# Patient Record
Sex: Female | Born: 1965 | Race: White | Hispanic: No | Marital: Single | Smoking: Former smoker
Health system: Southern US, Community
[De-identification: ages and names within clinical notes are randomized; demographics above are authoritative.]

## PROBLEM LIST (undated history)

## (undated) DIAGNOSIS — K5790 Diverticulosis of intestine, part unspecified, without perforation or abscess without bleeding: Secondary | ICD-10-CM

## (undated) DIAGNOSIS — I5030 Unspecified diastolic (congestive) heart failure: Secondary | ICD-10-CM

## (undated) DIAGNOSIS — I509 Heart failure, unspecified: Secondary | ICD-10-CM

## (undated) DIAGNOSIS — Z9981 Dependence on supplemental oxygen: Secondary | ICD-10-CM

## (undated) DIAGNOSIS — E119 Type 2 diabetes mellitus without complications: Secondary | ICD-10-CM

## (undated) DIAGNOSIS — IMO0001 Reserved for inherently not codable concepts without codable children: Secondary | ICD-10-CM

## (undated) DIAGNOSIS — K635 Polyp of colon: Secondary | ICD-10-CM

## (undated) DIAGNOSIS — J45909 Unspecified asthma, uncomplicated: Secondary | ICD-10-CM

## (undated) DIAGNOSIS — K8020 Calculus of gallbladder without cholecystitis without obstruction: Secondary | ICD-10-CM

## (undated) DIAGNOSIS — I700 Atherosclerosis of aorta: Secondary | ICD-10-CM

## (undated) DIAGNOSIS — I10 Essential (primary) hypertension: Secondary | ICD-10-CM

## (undated) DIAGNOSIS — E785 Hyperlipidemia, unspecified: Secondary | ICD-10-CM

## (undated) DIAGNOSIS — Z6841 Body Mass Index (BMI) 40.0 and over, adult: Secondary | ICD-10-CM

## (undated) DIAGNOSIS — R918 Other nonspecific abnormal finding of lung field: Secondary | ICD-10-CM

## (undated) DIAGNOSIS — J449 Chronic obstructive pulmonary disease, unspecified: Secondary | ICD-10-CM

## (undated) DIAGNOSIS — I2510 Atherosclerotic heart disease of native coronary artery without angina pectoris: Secondary | ICD-10-CM

## (undated) DIAGNOSIS — Z7982 Long term (current) use of aspirin: Secondary | ICD-10-CM

## (undated) DIAGNOSIS — E662 Morbid (severe) obesity with alveolar hypoventilation: Secondary | ICD-10-CM

## (undated) DIAGNOSIS — K760 Fatty (change of) liver, not elsewhere classified: Secondary | ICD-10-CM

## (undated) HISTORY — DX: Type 2 diabetes mellitus without complications: E11.9

## (undated) HISTORY — DX: Hyperlipidemia, unspecified: E78.5

## (undated) HISTORY — DX: Body Mass Index (BMI) 40.0 and over, adult: Z684

## (undated) HISTORY — DX: Unspecified diastolic (congestive) heart failure: I50.30

## (undated) HISTORY — DX: Essential (primary) hypertension: I10

## (undated) HISTORY — DX: Polyp of colon: K63.5

## (undated) HISTORY — DX: Morbid (severe) obesity due to excess calories: E66.01

## (undated) HISTORY — PX: WISDOM TOOTH EXTRACTION: SHX21

## (undated) HISTORY — PX: BREAST BIOPSY: SHX20

---

## 2013-12-28 HISTORY — PX: OTHER SURGICAL HISTORY: SHX169

## 2014-01-18 ENCOUNTER — Ambulatory Visit (INDEPENDENT_AMBULATORY_CARE_PROVIDER_SITE_OTHER): Payer: Managed Care, Other (non HMO) | Admitting: General Surgery

## 2014-01-18 ENCOUNTER — Other Ambulatory Visit: Payer: Managed Care, Other (non HMO)

## 2014-01-18 ENCOUNTER — Encounter: Payer: Self-pay | Admitting: General Surgery

## 2014-01-18 VITALS — BP 174/84 | HR 92 | Resp 18 | Wt 291.0 lb

## 2014-01-18 DIAGNOSIS — R221 Localized swelling, mass and lump, neck: Secondary | ICD-10-CM

## 2014-01-18 DIAGNOSIS — R220 Localized swelling, mass and lump, head: Secondary | ICD-10-CM

## 2014-01-18 NOTE — Progress Notes (Addendum)
Patient ID: Martha Woodard, female   DOB: 02-29-1966, 48 y.o.   MRN: [id number]  Chief Complaint  Patient presents with  . Other    thyroid goiter    HPI Martha Woodard is a 48 y.o. female who presents for an evaluation of a thyroid goiter. The patient states she noticed knots approximately 1 year ago. The knots are located on the front and back of the neck. She denies any pain in these areas. She denies having problems with swallowing. It had gotten larger shortly after she noticed it but has stayed stable in the last year. She has a brother who has had his thyroid removed. Her mother has a history goiter. There is no history of head or neck radiation. The patient has appreciated no change in the texture of her hair. No changes in her voice.   The posterior neck nodules in the right anterior neck nodule were appreciated simultaneously. It was an incidental finding as the patient was rubbing her neck.  HPI  Past Medical History  Diagnosis Date  . Sinus problem     Past Surgical History  Procedure Laterality Date  . Wisdom tooth extraction      Family History  Problem Relation Age of Onset  . Cancer Mother 39    uterian and ovarian   . Goiter Mother   . Stroke Father   . COPD Sister   . Stroke Brother     Social History History  Substance Use Topics  . Smoking status: Former Smoker -- 33 years  . Smokeless tobacco: Not on file  . Alcohol Use: No    Allergies  Allergen Reactions  . Augmentin [Amoxicillin-Pot Clavulanate] Itching    Current Outpatient Prescriptions  Medication Sig Dispense Refill  . DiphenhydrAMINE HCl (ALLERGY MED PO) Take 1-2 capsules by mouth every 4 (four) hours as needed.      Marland Kitchen ibuprofen (ADVIL,MOTRIN) 200 MG tablet Take 200 mg by mouth every 6 (six) hours as needed.      . Multiple Vitamin (MULTIVITAMIN) tablet Take 1 tablet by mouth daily.      . VENTOLIN HFA 108 (90 BASE) MCG/ACT inhaler Inhale 2 puffs into the lungs every 4 (four) hours as  needed.       No current facility-administered medications for this visit.    Review of Systems Review of Systems  Constitutional: Negative.   Respiratory: Negative.   Cardiovascular: Negative.     Blood pressure 174/84, pulse 92, resp. rate 18, weight 291 lb (131.997 kg).  Physical Exam Physical Exam  Constitutional: She is oriented to person, place, and time. She appears well-developed and well-nourished.  Neck:    Little thickening on the right side of neck.   Cardiovascular: Normal rate and regular rhythm.   No murmur heard. Pulmonary/Chest: Effort normal and breath sounds normal.  Neurological: She is alert and oriented to person, place, and time.  Skin: Skin is warm and dry.    Data Reviewed Laboratory studies dated December 24, 2013 showed a hemoglobin of 14.5 with MCV of 89, white blood cell count 7400 with normal differential. TSH 0.6 a day. Free T4-1 0.12 (0.82-1.77); T3-120(71-180). Beta hCG less than one.  PCP note of December 23, 2013 were reviewed.  Ultrasound examination of the anterior neck was completed with the patient was semierect position and the neck hyperextended. The area of palpable fullness in the right neck appears to be related to an anechoic/hypoechoic structure measuring 1.7 x 2.1 x 3.0 cm.  This appears to be slightly superior and lateral to the right lobe of the thyroid. Focal areas of increased vascular flow are noted. The area appears to have septations.  The left thyroid lobe measured 1.9 x 1.9 x 3.8 cm. A small cyst measuring 0.3 x 0.5 x 0.8 cm is noted within the central body. A small hypoechoic area measuring 1 cm noted the inferior pole of the left side. The right lobe of the thyroid appears to measure 1.8 x 2.1 x 2.6 cm.  Assessment    Complex cystic mass superior lateral to the right lobe of the thyroid.     Plan    CT scan to better define the anatomy has been recommended.     Patient is scheduled for a CT of the neck with contrast at  [facility] for 01/20/14 at 11:00 am. She is to arrive by 10:45 am, no prep is needed. Patient is aware of date, time, and instructions.   PCP and Ref. MD: Vonzella Nipple Sipriano Fendley 01/19/2014, 8:29 PM   Addendum to correct typographical and right/ left errors.

## 2014-01-18 NOTE — Patient Instructions (Addendum)
Patient to be arranged for a CT scan of the neck. Follow up to be determined. The patient is aware to call back for any questions or concerns.  Patient is scheduled for a CT of the neck with contrast at [facility] for 01/20/14 at 11:00 am. She is to arrive by 10:45 am, no prep is needed. Patient is aware of date, time, and instructions.

## 2014-01-19 DIAGNOSIS — R221 Localized swelling, mass and lump, neck: Secondary | ICD-10-CM | POA: Insufficient documentation

## 2014-01-22 ENCOUNTER — Telehealth: Payer: Self-pay | Admitting: General Surgery

## 2014-01-22 NOTE — Telephone Encounter (Signed)
Message left regarding CT scan of the neck completed 01/20/2014 to assess the nodular area identified a clinical exam and ultrasound. She will need to have an FNA completed.  Incidental finding was a 9 mm right upper lobe nodule. We'll need to discuss having a dedicated CT scan of the chest based on her past smoking history.

## 2014-02-03 ENCOUNTER — Ambulatory Visit: Payer: Managed Care, Other (non HMO) | Admitting: General Surgery

## 2014-02-06 ENCOUNTER — Ambulatory Visit (INDEPENDENT_AMBULATORY_CARE_PROVIDER_SITE_OTHER): Payer: Managed Care, Other (non HMO)

## 2014-02-06 ENCOUNTER — Ambulatory Visit (INDEPENDENT_AMBULATORY_CARE_PROVIDER_SITE_OTHER): Payer: Managed Care, Other (non HMO) | Admitting: General Surgery

## 2014-02-06 ENCOUNTER — Other Ambulatory Visit: Payer: Managed Care, Other (non HMO)

## 2014-02-06 ENCOUNTER — Encounter: Payer: Self-pay | Admitting: General Surgery

## 2014-02-06 VITALS — BP 140/78 | HR 89 | Resp 14 | Ht 64.0 in | Wt 286.0 lb

## 2014-02-06 DIAGNOSIS — E079 Disorder of thyroid, unspecified: Secondary | ICD-10-CM

## 2014-02-06 DIAGNOSIS — E041 Nontoxic single thyroid nodule: Secondary | ICD-10-CM

## 2014-02-06 DIAGNOSIS — R911 Solitary pulmonary nodule: Secondary | ICD-10-CM | POA: Insufficient documentation

## 2014-02-06 HISTORY — PX: BIOPSY THYROID: PRO38

## 2014-02-06 NOTE — Progress Notes (Signed)
Patient ID: Martha Woodard, female   DOB: 1965-11-18, 48 y.o.   MRN: [id number]  Chief Complaint  Patient presents with  . Procedure    thyroid fna    HPI Martha Woodard is a 48 y.o. female here today for a thyroid FNA. CT scan of the neck completed on January 20, 2014 showed the nodular areas previously identified on ultrasound, but no other neck pathology. An incidental finding was a 9 mm nodule in the right upper lobe which dedicated CT was recommended. HPI  Past Medical History  Diagnosis Date  . Sinus problem     Past Surgical History  Procedure Laterality Date  . Wisdom tooth extraction      Family History  Problem Relation Age of Onset  . Cancer Mother 62    uterian and ovarian   . Goiter Mother   . Stroke Father   . COPD Sister   . Stroke Brother     Social History History  Substance Use Topics  . Smoking status: Former Smoker -- 33 years  . Smokeless tobacco: Not on file  . Alcohol Use: No    Allergies  Allergen Reactions  . Augmentin [Amoxicillin-Pot Clavulanate] Itching    Current Outpatient Prescriptions  Medication Sig Dispense Refill  . DiphenhydrAMINE HCl (ALLERGY MED PO) Take 1-2 capsules by mouth every 4 (four) hours as needed.      Marland Kitchen ibuprofen (ADVIL,MOTRIN) 200 MG tablet Take 200 mg by mouth every 6 (six) hours as needed.      Marland Kitchen lisinopril (PRINIVIL,ZESTRIL) 10 MG tablet Take 1 tablet by mouth daily.      . metFORMIN (GLUCOPHAGE) 500 MG tablet Take 1 tablet by mouth daily.      . Multiple Vitamin (MULTIVITAMIN) tablet Take 1 tablet by mouth daily.      . VENTOLIN HFA 108 (90 BASE) MCG/ACT inhaler Inhale 2 puffs into the lungs every 4 (four) hours as needed.       No current facility-administered medications for this visit.    Review of Systems Review of Systems  Constitutional: Negative.   Respiratory: Negative.   Cardiovascular: Negative.     Blood pressure 140/78, pulse 89, resp. rate 14, height 5\' 4"  (1.626 m), weight 286 lb (129.729  kg).  Physical Exam Physical Exam Fullness involving the right side of the neck is again identified    Data Reviewed Ultrasound examination again showed a dominant hypoechoic area in the superior pole right lobe of the thyroid measuring 1.8 x 2.1 x 3.1 cm. Making use of 1 cc of 1% plain Xylocaine area was aspirated with return of 4 cc of tea colored fluid consistent with colloid. There was a modest decrease in size of the dominant cystic lesion post aspiration. A second cystic structure inferior to this was not aspirated on today's exam.  She tolerated the procedure well.  She reported swallowing was more comfortable post aspiration.  Assessment    Thyroid cyst. Partial resolution of aspiration.  Incidental identification of a 9 mm solid nodule in the right upper lobe.        Plan    The patient will be contacted when the cytology results are available. At that time we'll review the indication for a dedicated chest CT.        Forest Gleason Dwan Fennel 02/06/2014, 9:48 PM

## 2014-02-06 NOTE — Patient Instructions (Signed)
Follow up appointment to be announced.  

## 2014-02-07 LAB — CYTOLOGY - NON PAP

## 2014-02-08 ENCOUNTER — Telehealth: Payer: Self-pay | Admitting: *Deleted

## 2014-02-08 NOTE — Telephone Encounter (Signed)
Notified patient as instructed, patient pleased. Discussed follow-up appointments, patient agrees. Placed in recalls.  

## 2014-02-08 NOTE — Telephone Encounter (Signed)
Message copied by Carson Myrtle on Wed Feb 08, 2014  5:09 PM ------      Message from: [place], [place] W      Created: Wed Feb 08, 2014  1:56 PM       Please notify the patient that the lab report was fine. No evidence of thyroid cancer.  Will plan for a 3 month follow up exam, earlier if she notices the area recurs. Discourage massage of the neck. Thanks.      ----- Message -----         From: [facility] Lab Results In Interface         Sent: 02/07/2014   4:39 PM           To: Robert Bellow, MD                   ------

## 2014-02-09 ENCOUNTER — Telehealth: Payer: Self-pay

## 2014-02-09 DIAGNOSIS — R911 Solitary pulmonary nodule: Secondary | ICD-10-CM

## 2014-02-09 NOTE — Telephone Encounter (Signed)
Message copied by Lesly Rubenstein on Thu Feb 09, 2014  5:03 PM ------      Message from: [place], [place] W      Created: Thu Feb 09, 2014  3:48 PM       Patient needs a contrast CT of the chest to evaluate a small nodule in the right upper lung identified when she had the CT of the thyroid.  Thanks. ------

## 2014-02-09 NOTE — Telephone Encounter (Signed)
Patient has been scheduled for a CT of the chest with contrast at [facility] on 02/17/14 at 10:00 am. She will have a Bun and Creatinine level drawn. She will hold her Metformin for 24 hours prior. Patient is aware of date, time, and all instructions.

## 2014-02-11 LAB — BUN+CREAT
BUN / CREAT RATIO: 13 (ref 9–23)
BUN: 8 mg/dL (ref 6–24)
Creatinine, Ser: 0.63 mg/dL (ref 0.57–1.00)
GFR calc Af Amer: 122 mL/min/{1.73_m2} (ref >59)
GFR calc non Af Amer: 106 mL/min/{1.73_m2} (ref >59)

## 2014-02-14 ENCOUNTER — Other Ambulatory Visit: Payer: Self-pay | Admitting: General Surgery

## 2014-02-14 DIAGNOSIS — E079 Disorder of thyroid, unspecified: Secondary | ICD-10-CM

## 2014-02-21 ENCOUNTER — Encounter: Payer: Self-pay | Admitting: General Surgery

## 2014-02-22 ENCOUNTER — Telehealth: Payer: Self-pay

## 2014-02-22 NOTE — Telephone Encounter (Signed)
Patient has been scheduled to see Dr Nestor Lewandowsky at [facility] on 02/23/14 at 3:00 pm. Patient is aware of date and time.

## 2014-02-22 NOTE — Telephone Encounter (Signed)
Message copied by Lesly Rubenstein on Wed Feb 22, 2014 12:01 PM ------      Message from: [place], [place] W      Created: Wed Feb 22, 2014  9:21 AM       Please arrange an appt w/ Nestor Lewandowsky, MD (thoracic surgeon) at [facility]. Patient is expecting your call.       ----- Message -----         From: Darrin Nipper, CMA         Sent: 02/21/2014   1:30 PM           To: Robert Bellow, MD                   ------

## 2014-02-23 ENCOUNTER — Ambulatory Visit: Payer: Self-pay | Admitting: Cardiothoracic Surgery

## 2014-05-10 ENCOUNTER — Other Ambulatory Visit: Payer: Managed Care, Other (non HMO)

## 2014-05-10 ENCOUNTER — Ambulatory Visit (INDEPENDENT_AMBULATORY_CARE_PROVIDER_SITE_OTHER): Payer: Managed Care, Other (non HMO) | Admitting: General Surgery

## 2014-05-10 ENCOUNTER — Encounter: Payer: Self-pay | Admitting: General Surgery

## 2014-05-10 VITALS — BP 164/82 | HR 94 | Resp 16 | Ht 64.0 in | Wt 288.0 lb

## 2014-05-10 DIAGNOSIS — E041 Nontoxic single thyroid nodule: Secondary | ICD-10-CM

## 2014-05-10 NOTE — Patient Instructions (Signed)
The patient is aware to call back for any questions or concerns.  

## 2014-05-10 NOTE — Progress Notes (Addendum)
Patient ID: Martha Woodard, female   DOB: 10-28-1965, 48 y.o.   MRN: [id number]  Chief Complaint  Patient presents with  . Follow-up    thyroid    HPI Martha Woodard is a 48 y.o. female.  Here today for her thyroid follow up. She states she seems to have more of a cough since the right thyroid biopsy was done on 02-06-14. She does feel like there is "something hung" in her throat. She says that liquids and food both bother her. She noticed significant improvement for the first week after cyst aspiration of the time of her last visit, but since then she is back to baseline. This has not resulted in any weight loss unfortunately.  She did go see Dr. Genevive Bi and she plans on getting another CT scan on May 23 2014 and with Dr Genevive Bi 05-25-14.   HPI  Past Medical History  Diagnosis Date  . Sinus problem     Past Surgical History  Procedure Laterality Date  . Wisdom tooth extraction    . Biopsy thyroid Right 02-06-14    benign    Family History  Problem Relation Age of Onset  . Cancer Mother 58    uterian and ovarian   . Goiter Mother   . Stroke Father   . COPD Sister   . Stroke Brother     Social History History  Substance Use Topics  . Smoking status: Former Smoker -- 33 years  . Smokeless tobacco: Not on file  . Alcohol Use: No    Allergies  Allergen Reactions  . Augmentin [Amoxicillin-Pot Clavulanate] Itching    Current Outpatient Prescriptions  Medication Sig Dispense Refill  . DiphenhydrAMINE HCl (ALLERGY MED PO) Take 1-2 capsules by mouth every 4 (four) hours as needed.      . fluticasone (FLONASE) 50 MCG/ACT nasal spray Place 1 spray into both nostrils daily.       Marland Kitchen ibuprofen (ADVIL,MOTRIN) 200 MG tablet Take 200 mg by mouth every 6 (six) hours as needed.      Marland Kitchen lisinopril (PRINIVIL,ZESTRIL) 20 MG tablet Take 20 mg by mouth daily.      . metFORMIN (GLUCOPHAGE) 500 MG tablet Take 1 tablet by mouth daily.      . Multiple Vitamin (MULTIVITAMIN) tablet Take 1 tablet  by mouth daily.      . VENTOLIN HFA 108 (90 BASE) MCG/ACT inhaler Inhale 2 puffs into the lungs every 4 (four) hours as needed.       No current facility-administered medications for this visit.    Review of Systems Review of Systems  Constitutional: Negative.   Respiratory: Positive for cough.   Cardiovascular: Negative.     Blood pressure 164/82, pulse 94, resp. rate 16, height 5\' 4"  (1.626 m), weight 288 lb (130.636 kg), last menstrual period 04/10/2014, SpO2 93.00%.  Physical Exam Physical Exam  Constitutional: She is oriented to person, place, and time. She appears well-developed and well-nourished.  Neck:  Fullness upper right pole   Cardiovascular: Normal rate, regular rhythm and normal heart sounds.   Pulmonary/Chest: Effort normal and breath sounds normal.  Lymphadenopathy:    She has no cervical adenopathy.  Neurological: She is alert and oriented to person, place, and time.  Skin: Skin is warm and dry.    Data Reviewed Ultrasound examination of the thyroid was completed. The right lobe again shows a 1.6 x 2.6 x 3.1 cm cystlike structure involving the upper pole.  A single nodule was noted in  the left lobe of the thyroid and aspiration was completed using 1 cc of 1% plain Xylocaine. The lesion was traversed multiple times and only a scant amount of material was obtained.  The dictation below is incorrect, recorded on the wrong patient: (2 nodules within the left lobe were identified. The more superior lesion measuring 1.2 x 1.6 x 2.1 cm. Using 1 cc of 1% plain Xylocaine multiple passes of the lesion were completed 4 slides prepared.  The inferior pole lesion was approached in a similar fashion. Initial sample was scant in the area was re-sampled with slides x2 prepared.)  Assessment    Multiple thyroid nodules. Multiple pulmonary nodules identified on prior CT.     Plan    The patient is scheduled for repeat CT later this month and follow up with Dr. Genevive Bi from  thoracic surgery at that time. We'll plan for reassessment after Dr. Genevive Bi upcoming assessment.        PCP: Dicky Doe Dr. Lauree Chandler, Forest Gleason 05/11/2014, 9:04 PM

## 2014-05-11 ENCOUNTER — Encounter: Payer: Self-pay | Admitting: General Surgery

## 2014-05-11 DIAGNOSIS — E041 Nontoxic single thyroid nodule: Secondary | ICD-10-CM | POA: Insufficient documentation

## 2014-05-12 LAB — FINE-NEEDLE ASPIRATION

## 2014-05-18 ENCOUNTER — Telehealth: Payer: Self-pay | Admitting: *Deleted

## 2014-05-18 NOTE — Telephone Encounter (Signed)
Message copied by Carson Myrtle on Thu May 18, 2014 10:54 AM ------      Message from: [place], Forest Gleason      Created: Wed May 17, 2014  8:59 PM       Please notify the patient that there was very little material for the pathology Department to analyze. Will reassess her after her upcoming repeat chest CT. Please be sure she has appointment after her next scheduled followup with Dr. Genevive Bi.      ----- Message -----         From: [facility] Lab Results In Interface         Sent: 05/12/2014   4:39 PM           To: Robert Bellow, MD                   ------

## 2014-05-18 NOTE — Telephone Encounter (Signed)
Chest CT is Tue 05-23-14, appt Dr Genevive Bi 05-25-14. Appointment made for Dr Bary Castilla is 06-01-14, pt agrees.

## 2014-05-25 ENCOUNTER — Ambulatory Visit: Payer: Self-pay | Admitting: Cardiothoracic Surgery

## 2014-05-30 ENCOUNTER — Ambulatory Visit: Payer: Self-pay | Admitting: Cardiothoracic Surgery

## 2014-06-01 ENCOUNTER — Ambulatory Visit: Payer: Self-pay | Admitting: General Surgery

## 2014-06-19 ENCOUNTER — Encounter: Payer: Self-pay | Admitting: General Surgery

## 2014-06-19 ENCOUNTER — Ambulatory Visit (INDEPENDENT_AMBULATORY_CARE_PROVIDER_SITE_OTHER): Payer: Managed Care, Other (non HMO) | Admitting: General Surgery

## 2014-06-19 VITALS — BP 146/80 | HR 76 | Resp 16 | Ht 64.0 in | Wt 282.0 lb

## 2014-06-19 DIAGNOSIS — E041 Nontoxic single thyroid nodule: Secondary | ICD-10-CM

## 2014-06-19 DIAGNOSIS — R911 Solitary pulmonary nodule: Secondary | ICD-10-CM

## 2014-06-19 NOTE — Progress Notes (Signed)
Patient ID: Martha Woodard, female   DOB: 05-21-1966, 48 y.o.   MRN: [id number]  Chief Complaint  Patient presents with  . Follow-up    thyroid    HPI Martha Woodard is a 48 y.o. female here today following up thyroid. Patient had her CT done 05/23/14. She states had strep throat on 05/27/14 and feels like she is short of breath. . Patient states still having problems with swallowing, more and awareness during swallowing than true dysphasia. No weight loss.  HPI  Past Medical History  Diagnosis Date  . Sinus problem     Past Surgical History  Procedure Laterality Date  . Wisdom tooth extraction    . Biopsy thyroid Right 02-06-14    NEGATIVE FOR MALIGNANT CELLS proteinaceous material and macrophages.  . Thryoid fna Right April 2015    Proteinaceous material and macrophages.    Family History  Problem Relation Age of Onset  . Cancer Mother 51    uterian and ovarian   . Goiter Mother   . Stroke Father   . COPD Sister   . Stroke Brother   . Heart murmur Sister   .       Social History History  Substance Use Topics  . Smoking status: Former Smoker -- 33 years  . Smokeless tobacco: Not on file  . Alcohol Use: No    Allergies  Allergen Reactions  . Augmentin [Amoxicillin-Pot Clavulanate] Itching    Current Outpatient Prescriptions  Medication Sig Dispense Refill  . DiphenhydrAMINE HCl (ALLERGY MED PO) Take 1-2 capsules by mouth every 4 (four) hours as needed.      . fluticasone (FLONASE) 50 MCG/ACT nasal spray Place 1 spray into both nostrils daily.       Marland Kitchen ibuprofen (ADVIL,MOTRIN) 200 MG tablet Take 200 mg by mouth every 6 (six) hours as needed.      Marland Kitchen lisinopril (PRINIVIL,ZESTRIL) 20 MG tablet Take 20 mg by mouth daily.      . metFORMIN (GLUCOPHAGE) 500 MG tablet Take 1 tablet by mouth daily.      . Multiple Vitamin (MULTIVITAMIN) tablet Take 1 tablet by mouth daily.      . VENTOLIN HFA 108 (90 BASE) MCG/ACT inhaler Inhale 2 puffs into the lungs every 4 (four) hours  as needed.       No current facility-administered medications for this visit.    Review of Systems Review of Systems  Constitutional: Negative.   Respiratory: Positive for shortness of breath. Negative for apnea, cough, choking, chest tightness, wheezing and stridor.   Cardiovascular: Negative.     Blood pressure 146/80, pulse 76, resp. rate 16, height 5\' 4"  (1.626 m), weight 282 lb (127.914 kg).  Physical Exam Physical Exam  Constitutional: She is oriented to person, place, and time. She appears well-developed and well-nourished.  Neck: Mass (well-defined nodular area on the right superior pole, unchanged from April exam prior to aspiration. No tenderness. Moves easily with deglutition.) present.    Cardiovascular: Normal rate.  An irregular rhythm present.  Pulmonary/Chest: Effort normal and breath sounds normal.  Neurological: She is alert and oriented to person, place, and time.  Skin: Skin is warm and dry.    Data Reviewed Chest CT from 05/23/2014. One small new nodule, one year followup recommended.  Previous left cytology insufficient cells, blood only.  Original right cyst fluid cytology macrophages and proteinaceous material.  Assessment    Mildly symptomatic thyroid cyst without evidence of malignancy.    Plan  Options for management were reviewed: 1) continued observation versus 2) right thyroid lobectomy.  Surgery is not high on the patient's list at this time. Will discuss with ENT service of other options are available. She will be contacted by phone after this discussion occurs. If surgery is not planned, would reassess with ultrasound in 6 months.    PCP and Ref. MD: Larene Beach     Robert Bellow 06/22/2014, 10:14 AM

## 2014-06-22 ENCOUNTER — Telehealth: Payer: Self-pay | Admitting: General Surgery

## 2014-06-22 ENCOUNTER — Encounter: Payer: Self-pay | Admitting: General Surgery

## 2014-06-22 NOTE — Assessment & Plan Note (Signed)
Patient evaluated by Nestor Lewandowsky, M.D. Stable 3 months CT, one year followup plan.

## 2014-06-22 NOTE — Telephone Encounter (Signed)
The recurrence of the thyroid cyst was reviewed with the ENT service. This is the natural history of these lesions. Some had had a response with multiple aspirations, but most recur. It's unlikely with the anterior location that this is putting any pressure on the esophagus. As the area does move with deglutition, I suspected it is the sensation of the lesion moving under the platysmas muscle at the patient is experiencing rather than esophageal compression.  Options at this time include 1) observation with a 6 month followup exam versus 2) right thyroidectomy.  The patient brought up the possibility of taking out both sides of the thyroid, I explained if this does not risk of complications to the recurrent laryngeal nerve as well as the parathyroid glands.  The patient is scheduled to meet with the cardiology service on September 28.  We'll plan for followup examination 6 months, but she was encouraged to call earlier should she desire to proceed with surgical intervention or have any new issues.  She will be having a followup chest CT in one year with the cardiothoracic service in regards for multiple pulmonary nodules previously identified.

## 2014-06-26 ENCOUNTER — Ambulatory Visit (INDEPENDENT_AMBULATORY_CARE_PROVIDER_SITE_OTHER): Payer: Medicare HMO | Admitting: Cardiovascular Disease

## 2014-06-26 ENCOUNTER — Encounter: Payer: Self-pay | Admitting: Cardiovascular Disease

## 2014-06-26 ENCOUNTER — Encounter (INDEPENDENT_AMBULATORY_CARE_PROVIDER_SITE_OTHER): Payer: Self-pay

## 2014-06-26 VITALS — BP 122/78 | HR 107 | Ht 64.0 in | Wt 282.5 lb

## 2014-06-26 DIAGNOSIS — E785 Hyperlipidemia, unspecified: Secondary | ICD-10-CM | POA: Insufficient documentation

## 2014-06-26 DIAGNOSIS — E782 Mixed hyperlipidemia: Secondary | ICD-10-CM | POA: Insufficient documentation

## 2014-06-26 DIAGNOSIS — I491 Atrial premature depolarization: Secondary | ICD-10-CM

## 2014-06-26 DIAGNOSIS — E1169 Type 2 diabetes mellitus with other specified complication: Secondary | ICD-10-CM | POA: Insufficient documentation

## 2014-06-26 DIAGNOSIS — E079 Disorder of thyroid, unspecified: Secondary | ICD-10-CM

## 2014-06-26 DIAGNOSIS — R000 Tachycardia, unspecified: Secondary | ICD-10-CM | POA: Insufficient documentation

## 2014-06-26 DIAGNOSIS — E118 Type 2 diabetes mellitus with unspecified complications: Secondary | ICD-10-CM | POA: Insufficient documentation

## 2014-06-26 DIAGNOSIS — I498 Other specified cardiac arrhythmias: Secondary | ICD-10-CM

## 2014-06-26 DIAGNOSIS — E119 Type 2 diabetes mellitus without complications: Secondary | ICD-10-CM | POA: Insufficient documentation

## 2014-06-26 DIAGNOSIS — Z87891 Personal history of nicotine dependence: Secondary | ICD-10-CM

## 2014-06-26 DIAGNOSIS — R002 Palpitations: Secondary | ICD-10-CM | POA: Insufficient documentation

## 2014-06-26 DIAGNOSIS — I499 Cardiac arrhythmia, unspecified: Secondary | ICD-10-CM

## 2014-06-26 DIAGNOSIS — R0602 Shortness of breath: Secondary | ICD-10-CM

## 2014-06-26 MED ORDER — METOPROLOL SUCCINATE ER 25 MG PO TB24: 25.0000 mg | ORAL_TABLET | Freq: Every day | ORAL | Status: DC

## 2014-06-26 MED ORDER — ATORVASTATIN CALCIUM 10 MG PO TABS: 10.0000 mg | ORAL_TABLET | Freq: Every day | ORAL | Status: DC

## 2014-06-26 NOTE — Assessment & Plan Note (Signed)
Greater than 30 year smoking history. She denies any symptoms concerning for COPD

## 2014-06-26 NOTE — Assessment & Plan Note (Signed)
LDL above goal. We have recommended she start Lipitor 10 mg daily. Encouraged weight loss and strict diet

## 2014-06-26 NOTE — Assessment & Plan Note (Signed)
She has followup with Dr. Bary Castilla

## 2014-06-26 NOTE — Assessment & Plan Note (Signed)
APCs seen on EKG and heard on clinical exam. We'll start low-dose beta blocker. Otherwise no significant workup needed

## 2014-06-26 NOTE — Assessment & Plan Note (Signed)
We have encouraged continued exercise, careful diet management in an effort to lose weight. 

## 2014-06-26 NOTE — Patient Instructions (Signed)
You are doing well. You are having APCs  Please start atorvastatin 10 mg once a day for cholesterol Please start metoprolol one a day in the morning for fast heart rate Please cut the lisinopril in 1/2 daily (pm)  Please call us if you have new issues that need to be addressed before your next appt.

## 2014-06-26 NOTE — Assessment & Plan Note (Signed)
She has rare palpitations likely secondary to her APCs. We'll start her on low-dose beta blocker, metoprolol succinate 25 mg daily. This will also help with her tachycardia

## 2014-06-26 NOTE — Progress Notes (Signed)
Patient ID: Martha Woodard, female    DOB: 07/02/1966, 48 y.o.   MRN: [id number]  HPI Comments: Martha Woodard is a very pleasant 48 year old woman with obesity, diabetes type 2, hypertension who presents for evaluation of arrhythmia..  Reports that she has a thyroid cyst and has been seen by Dr. Marlyn Corporal with recent aspiration. On clinical exam, she had an abnormal heartbeat. It was recommended that she followup for evaluation.  She does report occasional palpitations.Marland Kitchen denies having any significant shortness of breath, no chest pain. She is a prior smoker, smoked for 33 years but has since stopped. She does have some occasional nasal congestion. She reports having difficulty with her weight. No regular exercise program. She loves [place] Recent hemoglobin A1c 6.8. Total cholesterol 206, LDL 112, TSH 0.68 Prior hemoglobin A1c was 8 She does report having occasional orthostatic type symptoms.  Prior CT scan showing lung nodules, followed by Dr. Faith Rogue EKG shows sinus tachycardia with rare APCs, no significant ST or T wave changes   Outpatient Encounter Prescriptions as of 06/26/2014  Medication Sig  . DiphenhydrAMINE HCl (ALLERGY MED PO) Take 1-2 capsules by mouth every 4 (four) hours as needed.  . fluticasone (FLONASE) 50 MCG/ACT nasal spray Place 1 spray into both nostrils daily.   Marland Kitchen ibuprofen (ADVIL,MOTRIN) 200 MG tablet Take 200 mg by mouth every 6 (six) hours as needed.  . metFORMIN (GLUCOPHAGE) 500 MG tablet Take 1 tablet by mouth daily.  . Multiple Vitamin (MULTIVITAMIN) tablet Take 1 tablet by mouth daily.  . VENTOLIN HFA 108 (90 BASE) MCG/ACT inhaler Inhale 2 puffs into the lungs every 4 (four) hours as needed.  Marland Kitchen  lisinopril (PRINIVIL,ZESTRIL) 20 MG tablet Take 20 mg by mouth daily.     Review of Systems  Constitutional: Negative.   HENT: Negative.   Eyes: Negative.   Respiratory: Negative.   Cardiovascular: Positive for palpitations.  Gastrointestinal: Negative.   Endocrine:  Negative.   Musculoskeletal: Negative.   Skin: Negative.   Allergic/Immunologic: Negative.   Neurological: Negative.   Hematological: Negative.   Psychiatric/Behavioral: Negative.   All other systems reviewed and are negative.   BP 122/78  Pulse 107  Ht 5\' 4"  (1.626 m)  Wt 282 lb 8 oz (128.141 kg)  BMI 48.47 kg/m2  Physical Exam  Nursing note and vitals reviewed. Constitutional: She is oriented to person, place, and time. She appears well-developed and well-nourished.  HENT:  Head: Normocephalic.  Nose: Nose normal.  Mouth/Throat: Oropharynx is clear and moist.  Eyes: Conjunctivae are normal. Pupils are equal, round, and reactive to light.  Neck: Normal range of motion. Neck supple. No JVD present.  Cardiovascular: Normal rate, regular rhythm, S1 normal, S2 normal, normal heart sounds and intact distal pulses.  Exam reveals no gallop and no friction rub.   No murmur heard. Pulmonary/Chest: Effort normal and breath sounds normal. No respiratory distress. She has no wheezes. She has no rales. She exhibits no tenderness.  Abdominal: Soft. Bowel sounds are normal. She exhibits no distension. There is no tenderness.  Musculoskeletal: Normal range of motion. She exhibits no edema and no tenderness.  Lymphadenopathy:    She has no cervical adenopathy.  Neurological: She is alert and oriented to person, place, and time. Coordination normal.  Skin: Skin is warm and dry. No rash noted. No erythema.  Psychiatric: She has a normal mood and affect. Her behavior is normal. Judgment and thought content normal.    Assessment and Plan

## 2014-06-26 NOTE — Assessment & Plan Note (Signed)
Hemoglobin A1c 6.8 earlier in 2015. Recommended a strict diet, exercise, weight loss

## 2014-06-26 NOTE — Assessment & Plan Note (Signed)
Heart rate 107 beats per minute today. She does not appear to be anxious. Even on clinical exam heart rate stayed high. Review of the office notes from Dr. Luan Pulling shows heart rate in the mid to high 90s. We'll start low-dose beta blocker

## 2014-06-27 ENCOUNTER — Ambulatory Visit: Payer: Self-pay | Admitting: Family Medicine

## 2014-07-25 ENCOUNTER — Encounter: Payer: Self-pay | Admitting: General Surgery

## 2014-07-31 ENCOUNTER — Encounter: Payer: Self-pay | Admitting: Cardiovascular Disease

## 2014-11-13 ENCOUNTER — Telehealth: Payer: Self-pay | Admitting: *Deleted

## 2014-11-13 NOTE — Telephone Encounter (Signed)
Request from [facility] , sent to [place] on 11/13/14 .

## 2014-11-27 ENCOUNTER — Telehealth: Payer: Self-pay | Admitting: *Deleted

## 2014-11-27 NOTE — Telephone Encounter (Signed)
Request from Disability Determination services  , sent to [place] on 11/27/14

## 2015-01-15 DIAGNOSIS — R911 Solitary pulmonary nodule: Secondary | ICD-10-CM

## 2015-01-23 ENCOUNTER — Ambulatory Visit
Admit: 2015-01-23 | Disposition: A | Discharge status: Home / Self Care | Payer: Self-pay | Attending: Obstetrics and Gynecology | Admitting: Obstetrics and Gynecology

## 2015-03-12 ENCOUNTER — Other Ambulatory Visit: Payer: Self-pay | Admitting: Family Medicine

## 2015-03-12 MED ORDER — METFORMIN HCL 500 MG PO TABS: 500.0000 mg | ORAL_TABLET | Freq: Every day | ORAL | Status: DC

## 2015-03-23 ENCOUNTER — Other Ambulatory Visit: Payer: Self-pay | Admitting: Family Medicine

## 2015-03-23 MED ORDER — LOSARTAN POTASSIUM 25 MG PO TABS: 25.0000 mg | ORAL_TABLET | Freq: Every day | ORAL | Status: DC

## 2015-04-11 ENCOUNTER — Other Ambulatory Visit: Payer: Self-pay | Admitting: Family Medicine

## 2015-04-11 ENCOUNTER — Telehealth: Payer: Self-pay | Admitting: Family Medicine

## 2015-04-11 MED ORDER — METFORMIN HCL 500 MG PO TABS: 500.0000 mg | ORAL_TABLET | Freq: Every day | ORAL | Status: DC

## 2015-04-11 NOTE — Telephone Encounter (Signed)
I have sent in #[phone] with no refills.  She needs office appt. F/u.-jh

## 2015-04-11 NOTE — Telephone Encounter (Signed)
Pt needs a refill on metformin 500 mg sent to [facility].

## 2015-04-11 NOTE — Telephone Encounter (Signed)
Has not been seen since September 2015. [facility]

## 2015-04-13 ENCOUNTER — Other Ambulatory Visit: Payer: Self-pay | Admitting: Family Medicine

## 2015-04-13 MED ORDER — METFORMIN HCL 500 MG PO TABS: 500.0000 mg | ORAL_TABLET | Freq: Every day | ORAL | Status: DC

## 2015-04-25 ENCOUNTER — Other Ambulatory Visit: Payer: Self-pay | Admitting: Family Medicine

## 2015-04-25 MED ORDER — LOSARTAN POTASSIUM 25 MG PO TABS: 25.0000 mg | ORAL_TABLET | Freq: Every day | ORAL | Status: DC

## 2015-05-23 ENCOUNTER — Other Ambulatory Visit: Payer: Self-pay | Admitting: *Deleted

## 2015-05-23 MED ORDER — METOPROLOL SUCCINATE ER 25 MG PO TB24: 25.0000 mg | ORAL_TABLET | Freq: Every day | ORAL | Status: DC

## 2015-05-23 MED ORDER — ATORVASTATIN CALCIUM 10 MG PO TABS: 10.0000 mg | ORAL_TABLET | Freq: Every day | ORAL | Status: DC

## 2015-06-11 ENCOUNTER — Ambulatory Visit (INDEPENDENT_AMBULATORY_CARE_PROVIDER_SITE_OTHER): Payer: No Typology Code available for payment source | Admitting: Family Medicine

## 2015-06-11 ENCOUNTER — Emergency Department: Payer: No Typology Code available for payment source

## 2015-06-11 ENCOUNTER — Emergency Department
Admission: EM | Admit: 2015-06-11 | Discharge: 2015-06-11 | Disposition: A | Discharge status: Home / Self Care | Payer: No Typology Code available for payment source | Attending: Emergency Medicine | Admitting: Emergency Medicine

## 2015-06-11 ENCOUNTER — Encounter: Payer: Self-pay | Admitting: Family Medicine

## 2015-06-11 ENCOUNTER — Encounter: Payer: Self-pay | Admitting: Emergency Medicine

## 2015-06-11 VITALS — BP 148/75 | HR 89 | Temp 98.3°F | Resp 16 | Ht 64.0 in | Wt 296.4 lb

## 2015-06-11 DIAGNOSIS — R0689 Other abnormalities of breathing: Secondary | ICD-10-CM

## 2015-06-11 DIAGNOSIS — Z79899 Other long term (current) drug therapy: Secondary | ICD-10-CM | POA: Diagnosis not present

## 2015-06-11 DIAGNOSIS — R7309 Other abnormal glucose: Secondary | ICD-10-CM

## 2015-06-11 DIAGNOSIS — J45901 Unspecified asthma with (acute) exacerbation: Secondary | ICD-10-CM | POA: Diagnosis not present

## 2015-06-11 DIAGNOSIS — Z87891 Personal history of nicotine dependence: Secondary | ICD-10-CM | POA: Insufficient documentation

## 2015-06-11 DIAGNOSIS — R0602 Shortness of breath: Secondary | ICD-10-CM | POA: Diagnosis present

## 2015-06-11 DIAGNOSIS — E119 Type 2 diabetes mellitus without complications: Secondary | ICD-10-CM | POA: Insufficient documentation

## 2015-06-11 DIAGNOSIS — I10 Essential (primary) hypertension: Secondary | ICD-10-CM | POA: Diagnosis not present

## 2015-06-11 DIAGNOSIS — R0902 Hypoxemia: Secondary | ICD-10-CM | POA: Diagnosis not present

## 2015-06-11 DIAGNOSIS — R0600 Dyspnea, unspecified: Secondary | ICD-10-CM

## 2015-06-11 DIAGNOSIS — R69 Illness, unspecified: Secondary | ICD-10-CM | POA: Diagnosis not present

## 2015-06-11 DIAGNOSIS — R739 Hyperglycemia, unspecified: Secondary | ICD-10-CM

## 2015-06-11 HISTORY — DX: Unspecified asthma, uncomplicated: J45.909

## 2015-06-11 LAB — CBC WITH DIFFERENTIAL/PLATELET
BASOS ABS: 0 10*3/uL (ref 0–0.1)
Basophils Relative: 1 %
EOS PCT: 1 %
Eosinophils Absolute: 0.1 10*3/uL (ref 0–0.7)
HCT: 47.2 % — ABNORMAL HIGH (ref 35.0–47.0)
Hemoglobin: 15 g/dL (ref 12.0–16.0)
LYMPHS PCT: 29 %
Lymphs Abs: 2 10*3/uL (ref 1.0–3.6)
MCH: 25.6 pg — ABNORMAL LOW (ref 26.0–34.0)
MCHC: 31.9 g/dL — AB (ref 32.0–36.0)
MCV: 80.5 fL (ref 80.0–100.0)
MONO ABS: 0.7 10*3/uL (ref 0.2–0.9)
MONOS PCT: 11 %
Neutro Abs: 3.9 10*3/uL (ref 1.4–6.5)
Neutrophils Relative %: 58 %
PLATELETS: 254 10*3/uL (ref 150–440)
RBC: 5.86 MIL/uL — ABNORMAL HIGH (ref 3.80–5.20)
RDW: 17.5 % — AB (ref 11.5–14.5)
WBC: 6.7 10*3/uL (ref 3.6–11.0)

## 2015-06-11 LAB — COMPREHENSIVE METABOLIC PANEL
ALBUMIN: 3.6 g/dL (ref 3.5–5.0)
ALT: 26 U/L (ref 14–54)
AST: 32 U/L (ref 15–41)
Alkaline Phosphatase: 63 U/L (ref 38–126)
Anion gap: 9 (ref 5–15)
BILIRUBIN TOTAL: 0.7 mg/dL (ref 0.3–1.2)
BUN: 7 mg/dL (ref 6–20)
CHLORIDE: 94 mmol/L — AB (ref 101–111)
CO2: 34 mmol/L — ABNORMAL HIGH (ref 22–32)
CREATININE: 0.64 mg/dL (ref 0.44–1.00)
Calcium: 8.8 mg/dL — ABNORMAL LOW (ref 8.9–10.3)
GFR calc Af Amer: >60 mL/min (ref >60)
GLUCOSE: 106 mg/dL — AB (ref 65–99)
POTASSIUM: 4 mmol/L (ref 3.5–5.1)
Sodium: 137 mmol/L (ref 135–145)
TOTAL PROTEIN: 7.4 g/dL (ref 6.5–8.1)

## 2015-06-11 LAB — BRAIN NATRIURETIC PEPTIDE: B Natriuretic Peptide: 81 pg/mL (ref 0.0–100.0)

## 2015-06-11 LAB — TROPONIN I

## 2015-06-11 LAB — TSH: TSH: 1.805 u[IU]/mL (ref 0.350–4.500)

## 2015-06-11 MED ORDER — PREDNISONE 20 MG PO TABS: 20.0000 mg | ORAL_TABLET | Freq: Two times a day (BID) | ORAL | Status: DC

## 2015-06-11 MED ORDER — PREDNISONE 20 MG PO TABS
60.0000 mg | ORAL_TABLET | Freq: Once | ORAL | Status: AC
  Administered 2015-06-11: 60 mg via ORAL
  Filled 2015-06-11: qty 3

## 2015-06-11 MED ORDER — ALBUTEROL SULFATE (2.5 MG/3ML) 0.083% IN NEBU: 2.5000 mg | INHALATION_SOLUTION | Freq: Four times a day (QID) | RESPIRATORY_TRACT | Status: DC | PRN

## 2015-06-11 MED ORDER — IPRATROPIUM-ALBUTEROL 0.5-2.5 (3) MG/3ML IN SOLN
3.0000 mL | Freq: Once | RESPIRATORY_TRACT | Status: AC
  Administered 2015-06-11: 3 mL via RESPIRATORY_TRACT

## 2015-06-11 MED ORDER — IOHEXOL 350 MG/ML SOLN
100.0000 mL | Freq: Once | INTRAVENOUS | Status: AC | PRN
  Administered 2015-06-11: 100 mL via INTRAVENOUS

## 2015-06-11 MED ORDER — IPRATROPIUM-ALBUTEROL 0.5-2.5 (3) MG/3ML IN SOLN
3.0000 mL | Freq: Once | RESPIRATORY_TRACT | Status: AC
  Administered 2015-06-11: 3 mL via RESPIRATORY_TRACT
  Filled 2015-06-11: qty 3

## 2015-06-11 NOTE — Discharge Instructions (Signed)
Shortness of Breath Shortness of breath means you have trouble breathing. It could also mean that you have a medical problem. You should get immediate medical care for shortness of breath. CAUSES   Not enough oxygen in the air such as with high altitudes or a smoke-filled room.  Certain lung diseases, infections, or problems.  Heart disease or conditions, such as angina or heart failure.  Low red blood cells (anemia).  Poor physical fitness, which can cause shortness of breath when you exercise.  Chest or back injuries or stiffness.  Being overweight.  Smoking.  Anxiety, which can make you feel like you are not getting enough air. DIAGNOSIS  Serious medical problems can often be found during your physical exam. Tests may also be done to determine why you are having shortness of breath. Tests may include:  Chest X-rays.  Lung function tests.  Blood tests.  An electrocardiogram (ECG).  An ambulatory electrocardiogram. An ambulatory ECG records your heartbeat patterns over a 24-hour period.  Exercise testing.  A transthoracic echocardiogram (TTE). During echocardiography, sound waves are used to evaluate how blood flows through your heart.  A transesophageal echocardiogram (TEE).  Imaging scans. Your health care provider may not be able to find a cause for your shortness of breath after your exam. In this case, it is important to have a follow-up exam with your health care provider as directed.  TREATMENT  Treatment for shortness of breath depends on the cause of your symptoms and can vary greatly. HOME CARE INSTRUCTIONS   Do not smoke. Smoking is a common cause of shortness of breath. If you smoke, ask for help to quit.  Avoid being around chemicals or things that may bother your breathing, such as paint fumes and dust.  Rest as needed. Slowly resume your usual activities.  If medicines were prescribed, take them as directed for the full length of time directed. This  includes oxygen and any inhaled medicines.  Keep all follow-up appointments as directed by your health care provider. SEEK MEDICAL CARE IF:   Your condition does not improve in the time expected.  You have a hard time doing your normal activities even with rest.  You have any new symptoms. SEEK IMMEDIATE MEDICAL CARE IF:   Your shortness of breath gets worse.  You feel light-headed, faint, or develop a cough not controlled with medicines.  You start coughing up blood.  You have pain with breathing.  You have chest pain or pain in your arms, shoulders, or abdomen.  You have a fever.  You are unable to walk up stairs or exercise the way you normally do. MAKE SURE YOU:  Understand these instructions.  Will watch your condition.  Will get help right away if you are not doing well or get worse. Document Released: 06/10/2001 Document Revised: 09/20/2013 Document Reviewed: 12/01/2011 [facility] Patient Information 2015 [place], [place]. This information is not intended to replace advice given to you by your health care provider. Make sure you discuss any questions you have with your health care provider.  Please return immediately if condition worsens. Please contact her primary physician or the physician you were given for referral. If you have any specialist physicians involved in her treatment and plan please also contact them. Thank you for using [place] [facility]. Please follow-up with your primary physician and also Dr. Faith Rogue. Nodules require further outpatient investigation. The shortness of breath etc. presenting with seems to be more COPD oriented. Return emergency department especially for chest  pain, fever or any other new concerns.

## 2015-06-11 NOTE — ED Notes (Signed)
Pt sent over by DR Luan Pulling for further eval of low oxygen sats. Pt denies any chest pain.

## 2015-06-11 NOTE — Patient Instructions (Addendum)
To go to [facility].  O2 level was 83 after DuoNEb treatment.

## 2015-06-11 NOTE — ED Provider Notes (Signed)
Time Seen: Approximately 11 AM  I have reviewed the triage notes  Chief Complaint: Shortness of Breath   History of Present Illness: Martha Woodard is a 49 y.o. female who states that she's had shortness of breath for a very extensive period of time. "" Approximately a year"". Patient was referred from her primary physician's office due to evaluation for low oxygen saturation. Patient is at approximately 83% on arrival here and was placed on a 2 L nasal cannula and stabilized at 95%. She had received a nebulization therapy at her primary physician's office and states that she feels improved. She denies any chest pain. She states that she's had some known nodules in her lungs which were followed by serial CAT scan and she scheduled to have a future CT to repeat the evaluation of her nodules. States that she has a remote smoking history but no personal cardiovascular history. She has noticed some increased swelling in both of her lower extremities. She denies any arm or jaw pain or generalized weakness. She states her shortness of breath improves whenever she "" takes a break "".   Past Medical History  Diagnosis Date  . Sinus problem   . Diabetes mellitus without complication   . Hypertension   . Asthma     Patient Active Problem List   Diagnosis Date Noted  . Sinus tachycardia 06/26/2014  . PAC (premature atrial contraction) 06/26/2014  . Palpitations 06/26/2014  . Smoking history 06/26/2014  . Morbid obesity 06/26/2014  . Diabetes mellitus type 2, controlled, without complications 70/26/3785  . Hyperlipidemia 06/26/2014  . Thyroid nodule 05/11/2014  . Thyroid cyst 02/06/2014  . Thyroid mass 02/06/2014  . Lung nodule seen on imaging study 02/06/2014  . Neck nodule 01/19/2014    Past Surgical History  Procedure Laterality Date  . Wisdom tooth extraction    . Biopsy thyroid Right 02-06-14    NEGATIVE FOR MALIGNANT CELLS proteinaceous material and macrophages.  . Thryoid fna  Right April 2015    Proteinaceous material and macrophages.    Past Surgical History  Procedure Laterality Date  . Wisdom tooth extraction    . Biopsy thyroid Right 02-06-14    NEGATIVE FOR MALIGNANT CELLS proteinaceous material and macrophages.  . Thryoid fna Right April 2015    Proteinaceous material and macrophages.    Current Outpatient Rx  Name  Route  Sig  Dispense  Refill  . atorvastatin (LIPITOR) 10 MG tablet   Oral   Take 1 tablet (10 mg total) by mouth daily.   30 tablet   0   . losartan (COZAAR) 25 MG tablet   Oral   Take 1 tablet (25 mg total) by mouth daily.   14 tablet   0     Patient needs office follow up to continue to get  ...   . albuterol (PROVENTIL) (2.5 MG/3ML) 0.083% nebulizer solution   Nebulization   Take 3 mLs (2.5 mg total) by nebulization every 6 (six) hours as needed for wheezing or shortness of breath.   75 mL   12   . DiphenhydrAMINE HCl (ALLERGY MED PO)   Oral   Take 1-2 capsules by mouth every 4 (four) hours as needed.         . fluticasone (FLONASE) 50 MCG/ACT nasal spray   Each Nare   Place 1 spray into both nostrils daily.          Marland Kitchen ibuprofen (ADVIL,MOTRIN) 200 MG tablet   Oral  Take 200 mg by mouth every 6 (six) hours as needed.         . metFORMIN (GLUCOPHAGE) 500 MG tablet   Oral   Take 1 tablet (500 mg total) by mouth daily.   30 tablet   0     Patient needs office appointment,   . metoprolol succinate (TOPROL-XL) 25 MG 24 hr tablet   Oral   Take 1 tablet (25 mg total) by mouth daily.   30 tablet   0   . Multiple Vitamin (MULTIVITAMIN) tablet   Oral   Take 1 tablet by mouth daily.         . predniSONE (DELTASONE) 20 MG tablet   Oral   Take 1 tablet (20 mg total) by mouth 2 (two) times daily with a meal.   10 tablet   0     Allergies:  Augmentin  Family History: Family History  Problem Relation Age of Onset  . Cancer Mother 10    uterian and ovarian   . Goiter Mother   . Stroke Father    . COPD Sister   . Stroke Brother   . Heart murmur Sister   .       Social History: Social History  Substance Use Topics  . Smoking status: Former Smoker -- 59 years    Quit date: 07/10/2013  . Smokeless tobacco: None  . Alcohol Use: No     Review of Systems:   10 point review of systems was performed and was otherwise negative:  Constitutional: No fever Eyes: No visual disturbances ENT: No sore throat, ear pain Cardiac: No chest pain Respiratory: Patient states she had some wheezing at the physician's office. She was not aware of them at home. Abdomen: No abdominal pain, no vomiting, No diarrhea Endocrine: No weight loss, No night sweats Extremities: No peripheral edema, cyanosis Skin: No rashes, easy bruising Neurologic: No focal weakness, trouble with speech or swollowing Urologic: No dysuria, Hematuria, or urinary frequency   Physical Exam:  ED Triage Vitals  Enc Vitals Group     BP 06/11/15 1049 161/72 mmHg     Pulse Rate 06/11/15 1049 86     Resp 06/11/15 1049 22     Temp 06/11/15 1049 98.1 F (36.7 C)     Temp Source 06/11/15 1049 Oral     SpO2 06/11/15 1049 83 %     Weight 06/11/15 1049 296 lb (134.265 kg)     Height 06/11/15 1049 5\' 4"  (1.626 m)     Head Cir --      Peak Flow --      Pain Score --      Pain Loc --      Pain Edu? --      Excl. in [place]? --     General: Awake , Alert , and Oriented times 3; GCS 15 Head: Normal cephalic , atraumatic Eyes: Pupils equal , round, reactive to light Nose/Throat: No nasal drainage, patent upper airway without erythema or exudate.  Neck: Supple, Full range of motion, No anterior adenopathy or palpable thyroid masses Lungs: Mild and expiratory wheezing heard at the apices symmetrically without rhonchi or rales Heart: Regular rate, regular rhythm without murmurs , gallops , or rubs Abdomen: Soft, non tender without rebound, guarding , or rigidity; bowel sounds positive and symmetric in all 4 quadrants. No  organomegaly .        Extremities: Mild circumferential nonpitting edema. No clubbing or cyanosis. Neurologic: normal ambulation, Motor  symmetric without deficits, sensory intact Skin: warm, dry, no rashes   Labs:   All laboratory work was reviewed including any pertinent negatives or positives listed below:  Labs Reviewed  CBC WITH DIFFERENTIAL/PLATELET - Abnormal; Notable for the following:    RBC 5.86 (*)    HCT 47.2 (*)    MCH 25.6 (*)    MCHC 31.9 (*)    RDW 17.5 (*)    All other components within normal limits  COMPREHENSIVE METABOLIC PANEL - Abnormal; Notable for the following:    Chloride 94 (*)    CO2 34 (*)    Glucose, Bld 106 (*)    Calcium 8.8 (*)    All other components within normal limits  TROPONIN I  TSH  BRAIN NATRIURETIC PEPTIDE   review of the laboratory work showed no significant findings  EKG: ED ECG REPORT I, Daymon Larsen, the attending physician, personally viewed and interpreted this ECG.  Date: 06/11/2015 EKG Time: 1110 Rate: 83 Rhythm: normal sinus rhythm QRS Axis: normal Intervals: normal ST/T Wave abnormalities: normal Conduction Disutrbances: none Narrative Interpretation: unremarkable *   Radiology:    EXAM: CT ANGIOGRAPHY CHEST WITH CONTRAST  TECHNIQUE: Multidetector CT imaging of the chest was performed using the standard protocol during bolus administration of intravenous contrast. Multiplanar CT image reconstructions and MIPs were obtained to evaluate the vascular anatomy.  CONTRAST: 120mL OMNIPAQUE IOHEXOL 350 MG/ML SOLN  COMPARISON: 02/17/2014 from [facility] imaging  FINDINGS: Left arm IV contrast administration. Innominate vein and SVC patent. Satisfactory opacification of pulmonary arteries noted, and there is no evidence of pulmonary emboli. Patent bilateral pulmonary veins. Adequate contrast opacification of the thoracic aorta with no evidence of dissection, aneurysm, or stenosis. There is  classic 3-vessel brachiocephalic arch anatomy without proximal stenosis. There is mild plaque at the left subclavian artery origin.  No pleural or pericardial effusion. No hilar or mediastinal adenopathy. 9 mm nodule, superior segment right lower lobe image 83/6, previously 6 mm. 14 mm nodule medially in the right middle lobe image 92, previously 11 mm. 5 mm nodule in the medial basal segment left lower lobe image 94, previously 4 mm. Additional smaller bilateral lower lobe nodules 4 mm or less, more numerous on right than left.  Spondylitic changes in the mid and lower thoracic spine. visualized portions of upper abdomen unremarkable.  Review of the MIP images confirms the above findings.  IMPRESSION: 1. Negative for acute PE or thoracic aortic dissection. 2. Interval enlargement of lower lobe pulmonary nodules, suggesting metastatic disease more likely than postinfectious/postinflammatory change. PET-CT may be useful for further characterization, as well as to evaluate for potential primary site.      I personally reviewed the radiologic studies   Procedures:  Patient was placed on continuous cardiac monitor remained in normal sinus rhythm. She also had continuous pulse oximetry and states she feels better and her saturations remained at 95% on a 2 L nasal cannula. Patient will be given a DuoNeb had been some oral steroids here in emergency department   ED course: Patient I felt differential was pulmonary embolism, acute coronary syndrome, pulmonary edema, pulmonary hypertension, acute exacerbation of chronic obstructive pulmonary disease given her presentation and there is concern over the increasing size of these nodules in comparison to previous CAT scans. Patient was given a copy of the reading the take with her on an outpatient basis and to follow-up with the thoracic surgeon who initially was reviewing her CAT scans and sequence. Her had  a biopsy and it sounds as though  still nodules had not increased in size at that time. Patient is nodules do not appear to be the size that would give her hypoxia. I felt this most likely was an exacerbation of her COPD and pulmonary hypertension with the patient's obesity and likely sleep apnea is also a possibility. The patient has been on nebulized therapy in the past and she was prescribed albuterol nebulization packets for home usage. She was also given a course of steroids and advised to follow up with her primary physician. Patient had improvement of her pulse oximetry at the time of discharge and did not want to stay in the hospital and likely her hypoxia is been occurring over a significant period of time.    Assessment:  Acute exacerbation of chronic obstructive pulmonary disease Increasing in size pulmonary nodules    Final Clinical Impression: Final diagnoses:  Dyspnea     Plan:  Patient was advised to return immediately if condition worsens. Patient was advised to follow up with her primary care physician or other specialized physicians involved and in their current assessment.            Daymon Larsen, MD 06/11/15 [phone]

## 2015-06-11 NOTE — Progress Notes (Signed)
Name: Martha Woodard   MRN: [id number]    DOB: 1966-06-18   Date:06/11/2015       Progress Note  Subjective  Chief Complaint  Chief Complaint  Patient presents with  . Breathing Problem    Pulse OX 82    . Hypertension    med refills    HPI Here for f/u of DM, HBP, and breathing problems.  Says that her breathing is about the same as it always is.  O2 in officew today on RA was 82.  No problem-specific assessment & plan notes found for this encounter.   Past Medical History  Diagnosis Date  . Sinus problem   . Diabetes mellitus without complication   . Hypertension     Social History  Substance Use Topics  . Smoking status: Former Smoker -- 78 years    Quit date: 07/10/2013  . Smokeless tobacco: Not on file  . Alcohol Use: No     Current outpatient prescriptions:  .  atorvastatin (LIPITOR) 10 MG tablet, Take 1 tablet (10 mg total) by mouth daily., Disp: 30 tablet, Rfl: 0 .  DiphenhydrAMINE HCl (ALLERGY MED PO), Take 1-2 capsules by mouth every 4 (four) hours as needed., Disp: , Rfl:  .  fluticasone (FLONASE) 50 MCG/ACT nasal spray, Place 1 spray into both nostrils daily. , Disp: , Rfl:  .  ibuprofen (ADVIL,MOTRIN) 200 MG tablet, Take 200 mg by mouth every 6 (six) hours as needed., Disp: , Rfl:  .  losartan (COZAAR) 25 MG tablet, Take 1 tablet (25 mg total) by mouth daily., Disp: 14 tablet, Rfl: 0 .  metFORMIN (GLUCOPHAGE) 500 MG tablet, Take 1 tablet (500 mg total) by mouth daily., Disp: 30 tablet, Rfl: 0 .  metoprolol succinate (TOPROL-XL) 25 MG 24 hr tablet, Take 1 tablet (25 mg total) by mouth daily., Disp: 30 tablet, Rfl: 0 .  Multiple Vitamin (MULTIVITAMIN) tablet, Take 1 tablet by mouth daily., Disp: , Rfl:  .  VENTOLIN HFA 108 (90 BASE) MCG/ACT inhaler, Inhale 2 puffs into the lungs every 4 (four) hours as needed., Disp: , Rfl:   Allergies  Allergen Reactions  . Augmentin [Amoxicillin-Pot Clavulanate] Itching    Review of Systems  Constitutional: Negative  for fever, chills and malaise/fatigue.  HENT: Negative for hearing loss.   Eyes: Negative for blurred vision and double vision.  Respiratory: Positive for cough, sputum production, shortness of breath and wheezing.   Cardiovascular: Positive for palpitations. Negative for chest pain, orthopnea and leg swelling.  Gastrointestinal: Negative for heartburn, nausea, vomiting, abdominal pain, diarrhea and blood in stool.  Genitourinary: Negative for dysuria, urgency and frequency.  Skin: Negative for rash.  Neurological: Negative for weakness and headaches.      Objective  Filed Vitals:   06/11/15 0856  BP: 148/75  Pulse: 89  Temp: 98.3 F (36.8 C)  TempSrc: Oral  Resp: 16  Height: 5\' 4"  (1.626 m)  Weight: 296 lb 6.4 oz (134.446 kg)  SpO2: 82%     Physical Exam  Constitutional: She is well-developed, well-nourished, and in no distress. No distress.  HENT:  Head: Normocephalic and atraumatic.  Neck: Normal range of motion. Neck supple. Carotid bruit is not present. Thyromegaly present.  Cardiovascular: Normal rate, regular rhythm, normal heart sounds and intact distal pulses.  Exam reveals no gallop and no friction rub.   No murmur heard. Pulmonary/Chest: She has wheezes.  Decreased breath sounds throughout with faint wheezes.  Musculoskeletal: She exhibits edema (1+ edema.).  Lymphadenopathy:  She has cervical adenopathy.      No results found for this or any previous visit (from the past 2160 hour(s)).   Assessment & Plan   1. High blood sugar    2. Breathing difficult  - ipratropium-albuterol (DUONEB) 0.5-2.5 (3) MG/3ML nebulizer solution 3 mL; Take 3 mLs by nebulization once.  3. Hypoxia -go to [facility].  Pt. Declines rescue transport.  Recommend that she go to ER NOW!Marland Kitchen

## 2015-06-11 NOTE — ED Notes (Signed)
Patient has had room air 02 SATs 82 to 83 for several years per patient. Patient desirous of discharge and follow up with PMD. States sensation of SOB relieved with treatment.

## 2015-06-12 ENCOUNTER — Other Ambulatory Visit: Payer: Self-pay | Admitting: Family Medicine

## 2015-06-12 ENCOUNTER — Telehealth: Payer: Self-pay | Admitting: Family Medicine

## 2015-06-12 MED ORDER — METFORMIN HCL 500 MG PO TABS: 500.0000 mg | ORAL_TABLET | Freq: Every day | ORAL | Status: DC

## 2015-06-12 MED ORDER — LOSARTAN POTASSIUM 25 MG PO TABS: 25.0000 mg | ORAL_TABLET | Freq: Every day | ORAL | Status: DC

## 2015-06-12 NOTE — Telephone Encounter (Signed)
Pt called requesting refill  On losartan  25 mg  Pt call back # is  [phone]

## 2015-06-12 NOTE — Telephone Encounter (Signed)
Pt notified and made aware that she needs 1 month f/u before future refills.

## 2015-07-02 ENCOUNTER — Ambulatory Visit (INDEPENDENT_AMBULATORY_CARE_PROVIDER_SITE_OTHER): Payer: No Typology Code available for payment source | Admitting: Family Medicine

## 2015-07-02 ENCOUNTER — Encounter: Payer: Self-pay | Admitting: Family Medicine

## 2015-07-02 VITALS — BP 130/79 | HR 92 | Temp 98.8°F | Resp 16 | Ht 64.0 in | Wt 298.0 lb

## 2015-07-02 DIAGNOSIS — I10 Essential (primary) hypertension: Secondary | ICD-10-CM | POA: Diagnosis not present

## 2015-07-02 DIAGNOSIS — R600 Localized edema: Secondary | ICD-10-CM

## 2015-07-02 DIAGNOSIS — E113219 Type 2 diabetes mellitus with mild nonproliferative diabetic retinopathy with macular edema, unspecified eye: Secondary | ICD-10-CM

## 2015-07-02 DIAGNOSIS — IMO0002 Reserved for concepts with insufficient information to code with codable children: Secondary | ICD-10-CM

## 2015-07-02 DIAGNOSIS — Z23 Encounter for immunization: Secondary | ICD-10-CM | POA: Diagnosis not present

## 2015-07-02 DIAGNOSIS — E1165 Type 2 diabetes mellitus with hyperglycemia: Secondary | ICD-10-CM | POA: Diagnosis not present

## 2015-07-02 DIAGNOSIS — L719 Rosacea, unspecified: Secondary | ICD-10-CM | POA: Diagnosis not present

## 2015-07-02 DIAGNOSIS — E119 Type 2 diabetes mellitus without complications: Secondary | ICD-10-CM | POA: Diagnosis not present

## 2015-07-02 DIAGNOSIS — I491 Atrial premature depolarization: Secondary | ICD-10-CM

## 2015-07-02 LAB — POCT GLYCOSYLATED HEMOGLOBIN (HGB A1C)

## 2015-07-02 MED ORDER — DOXYCYCLINE HYCLATE 100 MG PO TABS: ORAL_TABLET | ORAL | Status: DC

## 2015-07-02 MED ORDER — HYDROCHLOROTHIAZIDE 12.5 MG PO TABS: 12.5000 mg | ORAL_TABLET | Freq: Every day | ORAL | Status: DC

## 2015-07-02 MED ORDER — METOPROLOL SUCCINATE ER 25 MG PO TB24: 25.0000 mg | ORAL_TABLET | Freq: Every day | ORAL | Status: DC

## 2015-07-02 MED ORDER — METFORMIN HCL 500 MG PO TABS: 500.0000 mg | ORAL_TABLET | Freq: Two times a day (BID) | ORAL | Status: DC

## 2015-07-02 NOTE — Progress Notes (Signed)
Name: Martha Woodard   MRN: [id number]    DOB: 1966/06/17   Date:07/02/2015       Progress Note  Subjective  Chief Complaint  Chief Complaint  Patient presents with  . Hypertension  . Diabetes  . Hyperlipidemia    HPI Here for f/u of DM and HBP.  Asthma is doing well at present.  Still gaining weight.  Not checking BS at home.  Does not have a glucometer  No problem-specific assessment & plan notes found for this encounter.   Past Medical History  Diagnosis Date  . Sinus problem   . Diabetes mellitus without complication ([place])   . Hypertension   . Asthma     Social History  Substance Use Topics  . Smoking status: Former Smoker -- 49 years    Quit date: 07/10/2013  . Smokeless tobacco: Not on file  . Alcohol Use: No     Current outpatient prescriptions:  .  albuterol (PROVENTIL) (2.5 MG/3ML) 0.083% nebulizer solution, Take 3 mLs (2.5 mg total) by nebulization every 6 (six) hours as needed for wheezing or shortness of breath., Disp: 75 mL, Rfl: 12 .  atorvastatin (LIPITOR) 10 MG tablet, Take 1 tablet (10 mg total) by mouth daily., Disp: 30 tablet, Rfl: 0 .  DiphenhydrAMINE HCl (ALLERGY MED PO), Take 1-2 capsules by mouth every 4 (four) hours as needed., Disp: , Rfl:  .  fluticasone (FLONASE) 50 MCG/ACT nasal spray, Place 1 spray into both nostrils daily. , Disp: , Rfl:  .  ibuprofen (ADVIL,MOTRIN) 200 MG tablet, Take 200 mg by mouth every 6 (six) hours as needed., Disp: , Rfl:  .  losartan (COZAAR) 25 MG tablet, Take 1 tablet (25 mg total) by mouth daily., Disp: 30 tablet, Rfl: 0 .  metFORMIN (GLUCOPHAGE) 500 MG tablet, Take 1 tablet (500 mg total) by mouth 2 (two) times daily with a meal., Disp: 60 tablet, Rfl: 12 .  metoprolol succinate (TOPROL-XL) 25 MG 24 hr tablet, Take 1 tablet (25 mg total) by mouth daily., Disp: 30 tablet, Rfl: 12 .  Multiple Vitamin (MULTIVITAMIN) tablet, Take 1 tablet by mouth daily., Disp: , Rfl:  .  doxycycline (VIBRA-TABS) 100 MG tablet, Take  i capsule twice a day for 1 week, then once at bedtime thereafter., Disp: 30 tablet, Rfl: 3 .  hydrochlorothiazide (HYDRODIURIL) 12.5 MG tablet, Take 1 tablet (12.5 mg total) by mouth daily., Disp: 30 tablet, Rfl: 6  Allergies  Allergen Reactions  . Augmentin [Amoxicillin-Pot Clavulanate] Itching    Review of Systems  Constitutional: Positive for malaise/fatigue. Negative for fever, chills and weight loss.  HENT: Negative for hearing loss.   Eyes: Negative for blurred vision and double vision.  Respiratory: Negative for cough, shortness of breath and wheezing.   Cardiovascular: Negative for chest pain, palpitations and leg swelling.  Gastrointestinal: Negative for heartburn, abdominal pain and blood in stool.  Genitourinary: Negative for dysuria, urgency and frequency.  Neurological: Negative for weakness and headaches.  Psychiatric/Behavioral: The patient has insomnia.       Objective  Filed Vitals:   07/02/15 0945  BP: 130/79  Pulse: 92  Temp: 98.8 F (37.1 C)  TempSrc: Oral  Resp: 16  Height: _0  (1.626 m)  Weight: 298 lb (135.172 kg)     Physical Exam  Constitutional: She is oriented to person, place, and time and well-developed, well-nourished, and in no distress. No distress.  HENT:  Head: Normocephalic and atraumatic.  Eyes: Conjunctivae and EOM are normal. Pupils  are equal, round, and reactive to light. No scleral icterus.  Neck: Normal range of motion. Neck supple. Carotid bruit is not present. No thyromegaly present.  Cardiovascular: Normal rate, regular rhythm, normal heart sounds and intact distal pulses.  Exam reveals no gallop and no friction rub.   No murmur heard. Pulmonary/Chest: Effort normal and breath sounds normal. No respiratory distress. She has no wheezes. She has no rales.  Abdominal: Soft. Bowel sounds are normal. She exhibits no distension, no abdominal bruit and no mass. There is no tenderness.  obese  Musculoskeletal: She exhibits edema  (1+ pitting edema bilateral).  Lymphadenopathy:    She has no cervical adenopathy.  Neurological: She is alert and oriented to person, place, and time.  Skin:  Ac ne rosacea of face  Vitals reviewed.     Recent Results (from the past 2160 hour(s))  CBC with Differential/Platelet     Status: Abnormal   Collection Time: 06/11/15 10:52 AM  Result Value Ref Range   WBC 6.7 3.6 - 11.0 K/uL   RBC 5.86 (H) 3.80 - 5.20 MIL/uL   Hemoglobin 15.0 12.0 - 16.0 g/dL   HCT 47.2 (H) 35.0 - 47.0 %   MCV 80.5 80.0 - 100.0 fL   MCH 25.6 (L) 26.0 - 34.0 pg   MCHC 31.9 (L) 32.0 - 36.0 g/dL   RDW 17.5 (H) 11.5 - 14.5 %   Platelets 254 150 - 440 K/uL   Neutrophils Relative % 58 %   Neutro Abs 3.9 1.4 - 6.5 K/uL   Lymphocytes Relative 29 %   Lymphs Abs 2.0 1.0 - 3.6 K/uL   Monocytes Relative 11 %   Monocytes Absolute 0.7 0.2 - 0.9 K/uL   Eosinophils Relative 1 %   Eosinophils Absolute 0.1 0 - 0.7 K/uL   Basophils Relative 1 %   Basophils Absolute 0.0 0 - 0.1 K/uL  Comprehensive metabolic panel     Status: Abnormal   Collection Time: 06/11/15 10:52 AM  Result Value Ref Range   Sodium 137 135 - 145 mmol/L   Potassium 4.0 3.5 - 5.1 mmol/L   Chloride 94 (L) 101 - 111 mmol/L   CO2 34 (H) 22 - 32 mmol/L   Glucose, Bld 106 (H) 65 - 99 mg/dL   BUN 7 6 - 20 mg/dL   Creatinine, Ser 0.64 0.44 - 1.00 mg/dL   Calcium 8.8 (L) 8.9 - 10.3 mg/dL   Total Protein 7.4 6.5 - 8.1 g/dL   Albumin 3.6 3.5 - 5.0 g/dL   AST 32 15 - 41 U/L   ALT 26 14 - 54 U/L   Alkaline Phosphatase 63 38 - 126 U/L   Total Bilirubin 0.7 0.3 - 1.2 mg/dL   GFR calc non Af Amer >60 >60 mL/min   GFR calc Af Amer >60 >60 mL/min    Comment: (NOTE) The eGFR has been calculated using the CKD EPI equation. This calculation has not been validated in all clinical situations. eGFR's persistently <60 mL/min signify possible Chronic Kidney Disease.    Anion gap 9 5 - 15  Troponin I     Status: None   Collection Time: 06/11/15 10:52 AM   Result Value Ref Range   Troponin I <0.03 <0.031 ng/mL    Comment:        NO INDICATION OF MYOCARDIAL INJURY.   TSH     Status: None   Collection Time: 06/11/15 10:52 AM  Result Value Ref Range   TSH 1.805 0.350 -  4.500 uIU/mL  Brain natriuretic peptide     Status: None   Collection Time: 06/11/15 10:52 AM  Result Value Ref Range   B Natriuretic Peptide 81.0 0.0 - 100.0 pg/mL  POCT HgB A1C     Status: Abnormal   Collection Time: 07/02/15 10:03 AM  Result Value Ref Range   Hemoglobin A1C 7.8%      Assessment & Plan 1. 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  Dispense: 60 tablet; Refill: 12  2. Need for influenza vaccination  - Flu Vaccine QUAD 36+ mos PF IM (Fluarix & Fluzone Quad PF)  3. Essential hypertension   4. Pedal edema  - hydrochlorothiazide (HYDRODIURIL) 12.5 MG tablet; Take 1 tablet (12.5 mg total) by mouth daily.  Dispense: 30 tablet; Refill: 6  5. Acne rosacea  - doxycycline (VIBRA-TABS) 100 MG tablet; Take i capsule twice a day for 1 week, then once at bedtime thereafter.  Dispense: 30 tablet; Refill: 3  6. PAC (premature atrial contraction)  - metoprolol succinate (TOPROL-XL) 25 MG 24 hr tablet; Take 1 tablet (25 mg total) by mouth daily.  Dispense: 30 tablet; Refill: 12

## 2015-07-02 NOTE — Patient Instructions (Addendum)
Rx written for Accuchek glucometer with strips to check BS once daily and record.   Discussed weihgt loss again.    Continue other meds at current doses.

## 2015-07-08 ENCOUNTER — Other Ambulatory Visit: Payer: Self-pay | Admitting: Family Medicine

## 2015-07-09 ENCOUNTER — Telehealth: Payer: Self-pay | Admitting: Family Medicine

## 2015-07-09 DIAGNOSIS — I10 Essential (primary) hypertension: Secondary | ICD-10-CM

## 2015-07-09 MED ORDER — LOSARTAN POTASSIUM 25 MG PO TABS: 25.0000 mg | ORAL_TABLET | Freq: Every day | ORAL | Status: DC

## 2015-07-09 NOTE — Telephone Encounter (Signed)
Pt called states that her Losartan  25 mg was not called into the Drug store [facility], Pt also have request a  Nurse to call her to let her know what he Blood Sugar range should be. Pt call back # is  [phone]

## 2015-07-09 NOTE — Telephone Encounter (Signed)
Advised. Called meds in and mailed info on blood sugar and a chart.[facility]

## 2015-07-17 ENCOUNTER — Other Ambulatory Visit: Payer: Self-pay

## 2015-07-17 MED ORDER — ATORVASTATIN CALCIUM 10 MG PO TABS: 10.0000 mg | ORAL_TABLET | Freq: Every day | ORAL | Status: DC

## 2015-07-18 ENCOUNTER — Other Ambulatory Visit: Payer: Self-pay | Admitting: Family Medicine

## 2015-07-18 NOTE — Telephone Encounter (Signed)
Pt needs a prescription for atorvastatin 10 mg once daily sent to [facility].  It was prescribed by Dr. Rockey Situ originally.  Her call back number is [phone]

## 2015-07-19 MED ORDER — ATORVASTATIN CALCIUM 10 MG PO TABS: 10.0000 mg | ORAL_TABLET | Freq: Every day | ORAL | Status: DC

## 2015-07-19 NOTE — Telephone Encounter (Signed)
DONE

## 2015-07-20 NOTE — Telephone Encounter (Signed)
Rx was send yesterday and noted by [place].

## 2015-09-06 ENCOUNTER — Other Ambulatory Visit: Payer: Self-pay | Admitting: Family Medicine

## 2015-09-25 ENCOUNTER — Encounter: Payer: Self-pay | Admitting: Emergency Medicine

## 2015-09-25 ENCOUNTER — Emergency Department: Payer: No Typology Code available for payment source

## 2015-09-25 ENCOUNTER — Inpatient Hospital Stay
Admission: EM | Admit: 2015-09-25 | Discharge: 2015-10-01 | DRG: 190 | Disposition: A | Discharge status: Home Health | Payer: No Typology Code available for payment source | Attending: Internal Medicine | Admitting: Internal Medicine

## 2015-09-25 DIAGNOSIS — Z6841 Body Mass Index (BMI) 40.0 and over, adult: Secondary | ICD-10-CM

## 2015-09-25 DIAGNOSIS — J45909 Unspecified asthma, uncomplicated: Secondary | ICD-10-CM | POA: Diagnosis present

## 2015-09-25 DIAGNOSIS — J209 Acute bronchitis, unspecified: Secondary | ICD-10-CM | POA: Diagnosis present

## 2015-09-25 DIAGNOSIS — R1310 Dysphagia, unspecified: Secondary | ICD-10-CM | POA: Diagnosis present

## 2015-09-25 DIAGNOSIS — J440 Chronic obstructive pulmonary disease with acute lower respiratory infection: Principal | ICD-10-CM | POA: Diagnosis present

## 2015-09-25 DIAGNOSIS — J9601 Acute respiratory failure with hypoxia: Secondary | ICD-10-CM | POA: Diagnosis present

## 2015-09-25 DIAGNOSIS — I10 Essential (primary) hypertension: Secondary | ICD-10-CM | POA: Diagnosis present

## 2015-09-25 DIAGNOSIS — J9621 Acute and chronic respiratory failure with hypoxia: Secondary | ICD-10-CM | POA: Diagnosis present

## 2015-09-25 DIAGNOSIS — J9602 Acute respiratory failure with hypercapnia: Secondary | ICD-10-CM

## 2015-09-25 DIAGNOSIS — Z79899 Other long term (current) drug therapy: Secondary | ICD-10-CM | POA: Diagnosis not present

## 2015-09-25 DIAGNOSIS — Z8249 Family history of ischemic heart disease and other diseases of the circulatory system: Secondary | ICD-10-CM | POA: Diagnosis not present

## 2015-09-25 DIAGNOSIS — E11311 Type 2 diabetes mellitus with unspecified diabetic retinopathy with macular edema: Secondary | ICD-10-CM | POA: Diagnosis present

## 2015-09-25 DIAGNOSIS — I5031 Acute diastolic (congestive) heart failure: Secondary | ICD-10-CM | POA: Diagnosis present

## 2015-09-25 DIAGNOSIS — J441 Chronic obstructive pulmonary disease with (acute) exacerbation: Secondary | ICD-10-CM | POA: Diagnosis present

## 2015-09-25 DIAGNOSIS — B379 Candidiasis, unspecified: Secondary | ICD-10-CM | POA: Diagnosis present

## 2015-09-25 DIAGNOSIS — G4733 Obstructive sleep apnea (adult) (pediatric): Secondary | ICD-10-CM | POA: Diagnosis present

## 2015-09-25 DIAGNOSIS — R911 Solitary pulmonary nodule: Secondary | ICD-10-CM | POA: Diagnosis present

## 2015-09-25 DIAGNOSIS — J9622 Acute and chronic respiratory failure with hypercapnia: Secondary | ICD-10-CM | POA: Diagnosis present

## 2015-09-25 DIAGNOSIS — Z23 Encounter for immunization: Secondary | ICD-10-CM | POA: Diagnosis not present

## 2015-09-25 DIAGNOSIS — Z87891 Personal history of nicotine dependence: Secondary | ICD-10-CM

## 2015-09-25 DIAGNOSIS — R531 Weakness: Secondary | ICD-10-CM

## 2015-09-25 DIAGNOSIS — L309 Dermatitis, unspecified: Secondary | ICD-10-CM | POA: Diagnosis present

## 2015-09-25 DIAGNOSIS — E119 Type 2 diabetes mellitus without complications: Secondary | ICD-10-CM

## 2015-09-25 DIAGNOSIS — M7989 Other specified soft tissue disorders: Secondary | ICD-10-CM

## 2015-09-25 DIAGNOSIS — R0600 Dyspnea, unspecified: Secondary | ICD-10-CM

## 2015-09-25 DIAGNOSIS — E1165 Type 2 diabetes mellitus with hyperglycemia: Secondary | ICD-10-CM | POA: Diagnosis present

## 2015-09-25 DIAGNOSIS — E662 Morbid (severe) obesity with alveolar hypoventilation: Secondary | ICD-10-CM | POA: Diagnosis present

## 2015-09-25 DIAGNOSIS — Z823 Family history of stroke: Secondary | ICD-10-CM

## 2015-09-25 DIAGNOSIS — R0902 Hypoxemia: Secondary | ICD-10-CM

## 2015-09-25 DIAGNOSIS — Z825 Family history of asthma and other chronic lower respiratory diseases: Secondary | ICD-10-CM

## 2015-09-25 DIAGNOSIS — E1159 Type 2 diabetes mellitus with other circulatory complications: Secondary | ICD-10-CM

## 2015-09-25 DIAGNOSIS — Z7984 Long term (current) use of oral hypoglycemic drugs: Secondary | ICD-10-CM | POA: Diagnosis not present

## 2015-09-25 DIAGNOSIS — Z881 Allergy status to other antibiotic agents status: Secondary | ICD-10-CM

## 2015-09-25 HISTORY — DX: Chronic obstructive pulmonary disease, unspecified: J44.9

## 2015-09-25 HISTORY — DX: Other nonspecific abnormal finding of lung field: R91.8

## 2015-09-25 LAB — BASIC METABOLIC PANEL
ANION GAP: 8 (ref 5–15)
BUN: 11 mg/dL (ref 6–20)
CHLORIDE: 88 mmol/L — AB (ref 101–111)
CO2: 39 mmol/L — ABNORMAL HIGH (ref 22–32)
Calcium: 8.7 mg/dL — ABNORMAL LOW (ref 8.9–10.3)
Creatinine, Ser: 0.5 mg/dL (ref 0.44–1.00)
Glucose, Bld: 122 mg/dL — ABNORMAL HIGH (ref 65–99)
POTASSIUM: 3.5 mmol/L (ref 3.5–5.1)
SODIUM: 135 mmol/L (ref 135–145)

## 2015-09-25 LAB — TROPONIN I
TROPONIN I: 0.06 ng/mL — AB (ref <0.031)
Troponin I: 0.09 ng/mL — ABNORMAL HIGH (ref <0.031)
Troponin I: 0.03 ng/mL (ref <0.031)

## 2015-09-25 LAB — CBC WITH DIFFERENTIAL/PLATELET
BASOS ABS: 0 10*3/uL (ref 0–0.1)
Basophils Relative: 1 %
EOS ABS: 0.1 10*3/uL (ref 0–0.7)
HCT: 44.5 % (ref 35.0–47.0)
HEMOGLOBIN: 13.8 g/dL (ref 12.0–16.0)
LYMPHS ABS: 1 10*3/uL (ref 1.0–3.6)
Lymphocytes Relative: 14 %
MCH: 24.3 pg — ABNORMAL LOW (ref 26.0–34.0)
MCHC: 31 g/dL — ABNORMAL LOW (ref 32.0–36.0)
MCV: 78.4 fL — ABNORMAL LOW (ref 80.0–100.0)
Monocytes Absolute: 0.6 10*3/uL (ref 0.2–0.9)
Monocytes Relative: 9 %
Neutro Abs: 5.5 10*3/uL (ref 1.4–6.5)
PLATELETS: 262 10*3/uL (ref 150–440)
RBC: 5.67 MIL/uL — ABNORMAL HIGH (ref 3.80–5.20)
RDW: 18.3 % — ABNORMAL HIGH (ref 11.5–14.5)
WBC: 7.2 10*3/uL (ref 3.6–11.0)

## 2015-09-25 LAB — BLOOD GAS, ARTERIAL
ALLENS TEST (PASS/FAIL): POSITIVE — AB
Acid-Base Excess: 15.6 mmol/L — ABNORMAL HIGH (ref 0.0–3.0)
BICARBONATE: 45.7 meq/L — AB (ref 21.0–28.0)
FIO2: 0.4
O2 SAT: 90.2 %
PO2 ART: 61 mmHg — AB (ref 83.0–108.0)
Patient temperature: 37
pCO2 arterial: 79 mmHg (ref 32.0–48.0)
pH, Arterial: 7.37 (ref 7.350–7.450)

## 2015-09-25 LAB — GLUCOSE, CAPILLARY
Glucose-Capillary: 239 mg/dL — ABNORMAL HIGH (ref 65–99)
Glucose-Capillary: 156 mg/dL — ABNORMAL HIGH (ref 65–99)

## 2015-09-25 LAB — BLOOD GAS, VENOUS
PCO2 VEN: 84 mmHg — AB (ref 44.0–60.0)
PH VEN: 7.35 (ref 7.320–7.430)

## 2015-09-25 LAB — EXPECTORATED SPUTUM ASSESSMENT W REFEX TO RESP CULTURE

## 2015-09-25 LAB — MRSA PCR SCREENING: MRSA BY PCR: NEGATIVE

## 2015-09-25 LAB — BRAIN NATRIURETIC PEPTIDE: B NATRIURETIC PEPTIDE 5: 251 pg/mL — AB (ref 0.0–100.0)

## 2015-09-25 MED ORDER — IPRATROPIUM-ALBUTEROL 0.5-2.5 (3) MG/3ML IN SOLN
3.0000 mL | Freq: Four times a day (QID) | RESPIRATORY_TRACT | Status: DC
  Administered 2015-09-25 – 2015-09-26 (×4): 3 mL via RESPIRATORY_TRACT
  Filled 2015-09-25 (×4): qty 3

## 2015-09-25 MED ORDER — ADULT MULTIVITAMIN W/MINERALS CH
1.0000 | ORAL_TABLET | Freq: Every day | ORAL | Status: DC
  Administered 2015-09-25 – 2015-10-01 (×6): 1 via ORAL
  Filled 2015-09-25 (×7): qty 1

## 2015-09-25 MED ORDER — CETYLPYRIDINIUM CHLORIDE 0.05 % MT LIQD
7.0000 mL | Freq: Two times a day (BID) | OROMUCOSAL | Status: DC
  Administered 2015-09-25 – 2015-10-01 (×10): 7 mL via OROMUCOSAL

## 2015-09-25 MED ORDER — METHYLPREDNISOLONE SODIUM SUCC 40 MG IJ SOLR
40.0000 mg | Freq: Three times a day (TID) | INTRAMUSCULAR | Status: DC
  Administered 2015-09-25 – 2015-09-28 (×10): 40 mg via INTRAVENOUS
  Filled 2015-09-25 (×10): qty 1

## 2015-09-25 MED ORDER — AZITHROMYCIN 250 MG PO TABS
500.0000 mg | ORAL_TABLET | Freq: Every day | ORAL | Status: AC
  Administered 2015-09-25: 500 mg via ORAL
  Filled 2015-09-25: qty 2

## 2015-09-25 MED ORDER — SODIUM CHLORIDE 0.9 % IJ SOLN
3.0000 mL | Freq: Two times a day (BID) | INTRAMUSCULAR | Status: DC
  Administered 2015-09-25 – 2015-10-01 (×12): 3 mL via INTRAVENOUS

## 2015-09-25 MED ORDER — ENOXAPARIN SODIUM 40 MG/0.4ML ~~LOC~~ SOLN
40.0000 mg | Freq: Two times a day (BID) | SUBCUTANEOUS | Status: DC
  Administered 2015-09-25 – 2015-10-01 (×12): 40 mg via SUBCUTANEOUS
  Filled 2015-09-25 (×12): qty 0.4

## 2015-09-25 MED ORDER — ONDANSETRON HCL 4 MG/2ML IJ SOLN: 4.0000 mg | Freq: Four times a day (QID) | INTRAMUSCULAR | Status: DC | PRN

## 2015-09-25 MED ORDER — INSULIN ASPART 100 UNIT/ML ~~LOC~~ SOLN: 0.0000 [IU] | Freq: Every day | SUBCUTANEOUS | Status: DC

## 2015-09-25 MED ORDER — ACETAMINOPHEN 650 MG RE SUPP: 650.0000 mg | Freq: Four times a day (QID) | RECTAL | Status: DC | PRN

## 2015-09-25 MED ORDER — LOSARTAN POTASSIUM 25 MG PO TABS
25.0000 mg | ORAL_TABLET | Freq: Every day | ORAL | Status: DC
  Administered 2015-09-25 – 2015-09-30 (×6): 25 mg via ORAL
  Filled 2015-09-25 (×5): qty 1
  Filled 2015-09-25: qty 2

## 2015-09-25 MED ORDER — ONDANSETRON HCL 4 MG PO TABS: 4.0000 mg | ORAL_TABLET | Freq: Four times a day (QID) | ORAL | Status: DC | PRN

## 2015-09-25 MED ORDER — INSULIN ASPART 100 UNIT/ML ~~LOC~~ SOLN
0.0000 [IU] | Freq: Three times a day (TID) | SUBCUTANEOUS | Status: DC
  Administered 2015-09-25: 3 [IU] via SUBCUTANEOUS
  Administered 2015-09-26: 1 [IU] via SUBCUTANEOUS
  Administered 2015-09-26 (×2): 2 [IU] via SUBCUTANEOUS
  Administered 2015-09-27 (×2): 1 [IU] via SUBCUTANEOUS
  Administered 2015-09-27: 3 [IU] via SUBCUTANEOUS
  Administered 2015-09-28: 1 [IU] via SUBCUTANEOUS
  Administered 2015-09-28: 5 [IU] via SUBCUTANEOUS
  Administered 2015-09-29 – 2015-09-30 (×3): 1 [IU] via SUBCUTANEOUS
  Filled 2015-09-25: qty 1
  Filled 2015-09-25: qty 3
  Filled 2015-09-25 (×2): qty 5
  Filled 2015-09-25: qty 2
  Filled 2015-09-25 (×3): qty 1
  Filled 2015-09-25: qty 2
  Filled 2015-09-25 (×3): qty 1

## 2015-09-25 MED ORDER — FUROSEMIDE 10 MG/ML IJ SOLN
40.0000 mg | Freq: Once | INTRAMUSCULAR | Status: AC
  Administered 2015-09-25: 40 mg via INTRAVENOUS
  Filled 2015-09-25: qty 4

## 2015-09-25 MED ORDER — FLUTICASONE PROPIONATE 50 MCG/ACT NA SUSP
1.0000 | Freq: Every day | NASAL | Status: DC
  Administered 2015-09-27 – 2015-10-01 (×5): 1 via NASAL
  Filled 2015-09-25: qty 16

## 2015-09-25 MED ORDER — METHYLPREDNISOLONE SODIUM SUCC 125 MG IJ SOLR
125.0000 mg | Freq: Once | INTRAMUSCULAR | Status: AC
  Administered 2015-09-25: 125 mg via INTRAVENOUS
  Filled 2015-09-25: qty 2

## 2015-09-25 MED ORDER — ACETAMINOPHEN 325 MG PO TABS
650.0000 mg | ORAL_TABLET | Freq: Four times a day (QID) | ORAL | Status: DC | PRN
  Administered 2015-09-26 – 2015-09-29 (×3): 650 mg via ORAL
  Filled 2015-09-25 (×3): qty 2

## 2015-09-25 MED ORDER — PNEUMOCOCCAL VAC POLYVALENT 25 MCG/0.5ML IJ INJ
0.5000 mL | INJECTION | INTRAMUSCULAR | Status: AC
  Administered 2015-09-26: 0.5 mL via INTRAMUSCULAR
  Filled 2015-09-25: qty 0.5

## 2015-09-25 MED ORDER — FUROSEMIDE 40 MG PO TABS
40.0000 mg | ORAL_TABLET | Freq: Every day | ORAL | Status: DC
  Administered 2015-09-26 – 2015-09-27 (×2): 40 mg via ORAL
  Filled 2015-09-25 (×2): qty 1

## 2015-09-25 MED ORDER — ATORVASTATIN CALCIUM 10 MG PO TABS
10.0000 mg | ORAL_TABLET | Freq: Every day | ORAL | Status: DC
  Administered 2015-09-25 – 2015-10-01 (×7): 10 mg via ORAL
  Filled 2015-09-25 (×7): qty 1

## 2015-09-25 MED ORDER — METOPROLOL SUCCINATE ER 25 MG PO TB24
25.0000 mg | ORAL_TABLET | Freq: Every day | ORAL | Status: DC
  Administered 2015-09-25 – 2015-10-01 (×7): 25 mg via ORAL
  Filled 2015-09-25 (×7): qty 1

## 2015-09-25 MED ORDER — ENOXAPARIN SODIUM 30 MG/0.3ML ~~LOC~~ SOLN: 30.0000 mg | SUBCUTANEOUS | Status: DC

## 2015-09-25 MED ORDER — METFORMIN HCL 500 MG PO TABS
500.0000 mg | ORAL_TABLET | Freq: Two times a day (BID) | ORAL | Status: DC
  Administered 2015-09-25 – 2015-10-01 (×12): 500 mg via ORAL
  Filled 2015-09-25 (×12): qty 1

## 2015-09-25 MED ORDER — AZITHROMYCIN 250 MG PO TABS
250.0000 mg | ORAL_TABLET | Freq: Every day | ORAL | Status: AC
  Administered 2015-09-26 – 2015-09-29 (×4): 250 mg via ORAL
  Filled 2015-09-25 (×5): qty 1

## 2015-09-25 NOTE — Progress Notes (Signed)
ABG results relayed to Dr Mortimer Fries after bedside RN Sarah notified this [facility] RN of critical CO2 of 79.

## 2015-09-25 NOTE — ED Notes (Signed)
Upon entering the room, pts sats were 73%, [place] applied 4L, pt tolerating well, sats 94%.

## 2015-09-25 NOTE — ED Notes (Signed)
Ambulated to BR. Tolerated well.

## 2015-09-25 NOTE — ED Provider Notes (Signed)
[facility] Emergency Department Provider Note     Time seen: ----------------------------------------- 9:42 AM on 09/25/2015 -----------------------------------------    I have reviewed the triage vital signs and the nursing notes.   HISTORY  Chief Complaint Shortness of Breath    HPI Martha Woodard is a 49 y.o. female who presents to ER for left upper arm spasms and tingling. Upon entering the room, oxygen saturations were 73%. Nasal cannula oxygen of 4 L was supplied. Sats improved to the mid 90s. Patient has felt like she can't get a good breath last 3-4 days, had some recent chest pain. She denies fevers chills, has had significant cough, no vomiting or diarrhea.   Past Medical History  Diagnosis Date  . Sinus problem   . Diabetes mellitus without complication ([place])   . Hypertension   . Asthma     Patient Active Problem List   Diagnosis Date Noted  . Hypertension 07/02/2015  . Pedal edema 07/02/2015  . Acne rosacea 07/02/2015  . Sinus tachycardia ([place]) 06/26/2014  . PAC (premature atrial contraction) 06/26/2014  . Palpitations 06/26/2014  . Smoking history 06/26/2014  . Morbid obesity ([place]) 06/26/2014  . Diabetes mellitus type 2, controlled, without complications ([place]) 0000000  . Hyperlipidemia 06/26/2014  . Thyroid nodule 05/11/2014  . Thyroid cyst 02/06/2014  . Thyroid mass 02/06/2014  . Lung nodule seen on imaging study 02/06/2014  . Neck nodule 01/19/2014    Past Surgical History  Procedure Laterality Date  . Wisdom tooth extraction    . Biopsy thyroid Right 02-06-14    NEGATIVE FOR MALIGNANT CELLS proteinaceous material and macrophages.  . Thryoid fna Right April 2015    Proteinaceous material and macrophages.    Allergies Augmentin  Social History Social History  Substance Use Topics  . Smoking status: Former Smoker -- 43 years    Quit date: 07/10/2013  . Smokeless tobacco: None  . Alcohol Use: No    Review  of Systems Constitutional: Negative for fever. Eyes: Negative for visual changes. ENT: Negative for sore throat. Cardiovascular: Positive for chest pain Respiratory: Positive for shortness of breath Gastrointestinal: Negative for abdominal pain, vomiting and diarrhea. Genitourinary: Negative for dysuria. Musculoskeletal: Negative for back pain. Skin: Negative for rash. Neurological: Negative for headaches, focal weakness or numbness. Positive for left arm paresthesias  10-point ROS otherwise negative.  ____________________________________________   PHYSICAL EXAM:  VITAL SIGNS: ED Triage Vitals  Enc Vitals Group     BP 09/25/15 0915 149/65 mmHg     Pulse Rate 09/25/15 0915 70     Resp 09/25/15 0915 22     Temp 09/25/15 0915 98.3 F (36.8 C)     Temp src --      SpO2 09/25/15 0915 70 %     Weight 09/25/15 0915 298 lb (135.172 kg)     Height 09/25/15 0915 5\' 4"  (1.626 m)     Head Cir --      Peak Flow --      Pain Score --      Pain Loc --      Pain Edu? --      Excl. in [place]? --     Constitutional: Alert and oriented. Morbidly obese, mild distress Eyes: Injected sclerae bilaterally.Marland Kitchen PERRL. Normal extraocular movements. ENT   Head: Normocephalic and atraumatic.   Nose: No congestion/rhinnorhea.   Mouth/Throat: Mucous membranes are moist.   Neck: No stridor. Cardiovascular: Normal rate, regular rhythm. Normal and symmetric distal pulses are present in  all extremities. No murmurs, rubs, or gallops. Respiratory: Normal respiratory effort without tachypnea nor retractions. Diminished but clear breath sounds bilaterally. Gastrointestinal: Soft and nontender. No distention. No abdominal bruits.  Musculoskeletal: Nontender with normal range of motion in all extremities. No joint effusions. Significant pitting lower strep edema bilaterally. Neurologic:  Normal speech and language. No gross focal neurologic deficits are appreciated. Speech is normal. No gait  instability. Skin:  Skin is warm, dry and intact. No rash noted. Psychiatric: Mood and affect are normal. Speech and behavior are normal. Patient exhibits appropriate insight and judgment. ____________________________________________  EKG: Interpreted by me. Normal sinus rhythm rate of 92 bpm, normal PR interval, normal QS with, normal QT interval. T-wave changes.  ____________________________________________  ED COURSE:  Pertinent labs & imaging results that were available during my care of the patient were reviewed by me and considered in my medical decision making (see chart for details). Patient with markedly hypoxia and likely hypercarbia. Room air sats were 70%. I ordered BiPAP for the patient. ____________________________________________    LABS (pertinent positives/negatives)  Labs Reviewed  CBC WITH DIFFERENTIAL/PLATELET - Abnormal; Notable for the following:    RBC 5.67 (*)    MCV 78.4 (*)    MCH 24.3 (*)    MCHC 31.0 (*)    RDW 18.3 (*)    All other components within normal limits  BASIC METABOLIC PANEL - Abnormal; Notable for the following:    Chloride 88 (*)    CO2 39 (*)    Glucose, Bld 122 (*)    Calcium 8.7 (*)    All other components within normal limits  BRAIN NATRIURETIC PEPTIDE - Abnormal; Notable for the following:    B Natriuretic Peptide 251.0 (*)    All other components within normal limits  TROPONIN I - Abnormal; Notable for the following:    Troponin I 0.06 (*)    All other components within normal limits  BLOOD GAS, VENOUS - Abnormal; Notable for the following:    pCO2, Ven 84 (*)    All other components within normal limits  CULTURE, BLOOD (ROUTINE X 2)  CULTURE, BLOOD (ROUTINE X 2)    CRITICAL CARE Performed by: Earleen Newport   Total critical care time: 30 minutes  Critical care time was exclusive of separately billable procedures and treating other patients.  Critical care was necessary to treat or prevent imminent or  life-threatening deterioration.  Critical care was time spent personally by me on the following activities: development of treatment plan with patient and/or surrogate as well as nursing, discussions with consultants, evaluation of patient's response to treatment, examination of patient, obtaining history from patient or surrogate, ordering and performing treatments and interventions, ordering and review of laboratory studies, ordering and review of radiographic studies, pulse oximetry and re-evaluation of patient's condition.  RADIOLOGY Images were viewed by me  Chest x-ray IMPRESSION: Mild cardiomegaly. No edema or consolidation.  ____________________________________________  FINAL ASSESSMENT AND PLAN  Acute hypercarbic respiratory failure with hypoxia  Plan: Patient with labs and imaging as dictated above. Patient's been given Solu-Medrol, splashed on BiPAP for hypercarbia and hypoxia. Troponin is mildly elevated. She will need inpatient admission.   Earleen Newport, MD   Earleen Newport, MD 09/25/15 [phone]

## 2015-09-25 NOTE — Consult Note (Signed)
[facility] Consultation     49 year old female presents with acute respiratory failure, likely due to acute exacerbation of COPD.   Assessment and Plan:  Acute respiratory failure secondary to acute exacerbation of COPD. -Continue IV steroids.  Acute bronchitis, with acute exacerbation of COPD. -Continue current antibiotics. -Check sputum cultures. -Will need outpatient pulmonary follow-up, as well as long-acting inhaler regimen.  Morbid obesity with possible obesity hypoventilation syndrome. -Check ABG, will start nocturnal BiPAP. -Weight loss would be beneficial.  Acute chronic volume overload. -Continue diuresis.  Lung nodules.. -Will need outpatient follow-up due to enlarging pulmonary nodules.  Eczema.  -Appears improved since starting on steroids.   Diabetes mellitus. -Continue metformin, continue sliding scale insulin as patient is currently on IV steroids.  -DVT prophylaxis with Lovenox. -Carb modified diet.  Date: 09/25/2015  MRN# [id number] Martha Woodard July 06, 1966  Referring Physician: Dr. Andrey Farmer is a 49 y.o. old female seen in consultation for chief complaint of:    Chief Complaint  Patient presents with  . Shortness of Breath    HPI:  The patient is a 49 year old Caucasian female, she tells me she has a history of COPD, being diagnosed in the last 1 year. She has not seen a pulmonary physician in the past. Patient was a previous 2 pack a day smoker. She quit smoking approximately October 2014, she previously worked in [employer], [employer]. She tells me that she went to the emergency room approximately one month ago with similar symptoms of acute onset dyspnea with wheezing with increasing cough at that time. She was treated with IV steroids, discharged with a course of oral prednisone and sent home. She initially felt much better, but then after the prednisone finished. She noted that her breathing became worse again. Her  symptoms progressed over the next several days and weeks until again, she started to feel that she was having difficulty ambulating. She notes that her baseline rest for status is moderate. She is able to drive a car and live independently clean her home, and ambulate around the supermarket with a cart without too much difficulty. However, at the last week. She is unable to do these things, she presented to the hospital with increasing dyspnea. Upon initial presentation to the emergency room and was found where her oxygen saturation was 73% on room air. Subsequent she was sent for CT of the chest to rule out pulmonary embolism. This showed no evidence of pulmonary emboli, however, it showed some small but enlarging lung nodules on the right lung, as well as diffuse air trapping. She was treated with initial dose of IV steroids and transferred to the intensive care unit, subsequently she tells me that she is feeling significant only better than at the time of admission today. She also notes that she has chronic facial rash which she notes is much better since receiving steroids.    PMHX:   Past Medical History  Diagnosis Date  . Sinus problem   . Diabetes mellitus without complication ([place])   . Hypertension   . Asthma   . COPD (chronic obstructive pulmonary disease) ([place])   . Multiple lung nodules on CT    Surgical Hx:  Past Surgical History  Procedure Laterality Date  . Wisdom tooth extraction    . Biopsy thyroid Right 02-06-14    NEGATIVE FOR MALIGNANT CELLS proteinaceous material and macrophages.  . Thryoid fna Right April 2015    Proteinaceous material and macrophages.   Family Hx:  Family History  Problem Relation Age of Onset  . Cancer Mother 36    uterian and ovarian   . Goiter Mother   . Stroke Father   . COPD Sister   . Stroke Brother   . Heart murmur Sister   .      Social Hx:   Social History  Substance Use Topics  . Smoking status: Former Smoker -- 33 years     Quit date: 07/10/2013  . Smokeless tobacco: None  . Alcohol Use: No   Medication:   Reviewed.    Allergies:  Augmentin  Review of Systems: Gen:  Denies  fever, sweats, chills HEENT: Denies blurred vision, double vision.  Cvc:  No dizziness, chest pain. Resp:   Denies cough or sputum porduction Gi: Denies swallowing difficulty, stomach pain. Gu:  Denies bladder incontinence, burning urine Ext:   No Joint pain, stiffness. Skin: No skin rash,  hives Endoc:  No polyuria, polydipsia. Psych: No depression, insomnia. Other:  All other systems were reviewed with the patient and were negative other that what is mentioned in the HPI.   Physical Examination:   VS: BP 163/71 mmHg  Pulse 87  Temp(Src) 98.2 F (36.8 C) (Oral)  Resp 25  Ht 5\' 4"  (1.626 m)  Wt 298 lb (135.172 kg)  BMI 51.13 kg/m2  SpO2 92%  General Appearance: No distress  Neuro:without focal findings,  speech normal,  HEENT: PERRLA, EOM intact.   Pulmonary: Bilateral scattered wheezing.  CardiovascularNormal S1,S2.  No m/r/g.   Abdomen: Benign, Soft, non-tender. Renal:  No costovertebral tenderness  GU:  No performed at this time. Endoc: No evident thyromegaly, no signs of acromegaly. Skin:   warm, no rashes, no ecchymosis  Extremities: normal, no cyanosis, clubbing.  Other findings:    LABORATORY PANEL:   CBC  Recent Labs Lab 09/25/15 0949  WBC 7.2  HGB 13.8  HCT 44.5  PLT 262   ------------------------------------------------------------------------------------------------------------------  Chemistries   Recent Labs Lab 09/25/15 0949  NA 135  K 3.5  CL 88*  CO2 39*  GLUCOSE 122*  BUN 11  CREATININE 0.50  CALCIUM 8.7*   ------------------------------------------------------------------------------------------------------------------  Cardiac Enzymes  Recent Labs Lab 09/25/15 0949  TROPONINI 0.06*   ------------------------------------------------------------  RADIOLOGY:  Dg  Chest Port 1 View  09/25/2015  CLINICAL DATA:  Shortness of breath for 4 days.  Chest pain. EXAM: PORTABLE CHEST 1 VIEW COMPARISON:  Chest radiograph June 27, 2014 and chest CT June 11, 2015 FINDINGS: There is no edema or consolidation. Heart is mildly enlarged with pulmonary vascularity within normal limits. No adenopathy. No pneumothorax. No bone lesions apparent. IMPRESSION: Mild cardiomegaly.  No edema or consolidation. Electronically Signed   By: Lowella Grip III M.D.   On: 09/25/2015 10:23       Thank  you for the consultation and for allowing [facility] to assist in the care of your patient. Our recommendations are noted above.  Please contact us if we can be of further service.   Marda Stalker, MD.  Board Certified in Internal Medicine, Pulmonary Medicine, [place], and Sleep Medicine.  [facility]   Patricia Pesa, M.D.  Vilinda Boehringer, M.D.  Merton Border, M.D

## 2015-09-25 NOTE — ED Notes (Addendum)
States she feel like she cant get a good breath for the past 3-4 days  But also had some chest and left arm pain 2 days ago

## 2015-09-25 NOTE — ED Notes (Signed)
Pt states she had a spasm in her left upper arm before christmas, states her hand is numb and tingling at this time.

## 2015-09-25 NOTE — ED Notes (Signed)
AAOx3.  Ambulated to BR.  Tolerated well.  No SOB/ DOE

## 2015-09-25 NOTE — Progress Notes (Signed)
Pharmacy changed Enoxaparin dose to 40mg  q12hrs per protocol based on CrCl of 114 ml/min with a BMI of 51.

## 2015-09-25 NOTE — ED Notes (Signed)
Respiratory therapist called to place pt on bipap.

## 2015-09-25 NOTE — H&P (Signed)
[place] [facility] [place] NAME: Martha Woodard    MR#:  [id number]  DATE OF BIRTH:  1966/02/19  DATE OF ADMISSION:  09/25/2015  PRIMARY CARE PHYSICIAN: Dicky Doe, MD   REQUESTING/REFERRING PHYSICIAN: Cephas Darby  CHIEF COMPLAINT:   Chief Complaint  Patient presents with  . Shortness of Breath    HISTORY OF PRESENT ILLNESS:  Martha Woodard  is a 49 y.o. female presented with shortness of breath going on since before Christmas. She's been coughing up whitish yellowish phlegm. She's been wheezing. She stated she had a decreased pulse ox and feels better since being put on the BiPAP mask. In the ER, her pulse ox was in the 70s. The patient states that she's been gaining weight 20 pounds over the past month. She feels that it's her COPD that's acting up. Hospitalist services were contacted for further evaluation.    PAST MEDICAL HISTORY:   Past Medical History  Diagnosis Date  . Sinus problem   . Diabetes mellitus without complication ([place])   . Hypertension   . Asthma   . COPD (chronic obstructive pulmonary disease) ([place])   . Multiple lung nodules on CT     PAST SURGICAL HISTORY:   Past Surgical History  Procedure Laterality Date  . Wisdom tooth extraction    . Biopsy thyroid Right 02-06-14    NEGATIVE FOR MALIGNANT CELLS proteinaceous material and macrophages.  . Thryoid fna Right April 2015    Proteinaceous material and macrophages.    SOCIAL HISTORY:   Social History  Substance Use Topics  . Smoking status: Former Smoker -- 14 years    Quit date: 07/10/2013  . Smokeless tobacco: Not on file  . Alcohol Use: No    FAMILY HISTORY:   Family History  Problem Relation Age of Onset  . Cancer Mother 10    uterian and ovarian   . Goiter Mother   . Stroke Father   . COPD Sister   . Stroke Brother   . Heart murmur Sister   .       DRUG ALLERGIES:   Allergies  Allergen Reactions  . Augmentin  [Amoxicillin-Pot Clavulanate] Itching    REVIEW OF SYSTEMS:  CONSTITUTIONAL: low-grade fever, chills and sweats. Positive for weight gain 20 pounds in one month. Positive for headache.   Positive for fatigue.  EYES: No blurred or double vision.  Wears glasses. EARS, NOSE, AND THROAT: No tinnitus or ear pain.  positive for sore throat.  RESPIRATORY: positive for  cough, shortness of breath  And wheezing.   No hemoptysis.  CARDIOVASCULAR: No chest pain, orthopnea, edema.  GASTROINTESTINAL: Positive for  nausea,  No vomiting, diarrhea.  Occasional abdominal pain. Occasional  blood in bowel movements with hemorrhoids  GENITOURINARY: No dysuria, hematuria.  ENDOCRINE: No polyuria, nocturia,  HEMATOLOGY: No anemia, easy bruising or bleeding SKIN: rash on the face MUSCULOSKELETAL: positive pain in the back and right shoulder and legs NEUROLOGIC: No tingling, numbness, weakness.  History of syncope. PSYCHIATRY: No anxiety or depression.   MEDICATIONS AT HOME:   Prior to Admission medications   Medication Sig Start Date End Date Taking? Authorizing Provider  albuterol (PROVENTIL) (2.5 MG/3ML) 0.083% nebulizer solution Take 3 mLs (2.5 mg total) by nebulization every 6 (six) hours as needed for wheezing or shortness of breath. 06/11/15  Yes Daymon Larsen, MD  atorvastatin (LIPITOR) 10 MG tablet Take 1 tablet (10 mg total) by mouth daily. 07/19/15  Yes  Arlis Porta., MD  DiphenhydrAMINE HCl (ALLERGY MED PO) Take 1-2 capsules by mouth every 4 (four) hours as needed.   Yes Historical Provider, MD  doxycycline (VIBRA-TABS) 100 MG tablet Take i capsule twice a day for 1 week, then once at bedtime thereafter. 07/02/15  Yes Arlis Porta., MD  fluticasone [facility]) 50 MCG/ACT nasal spray Place 1 spray into both nostrils daily.  03/22/14  Yes Historical Provider, MD  hydrochlorothiazide (HYDRODIURIL) 12.5 MG tablet Take 1 tablet (12.5 mg total) by mouth daily. 07/02/15  Yes Arlis Porta., MD   ibuprofen (ADVIL,MOTRIN) 200 MG tablet Take 200 mg by mouth every 6 (six) hours as needed.   Yes Historical Provider, MD  losartan (COZAAR) 25 MG tablet Take 1 tablet (25 mg total) by mouth daily. 07/09/15  Yes Arlis Porta., MD  metFORMIN (GLUCOPHAGE) 500 MG tablet Take 1 tablet (500 mg total) by mouth 2 (two) times daily with a meal. 07/02/15  Yes Arlis Porta., MD  metoprolol succinate (TOPROL-XL) 25 MG 24 hr tablet Take 1 tablet (25 mg total) by mouth daily. 07/02/15  Yes Arlis Porta., MD  Multiple Vitamin (MULTIVITAMIN) tablet Take 1 tablet by mouth daily.   Yes Historical Provider, MD  VENTOLIN HFA 108 (90 BASE) MCG/ACT inhaler INHALE 2 PUFFS BY MOUTH 4 TIMES DAILY AS NEEDED. 09/06/15  Yes Arlis Porta., MD      VITAL SIGNS:  Blood pressure 163/71, pulse 87, temperature 98.2 F (36.8 C), temperature source Oral, resp. rate 20, height 5\' 4"  (1.626 m), weight 135.172 kg (298 lb), SpO2 91 %.  PHYSICAL EXAMINATION:  GENERAL:  49 y.o.-year-old patient sitting in bed with BiPAP on.  EYES: Pupils equal, round, reactive to light and accommodation. No scleral icterus. Extraocular muscles intact.  HEENT: Head atraumatic, normocephalic. Oropharynx and nasopharynx clear.  NECK:  Supple, no jugular venous distention. No thyroid enlargement, no tenderness.  LUNGS: decreased  breath sounds bilaterally, slight expiratory  wheezing,  norales,rhonchi or crepitation. No use of accessory muscles of respiration.  CARDIOVASCULAR: S1, S2 normal. No murmurs, rubs, or gallops.  ABDOMEN: Soft, nontender, nondistended. Bowel sounds present. No organomegaly or mass.  EXTREMITIES:  3+pedal edema,  nocyanosis, or clubbing.  NEUROLOGIC: Cranial nerves II through XII are intact. Muscle strength 5/5 in all extremities. Sensation intact. Gait not checked.  PSYCHIATRIC: The patient is alert and oriented x 3.  SKIN:  No ulcers seen  LABORATORY PANEL:   CBC  Recent Labs Lab 09/25/15 0949   WBC 7.2  HGB 13.8  HCT 44.5  PLT 262   ------------------------------------------------------------------------------------------------------------------  Chemistries   Recent Labs Lab 09/25/15 0949  NA 135  K 3.5  CL 88*  CO2 39*  GLUCOSE 122*  BUN 11  CREATININE 0.50  CALCIUM 8.7*   ------------------------------------------------------------------------------------------------------------------  Cardiac Enzymes  Recent Labs Lab 09/25/15 0949  TROPONINI 0.06*   ------------------------------------------------------------------------------------------------------------------  RADIOLOGY:  Dg Chest Port 1 View  09/25/2015  CLINICAL DATA:  Shortness of breath for 4 days.  Chest pain. EXAM: PORTABLE CHEST 1 VIEW COMPARISON:  Chest radiograph June 27, 2014 and chest CT June 11, 2015 FINDINGS: There is no edema or consolidation. Heart is mildly enlarged with pulmonary vascularity within normal limits. No adenopathy. No pneumothorax. No bone lesions apparent. IMPRESSION: Mild cardiomegaly.  No edema or consolidation. Electronically Signed   By: Lowella Grip III M.D.   On: 09/25/2015 10:23    EKG:   Normal sinus  rhythm 92 bpm, premature atrial contractions, rightward axis.   IMPRESSION AND PLAN:   1. Acute hypoxic respiratory failure. Patient with pulse ox in the 70s on presentation. Patient was placed on BiPAP in order to oxygenate. Patient states that she is breathing better since the BiPAP was placed. ICU admission while on BiPAP. 2. COPD exacerbation will start Solu-Medrol and antibiotics and nebulizer treatments 3. Type 2 diabetes mellitus- sliding scale and Glucophage. Sugars will be high on steroids. 4. Morbid obesity with increased swelling- I will give 1 dose of IV Lasix today and switch her HIDA core thiazide over to Lasix on a daily basis. Check an echocardiogram. 5. Essential hypertension- blood pressure slightly elevated now, continue usual  medications 6. Lung nodules- on recent CT scan these nodules were increasing in size as per radiologist. Has followed with Dr. Genevive Bi in the past but has not followed since the recent CT scan. 7. Former smoker 8. Elevated troponin likely demand ischemia from acute hypoxic respiratory failure  All the records are reviewed and case discussed with ED provider. Management plans discussed with the patient, family and they are in agreement.  CODE STATUS: Full code  TOTAL TIME TAKING CARE OF THIS PATIENT: 55 minutes, patient will be admitted to the ICU.    Loletha Grayer M.D on 09/25/2015 at 2:21 PM  Between 7am to 6pm - Pager - [phone]  After 6pm call admission pager [facility]  Office  [phone]  CC: Primary care physician; Dicky Doe, MD

## 2015-09-25 NOTE — Progress Notes (Signed)
Notified E Link RN about ABG results from patient on nasal cannula. Patient alert and oriented and talking on cell phone. E Link RN to relay to Margaree Mackintosh MD.

## 2015-09-26 ENCOUNTER — Inpatient Hospital Stay: Payer: No Typology Code available for payment source

## 2015-09-26 LAB — EXPECTORATED SPUTUM ASSESSMENT W REFEX TO RESP CULTURE

## 2015-09-26 LAB — CBC
HCT: 44.9 % (ref 35.0–47.0)
Hemoglobin: 14 g/dL (ref 12.0–16.0)
MCH: 25 pg — AB (ref 26.0–34.0)
MCHC: 31.2 g/dL — AB (ref 32.0–36.0)
MCV: 80.3 fL (ref 80.0–100.0)
PLATELETS: 265 10*3/uL (ref 150–440)
RBC: 5.59 MIL/uL — ABNORMAL HIGH (ref 3.80–5.20)
RDW: 18 % — ABNORMAL HIGH (ref 11.5–14.5)
WBC: 8 10*3/uL (ref 3.6–11.0)

## 2015-09-26 LAB — BASIC METABOLIC PANEL
Anion gap: 9 (ref 5–15)
BUN: 14 mg/dL (ref 6–20)
CO2: 41 mmol/L — ABNORMAL HIGH (ref 22–32)
CREATININE: 0.54 mg/dL (ref 0.44–1.00)
Calcium: 8.9 mg/dL (ref 8.9–10.3)
Chloride: 88 mmol/L — ABNORMAL LOW (ref 101–111)
GFR calc Af Amer: >60 mL/min (ref >60)
Glucose, Bld: 214 mg/dL — ABNORMAL HIGH (ref 65–99)
Potassium: 3.7 mmol/L (ref 3.5–5.1)
SODIUM: 138 mmol/L (ref 135–145)

## 2015-09-26 LAB — GLUCOSE, CAPILLARY
GLUCOSE-CAPILLARY: 195 mg/dL — AB (ref 65–99)
Glucose-Capillary: 194 mg/dL — ABNORMAL HIGH (ref 65–99)
Glucose-Capillary: 141 mg/dL — ABNORMAL HIGH (ref 65–99)
Glucose-Capillary: 122 mg/dL — ABNORMAL HIGH (ref 65–99)

## 2015-09-26 LAB — EXPECTORATED SPUTUM ASSESSMENT W GRAM STAIN, RFLX TO RESP C

## 2015-09-26 LAB — FIBRIN DERIVATIVES D-DIMER (ARMC ONLY): Fibrin derivatives D-dimer (ARMC): 499 (ref 0–499)

## 2015-09-26 MED ORDER — BUDESONIDE 0.5 MG/2ML IN SUSP
0.5000 mg | Freq: Two times a day (BID) | RESPIRATORY_TRACT | Status: DC
  Administered 2015-09-26 – 2015-09-28 (×4): 0.5 mg via RESPIRATORY_TRACT
  Filled 2015-09-26 (×4): qty 2

## 2015-09-26 MED ORDER — TIOTROPIUM BROMIDE MONOHYDRATE 18 MCG IN CAPS
18.0000 ug | ORAL_CAPSULE | Freq: Every day | RESPIRATORY_TRACT | Status: DC
  Administered 2015-09-26 – 2015-10-01 (×6): 18 ug via RESPIRATORY_TRACT
  Filled 2015-09-26 (×2): qty 5

## 2015-09-26 MED ORDER — MOMETASONE FURO-FORMOTEROL FUM 100-5 MCG/ACT IN AERO
2.0000 | INHALATION_SPRAY | Freq: Two times a day (BID) | RESPIRATORY_TRACT | Status: DC
  Administered 2015-09-26 – 2015-10-01 (×11): 2 via RESPIRATORY_TRACT
  Filled 2015-09-26: qty 8.8

## 2015-09-26 MED ORDER — ALBUTEROL SULFATE (2.5 MG/3ML) 0.083% IN NEBU
2.5000 mg | INHALATION_SOLUTION | Freq: Four times a day (QID) | RESPIRATORY_TRACT | Status: DC
  Administered 2015-09-26 – 2015-10-01 (×20): 2.5 mg via RESPIRATORY_TRACT
  Filled 2015-09-26 (×22): qty 3

## 2015-09-26 NOTE — Progress Notes (Signed)
[place] [facility] [place] NAME: Martha Woodard    MR#:  [id number]  DATE OF BIRTH:  1966/05/12  SUBJECTIVE:  CHIEF COMPLAINT:   Chief Complaint  Patient presents with  . Shortness of Breath   patient is 49 year old Caucasian female with history of asthma, COPD, diabetes, hypertension who presents to the hospital with complaints of shortness of breath, coughing up yellow phlegm as well as wheezing. She was admitted to the hospital for further evaluation and treatment. She feels a little bit better today, although requires quite a lot of oxygen, at 5 L at present, down from 6 on admission.   Review of Systems  Constitutional: Negative for fever, chills and weight loss.  HENT: Negative for congestion.   Eyes: Negative for blurred vision and double vision.  Respiratory: Positive for cough, sputum production, shortness of breath and wheezing.   Cardiovascular: Negative for chest pain, palpitations, orthopnea, leg swelling and PND.  Gastrointestinal: Negative for nausea, vomiting, abdominal pain, diarrhea, constipation and blood in stool.  Genitourinary: Negative for dysuria, urgency, frequency and hematuria.  Musculoskeletal: Negative for falls.  Neurological: Negative for dizziness, tremors, focal weakness and headaches.  Endo/Heme/Allergies: Does not bruise/bleed easily.  Psychiatric/Behavioral: Negative for depression. The patient does not have insomnia.     VITAL SIGNS: Blood pressure 122/93, pulse 93, temperature 98.2 F (36.8 C), temperature source Oral, resp. rate 23, height 5\' 4"  (1.626 m), weight 135.172 kg (298 lb), SpO2 93 %.  PHYSICAL EXAMINATION:   GENERAL:  49 y.o.-year-old patient sitting in the bed with no acute distress. Flushed in the face EYES: Pupils equal, round, reactive to light and accommodation. No scleral icterus. Extraocular muscles intact.  HEENT: Head atraumatic, normocephalic. Oropharynx and nasopharynx clear.   NECK:  Supple, no jugular venous distention. No thyroid enlargement, no tenderness.  LUNGS: Normal breath sounds bilaterally, no wheezing, rales,rhonchi or crepitation. No use of accessory muscles of respiration.  CARDIOVASCULAR: S1, S2 normal. No murmurs, rubs, or gallops.  ABDOMEN: Soft, minimal discomfort in upper abdomen with palpation superficially, but no rebound or guarding was noted, nondistended. Bowel sounds present. No organomegaly or mass.  EXTREMITIES: 2+ leg and pedal edema bilaterally, no cyanosis, or clubbing.  NEUROLOGIC: Cranial nerves II through XII are intact. Muscle strength 5/5 in all extremities. Sensation intact. Gait not checked.  PSYCHIATRIC: The patient is alert and oriented x 3.  SKIN: No obvious rash, lesion, or ulcer.   ORDERS/RESULTS REVIEWED:   CBC  Recent Labs Lab 09/25/15 0949 09/26/15 0410  WBC 7.2 8.0  HGB 13.8 14.0  HCT 44.5 44.9  PLT 262 265  MCV 78.4* 80.3  MCH 24.3* 25.0*  MCHC 31.0* 31.2*  RDW 18.3* 18.0*  LYMPHSABS 1.0  --   MONOABS 0.6  --   EOSABS 0.1  --   BASOSABS 0.0  --    ------------------------------------------------------------------------------------------------------------------  Chemistries   Recent Labs Lab 09/25/15 0949 09/26/15 0410  NA 135 138  K 3.5 3.7  CL 88* 88*  CO2 39* 41*  GLUCOSE 122* 214*  BUN 11 14  CREATININE 0.50 0.54  CALCIUM 8.7* 8.9   ------------------------------------------------------------------------------------------------------------------ estimated creatinine clearance is 114.1 mL/min (by C-G formula based on Cr of 0.54). ------------------------------------------------------------------------------------------------------------------ No results for input(s): TSH, T4TOTAL, T3FREE, THYROIDAB in the last 72 hours.  Invalid input(s): FREET3  Cardiac Enzymes  Recent Labs Lab 09/25/15 0949 09/25/15 1509 09/25/15 1904  TROPONINI 0.06* 0.09* 0.03    ------------------------------------------------------------------------------------------------------------------ Invalid input(s): POCBNP ---------------------------------------------------------------------------------------------------------------  RADIOLOGY: Dg Chest Port 1 View  09/25/2015  CLINICAL DATA:  Shortness of breath for 4 days.  Chest pain. EXAM: PORTABLE CHEST 1 VIEW COMPARISON:  Chest radiograph June 27, 2014 and chest CT June 11, 2015 FINDINGS: There is no edema or consolidation. Heart is mildly enlarged with pulmonary vascularity within normal limits. No adenopathy. No pneumothorax. No bone lesions apparent. IMPRESSION: Mild cardiomegaly.  No edema or consolidation. Electronically Signed   By: Lowella Grip III M.D.   On: 09/25/2015 10:23    EKG:  Orders placed or performed during the hospital encounter of 09/25/15  . EKG 12-Lead  . EKG 12-Lead  . ED EKG  . ED EKG    ASSESSMENT AND PLAN:  Active Problems:   Acute respiratory failure with hypoxia (HCC) 1.  acute respiratory failure with hypoxia and hypercapnia, likely due to COPD exacerbation, rule out PE, get d-dimer, continue steroids and inhalation therapy and as well as nebulizers, getting a Doppler ultrasound of lower extremities to rule out DVT, weaning off oxygen as tolerated. Patient was not on oxygen at home 2. COPD exacerbation. Continue steroids, nebulizers and inhalation therapy. Follow clinically 3. Acute bronchitis, now on Zithromax and get sputum cultures for possible 4.  bilateral lower extremity edema, get Doppler ultrasound to rule out a DVT, getting d-dimer   Management plans discussed with the patient, family and they are in agreement.   DRUG ALLERGIES:  Allergies  Allergen Reactions  . Augmentin [Amoxicillin-Pot Clavulanate] Itching    CODE STATUS:     Code Status Orders        Start     Ordered   09/25/15 1206  Full code   Continuous     09/25/15 1205       TOTAL TIME TAKING CARE OF THIS PATIENT: 40 minutes.    Theodoro Grist M.D on 09/26/2015 at 1:34 PM  Between 7am to 6pm - Pager - [phone]  After 6pm go to [email or web address] - password [email or web address]  Office  [phone]  CC: Primary care physician; Dicky Doe, MD

## 2015-09-26 NOTE — Consult Note (Signed)
  [facility] Consultation     49 year old female presents with acute respiratory failure, likely due to acute exacerbation of COPD.   Assessment and Plan: OK to transfer to gen med floor  Acute respiratory failure secondary to acute exacerbation of COPD. -Continue IV steroids-will start to wean  Acute bronchitis, with acute exacerbation of COPD. -Continue current antibiotics. -Check sputum cultures. -started dulera and spiriva  Morbid obesity with possible obesity hypoventilation syndrome. -will start nocturnal BiPAP. -Weight loss would be beneficial.  Acute chronic volume overload. -Continue diuresis.  Lung nodules.. -Will need outpatient follow-up due to enlarging pulmonary nodules.  Date: 09/26/2015  MRN# [id number] Martha Woodard 1966-08-24  Referring Physician: Dr. Andrey Farmer is a 49 y.o. old female seen in consultation for chief complaint of:    Chief Complaint  Patient presents with  . Shortness of Breath    HPI: SOb much improved, deneis CP No fevers, chills, still with productive cough    Allergies:  Augmentin  Review of Systems: Gen:  Denies  fever, sweats, chills HEENT: Denies blurred vision, double vision.  Cvc:  No dizziness, chest pain. Resp:  +cough  +sputum porduction Gi: Denies swallowing difficulty, stomach pain. Gu:  Denies bladder incontinence, burning urine Ext:   No Joint pain, stiffness. Skin: No skin rash,  hives Endoc:  No polyuria, polydipsia. Psych: No depression, insomnia. Other:  All other systems were reviewed with the patient and were negative  Physical Examination:   VS: BP 122/93 mmHg  Pulse 93  Temp(Src) 98.2 F (36.8 C) (Oral)  Resp 23  Ht 5\' 4"  (1.626 m)  Wt 298 lb (135.172 kg)  BMI 51.13 kg/m2  SpO2 91%  General Appearance: No distress  Neuro:without focal findings,  speech normal,  HEENT: PERRLA, EOM intact.   Pulmonary: Bilateral scattered wheezing.  CardiovascularNormal  S1,S2.  No m/r/g.   Abdomen: Benign, Soft, non-tender. Renal:  No costovertebral tenderness  GU:  No performed at this time. Endoc: No evident thyromegaly, no signs of acromegaly. Skin:   warm, no rashes, no ecchymosis  Extremities: normal, no cyanosis, clubbing.     LABORATORY PANEL:   CBC  Recent Labs Lab 09/26/15 0410  WBC 8.0  HGB 14.0  HCT 44.9  PLT 265   ------------------------------------------------------------------------------------------------------------------  Chemistries   Recent Labs Lab 09/26/15 0410  NA 138  K 3.7  CL 88*  CO2 41*  GLUCOSE 214*  BUN 14  CREATININE 0.54  CALCIUM 8.9   ------------------------------------------------------------------------------------------------------------------  Cardiac Enzymes  Recent Labs Lab 09/25/15 1904  TROPONINI 0.03   ------------------------------------------------------------  RADIOLOGY:  Dg Chest Port 1 View  09/25/2015  CLINICAL DATA:  Shortness of breath for 4 days.  Chest pain. EXAM: PORTABLE CHEST 1 VIEW COMPARISON:  Chest radiograph June 27, 2014 and chest CT June 11, 2015 FINDINGS: There is no edema or consolidation. Heart is mildly enlarged with pulmonary vascularity within normal limits. No adenopathy. No pneumothorax. No bone lesions apparent. IMPRESSION: Mild cardiomegaly.  No edema or consolidation. Electronically Signed   By: Lowella Grip III M.D.   On: 09/25/2015 10:23    The Patient requires high complexity decision making for assessment and support, frequent evaluation and titration of therapies.   Patient are satisfied with Plan of action and management. All questions answered  Corrin Parker, M.D.  [facility]  Medical Director [place] Director [facility] Cardio-Pulmonary Department

## 2015-09-26 NOTE — Care Management (Signed)
Patient admitted to icu due to the need for continuous bipap.  At present, she is on nasal cannula

## 2015-09-26 NOTE — Progress Notes (Signed)
Received from CCU pt one assist to bathroom  No complaints abd folds red nystatin powder applied alone with interdry

## 2015-09-26 NOTE — Progress Notes (Signed)
Pt transported to ultrasound.

## 2015-09-26 NOTE — Progress Notes (Signed)
PT Cancellation Note  Patient Details Name: Martha Woodard MRN: [id number] DOB: [birth year]   Cancelled Treatment:    Reason Eval/Treat Not Completed: Other (comment) (Doppler ultrasound B LE's ordered to r/o DVT.  Will hold PT at this time and re-attempt PT eval after these results are known and pt is medically appropriate for PT participation.)   Leitha Bleak 09/26/2015, 1:53 PM Leitha Bleak, [place]

## 2015-09-26 NOTE — Progress Notes (Signed)
   09/26/15 0949  Clinical Encounter Type  Visited With Patient  Visit Type Initial;Spiritual support  Consult/Referral To Chaplain  Spiritual Encounters  Spiritual Needs Literature;Brochure  Stress Factors  Patient Stress Factors None identified  Chaplain rounded in the unit and responded to an order in the system for the patient. The patient was given an AD packet and given the education. Provided a compassionate presence and asked her to contact the nurse if she wanted to complete while hospitalized. Chaplain Dawid Dupriest A. Felecia Stanfill Ext. [phone]

## 2015-09-27 ENCOUNTER — Inpatient Hospital Stay: Payer: No Typology Code available for payment source

## 2015-09-27 ENCOUNTER — Encounter: Payer: Self-pay | Admitting: Radiology

## 2015-09-27 LAB — BASIC METABOLIC PANEL
Anion gap: 10 (ref 5–15)
BUN: 17 mg/dL (ref 6–20)
CO2: 45 mmol/L — ABNORMAL HIGH (ref 22–32)
Calcium: 9 mg/dL (ref 8.9–10.3)
Chloride: 84 mmol/L — ABNORMAL LOW (ref 101–111)
Creatinine, Ser: 0.54 mg/dL (ref 0.44–1.00)
GFR calc Af Amer: >60 mL/min (ref >60)
GFR calc non Af Amer: >60 mL/min (ref >60)
GLUCOSE: 164 mg/dL — AB (ref 65–99)
POTASSIUM: 3.7 mmol/L (ref 3.5–5.1)
Sodium: 139 mmol/L (ref 135–145)

## 2015-09-27 LAB — GLUCOSE, CAPILLARY
GLUCOSE-CAPILLARY: 137 mg/dL — AB (ref 65–99)
GLUCOSE-CAPILLARY: 223 mg/dL — AB (ref 65–99)
GLUCOSE-CAPILLARY: 124 mg/dL — AB (ref 65–99)
GLUCOSE-CAPILLARY: 113 mg/dL — AB (ref 65–99)

## 2015-09-27 MED ORDER — SUCRALFATE 1 GM/10ML PO SUSP
1.0000 g | Freq: Three times a day (TID) | ORAL | Status: DC
  Administered 2015-09-27 – 2015-10-01 (×16): 1 g via ORAL
  Filled 2015-09-27 (×16): qty 10

## 2015-09-27 MED ORDER — PANTOPRAZOLE SODIUM 40 MG PO TBEC
40.0000 mg | DELAYED_RELEASE_TABLET | Freq: Every day | ORAL | Status: DC
  Administered 2015-09-27 – 2015-10-01 (×5): 40 mg via ORAL
  Filled 2015-09-27 (×5): qty 1

## 2015-09-27 MED ORDER — NYSTATIN 100000 UNIT/ML MT SUSP
5.0000 mL | Freq: Four times a day (QID) | OROMUCOSAL | Status: DC
  Administered 2015-09-27 – 2015-10-01 (×17): 500000 [IU] via OROMUCOSAL
  Filled 2015-09-27 (×18): qty 5

## 2015-09-27 MED ORDER — HYDROCHLOROTHIAZIDE 12.5 MG PO CAPS
12.5000 mg | ORAL_CAPSULE | Freq: Every day | ORAL | Status: DC
  Administered 2015-09-27 – 2015-10-01 (×5): 12.5 mg via ORAL
  Filled 2015-09-27 (×5): qty 1

## 2015-09-27 MED ORDER — IOHEXOL 350 MG/ML SOLN
100.0000 mL | Freq: Once | INTRAVENOUS | Status: AC | PRN
  Administered 2015-09-27: 100 mL via INTRAVENOUS

## 2015-09-27 NOTE — Progress Notes (Signed)
PT Cancellation Note  Patient Details Name: Martha Woodard MRN: [id number] DOB: 03/04/1966   Cancelled Treatment:    Reason Eval/Treat Not Completed: Other (comment) (Pt with order for CT angio of chest to r/o PE.)  Will hold PT eval at this time and re-attempt PT eval after these results are known and pt is medically appropriate to participate in PT.   Raquel Sarna Callaway Hardigree 09/27/2015, 2:46 PM Leitha Bleak, [place]

## 2015-09-27 NOTE — Progress Notes (Signed)
Dr.Vaickute took patient of oxygen, room air oxygen sat 76%, placed on oxygen at 4L per [place]- 91%. Will continue to monitor the patient.

## 2015-09-27 NOTE — Progress Notes (Signed)
Orderly here to take patient to CT.

## 2015-09-27 NOTE — Progress Notes (Signed)
[facility] [place] NAME: Martha Woodard    MR#:  [id number]  DATE OF BIRTH:  Aug 14, 1966  SUBJECTIVE:  CHIEF COMPLAINT:   Chief Complaint  Patient presents with  . Shortness of Breath   patient is 49 year old Caucasian female with history of asthma, COPD, diabetes, hypertension who presents to the hospital with complaints of shortness of breath, coughing up yellow phlegm as well as wheezing. She was admitted to the hospital for further evaluation and treatment. She feels poorly, however, does not feel short of breath, although patient's O2 sats dropped down to 70s on room air    Review of Systems  Constitutional: Negative for fever, chills and weight loss.  HENT: Negative for congestion.   Eyes: Negative for blurred vision and double vision.  Respiratory: Positive for cough, sputum production, shortness of breath and wheezing.   Cardiovascular: Negative for chest pain, palpitations, orthopnea, leg swelling and PND.  Gastrointestinal: Negative for nausea, vomiting, abdominal pain, diarrhea, constipation and blood in stool.  Genitourinary: Negative for dysuria, urgency, frequency and hematuria.  Musculoskeletal: Negative for falls.  Neurological: Negative for dizziness, tremors, focal weakness and headaches.  Endo/Heme/Allergies: Does not bruise/bleed easily.  Psychiatric/Behavioral: Negative for depression. The patient does not have insomnia.     VITAL SIGNS: Blood pressure 152/70, pulse 92, temperature 98.2 F (36.8 C), temperature source Oral, resp. rate 20, height 5\' 4"  (1.626 m), weight 135.172 kg (298 lb), SpO2 91 %.  PHYSICAL EXAMINATION:   GENERAL:  49 y.o.-year-old patient sitting in the bed with no acute distress. Flushed in the face EYES: Pupils equal, round, reactive to light and accommodation. No scleral icterus. Extraocular muscles intact.  HEENT: Head atraumatic, normocephalic. Oropharynx and nasopharynx clear.   NECK:  Supple, no jugular venous distention. No thyroid enlargement, no tenderness.  LUNGS: Normal breath sounds bilaterally, no wheezing, rales,rhonchi or crepitation. No use of accessory muscles of respiration.  CARDIOVASCULAR: S1, S2 normal. No murmurs, rubs, or gallops.  ABDOMEN: Soft, minimal discomfort in upper abdomen with palpation superficially, but no rebound or guarding was noted, nondistended. Bowel sounds present. No organomegaly or mass.  EXTREMITIES: 2+ leg and pedal edema bilaterally, no cyanosis, or clubbing.  NEUROLOGIC: Cranial nerves II through XII are intact. Muscle strength 5/5 in all extremities. Sensation intact. Gait not checked.  PSYCHIATRIC: The patient is alert and oriented x 3.  SKIN: No obvious rash, lesion, or ulcer.   ORDERS/RESULTS REVIEWED:   CBC  Recent Labs Lab 09/25/15 0949 09/26/15 0410  WBC 7.2 8.0  HGB 13.8 14.0  HCT 44.5 44.9  PLT 262 265  MCV 78.4* 80.3  MCH 24.3* 25.0*  MCHC 31.0* 31.2*  RDW 18.3* 18.0*  LYMPHSABS 1.0  --   MONOABS 0.6  --   EOSABS 0.1  --   BASOSABS 0.0  --    ------------------------------------------------------------------------------------------------------------------  Chemistries   Recent Labs Lab 09/25/15 0949 09/26/15 0410  NA 135 138  K 3.5 3.7  CL 88* 88*  CO2 39* 41*  GLUCOSE 122* 214*  BUN 11 14  CREATININE 0.50 0.54  CALCIUM 8.7* 8.9   ------------------------------------------------------------------------------------------------------------------ estimated creatinine clearance is 114.1 mL/min (by C-G formula based on Cr of 0.54). ------------------------------------------------------------------------------------------------------------------ No results for input(s): TSH, T4TOTAL, T3FREE, THYROIDAB in the last 72 hours.  Invalid input(s): FREET3  Cardiac Enzymes  Recent Labs Lab 09/25/15 0949 09/25/15 1509 09/25/15 1904  TROPONINI 0.06* 0.09* 0.03    ------------------------------------------------------------------------------------------------------------------ Invalid input(s): POCBNP ---------------------------------------------------------------------------------------------------------------  RADIOLOGY: Dg  Esophagus  09/27/2015  CLINICAL DATA:  Shortness of breath. Feels like food is getting stuck in the pocket on the left side of the throat. EXAM: ESOPHOGRAM / BARIUM SWALLOW / BARIUM TABLET STUDY TECHNIQUE: Combined double contrast and single contrast examination performed using effervescent crystals, thick barium liquid, and thin barium liquid. The patient was observed with fluoroscopy swallowing a 13 mm barium sulphate tablet. FLUOROSCOPY TIME:  Radiation Exposure Index (as provided by the fluoroscopic device): 15.3 mGy COMPARISON:  None. FINDINGS: There was normal pharyngeal anatomy and motility. Contrast flowed freely through the esophagus without evidence of stricture or mass. There was normal esophageal mucosa without evidence of irregularity or ulceration. Esophageal motility was normal. No evidence of reflux. No definite hiatal hernia was demonstrated. At the end of the examination a 13 mm barium tablet was administered which transited through the esophagus and esophagogastric junction without delay. IMPRESSION: Normal barium swallow. Electronically Signed   By: Kathreen Devoid   On: 09/27/2015 12:42   US Venous Img Lower Bilateral  09/26/2015  CLINICAL DATA:  Swelling. EXAM: BILATERAL LOWER EXTREMITY VENOUS DOPPLER ULTRASOUND TECHNIQUE: Gray-scale sonography with graded compression, as well as color Doppler and duplex ultrasound were performed to evaluate the lower extremity deep venous systems from the level of the common femoral vein and including the common femoral, femoral, profunda femoral, popliteal and calf veins including the posterior tibial, peroneal and gastrocnemius veins when visible. The superficial great saphenous vein  was also interrogated. Spectral Doppler was utilized to evaluate flow at rest and with distal augmentation maneuvers in the common femoral, femoral and popliteal veins. COMPARISON:  None. FINDINGS: RIGHT LOWER EXTREMITY Common Femoral Vein: No evidence of thrombus. Normal compressibility, respiratory phasicity and response to augmentation. Saphenofemoral Junction: No evidence of thrombus. Normal compressibility and flow on color Doppler imaging. Profunda Femoral Vein: No evidence of thrombus. Normal compressibility and flow on color Doppler imaging. Femoral Vein: No evidence of thrombus. Normal compressibility, respiratory phasicity and response to augmentation. Popliteal Vein: No evidence of thrombus. Normal compressibility, respiratory phasicity and response to augmentation. Calf Veins: No evidence of thrombus. Normal compressibility and flow on color Doppler imaging. Superficial Great Saphenous Vein: No evidence of thrombus. Normal compressibility and flow on color Doppler imaging. Other Findings:  None. LEFT LOWER EXTREMITY Common Femoral Vein: No evidence of thrombus. Normal compressibility, respiratory phasicity and response to augmentation. Saphenofemoral Junction: No evidence of thrombus. Normal compressibility and flow on color Doppler imaging. Profunda Femoral Vein: No evidence of thrombus. Normal compressibility and flow on color Doppler imaging. Femoral Vein: No evidence of thrombus. Normal compressibility, respiratory phasicity and response to augmentation. Popliteal Vein: No evidence of thrombus. Normal compressibility, respiratory phasicity and response to augmentation. Calf Veins: No evidence of thrombus. Normal compressibility and flow on color Doppler imaging. Superficial Great Saphenous Vein: No evidence of thrombus. Normal compressibility and flow on color Doppler imaging. Other Findings:  None. IMPRESSION: No evidence of deep venous thrombosis. Electronically Signed   By: Marcello Moores  Register   On:  09/26/2015 17:03    EKG:  Orders placed or performed during the hospital encounter of 09/25/15  . EKG 12-Lead  . EKG 12-Lead  . ED EKG  . ED EKG    ASSESSMENT AND PLAN:  Active Problems:   Acute respiratory failure with hypoxia (HCC) 1.  acute respiratory failure with hypoxia and hypercapnia, suspected due to COPD exacerbation, rule out PE, get the angiogram of the chest to rule out pulmonary embolism. Although patient's d-dimerwas unremarkable below 500 ,  continue steroids and inhalation therapy and as well as nebulizers, Doppler ultrasound of lower extremities showed no DVT, weaning off oxygen as tolerated. Patient was not on oxygen at home 2. COPD exacerbation. Continue steroids, nebulizers and inhalation therapy. Stable clinically, weaning off oxygen 3. Acute bronchitis, now on Zithromax and  attempted to obtain sputum cultures , but not acceptable for testing 4.  bilateral lower extremity edema, negative lower extremity  ultrasound    Management plans discussed with the patient, family and they are in agreement.   DRUG ALLERGIES:  Allergies  Allergen Reactions  . Augmentin [Amoxicillin-Pot Clavulanate] Itching    CODE STATUS:     Code Status Orders        Start     Ordered   09/25/15 1206  Full code   Continuous     09/25/15 1205      TOTAL TIME TAKING CARE OF THIS PATIENT: 40 minutes.    Theodoro Grist M.D on 09/27/2015 at 1:36 PM  Between 7am to 6pm - Pager - [phone]  After 6pm go to [email or web address] - password [email or web address] [facility]  Office  [phone]  CC: Primary care physician; Dicky Doe, MD

## 2015-09-28 ENCOUNTER — Inpatient Hospital Stay (HOSPITAL_COMMUNITY)
Admit: 2015-09-28 | Discharge: 2015-09-28 | Disposition: A | Discharge status: Home / Self Care | Payer: No Typology Code available for payment source | Attending: Internal Medicine | Admitting: Internal Medicine

## 2015-09-28 DIAGNOSIS — R0600 Dyspnea, unspecified: Secondary | ICD-10-CM

## 2015-09-28 LAB — GLUCOSE, CAPILLARY
Glucose-Capillary: 140 mg/dL — ABNORMAL HIGH (ref 65–99)
Glucose-Capillary: 261 mg/dL — ABNORMAL HIGH (ref 65–99)
Glucose-Capillary: 106 mg/dL — ABNORMAL HIGH (ref 65–99)
Glucose-Capillary: 139 mg/dL — ABNORMAL HIGH (ref 65–99)

## 2015-09-28 LAB — CREATININE, SERUM: Creatinine, Ser: 0.54 mg/dL (ref 0.44–1.00)

## 2015-09-28 MED ORDER — FUROSEMIDE 40 MG PO TABS: 40.0000 mg | ORAL_TABLET | Freq: Two times a day (BID) | ORAL | Status: DC

## 2015-09-28 MED ORDER — PREDNISONE 20 MG PO TABS
30.0000 mg | ORAL_TABLET | Freq: Two times a day (BID) | ORAL | Status: DC
  Administered 2015-09-28 – 2015-10-01 (×6): 30 mg via ORAL
  Filled 2015-09-28 (×6): qty 2

## 2015-09-28 MED ORDER — FUROSEMIDE 10 MG/ML IJ SOLN
20.0000 mg | Freq: Two times a day (BID) | INTRAMUSCULAR | Status: DC
  Administered 2015-09-28 – 2015-09-30 (×6): 20 mg via INTRAVENOUS
  Filled 2015-09-28 (×7): qty 2

## 2015-09-28 MED ORDER — POTASSIUM CHLORIDE CRYS ER 20 MEQ PO TBCR
40.0000 meq | EXTENDED_RELEASE_TABLET | Freq: Every day | ORAL | Status: AC
  Administered 2015-09-28 – 2015-09-29 (×2): 40 meq via ORAL
  Filled 2015-09-28 (×2): qty 2

## 2015-09-28 NOTE — Progress Notes (Signed)
Patient up and ambulating in hallway with PT. On oxygen at 4L per [place].

## 2015-09-28 NOTE — Progress Notes (Signed)
* [facility]     Assessment and Plan:  Acute respiratory failure secondary to acute exacerbation of COPD. -We will Wean down IV steroids, her wheezing seems to be significantly improved, and steroids can potentially exacerbate volume overload.  Acute bronchitis, with acute exacerbation of COPD. -Continue current antibiotics. -We'll discontinue nebulized Pulmicort as this may be contributing to thrush, can continue Dulera. -Will need outpatient pulmonary follow-up in regards to her obesity hypoventilation as well as COPD, and lung nodules.  Morbid obesity with possible obesity hypoventilation syndrome. -Weight loss would be beneficial. -Chronic hypercapnic respiratory failure with PCO2 of 61. -Given her routine COPD exacerbation.  -She is intolerant to BiPAP, though she would be willing to try to get in. We discussed that we could revisit this. Should she have recurrent admissions due to COPD/hypoventilation. -Will need outpatient evaluation for possible sleep apnea.  Acute chronic volume overload. -Continue diuresis.  Lung nodules.. -Will need outpatient follow-up due to enlarging pulmonary nodules.  Eczema.  -Appears improved since starting on steroids.   Diabetes mellitus. -Continue to monitor as the patient is currently on IV steroids.  Pulmonary service will sign off for now. Please call if there are any further questions or concerns. We will set the patient to be seen in our outpatient pulmonary clinic.  Date: 09/28/2015  MRN# [id number] Martha Woodard 1966/08/03   Martha Woodard is a 49 y.o. old female seen in follow up for chief complaint of  Chief Complaint  Patient presents with  . Shortness of Breath     HPI:  Since the patient's admission, she has significantly improved in terms of her breathing. I reviewed her sputum cultures which were negative thus far, but not acceptable for testing. She has remained afebrile, she is being  maintained on nasal cannula at 4 L. Currently, her medication regimen includes azithromycin, Dulera, albuterol, Pulmicort, Solu-Medrol 40 mg every 8, Spiriva, and nystatin for thrush.  Her ABG at the time of admission showed a pH of 7.37 with a PCO2 of 79 and a PO2 of 61, bicarbonate is 45.7, this is indicative of chronic hypercapnic respiratory failure.  Medication:   Medications reviewed  Allergies:  Augmentin  Review of Systems: Gen:  Denies  fever, sweats. HEENT: Denies blurred vision. Cvc:  No dizziness, chest pain or heaviness Resp:   Denies cough or sputum porduction. Gi: Denies swallowing difficulty, stomach pain.  Gu:  Denies bladder incontinence, burning urine Ext:   No Joint pain, stiffness. Skin: No skin rash, easy bruising. Endoc:  No polyuria, polydipsia. Psych: No depression, insomnia. Other:  All other systems were reviewed and found to be negative other than what is mentioned in the HPI.   Physical Examination:   VS: BP 163/70 mmHg  Pulse 85  Temp(Src) 98.5 F (36.9 C) (Oral)  Resp 20  Ht 5\' 4"  (1.626 m)  Wt 298 lb (135.172 kg)  BMI 51.13 kg/m2  SpO2 84%  General Appearance: No distress  Neuro:without focal findings,  speech normal,  HEENT: PERRLA, EOM intact. Pulmonary: normal breath sounds, No wheezing.   CardiovascularNormal S1,S2.  No m/r/g.   Abdomen: Benign, Soft, non-tender. Renal:  No costovertebral tenderness  GU:  Not performed at this time. Endoc: No evident thyromegaly, no signs of acromegaly. Skin:   warm, no rash. Extremities: normal, no cyanosis, clubbing.   LABORATORY PANEL:   CBC  Recent Labs Lab 09/26/15 0410  WBC 8.0  HGB 14.0  HCT 44.9  PLT 265   ------------------------------------------------------------------------------------------------------------------  Chemistries   Recent Labs Lab 09/27/15 1355 09/28/15 0607  NA 139  --   K 3.7  --   CL 84*  --   CO2 45*  --   GLUCOSE 164*  --   BUN 17  --     CREATININE 0.54 0.54  CALCIUM 9.0  --    ------------------------------------------------------------------------------------------------------------------  Cardiac Enzymes  Recent Labs Lab 09/25/15 1904  TROPONINI 0.03   ------------------------------------------------------------  RADIOLOGY:   No results found for this or any previous visit. No results found for this or any previous visit. ------------------------------------------------------------------------------------------------------------------  Thank  you for allowing [facility] to assist in the care of your patient. Our recommendations are noted above.  Please contact us if we can be of further service.   Marda Stalker, MD.  [facility] Office Number: [phone]  Patricia Pesa, M.D.  Vilinda Boehringer, M.D.  Merton Border, M.D

## 2015-09-28 NOTE — Progress Notes (Signed)
*  PRELIMINARY RESULTS* Echocardiogram 2D Echocardiogram has been performed.  Martha Woodard 09/28/2015, 4:13 PM

## 2015-09-28 NOTE — Progress Notes (Signed)
[facility] [place] NAME: Martha Woodard    MR#:  [id number]  DATE OF BIRTH:  [birth year]  SUBJECTIVE:  CHIEF COMPLAINT:   Chief Complaint  Patient presents with  . Shortness of Breath   patient is 49 year old Caucasian female with history of asthma, COPD, diabetes, hypertension who presents to the hospital with complaints of shortness of breath, coughing up yellow phlegm as well as wheezing. She was admitted to the hospital for further evaluation and treatment. Patient was initiated on antibiotic therapy as well as diuretic since CT scan of the chest revealed congestive heart failure but no PE. She feels a little bit better today.   Review of Systems  Constitutional: Negative for fever, chills and weight loss.  HENT: Negative for congestion.   Eyes: Negative for blurred vision and double vision.  Respiratory: Positive for cough, sputum production, shortness of breath and wheezing.   Cardiovascular: Negative for chest pain, palpitations, orthopnea, leg swelling and PND.  Gastrointestinal: Negative for nausea, vomiting, abdominal pain, diarrhea, constipation and blood in stool.  Genitourinary: Negative for dysuria, urgency, frequency and hematuria.  Musculoskeletal: Negative for falls.  Neurological: Negative for dizziness, tremors, focal weakness and headaches.  Endo/Heme/Allergies: Does not bruise/bleed easily.  Psychiatric/Behavioral: Negative for depression. The patient does not have insomnia.     VITAL SIGNS: Blood pressure 163/70, pulse 90, temperature 98.5 F (36.9 C), temperature source Oral, resp. rate 20, height 5\' 4"  (1.626 m), weight 135.172 kg (298 lb), SpO2 94 %.  PHYSICAL EXAMINATION:   GENERAL:  49 y.o.-year-old patient sitting in the bed with no acute distress. Flushed in the face EYES: Pupils equal, round, reactive to light and accommodation. No scleral icterus. Extraocular muscles intact.  HEENT: Head atraumatic,  normocephalic. Oropharynx and nasopharynx clear.  NECK:  Supple, no jugular venous distention. No thyroid enlargement, no tenderness.  LUNGS: Normal breath sounds bilaterally, no wheezing, but few rales,rhonchi and crepitations noted. No use of accessory muscles of respiration.  CARDIOVASCULAR: S1, S2 normal. No murmurs, rubs, or gallops.  ABDOMEN: Soft, minimal discomfort in upper abdomen with palpation superficially, but no rebound or guarding was noted, nondistended. Bowel sounds present. No organomegaly or mass.  EXTREMITIES: 2+ leg and pedal edema bilaterally, no cyanosis, or clubbing.  NEUROLOGIC: Cranial nerves II through XII are intact. Muscle strength 5/5 in all extremities. Sensation intact. Gait not checked.  PSYCHIATRIC: The patient is alert and oriented x 3.  SKIN: No obvious rash, lesion, or ulcer.   ORDERS/RESULTS REVIEWED:   CBC  Recent Labs Lab 09/25/15 0949 09/26/15 0410  WBC 7.2 8.0  HGB 13.8 14.0  HCT 44.5 44.9  PLT 262 265  MCV 78.4* 80.3  MCH 24.3* 25.0*  MCHC 31.0* 31.2*  RDW 18.3* 18.0*  LYMPHSABS 1.0  --   MONOABS 0.6  --   EOSABS 0.1  --   BASOSABS 0.0  --    ------------------------------------------------------------------------------------------------------------------  Chemistries   Recent Labs Lab 09/25/15 0949 09/26/15 0410 09/27/15 1355 09/28/15 0607  NA 135 138 139  --   K 3.5 3.7 3.7  --   CL 88* 88* 84*  --   CO2 39* 41* 45*  --   GLUCOSE 122* 214* 164*  --   BUN 11 14 17   --   CREATININE 0.50 0.54 0.54 0.54  CALCIUM 8.7* 8.9 9.0  --    ------------------------------------------------------------------------------------------------------------------ estimated creatinine clearance is 114.1 mL/min (by C-G formula based on Cr of 0.54). ------------------------------------------------------------------------------------------------------------------ No  results for input(s): TSH, T4TOTAL, T3FREE, THYROIDAB in the last 72  hours.  Invalid input(s): FREET3  Cardiac Enzymes  Recent Labs Lab 09/25/15 0949 09/25/15 1509 09/25/15 1904  TROPONINI 0.06* 0.09* 0.03   ------------------------------------------------------------------------------------------------------------------ Invalid input(s): POCBNP ---------------------------------------------------------------------------------------------------------------  RADIOLOGY: Ct Angio Chest Pe W/cm &/or Wo Cm  09/27/2015  CLINICAL DATA:  Shortness of breath.  Productive cough. EXAM: CT ANGIOGRAPHY CHEST WITH CONTRAST TECHNIQUE: Multidetector CT imaging of the chest was performed using the standard protocol during bolus administration of intravenous contrast. Multiplanar CT image reconstructions and MIPs were obtained to evaluate the vascular anatomy. CONTRAST:  156mL OMNIPAQUE IOHEXOL 350 MG/ML SOLN COMPARISON:  Portable chest dated 09/25/2015 and chest CTA dated 06/11/2015. Chest CT at [facility] dated 02/17/2014. FINDINGS: Mediastinum/Lymph Nodes: No pulmonary emboli or thoracic aortic dissection identified. No masses or pathologically enlarged lymph nodes identified. Lungs/Pleura: Increased patchy interstitial prominence in both lungs. Mild bullous changes. The previously demonstrated 9 mm right lower lobe pulmonary nodule is unchanged on image number 86. Additional, multiple smaller, small bilateral lower lobe nodules are no longer visualized. Some of these may be obscured by interval atelectasis. The previously demonstrated 14 mm right middle lobe nodule continues to measure 14 mm in maximum diameter today on image number 88 and appears more elongated and less nodular. Upper abdomen: No acute findings. Musculoskeletal: Thoracic spine degenerative changes. Review of the MIP images confirms the above findings. IMPRESSION: 1. No pulmonary emboli. 2. Bilateral lower lobe atelectasis. 3. Interval interstitial pulmonary edema or mild interstitial pneumonitis  superimposed on mild changes of COPD. 4. The 2 larger right lung nodules are unchanged in maximum diameter. The right middle lobe nodular appears more linear today. As previously discussed, these could be further evaluated with a PET-CT once the patient's current infectious episode is resolved. Electronically Signed   By: Claudie Revering M.D.   On: 09/27/2015 15:42   Dg Esophagus  09/27/2015  CLINICAL DATA:  Shortness of breath. Feels like food is getting stuck in the pocket on the left side of the throat. EXAM: ESOPHOGRAM / BARIUM SWALLOW / BARIUM TABLET STUDY TECHNIQUE: Combined double contrast and single contrast examination performed using effervescent crystals, thick barium liquid, and thin barium liquid. The patient was observed with fluoroscopy swallowing a 13 mm barium sulphate tablet. FLUOROSCOPY TIME:  Radiation Exposure Index (as provided by the fluoroscopic device): 15.3 mGy COMPARISON:  None. FINDINGS: There was normal pharyngeal anatomy and motility. Contrast flowed freely through the esophagus without evidence of stricture or mass. There was normal esophageal mucosa without evidence of irregularity or ulceration. Esophageal motility was normal. No evidence of reflux. No definite hiatal hernia was demonstrated. At the end of the examination a 13 mm barium tablet was administered which transited through the esophagus and esophagogastric junction without delay. IMPRESSION: Normal barium swallow. Electronically Signed   By: Kathreen Devoid   On: 09/27/2015 12:42   US Venous Img Lower Bilateral  09/26/2015  CLINICAL DATA:  Swelling. EXAM: BILATERAL LOWER EXTREMITY VENOUS DOPPLER ULTRASOUND TECHNIQUE: Gray-scale sonography with graded compression, as well as color Doppler and duplex ultrasound were performed to evaluate the lower extremity deep venous systems from the level of the common femoral vein and including the common femoral, femoral, profunda femoral, popliteal and calf veins including the  posterior tibial, peroneal and gastrocnemius veins when visible. The superficial great saphenous vein was also interrogated. Spectral Doppler was utilized to evaluate flow at rest and with distal augmentation maneuvers in the common femoral, femoral and popliteal  veins. COMPARISON:  None. FINDINGS: RIGHT LOWER EXTREMITY Common Femoral Vein: No evidence of thrombus. Normal compressibility, respiratory phasicity and response to augmentation. Saphenofemoral Junction: No evidence of thrombus. Normal compressibility and flow on color Doppler imaging. Profunda Femoral Vein: No evidence of thrombus. Normal compressibility and flow on color Doppler imaging. Femoral Vein: No evidence of thrombus. Normal compressibility, respiratory phasicity and response to augmentation. Popliteal Vein: No evidence of thrombus. Normal compressibility, respiratory phasicity and response to augmentation. Calf Veins: No evidence of thrombus. Normal compressibility and flow on color Doppler imaging. Superficial Great Saphenous Vein: No evidence of thrombus. Normal compressibility and flow on color Doppler imaging. Other Findings:  None. LEFT LOWER EXTREMITY Common Femoral Vein: No evidence of thrombus. Normal compressibility, respiratory phasicity and response to augmentation. Saphenofemoral Junction: No evidence of thrombus. Normal compressibility and flow on color Doppler imaging. Profunda Femoral Vein: No evidence of thrombus. Normal compressibility and flow on color Doppler imaging. Femoral Vein: No evidence of thrombus. Normal compressibility, respiratory phasicity and response to augmentation. Popliteal Vein: No evidence of thrombus. Normal compressibility, respiratory phasicity and response to augmentation. Calf Veins: No evidence of thrombus. Normal compressibility and flow on color Doppler imaging. Superficial Great Saphenous Vein: No evidence of thrombus. Normal compressibility and flow on color Doppler imaging. Other Findings:  None.  IMPRESSION: No evidence of deep venous thrombosis. Electronically Signed   By: Marcello Moores  Register   On: 09/26/2015 17:03    EKG:  Orders placed or performed during the hospital encounter of 09/25/15  . EKG 12-Lead  . EKG 12-Lead  . ED EKG  . ED EKG    ASSESSMENT AND PLAN:  Active Problems:   Acute respiratory failure with hypoxia (HCC) 1.  acute respiratory failure with hypoxia and hypercapnia, suspected due to COPD exacerbation, acute CHF , but no PE. Continue steroids and inhalation therapy and as well as nebulizers, Doppler ultrasound of lower extremities showed no DVT, weaning off oxygen as tolerated. Patient was not on oxygen at home, he'll 4 L at home. Initiating high doses of intravenous Lasix, following ins and outs as well as patient's weight and oxygenation. Suspect obstructive sleep apnea as a cause of intermittent desaturations, patient would benefit from outpatient sleep study to rule out obstructive sleep apnea as well 2. COPD exacerbation. Continue steroids, nebulizers and inhalation therapy. Stable clinically, weaning off oxygen 3. Acute bronchitis, continue Zithromax and  attempted to obtain sputum cultures , but not acceptable for testing, less rails since initiation of diuretics. Suspect fluid overload.  4.  bilateral lower extremity edema, negative lower extremity  ultrasound for DVT, continue diuretic. Follow clinically 5. Acute congestive heart failure with pulmonary edema, continue diuretics. Follow ins and outs as well as weight and kidney function, getting the echocardiogram 6. Obesity with intermittent desaturations concerning for obstructive sleep apnea, would benefit from outpatient sleep study as above   Management plans discussed with the patient, family and they are in agreement.   DRUG ALLERGIES:  Allergies  Allergen Reactions  . Augmentin [Amoxicillin-Pot Clavulanate] Itching    CODE STATUS:     Code Status Orders        Start     Ordered    09/25/15 1206  Full code   Continuous     09/25/15 1205      TOTAL TIME TAKING CARE OF THIS PATIENT: 40 minutes.    Theodoro Grist M.D on 09/28/2015 at 12:43 PM  Between 7am to 6pm - Pager - [phone]  After 6pm go to [email or web address] -  password [email or web address] [facility]  [facility]  Office  [phone]  CC: Primary care physician; Dicky Doe, MD

## 2015-09-28 NOTE — Evaluation (Signed)
Physical Therapy Evaluation Patient Details Name: Martha Woodard MRN: [id number] DOB: 27-Nov-1965 Today's Date: 09/28/2015   History of Present Illness  Pt is a 49 y.o. female presenting with SOB since before Christmas, wheezing, coughing up whitish yellowish phlegm.  Pt found to be in 70's in ED and initially placed on BiPAP.  Pt admitted with acute respiratory failure likely d/t acute COPD exacerbation.  PMH includes metastatic disease, DM, htn, COPD (no home O2), multiple lung nodules on CT.  Clinical Impression  Prior to admission, pt was independent without AD (no home O2).  Pt lives alone in 1 level home with stairs to enter.  Currently pt is independent with transfers and CGA with ambulation 120 feet with RW.  Pt feels like she needs to use RW currently d/t LE edema but reports she does not think she will need one when she is ready to discharge home.  Pt's O2 saturations on 4 L/minute via nasal cannula fluctuating between 84-90 percent with activity (91-92% at rest) depending on level of exertion and amount of talking pt was doing.  Pt would benefit from skilled PT to address noted impairments and functional limitations.  Recommend pt discharge to home with support of family/friends as needed when medically appropriate; anticipate no further PT needs upon discharge (except pt may benefit from OP pulmonary rehab).     Follow Up Recommendations No PT follow up    Equipment Recommendations  Rolling walker with 5" wheels (pending pt's progress)    Recommendations for Other Services       Precautions / Restrictions Precautions Precautions: Fall Restrictions Weight Bearing Restrictions: No      Mobility  Bed Mobility Overal bed mobility: Modified Independent             General bed mobility comments: Supine to/from sit with HOB elevated  Transfers Overall transfer level: Needs assistance Equipment used: None Transfers: Sit to/from [employer] Transfers Sit to Stand:  Independent Stand pivot transfers: Independent       General transfer comment: steady without loss of balance  Ambulation/Gait Ambulation/Gait assistance: Min guard Ambulation Distance (Feet): 120 Feet Assistive device: Rolling walker (2 wheeled) Gait Pattern/deviations: WFL(Within Functional Limits);Step-through pattern Gait velocity: mild decrease   General Gait Details: SOB noted with activity; steady without loss of balance  Stairs            Wheelchair Mobility    Modified Rankin (Stroke Patients Only)       Balance Overall balance assessment: Needs assistance Sitting-balance support: No upper extremity supported;Feet supported Sitting balance-Leahy Scale: Normal     Standing balance support: Bilateral upper extremity supported (on RW) Standing balance-Leahy Scale: Good                               Pertinent Vitals/Pain Pain Assessment: No/denies pain    Home Living Family/patient expects to be discharged to:: Private residence Living Arrangements: Alone     Home Access: Stairs to enter Entrance Stairs-Rails: Psychiatric nurse of Steps: 8 Home Layout: One level Home Equipment: None      Prior Function Level of Independence: Independent               Hand Dominance        Extremity/Trunk Assessment   Upper Extremity Assessment: Generalized weakness           Lower Extremity Assessment: Generalized weakness  Communication   Communication: No difficulties  Cognition Arousal/Alertness: Awake/alert Behavior During Therapy: WFL for tasks assessed/performed Overall Cognitive Status: Within Functional Limits for tasks assessed                      General Comments   Nursing cleared pt for participation in physical therapy.  Pt agreeable to PT session.    Exercises        Assessment/Plan    PT Assessment Patient needs continued PT services  PT Diagnosis Difficulty  walking;Generalized weakness   PT Problem List Cardiopulmonary status limiting activity;Decreased balance;Decreased mobility  PT Treatment Interventions DME instruction;Gait training;Stair training;Functional mobility training;Therapeutic activities;Therapeutic exercise;Balance training;Patient/family education   PT Goals (Current goals can be found in the Care Plan section) Acute Rehab PT Goals Patient Stated Goal: to go home PT Goal Formulation: With patient Time For Goal Achievement: 10/12/15 Potential to Achieve Goals: Good    Frequency Min 2X/week   Barriers to discharge        Co-evaluation               End of Session Equipment Utilized During Treatment: Gait belt;Oxygen Activity Tolerance:  (Limited d/t O2 desaturation with activity.) Patient left: in bed;with call bell/phone within reach;with bed alarm set;with family/visitor present Nurse Communication: Mobility status (O2 desaturation with activity)         Time: [id number] PT Time Calculation (min) (ACUTE ONLY): 25 min   Charges:   PT Evaluation $Initial PT Evaluation Tier I: 1 Procedure     PT G CodesLeitha Bleak 10/08/2015, 12:48 PM Leitha Bleak, [place]

## 2015-09-28 NOTE — Clinical Documentation Improvement (Signed)
Hospitalist  Can the diagnosis of CHF be further specified?    Acuity - Acute, Chronic, Acute on Chronic   Type - Systolic, Diastolic, Systolic and Diastolic  Other  Clinically Undetermined   Document any associated diagnoses/conditions   Supporting Information: CT scan of chest revealed CHF per 12/30 progress notes. Acute CHF with pulmonary edema, continue diuresis per 12/30 progress notes. 12/27:  BNP: 251.0.   Please exercise your independent, professional judgment when responding. A specific answer is not anticipated or expected.   Thank You,  [place] [phone]

## 2015-09-28 NOTE — Progress Notes (Addendum)
SATURATION QUALIFICATIONS: (This note is used to comply with regulatory documentation for home oxygen)  Patient Saturations on Room Air at Rest = 86%  Patient Saturations on Room Air while Ambulating = 81%  Patient Saturations on 4Liters of oxygen while Ambulating = 91%  Please briefly explain why patient needs home oxygen: desat with activity on room air.

## 2015-09-29 LAB — GLUCOSE, CAPILLARY
GLUCOSE-CAPILLARY: 120 mg/dL — AB (ref 65–99)
GLUCOSE-CAPILLARY: 158 mg/dL — AB (ref 65–99)
Glucose-Capillary: 107 mg/dL — ABNORMAL HIGH (ref 65–99)
Glucose-Capillary: 127 mg/dL — ABNORMAL HIGH (ref 65–99)

## 2015-09-29 MED ORDER — NITROGLYCERIN 2 % TD OINT
0.5000 [in_us] | TOPICAL_OINTMENT | Freq: Four times a day (QID) | TRANSDERMAL | Status: DC
  Administered 2015-09-29 – 2015-10-01 (×8): 0.5 [in_us] via TOPICAL
  Filled 2015-09-29 (×7): qty 1

## 2015-09-29 NOTE — Progress Notes (Signed)
[name] NAME: Martha Woodard    MR#:  [id number]  DATE OF BIRTH:  05-May-1966  SUBJECTIVE:  CHIEF COMPLAINT:   Chief Complaint  Patient presents with  . Shortness of Breath   patient is 49 year old Caucasian female with history of asthma, COPD, diabetes, hypertension who presents to the hospital with complaints of shortness of breath, coughing up yellow phlegm as well as wheezing. She was admitted to the hospital for further evaluation and treatment. Patient was initiated on antibiotic therapy as well as diuretic since CT scan of the chest revealed congestive heart failure but no PE. She feels a little bit better today. Being weaned off oxygen to room air. However, O2 sats were 85% on room air on exertion, back on 3 L of oxygen through nasal cannula. Denies any significant discomfort or shortness of breath at present. Ins and outs are not reported  Review of Systems  Constitutional: Negative for fever, chills and weight loss.  HENT: Negative for congestion.   Eyes: Negative for blurred vision and double vision.  Respiratory: Positive for cough, sputum production, shortness of breath and wheezing.   Cardiovascular: Negative for chest pain, palpitations, orthopnea, leg swelling and PND.  Gastrointestinal: Negative for nausea, vomiting, abdominal pain, diarrhea, constipation and blood in stool.  Genitourinary: Negative for dysuria, urgency, frequency and hematuria.  Musculoskeletal: Negative for falls.  Neurological: Negative for dizziness, tremors, focal weakness and headaches.  Endo/Heme/Allergies: Does not bruise/bleed easily.  Psychiatric/Behavioral: Negative for depression. The patient does not have insomnia.     VITAL SIGNS: Blood pressure 164/71, pulse 80, temperature 98.2 F (36.8 C), temperature source Oral, resp. rate 18, height 5\' 4"  (1.626 m), weight 135.172 kg (298 lb), SpO2 95 %.  PHYSICAL EXAMINATION:    GENERAL:  49 y.o.-year-old patient sitting in the bed with no acute distress. Flushed in the face EYES: Pupils equal, round, reactive to light and accommodation. No scleral icterus. Extraocular muscles intact.  HEENT: Head atraumatic, normocephalic. Oropharynx and nasopharynx clear.  NECK:  Supple, no jugular venous distention. No thyroid enlargement, no tenderness.  LUNGS: Diminished breath sounds bilaterally, no wheezing, but few rales,rhonchi and crepitations noted. No use of accessory muscles of respiration.  CARDIOVASCULAR: S1, S2 normal. No murmurs, rubs, or gallops.  ABDOMEN: Soft, minimal discomfort in upper abdomen with palpation superficially, but no rebound or guarding was noted, nondistended. Bowel sounds present. No organomegaly or mass.  EXTREMITIES: Trace leg and pedal edema bilaterally, no cyanosis, or clubbing.  NEUROLOGIC: Cranial nerves II through XII are intact. Muscle strength 5/5 in all extremities. Sensation intact. Gait not checked.  PSYCHIATRIC: The patient is alert and oriented x 3.  SKIN: No obvious rash, lesion, or ulcer.   ORDERS/RESULTS REVIEWED:   CBC  Recent Labs Lab 09/25/15 0949 09/26/15 0410  WBC 7.2 8.0  HGB 13.8 14.0  HCT 44.5 44.9  PLT 262 265  MCV 78.4* 80.3  MCH 24.3* 25.0*  MCHC 31.0* 31.2*  RDW 18.3* 18.0*  LYMPHSABS 1.0  --   MONOABS 0.6  --   EOSABS 0.1  --   BASOSABS 0.0  --    ------------------------------------------------------------------------------------------------------------------  Chemistries   Recent Labs Lab 09/25/15 0949 09/26/15 0410 09/27/15 1355 09/28/15 0607  NA 135 138 139  --   K 3.5 3.7 3.7  --   CL 88* 88* 84*  --   CO2 39* 41* 45*  --   GLUCOSE 122* 214* 164*  --  BUN 11 14 17   --   CREATININE 0.50 0.54 0.54 0.54  CALCIUM 8.7* 8.9 9.0  --    ------------------------------------------------------------------------------------------------------------------ estimated creatinine clearance is  114.1 mL/min (by C-G formula based on Cr of 0.54). ------------------------------------------------------------------------------------------------------------------ No results for input(s): TSH, T4TOTAL, T3FREE, THYROIDAB in the last 72 hours.  Invalid input(s): FREET3  Cardiac Enzymes  Recent Labs Lab 09/25/15 0949 09/25/15 1509 09/25/15 1904  TROPONINI 0.06* 0.09* 0.03   ------------------------------------------------------------------------------------------------------------------ Invalid input(s): POCBNP ---------------------------------------------------------------------------------------------------------------  RADIOLOGY: Ct Angio Chest Pe W/cm &/or Wo Cm  09/27/2015  CLINICAL DATA:  Shortness of breath.  Productive cough. EXAM: CT ANGIOGRAPHY CHEST WITH CONTRAST TECHNIQUE: Multidetector CT imaging of the chest was performed using the standard protocol during bolus administration of intravenous contrast. Multiplanar CT image reconstructions and MIPs were obtained to evaluate the vascular anatomy. CONTRAST:  151mL OMNIPAQUE IOHEXOL 350 MG/ML SOLN COMPARISON:  Portable chest dated 09/25/2015 and chest CTA dated 06/11/2015. Chest CT at [facility] dated 02/17/2014. FINDINGS: Mediastinum/Lymph Nodes: No pulmonary emboli or thoracic aortic dissection identified. No masses or pathologically enlarged lymph nodes identified. Lungs/Pleura: Increased patchy interstitial prominence in both lungs. Mild bullous changes. The previously demonstrated 9 mm right lower lobe pulmonary nodule is unchanged on image number 86. Additional, multiple smaller, small bilateral lower lobe nodules are no longer visualized. Some of these may be obscured by interval atelectasis. The previously demonstrated 14 mm right middle lobe nodule continues to measure 14 mm in maximum diameter today on image number 88 and appears more elongated and less nodular. Upper abdomen: No acute findings. Musculoskeletal: Thoracic  spine degenerative changes. Review of the MIP images confirms the above findings. IMPRESSION: 1. No pulmonary emboli. 2. Bilateral lower lobe atelectasis. 3. Interval interstitial pulmonary edema or mild interstitial pneumonitis superimposed on mild changes of COPD. 4. The 2 larger right lung nodules are unchanged in maximum diameter. The right middle lobe nodular appears more linear today. As previously discussed, these could be further evaluated with a PET-CT once the patient's current infectious episode is resolved. Electronically Signed   By: Claudie Revering M.D.   On: 09/27/2015 15:42    EKG:  Orders placed or performed during the hospital encounter of 09/25/15  . EKG 12-Lead  . EKG 12-Lead  . ED EKG  . ED EKG    ASSESSMENT AND PLAN:  Active Problems:   Acute respiratory failure with hypoxia (HCC) 1.  acute respiratory failure with hypoxia and hypercapnia, due to COPD exacerbation, acute diastolic CHF , but no PE. Tapering steroids and continue inhalation therapy and as well as nebulizers, Doppler ultrasound of lower extremities showed no DVT, weaning off oxygen as tolerated. Patient was not on oxygen at home, she is at 3 L now. Continue high doses of intravenous Lasix, following ins and outs as well as patient's weight and oxygenation. Suspect obstructive sleep apnea as a cause of intermittent desaturations, patient would benefit from outpatient sleep study to rule out obstructive sleep apnea as well. Stable today, following closely clinically 2. COPD exacerbation. Continue steroids, nebulizers and inhalation therapy. Stable clinically, weaning off oxygen 3. Acute bronchitis, continue Zithromax and  attempted to obtain sputum cultures , but not acceptable for testing 4.  bilateral lower extremity edema, negative lower extremity  ultrasound for DVT, continue diuretic. Improved clinically 5. Acute diastolic congestive heart failure with pulmonary edema, continue diuretics. Follow ins and outs as  well as weight and kidney function,  echocardiogram was observed 6. Obesity with intermittent desaturations concerning for obstructive sleep  apnea, would benefit from outpatient sleep study as above   Management plans discussed with the patient, family and they are in agreement.   DRUG ALLERGIES:  Allergies  Allergen Reactions  . Augmentin [Amoxicillin-Pot Clavulanate] Itching    CODE STATUS:     Code Status Orders        Start     Ordered   09/25/15 1206  Full code   Continuous     09/25/15 1205      TOTAL TIME TAKING CARE OF THIS PATIENT: 35 minutes.    Theodoro Grist M.D on 09/29/2015 at 12:12 PM  Between 7am to 6pm - Pager - [phone]  After 6pm go to [email or web address] - password [email or web address] [facility]  Office  [phone]  CC: Primary care physician; Dicky Doe, MD

## 2015-09-29 NOTE — Progress Notes (Signed)
SATURATION QUALIFICATIONS: (This note is used to comply with regulatory documentation for home oxygen)  Patient Saturations on Room Air at Rest = 92%  (4L O2)  Patient Saturations on Room Air while Ambulating = 84%  Patient Saturations on 4 Liters of oxygen while Ambulating = 87%  Please briefly explain why patient needs home oxygen: Desaturation with activity, removing oxygen.

## 2015-09-30 ENCOUNTER — Inpatient Hospital Stay: Payer: No Typology Code available for payment source

## 2015-09-30 LAB — BASIC METABOLIC PANEL
ANION GAP: 8 (ref 5–15)
BUN: 21 mg/dL — AB (ref 6–20)
CALCIUM: 9.1 mg/dL (ref 8.9–10.3)
CO2: 45 mmol/L — ABNORMAL HIGH (ref 22–32)
CREATININE: 0.6 mg/dL (ref 0.44–1.00)
Chloride: 86 mmol/L — ABNORMAL LOW (ref 101–111)
GFR calc Af Amer: >60 mL/min (ref >60)
GLUCOSE: 108 mg/dL — AB (ref 65–99)
Potassium: 4 mmol/L (ref 3.5–5.1)
Sodium: 139 mmol/L (ref 135–145)

## 2015-09-30 LAB — GLUCOSE, CAPILLARY
GLUCOSE-CAPILLARY: 139 mg/dL — AB (ref 65–99)
Glucose-Capillary: 89 mg/dL (ref 65–99)
Glucose-Capillary: 148 mg/dL — ABNORMAL HIGH (ref 65–99)
Glucose-Capillary: 132 mg/dL — ABNORMAL HIGH (ref 65–99)

## 2015-09-30 LAB — CBC
HEMATOCRIT: 47.5 % — AB (ref 35.0–47.0)
HEMOGLOBIN: 14.6 g/dL (ref 12.0–16.0)
MCH: 24.5 pg — ABNORMAL LOW (ref 26.0–34.0)
MCHC: 30.7 g/dL — AB (ref 32.0–36.0)
MCV: 79.7 fL — ABNORMAL LOW (ref 80.0–100.0)
PLATELETS: 225 10*3/uL (ref 150–440)
RBC: 5.96 MIL/uL — AB (ref 3.80–5.20)
RDW: 18.1 % — AB (ref 11.5–14.5)
WBC: 7.5 10*3/uL (ref 3.6–11.0)

## 2015-09-30 LAB — URINALYSIS COMPLETE WITH MICROSCOPIC (ARMC ONLY)
BACTERIA UA: NONE SEEN
Bilirubin Urine: NEGATIVE
GLUCOSE, UA: NEGATIVE mg/dL
HGB URINE DIPSTICK: NEGATIVE
Ketones, ur: NEGATIVE mg/dL
Leukocytes, UA: NEGATIVE
Nitrite: NEGATIVE
PH: 8 (ref 5.0–8.0)
Protein, ur: NEGATIVE mg/dL
RBC / HPF: NONE SEEN RBC/hpf (ref 0–5)
Specific Gravity, Urine: 1.008 (ref 1.005–1.030)

## 2015-09-30 LAB — CULTURE, BLOOD (ROUTINE X 2)
CULTURE: NO GROWTH
Culture: NO GROWTH

## 2015-09-30 MED ORDER — LOSARTAN POTASSIUM 25 MG PO TABS
25.0000 mg | ORAL_TABLET | Freq: Two times a day (BID) | ORAL | Status: DC
  Administered 2015-09-30 – 2015-10-01 (×2): 25 mg via ORAL
  Filled 2015-09-30 (×2): qty 1

## 2015-09-30 NOTE — Progress Notes (Signed)
[facility] [place] NAME: Martha Woodard    MR#:  [id number]  DATE OF BIRTH:  06-Nov-1965  SUBJECTIVE:  CHIEF COMPLAINT:   Chief Complaint  Patient presents with  . Shortness of Breath   patient is 50 year old Caucasian female with history of asthma, COPD, diabetes, hypertension who presents to the hospital with complaints of shortness of breath, coughing up yellow phlegm as well as wheezing. She was admitted to the hospital for further evaluation and treatment. Patient was initiated on antibiotic therapy as well as diuretic since CT scan of the chest revealed congestive heart failure but no PE. She feels a little bit better today. Being weaned off oxygen to room air. However, O2 sats were 85% on room air on exertion, back on 3 L of oxygen through nasal cannula. Denies any significant discomfort or shortness of breath at present. Ins and outs are not reported Remains on 4 L of oxygen through nasal cannula. Today in the morning. Feels comfortable. He denies significant shortness of breath. Pulmonary recommends to try BiPAP again, since he suspects that the hypoxia is related to hypoventilation/COPD, obstructive sleep apnea/obesity hypoventilation  Review of Systems  Constitutional: Negative for fever, chills and weight loss.  HENT: Negative for congestion.   Eyes: Negative for blurred vision and double vision.  Respiratory: Positive for cough, sputum production, shortness of breath and wheezing.   Cardiovascular: Negative for chest pain, palpitations, orthopnea, leg swelling and PND.  Gastrointestinal: Negative for nausea, vomiting, abdominal pain, diarrhea, constipation and blood in stool.  Genitourinary: Negative for dysuria, urgency, frequency and hematuria.  Musculoskeletal: Negative for falls.  Neurological: Negative for dizziness, tremors, focal weakness and headaches.  Endo/Heme/Allergies: Does not bruise/bleed easily.   Psychiatric/Behavioral: Negative for depression. The patient does not have insomnia.     VITAL SIGNS: Blood pressure 142/72, pulse 83, temperature 98.2 F (36.8 C), temperature source Oral, resp. rate 17, height 5\' 4"  (1.626 m), weight 134.038 kg (295 lb 8 oz), SpO2 95 %.  PHYSICAL EXAMINATION:   GENERAL:  50 y.o.-year-old patient sitting in the bed with no acute distress. Flushed in the face EYES: Pupils equal, round, reactive to light and accommodation. No scleral icterus. Extraocular muscles intact.  HEENT: Head atraumatic, normocephalic. Oropharynx and nasopharynx clear.  NECK:  Supple, no jugular venous distention. No thyroid enlargement, no tenderness.  LUNGS: Diminished breath sounds bilaterally, no wheezing, but few rales,rhonchi and crepitations noted. No use of accessory muscles of respiration.  CARDIOVASCULAR: S1, S2 normal. No murmurs, rubs, or gallops.  ABDOMEN: Soft, minimal discomfort in upper abdomen with palpation superficially, but no rebound or guarding was noted, nondistended. Bowel sounds present. No organomegaly or mass.  EXTREMITIES: Trace leg and pedal edema bilaterally, no cyanosis, or clubbing.  NEUROLOGIC: Cranial nerves II through XII are intact. Muscle strength 5/5 in all extremities. Sensation intact. Gait not checked.  PSYCHIATRIC: The patient is alert and oriented x 3.  SKIN: No obvious rash, lesion, or ulcer.   ORDERS/RESULTS REVIEWED:   CBC  Recent Labs Lab 09/25/15 0949 09/26/15 0410 09/30/15 0439  WBC 7.2 8.0 7.5  HGB 13.8 14.0 14.6  HCT 44.5 44.9 47.5*  PLT 262 265 225  MCV 78.4* 80.3 79.7*  MCH 24.3* 25.0* 24.5*  MCHC 31.0* 31.2* 30.7*  RDW 18.3* 18.0* 18.1*  LYMPHSABS 1.0  --   --   MONOABS 0.6  --   --   EOSABS 0.1  --   --   BASOSABS  0.0  --   --    ------------------------------------------------------------------------------------------------------------------  Chemistries   Recent Labs Lab 09/25/15 0949 09/26/15 0410  09/27/15 1355 09/28/15 0607 09/30/15 0439  NA 135 138 139  --  139  K 3.5 3.7 3.7  --  4.0  CL 88* 88* 84*  --  86*  CO2 39* 41* 45*  --  45*  GLUCOSE 122* 214* 164*  --  108*  BUN 11 14 17   --  21*  CREATININE 0.50 0.54 0.54 0.54 0.60  CALCIUM 8.7* 8.9 9.0  --  9.1   ------------------------------------------------------------------------------------------------------------------ estimated creatinine clearance is 113.5 mL/min (by C-G formula based on Cr of 0.6). ------------------------------------------------------------------------------------------------------------------ No results for input(s): TSH, T4TOTAL, T3FREE, THYROIDAB in the last 72 hours.  Invalid input(s): FREET3  Cardiac Enzymes  Recent Labs Lab 09/25/15 0949 09/25/15 1509 09/25/15 1904  TROPONINI 0.06* 0.09* 0.03   ------------------------------------------------------------------------------------------------------------------ Invalid input(s): POCBNP ---------------------------------------------------------------------------------------------------------------  RADIOLOGY: No results found.  EKG:  Orders placed or performed during the hospital encounter of 09/25/15  . EKG 12-Lead  . EKG 12-Lead  . ED EKG  . ED EKG    ASSESSMENT AND PLAN:  Active Problems:   Acute respiratory failure with hypoxia (HCC) 1.  acute respiratory failure with hypoxia and hypercapnia, due to COPD exacerbation, acute diastolic CHF, obstructive sleep apnea/obesity hypoventilation syndrome , but no PE. Tapering steroids and continue inhalation therapy and as well as nebulizers, Doppler ultrasound of lower extremities showed no DVT, weaning off oxygen as tolerated. Patient was not on oxygen at home, she is at 3 L now. Continue intravenous Lasix, following ins and outs as well as patient's weight and oxygenation. Suspect obstructive sleep apnea/OHS as a cause of intermittent desaturations, patient would benefit from outpatient  BiPAP at home. Apparently was intolerant to BiPAP but willing to reconsider with pulmonologist help, . St[place]call , following clinically, weaning off oxygen to room air, hopefully discharge in the next 1-2 days 2. COPD exacerbation. Continue steroids, nebulizers and inhalation therapy. Stable clinically, weaning off oxygen, very likely needs BiPAP intermittently at daytime as well as nighttime 3. Acute bronchitis, continue Zithromax and  attempted to obtain sputum cultures , but not acceptable for testing 4.  bilateral lower extremity edema, negative lower extremity  ultrasound for DVT, continue diuretic. Improved clinically 5. Acute diastolic congestive heart failure with pulmonary edema, continue diuretics. Follow ins and outs as well as weight and kidney function,  echocardiogram was observed 6. Obesity with intermittent desaturations concerning for obstructive sleep apnea/OHS, would benefit from outpatient BIPAP, pulmonologist  7. Essential hypertension , advance Losartan   Management plans discussed with the patient, family and they are in agreement.   DRUG ALLERGIES:  Allergies  Allergen Reactions  . Augmentin [Amoxicillin-Pot Clavulanate] Itching    CODE STATUS:     Code Status Orders        Start     Ordered   09/25/15 1206  Full code   Continuous     09/25/15 1205      TOTAL TIME TAKING CARE OF THIS PATIENT: 35 minutes.    Theodoro Grist M.D on 09/30/2015 at 11:54 AM  Between 7am to 6pm - Pager - [phone]  After 6pm go to [email or web address] - password [email or web address] [facility]  Office  [phone]  CC: Primary care physician; Dicky Doe, MD

## 2015-10-01 DIAGNOSIS — Z6841 Body Mass Index (BMI) 40.0 and over, adult: Secondary | ICD-10-CM

## 2015-10-01 DIAGNOSIS — E1159 Type 2 diabetes mellitus with other circulatory complications: Secondary | ICD-10-CM

## 2015-10-01 DIAGNOSIS — E662 Morbid (severe) obesity with alveolar hypoventilation: Secondary | ICD-10-CM

## 2015-10-01 DIAGNOSIS — R531 Weakness: Secondary | ICD-10-CM

## 2015-10-01 DIAGNOSIS — I5031 Acute diastolic (congestive) heart failure: Secondary | ICD-10-CM

## 2015-10-01 DIAGNOSIS — J209 Acute bronchitis, unspecified: Secondary | ICD-10-CM

## 2015-10-01 DIAGNOSIS — R911 Solitary pulmonary nodule: Secondary | ICD-10-CM

## 2015-10-01 DIAGNOSIS — L309 Dermatitis, unspecified: Secondary | ICD-10-CM

## 2015-10-01 DIAGNOSIS — I10 Essential (primary) hypertension: Secondary | ICD-10-CM

## 2015-10-01 DIAGNOSIS — J441 Chronic obstructive pulmonary disease with (acute) exacerbation: Secondary | ICD-10-CM

## 2015-10-01 LAB — GLUCOSE, CAPILLARY: Glucose-Capillary: 157 mg/dL — ABNORMAL HIGH (ref 65–99)

## 2015-10-01 MED ORDER — FUROSEMIDE 10 MG/ML IJ SOLN
40.0000 mg | Freq: Two times a day (BID) | INTRAMUSCULAR | Status: DC
  Administered 2015-10-01: 40 mg via INTRAVENOUS
  Filled 2015-10-01: qty 4

## 2015-10-01 MED ORDER — MOMETASONE FURO-FORMOTEROL FUM 100-5 MCG/ACT IN AERO: 2.0000 | INHALATION_SPRAY | Freq: Two times a day (BID) | RESPIRATORY_TRACT | Status: DC

## 2015-10-01 MED ORDER — NYSTATIN 100000 UNIT/ML MT SUSP: 5.0000 mL | Freq: Four times a day (QID) | OROMUCOSAL | Status: DC

## 2015-10-01 MED ORDER — PREDNISONE 10 MG (21) PO TBPK: 10.0000 mg | ORAL_TABLET | Freq: Every day | ORAL | Status: DC

## 2015-10-01 MED ORDER — LOSARTAN POTASSIUM 25 MG PO TABS: 25.0000 mg | ORAL_TABLET | Freq: Two times a day (BID) | ORAL | Status: DC

## 2015-10-01 MED ORDER — LEVOFLOXACIN 750 MG PO TABS: 750.0000 mg | ORAL_TABLET | Freq: Every day | ORAL | Status: DC

## 2015-10-01 MED ORDER — TIOTROPIUM BROMIDE MONOHYDRATE 18 MCG IN CAPS: 18.0000 ug | ORAL_CAPSULE | Freq: Every day | RESPIRATORY_TRACT | Status: DC

## 2015-10-01 NOTE — Discharge Summary (Signed)
[facility] [place] NAME: Martha Woodard    MR#:  [id number]  DATE OF BIRTH:  11-03-1965  DATE OF ADMISSION:  09/25/2015 ADMITTING PHYSICIAN: Loletha Grayer, MD  DATE OF DISCHARGE: No discharge date for patient encounter.  PRIMARY CARE PHYSICIAN: Dicky Doe, MD     ADMISSION DIAGNOSIS:  Acute respiratory failure with hypoxia and hypercarbia (HCC) [J96.01, J96.02]  DISCHARGE DIAGNOSIS:  Active Problems:   Acute respiratory failure with hypoxia and hypercapnia (HCC)   COPD exacerbation (HCC)   Acute bronchitis   Obesity   Obesity hypoventilation syndrome (HCC)   Lung nodule   Acute diastolic CHF (congestive heart failure) (HCC)   Bilateral lower extremity edema   Eczema   Essential hypertension   Generalized weakness   SECONDARY DIAGNOSIS:   Past Medical History  Diagnosis Date  . Sinus problem   . Diabetes mellitus without complication ([place])   . Hypertension   . Asthma   . COPD (chronic obstructive pulmonary disease) ([place])   . Multiple lung nodules on CT     .pro HOSPITAL COURSE:   The patient is 50 year old Caucasian female with past medical history significant for history of diabetes, asthma, COPD, lung nodules who presents to the hospital with complaints of severe shortness of breath, cough with yellow phlegm, wheezing. She was profoundly hypoxic with O2 sats in the 70s and was initiated on BiPAP mask. She also admitted of gaining approximately 20 pounds in the past months. Patient's admission labs revealed hypercapnia pCO2 level of 79, hypoxia, hyperglycemia. Chest x-ray revealed cardiomegaly but no edema or consolidation. Because of lower extremity edema. Patient underwent a Doppler ultrasound of lower extremities which showed no DVT. CTA of the chest showed no pulmonary embolus. Bilateral lower lobe atelectasis, pulmonary edema versus interstitial pneumonitis superimposed on changes of COPD and right lung  nodules which were unchanged. PET scan was recommended. Patient was admitted to the hospital for further evaluation and treatment. She was initiated on steroids, antibiotics, inhalation therapy as well as diuretics. With this conservative therapy. Her condition improved, although she remained hypoxic intermittently, especially at nighttime and sitting in the bed, concerning for obesity hypoventilation syndrome. Patient was seen by pulmonologist who recommended to continue therapy for infection, COPD exacerbation and follow-up with him as outpatient. He also felt that patient may need to be placed on BiPAP at home.  Discussion by problem 1. acute respiratory failure with hypoxia and hypercapnia, due to COPD exacerbation, acute diastolic CHF, obstructive sleep apnea/obesity hypoventilation syndrome , but no PE. Patient improved, however, requires oxygen at 3 L of oxygen on exertion . Clinically, she is seems to be stable to be discharged home on tapering doses of steroids, antibiotics,  inhalation therapy with nebulizers, she is also advised to continue Lasix . Patient is to follow-up with pulmonologist as well as primary care physician as outpatient . She will be reevaluated as outpatient by home health nursing staff, RN and PT . Doppler ultrasound of lower extremities showed no DVT. Patient was not on oxygen at home, she is at 3 L now. Suspect obstructive sleep apnea/OHS as a cause of intermittent desaturations, patient would benefit from outpatient BiPAP at home. Apparently was intolerant to BiPAP but willing to reconsider with pulmonologist help 2. COPD exacerbation. Continue to taper steroids, nebulizers and inhalation therapy. Stable clinically, continue  oxygen now for chronic COPD, may need BiPAP at home, follow-up with pulmonologist as outpatient 3. Acute bronchitis, continue levofloxacin and  attempted to obtain sputum cultures , but not acceptable for testing 4. bilateral lower extremity edema,  negative lower extremity ultrasound for DVT, continue diuretic. Improved clinically 5. Acute diastolic congestive heart failure with pulmonary edema, continue diuretics.  echocardiogram was observed, mother ejection fraction, grade 1 diastolic dysfunction.  6. Obesity with intermittent desaturations concerning for obstructive sleep apnea/OHS, would benefit from outpatient BIPAP, pulmonologist follow-up was recommended, as well as weight loss 7. Essential hypertension , advanced Losartan , better blood pressure control 8. Dysphagia. Patient underwent barium swallowing study which was essentially normal.    DISCHARGE CONDITIONS:   Stable  CONSULTS OBTAINED:  Treatment Team:  Loletha Grayer, MD  DRUG ALLERGIES:   Allergies  Allergen Reactions  . Augmentin [Amoxicillin-Pot Clavulanate] Itching    DISCHARGE MEDICATIONS:   Current Discharge Medication List    START taking these medications   Details  levofloxacin (LEVAQUIN) 750 MG tablet Take 1 tablet (750 mg total) by mouth daily. Qty: 5 tablet, Refills: 0    mometasone-formoterol (DULERA) 100-5 MCG/ACT AERO Inhale 2 puffs into the lungs 2 (two) times daily. Qty: 1 Inhaler, Refills: 5    nystatin (MYCOSTATIN) 100000 UNIT/ML suspension Use as directed 5 mLs (500,000 Units total) in the mouth or throat 4 (four) times daily. Qty: 60 mL, Refills: 0    predniSONE (STERAPRED UNI-PAK 21 TAB) 10 MG (21) TBPK tablet Take 1 tablet (10 mg total) by mouth daily. Please take 6 pills once a today on the day one,  then taper by one pill daily until finished, thank you Qty: 21 tablet, Refills: 0    tiotropium (SPIRIVA) 18 MCG inhalation capsule Place 1 capsule (18 mcg total) into inhaler and inhale daily. Qty: 30 capsule, Refills: 12      CONTINUE these medications which have CHANGED   Details  losartan (COZAAR) 25 MG tablet Take 1 tablet (25 mg total) by mouth 2 (two) times daily. Qty: 60 tablet, Refills: 5      CONTINUE these  medications which have NOT CHANGED   Details  albuterol (PROVENTIL) (2.5 MG/3ML) 0.083% nebulizer solution Take 3 mLs (2.5 mg total) by nebulization every 6 (six) hours as needed for wheezing or shortness of breath. Qty: 75 mL, Refills: 12    atorvastatin (LIPITOR) 10 MG tablet Take 1 tablet (10 mg total) by mouth daily. Qty: 30 tablet, Refills: 12    DiphenhydrAMINE HCl (ALLERGY MED PO) Take 1-2 capsules by mouth every 4 (four) hours as needed.    fluticasone (FLONASE) 50 MCG/ACT nasal spray Place 1 spray into both nostrils daily.     hydrochlorothiazide (HYDRODIURIL) 12.5 MG tablet Take 1 tablet (12.5 mg total) by mouth daily. Qty: 30 tablet, Refills: 6   Associated Diagnoses: Pedal edema    ibuprofen (ADVIL,MOTRIN) 200 MG tablet Take 200 mg by mouth every 6 (six) hours as needed.    metFORMIN (GLUCOPHAGE) 500 MG tablet Take 1 tablet (500 mg total) by mouth 2 (two) times daily with a meal. 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 Qty: 30 tablet, Refills: 12   Associated Diagnoses: PAC (premature atrial contraction)    Multiple Vitamin (MULTIVITAMIN) tablet Take 1 tablet by mouth daily.    VENTOLIN HFA 108 (90 BASE) MCG/ACT inhaler INHALE 2 PUFFS BY MOUTH  4 TIMES DAILY AS NEEDED. Qty: 18 g, Refills: 2      STOP taking these medications     doxycycline (VIBRA-TABS) 100 MG tablet          DISCHARGE INSTRUCTIONS:    Patient is to follow-up with primary care physician and pulmonologist as outpatient  If you experience worsening of your admission symptoms, develop shortness of breath, life threatening emergency, suicidal or homicidal thoughts you must seek medical attention immediately by calling 911 or calling your MD  immediately  if symptoms less severe.  You Must read complete instructions/literature along with all the possible adverse reactions/side effects for all the Medicines you take and that have been prescribed to you. Take any new Medicines after you have completely understood and accept all the possible adverse reactions/side effects.   Please note  You were cared for by a hospitalist during your hospital stay. If you have any questions about your discharge medications or the care you received while you were in the hospital after you are discharged, you can call the unit and asked to speak with the hospitalist on call if the hospitalist that took care of you is not available. Once you are discharged, your primary care physician will handle any further medical issues. Please note that NO REFILLS for any discharge medications will be authorized once you are discharged, as it is imperative that you return to your primary care physician (or establish a relationship with a primary care physician if you do not have one) for your aftercare needs so that they can reassess your need for medications and monitor your lab values.    Today   CHIEF COMPLAINT:   Chief Complaint  Patient presents with  . Shortness of Breath    HISTORY OF PRESENT ILLNESS:  Martha Woodard  is a 50 y.o. female with a known history of diabetes, asthma, COPD, lung nodules who presents to the hospital with complaints of severe shortness of breath, cough with yellow phlegm, wheezing. She was profoundly hypoxic with O2 sats in the 70s and was initiated on BiPAP mask. She also admitted of gaining approximately 20 pounds in the past months. Patient's admission labs revealed hypercapnia pCO2 level of 79, hypoxia, hyperglycemia. Chest x-ray revealed cardiomegaly but no edema or consolidation. Because of lower extremity edema. Patient underwent a Doppler ultrasound of lower extremities which showed no DVT. CTA of the chest showed no pulmonary  embolus. Bilateral lower lobe atelectasis, pulmonary edema versus interstitial pneumonitis superimposed on changes of COPD and right lung nodules which were unchanged. PET scan was recommended. Patient was admitted to the hospital for further evaluation and treatment. She was initiated on steroids, antibiotics, inhalation therapy as well as diuretics. With this conservative therapy. Her condition improved, although she remained hypoxic intermittently, especially at nighttime and sitting in the bed, concerning for obesity hypoventilation syndrome. Patient was seen by pulmonologist who recommended to continue therapy for infection, COPD exacerbation and follow-up with him as outpatient. He also felt that patient may need to be placed on BiPAP at home.  Discussion by problem 1. acute respiratory failure with hypoxia and hypercapnia, due to COPD exacerbation, acute diastolic CHF, obstructive sleep apnea/obesity hypoventilation syndrome , but no PE. Patient improved, however, requires oxygen at 3 L of oxygen on exertion . Clinically, she is seems to be stable to be discharged home on tapering doses of steroids, antibiotics,  inhalation therapy with nebulizers, she is also advised to continue Lasix . Patient is to follow-up with pulmonologist  as well as primary care physician as outpatient . She will be reevaluated as outpatient by home health nursing staff, RN and PT . Doppler ultrasound of lower extremities showed no DVT. Patient was not on oxygen at home, she is at 3 L now. Suspect obstructive sleep apnea/OHS as a cause of intermittent desaturations, patient would benefit from outpatient BiPAP at home. Apparently was intolerant to BiPAP but willing to reconsider with pulmonologist help 2. COPD exacerbation. Continue to taper steroids, nebulizers and inhalation therapy. Stable clinically, continue  oxygen now for chronic COPD, may need BiPAP at home, follow-up with pulmonologist as outpatient 3. Acute bronchitis,  continue levofloxacin and attempted to obtain sputum cultures , but not acceptable for testing 4. bilateral lower extremity edema, negative lower extremity ultrasound for DVT, continue diuretic. Improved clinically 5. Acute diastolic congestive heart failure with pulmonary edema, continue diuretics.  echocardiogram was observed, mother ejection fraction, grade 1 diastolic dysfunction.  6. Obesity with intermittent desaturations concerning for obstructive sleep apnea/OHS, would benefit from outpatient BIPAP, pulmonologist follow-up was recommended, as well as weight loss 7. Essential hypertension , advanced Losartan , better blood pressure control 8. Dysphagia. Patient underwent barium swallowing study which was essentially normal.     VITAL SIGNS:  Blood pressure 123/53, pulse 86, temperature 99.6 F (37.6 C), temperature source Oral, resp. rate 20, height 5\' 4"  (1.626 m), weight 132.768 kg (292 lb 11.2 oz), SpO2 94 %.  I/O:    Intake/Output Summary (Last 24 hours) at 10/01/15 1255 Last data filed at 10/01/15 0800  Gross per 24 hour  Intake   1080 ml  Output      0 ml  Net   1080 ml    PHYSICAL EXAMINATION:  GENERAL:  50 y.o.-year-old patient lying in the bed with no acute distress.  EYES: Pupils equal, round, reactive to light and accommodation. No scleral icterus. Extraocular muscles intact.  HEENT: Head atraumatic, normocephalic. Oropharynx and nasopharynx clear.  NECK:  Supple, no jugular venous distention. No thyroid enlargement, no tenderness.  LUNGS: Normal breath sounds bilaterally, no wheezing, rales,rhonchi or crepitation. No use of accessory muscles of respiration.  CARDIOVASCULAR: S1, S2 normal. No murmurs, rubs, or gallops.  ABDOMEN: Soft, non-tender, non-distended. Bowel sounds present. No organomegaly or mass.  EXTREMITIES: No pedal edema, cyanosis, or clubbing.  NEUROLOGIC: Cranial nerves II through XII are intact. Muscle strength 5/5 in all extremities.  Sensation intact. Gait not checked.  PSYCHIATRIC: The patient is alert and oriented x 3.  SKIN: No obvious rash, lesion, or ulcer.   DATA REVIEW:   CBC  Recent Labs Lab 09/30/15 0439  WBC 7.5  HGB 14.6  HCT 47.5*  PLT 225    Chemistries   Recent Labs Lab 09/30/15 0439  NA 139  K 4.0  CL 86*  CO2 45*  GLUCOSE 108*  BUN 21*  CREATININE 0.60  CALCIUM 9.1    Cardiac Enzymes  Recent Labs Lab 09/25/15 1904  TROPONINI 0.03    Microbiology Results  Results for orders placed or performed during the hospital encounter of 09/25/15  Blood culture (routine x 2)     Status: None   Collection Time: 09/25/15  9:50 AM  Result Value Ref Range Status   Specimen Description BLOOD LEFT ARM  Final   Special Requests BOTTLES DRAWN AEROBIC AND ANAEROBIC [place]  Final   Culture NO GROWTH 5 DAYS  Final   Report Status 09/30/2015 FINAL  Final  Blood culture (routine x 2)  Status: None   Collection Time: 09/25/15  9:55 AM  Result Value Ref Range Status   Specimen Description BLOOD RIGHT ARM  Final   Special Requests BOTTLES DRAWN AEROBIC AND ANAEROBIC [place]  Final   Culture NO GROWTH 5 DAYS  Final   Report Status 09/30/2015 FINAL  Final  MRSA PCR Screening     Status: None   Collection Time: 09/25/15  2:03 PM  Result Value Ref Range Status   MRSA by PCR NEGATIVE NEGATIVE Final    Comment:        The GeneXpert MRSA Assay (FDA approved for NASAL specimens only), is one component of a comprehensive MRSA colonization surveillance program. It is not intended to diagnose MRSA infection nor to guide or monitor treatment for MRSA infections.   Culture, expectorated sputum-assessment     Status: None   Collection Time: 09/25/15  3:26 PM  Result Value Ref Range Status   Specimen Description SPUTUM  Final   Special Requests NONE  Final   Sputum evaluation   Final    Sputum specimen not acceptable for testing.  Please recollect.   Results Called to: RN [facility] MOORE 09/25/15 1548  MLM    Report Status 09/25/2015 FINAL  Final  Culture, expectorated sputum-assessment     Status: None   Collection Time: 09/26/15  7:40 AM  Result Value Ref Range Status   Specimen Description EXPECTORATED SPUTUM  Final   Special Requests NONE  Final   Sputum evaluation   Final    Sputum specimen not acceptable for testing.  Please recollect.   Results Called to: [facility] VERDI AT [id number] ON 09/26/15 CTJ    Report Status 09/26/2015 FINAL  Final    RADIOLOGY:  Dg Chest 2 View  09/30/2015  CLINICAL DATA:  Severe shortness of Breath. EXAM: CHEST  2 VIEW COMPARISON:  09/25/2015. FINDINGS: Bibasilar atelectasis noted. The lungs are clear wiithout focal pneumonia, edema, pneumothorax or pleural effusion. Cardiopericardial silhouette is at upper limits of normal for size. The visualized bony structures of the thorax are intact. Telemetry leads overlie the chest. IMPRESSION: Borderline cardiomegaly with basilar atelectasis. Electronically Signed   By: Misty Stanley M.D.   On: 09/30/2015 14:24    EKG:   Orders placed or performed during the hospital encounter of 09/25/15  . EKG 12-Lead  . EKG 12-Lead  . ED EKG  . ED EKG      Management plans discussed with the patient, family and they are in agreement.  CODE STATUS:     Code Status Orders        Start     Ordered   09/25/15 1206  Full code   Continuous     09/25/15 1205      TOTAL TIME TAKING CARE OF THIS PATIENT: 40 minutes.    Theodoro Grist M.D on 10/01/2015 at 12:55 PM  Between 7am to 6pm - Pager - [phone]  After 6pm go to [email or web address] - password [email or web address] [facility]  Office  [phone]  CC: Primary care physician; Dicky Doe, MD

## 2015-10-01 NOTE — Care Management Note (Addendum)
Case Management Note  Patient Details  Name: Martha Woodard MRN: [id number] Date of Birth: 18-Jun-1966  Subjective/Objective:    Discussed discharge planning with Ms Deer who chose [facility] as her home oxygen and home health provider. A referral was called to [facility] requesting RN. PT, Aide. Will Anderson at [facility] will deliver a portable tank and set up new home oxygen, and a home nebulizer machine.                Action/Plan:   Expected Discharge Date:                  Expected Discharge Plan:     In-House Referral:     Discharge planning Services     Post Acute Care Choice:    Choice offered to:     DME Arranged:    DME Agency:     HH Arranged:    [place] Agency:     Status of Service:     Medicare Important Message Given:    Date Medicare IM Given:    Medicare IM give by:    Date Additional Medicare IM Given:    Additional Medicare Important Message give by:     If discussed at [place] of Stay Meetings, dates discussed:    Additional Comments:  Jasier Calabretta A, RN 10/01/2015, 10:34 AM

## 2015-10-01 NOTE — Progress Notes (Signed)
SATURATION QUALIFICATIONS: (This note is used to comply with regulatory documentation for home oxygen)  Patient Saturations on Room Air at Rest = 92%  Patient Saturations on Room Air while Ambulating =82 %  Patient Saturations on 3 Liters of oxygen while Ambulating =93 %  Please briefly explain why patient needs home oxygen: Desaturation with activity.

## 2015-10-01 NOTE — Progress Notes (Signed)
Discharge Note:  Pt given discharge instructions and prescriptions.  Pt verbalized understanding. Pt wheeled to car by staff.

## 2015-10-02 ENCOUNTER — Telehealth: Payer: Self-pay | Admitting: *Deleted

## 2015-10-02 DIAGNOSIS — R0902 Hypoxemia: Secondary | ICD-10-CM

## 2015-10-02 NOTE — Telephone Encounter (Signed)
-----   Message from Laverle Hobby, MD sent at 09/28/2015  2:47 PM EST ----- Regarding: hospital f/u Please have patient follow up with me in 3 to 4 weeks.

## 2015-10-02 NOTE — Telephone Encounter (Signed)
appt schedules. Pt informed to have CXR prior to appt. Nothing further needed.

## 2015-10-02 NOTE — Telephone Encounter (Signed)
LMOVM for pt to return call to schedule f/u appt with DR.

## 2015-10-09 ENCOUNTER — Ambulatory Visit: Payer: No Typology Code available for payment source | Admitting: Family Medicine

## 2015-10-11 ENCOUNTER — Encounter: Payer: Self-pay | Admitting: Family Medicine

## 2015-10-11 ENCOUNTER — Ambulatory Visit (INDEPENDENT_AMBULATORY_CARE_PROVIDER_SITE_OTHER): Payer: BLUE CROSS/BLUE SHIELD | Admitting: Family Medicine

## 2015-10-11 VITALS — BP 123/74 | HR 82 | Temp 98.6°F | Resp 16 | Ht 64.0 in | Wt 283.0 lb

## 2015-10-11 DIAGNOSIS — J9602 Acute respiratory failure with hypercapnia: Secondary | ICD-10-CM | POA: Diagnosis not present

## 2015-10-11 DIAGNOSIS — E119 Type 2 diabetes mellitus without complications: Secondary | ICD-10-CM | POA: Diagnosis not present

## 2015-10-11 DIAGNOSIS — I10 Essential (primary) hypertension: Secondary | ICD-10-CM

## 2015-10-11 DIAGNOSIS — J441 Chronic obstructive pulmonary disease with (acute) exacerbation: Secondary | ICD-10-CM | POA: Diagnosis not present

## 2015-10-11 DIAGNOSIS — I5031 Acute diastolic (congestive) heart failure: Secondary | ICD-10-CM

## 2015-10-11 DIAGNOSIS — J9601 Acute respiratory failure with hypoxia: Secondary | ICD-10-CM

## 2015-10-11 LAB — POCT GLYCOSYLATED HEMOGLOBIN (HGB A1C): Hemoglobin A1C: 7.2

## 2015-10-11 NOTE — Progress Notes (Signed)
Name: Martha Woodard   MRN: [id number]    DOB: 1966/02/06   Date:10/11/2015       Progress Note  Subjective  Chief Complaint  Chief Complaint  Patient presents with  . Hospitalization Follow-up    ER to ICU was discharded on 01/02 CHF lowest BS 101 and highest 114 and avarage 109    HPI Here for f/u of DM and HBP.  Was hospitalized 09/24/15-10/01/15 with resp failure and CHF.  Now on O2.  Has f/u with Pulmonary on 10/23/15.  Feeling better.  Breathing well on O2.  BSs running 100-120 in AM at home.  She has lost 15# of weight over past 3 months.  No problem-specific assessment & plan notes found for this encounter.   Past Medical History  Diagnosis Date  . Sinus problem   . Diabetes mellitus without complication ([place])   . Hypertension   . Asthma   . COPD (chronic obstructive pulmonary disease) ([place])   . Multiple lung nodules on CT     Past Surgical History  Procedure Laterality Date  . Wisdom tooth extraction    . Biopsy thyroid Right 02-06-14    NEGATIVE FOR MALIGNANT CELLS proteinaceous material and macrophages.  . Thryoid fna Right April 2015    Proteinaceous material and macrophages.    Family History  Problem Relation Age of Onset  . Cancer Mother 23    uterian and ovarian   . Goiter Mother   . Stroke Father   . COPD Sister   . Stroke Brother   . Heart murmur Sister   .       Social History   Social History  . Marital Status: Single    Spouse Name: N/A  . Number of Children: N/A  . Years of Education: N/A   Occupational History  . Not on file.   Social History Main Topics  . Smoking status: Former Smoker -- 74 years    Quit date: 07/10/2013  . Smokeless tobacco: Not on file  . Alcohol Use: No  . Drug Use: No  . Sexual Activity: Not on file   Other Topics Concern  . Not on file   Social History Narrative     Current outpatient prescriptions:  .  albuterol (PROVENTIL) (2.5 MG/3ML) 0.083% nebulizer solution, Take 3 mLs (2.5 mg total) by  nebulization every 6 (six) hours as needed for wheezing or shortness of breath., Disp: 75 mL, Rfl: 12 .  atorvastatin (LIPITOR) 10 MG tablet, Take 1 tablet (10 mg total) by mouth daily., Disp: 30 tablet, Rfl: 12 .  DiphenhydrAMINE HCl (ALLERGY MED PO), Take 1-2 capsules by mouth every 4 (four) hours as needed., Disp: , Rfl:  .  fluticasone (FLONASE) 50 MCG/ACT nasal spray, Place 1 spray into both nostrils daily. , Disp: , Rfl:  .  hydrochlorothiazide (HYDRODIURIL) 12.5 MG tablet, Take 1 tablet (12.5 mg total) by mouth daily., Disp: 30 tablet, Rfl: 6 .  ibuprofen (ADVIL,MOTRIN) 200 MG tablet, Take 200 mg by mouth every 6 (six) hours as needed., Disp: , Rfl:  .  losartan (COZAAR) 25 MG tablet, Take 1 tablet (25 mg total) by mouth 2 (two) times daily., Disp: 60 tablet, Rfl: 5 .  metFORMIN (GLUCOPHAGE) 500 MG tablet, Take 1 tablet (500 mg total) by mouth 2 (two) times daily with a meal., Disp: 60 tablet, Rfl: 12 .  metoprolol succinate (TOPROL-XL) 25 MG 24 hr tablet, Take 1 tablet (25 mg total) by mouth daily., Disp: 30 tablet,  Rfl: 12 .  mometasone-formoterol (DULERA) 100-5 MCG/ACT AERO, Inhale 2 puffs into the lungs 2 (two) times daily., Disp: 1 Inhaler, Rfl: 5 .  Multiple Vitamin (MULTIVITAMIN) tablet, Take 1 tablet by mouth daily., Disp: , Rfl:  .  nystatin (MYCOSTATIN) 100000 UNIT/ML suspension, Use as directed 5 mLs (500,000 Units total) in the mouth or throat 4 (four) times daily., Disp: 60 mL, Rfl: 0 .  tiotropium (SPIRIVA) 18 MCG inhalation capsule, Place 1 capsule (18 mcg total) into inhaler and inhale daily., Disp: 30 capsule, Rfl: 12 .  VENTOLIN HFA 108 (90 BASE) MCG/ACT inhaler, INHALE 2 PUFFS BY MOUTH 4 TIMES DAILY AS NEEDED., Disp: 18 g, Rfl: 2  Allergies  Allergen Reactions  . Augmentin [Amoxicillin-Pot Clavulanate] Itching     Review of Systems  Constitutional: Positive for weight loss (desired). Negative for fever, chills and malaise/fatigue.  HENT: Negative for hearing loss.    Eyes: Negative for blurred vision and double vision.  Respiratory: Negative for cough, shortness of breath and wheezing.   Cardiovascular: Negative for chest pain, palpitations and leg swelling.  Gastrointestinal: Negative for heartburn, abdominal pain and blood in stool.  Genitourinary: Negative for dysuria, urgency and frequency.  Musculoskeletal: Negative for myalgias.  Skin: Negative for rash.  Neurological: Negative for dizziness, tremors, weakness and headaches.      Objective  Filed Vitals:   10/11/15 0847  BP: 123/74  Pulse: 82  Temp: 98.6 F (37 C)  TempSrc: Oral  Resp: 16  Height: _0  (1.626 m)  Weight: 283 lb (128.368 kg)  SpO2: 94%    Physical Exam  Constitutional: She is oriented to person, place, and time and well-developed, well-nourished, and in no distress. No distress.  HENT:  Head: Normocephalic and atraumatic.  Eyes: Conjunctivae and EOM are normal. Pupils are equal, round, and reactive to light. No scleral icterus.  Neck: Normal range of motion. Neck supple. Carotid bruit is not present. No thyromegaly present.  Cardiovascular: Normal rate, regular rhythm and normal heart sounds.  Exam reveals no gallop and no friction rub.   No murmur heard. Pulmonary/Chest: Effort normal and breath sounds normal. No respiratory distress. She has no wheezes. She has no rales.  Abdominal: Soft. Bowel sounds are normal. She exhibits no distension and no mass. There is no tenderness.  Musculoskeletal: She exhibits edema (Trace bilateral pedal edema.).  Lymphadenopathy:    She has no cervical adenopathy.  Neurological: She is alert and oriented to person, place, and time.  Vitals reviewed.      Recent Results (from the past 2160 hour(s))  CBC with Differential     Status: Abnormal   Collection Time: 09/25/15  9:49 AM  Result Value Ref Range   WBC 7.2 3.6 - 11.0 K/uL   RBC 5.67 (H) 3.80 - 5.20 MIL/uL   Hemoglobin 13.8 12.0 - 16.0 g/dL    Comment: RESULT  REPEATED AND VERIFIED   HCT 44.5 35.0 - 47.0 %   MCV 78.4 (L) 80.0 - 100.0 fL   MCH 24.3 (L) 26.0 - 34.0 pg   MCHC 31.0 (L) 32.0 - 36.0 g/dL   RDW 18.3 (H) 11.5 - 14.5 %   Platelets 262 150 - 440 K/uL   Neutrophils Relative % 75% %   Neutro Abs 5.5 1.4 - 6.5 K/uL   Lymphocytes Relative 14% %   Lymphs Abs 1.0 1.0 - 3.6 K/uL   Monocytes Relative 9% %   Monocytes Absolute 0.6 0.2 - 0.9 K/uL   Eosinophils Relative  1% %   Eosinophils Absolute 0.1 0 - 0.7 K/uL   Basophils Relative 1% %   Basophils Absolute 0.0 0 - 0.1 K/uL  Basic metabolic panel     Status: Abnormal   Collection Time: 09/25/15  9:49 AM  Result Value Ref Range   Sodium 135 135 - 145 mmol/L   Potassium 3.5 3.5 - 5.1 mmol/L   Chloride 88 (L) 101 - 111 mmol/L   CO2 39 (H) 22 - 32 mmol/L   Glucose, Bld 122 (H) 65 - 99 mg/dL   BUN 11 6 - 20 mg/dL   Creatinine, Ser 0.50 0.44 - 1.00 mg/dL   Calcium 8.7 (L) 8.9 - 10.3 mg/dL   GFR calc non Af Amer >60 >60 mL/min   GFR calc Af Amer >60 >60 mL/min    Comment: (NOTE) The eGFR has been calculated using the CKD EPI equation. This calculation has not been validated in all clinical situations. eGFR's persistently <60 mL/min signify possible Chronic Kidney Disease.    Anion gap 8 5 - 15  Brain natriuretic peptide     Status: Abnormal   Collection Time: 09/25/15  9:49 AM  Result Value Ref Range   B Natriuretic Peptide 251.0 (H) 0.0 - 100.0 pg/mL  Troponin I     Status: Abnormal   Collection Time: 09/25/15  9:49 AM  Result Value Ref Range   Troponin I 0.06 (H) <0.031 ng/mL    Comment: READ BACK AND VERIFIED WITH COLLYN GILLISPIE AT 1103 09/25/15 DAS        PERSISTENTLY INCREASED TROPONIN VALUES IN THE RANGE OF 0.04-0.49 ng/mL CAN BE SEEN IN:       -UNSTABLE ANGINA       -CONGESTIVE HEART FAILURE       -MYOCARDITIS       -CHEST TRAUMA       -ARRYHTHMIAS       -LATE PRESENTING MYOCARDIAL INFARCTION       -COPD   CLINICAL FOLLOW-UP RECOMMENDED.   Blood culture (routine  x 2)     Status: None   Collection Time: 09/25/15  9:50 AM  Result Value Ref Range   Specimen Description BLOOD LEFT ARM    Special Requests BOTTLES DRAWN AEROBIC AND ANAEROBIC 6CC    Culture NO GROWTH 5 DAYS    Report Status 09/30/2015 FINAL   Blood culture (routine x 2)     Status: None   Collection Time: 09/25/15  9:55 AM  Result Value Ref Range   Specimen Description BLOOD RIGHT ARM    Special Requests BOTTLES DRAWN AEROBIC AND ANAEROBIC [place]    Culture NO GROWTH 5 DAYS    Report Status 09/30/2015 FINAL   Blood gas, venous     Status: Abnormal   Collection Time: 09/25/15 10:00 AM  Result Value Ref Range   pH, Ven 7.35 7.320 - 7.430   pCO2, Ven 84 (HH) 44.0 - 60.0 mmHg    Comment: CRITICAL RESULT CALLED TO, READ BACK BY AND VERIFIED WITH: CALLED TO GILLISPIE RN [id number]    Sample type VENOUS   MRSA PCR Screening     Status: None   Collection Time: 09/25/15  2:03 PM  Result Value Ref Range   MRSA by PCR NEGATIVE NEGATIVE    Comment:        The GeneXpert MRSA Assay (FDA approved for NASAL specimens only), is one component of a comprehensive MRSA colonization surveillance program. It is not intended to diagnose MRSA infection nor to  guide or monitor treatment for MRSA infections.   Troponin I     Status: Abnormal   Collection Time: 09/25/15  3:09 PM  Result Value Ref Range   Troponin I 0.09 (H) <0.031 ng/mL    Comment: PREVIOUS RESULT CALLED AT 1103 09/25/2015        PERSISTENTLY INCREASED TROPONIN VALUES IN THE RANGE OF 0.04-0.49 ng/mL CAN BE SEEN IN:       -UNSTABLE ANGINA       -CONGESTIVE HEART FAILURE       -MYOCARDITIS       -CHEST TRAUMA       -ARRYHTHMIAS       -LATE PRESENTING MYOCARDIAL INFARCTION       -COPD   CLINICAL FOLLOW-UP RECOMMENDED.   Culture, expectorated sputum-assessment     Status: None   Collection Time: 09/25/15  3:26 PM  Result Value Ref Range   Specimen Description SPUTUM    Special Requests NONE    Sputum evaluation       Sputum specimen not acceptable for testing.  Please recollect.   Results Called to: RN [facility] MOORE 09/25/15 1548 MLM    Report Status 09/25/2015 FINAL   Blood gas, arterial     Status: Abnormal   Collection Time: 09/25/15  3:35 PM  Result Value Ref Range   FIO2 0.40    Delivery systems NASAL CANNULA    pH, Arterial 7.37 7.350 - 7.450   pCO2 arterial 79 (HH) 32.0 - 48.0 mmHg    Comment: CRITICAL RESULT, NOTIFIED PHYSICIAN DR. Alva Garnet [phone]    pO2, Arterial 61 (L) 83.0 - 108.0 mmHg   Bicarbonate 45.7 (H) 21.0 - 28.0 mEq/L   Acid-Base Excess 15.6 (H) 0.0 - 3.0 mmol/L   O2 Saturation 90.2 %   Patient temperature 37.0    Collection site RIGHT RADIAL    Sample type ARTERIAL DRAW    Allens test (pass/fail) POSITIVE (A) PASS  Glucose, capillary     Status: Abnormal   Collection Time: 09/25/15  4:55 PM  Result Value Ref Range   Glucose-Capillary 239 (H) 65 - 99 mg/dL  Troponin I     Status: None   Collection Time: 09/25/15  7:04 PM  Result Value Ref Range   Troponin I 0.03 <0.031 ng/mL    Comment:        NO INDICATION OF MYOCARDIAL INJURY.   Glucose, capillary     Status: Abnormal   Collection Time: 09/25/15  9:21 PM  Result Value Ref Range   Glucose-Capillary 156 (H) 65 - 99 mg/dL  CBC     Status: Abnormal   Collection Time: 09/26/15  4:10 AM  Result Value Ref Range   WBC 8.0 3.6 - 11.0 K/uL   RBC 5.59 (H) 3.80 - 5.20 MIL/uL   Hemoglobin 14.0 12.0 - 16.0 g/dL   HCT 44.9 35.0 - 47.0 %   MCV 80.3 80.0 - 100.0 fL   MCH 25.0 (L) 26.0 - 34.0 pg   MCHC 31.2 (L) 32.0 - 36.0 g/dL   RDW 18.0 (H) 11.5 - 14.5 %   Platelets 265 150 - 440 K/uL  Basic metabolic panel     Status: Abnormal   Collection Time: 09/26/15  4:10 AM  Result Value Ref Range   Sodium 138 135 - 145 mmol/L   Potassium 3.7 3.5 - 5.1 mmol/L   Chloride 88 (L) 101 - 111 mmol/L   CO2 41 (H) 22 - 32 mmol/L   Glucose, Bld 214 (  H) 65 - 99 mg/dL   BUN 14 6 - 20 mg/dL   Creatinine, Ser 0.54 0.44 - 1.00 mg/dL    Calcium 8.9 8.9 - 10.3 mg/dL   GFR calc non Af Amer >60 >60 mL/min   GFR calc Af Amer >60 >60 mL/min    Comment: (NOTE) The eGFR has been calculated using the CKD EPI equation. This calculation has not been validated in all clinical situations. eGFR's persistently <60 mL/min signify possible Chronic Kidney Disease.    Anion gap 9 5 - 15  Glucose, capillary     Status: Abnormal   Collection Time: 09/26/15  7:33 AM  Result Value Ref Range   Glucose-Capillary 195 (H) 65 - 99 mg/dL  Culture, expectorated sputum-assessment     Status: None   Collection Time: 09/26/15  7:40 AM  Result Value Ref Range   Specimen Description EXPECTORATED SPUTUM    Special Requests NONE    Sputum evaluation      Sputum specimen not acceptable for testing.  Please recollect.   Results Called to: RACHEL VERDI AT 7482 ON 09/26/15 CTJ    Report Status 09/26/2015 FINAL   Glucose, capillary     Status: Abnormal   Collection Time: 09/26/15 11:36 AM  Result Value Ref Range   Glucose-Capillary 194 (H) 65 - 99 mg/dL  Fibrin derivatives D-Dimer ([facility] only)     Status: None   Collection Time: 09/26/15  1:57 PM  Result Value Ref Range   Fibrin derivatives D-dimer ([facility]) 499 0 - 499    Comment: <> Exclusion of Venous Thromboembolism (VTE) - OUTPATIENTS ONLY        (Emergency Department or [place])             0-499 ng/ml (FEU)  : With a low to intermediate pretest                                        probability for VTE this test result                                        excludes the diagnosis of VTE.           > 499 ng/ml (FEU)  : VTE not excluded.  Additional work up                                   for VTE is required.   <>  Testing on Inpatients and Evaluation of Disseminated Intravascular        Coagulation ([facility])             Reference Range:   0-499 ng/ml (FEU)   Glucose, capillary     Status: Abnormal   Collection Time: 09/26/15  5:32 PM  Result Value Ref Range   Glucose-Capillary 141 (H) 65 -  99 mg/dL  Glucose, capillary     Status: Abnormal   Collection Time: 09/26/15  9:04 PM  Result Value Ref Range   Glucose-Capillary 122 (H) 65 - 99 mg/dL  Glucose, capillary     Status: Abnormal   Collection Time: 09/27/15  7:29 AM  Result Value Ref Range   Glucose-Capillary 137 (H) 65 - 99 mg/dL  Glucose, capillary  Status: Abnormal   Collection Time: 09/27/15 12:25 PM  Result Value Ref Range   Glucose-Capillary 223 (H) 65 - 99 mg/dL  Basic metabolic panel     Status: Abnormal   Collection Time: 09/27/15  1:55 PM  Result Value Ref Range   Sodium 139 135 - 145 mmol/L   Potassium 3.7 3.5 - 5.1 mmol/L   Chloride 84 (L) 101 - 111 mmol/L   CO2 45 (H) 22 - 32 mmol/L   Glucose, Bld 164 (H) 65 - 99 mg/dL   BUN 17 6 - 20 mg/dL   Creatinine, Ser 0.54 0.44 - 1.00 mg/dL   Calcium 9.0 8.9 - 10.3 mg/dL   GFR calc non Af Amer >60 >60 mL/min   GFR calc Af Amer >60 >60 mL/min    Comment: (NOTE) The eGFR has been calculated using the CKD EPI equation. This calculation has not been validated in all clinical situations. eGFR's persistently <60 mL/min signify possible Chronic Kidney Disease.    Anion gap 10 5 - 15  Glucose, capillary     Status: Abnormal   Collection Time: 09/27/15  4:34 PM  Result Value Ref Range   Glucose-Capillary 124 (H) 65 - 99 mg/dL  Glucose, capillary     Status: Abnormal   Collection Time: 09/27/15  8:52 PM  Result Value Ref Range   Glucose-Capillary 113 (H) 65 - 99 mg/dL  Creatinine, serum     Status: None   Collection Time: 09/28/15  6:07 AM  Result Value Ref Range   Creatinine, Ser 0.54 0.44 - 1.00 mg/dL   GFR calc non Af Amer >60 >60 mL/min   GFR calc Af Amer >60 >60 mL/min    Comment: (NOTE) The eGFR has been calculated using the CKD EPI equation. This calculation has not been validated in all clinical situations. eGFR's persistently <60 mL/min signify possible Chronic Kidney Disease.   Glucose, capillary     Status: Abnormal   Collection Time:  09/28/15  7:31 AM  Result Value Ref Range   Glucose-Capillary 140 (H) 65 - 99 mg/dL  Glucose, capillary     Status: Abnormal   Collection Time: 09/28/15 11:12 AM  Result Value Ref Range   Glucose-Capillary 261 (H) 65 - 99 mg/dL  Glucose, capillary     Status: Abnormal   Collection Time: 09/28/15  4:13 PM  Result Value Ref Range   Glucose-Capillary 106 (H) 65 - 99 mg/dL  Glucose, capillary     Status: Abnormal   Collection Time: 09/28/15  9:14 PM  Result Value Ref Range   Glucose-Capillary 139 (H) 65 - 99 mg/dL  Glucose, capillary     Status: Abnormal   Collection Time: 09/29/15  7:43 AM  Result Value Ref Range   Glucose-Capillary 107 (H) 65 - 99 mg/dL   Comment 1 Notify RN   Glucose, capillary     Status: Abnormal   Collection Time: 09/29/15 11:26 AM  Result Value Ref Range   Glucose-Capillary 120 (H) 65 - 99 mg/dL   Comment 1 Notify RN   Glucose, capillary     Status: Abnormal   Collection Time: 09/29/15  4:08 PM  Result Value Ref Range   Glucose-Capillary 127 (H) 65 - 99 mg/dL   Comment 1 Notify RN   Glucose, capillary     Status: Abnormal   Collection Time: 09/29/15  9:09 PM  Result Value Ref Range   Glucose-Capillary 158 (H) 65 - 99 mg/dL   Comment 1 Notify RN  CBC     Status: Abnormal   Collection Time: 09/30/15  4:39 AM  Result Value Ref Range   WBC 7.5 3.6 - 11.0 K/uL   RBC 5.96 (H) 3.80 - 5.20 MIL/uL   Hemoglobin 14.6 12.0 - 16.0 g/dL    Comment: RESULT REPEATED AND VERIFIED   HCT 47.5 (H) 35.0 - 47.0 %    Comment: RESULT REPEATED AND VERIFIED   MCV 79.7 (L) 80.0 - 100.0 fL   MCH 24.5 (L) 26.0 - 34.0 pg   MCHC 30.7 (L) 32.0 - 36.0 g/dL   RDW 18.1 (H) 11.5 - 14.5 %   Platelets 225 150 - 440 K/uL  Basic metabolic panel     Status: Abnormal   Collection Time: 09/30/15  4:39 AM  Result Value Ref Range   Sodium 139 135 - 145 mmol/L   Potassium 4.0 3.5 - 5.1 mmol/L   Chloride 86 (L) 101 - 111 mmol/L   CO2 45 (H) 22 - 32 mmol/L   Glucose, Bld 108 (H) 65 - 99  mg/dL   BUN 21 (H) 6 - 20 mg/dL   Creatinine, Ser 0.60 0.44 - 1.00 mg/dL   Calcium 9.1 8.9 - 10.3 mg/dL   GFR calc non Af Amer >60 >60 mL/min   GFR calc Af Amer >60 >60 mL/min    Comment: (NOTE) The eGFR has been calculated using the CKD EPI equation. This calculation has not been validated in all clinical situations. eGFR's persistently <60 mL/min signify possible Chronic Kidney Disease.    Anion gap 8 5 - 15  Glucose, capillary     Status: None   Collection Time: 09/30/15  7:42 AM  Result Value Ref Range   Glucose-Capillary 89 65 - 99 mg/dL   Comment 1 Notify RN   Urinalysis complete, with microscopic ([facility] only)     Status: Abnormal   Collection Time: 09/30/15 11:14 AM  Result Value Ref Range   Color, Urine STRAW (A) YELLOW   APPearance CLEAR (A) CLEAR   Glucose, UA NEGATIVE NEGATIVE mg/dL   Bilirubin Urine NEGATIVE NEGATIVE   Ketones, ur NEGATIVE NEGATIVE mg/dL   Specific Gravity, Urine 1.008 1.005 - 1.030   Hgb urine dipstick NEGATIVE NEGATIVE   pH 8.0 5.0 - 8.0   Protein, ur NEGATIVE NEGATIVE mg/dL   Nitrite NEGATIVE NEGATIVE   Leukocytes, UA NEGATIVE NEGATIVE   RBC / HPF NONE SEEN 0 - 5 RBC/hpf   WBC, UA 0-5 0 - 5 WBC/hpf   Bacteria, UA NONE SEEN NONE SEEN   Squamous Epithelial / LPF 0-5 (A) NONE SEEN  Glucose, capillary     Status: Abnormal   Collection Time: 09/30/15 11:22 AM  Result Value Ref Range   Glucose-Capillary 148 (H) 65 - 99 mg/dL   Comment 1 Notify RN   Glucose, capillary     Status: Abnormal   Collection Time: 09/30/15  3:52 PM  Result Value Ref Range   Glucose-Capillary 132 (H) 65 - 99 mg/dL   Comment 1 Notify RN   Glucose, capillary     Status: Abnormal   Collection Time: 09/30/15  8:55 PM  Result Value Ref Range   Glucose-Capillary 139 (H) 65 - 99 mg/dL   Comment 1 Notify RN   Glucose, capillary     Status: Abnormal   Collection Time: 10/01/15 11:08 AM  Result Value Ref Range   Glucose-Capillary 157 (H) 65 - 99 mg/dL   Comment 1 Notify RN    POCT HgB A1C  Status: Abnormal   Collection Time: 10/11/15  9:14 AM  Result Value Ref Range   Hemoglobin A1C 7.2%      Assessment & Plan  Problem List Items Addressed This Visit      Cardiovascular and Mediastinum   Hypertension - Primary   Acute diastolic CHF (congestive heart failure) (HCC)     Respiratory   Acute respiratory failure with hypoxia and hypercapnia (HCC)   COPD exacerbation (HCC)     Endocrine   Diabetes mellitus type 2, controlled, without complications ([place])   Relevant Orders   POCT HgB A1C (Completed)      No orders of the defined types were placed in this encounter.   1. Controlled type 2 diabetes mellitus without complication, without long-term current use of insulin (HCC) -conm. meds - POCT HgB A1C-7.2  2. Essential hypertension -cont. meds  3. Acute diastolic CHF (congestive heart failure) ([place])   4. COPD exacerbation ([place])   5. Acute respiratory failure with hypoxia and hypercapnia (HCC) -keep Pulmonary f/u appt.

## 2015-10-23 ENCOUNTER — Encounter: Payer: Self-pay | Admitting: Internal Medicine

## 2015-10-23 ENCOUNTER — Ambulatory Visit (INDEPENDENT_AMBULATORY_CARE_PROVIDER_SITE_OTHER): Payer: BLUE CROSS/BLUE SHIELD | Admitting: Internal Medicine

## 2015-10-23 ENCOUNTER — Ambulatory Visit
Admission: RE | Admit: 2015-10-23 | Discharge: 2015-10-23 | Disposition: A | Discharge status: Home / Self Care | Payer: BLUE CROSS/BLUE SHIELD | Source: Ambulatory Visit | Attending: Internal Medicine | Admitting: Internal Medicine

## 2015-10-23 VITALS — BP 126/64 | HR 81 | Ht 64.0 in | Wt 281.8 lb

## 2015-10-23 DIAGNOSIS — R0902 Hypoxemia: Secondary | ICD-10-CM

## 2015-10-23 DIAGNOSIS — J438 Other emphysema: Secondary | ICD-10-CM | POA: Diagnosis not present

## 2015-10-23 DIAGNOSIS — E662 Morbid (severe) obesity with alveolar hypoventilation: Secondary | ICD-10-CM

## 2015-10-23 DIAGNOSIS — I509 Heart failure, unspecified: Secondary | ICD-10-CM | POA: Insufficient documentation

## 2015-10-23 DIAGNOSIS — R911 Solitary pulmonary nodule: Secondary | ICD-10-CM | POA: Diagnosis not present

## 2015-10-23 DIAGNOSIS — R918 Other nonspecific abnormal finding of lung field: Secondary | ICD-10-CM | POA: Insufficient documentation

## 2015-10-23 NOTE — Patient Instructions (Addendum)
Change oxygen to 2L at night.  Continue daytime oxygen at 1L when leaving the house, otherwise does not need to use daytime oxygen at all times.   --PFT in 3 months.  --CT chest without contrast in 3 months re: lung nodules.

## 2015-10-23 NOTE — Progress Notes (Signed)
* [facility]     Assessment and Plan:  Severe COPD. -Continue dulera, spiriva, albuterol. Her COPD appears stable at this time and not in exacerbation. Ambulatory desats with normal resting oxygenation. Based on recent LOTT study can discontinue ambulatory oxygen. Will continue nocturnal oxygen at 2L and advised her to continue to use the oxygen at 2L when leaving the house.  --Will need PFT before next visit.   Morbid obesity with Obesity hypoventilation syndrome. -Weight loss and increased physical activity would be beneficial. -Chronic hypercapnic respiratory failure with PCO2 of 61.  -She is intolerant to BiPAP. We discussed that we could revisit this, should she have recurrent admissions due to COPD/hypoventilation. -Given that she does not tolerate Pap. We'll hold off on sleep study for now  Chronic volume overload. -We'll monitor, this is likely contributing to her dyspnea.  Lung nodules.. -Will need outpatient follow-up due to enlarging pulmonary nodules. --Repeat CT chest in 3 months.      Date: 10/23/2015  MRN# [id number] Martha Woodard 1965-10-21   Martha Woodard is a 50 y.o. old female seen in follow up for chief complaint of  Chief Complaint  Patient presents with  . Hospitalization Follow-up    pt. states breathing has improved. dry cough. denies SOB, cough, wheezing or chest pain/tightness. on 3L 02     HPI:   She was discharged from the hospital on 08/31/15, and feels that her breathing has been doing well since then. She was discharged on 3L constantly. She is using spiriva once daily, and dulera 2 puffs twice daily. She uses albuterol nebs once daily, and notes that it helps.  She has not been active because back pain.   Review of images CT chest 09/27/2015 and compared with previous images: This showed no evidence of pulmonary emboli, however, it showed some small  lung nodules on the right lung, as well as diffuse air  trapping.  Echocardiogram 09/28/2015: EF is 60-65%, left atrium was mildly dilated. Right ventricular systolic function was normal, pulmonary artery systolic pressure was reported as within normal limits.  Ambulatory oxygen checked today, beginning O2 sat was 91% and HR of 81 on RA. After walking 600 feet she had moderate dyspnea with o2 sat of 85% anf HR 88.   Medication:   Outpatient Encounter Prescriptions as of 10/23/2015  Medication Sig  . albuterol (PROVENTIL) (2.5 MG/3ML) 0.083% nebulizer solution Take 3 mLs (2.5 mg total) by nebulization every 6 (six) hours as needed for wheezing or shortness of breath.  Marland Kitchen atorvastatin (LIPITOR) 10 MG tablet Take 1 tablet (10 mg total) by mouth daily.  . DiphenhydrAMINE HCl (ALLERGY MED PO) Take 1-2 capsules by mouth every 4 (four) hours as needed.  . fluticasone (FLONASE) 50 MCG/ACT nasal spray Place 1 spray into both nostrils daily.   . hydrochlorothiazide (HYDRODIURIL) 12.5 MG tablet Take 1 tablet (12.5 mg total) by mouth daily.  Marland Kitchen ibuprofen (ADVIL,MOTRIN) 200 MG tablet Take 200 mg by mouth every 6 (six) hours as needed.  Marland Kitchen losartan (COZAAR) 25 MG tablet Take 1 tablet (25 mg total) by mouth 2 (two) times daily.  . metFORMIN (GLUCOPHAGE) 500 MG tablet Take 1 tablet (500 mg total) by mouth 2 (two) times daily with a meal.  . metoprolol succinate (TOPROL-XL) 25 MG 24 hr tablet Take 1 tablet (25 mg total) by mouth daily.  . mometasone-formoterol (DULERA) 100-5 MCG/ACT AERO Inhale 2 puffs into the lungs 2 (two) times daily.  . Multiple Vitamin (MULTIVITAMIN)  tablet Take 1 tablet by mouth daily.  Marland Kitchen nystatin (MYCOSTATIN) 100000 UNIT/ML suspension Use as directed 5 mLs (500,000 Units total) in the mouth or throat 4 (four) times daily.  Marland Kitchen tiotropium (SPIRIVA) 18 MCG inhalation capsule Place 1 capsule (18 mcg total) into inhaler and inhale daily.  . VENTOLIN HFA 108 (90 BASE) MCG/ACT inhaler INHALE 2 PUFFS BY MOUTH 4 TIMES DAILY AS NEEDED.   No  facility-administered encounter medications on file as of 10/23/2015.     Allergies:  Augmentin  Review of Systems: Gen:  Denies  fever, sweats. HEENT: Denies blurred vision. Cvc:  No dizziness, chest pain or heaviness Resp:   Denies cough or sputum porduction. Gi: Denies swallowing difficulty, stomach pain. constipation, bowel incontinence Gu:  Denies bladder incontinence, burning urine Ext:   No Joint pain, stiffness. Skin: No skin rash, easy bruising. Endoc:  No polyuria, polydipsia. Psych: No depression, insomnia. Other:  All other systems were reviewed and found to be negative other than what is mentioned in the HPI.   Physical Examination:   VS: BP 126/64 mmHg  Pulse 81  Ht 5\' 4"  (1.626 m)  Wt 281 lb 12.8 oz (127.824 kg)  BMI 48.35 kg/m2  SpO2 95%  General Appearance: No distress  Neuro:without focal findings,  speech normal,  HEENT: PERRLA, EOM intact. Pulmonary: normal breath sounds, No wheezing.   CardiovascularNormal S1,S2.  No m/r/g.   Abdomen: Benign, Soft, non-tender. Renal:  No costovertebral tenderness  GU:  Not performed at this time. Endoc: No evident thyromegaly, no signs of acromegaly. Skin:   warm, no rash. Extremities: normal, no cyanosis, clubbing.   LABORATORY PANEL:   CBC No results for input(s): WBC, HGB, HCT, PLT in the last 168 hours. ------------------------------------------------------------------------------------------------------------------  Chemistries  No results for input(s): NA, K, CL, CO2, GLUCOSE, BUN, CREATININE, CALCIUM, MG, AST, ALT, ALKPHOS, BILITOT in the last 168 hours.  Invalid input(s): GFRCGP ------------------------------------------------------------------------------------------------------------------  Cardiac Enzymes No results for input(s): TROPONINI in the last 168 hours. ------------------------------------------------------------  RADIOLOGY:   No results found for this or any previous visit. Results  for orders placed during the hospital encounter of 09/25/15  DG Chest 2 View   Narrative CLINICAL DATA:  Severe shortness of Breath.  EXAM: CHEST  2 VIEW  COMPARISON:  09/25/2015.  FINDINGS: Bibasilar atelectasis noted. The lungs are clear wiithout focal pneumonia, edema, pneumothorax or pleural effusion. Cardiopericardial silhouette is at upper limits of normal for size. The visualized bony structures of the thorax are intact. Telemetry leads overlie the chest.  IMPRESSION: Borderline cardiomegaly with basilar atelectasis.   Electronically Signed   By: Misty Stanley M.D.   On: 09/30/2015 14:24    ------------------------------------------------------------------------------------------------------------------  Thank  you for allowing [facility] to assist in the care of your patient. Our recommendations are noted above.  Please contact us if we can be of further service.   Marda Stalker, MD.  [facility] Office Number: [phone]  Patricia Pesa, M.D.  Vilinda Boehringer, M.D.  Merton Border, M.D

## 2015-11-13 ENCOUNTER — Encounter: Payer: Self-pay | Admitting: Family Medicine

## 2015-11-13 ENCOUNTER — Ambulatory Visit (INDEPENDENT_AMBULATORY_CARE_PROVIDER_SITE_OTHER): Payer: BLUE CROSS/BLUE SHIELD | Admitting: Family Medicine

## 2015-11-13 ENCOUNTER — Other Ambulatory Visit: Payer: Self-pay | Admitting: Family Medicine

## 2015-11-13 VITALS — BP 122/70 | HR 78 | Temp 98.5°F | Ht 65.5 in | Wt 273.5 lb

## 2015-11-13 DIAGNOSIS — J439 Emphysema, unspecified: Secondary | ICD-10-CM | POA: Insufficient documentation

## 2015-11-13 DIAGNOSIS — E662 Morbid (severe) obesity with alveolar hypoventilation: Secondary | ICD-10-CM

## 2015-11-13 DIAGNOSIS — E041 Nontoxic single thyroid nodule: Secondary | ICD-10-CM

## 2015-11-13 DIAGNOSIS — I10 Essential (primary) hypertension: Secondary | ICD-10-CM

## 2015-11-13 DIAGNOSIS — E119 Type 2 diabetes mellitus without complications: Secondary | ICD-10-CM

## 2015-11-13 DIAGNOSIS — Z0000 Encounter for general adult medical examination without abnormal findings: Secondary | ICD-10-CM

## 2015-11-13 DIAGNOSIS — E785 Hyperlipidemia, unspecified: Secondary | ICD-10-CM

## 2015-11-13 DIAGNOSIS — Z1211 Encounter for screening for malignant neoplasm of colon: Secondary | ICD-10-CM | POA: Diagnosis not present

## 2015-11-13 DIAGNOSIS — Z1239 Encounter for other screening for malignant neoplasm of breast: Secondary | ICD-10-CM

## 2015-11-13 DIAGNOSIS — I5032 Chronic diastolic (congestive) heart failure: Secondary | ICD-10-CM | POA: Insufficient documentation

## 2015-11-13 LAB — LIPID PANEL
CHOL/HDL RATIO: 3
CHOLESTEROL: 133 mg/dL (ref 0–200)
HDL: 41.6 mg/dL (ref >39.00)
LDL Cholesterol: 67 mg/dL (ref 0–99)
NonHDL: 91.01
TRIGLYCERIDES: 119 mg/dL (ref 0.0–149.0)
VLDL: 23.8 mg/dL (ref 0.0–40.0)

## 2015-11-13 LAB — LDL CHOLESTEROL, DIRECT: Direct LDL: 71 mg/dL

## 2015-11-13 MED ORDER — METFORMIN HCL 500 MG PO TABS: 1000.0000 mg | ORAL_TABLET | Freq: Two times a day (BID) | ORAL | Status: DC

## 2015-11-13 NOTE — Progress Notes (Signed)
Pre visit review using our clinic review tool, if applicable. No additional management support is needed unless otherwise documented below in the visit note. 

## 2015-11-13 NOTE — Progress Notes (Addendum)
Subjective:  Patient ID: Martha Woodard, female    DOB: Oct 31, 1965  Age: 50 y.o. MRN: [id number]  CC: Establish care  HPI Martha Woodard is a 50 y.o. female presents to the clinic today to establish care with me.   COPD/Pulmonary nodules  Stable.  Patient is currently on oxygen at night, 2L [place].  She endorses compliance with Dulera and Spiriva.  Followed by Pulm.  HTN  Well controlled on HCTZ, metoprolol, losartan.  DM-2  Relatively well controlled.  Last A1C was 7.2.  Currently on metformin 500 twice a day.  HLD  No lipid panel in the chart. She is currently on Lipitor.  Needs labs today.  Thyroid cyst  Has had workup in the past including FNA.  Has not followed by with Gen surg. Their last note in 04/2014 states that she will need follow up US.  She is currently symptomatic and has issues with swallowing as well as hoarseness.  Chronic diastolic heart failure  Patient recently admitted at the end of December/early January.  Per the hospital discharge summary, patient was admitted with a COPD exacerbation as well as acute diastolic heart failure.  She has lost weight.  She endorses compliance with her medications. She is not on Lasix even though the discharge summary reflects that she is supposed to be.  Patient will need cardiology follow-up.  Obesity Hypoventilation   Does not tolerate BiPAP. Is currently on oxygen at night.  PMH, Surgical Hx, Family Hx, Social History reviewed and updated as below.  Past Medical History  Diagnosis Date  . Diabetes mellitus without complication ([place])   . Hypertension   . COPD (chronic obstructive pulmonary disease) ([place])   . Multiple lung nodules on CT   . HLD (hyperlipidemia)   . Diastolic heart failure ([address])   . Morbid obesity with BMI of 45.0-49.9, adult [facility])    Past Surgical History  Procedure Laterality Date  . Wisdom tooth extraction    . Biopsy thyroid Right 02-06-14    NEGATIVE FOR MALIGNANT  CELLS proteinaceous material and macrophages.  . Thryoid fna Right April 2015    Proteinaceous material and macrophages.   Family History  Problem Relation Age of Onset  . Cancer Mother 52    uterian and ovarian   . Goiter Mother   . Stroke Father   . COPD Sister   . Stroke Brother   . Heart murmur Sister   .      Social History  Substance Use Topics  . Smoking status: Former Smoker -- 87 years    Quit date: 07/10/2013  . Smokeless tobacco: Not on file  . Alcohol Use: No    Review of Systems  HENT: Positive for trouble swallowing and voice change.   Cardiovascular: Positive for palpitations and leg swelling.  Gastrointestinal: Positive for constipation.  Endocrine: Positive for polydipsia.  Musculoskeletal: Positive for myalgias and arthralgias.  Neurological: Positive for dizziness, weakness and numbness.  Psychiatric/Behavioral:       Stress.   All other systems reviewed and are negative.  Objective:   Today's Vitals: BP 122/70 mmHg  Pulse 78  Temp(Src) 98.5 F (36.9 C) (Oral)  Ht 5' 5.5" (1.664 m)  Wt 273 lb 8 oz (124.059 kg)  BMI 44.80 kg/m2  SpO2 94%  Physical Exam  Constitutional: She is oriented to person, place, and time.  Chronically ill-appearing obese female, nasal cannula oxygen in place. No acute distress.  HENT:  Head: Normocephalic and atraumatic.  Mouth/Throat:  Oropharynx is clear and moist. No oropharyngeal exudate.  Normal TM's bilaterally.   Eyes: Conjunctivae are normal. No scleral icterus.  Neck: Neck supple.  No palpable thyromegaly. Exam limited by body habitus.  Cardiovascular: Normal rate and regular rhythm.   No murmur heard. 2+ pitting edema of the lower extremities bilaterally.  Pulmonary/Chest: Effort normal and breath sounds normal. She has no wheezes. She has no rales.  Abdominal: Soft. She exhibits no distension. There is no tenderness. There is no rebound and no guarding.  Lymphadenopathy:    She has no cervical adenopathy.   Neurological: She is alert and oriented to person, place, and time.  Skin: Skin is warm and dry. No rash noted.  Psychiatric: She has a normal mood and affect.  Vitals reviewed.  Assessment & Plan:   Problem List Items Addressed This Visit    Thyroid cyst    Needs follow with Dr. Bary Castilla for [place].       DM type 2 (diabetes mellitus, type 2) ([place]) - Primary    Not a goal. Recent A1c was 7.2. Increasing metformin to 1000 mg twice a day. Renal function okay.      Relevant Medications   metFORMIN (GLUCOPHAGE) 500 MG tablet   Hyperlipidemia    Lipid panel revealed good control. Continue Lipitor.      Relevant Orders   Direct LDL (Completed)   Lipid Profile (Completed)   Obesity hypoventilation syndrome ([place])    Needs CPAP/BIPAP but is intolerant. Continuing oxygen at home.      Relevant Medications   metFORMIN (GLUCOPHAGE) 500 MG tablet   Essential hypertension    Well controlled. Continue current medication regimen.      Chronic diastolic heart failure ([facility])    Recent diagnosis. Weight is down.  She does have pitting edema on exam today.  Currently on HCTZ, Metoprolol, and Losartan. Not on lasix.  Has seen cards in the past. Recommended returning for close follow up.      Preventative health care    Scheduling colonoscopy and mammogram. Needs eye exam. Encouraged to see Ophtho. Will follow up for pap smear.         Other Visit Diagnoses    Encounter for screening colonoscopy        Relevant Orders    Ambulatory referral to Gastroenterology    Breast cancer screening        Relevant Orders    MM Digital Screening       Outpatient Encounter Prescriptions as of 11/13/2015  Medication Sig  . albuterol (PROVENTIL) (2.5 MG/3ML) 0.083% nebulizer solution Take 3 mLs (2.5 mg total) by nebulization every 6 (six) hours as needed for wheezing or shortness of breath.  Marland Kitchen atorvastatin (LIPITOR) 10 MG tablet Take 1 tablet (10 mg total) by mouth daily.  . fluticasone  (FLONASE) 50 MCG/ACT nasal spray Place 1 spray into both nostrils daily.   . hydrochlorothiazide (HYDRODIURIL) 12.5 MG tablet Take 1 tablet (12.5 mg total) by mouth daily.  Marland Kitchen losartan (COZAAR) 25 MG tablet Take 1 tablet (25 mg total) by mouth 2 (two) times daily.  . metFORMIN (GLUCOPHAGE) 500 MG tablet Take 2 tablets (1,000 mg total) by mouth 2 (two) times daily with a meal.  . metoprolol succinate (TOPROL-XL) 25 MG 24 hr tablet Take 1 tablet (25 mg total) by mouth daily.  . mometasone-formoterol (DULERA) 100-5 MCG/ACT AERO Inhale 2 puffs into the lungs 2 (two) times daily.  . Multiple Vitamin (MULTIVITAMIN) tablet Take 1 tablet by mouth  daily.  . OXYGEN Inhale 2 L into the lungs daily. 2 liters at night, 1 liter when doing activities, and no oxygen when at home during the day.  . tiotropium (SPIRIVA) 18 MCG inhalation capsule Place 1 capsule (18 mcg total) into inhaler and inhale daily.  . VENTOLIN HFA 108 (90 BASE) MCG/ACT inhaler INHALE 2 PUFFS BY MOUTH 4 TIMES DAILY AS NEEDED.  . [DISCONTINUED] DiphenhydrAMINE HCl (ALLERGY MED PO) Take 1-2 capsules by mouth every 4 (four) hours as needed.  . [DISCONTINUED] metFORMIN (GLUCOPHAGE) 500 MG tablet Take 1 tablet (500 mg total) by mouth 2 (two) times daily with a meal.  . [DISCONTINUED] nystatin (MYCOSTATIN) 100000 UNIT/ML suspension Use as directed 5 mLs (500,000 Units total) in the mouth or throat 4 (four) times daily.  . [DISCONTINUED] ibuprofen (ADVIL,MOTRIN) 200 MG tablet Take 200 mg by mouth every 6 (six) hours as needed.   No facility-administered encounter medications on file as of 11/13/2015.    Follow-up: Return in about 3 months (around 02/10/2016).  Love

## 2015-11-13 NOTE — Patient Instructions (Signed)
Increase your metformin to 1000 mg twice daily.  Continue your other medications.   We will call with your colonoscopy appt as well as the mammogram.  Please see your eye doctor.  Follow up in 3 months; we will do pap smear at that time.  Take care  Dr. Lacinda Axon

## 2015-11-14 DIAGNOSIS — Z0000 Encounter for general adult medical examination without abnormal findings: Secondary | ICD-10-CM | POA: Insufficient documentation

## 2015-11-14 NOTE — Assessment & Plan Note (Signed)
Scheduling colonoscopy and mammogram. Needs eye exam. Encouraged to see Ophtho. Will follow up for pap smear.

## 2015-11-14 NOTE — Assessment & Plan Note (Signed)
Needs CPAP/BIPAP but is intolerant. Continuing oxygen at home.

## 2015-11-14 NOTE — Assessment & Plan Note (Signed)
Lipid panel revealed good control. Continue Lipitor.

## 2015-11-14 NOTE — Assessment & Plan Note (Addendum)
Needs follow with Dr. Bary Castilla for [place].

## 2015-11-14 NOTE — Assessment & Plan Note (Signed)
Well controlled. Continue current medication regimen.  

## 2015-11-14 NOTE — Assessment & Plan Note (Signed)
Not a goal. Recent A1c was 7.2. Increasing metformin to 1000 mg twice a day. Renal function okay.

## 2015-11-14 NOTE — Assessment & Plan Note (Signed)
Recent diagnosis. Weight is down.  She does have pitting edema on exam today.  Currently on HCTZ, Metoprolol, and Losartan. Not on lasix.  Has seen cards in the past. Recommended returning for close follow up.

## 2015-11-15 ENCOUNTER — Other Ambulatory Visit: Payer: Self-pay | Admitting: Family Medicine

## 2015-11-15 DIAGNOSIS — I5032 Chronic diastolic (congestive) heart failure: Secondary | ICD-10-CM

## 2015-11-21 ENCOUNTER — Telehealth: Payer: Self-pay

## 2015-11-21 ENCOUNTER — Other Ambulatory Visit: Payer: Self-pay

## 2015-11-21 NOTE — Telephone Encounter (Signed)
Gastroenterology Pre-Procedure Review  Request Date: 01/01/16 Requesting Physician: Dr. Lacinda Axon - Smitty Pluck  PATIENT REVIEW QUESTIONS: The patient responded to the following health history questions as indicated:    1. Are you having any GI issues? no 2. Do you have a personal history of Polyps? no 3. Do you have a family history of Colon Cancer or Polyps? yes (Sister polyps) 4. Diabetes Mellitus? yes (Type 2) 5. Joint replacements in the past 12 months?no 6. Major health problems in the past 3 months?yes (09/24/16 - ER - Retaining fluid, SOB, CHF released 10/01/15) 7. Any artificial heart valves, MVP, or defibrillator?no    MEDICATIONS & ALLERGIES:    Patient reports the following regarding taking any anticoagulation/antiplatelet therapy:   Plavix, Coumadin, Eliquis, Xarelto, Lovenox, Pradaxa, Brilinta, or Effient? no Aspirin? no  Patient confirms/reports the following medications:  Current Outpatient Prescriptions  Medication Sig Dispense Refill  . albuterol (PROVENTIL) (2.5 MG/3ML) 0.083% nebulizer solution Take 3 mLs (2.5 mg total) by nebulization every 6 (six) hours as needed for wheezing or shortness of breath. 75 mL 12  . atorvastatin (LIPITOR) 10 MG tablet Take 1 tablet (10 mg total) by mouth daily. 30 tablet 12  . fluticasone (FLONASE) 50 MCG/ACT nasal spray Place 1 spray into both nostrils daily.     . hydrochlorothiazide (HYDRODIURIL) 12.5 MG tablet Take 1 tablet (12.5 mg total) by mouth daily. 30 tablet 6  . losartan (COZAAR) 25 MG tablet Take 1 tablet (25 mg total) by mouth 2 (two) times daily. 60 tablet 5  . metFORMIN (GLUCOPHAGE) 500 MG tablet Take 2 tablets (1,000 mg total) by mouth 2 (two) times daily with a meal. 120 tablet 3  . metoprolol succinate (TOPROL-XL) 25 MG 24 hr tablet Take 1 tablet (25 mg total) by mouth daily. 30 tablet 12  . mometasone-formoterol (DULERA) 100-5 MCG/ACT AERO Inhale 2 puffs into the lungs 2 (two) times daily. 1 Inhaler 5  . Multiple Vitamin  (MULTIVITAMIN) tablet Take 1 tablet by mouth daily.    . OXYGEN Inhale 2 L into the lungs daily. 2 liters at night, 1 liter when doing activities, and no oxygen when at home during the day.    . tiotropium (SPIRIVA) 18 MCG inhalation capsule Place 1 capsule (18 mcg total) into inhaler and inhale daily. 30 capsule 12  . VENTOLIN HFA 108 (90 BASE) MCG/ACT inhaler INHALE 2 PUFFS BY MOUTH 4 TIMES DAILY AS NEEDED. 18 g 2   No current facility-administered medications for this visit.    Patient confirms/reports the following allergies:  Allergies  Allergen Reactions  . Augmentin [Amoxicillin-Pot Clavulanate] Itching    No orders of the defined types were placed in this encounter.    AUTHORIZATION INFORMATION Primary Insurance: 1D#: Group #:  Secondary Insurance: 1D#: Group #:  SCHEDULE INFORMATION: Date:  01/01/16 Time: Location: [place]

## 2015-11-22 ENCOUNTER — Encounter: Payer: Self-pay | Admitting: Cardiovascular Disease

## 2015-11-22 ENCOUNTER — Ambulatory Visit (INDEPENDENT_AMBULATORY_CARE_PROVIDER_SITE_OTHER): Payer: BLUE CROSS/BLUE SHIELD | Admitting: Cardiovascular Disease

## 2015-11-22 VITALS — BP 138/62 | HR 87 | Ht 65.5 in | Wt 273.2 lb

## 2015-11-22 DIAGNOSIS — J432 Centrilobular emphysema: Secondary | ICD-10-CM | POA: Diagnosis not present

## 2015-11-22 DIAGNOSIS — Z6841 Body Mass Index (BMI) 40.0 and over, adult: Secondary | ICD-10-CM

## 2015-11-22 DIAGNOSIS — I5032 Chronic diastolic (congestive) heart failure: Secondary | ICD-10-CM | POA: Diagnosis not present

## 2015-11-22 DIAGNOSIS — E1159 Type 2 diabetes mellitus with other circulatory complications: Secondary | ICD-10-CM

## 2015-11-22 DIAGNOSIS — I10 Essential (primary) hypertension: Secondary | ICD-10-CM | POA: Diagnosis not present

## 2015-11-22 DIAGNOSIS — E785 Hyperlipidemia, unspecified: Secondary | ICD-10-CM

## 2015-11-22 MED ORDER — FUROSEMIDE 20 MG PO TABS: 20.0000 mg | ORAL_TABLET | Freq: Every day | ORAL | Status: DC | PRN

## 2015-11-22 NOTE — Progress Notes (Signed)
Patient ID: Martha Woodard, female    DOB: 1965-12-06, 50 y.o.   MRN: [id number]  HPI Comments: [name] is a very pleasant 50 year old woman with a history of diabetes, asthma, COPD, lung nodules with recent presentation to the hospital for acute respiratory distress, 52 pound weight gain, hypoxia, diagnosed with COPD exacerbation and acute diastolic CHF with obstructive sleep apnea, obesity hypoventilation. She presents for follow-up in the clinic for chronic diastolic CHF  Since her discharge, weight was initially 280 pounds, now down to 270 pounds Prior to admission to the hospital weight was 52 pounds She does report that she continues to have high fluid intake, drinks 3 full  Hospital Jugs of water per day She has started getting swelling in her right lower extremity but also reports she has been on her feet a lot She denies any significant shortness of breath on exertion Denies any recurrent COPD exacerbation Oxygen is being weaned by pulmonary, now down to 1 L during the daytime on exertion, none at rest, 2 L overnight when sleeping  EKG on today's visit shows normal sinus rhythm with rate 87 bpm, no significant ST or T-wave changes  Recent hospital admission records reviewed On presentation to the hospital, She was profoundly hypoxic with O2 sats in the 70s and was initiated on BiPAP mask.   labs revealed hypercapnia pCO2 level of 79, hypoxia, hyperglycemia.  Chest x-ray revealed cardiomegaly but no edema or consolidation.    CTA of the chest showed no pulmonary embolus. Bilateral lower lobe atelectasis, pulmonary edema versus interstitial pneumonitis superimposed on changes of COPD and right lung nodules which were unchanged.   She was initiated on steroids, antibiotics, inhalation therapy as well as diuretics.    hypoxic intermittently, especially at nighttime and sitting in the bed, concerning for obesity hypoventilation syndrome.     Allergies  Allergen Reactions  .  Augmentin [Amoxicillin-Pot Clavulanate] Itching    Current Outpatient Prescriptions on File Prior to Visit  Medication Sig Dispense Refill  . albuterol (PROVENTIL) (2.5 MG/3ML) 0.083% nebulizer solution Take 3 mLs (2.5 mg total) by nebulization every 6 (six) hours as needed for wheezing or shortness of breath. 75 mL 12  . atorvastatin (LIPITOR) 10 MG tablet Take 1 tablet (10 mg total) by mouth daily. 30 tablet 12  . fluticasone (FLONASE) 50 MCG/ACT nasal spray Place 1 spray into both nostrils daily.     . hydrochlorothiazide (HYDRODIURIL) 12.5 MG tablet Take 1 tablet (12.5 mg total) by mouth daily. 30 tablet 6  . losartan (COZAAR) 25 MG tablet Take 1 tablet (25 mg total) by mouth 2 (two) times daily. 60 tablet 5  . metFORMIN (GLUCOPHAGE) 500 MG tablet Take 2 tablets (1,000 mg total) by mouth 2 (two) times daily with a meal. 120 tablet 3  . metoprolol succinate (TOPROL-XL) 25 MG 24 hr tablet Take 1 tablet (25 mg total) by mouth daily. 30 tablet 12  . mometasone-formoterol (DULERA) 100-5 MCG/ACT AERO Inhale 2 puffs into the lungs 2 (two) times daily. 1 Inhaler 5  . Multiple Vitamin (MULTIVITAMIN) tablet Take 1 tablet by mouth daily.    . OXYGEN Inhale 2 L into the lungs daily. 2 liters at night, 1 liter when doing activities, and no oxygen when at home during the day.    . tiotropium (SPIRIVA) 18 MCG inhalation capsule Place 1 capsule (18 mcg total) into inhaler and inhale daily. 30 capsule 12  . VENTOLIN HFA 108 (90 BASE) MCG/ACT inhaler INHALE 2 PUFFS BY  MOUTH 4 TIMES DAILY AS NEEDED. 18 g 2   No current facility-administered medications on file prior to visit.    Past Medical History  Diagnosis Date  . Diabetes mellitus without complication ([place])   . Hypertension   . COPD (chronic obstructive pulmonary disease) ([place])   . Multiple lung nodules on CT   . HLD (hyperlipidemia)   . Diastolic heart failure ([place])   . Morbid obesity with BMI of 45.0-49.9, adult [facility])     Past Surgical  History  Procedure Laterality Date  . Wisdom tooth extraction    . Biopsy thyroid Right 02-06-14    NEGATIVE FOR MALIGNANT CELLS proteinaceous material and macrophages.  . Thryoid fna Right April 2015    Proteinaceous material and macrophages.    Social History  reports that she quit smoking about 52 years ago. She does not have any smokeless tobacco history on file. She reports that she does not drink alcohol or use illicit drugs.  Family History family history includes COPD in her sister; Cancer (age of onset: 43) in her mother; Goiter in her mother; Heart murmur in her sister; Stroke in her brother and father.   Review of Systems  Constitutional: Negative.   Respiratory: Negative.   Cardiovascular: Negative.   Gastrointestinal: Negative.   Musculoskeletal: Negative.   Allergic/Immunologic: Negative.   Neurological: Negative.   Hematological: Negative.   Psychiatric/Behavioral: Negative.   All other systems reviewed and are negative.   BP 138/62 mmHg  Pulse 87  Ht 5' 5.5" (1.664 m)  Wt 273 lb 4 oz (123.945 kg)  BMI 44.76 kg/m2  Physical Exam  Constitutional: She is oriented to person, place, and time. She appears well-developed and well-nourished.  Obese  HENT:  Head: Normocephalic.  Nose: Nose normal.  Mouth/Throat: Oropharynx is clear and moist.  Eyes: Conjunctivae are normal. Pupils are equal, round, and reactive to light.  Neck: Normal range of motion. Neck supple. No JVD present.  Cardiovascular: Normal rate, regular rhythm, normal heart sounds and intact distal pulses.  Exam reveals no gallop and no friction rub.   No murmur heard. Trace pretibial edema right greater than left  Pulmonary/Chest: Effort normal and breath sounds normal. No respiratory distress. She has no wheezes. She has no rales. She exhibits no tenderness.  Abdominal: Soft. Bowel sounds are normal. She exhibits no distension. There is no tenderness.  Musculoskeletal: Normal range of motion. She  exhibits no edema or tenderness.  Lymphadenopathy:    She has no cervical adenopathy.  Neurological: She is alert and oriented to person, place, and time. Coordination normal.  Skin: Skin is warm and dry. No rash noted. No erythema.  Psychiatric: She has a normal mood and affect. Her behavior is normal. Judgment and thought content normal.

## 2015-11-22 NOTE — Assessment & Plan Note (Signed)
Blood pressure is well controlled on today's visit. No changes made to the medications. 

## 2015-11-22 NOTE — Patient Instructions (Signed)
You are doing well. No medication changes were made.  Continue to monitor your weight, For weight greater than 275, Take a lasix with potassium  Please call us if you have new issues that need to be addressed before your next appt.  Your physician wants you to follow-up in: 6 months.  You will receive a reminder letter in the mail two months in advance. If you don't receive a letter, please call our office to schedule the follow-up appointment.

## 2015-11-22 NOTE — Assessment & Plan Note (Signed)
Managed by pulmonary, slowly weaning off oxygen Currently very stable

## 2015-11-22 NOTE — Assessment & Plan Note (Signed)
We have encouraged continued exercise, careful diet management in an effort to lose weight. Hemoglobin A1c 7.2

## 2015-11-22 NOTE — Assessment & Plan Note (Signed)
Weight is relatively stable, perhaps to with 2-4 pound weight gain recently We have provided her with Lasix to take only as needed for weight greater than 275 pounds Recommended she decrease her fluid intake

## 2015-11-22 NOTE — Assessment & Plan Note (Signed)
Discussed her diet with her Currently she has a very strict diet, slowly losing weight   Total encounter time more than 25 minutes  Greater than 50% was spent in counseling and coordination of care with the patient

## 2015-11-22 NOTE — Assessment & Plan Note (Signed)
Recommended that she stay on her Lipitor Cholesterol is at goal on the current lipid regimen. No changes to the medications were made.

## 2015-11-27 ENCOUNTER — Encounter: Payer: Self-pay | Admitting: General Surgery

## 2015-11-27 ENCOUNTER — Ambulatory Visit (INDEPENDENT_AMBULATORY_CARE_PROVIDER_SITE_OTHER): Payer: BLUE CROSS/BLUE SHIELD | Admitting: General Surgery

## 2015-11-27 ENCOUNTER — Other Ambulatory Visit: Payer: Self-pay

## 2015-11-27 VITALS — BP 142/80 | HR 78 | Resp 14 | Ht 65.0 in | Wt 270.0 lb

## 2015-11-27 DIAGNOSIS — E041 Nontoxic single thyroid nodule: Secondary | ICD-10-CM | POA: Diagnosis not present

## 2015-11-27 NOTE — Progress Notes (Signed)
Patient ID: Martha Woodard, female   DOB: 19-Jun-1966, 50 y.o.   MRN: [id number]  Chief Complaint  Patient presents with  . Cyst    thyroid    HPI Martha Woodard is a 50 y.o. female.  Here today for follow up thyroid cyst. She was last evaluated for this in 2015. She is seeing a new primary doctor and he wanted it re-evaluated. She denies any difficulty swallowing. She does take her medications with food to help get them down which is not new. She is now making use of oxygen with activity.  I personally reviewed the patient's history.   HPI  Past Medical History  Diagnosis Date  . Diabetes mellitus without complication ([place])   . Hypertension   . COPD (chronic obstructive pulmonary disease) ([place])   . Multiple lung nodules on CT   . HLD (hyperlipidemia)   . Diastolic heart failure ([place])   . Morbid obesity with BMI of 45.0-49.9, adult [facility])     Past Surgical History  Procedure Laterality Date  . Wisdom tooth extraction    . Biopsy thyroid Right 02-06-14    NEGATIVE FOR MALIGNANT CELLS proteinaceous material and macrophages.  . Thryoid fna Right April 2015    Proteinaceous material and macrophages.    Family History  Problem Relation Age of Onset  . Cancer Mother 73    uterian and ovarian   . Goiter Mother   . Stroke Father   . COPD Sister   . Stroke Brother   . Heart murmur Sister   .       Social History Social History  Substance Use Topics  . Smoking status: Former Smoker -- 59 years    Quit date: 07/10/2013  . Smokeless tobacco: None  . Alcohol Use: No    Allergies  Allergen Reactions  . Augmentin [Amoxicillin-Pot Clavulanate] Itching    Current Outpatient Prescriptions  Medication Sig Dispense Refill  . albuterol (PROVENTIL) (2.5 MG/3ML) 0.083% nebulizer solution Take 3 mLs (2.5 mg total) by nebulization every 6 (six) hours as needed for wheezing or shortness of breath. 75 mL 12  . atorvastatin (LIPITOR) 10 MG tablet Take 1 tablet (10 mg total) by mouth  daily. 30 tablet 12  . fluticasone (FLONASE) 50 MCG/ACT nasal spray Place 1 spray into both nostrils daily.     . furosemide (LASIX) 20 MG tablet Take 1 tablet (20 mg total) by mouth daily as needed. 30 tablet 6  . hydrochlorothiazide (HYDRODIURIL) 12.5 MG tablet Take 1 tablet (12.5 mg total) by mouth daily. 30 tablet 6  . losartan (COZAAR) 25 MG tablet Take 1 tablet (25 mg total) by mouth 2 (two) times daily. 60 tablet 5  . metFORMIN (GLUCOPHAGE) 500 MG tablet Take 2 tablets (1,000 mg total) by mouth 2 (two) times daily with a meal. 120 tablet 3  . metoprolol succinate (TOPROL-XL) 25 MG 24 hr tablet Take 1 tablet (25 mg total) by mouth daily. 30 tablet 12  . mometasone-formoterol (DULERA) 100-5 MCG/ACT AERO Inhale 2 puffs into the lungs 2 (two) times daily. 1 Inhaler 5  . Multiple Vitamin (MULTIVITAMIN) tablet Take 1 tablet by mouth daily.    . OXYGEN Inhale 2 L into the lungs daily. 2 liters at night, 1 liter when doing activities, and no oxygen when at home during the day.    . tiotropium (SPIRIVA) 18 MCG inhalation capsule Place 1 capsule (18 mcg total) into inhaler and inhale daily. 30 capsule 12  . VENTOLIN HFA  108 (90 BASE) MCG/ACT inhaler INHALE 2 PUFFS BY MOUTH 4 TIMES DAILY AS NEEDED. 18 g 2   No current facility-administered medications for this visit.    Review of Systems Review of Systems  Constitutional: Negative.   Respiratory: Negative.   Cardiovascular: Negative.     Blood pressure 142/80, pulse 78, resp. rate 14, height 5\' 5"  (1.651 m), weight 270 lb (122.471 kg).  Physical Exam Physical Exam  Constitutional: She is oriented to person, place, and time. She appears well-developed and well-nourished.  HENT:  Mouth/Throat: Oropharynx is clear and moist.  Eyes: No scleral icterus.  Neck: Neck supple. Thyroid mass present.    3 cm right thyroid nodule.  Cardiovascular: Normal rate, regular rhythm and normal heart sounds.   Pulmonary/Chest: Effort normal and breath  sounds normal.  Lymphadenopathy:    She has no cervical adenopathy.  Neurological: She is alert and oriented to person, place, and time.  Skin: Skin is warm and dry.  Psychiatric: Her behavior is normal.    Data Reviewed Ultrasound examination of the thyroid was undertaken to assess the previously identified cystic lesions. The overall dimensions of the right lobe measured 1.5 x 1.95 x 3.35 cm. The dominant cystic lesion measured 0.95 x 1.2 x 1.3 cm. A secondary simple cyst in the medial aspect of the gland measured 0.7 x 0.9 x 0.94 cm. The overall dimensions of the left lobe were significantly more modest measuring 0.9 x 2.8 cm. Several small cystic lesions measuring up to 0.4 cm were noted. Mildly prominent nodule measuring up to 0.7 cm was appreciated in the upper pole. Faint acoustic enhancement appreciated.  Assessment    Stable thyroid nodule. Patient remains asymptomatic. Progressive deterioration of pulmonary function.    Plan        Follow up in one year with a repeat thyroid ultrasound at that time.Marland Kitchen  PCP/Ref:  Thersa Salt This information has been scribed by Karie Fetch RNBC.   Robert Bellow 11/28/2015, 7:59 PM

## 2015-11-27 NOTE — Patient Instructions (Signed)
The patient is aware to call back for any questions or concerns.  

## 2015-11-28 DIAGNOSIS — E041 Nontoxic single thyroid nodule: Secondary | ICD-10-CM | POA: Insufficient documentation

## 2015-11-29 LAB — HM MAMMOGRAPHY

## 2015-12-03 ENCOUNTER — Encounter: Payer: Self-pay | Admitting: Family Medicine

## 2015-12-26 ENCOUNTER — Telehealth: Payer: Self-pay | Admitting: Gastroenterology

## 2015-12-26 NOTE — Telephone Encounter (Signed)
Patient left a voice message that she would like to talk to you regarding her upcoming procedure.

## 2015-12-26 NOTE — Telephone Encounter (Signed)
Pt had questions regarding her low fiber diet and clear liquid diet day. All questions answer and pt verbalized understanding. Advised her to call me anytime with questions.

## 2015-12-28 LAB — HM MAMMOGRAPHY: HM MAMMO: ABNORMAL — AB (ref 0–4)

## 2015-12-31 ENCOUNTER — Telehealth: Payer: Self-pay | Admitting: *Deleted

## 2015-12-31 ENCOUNTER — Encounter: Payer: Self-pay | Admitting: *Deleted

## 2015-12-31 NOTE — Telephone Encounter (Signed)
Patient stated that  she received her results from her mammogram on 12/28/15. She will need a biopsy, and was  Wanting to know if Dr. Lacinda Axon will do the biopsy in the office, or if it should be done by Dr. Marlyn Corporal.  [phone]

## 2015-12-31 NOTE — Telephone Encounter (Signed)
Please advise 

## 2015-12-31 NOTE — Telephone Encounter (Signed)
Biopsy is scheduled/arranged through mammography.

## 2016-01-01 ENCOUNTER — Encounter: Payer: Self-pay | Admitting: Family Medicine

## 2016-01-01 ENCOUNTER — Other Ambulatory Visit: Payer: Self-pay | Admitting: Family Medicine

## 2016-01-01 ENCOUNTER — Ambulatory Visit: Payer: BLUE CROSS/BLUE SHIELD | Admitting: Anesthesiology

## 2016-01-01 ENCOUNTER — Ambulatory Visit
Admission: RE | Admit: 2016-01-01 | Discharge: 2016-01-01 | Disposition: A | Discharge status: Home / Self Care | Payer: BLUE CROSS/BLUE SHIELD | Source: Ambulatory Visit | Attending: Gastroenterology | Admitting: Gastroenterology

## 2016-01-01 ENCOUNTER — Encounter
Admission: RE | Disposition: A | Discharge status: Home / Self Care | Payer: Self-pay | Source: Ambulatory Visit | Attending: Gastroenterology

## 2016-01-01 DIAGNOSIS — Z8041 Family history of malignant neoplasm of ovary: Secondary | ICD-10-CM | POA: Insufficient documentation

## 2016-01-01 DIAGNOSIS — Z8049 Family history of malignant neoplasm of other genital organs: Secondary | ICD-10-CM | POA: Diagnosis not present

## 2016-01-01 DIAGNOSIS — Z79899 Other long term (current) drug therapy: Secondary | ICD-10-CM | POA: Insufficient documentation

## 2016-01-01 DIAGNOSIS — J449 Chronic obstructive pulmonary disease, unspecified: Secondary | ICD-10-CM | POA: Diagnosis not present

## 2016-01-01 DIAGNOSIS — Z881 Allergy status to other antibiotic agents status: Secondary | ICD-10-CM | POA: Diagnosis not present

## 2016-01-01 DIAGNOSIS — E119 Type 2 diabetes mellitus without complications: Secondary | ICD-10-CM | POA: Insufficient documentation

## 2016-01-01 DIAGNOSIS — R918 Other nonspecific abnormal finding of lung field: Secondary | ICD-10-CM | POA: Diagnosis not present

## 2016-01-01 DIAGNOSIS — Z825 Family history of asthma and other chronic lower respiratory diseases: Secondary | ICD-10-CM | POA: Diagnosis not present

## 2016-01-01 DIAGNOSIS — Z1211 Encounter for screening for malignant neoplasm of colon: Secondary | ICD-10-CM | POA: Diagnosis present

## 2016-01-01 DIAGNOSIS — J45909 Unspecified asthma, uncomplicated: Secondary | ICD-10-CM | POA: Diagnosis not present

## 2016-01-01 DIAGNOSIS — Z8349 Family history of other endocrine, nutritional and metabolic diseases: Secondary | ICD-10-CM | POA: Insufficient documentation

## 2016-01-01 DIAGNOSIS — Z9889 Other specified postprocedural states: Secondary | ICD-10-CM | POA: Diagnosis not present

## 2016-01-01 DIAGNOSIS — I10 Essential (primary) hypertension: Secondary | ICD-10-CM | POA: Insufficient documentation

## 2016-01-01 DIAGNOSIS — Z823 Family history of stroke: Secondary | ICD-10-CM | POA: Insufficient documentation

## 2016-01-01 DIAGNOSIS — R928 Other abnormal and inconclusive findings on diagnostic imaging of breast: Secondary | ICD-10-CM

## 2016-01-01 DIAGNOSIS — Z8249 Family history of ischemic heart disease and other diseases of the circulatory system: Secondary | ICD-10-CM | POA: Diagnosis not present

## 2016-01-01 DIAGNOSIS — D125 Benign neoplasm of sigmoid colon: Secondary | ICD-10-CM | POA: Insufficient documentation

## 2016-01-01 DIAGNOSIS — Z87891 Personal history of nicotine dependence: Secondary | ICD-10-CM | POA: Diagnosis not present

## 2016-01-01 DIAGNOSIS — I5032 Chronic diastolic (congestive) heart failure: Secondary | ICD-10-CM | POA: Diagnosis not present

## 2016-01-01 DIAGNOSIS — K641 Second degree hemorrhoids: Secondary | ICD-10-CM | POA: Insufficient documentation

## 2016-01-01 DIAGNOSIS — E785 Hyperlipidemia, unspecified: Secondary | ICD-10-CM | POA: Diagnosis not present

## 2016-01-01 DIAGNOSIS — Z6841 Body Mass Index (BMI) 40.0 and over, adult: Secondary | ICD-10-CM | POA: Insufficient documentation

## 2016-01-01 DIAGNOSIS — K5730 Diverticulosis of large intestine without perforation or abscess without bleeding: Secondary | ICD-10-CM | POA: Diagnosis not present

## 2016-01-01 DIAGNOSIS — K635 Polyp of colon: Secondary | ICD-10-CM

## 2016-01-01 DIAGNOSIS — Z7951 Long term (current) use of inhaled steroids: Secondary | ICD-10-CM | POA: Diagnosis not present

## 2016-01-01 DIAGNOSIS — Z7984 Long term (current) use of oral hypoglycemic drugs: Secondary | ICD-10-CM | POA: Insufficient documentation

## 2016-01-01 HISTORY — DX: Reserved for inherently not codable concepts without codable children: IMO0001

## 2016-01-01 HISTORY — DX: Polyp of colon: K63.5

## 2016-01-01 HISTORY — PX: COLONOSCOPY WITH PROPOFOL: SHX5780

## 2016-01-01 HISTORY — DX: Heart failure, unspecified: I50.9

## 2016-01-01 LAB — GLUCOSE, CAPILLARY: GLUCOSE-CAPILLARY: 98 mg/dL (ref 65–99)

## 2016-01-01 SURGERY — COLONOSCOPY WITH PROPOFOL
Anesthesia: General

## 2016-01-01 MED ORDER — LIDOCAINE HCL (PF) 2 % IJ SOLN
INTRAMUSCULAR | Status: DC | PRN
  Administered 2016-01-01: 80 mg via INTRADERMAL
  Administered 2016-01-01: 20 mg via INTRADERMAL

## 2016-01-01 MED ORDER — PROPOFOL 10 MG/ML IV BOLUS
INTRAVENOUS | Status: DC | PRN
  Administered 2016-01-01: 50 mg via INTRAVENOUS
  Administered 2016-01-01: 30 mg via INTRAVENOUS

## 2016-01-01 MED ORDER — SODIUM CHLORIDE 0.9 % IV SOLN
INTRAVENOUS | Status: DC
  Administered 2016-01-01: 1000 mL via INTRAVENOUS

## 2016-01-01 MED ORDER — PROPOFOL 500 MG/50ML IV EMUL
INTRAVENOUS | Status: DC | PRN
  Administered 2016-01-01: 150 ug/kg/min via INTRAVENOUS

## 2016-01-01 NOTE — H&P (Signed)
[facility]  [address], [place], [place] [address] Phone: [phone] Fax : [phone]  Primary Care Physician:  Coral Spikes, DO Primary Gastroenterologist:  Dr. Allen Norris  Pre-Procedure History & Physical: HPI:  Martha Woodard is a 50 y.o. female is here for a screening colonoscopy.   Past Medical History  Diagnosis Date  . Diabetes mellitus without complication ([place])   . Hypertension   . COPD (chronic obstructive pulmonary disease) ([place])   . Multiple lung nodules on CT   . HLD (hyperlipidemia)   . Diastolic heart failure ([place])   . Morbid obesity with BMI of 45.0-49.9, adult ([place])   . CHF (congestive heart failure) ([place])   . Shortness of breath dyspnea   . Asthma     Past Surgical History  Procedure Laterality Date  . Wisdom tooth extraction    . Biopsy thyroid Right 02-06-14    NEGATIVE FOR MALIGNANT CELLS proteinaceous material and macrophages.  . Thryoid fna Right April 2015    Proteinaceous material and macrophages.    Prior to Admission medications   Medication Sig Start Date End Date Taking? Authorizing Provider  fluticasone (FLONASE) 50 MCG/ACT nasal spray Place 1 spray into both nostrils daily.  03/22/14  Yes Historical Provider, MD  metoprolol succinate (TOPROL-XL) 25 MG 24 hr tablet Take 1 tablet (25 mg total) by mouth daily. 07/02/15  Yes Arlis Porta., MD  albuterol (PROVENTIL) (2.5 MG/3ML) 0.083% nebulizer solution Take 3 mLs (2.5 mg total) by nebulization every 6 (six) hours as needed for wheezing or shortness of breath. 06/11/15   Daymon Larsen, MD  atorvastatin (LIPITOR) 10 MG tablet Take 1 tablet (10 mg total) by mouth daily. 07/19/15   Arlis Porta., MD  furosemide (LASIX) 20 MG tablet Take 1 tablet (20 mg total) by mouth daily as needed. 11/22/15   Minna Merritts, MD  hydrochlorothiazide (HYDRODIURIL) 12.5 MG tablet Take 1 tablet (12.5 mg total) by mouth daily. 07/02/15   Arlis Porta., MD  losartan (COZAAR) 25 MG  tablet Take 1 tablet (25 mg total) by mouth 2 (two) times daily. 10/01/15   Theodoro Grist, MD  metFORMIN (GLUCOPHAGE) 500 MG tablet Take 2 tablets (1,000 mg total) by mouth 2 (two) times daily with a meal. 11/13/15   Coral Spikes, DO  mometasone-formoterol (DULERA) 100-5 MCG/ACT AERO Inhale 2 puffs into the lungs 2 (two) times daily. 10/01/15   Theodoro Grist, MD  Multiple Vitamin (MULTIVITAMIN) tablet Take 1 tablet by mouth daily.    Historical Provider, MD  OXYGEN Inhale 2 L into the lungs daily. 2 liters at night, 1 liter when doing activities, and no oxygen when at home during the day.    Historical Provider, MD  tiotropium (SPIRIVA) 18 MCG inhalation capsule Place 1 capsule (18 mcg total) into inhaler and inhale daily. 10/01/15   Theodoro Grist, MD  VENTOLIN HFA 108 (90 BASE) MCG/ACT inhaler INHALE 2 PUFFS BY MOUTH 4 TIMES DAILY AS NEEDED. 09/06/15   Arlis Porta., MD    Allergies as of 11/21/2015 - Review Complete 11/21/2015  Allergen Reaction Noted  . Augmentin [amoxicillin-pot clavulanate] Itching 01/18/2014    Family History  Problem Relation Age of Onset  . Cancer Mother 10    uterian and ovarian   . Goiter Mother   . Stroke Father   . COPD Sister   . Stroke Brother   . Heart murmur Sister   .  Social History   Social History  . Marital Status: Single    Spouse Name: N/A  . Number of Children: N/A  . Years of Education: N/A   Occupational History  . Not on file.   Social History Main Topics  . Smoking status: Former Smoker -- 9 years    Quit date: 07/10/2013  . Smokeless tobacco: Not on file  . Alcohol Use: No  . Drug Use: No  . Sexual Activity: Not on file   Other Topics Concern  . Not on file   Social History Narrative    Review of Systems: See HPI, otherwise negative ROS  Physical Exam: BP 184/75 mmHg  Pulse 79  Temp(Src) 98 F (36.7 C)  Resp 21  Ht 5\' 5"  (1.651 m)  Wt 270 lb (122.471 kg)  BMI 44.93 kg/m2  SpO2 94% General:   Alert,   pleasant and cooperative in NAD Head:  Normocephalic and atraumatic. Neck:  Supple; no masses or thyromegaly. Lungs:  Clear throughout to auscultation.    Heart:  Regular rate and rhythm. Abdomen:  Soft, nontender and nondistended. Normal bowel sounds, without guarding, and without rebound.   Neurologic:  Alert and  oriented x4;  grossly normal neurologically.  Impression/Plan: Martha Woodard is now here to undergo a screening colonoscopy.  Risks, benefits, and alternatives regarding colonoscopy have been reviewed with the patient.  Questions have been answered.  All parties agreeable.

## 2016-01-01 NOTE — Telephone Encounter (Signed)
Pt is requesting Dr Bary Castilla. Call pt @ [phone]. Thank you!

## 2016-01-01 NOTE — Op Note (Signed)
[facility] Patient Name: Martha Woodard Procedure Date: 01/01/2016 11:12 AM MRN: [id number] Account #: [id number] Date of Birth: 1966/02/08 Admit Type: Outpatient Age: 50 Room: [facility] ENDO ROOM 4 Gender: Female Note Status: Finalized Procedure:            Colonoscopy Indications:          Screening for colorectal malignant neoplasm Providers:            Lucilla Lame, MD Referring MD:         Barnie Del. Lacinda Axon (Referring MD) Medicines:            Propofol per Anesthesia Complications:        No immediate complications. Procedure:            Pre-Anesthesia Assessment:                       - Prior to the procedure, a History and Physical was                        performed, and patient medications and allergies were                        reviewed. The patient's tolerance of previous                        anesthesia was also reviewed. The risks and benefits of                        the procedure and the sedation options and risks were                        discussed with the patient. All questions were                        answered, and informed consent was obtained. Prior                        Anticoagulants: The patient has taken no previous                        anticoagulant or antiplatelet agents. ASA Grade                        Assessment: II - A patient with mild systemic disease.                        After reviewing the risks and benefits, the patient was                        deemed in satisfactory condition to undergo the                        procedure.                       After obtaining informed consent, the colonoscope was                        passed under direct vision. Throughout the procedure,  the patient's blood pressure, pulse, and oxygen                        saturations were monitored continuously. The Olympus                        CF-H180AL colonoscope ( S#: [id number] ) was introduced               through the anus and advanced to the the cecum,                        identified by appendiceal orifice and ileocecal valve.                        The colonoscopy was performed without difficulty. The                        patient tolerated the procedure well. The quality of                        the bowel preparation was good. Findings:      The perianal and digital rectal examinations were normal.      A 8 mm polyp was found in the sigmoid colon. The polyp was sessile. The       polyp was removed with a cold snare. Resection and retrieval were       complete.      Many small-mouthed diverticula were found in the entire colon.      Non-bleeding internal hemorrhoids were found during retroflexion. The       hemorrhoids were Grade II (internal hemorrhoids that prolapse but reduce       spontaneously). Impression:           - One 8 mm polyp in the sigmoid colon, removed with a                        cold snare. Resected and retrieved.                       - Diverticulosis in the entire examined colon.                       - Non-bleeding internal hemorrhoids. Recommendation:       - Await pathology results.                       - Repeat colonoscopy in 5 years if polyp adenoma and 10                        years if hyperplastic Procedure Code(s):    --- Professional ---                       [phone], Colonoscopy, flexible; with removal of tumor(s),                        polyp(s), or other lesion(s) by snare technique Diagnosis Code(s):    --- Professional ---                       Z12.11, Encounter for screening for malignant neoplasm  of colon                       D12.5, Benign neoplasm of sigmoid colon CPT copyright 2016 American Medical Association. All rights reserved. The codes documented in this report are preliminary and upon coder review may  be revised to meet current compliance requirements. Lucilla Lame, MD 01/01/2016 11:36:22 AM This  report has been signed electronically. Number of Addenda: 0 Note Initiated On: 01/01/2016 11:12 AM Scope Withdrawal Time: 0 hours 6 minutes 30 seconds  Total Procedure Duration: 0 hours 16 minutes 36 seconds       [facility]

## 2016-01-01 NOTE — Telephone Encounter (Signed)
Dr. Lacinda Axon please put in the referral for me when you can so I can get her referred to Dr. Bary Castilla at [place]. Thanks! MT

## 2016-01-01 NOTE — Telephone Encounter (Signed)
Referral placed.

## 2016-01-01 NOTE — Telephone Encounter (Signed)
Spoke with the patient.  She was given a list of people at [place] when she had her mammogram done, but she is wanting to have the biopsy done locally.  Are you ladies able to assist with this?

## 2016-01-01 NOTE — Anesthesia Preprocedure Evaluation (Signed)
Anesthesia Evaluation  Patient identified by MRN, date of birth, ID band Patient awake    Reviewed: Allergy & Precautions, H&P , NPO status , Patient's Chart, lab work & pertinent test results, reviewed documented beta blocker date and time   History of Anesthesia Complications Negative for: history of anesthetic complications  Airway Mallampati: III  TM Distance: >3 FB Neck ROM: full    Dental no notable dental hx. (+) Caps, Missing, Poor Dentition   Pulmonary shortness of breath, with exertion and at rest, asthma , neg sleep apnea, COPD,  COPD inhaler and oxygen dependent, neg recent URI, former smoker,    Pulmonary exam normal breath sounds clear to auscultation       Cardiovascular Exercise Tolerance: Good hypertension, (-) angina+CHF  (-) CAD, (-) Past MI, (-) Cardiac Stents and (-) CABG Normal cardiovascular exam+ dysrhythmias (-) Valvular Problems/Murmurs Rhythm:regular Rate:Normal     Neuro/Psych negative neurological ROS  negative psych ROS   GI/Hepatic Neg liver ROS, GERD  ,  Endo/Other  diabetesMorbid obesity  Renal/GU negative Renal ROS  negative genitourinary   Musculoskeletal   Abdominal   Peds  Hematology negative hematology ROS (+)   Anesthesia Other Findings Past Medical History:   Diabetes mellitus without complication (HCC)                 Hypertension                                                 COPD (chronic obstructive pulmonary disease) (*              Multiple lung nodules on CT                                  HLD (hyperlipidemia)                                         Diastolic heart failure (HCC)                                Morbid obesity with BMI of 45.0-49.9, adult (H*              CHF (congestive heart failure) (HCC)                         Shortness of breath dyspnea                                  Asthma                                                       Reproductive/Obstetrics negative OB ROS                             Anesthesia Physical Anesthesia Plan  ASA: IV  Anesthesia Plan: General  Post-op Pain Management:    Induction:   Airway Management Planned:   Additional Equipment:   Intra-op Plan:   Post-operative Plan:   Informed Consent: I have reviewed the patients History and Physical, chart, labs and discussed the procedure including the risks, benefits and alternatives for the proposed anesthesia with the patient or authorized representative who has indicated his/her understanding and acceptance.   Dental Advisory Given  Plan Discussed with: Anesthesiologist, CRNA and Surgeon  Anesthesia Plan Comments:         Anesthesia Quick Evaluation

## 2016-01-01 NOTE — Anesthesia Postprocedure Evaluation (Signed)
Anesthesia Post Note  Patient: Martha Woodard  Procedure(s) Performed: Procedure(s) (LRB): COLONOSCOPY WITH PROPOFOL (N/A)  Patient location during evaluation: Endoscopy Anesthesia Type: General Level of consciousness: awake and alert Pain management: pain level controlled Vital Signs Assessment: post-procedure vital signs reviewed and stable Respiratory status: spontaneous breathing, nonlabored ventilation, respiratory function stable and patient connected to nasal cannula oxygen Cardiovascular status: blood pressure returned to baseline and stable Postop Assessment: no signs of nausea or vomiting Anesthetic complications: no    Last Vitals:  Filed Vitals:   01/01/16 1200 01/01/16 1210  BP: 130/73 130/75  Pulse: 75 70  Temp:    Resp: 15 20    Last Pain: There were no vitals filed for this visit.               Martha Clan

## 2016-01-01 NOTE — Transfer of Care (Signed)
Immediate Anesthesia Transfer of Care Note  Patient: Martha Woodard  Procedure(s) Performed: Procedure(s): COLONOSCOPY WITH PROPOFOL (N/A)  Patient Location: PACU  Anesthesia Type:General  Level of Consciousness: awake  Airway & Oxygen Therapy: Patient Spontanous Breathing and Patient connected to nasal cannula oxygen  Post-op Assessment: Report given to RN and Post -op Vital signs reviewed and stable  Post vital signs: Reviewed and stable  Last Vitals:  Filed Vitals:   01/01/16 1140 01/01/16 1141  BP: 106/58 106/58  Pulse: 76 78  Temp:    Resp: 11 16    Complications: No apparent anesthesia complications

## 2016-01-02 ENCOUNTER — Encounter: Payer: Self-pay | Admitting: Gastroenterology

## 2016-01-02 LAB — SURGICAL PATHOLOGY

## 2016-01-03 ENCOUNTER — Other Ambulatory Visit: Payer: Self-pay | Admitting: Family Medicine

## 2016-01-03 ENCOUNTER — Encounter: Payer: Self-pay | Admitting: General Surgery

## 2016-01-03 ENCOUNTER — Encounter: Payer: Self-pay | Admitting: Gastroenterology

## 2016-01-03 ENCOUNTER — Other Ambulatory Visit: Payer: Self-pay

## 2016-01-03 ENCOUNTER — Ambulatory Visit (INDEPENDENT_AMBULATORY_CARE_PROVIDER_SITE_OTHER): Payer: BLUE CROSS/BLUE SHIELD | Admitting: General Surgery

## 2016-01-03 VITALS — BP 138/70 | HR 78 | Resp 16 | Ht 66.0 in | Wt 265.0 lb

## 2016-01-03 DIAGNOSIS — N6320 Unspecified lump in the left breast, unspecified quadrant: Secondary | ICD-10-CM

## 2016-01-03 DIAGNOSIS — N63 Unspecified lump in breast: Secondary | ICD-10-CM | POA: Diagnosis not present

## 2016-01-03 NOTE — Progress Notes (Addendum)
Patient ID: Martha Woodard, female   DOB: 07/15/1966, 50 y.o.   MRN: [id number]  Chief Complaint  Patient presents with  . Breast Problem    HPI Martha Woodard is a 50 y.o. female.  who presents for a breast evaluation. The most recent mammogram and ultrasound was done on 12-28-15.  Patient does perform regular self breast checks and gets regular mammograms done.   She states she can not feel anything different in the breast. Denies any breast injury or trauma. No family history of breast cancer. She was here In 2015 for aspiration of a thyroid cyst. Martha Woodard asymptomatic.   She is making use of oxygen with activity, she has stopped using it at night.  HPI  Past Medical History  Diagnosis Date  . Diabetes mellitus without complication ([place])   . Hypertension   . COPD (chronic obstructive pulmonary disease) ([place])   . Multiple lung nodules on CT   . HLD (hyperlipidemia)   . Diastolic heart failure ([place])   . Morbid obesity with BMI of 45.0-49.9, adult ([place])   . CHF (congestive heart failure) ([place])   . Shortness of breath dyspnea   . Asthma   . Colon polyp 01-01-2016    Past Surgical History  Procedure Laterality Date  . Wisdom tooth extraction    . Biopsy thyroid Right 02-06-14    NEGATIVE FOR MALIGNANT CELLS proteinaceous material and macrophages.  . Thryoid fna Right April 2015    Proteinaceous material and macrophages.  . Colonoscopy with propofol N/A 01/01/2016    Procedure: COLONOSCOPY WITH PROPOFOL;  Surgeon: Lucilla Lame, MD;  Location: [facility] ENDOSCOPY;  Service: Endoscopy;  Laterality: N/A;    Family History  Problem Relation Age of Onset  . Cancer Mother 13    uterian and ovarian   . Goiter Mother   . Stroke Father   . COPD Sister   . Stroke Brother   . Heart murmur Sister     Social History Social History  Substance Use Topics  . Smoking status: Former Smoker -- 58 years    Quit date: 07/10/2013  . Smokeless tobacco: Never Used  . Alcohol Use: No    Allergies   Allergen Reactions  . Augmentin [Amoxicillin-Pot Clavulanate] Itching    Current Outpatient Prescriptions  Medication Sig Dispense Refill  . albuterol (PROVENTIL) (2.5 MG/3ML) 0.083% nebulizer solution Take 3 mLs (2.5 mg total) by nebulization every 6 (six) hours as needed for wheezing or shortness of breath. 75 mL 12  . atorvastatin (LIPITOR) 10 MG tablet Take 1 tablet (10 mg total) by mouth daily. 30 tablet 12  . fluticasone (FLONASE) 50 MCG/ACT nasal spray Place 1 spray into both nostrils daily.     . furosemide (LASIX) 20 MG tablet Take 1 tablet (20 mg total) by mouth daily as needed. 30 tablet 6  . hydrochlorothiazide (HYDRODIURIL) 12.5 MG tablet Take 1 tablet (12.5 mg total) by mouth daily. 30 tablet 6  . losartan (COZAAR) 25 MG tablet Take 1 tablet (25 mg total) by mouth 2 (two) times daily. 60 tablet 5  . metFORMIN (GLUCOPHAGE) 500 MG tablet Take 2 tablets (1,000 mg total) by mouth 2 (two) times daily with a meal. 120 tablet 3  . metoprolol succinate (TOPROL-XL) 25 MG 24 hr tablet Take 1 tablet (25 mg total) by mouth daily. 30 tablet 12  . mometasone-formoterol (DULERA) 100-5 MCG/ACT AERO Inhale 2 puffs into the lungs 2 (two) times daily. 1 Inhaler 5  . Multiple Vitamin (MULTIVITAMIN)  tablet Take 1 tablet by mouth daily.    . OXYGEN Inhale 2 L into the lungs daily. 2 liters at night, 1 liter when doing activities, and no oxygen when at home during the day.    . tiotropium (SPIRIVA) 18 MCG inhalation capsule Place 1 capsule (18 mcg total) into inhaler and inhale daily. 30 capsule 12  . VENTOLIN HFA 108 (90 BASE) MCG/ACT inhaler INHALE 2 PUFFS BY MOUTH 4 TIMES DAILY AS NEEDED. 18 g 2   No current facility-administered medications for this visit.    Review of Systems Review of Systems  Constitutional: Negative.   Respiratory: Negative.   Cardiovascular: Negative.     Blood pressure 138/70, pulse 78, resp. rate 16, height 5\' 6"  (1.676 m), weight 265 lb (120.203 kg).  Physical  Exam Physical Exam  Constitutional: She is oriented to person, place, and time. She appears well-developed and well-nourished.  HENT:  Mouth/Throat: Oropharynx is clear and moist.  Eyes: Conjunctivae are normal. No scleral icterus.  Neck: Neck supple.  Cardiovascular: Normal rate, regular rhythm and normal heart sounds.   Pulmonary/Chest: Effort normal and breath sounds normal. Right breast exhibits no inverted nipple, no mass, no nipple discharge, no skin change and no tenderness. Left breast exhibits no inverted nipple, no mass, no nipple discharge, no skin change and no tenderness.  Lymphadenopathy:    She has no cervical adenopathy.    She has no axillary adenopathy.  Neurological: She is alert and oriented to person, place, and time.  Skin: Skin is warm and dry.  Psychiatric: Her behavior is normal.    Data Reviewed Bilateral mammogram and left breast ultrasound dated 12/28/2015 was reviewed. Density in the upper outer quadrant left breast proximal leak 5 mm in diameter. Ultrasound suggested a Amplex cyst or solid lesion for which biopsy was recommended. BI-RADS-4.  Ultrasound examination of the left breast in the 12:00 position, 8 cm from the nipple showed a well demarcated smoothly marginated 0.41 x 0.5 x 0.6 cm heterogeneous lesion. The patient was amenable to FNA sampling. This was completed using 1 mL of 1% plain Xylocaine. Multiple passes through the lesion were undertaken without discomfort. Slides 2 were prepared for cytologic review. BI-RADS-3.   Assessment    Left breast mass, low suspicion for malignancy.    Plan    The patient will be contacted when cytology is available.    Encouraged to make use of her oxygen at night to minimize the risk of pulmonary hypertension.Marland Kitchen   PCP/Ref:  Thersa Salt  This information has been scribed by Karie Fetch RN, BSN,BC.   Robert Bellow 01/07/2016, 8:54 PM

## 2016-01-03 NOTE — Patient Instructions (Signed)
The patient is aware to call back for any questions or concerns.  

## 2016-01-08 ENCOUNTER — Ambulatory Visit: Payer: Self-pay

## 2016-01-08 ENCOUNTER — Encounter: Payer: Self-pay | Admitting: General Surgery

## 2016-01-08 ENCOUNTER — Ambulatory Visit (INDEPENDENT_AMBULATORY_CARE_PROVIDER_SITE_OTHER): Payer: BLUE CROSS/BLUE SHIELD | Admitting: General Surgery

## 2016-01-08 VITALS — BP 158/78 | HR 80 | Resp 14 | Ht 66.0 in | Wt 265.0 lb

## 2016-01-08 DIAGNOSIS — N6320 Unspecified lump in the left breast, unspecified quadrant: Secondary | ICD-10-CM

## 2016-01-08 DIAGNOSIS — N63 Unspecified lump in breast: Secondary | ICD-10-CM | POA: Diagnosis not present

## 2016-01-08 NOTE — Progress Notes (Signed)
Patient ID: MARIALYCE DELHOMME, female   DOB: [birth year], 50 y.o.   MRN: [id number]  Chief Complaint  Patient presents with  . Procedure    HPI KALAN RANG is a 50 y.o. female. Here today for left breast biopsy. FNA cytology completed for a left breast nodule last week showed atypical cells. Formal excisional vacuum biopsy recommended and accepted.  The patient reports she is using her oxygen at night as previously instructed by her pulmonary physician   HPI  Past Medical History  Diagnosis Date  . Diabetes mellitus without complication ([place])   . Hypertension   . COPD (chronic obstructive pulmonary disease) ([place])   . Multiple lung nodules on CT   . HLD (hyperlipidemia)   . Diastolic heart failure ([place])   . Morbid obesity with BMI of 45.0-49.9, adult ([place])   . CHF (congestive heart failure) ([place])   . Shortness of breath dyspnea   . Asthma   . Colon polyp 01-01-2016    Past Surgical History  Procedure Laterality Date  . Wisdom tooth extraction    . Biopsy thyroid Right 02-06-14    NEGATIVE FOR MALIGNANT CELLS proteinaceous material and macrophages.  . Thryoid fna Right April 2015    Proteinaceous material and macrophages.  . Colonoscopy with propofol N/A 01/01/2016    Procedure: COLONOSCOPY WITH PROPOFOL;  Surgeon: Lucilla Lame, MD;  Location: [facility] ENDOSCOPY;  Service: Endoscopy;  Laterality: N/A;    Family History  Problem Relation Age of Onset  . Cancer Mother 39    uterian and ovarian   . Goiter Mother   . Stroke Father   . COPD Sister   . Stroke Brother   . Heart murmur Sister     Social History Social History  Substance Use Topics  . Smoking status: Former Smoker -- 25 years    Quit date: 07/10/2013  . Smokeless tobacco: Never Used  . Alcohol Use: No    Allergies  Allergen Reactions  . Augmentin [Amoxicillin-Pot Clavulanate] Itching    Current Outpatient Prescriptions  Medication Sig Dispense Refill  . albuterol (PROVENTIL) (2.5 MG/3ML) 0.083% nebulizer  solution Take 3 mLs (2.5 mg total) by nebulization every 6 (six) hours as needed for wheezing or shortness of breath. 75 mL 12  . atorvastatin (LIPITOR) 10 MG tablet Take 1 tablet (10 mg total) by mouth daily. 30 tablet 12  . fluticasone (FLONASE) 50 MCG/ACT nasal spray Place 1 spray into both nostrils daily.     . furosemide (LASIX) 20 MG tablet Take 1 tablet (20 mg total) by mouth daily as needed. 30 tablet 6  . hydrochlorothiazide (HYDRODIURIL) 12.5 MG tablet Take 1 tablet (12.5 mg total) by mouth daily. 30 tablet 6  . losartan (COZAAR) 25 MG tablet Take 1 tablet (25 mg total) by mouth 2 (two) times daily. 60 tablet 5  . metFORMIN (GLUCOPHAGE) 500 MG tablet Take 2 tablets (1,000 mg total) by mouth 2 (two) times daily with a meal. 120 tablet 3  . metoprolol succinate (TOPROL-XL) 25 MG 24 hr tablet Take 1 tablet (25 mg total) by mouth daily. 30 tablet 12  . mometasone-formoterol (DULERA) 100-5 MCG/ACT AERO Inhale 2 puffs into the lungs 2 (two) times daily. 1 Inhaler 5  . Multiple Vitamin (MULTIVITAMIN) tablet Take 1 tablet by mouth daily.    . OXYGEN Inhale 2 L into the lungs daily. 2 liters at night, 1 liter when doing activities, and no oxygen when at home during the day.    Marland Kitchen  tiotropium (SPIRIVA) 18 MCG inhalation capsule Place 1 capsule (18 mcg total) into inhaler and inhale daily. 30 capsule 12  . VENTOLIN HFA 108 (90 BASE) MCG/ACT inhaler INHALE 2 PUFFS BY MOUTH 4 TIMES DAILY AS NEEDED. 18 g 2   No current facility-administered medications for this visit.    Review of Systems Review of Systems  Constitutional: Negative.   Respiratory: Negative.   Cardiovascular: Negative.     Blood pressure 158/78, pulse 80, resp. rate 14, height 5\' 6"  (1.676 m), weight 265 lb (120.203 kg).  Physical Exam Physical Exam  Pulmonary/Chest:      Data Reviewed   Left breast mammogram and ultrasound dated 12/28/2015 completed at [facility] showed a persistent round mass in the upper-outer  quadrant of left breast measuring 0.5 cm. Targeted ultrasound showed a 0.4 x 0.5 cm mass in the 12:30 o'clock position of the left breast. It may represent a complicated cyst. Ultrasound-guided core biopsy recommended. BI-RADS-4. 01/03/2016 FNA:  Adequacy Reason Satisfactory For Evaluation. Diagnosis ATYPICAL DUCTAL EPITHELIAL CELLS. SEE COMMENT. Claudette Laws MD Pathologist, Electronic Signature (Case signed 01/04/2016) Source Fine Needle Aspiration, LEFT BREAST 12 OCLOCK   Comment Comment: The differential includes fibrocystic changes and a proliferative process. Follow up is suggested.  Ultrasound examination of the left breast in the 12:00 position, 8 cm from the nipple shows a slightly lobulated 0.42 x 0.54 x 0.55 cm hypoechoic mass with faint acoustic shadowing. BI-RADS-4.   A total of 10 mL of 0.5% Xylocaine with 0.25% Marcaine with 1-200,000 epinephrine was utilized well tolerated. A lateral approach was utilized. An 11 blade was used to incise the skin followed by introduction of a 10-gauge Encor biopsy device set to 11 mm sample depth. Multiple core samples were completed with complete removal of the lesion. A postbiopsy clip was placed. No bleeding evident. Skin defect closed with benzoin, Steri-Strips followed by Telfa and Tegaderm dressing. Postbiopsy instructions provided. Based on the mammogram and ultrasound review, and in light of the recent FNA report, this lesion on ultrasound was felt to be [place] and with the mammographic abnormality.  Assessment    ADH on FNA, vacuum biopsy well tolerated.     Plan    Postbiopsy instructions were reviewed. The patient will be contacted when biopsy results are available.     PCP:  Thersa Salt  This information has been scribed by Gaspar Cola CMA.   Robert Bellow 01/08/2016, 8:33 PM

## 2016-01-08 NOTE — Patient Instructions (Signed)

## 2016-01-09 ENCOUNTER — Telehealth: Payer: Self-pay | Admitting: *Deleted

## 2016-01-09 NOTE — Telephone Encounter (Signed)
Notified patient of pathology results. Patient verbalized understanding and doing well.

## 2016-01-14 ENCOUNTER — Ambulatory Visit: Payer: No Typology Code available for payment source | Admitting: Family Medicine

## 2016-01-15 ENCOUNTER — Encounter: Payer: Self-pay | Admitting: Family Medicine

## 2016-01-15 ENCOUNTER — Telehealth: Payer: Self-pay | Admitting: *Deleted

## 2016-01-15 NOTE — Telephone Encounter (Signed)
Post breast biopsy, she states she is doing well. Bruising is resolving. The patient is aware to call back for any questions or concerns.

## 2016-01-15 NOTE — Telephone Encounter (Signed)
-----   Message from Carson Myrtle, RN sent at 01/08/2016  9:48 AM EDT ----- Post biopsy

## 2016-01-21 ENCOUNTER — Ambulatory Visit (INDEPENDENT_AMBULATORY_CARE_PROVIDER_SITE_OTHER): Payer: BLUE CROSS/BLUE SHIELD | Admitting: *Deleted

## 2016-01-21 DIAGNOSIS — R0902 Hypoxemia: Secondary | ICD-10-CM

## 2016-01-21 DIAGNOSIS — J438 Other emphysema: Secondary | ICD-10-CM | POA: Diagnosis not present

## 2016-01-21 LAB — PULMONARY FUNCTION TEST
DL/VA % PRED: 100 %
DL/VA: 5.06 ml/min/mmHg/L
DLCO UNC % PRED: 133 %
DLCO unc: 36 ml/min/mmHg
FEF 25-75 PRE: 0.73 L/s
FEF 25-75 Post: 0.96 L/sec
FEF2575-%Change-Post: 31 %
FEF2575-%PRED-PRE: 25 %
FEF2575-%Pred-Post: 33 %
FEV1-%Change-Post: 6 %
FEV1-%Pred-Post: 54 %
FEV1-%Pred-Pre: 50 %
FEV1-PRE: 1.51 L
FEV1-Post: 1.61 L
FEV1FVC-%Change-Post: 1 %
FEV1FVC-%Pred-Pre: 77 %
FEV6-%CHANGE-POST: 5 %
FEV6-%PRED-PRE: 65 %
FEV6-%Pred-Post: 69 %
FEV6-Post: 2.54 L
FEV6-Pre: 2.41 L
FEV6FVC-%Change-Post: 0 %
FEV6FVC-%PRED-POST: 102 %
FEV6FVC-%PRED-PRE: 101 %
FVC-%Change-Post: 4 %
FVC-%PRED-PRE: 64 %
FVC-%Pred-Post: 68 %
FVC-POST: 2.56 L
FVC-Pre: 2.44 L
POST FEV6/FVC RATIO: 99 %
PRE FEV1/FVC RATIO: 62 %
Post FEV1/FVC ratio: 63 %
Pre FEV6/FVC Ratio: 99 %

## 2016-01-21 NOTE — Progress Notes (Signed)
PFT performed today with nitrogen washout. 

## 2016-01-22 ENCOUNTER — Ambulatory Visit
Admission: RE | Admit: 2016-01-22 | Discharge: 2016-01-22 | Disposition: A | Discharge status: Home / Self Care | Payer: BLUE CROSS/BLUE SHIELD | Source: Ambulatory Visit | Attending: Internal Medicine | Admitting: Internal Medicine

## 2016-01-22 DIAGNOSIS — R911 Solitary pulmonary nodule: Secondary | ICD-10-CM | POA: Diagnosis not present

## 2016-01-31 ENCOUNTER — Other Ambulatory Visit: Payer: Self-pay | Admitting: Family Medicine

## 2016-01-31 ENCOUNTER — Telehealth: Payer: Self-pay | Admitting: Family Medicine

## 2016-02-13 ENCOUNTER — Encounter: Payer: Self-pay | Admitting: Family Medicine

## 2016-02-13 ENCOUNTER — Other Ambulatory Visit (HOSPITAL_COMMUNITY)
Admission: RE | Admit: 2016-02-13 | Discharge: 2016-02-13 | Disposition: A | Discharge status: Home / Self Care | Payer: BLUE CROSS/BLUE SHIELD | Source: Ambulatory Visit | Attending: Family Medicine | Admitting: Family Medicine

## 2016-02-13 ENCOUNTER — Ambulatory Visit (INDEPENDENT_AMBULATORY_CARE_PROVIDER_SITE_OTHER): Payer: BLUE CROSS/BLUE SHIELD | Admitting: Family Medicine

## 2016-02-13 VITALS — BP 126/70 | HR 88 | Temp 98.6°F | Ht 66.0 in | Wt 261.4 lb

## 2016-02-13 DIAGNOSIS — Z01419 Encounter for gynecological examination (general) (routine) without abnormal findings: Secondary | ICD-10-CM | POA: Diagnosis not present

## 2016-02-13 DIAGNOSIS — Z1151 Encounter for screening for human papillomavirus (HPV): Secondary | ICD-10-CM | POA: Insufficient documentation

## 2016-02-13 DIAGNOSIS — Z0189 Encounter for other specified special examinations: Secondary | ICD-10-CM | POA: Insufficient documentation

## 2016-02-13 DIAGNOSIS — E1159 Type 2 diabetes mellitus with other circulatory complications: Secondary | ICD-10-CM

## 2016-02-13 LAB — HEMOGLOBIN A1C: Hgb A1c MFr Bld: 5.5 % (ref 4.6–6.5)

## 2016-02-13 NOTE — Assessment & Plan Note (Signed)
Pap smear today. Mammogram up-to-date. Colonoscopy up-to-date. Immunizations up-to-date. Remainder of preventative healthcare up-to-date excluding eye exam which she is in need of. I encouraged her to get this.

## 2016-02-13 NOTE — Progress Notes (Signed)
Subjective:  Patient ID: Martha Woodard, female    DOB: 01/01/1966  Age: 50 y.o. MRN: [id number]  CC: Pap smear   HPI Martha Woodard is a 50 y.o. female presents to the clinic today for an annual exam/pap smear.  Preventative Healthcare  Pap smear: In need of. Will perform today.  Mammogram: Up to date.   Colonoscopy: Up to date.   Immunizations  Tetanus - Up to date.  Pneumococcal - Up to date.  Flu - Up to date.  Zoster - N/A.  Hepatitis C screening - Done.  Alcohol use: No.  Smoking/tobacco use: Former smoker.  PMH, Surgical Hx, Family Hx, Social History reviewed and updated as below.  Past Medical History  Diagnosis Date  . Diabetes mellitus without complication ([place])   . Hypertension   . COPD (chronic obstructive pulmonary disease) ([place])   . Multiple lung nodules on CT   . HLD (hyperlipidemia)   . Diastolic heart failure ([place])   . Morbid obesity with BMI of 45.0-49.9, adult ([place])   . CHF (congestive heart failure) ([place])   . Shortness of breath dyspnea   . Colon polyp 01-01-2016   Past Surgical History  Procedure Laterality Date  . Wisdom tooth extraction    . Biopsy thyroid Right 02-06-14    NEGATIVE FOR MALIGNANT CELLS proteinaceous material and macrophages.  . Thryoid fna Right April 2015    Proteinaceous material and macrophages.  . Colonoscopy with propofol N/A 01/01/2016    Procedure: COLONOSCOPY WITH PROPOFOL;  Surgeon: Lucilla Lame, MD;  Location: [facility] ENDOSCOPY;  Service: Endoscopy;  Laterality: N/A;   Family History  Problem Relation Age of Onset  . Cancer Mother 60    uterian and ovarian   . Goiter Mother   . Stroke Father   . COPD Sister   . Stroke Brother   . Heart murmur Sister    Social History  Substance Use Topics  . Smoking status: Former Smoker -- 81 years    Quit date: 07/10/2013  . Smokeless tobacco: Never Used  . Alcohol Use: No   Review of Systems  Respiratory: Positive for shortness of breath.        SOB = stable.     All other systems reviewed and are negative.  Objective:   Today's Vitals: BP 126/70 mmHg  Pulse 88  Temp(Src) 98.6 F (37 C)  Ht 5\' 6"  (1.676 m)  Wt 261 lb 6.4 oz (118.57 kg)  BMI 42.21 kg/m2  SpO2 94%  Physical Exam  Constitutional: She is oriented to person, place, and time. She appears well-developed. No distress.  [place] O2 in place.  HENT:  Head: Normocephalic.  Eyes: Conjunctivae are normal. No scleral icterus.  Neck: Normal range of motion.  Cardiovascular: Normal rate and regular rhythm.   1-2 + pitting LE edema.  Pulmonary/Chest: Effort normal and breath sounds normal.  Abdominal: She exhibits no distension.  Genitourinary:  Pelvic Exam: External: mass noted on left labia (chronic). Vagina: normal without lesions or masse        Cervix: normal without lesions or masses Pap smear: performed  Musculoskeletal: Normal range of motion.  Neurological: She is alert and oriented to person, place, and time.  Skin: Skin is warm and dry.  Psychiatric: She has a normal mood and affect.  Vitals reviewed.   Assessment & Plan:   Problem List Items Addressed This Visit    Well woman exam with routine gynecological exam - Primary    Pap  smear today. Mammogram up-to-date. Colonoscopy up-to-date. Immunizations up-to-date. Remainder of preventative healthcare up-to-date excluding eye exam which she is in need of. I encouraged her to get this.      DM type 2 (diabetes mellitus, type 2) (HCC)   Relevant Orders   HgB A1c      Outpatient Encounter Prescriptions as of 02/13/2016  Medication Sig  . albuterol (PROVENTIL) (2.5 MG/3ML) 0.083% nebulizer solution Take 3 mLs (2.5 mg total) by nebulization every 6 (six) hours as needed for wheezing or shortness of breath.  Marland Kitchen atorvastatin (LIPITOR) 10 MG tablet Take 1 tablet (10 mg total) by mouth daily.  . fluticasone (FLONASE) 50 MCG/ACT nasal spray USE 1 OR 2 SPRAYS IN EACH NOSTRIL ONCE DAILY.  . furosemide (LASIX) 20 MG tablet  Take 1 tablet (20 mg total) by mouth daily as needed.  . hydrochlorothiazide (MICROZIDE) 12.5 MG capsule TAKE 1 CAPSULE BY MOUTH ONCE DAILY.  Marland Kitchen losartan (COZAAR) 25 MG tablet Take 1 tablet (25 mg total) by mouth 2 (two) times daily.  . metFORMIN (GLUCOPHAGE) 500 MG tablet Take 2 tablets (1,000 mg total) by mouth 2 (two) times daily with a meal.  . metoprolol succinate (TOPROL-XL) 25 MG 24 hr tablet Take 1 tablet (25 mg total) by mouth daily.  . mometasone-formoterol (DULERA) 100-5 MCG/ACT AERO Inhale 2 puffs into the lungs 2 (two) times daily.  . Multiple Vitamin (MULTIVITAMIN) tablet Take 1 tablet by mouth daily.  . OXYGEN Inhale 2 L into the lungs daily. 2 liters at night, 1 liter when doing activities, and no oxygen when at home during the day.  . tiotropium (SPIRIVA) 18 MCG inhalation capsule Place 1 capsule (18 mcg total) into inhaler and inhale daily.  . VENTOLIN HFA 108 (90 BASE) MCG/ACT inhaler INHALE 2 PUFFS BY MOUTH 4 TIMES DAILY AS NEEDED.   No facility-administered encounter medications on file as of 02/13/2016.    Follow-up: 6 months.  [place]

## 2016-02-13 NOTE — Addendum Note (Signed)
Addended by: Karlene Einstein D on: 02/13/2016 02:07 PM   Modules accepted: Orders

## 2016-02-13 NOTE — Patient Instructions (Signed)
Continue your current medications.  Use compression hose for your swelling.  We will call with your pap smear results and your A1C results.  Follow up in 6 months.  Take care  Dr. Lacinda Axon

## 2016-02-14 ENCOUNTER — Encounter: Payer: Self-pay | Admitting: Internal Medicine

## 2016-02-14 ENCOUNTER — Ambulatory Visit (INDEPENDENT_AMBULATORY_CARE_PROVIDER_SITE_OTHER): Payer: BLUE CROSS/BLUE SHIELD | Admitting: Internal Medicine

## 2016-02-14 VITALS — BP 130/66 | HR 80 | Ht 66.0 in | Wt 262.0 lb

## 2016-02-14 DIAGNOSIS — E662 Morbid (severe) obesity with alveolar hypoventilation: Secondary | ICD-10-CM

## 2016-02-14 DIAGNOSIS — J438 Other emphysema: Secondary | ICD-10-CM

## 2016-02-14 NOTE — Progress Notes (Signed)
* [facility]     Assessment and Plan:  Severe COPD. -Continue dulera, spiriva, albuterol. Her COPD appears stable at this time and not in exacerbation.   Morbid obesity with Obesity hypoventilation syndrome. -Weight loss and increased physical activity would be beneficial. -Chronic hypercapnic respiratory failure with elevated PCO2. -She is intolerant to BiPAP. We discussed that we could revisit this, should she have recurrent admissions due to COPD/hypoventilation. -Given that she does not tolerate Pap. We'll hold off on sleep study for now --Restrictive lung disease likely due to obesity.   Chronic volume overload. -We'll monitor, this is likely contributing to her dyspnea, but appears stable at this time..  Lung nodules.. -Have been stable for 2 years, no need for continued surveillance.     Date: 02/14/2016  MRN# [id number] Martha Woodard [birth year]   Martha Woodard is a 50 y.o. old female seen in follow up for chief complaint of  Chief Complaint  Patient presents with  . Follow-up     HPI:   The patient is a 50 year old female with a history of severe COPD, on Dulera, Spiriva,  albuterol. She also has a history of obesity, hypoventilation syndrome, but is intolerant of Pap. Last visit it was noted that she had enlarging pulmonary nodules, she was therefore sent for repeat CT of the chest in 3 months. Reviewed the CT chest images performed on 01/22/2016: Minimal change to the bilateral pulmonary nodules, dating back to May of 2015. There is bilateral emphysematous changes, as well as some mid zone bronchiectasis.  She notes that her breathing has been doing well, she has been using spiriva once daily and dulera twice daily. She got a puppy recently and has been more active because. She is currently using 2L of oxygen at night and 1L during day.    Previous CT chest 09/27/2015 : This showed no evidence of pulmonary emboli, however, it showed some  small  lung nodules on the right lung, as well as diffuse air trapping.  Echocardiogram 09/28/2015: EF is 60-65%, left atrium was mildly dilated. Right ventricular systolic function was normal, pulmonary artery systolic pressure was reported as within normal limits.  Ambulatory oxygen checked today, beginning O2 sat was 91% and HR 83. After walking 600 feet sat was 85% and HR 109. Oxygen replaced at 1L and sat was 91%.   Pulmonary function test. April 24th 2017: Spirometry: FVC was 3.76 L, 64% of predicted, FEV1 was 2.97 L which was 50% of predicted, there was no significant improvement with bronchodilator therapy. The FEV to FVC ratio was 62%, suggestive of obstruction. Lung volumes: Total lung capacity was 74% of predicted, RV/TLC ratio is 120%, consistent with air trapping. Diffusion: Diffusion capacity was 133% of predicted. Interpretation: Moderate short-term lung disease, on the cusp of becoming severe obstructive lung disease, with no reversibility, mild restriction due to obesity, air trapping due to emphysema.  Medication:   Outpatient Encounter Prescriptions as of 02/14/2016  Medication Sig  . albuterol (PROVENTIL) (2.5 MG/3ML) 0.083% nebulizer solution Take 3 mLs (2.5 mg total) by nebulization every 6 (six) hours as needed for wheezing or shortness of breath.  Marland Kitchen atorvastatin (LIPITOR) 10 MG tablet Take 1 tablet (10 mg total) by mouth daily.  . fluticasone (FLONASE) 50 MCG/ACT nasal spray USE 1 OR 2 SPRAYS IN EACH NOSTRIL ONCE DAILY.  . furosemide (LASIX) 20 MG tablet Take 1 tablet (20 mg total) by mouth daily as needed.  . hydrochlorothiazide (MICROZIDE) 12.5 MG capsule TAKE  1 CAPSULE BY MOUTH ONCE DAILY.  Marland Kitchen losartan (COZAAR) 25 MG tablet Take 1 tablet (25 mg total) by mouth 2 (two) times daily.  . metFORMIN (GLUCOPHAGE) 500 MG tablet Take 2 tablets (1,000 mg total) by mouth 2 (two) times daily with a meal.  . metoprolol succinate (TOPROL-XL) 25 MG 24 hr tablet Take 1 tablet (25 mg  total) by mouth daily.  . mometasone-formoterol (DULERA) 100-5 MCG/ACT AERO Inhale 2 puffs into the lungs 2 (two) times daily.  . Multiple Vitamin (MULTIVITAMIN) tablet Take 1 tablet by mouth daily.  . OXYGEN Inhale 2 L into the lungs daily. 2 liters at night, 1 liter when doing activities, and no oxygen when at home during the day.  . tiotropium (SPIRIVA) 18 MCG inhalation capsule Place 1 capsule (18 mcg total) into inhaler and inhale daily.  . VENTOLIN HFA 108 (90 BASE) MCG/ACT inhaler INHALE 2 PUFFS BY MOUTH 4 TIMES DAILY AS NEEDED.   No facility-administered encounter medications on file as of 02/14/2016.     Allergies:  Augmentin  Review of Systems: Gen:  Denies  fever, sweats. HEENT: Denies blurred vision. Cvc:  No dizziness, chest pain or heaviness Resp:   Denies cough or sputum porduction. Gi: Denies swallowing difficulty, stomach pain.  Gu:  Denies bladder incontinence, burning urine Ext:   No Joint pain, stiffness. Skin: No skin rash, easy bruising. Endoc:  No polyuria, polydipsia. Psych: No depression, insomnia. Other:  All other systems were reviewed and found to be negative other than what is mentioned in the HPI.   Physical Examination:   VS: BP 130/66 mmHg  Pulse 80  Ht 5\' 6"  (1.676 m)  Wt 262 lb (118.842 kg)  BMI 42.31 kg/m2  SpO2 91%  General Appearance: No distress  Neuro:without focal findings,  speech normal,  HEENT: PERRLA, EOM intact. Pulmonary: normal breath sounds, No wheezing.   CardiovascularNormal S1,S2.  No m/r/g.   Abdomen: Benign, Soft, non-tender. Renal:  No costovertebral tenderness  GU:  Not performed at this time. Endoc: No evident thyromegaly, no signs of acromegaly. Skin:   warm, no rash. Extremities: normal, no cyanosis, clubbing.   LABORATORY PANEL:   CBC No results for input(s): WBC, HGB, HCT, PLT in the last 168  hours. ------------------------------------------------------------------------------------------------------------------  Chemistries  No results for input(s): NA, K, CL, CO2, GLUCOSE, BUN, CREATININE, CALCIUM, MG, AST, ALT, ALKPHOS, BILITOT in the last 168 hours.  Invalid input(s): GFRCGP ------------------------------------------------------------------------------------------------------------------  Cardiac Enzymes No results for input(s): TROPONINI in the last 168 hours. ------------------------------------------------------------  RADIOLOGY:   No results found for this or any previous visit. Results for orders placed during the hospital encounter of 09/25/15  DG Chest 2 View   Narrative CLINICAL DATA:  Severe shortness of Breath.  EXAM: CHEST  2 VIEW  COMPARISON:  09/25/2015.  FINDINGS: Bibasilar atelectasis noted. The lungs are clear wiithout focal pneumonia, edema, pneumothorax or pleural effusion. Cardiopericardial silhouette is at upper limits of normal for size. The visualized bony structures of the thorax are intact. Telemetry leads overlie the chest.  IMPRESSION: Borderline cardiomegaly with basilar atelectasis.   Electronically Signed   By: Misty Stanley M.D.   On: 09/30/2015 14:24    ------------------------------------------------------------------------------------------------------------------  Thank  you for allowing [facility] to assist in the care of your patient. Our recommendations are noted above.  Please contact us if we can be of further service.   Marda Stalker, MD.  [facility] Office Number: [phone]  Patricia Pesa, M.D.  Vilinda Boehringer, M.D.  Merton Border, M.D

## 2016-02-14 NOTE — Patient Instructions (Signed)
Continue using dulera and spiriva.   Continue with increased physical activity.

## 2016-02-15 LAB — CYTOLOGY - PAP

## 2016-02-28 ENCOUNTER — Other Ambulatory Visit: Payer: Self-pay | Admitting: Family Medicine

## 2016-03-18 ENCOUNTER — Other Ambulatory Visit: Payer: Self-pay | Admitting: Family Medicine

## 2016-03-20 ENCOUNTER — Telehealth: Payer: Self-pay | Admitting: *Deleted

## 2016-03-20 MED ORDER — LOSARTAN POTASSIUM 25 MG PO TABS: 25.0000 mg | ORAL_TABLET | Freq: Two times a day (BID) | ORAL | Status: DC

## 2016-03-20 NOTE — Telephone Encounter (Signed)
Refilled medication

## 2016-03-20 NOTE — Telephone Encounter (Signed)
Patient has requested a medication refill for losartan Pharmacy [facility]

## 2016-03-21 NOTE — Telephone Encounter (Signed)
PA initiated via Cover My Meds for BID dosing. Pt was increased to BID after a ER visit in December by Dr. Larene Beach (PCP at the time).

## 2016-05-20 ENCOUNTER — Telehealth: Payer: Self-pay | Admitting: *Deleted

## 2016-05-20 MED ORDER — MOMETASONE FURO-FORMOTEROL FUM 100-5 MCG/ACT IN AERO: 2.0000 | INHALATION_SPRAY | Freq: Two times a day (BID) | RESPIRATORY_TRACT | 5 refills | Status: DC

## 2016-05-20 NOTE — Telephone Encounter (Signed)
Medication has been refilled.

## 2016-05-20 NOTE — Telephone Encounter (Signed)
[facility] has requested a medication refill for [facility]

## 2016-07-19 ENCOUNTER — Other Ambulatory Visit: Payer: Self-pay | Admitting: Family Medicine

## 2016-07-20 ENCOUNTER — Other Ambulatory Visit: Payer: Self-pay | Admitting: Family Medicine

## 2016-07-20 DIAGNOSIS — I491 Atrial premature depolarization: Secondary | ICD-10-CM

## 2016-07-21 ENCOUNTER — Telehealth: Payer: Self-pay | Admitting: *Deleted

## 2016-07-21 ENCOUNTER — Other Ambulatory Visit: Payer: Self-pay | Admitting: Family Medicine

## 2016-07-21 DIAGNOSIS — I491 Atrial premature depolarization: Secondary | ICD-10-CM

## 2016-07-21 MED ORDER — ATORVASTATIN CALCIUM 10 MG PO TABS: 10.0000 mg | ORAL_TABLET | Freq: Every day | ORAL | 3 refills | Status: DC

## 2016-07-21 MED ORDER — METOPROLOL SUCCINATE ER 25 MG PO TB24: 25.0000 mg | ORAL_TABLET | Freq: Every day | ORAL | 3 refills | Status: DC

## 2016-07-21 NOTE — Telephone Encounter (Signed)
refilled 

## 2016-07-21 NOTE — Telephone Encounter (Signed)
Patient has requested a Rx refill for metoprolol and atorvastatin  Pharmacy [facility]

## 2016-08-13 ENCOUNTER — Encounter: Payer: Self-pay | Admitting: Family Medicine

## 2016-08-13 ENCOUNTER — Ambulatory Visit (INDEPENDENT_AMBULATORY_CARE_PROVIDER_SITE_OTHER): Payer: BLUE CROSS/BLUE SHIELD | Admitting: Family Medicine

## 2016-08-13 VITALS — BP 122/76 | HR 85 | Temp 98.3°F | Resp 16 | Wt 263.5 lb

## 2016-08-13 DIAGNOSIS — E785 Hyperlipidemia, unspecified: Secondary | ICD-10-CM | POA: Diagnosis not present

## 2016-08-13 DIAGNOSIS — E1159 Type 2 diabetes mellitus with other circulatory complications: Secondary | ICD-10-CM | POA: Diagnosis not present

## 2016-08-13 DIAGNOSIS — I10 Essential (primary) hypertension: Secondary | ICD-10-CM

## 2016-08-13 DIAGNOSIS — L304 Erythema intertrigo: Secondary | ICD-10-CM | POA: Insufficient documentation

## 2016-08-13 DIAGNOSIS — J432 Centrilobular emphysema: Secondary | ICD-10-CM | POA: Diagnosis not present

## 2016-08-13 MED ORDER — FLUCONAZOLE 100 MG PO TABS: 100.0000 mg | ORAL_TABLET | Freq: Every day | ORAL | 0 refills | Status: DC

## 2016-08-13 NOTE — Progress Notes (Signed)
Subjective:  Patient ID: Martha Woodard, female    DOB: 13-Feb-1966  Age: 50 y.o. MRN: [id number]  CC: Follow up  HPI:  50 year old female with a complicated past medical history including COPD on O2, obesity hypoventilation, hypertension, hyperlipidemia, DM 2 presents for follow-up.  HTN  Well controlled on losartan, HCTZ, metoprolol.  HLD  Well controlled on Lipitor 10 mg (last LDL was 71).  COPD  Patient endorses that she's had worsening cough for the past 3 months.  She's had a few episodes of hemoptysis as well.  She's had a CT scan earlier this year which was unremarkable.  No reports of increasing shortness of breath.  No fevers or chills.  Cough is intermittently productive.  No known exacerbating or relieving factors.  She endorses compliance with her dulera and Spiriva.  DM-2  At goal on Metformin.  Intertrigo  Patient states that she's been battling intertrigo recently.  Particularly under the breasts and on the abdominal pannus.  She tries keep the area dry but has difficulty doing so.  She reports some cracking as well as erythema.  She states that she has difficulty using powders as it makes it pasty.  No other associated symptoms. No other complaints at this time.  Social Hx   Social History   Social History  . Marital status: Single    Spouse name: N/A  . Number of children: N/A  . Years of education: N/A   Social History Main Topics  . Smoking status: Former Smoker    Years: 33.00    Quit date: 07/10/2013  . Smokeless tobacco: Never Used  . Alcohol use No  . Drug use: No  . Sexual activity: Not Asked   Other Topics Concern  . None   Social History Narrative  . None    Review of Systems  Constitutional: Negative.   Respiratory: Positive for cough and shortness of breath.        Hemoptysis.  Skin: Positive for rash.   Objective:  BP 122/76 (BP Location: Right Arm, Patient Position: Sitting, Cuff Size: Large)    Pulse 85   Temp 98.3 F (36.8 C) (Oral)   Resp 16   Wt 263 lb 8 oz (119.5 kg)   SpO2 94%   BMI 42.53 kg/m   BP/Weight 08/13/2016 02/14/2016 0000000  Systolic BP 123XX123 [id number]  Diastolic BP 76 66 70  Wt. (Lbs) 263.5 262 261.4  BMI 42.53 42.31 42.21   Physical Exam  Constitutional: She is oriented to person, place, and time. She appears well-developed. No distress.  Cardiovascular: Normal rate and regular rhythm.   Pulmonary/Chest: Effort normal.  No adventitious breath sounds appreciated.  Neurological: She is alert and oriented to person, place, and time.  Skin:  Severe erythema and cracking under the breasts and under pannus.  Psychiatric: She has a normal mood and affect.  Vitals reviewed.  Lab Results  Component Value Date   WBC 7.5 09/30/2015   HGB 14.6 09/30/2015   HCT 47.5 (H) 09/30/2015   PLT 225 09/30/2015   GLUCOSE 108 (H) 09/30/2015   CHOL 133 11/13/2015   TRIG 119.0 11/13/2015   HDL 41.60 11/13/2015   LDLDIRECT 71.0 11/13/2015   LDLCALC 67 11/13/2015   ALT 26 06/11/2015   AST 32 06/11/2015   NA 139 09/30/2015   K 4.0 09/30/2015   CL 86 (L) 09/30/2015   CREATININE 0.60 09/30/2015   BUN 21 (H) 09/30/2015   CO2 45 (H) 09/30/2015  TSH 1.805 06/11/2015   HGBA1C 5.5 02/13/2016   Assessment & Plan:   Problem List Items Addressed This Visit    Intertrigo    New problem. Treating with Diflucan as patient wants to avoid topicals.      Hyperlipidemia    Well controlled. Continue Lipitor.      Essential hypertension - Primary    Well-controlled. Continue losartan, HCTZ, metoprolol.      DM type 2 (diabetes mellitus, type 2) ([place])    Well controlled. Continue metformin. Next Holding off on A1c at this time given good control.      COPD (chronic obstructive pulmonary disease) with emphysema ([place])    Patient endorsing worsening cough and a few episodes of hemoptysis. She's had imaging earlier this year. Continue dulera and Spiriva. I will discuss  with pulmonology.         Meds ordered this encounter  Medications  . fluconazole (DIFLUCAN) 100 MG tablet    Sig: Take 1 tablet (100 mg total) by mouth daily.    Dispense:  14 tablet    Refill:  0    Follow-up: Return in about 6 months (around 02/10/2017) for Follow up Chronic medical issues.  [place]

## 2016-08-13 NOTE — Patient Instructions (Signed)
Take the diflucan for the skin issue.  Continue your other meds.  I'll send your Pulmonologist a message.  Everything else looks good.  Follow up in 6 months.  Take care  Dr. Lacinda Axon

## 2016-08-13 NOTE — Assessment & Plan Note (Signed)
New problem. Treating with Diflucan as patient wants to avoid topicals.

## 2016-08-13 NOTE — Assessment & Plan Note (Signed)
Patient endorsing worsening cough and a few episodes of hemoptysis. She's had imaging earlier this year. Continue dulera and Spiriva. I will discuss with pulmonology.

## 2016-08-13 NOTE — Assessment & Plan Note (Signed)
Well controlled. Continue metformin. Next Holding off on A1c at this time given good control.

## 2016-08-13 NOTE — Progress Notes (Signed)
Pre visit review using our clinic review tool, if applicable. No additional management support is needed unless otherwise documented below in the visit note. 

## 2016-08-13 NOTE — Assessment & Plan Note (Signed)
Well-controlled.  Continue Lipitor. 

## 2016-08-13 NOTE — Assessment & Plan Note (Signed)
Well-controlled. Continue losartan, HCTZ, metoprolol.

## 2016-08-19 ENCOUNTER — Encounter: Payer: Self-pay | Admitting: Family Medicine

## 2016-08-19 ENCOUNTER — Ambulatory Visit (INDEPENDENT_AMBULATORY_CARE_PROVIDER_SITE_OTHER): Payer: BLUE CROSS/BLUE SHIELD | Admitting: Family Medicine

## 2016-08-19 VITALS — BP 144/76 | HR 84 | Temp 98.2°F | Resp 14 | Wt 263.2 lb

## 2016-08-19 DIAGNOSIS — Z23 Encounter for immunization: Secondary | ICD-10-CM | POA: Diagnosis not present

## 2016-08-19 DIAGNOSIS — L729 Follicular cyst of the skin and subcutaneous tissue, unspecified: Secondary | ICD-10-CM | POA: Insufficient documentation

## 2016-08-19 NOTE — Progress Notes (Signed)
Pre visit review using our clinic review tool, if applicable. No additional management support is needed unless otherwise documented below in the visit note. 

## 2016-08-19 NOTE — Assessment & Plan Note (Signed)
Procedure: Cyst removal (x2)  Area prepped and draped in sterile fashion.   Local anesthesia with 1% lidocaine with epi (2mL).  #11 scalpel was used to make 2 linear incisions (one for each cyst).  Blunt dissection and pressure removed cyst sacs and contents without difficulty.  Frontal Wound was closed with 3 simple interrupted sutures (3-0 Ethilon). Parietal wound closed with 2 simple interrupted sutures.   Patient tolerated the procedure well. No complications. Minimal blood loss.

## 2016-08-19 NOTE — Progress Notes (Signed)
   Subjective:  Patient ID: Martha Woodard, female    DOB: 09/02/1966  Age: 50 y.o. MRN: [id number]  CC: Cyst removal  HPI:  50 year old female with a complicated past medical history presents for removal of 2 cysts.  Patient has long-standing history of scalp cysts. They are painful and bothersome at times. She has a large one on the top of the head (frontal). She has another one that is quite bothersome in the parietal region she states that he recently ruptured and has drained some fluid. She has had other cysts removed previously without difficulty. She presents today to have them excised.  Social Hx   Social History   Social History  . Marital status: Single    Spouse name: N/A  . Number of children: N/A  . Years of education: N/A   Social History Main Topics  . Smoking status: Former Smoker    Years: 33.00    Quit date: 07/10/2013  . Smokeless tobacco: Never Used  . Alcohol use No  . Drug use: No  . Sexual activity: Not Asked   Other Topics Concern  . None   Social History Narrative  . None    Review of Systems  Constitutional: Negative.   Skin:       Scalp cysts.    Objective:  BP (!) 144/76 (BP Location: Left Arm, Patient Position: Sitting, Cuff Size: Large)   Pulse 84   Temp 98.2 F (36.8 C) (Oral)   Resp 14   Wt 263 lb 4 oz (119.4 kg)   SpO2 93%   BMI 42.49 kg/m   BP/Weight 08/19/2016 08/13/2016 [id number]  Systolic BP [id number]  Diastolic BP 76 76 66  Wt. (Lbs) 263.25 263.5 262  BMI 42.49 42.53 42.31    Physical Exam  Constitutional: She is oriented to person, place, and time. She appears well-developed. No distress.  Pulmonary/Chest: Effort normal.  Neurological: She is alert and oriented to person, place, and time.  Skin:  Scalp - large cyst in the frontal region (midline); 2.5 x 2 cm. Additional 2 cm cyst in the parietal region (posterior, left).  Psychiatric: She has a normal mood and affect.  Vitals reviewed.  Assessment & Plan:    Problem List Items Addressed This Visit    Scalp cyst    Procedure: Cyst removal (x2)  Area prepped and draped in sterile fashion.   Local anesthesia with 1% lidocaine with epi (17mL).  #11 scalpel was used to make 2 linear incisions (one for each cyst).  Blunt dissection and pressure removed cyst sacs and contents without difficulty.  Frontal Wound was closed with 3 simple interrupted sutures (3-0 Ethilon). Parietal wound closed with 2 simple interrupted sutures.   Patient tolerated the procedure well. No complications. Minimal blood loss.        Other Visit Diagnoses    Encounter for immunization       Relevant Orders   Flu Vaccine QUAD 36+ mos IM (Completed)     Follow-up: As scheduled  [place]

## 2016-08-26 ENCOUNTER — Ambulatory Visit
Admission: RE | Admit: 2016-08-26 | Discharge: 2016-08-26 | Disposition: A | Discharge status: Home / Self Care | Payer: BLUE CROSS/BLUE SHIELD | Source: Ambulatory Visit | Attending: Pulmonary Disease | Admitting: Pulmonary Disease

## 2016-08-26 ENCOUNTER — Encounter: Payer: Self-pay | Admitting: Pulmonary Disease

## 2016-08-26 ENCOUNTER — Other Ambulatory Visit (INDEPENDENT_AMBULATORY_CARE_PROVIDER_SITE_OTHER): Payer: BLUE CROSS/BLUE SHIELD

## 2016-08-26 ENCOUNTER — Ambulatory Visit: Payer: BLUE CROSS/BLUE SHIELD | Admitting: Surgical

## 2016-08-26 ENCOUNTER — Other Ambulatory Visit: Payer: Self-pay | Admitting: Family Medicine

## 2016-08-26 ENCOUNTER — Encounter: Payer: Self-pay | Admitting: Cardiovascular Disease

## 2016-08-26 ENCOUNTER — Ambulatory Visit (INDEPENDENT_AMBULATORY_CARE_PROVIDER_SITE_OTHER): Payer: BLUE CROSS/BLUE SHIELD | Admitting: Cardiovascular Disease

## 2016-08-26 ENCOUNTER — Ambulatory Visit (INDEPENDENT_AMBULATORY_CARE_PROVIDER_SITE_OTHER): Payer: BLUE CROSS/BLUE SHIELD | Admitting: Pulmonary Disease

## 2016-08-26 VITALS — BP 126/72 | HR 86 | Ht 64.0 in | Wt 263.8 lb

## 2016-08-26 VITALS — BP 126/72 | HR 86 | Ht 64.0 in | Wt 263.0 lb

## 2016-08-26 DIAGNOSIS — J449 Chronic obstructive pulmonary disease, unspecified: Secondary | ICD-10-CM

## 2016-08-26 DIAGNOSIS — Z6841 Body Mass Index (BMI) 40.0 and over, adult: Secondary | ICD-10-CM | POA: Diagnosis not present

## 2016-08-26 DIAGNOSIS — J9811 Atelectasis: Secondary | ICD-10-CM | POA: Diagnosis not present

## 2016-08-26 DIAGNOSIS — I5032 Chronic diastolic (congestive) heart failure: Secondary | ICD-10-CM | POA: Diagnosis not present

## 2016-08-26 DIAGNOSIS — E785 Hyperlipidemia, unspecified: Secondary | ICD-10-CM

## 2016-08-26 DIAGNOSIS — J432 Centrilobular emphysema: Secondary | ICD-10-CM

## 2016-08-26 DIAGNOSIS — I509 Heart failure, unspecified: Secondary | ICD-10-CM | POA: Diagnosis not present

## 2016-08-26 DIAGNOSIS — E1159 Type 2 diabetes mellitus with other circulatory complications: Secondary | ICD-10-CM | POA: Diagnosis not present

## 2016-08-26 DIAGNOSIS — E782 Mixed hyperlipidemia: Secondary | ICD-10-CM

## 2016-08-26 DIAGNOSIS — I10 Essential (primary) hypertension: Secondary | ICD-10-CM

## 2016-08-26 DIAGNOSIS — E662 Morbid (severe) obesity with alveolar hypoventilation: Secondary | ICD-10-CM

## 2016-08-26 DIAGNOSIS — J9611 Chronic respiratory failure with hypoxia: Secondary | ICD-10-CM

## 2016-08-26 DIAGNOSIS — R918 Other nonspecific abnormal finding of lung field: Secondary | ICD-10-CM | POA: Diagnosis not present

## 2016-08-26 DIAGNOSIS — Z4802 Encounter for removal of sutures: Secondary | ICD-10-CM

## 2016-08-26 LAB — COMPREHENSIVE METABOLIC PANEL
ALBUMIN: 4.2 g/dL (ref 3.5–5.2)
ALK PHOS: 59 U/L (ref 39–117)
ALT: 27 U/L (ref 0–35)
AST: 21 U/L (ref 0–37)
BILIRUBIN TOTAL: 0.2 mg/dL (ref 0.2–1.2)
BUN: 11 mg/dL (ref 6–23)
CALCIUM: 10.1 mg/dL (ref 8.4–10.5)
CO2: 37 meq/L — AB (ref 19–32)
CREATININE: 0.63 mg/dL (ref 0.40–1.20)
Chloride: 99 mEq/L (ref 96–112)
GFR: 105.49 mL/min (ref >60.00)
Glucose, Bld: 117 mg/dL — ABNORMAL HIGH (ref 70–99)
Potassium: 4.2 mEq/L (ref 3.5–5.1)
Sodium: 143 mEq/L (ref 135–145)
Total Protein: 7.3 g/dL (ref 6.0–8.3)

## 2016-08-26 LAB — LIPID PANEL
CHOL/HDL RATIO: 4
CHOLESTEROL: 158 mg/dL (ref 0–200)
HDL: 43.6 mg/dL (ref >39.00)
LDL Cholesterol: 79 mg/dL (ref 0–99)
NonHDL: 114.11
TRIGLYCERIDES: 176 mg/dL — AB (ref 0.0–149.0)
VLDL: 35.2 mg/dL (ref 0.0–40.0)

## 2016-08-26 LAB — HEMOGLOBIN A1C: Hgb A1c MFr Bld: 6.2 % (ref 4.6–6.5)

## 2016-08-26 NOTE — Patient Instructions (Signed)
Chest Xray today. We will call if there are any worrisome findings on it  Continue Dulera, Spiriva, oxygen and albuterol as needed

## 2016-08-26 NOTE — Progress Notes (Signed)
Care was provided under my supervision. I agree with the management as indicated in the note.  Rilley Poulter DO  

## 2016-08-26 NOTE — Progress Notes (Signed)
Cardiology Office Note  Date:  08/26/2016   ID:  Kanasha, Dessources [birth year], MRN [id number]  PCP:  Coral Spikes, DO   Chief Complaint  Patient presents with  . Other    6 month f/u c/o heart flutter. Meds reviewed verbally with pt.    HPI:  Ms. Hindman is a very pleasant 50 year old woman with a history of diabetes, asthma, COPD, lung nodules with recent presentation to the hospital for acute respiratory distress, 20 pound weight gain, hypoxia, diagnosed with COPD exacerbation and acute diastolic CHF with obstructive sleep apnea, obesity hypoventilation. She presents for follow-up in the clinic for chronic diastolic CHF  In follow-up today she reports that she is doing well Weight has been stable, denies worsening shortness of breath or leg swelling She is On one liter oxygen day walking around, 2 liters at night, none in the day Weight up 2 pounds from prior clinic visit Not using lasix, once every two months Takes HCTZ  Lab work reviewed HBA1C 5.5 , down from 7.5 Total chol 133, LDL 67  stopped smoking 06/2013  EKG on today's visit shows normal sinus rhythm with rate 86 bpm, no significant ST or T-wave changes  Other past medical history reviewed Following hospital discharge weight was initially 280 pounds, now down to 270 pounds Prior to admission to the hospital weight was 296 pounds  On presentation to the hospital, She was profoundly hypoxic with O2 sats in the 70s and was initiated on BiPAP mask.   labs revealed hypercapnia pCO2 level of 79, hypoxia, hyperglycemia.  Chest x-ray revealed cardiomegaly but no edema or consolidation.    CTA of the chest showed no pulmonary embolus. Bilateral lower lobe atelectasis, pulmonary edema versus interstitial pneumonitis superimposed on changes of COPD and right lung nodules which were unchanged.   She was initiated on steroids, antibiotics, inhalation therapy as well as diuretics.    hypoxic intermittently, especially  at nighttime and sitting in the bed, concerning for obesity hypoventilation syndrome.    PMH:   has a past medical history of CHF (congestive heart failure) ([place]); Colon polyp (01-01-2016); COPD (chronic obstructive pulmonary disease) ([place]); Diabetes mellitus without complication ([place]); Diastolic heart failure ([place]); HLD (hyperlipidemia); Hypertension; Morbid obesity with BMI of 45.0-49.9, adult ([place]); Multiple lung nodules on CT; and Shortness of breath dyspnea.  PSH:    Past Surgical History:  Procedure Laterality Date  . BIOPSY THYROID Right 02-06-14   NEGATIVE FOR MALIGNANT CELLS proteinaceous material and macrophages.  . COLONOSCOPY WITH PROPOFOL N/A 01/01/2016   Procedure: COLONOSCOPY WITH PROPOFOL;  Surgeon: Lucilla Lame, MD;  Location: [facility] ENDOSCOPY;  Service: Endoscopy;  Laterality: N/A;  . thryoid fna Right April 2015   Proteinaceous material and macrophages.  . WISDOM TOOTH EXTRACTION      Current Outpatient Prescriptions  Medication Sig Dispense Refill  . albuterol (PROVENTIL) (2.5 MG/3ML) 0.083% nebulizer solution Take 3 mLs (2.5 mg total) by nebulization every 6 (six) hours as needed for wheezing or shortness of breath. 75 mL 12  . atorvastatin (LIPITOR) 10 MG tablet Take 1 tablet (10 mg total) by mouth daily. 90 tablet 3  . fluconazole (DIFLUCAN) 100 MG tablet Take 1 tablet (100 mg total) by mouth daily. 14 tablet 0  . fluticasone (FLONASE) 50 MCG/ACT nasal spray USE 1 OR 2 SPRAYS IN EACH NOSTRIL ONCE DAILY. 16 g 12  . furosemide (LASIX) 20 MG tablet Take 1 tablet (20 mg total) by mouth daily as needed. 30 tablet 6  .  hydrochlorothiazide (MICROZIDE) 12.5 MG capsule TAKE 1 CAPSULE BY MOUTH ONCE DAILY 30 capsule 6  . losartan (COZAAR) 25 MG tablet Take 1 tablet (25 mg total) by mouth 2 (two) times daily. 60 tablet 5  . metFORMIN (GLUCOPHAGE) 500 MG tablet TAKE 2 TABLETS BY MOUTH TWICE DAILY WITHA MEAL 120 tablet 5  . metoprolol succinate (TOPROL-XL) 25 MG 24 hr tablet Take 1  tablet (25 mg total) by mouth daily. 90 tablet 3  . mometasone-formoterol (DULERA) 100-5 MCG/ACT AERO Inhale 2 puffs into the lungs 2 (two) times daily. 1 Inhaler 5  . Multiple Vitamin (MULTIVITAMIN) tablet Take 1 tablet by mouth daily.    . OXYGEN Inhale 2 L into the lungs daily. 2 liters at night, 1 liter when doing activities, and no oxygen when at home during the day.    . tiotropium (SPIRIVA) 18 MCG inhalation capsule Place 1 capsule (18 mcg total) into inhaler and inhale daily. 30 capsule 12  . VENTOLIN HFA 108 (90 BASE) MCG/ACT inhaler INHALE 2 PUFFS BY MOUTH 4 TIMES DAILY AS NEEDED. 18 g 2   No current facility-administered medications for this visit.      Allergies:   Augmentin [amoxicillin-pot clavulanate]   Social History:  The patient  reports that she quit smoking about 3 years ago. She quit after 33.00 years of use. She has never used smokeless tobacco. She reports that she does not drink alcohol or use drugs.   Family History:   family history includes COPD in her sister; Cancer (age of onset: 26) in her mother; Goiter in her mother; Heart murmur in her sister; Stroke in her brother and father.    Review of Systems: Review of Systems  Constitutional: Negative.   Respiratory: Negative.   Cardiovascular: Negative.   Gastrointestinal: Negative.   Musculoskeletal: Negative.   Neurological: Negative.   Psychiatric/Behavioral: Negative.   All other systems reviewed and are negative.    PHYSICAL EXAM: VS:  BP 126/72 (BP Location: Left Arm, Patient Position: Sitting, Cuff Size: Large)   Pulse 86   Ht 5\' 4"  (1.626 m)   Wt 263 lb 12 oz (119.6 kg)   BMI 45.27 kg/m  , BMI Body mass index is 45.27 kg/m. GEN: Well nourished, well developed, in no acute distress , obese HEENT: normal  Neck: no JVD, carotid bruits, or masses Cardiac: RRR; no murmurs, rubs, or gallops,no edema  Respiratory:  clear to auscultation bilaterally, normal work of breathing GI: soft, nontender,  nondistended, + BS MS: no deformity or atrophy  Skin: warm and dry, no rash Neuro:  Strength and sensation are intact Psych: euthymic mood, full affect    Recent Labs: 09/25/2015: B Natriuretic Peptide 251.0 09/30/2015: BUN 21; Creatinine, Ser 0.60; Hemoglobin 14.6; Platelets 225; Potassium 4.0; Sodium 139    Lipid Panel Lab Results  Component Value Date   CHOL 133 11/13/2015   HDL 41.60 11/13/2015   LDLCALC 67 11/13/2015   TRIG 119.0 11/13/2015      Wt Readings from Last 3 Encounters:  08/26/16 263 lb (119.3 kg)  08/26/16 263 lb 12 oz (119.6 kg)  08/19/16 263 lb 4 oz (119.4 kg)       ASSESSMENT AND PLAN:  Chronic diastolic heart failure ([place]) - Plan: EKG 12-Lead Weight is relatively stable, asymptomatic, no changes to her medications Recommended she take Lasix for any weight gain, ankle swelling  Essential hypertension - Plan: EKG 12-Lead Blood pressure is well controlled on today's visit. No changes made to the  medications.  Mixed hyperlipidemia Cholesterol is at goal on the current lipid regimen. No changes to the medications were made.  Type 2 diabetes mellitus with other circulatory complication, without long-term current use of insulin (HCC) Dramatic improvement in her A1c by watching her diet  Morbid obesity with BMI of 45.0-49.9, adult ([place]) We have encouraged continued exercise, careful diet management in an effort to lose weight.  Centrilobular emphysema ([place]) Requiring very small amounts of oxygen Overall stable Currently nonsmoker   Total encounter time more than 25 minutes  Greater than 50% was spent in counseling and coordination of care with the patient   Disposition:   F/U  12 months   Orders Placed This Encounter  Procedures  . EKG 12-Lead     Signed, Esmond Plants, M.D., Ph.D. 08/26/2016  [place], [place]

## 2016-08-26 NOTE — Progress Notes (Signed)
Patient came in for suture removal. I removed three sutures from the top of the scalp. Patient stated that the two from the back of the scalp fell out while taking a shower. Dr. Lacinda Axon evaluated the patient while in the room.

## 2016-08-26 NOTE — Patient Instructions (Addendum)
Medication Instructions:   No medication changes made  Labwork:  No new labs needed  Testing/Procedures:  Ask Pulmonary doc about pleural effusion on the right ?chest xray   I recommend watching educational videos on topics of interest to you at:       [email or web address].com  Enter code: HEARTCARE    Follow-Up: It was a pleasure seeing you in the office today. Please call us if you have new issues that need to be addressed before your next appt.  [phone]  Your physician wants you to follow-up in: 6 months.  You will receive a reminder letter in the mail two months in advance. If you don't receive a letter, please call our office to schedule the follow-up appointment.  If you need a refill on your cardiac medications before your next appointment, please call your pharmacy.

## 2016-08-27 DIAGNOSIS — Z4802 Encounter for removal of sutures: Secondary | ICD-10-CM | POA: Insufficient documentation

## 2016-08-27 LAB — CBC
HCT: 42.8 % (ref 36.0–46.0)
HEMOGLOBIN: 14.1 g/dL (ref 12.0–15.0)
MCHC: 33.1 g/dL (ref 30.0–36.0)
MCV: 85.1 fl (ref 78.0–100.0)
PLATELETS: 295 10*3/uL (ref 150.0–400.0)
RBC: 5.03 Mil/uL (ref 3.87–5.11)
RDW: 14.1 % (ref 11.5–15.5)
WBC: 8 10*3/uL (ref 4.0–10.5)

## 2016-08-27 NOTE — Progress Notes (Signed)
Can you please close this note. It will not allow me to do it.

## 2016-08-29 NOTE — Progress Notes (Signed)
PULMONARY OFFICE FOLLOW UP  PROBLEMS: Chronic hypoxic/hypercarbic respiratory failure Former smoker Moderate COPD OHS CHF  Multiple pulmonary nodules - stable > 2 yrs and deemed benign  INTERVAL HISTORY: Last seen by Dr Ashby Dawes 02/14/16. No major events since then  SUBJ: This is a routine follow up appointment. She has no new complaints. She remains severely limited by dyspnea and deconditioning. Her current COPD regimen is Dulera, Spiriva and PRN albuterol (which she rarely uses). Denies CP, fever, purulent sputum, LE edema and calf tenderness  She was seen earlier in day by Dr Rockey Situ   OBJ: Vitals:   08/26/16 0933 08/26/16 0934  BP:  126/72  Pulse:  86  SpO2:  92%  Weight: 263 lb (119.3 kg)   Height: 5\' 4"  (1.626 m)   1 lpm [place]  No over distress HEENT WNL No JVD noted BS moderately diminished, percussion hyperresonant throughout, no wheezes Reg, no M Obese, NABS, soft 1+ bilateral ankle edema   DATA: BMP Latest Ref Rng & Units 08/26/2016 09/30/2015 09/28/2015  Glucose 70 - 99 mg/dL 117(H) 108(H) -  BUN 6 - 23 mg/dL 11 21(H) -  Creatinine 0.40 - 1.20 mg/dL 0.63 0.60 0.54  BUN/Creat Ratio 9 - 23 - - -  Sodium 135 - 145 mEq/L 143 139 -  Potassium 3.5 - 5.1 mEq/L 4.2 4.0 -  Chloride 96 - 112 mEq/L 99 86(L) -  CO2 19 - 32 mEq/L 37(H) 45(H) -  Calcium 8.4 - 10.5 mg/dL 10.1 9.1 -   CBC Latest Ref Rng & Units 08/26/2016 09/30/2015 09/26/2015  WBC 4.0 - 10.5 K/uL 8.0 7.5 8.0  Hemoglobin 12.0 - 15.0 g/dL 14.1 14.6 14.0  Hematocrit 36.0 - 46.0 % 42.8 47.5(H) 44.9  Platelets 150.0 - 400.0 K/uL 295.0 225 265   CXR: Hyperinflated, NACPD  IMPRESSION: Chronic hypoxic/hypercarbic respiratory failure Former smoker Moderate COPD OHS CHF  Multiple pulmonary nodules - stable > 2 yrs and deemed benign  PLAN: 1) Continue Dulera and Spiriva Continue PRN albuterol Continue oxygen therapy - 1 lpm during day, 2 lpm [place] with sleep Follow up 6 months  Merton Border,  MD PCCM service Mobile [phone] Pager [phone] 08/29/2016

## 2016-08-30 DIAGNOSIS — J449 Chronic obstructive pulmonary disease, unspecified: Secondary | ICD-10-CM | POA: Diagnosis not present

## 2016-09-01 ENCOUNTER — Telehealth: Payer: Self-pay | Admitting: Pulmonary Disease

## 2016-09-01 ENCOUNTER — Telehealth: Payer: Self-pay | Admitting: Family Medicine

## 2016-09-01 ENCOUNTER — Other Ambulatory Visit: Payer: Self-pay | Admitting: Family Medicine

## 2016-09-01 MED ORDER — GLUCOSE BLOOD VI STRP: ORAL_STRIP | 12 refills | Status: DC

## 2016-09-01 NOTE — Telephone Encounter (Signed)
Pt called and needs an rx for Tru metric test strips. Please advise, thank you!  Call pt @ [place], [place].

## 2016-09-01 NOTE — Telephone Encounter (Signed)
Refilled and sent   

## 2016-09-01 NOTE — Telephone Encounter (Signed)
Pt calling stating she is on [place] complete  She is letting us know she is switching over from [facility] cross blue shield  [place] is no longer covered  Needs to have a PA done on this  Please advise     [phone] this is the number to call so we can do that

## 2016-09-02 NOTE — Telephone Encounter (Signed)
Initiated PA through [facility] for [employer]. Key: [id number]  Pt ID: [id number] Group ID: [id number]   Sent for review. Will await response.

## 2016-09-16 ENCOUNTER — Other Ambulatory Visit: Payer: Self-pay | Admitting: Family Medicine

## 2016-09-17 ENCOUNTER — Telehealth: Payer: Self-pay | Admitting: *Deleted

## 2016-09-17 IMAGING — CT CT ANGIO CHEST
2 of 6 series · 19 of 46 positions shown · IV contrast (APPLIED)
Comparison: Portable chest dated 09/25/2015 and chest CTA dated
06/11/2015. Chest CT at [REDACTED] [HOSPITAL] dated 02/17/2014.

CLINICAL DATA: Shortness of breath.  Productive cough.

EXAM:
CT ANGIOGRAPHY CHEST WITH CONTRAST
TECHNIQUE: Multidetector CT imaging of the chest was performed using the
standard protocol during bolus administration of intravenous
contrast. Multiplanar CT image reconstructions and MIPs were
obtained to evaluate the vascular anatomy.
CONTRAST:  100mL OMNIPAQUE IOHEXOL 350 MG/ML SOLN

[Series 6: pe thins 1.5 · axial · 0.68mm/px · z∈[-351,-83]mm · 16 of 251 slices shown]
[im 14/251  lung]
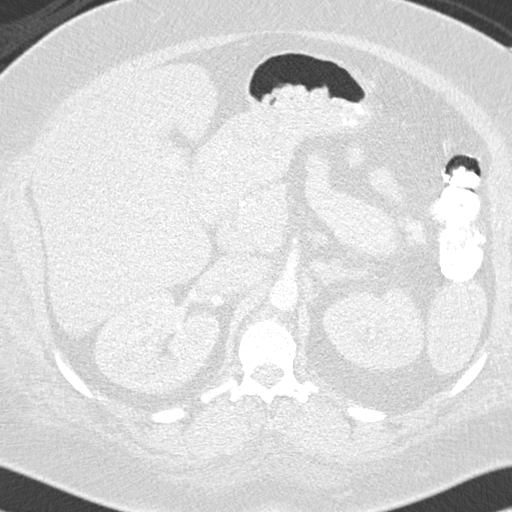
[im 27/251  soft-tissue]
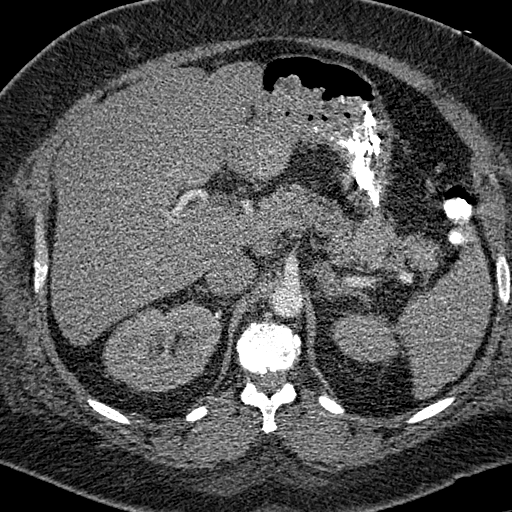
[im 40/251  lung]
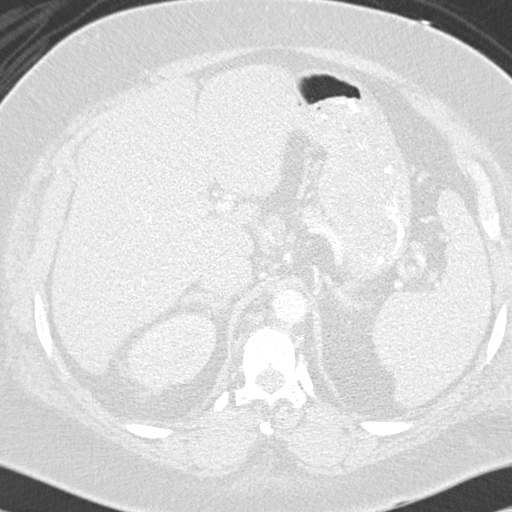
[im 53/251  soft-tissue]
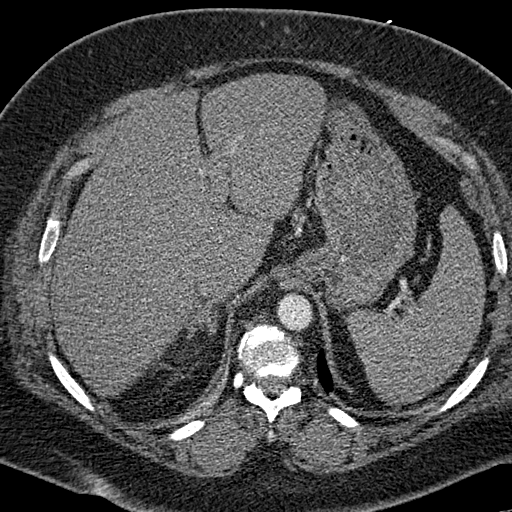
[im 79/251  lung]
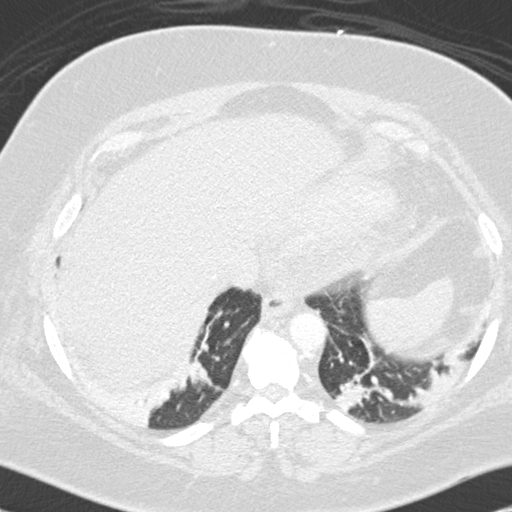
[im 93/251  soft-tissue]
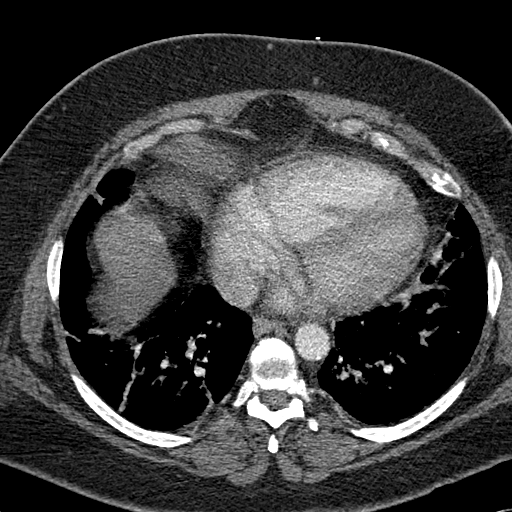
[im 106/251  lung]
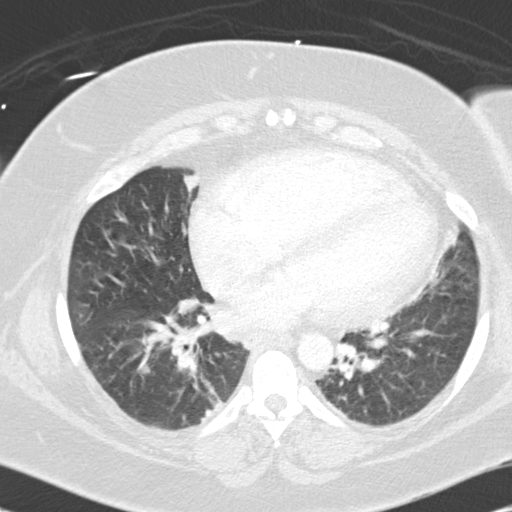
[im 119/251  soft-tissue]
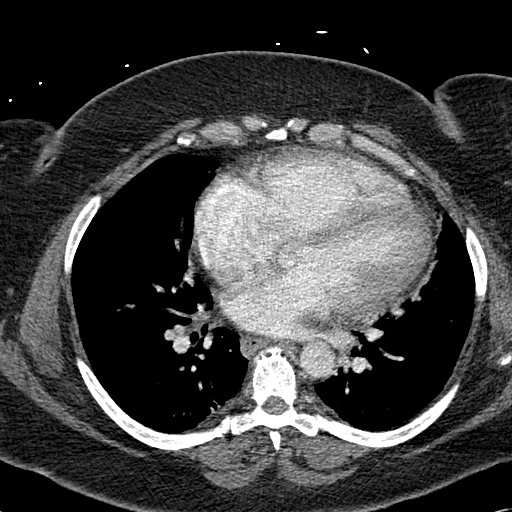
[im 132/251  lung]
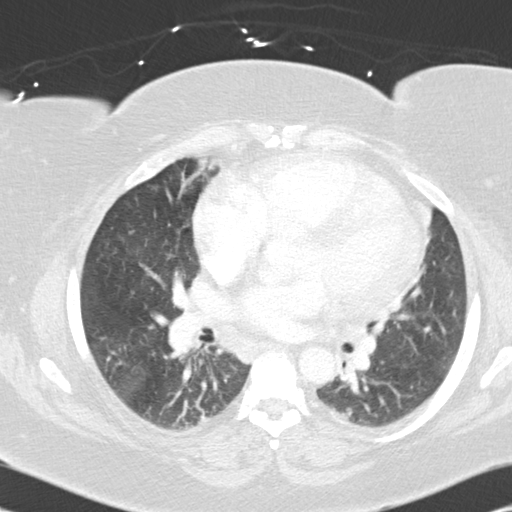
[im 145/251  soft-tissue]
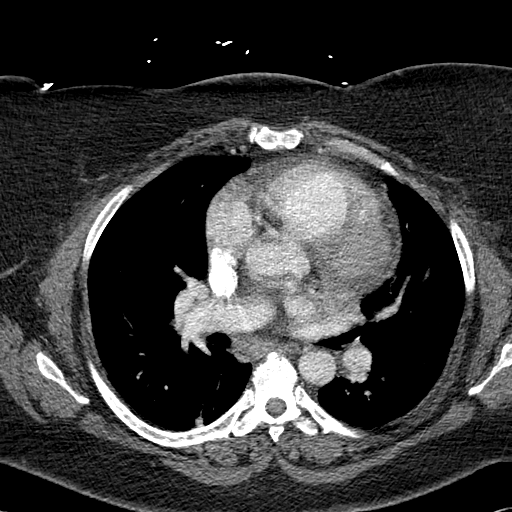
[im 158/251  lung]
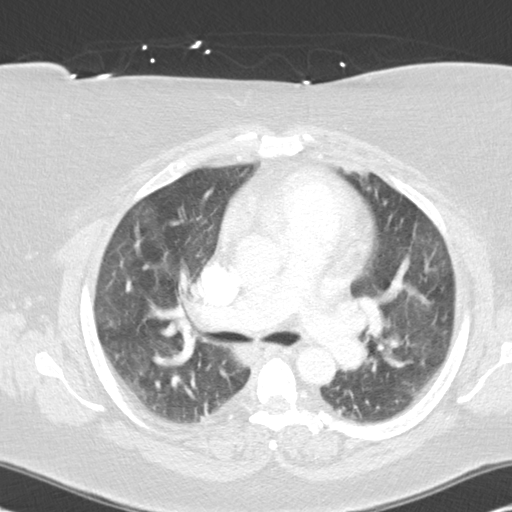
[im 172/251  soft-tissue]
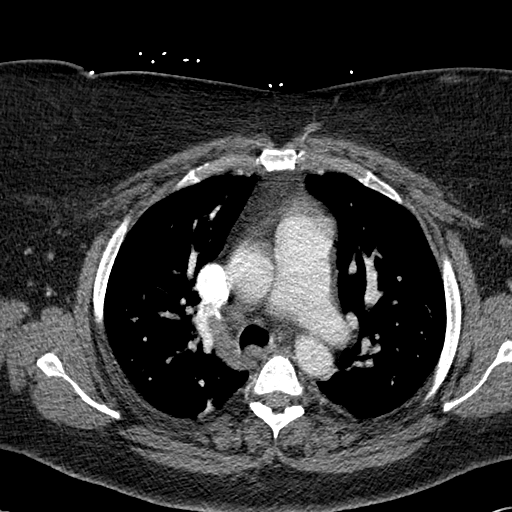
[im 198/251  lung]
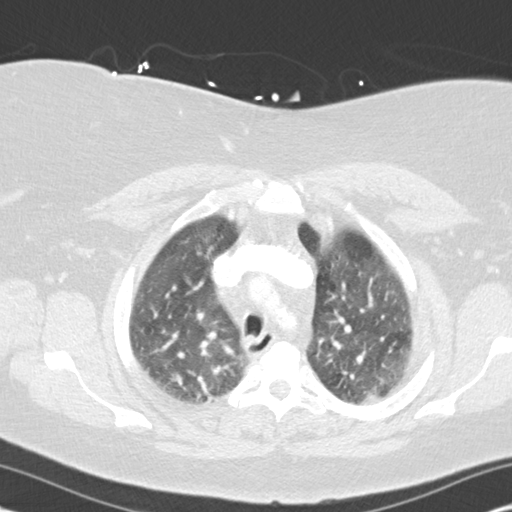
[im 211/251  soft-tissue]
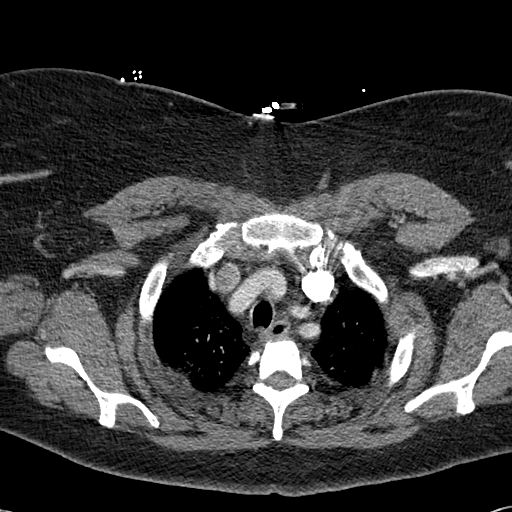
[im 224/251  lung]
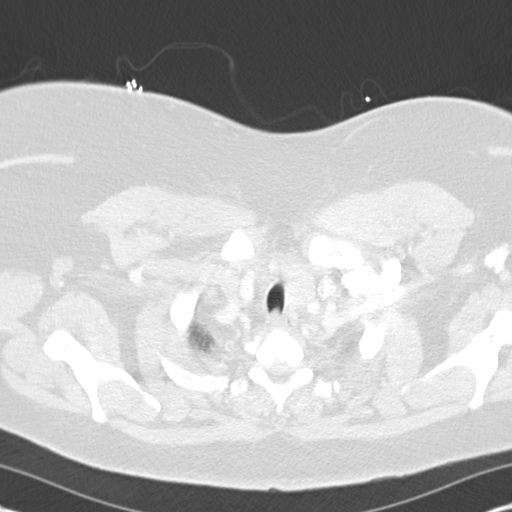
[im 237/251  soft-tissue]
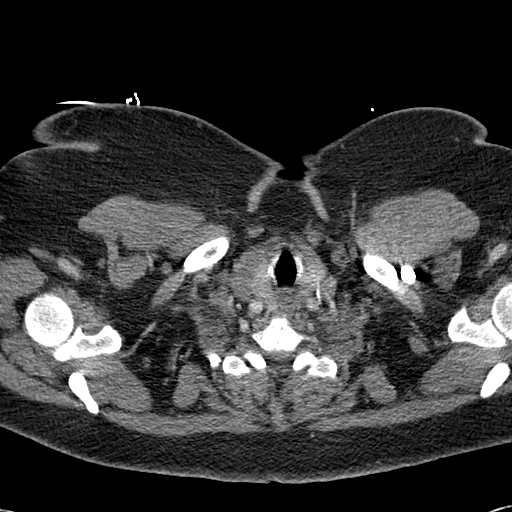

[Series 8: cor mpr 2.0 · coronal · 0.65mm/px · 3 of 146 slices shown]
[im 37/146  soft-tissue]
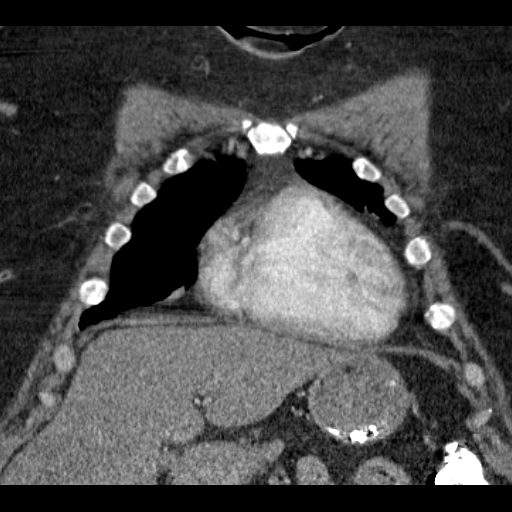
[im 73/146  soft-tissue]
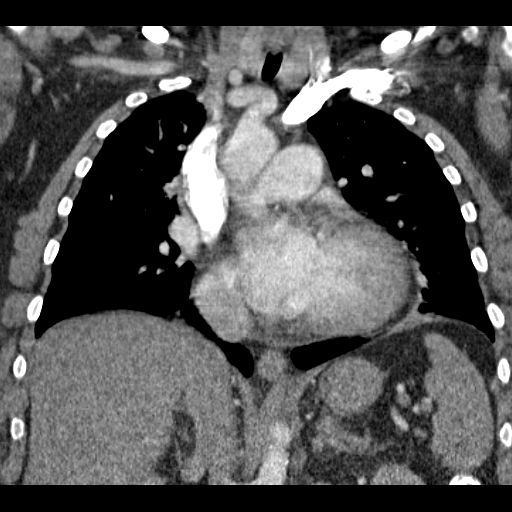
[im 109/146  soft-tissue]
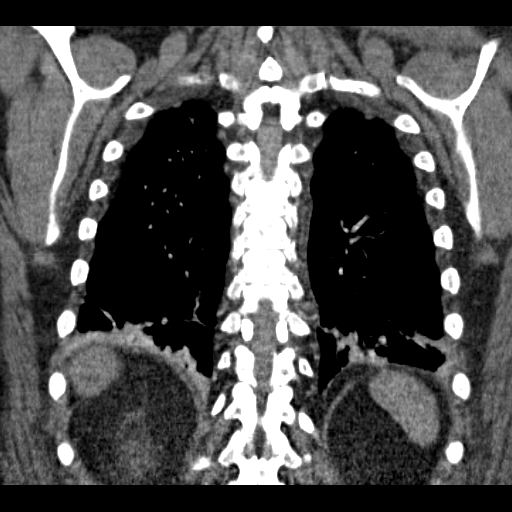

[19 of 46 positions shown; findings below may reference images not displayed]

FINDINGS: Mediastinum/Lymph Nodes: No pulmonary emboli or thoracic aortic
dissection identified. No masses or pathologically enlarged lymph
nodes identified.

Lungs/Pleura: Increased patchy interstitial prominence in both
lungs. Mild bullous changes. The previously demonstrated 9 mm right
lower lobe pulmonary nodule is unchanged on image number 86.
Additional, multiple smaller, small bilateral lower lobe nodules are
no longer visualized. Some of these may be obscured by interval
atelectasis. The previously demonstrated 14 mm right middle lobe
nodule continues to measure 14 mm in maximum diameter today on image
number 88 and appears more elongated and less nodular.

Upper abdomen: No acute findings.

Musculoskeletal: Thoracic spine degenerative changes.

Review of the MIP images confirms the above findings.
IMPRESSION: 1. No pulmonary emboli.
2. Bilateral lower lobe atelectasis.
3. Interval interstitial pulmonary edema or mild interstitial
pneumonitis superimposed on mild changes of COPD.
4. The 2 larger right lung nodules are unchanged in maximum
diameter. The right middle lobe nodular appears more linear today.
As previously discussed, these could be further evaluated with a
PET-CT once the patient's current infectious episode is resolved.

## 2016-09-17 MED ORDER — BLOOD GLUCOSE METER KIT: PACK | 0 refills | Status: DC

## 2016-09-17 NOTE — Telephone Encounter (Signed)
RX was faxed

## 2016-09-17 NOTE — Telephone Encounter (Signed)
Patient has a insurance change and has requested a Rx for a meter . She requested a Aviva with test strips or One touch with test strips  Pharmacy [facility]

## 2016-09-30 DIAGNOSIS — J449 Chronic obstructive pulmonary disease, unspecified: Secondary | ICD-10-CM | POA: Diagnosis not present

## 2016-10-06 ENCOUNTER — Telehealth: Payer: Self-pay | Admitting: Pulmonary Disease

## 2016-10-06 MED ORDER — TIOTROPIUM BROMIDE MONOHYDRATE 18 MCG IN CAPS: 18.0000 ug | ORAL_CAPSULE | Freq: Every day | RESPIRATORY_TRACT | 3 refills | Status: DC

## 2016-10-06 NOTE — Telephone Encounter (Signed)
Pt informed RX has been sent. Nothing further needed.

## 2016-10-06 NOTE — Telephone Encounter (Signed)
°*  STAT* If patient is at the pharmacy, call can be transferred to refill team.   1. Which medications need to be refilled? (please list name of each medication and dose if known)  spriva   2. Which pharmacy/location (including street and city if local pharmacy) is medication to be sent to?  [facility] in [place]   3. Do they need a 30 day or 90 day supply?  90 day

## 2016-10-07 NOTE — Telephone Encounter (Signed)
Martha Woodard has been approved.  Your request has been approved  PA Case [id number] is Approved. For further questions, call [phone].  Pharmacy informed. Nothing further needed.

## 2016-10-13 ENCOUNTER — Telehealth: Payer: Self-pay | Admitting: Cardiovascular Disease

## 2016-10-13 NOTE — Telephone Encounter (Signed)
Pt is on lasix 20 mg prn, but no K+. Last CMET 08/26/16 showed K+ 4.2. What dose of K+ do you want her to take?

## 2016-10-13 NOTE — Telephone Encounter (Signed)
Pt calling asking if we can send in a prescription in for Potassium  [facility] in [place]  Please advise

## 2016-10-14 MED ORDER — POTASSIUM CHLORIDE ER 10 MEQ PO TBCR: 10.0000 meq | EXTENDED_RELEASE_TABLET | Freq: Every day | ORAL | 3 refills | Status: DC

## 2016-10-14 NOTE — Telephone Encounter (Signed)
OK to prescribe Kdur 10 mEq daily We'll need follow-up lab work with primary care or on her next follow-up visit

## 2016-10-14 NOTE — Telephone Encounter (Signed)
Spoke w/ pt.  Advised her of Dr. Donivan Scull recommendation. She will call back w/ any other questions or concerns.

## 2016-10-17 ENCOUNTER — Other Ambulatory Visit: Payer: Self-pay | Admitting: Family Medicine

## 2016-10-30 ENCOUNTER — Ambulatory Visit: Payer: BLUE CROSS/BLUE SHIELD | Admitting: General Surgery

## 2016-10-31 DIAGNOSIS — J449 Chronic obstructive pulmonary disease, unspecified: Secondary | ICD-10-CM | POA: Diagnosis not present

## 2016-11-13 ENCOUNTER — Encounter: Payer: Self-pay | Admitting: Family Medicine

## 2016-11-13 ENCOUNTER — Ambulatory Visit (INDEPENDENT_AMBULATORY_CARE_PROVIDER_SITE_OTHER): Payer: Medicare Other | Admitting: Family Medicine

## 2016-11-13 VITALS — BP 142/76 | HR 88 | Temp 98.8°F | Wt 273.4 lb

## 2016-11-13 DIAGNOSIS — J019 Acute sinusitis, unspecified: Secondary | ICD-10-CM | POA: Insufficient documentation

## 2016-11-13 DIAGNOSIS — J0100 Acute maxillary sinusitis, unspecified: Secondary | ICD-10-CM | POA: Diagnosis not present

## 2016-11-13 DIAGNOSIS — J9610 Chronic respiratory failure, unspecified whether with hypoxia or hypercapnia: Secondary | ICD-10-CM | POA: Insufficient documentation

## 2016-11-13 MED ORDER — FLUCONAZOLE 100 MG PO TABS: 100.0000 mg | ORAL_TABLET | Freq: Every day | ORAL | 0 refills | Status: DC

## 2016-11-13 MED ORDER — DOXYCYCLINE HYCLATE 100 MG PO TABS
100.0000 mg | ORAL_TABLET | Freq: Two times a day (BID) | ORAL | 0 refills | Status: DC
Start: 2016-11-13 — End: 2017-02-11

## 2016-11-13 NOTE — Patient Instructions (Addendum)
Medications as prescribed.  Let me know if you worsen or fail to improve.  Follow up in 3-6 months.  Take care  Dr. Lacinda Axon

## 2016-11-13 NOTE — Assessment & Plan Note (Signed)
New acute problem. Treating with Doxycycline. 

## 2016-11-13 NOTE — Progress Notes (Signed)
Subjective:  Patient ID: Martha Woodard, female    DOB: [birth year]  Age: 51 y.o. MRN: [id number]  CC: Left ear pain, sinus pressure, congestion  HPI:  51 year old female with an extensive past medical history including DM 2, COPD, chronic respiratory failure, obesity hypoventilation, diastolic CHF, morbid obesity presents with the above complaints.  Patient reports a two-week history of severe sinus pain and pressure as well as productive cough. She has recently developed associated left ear pain. No known exacerbating or relieving factors. Her current pain is mild to moderate in severity. No associated fever. She has reported some associated dental pain. No other complaints or concerns.  Social Hx   Social History   Social History  . Marital status: Single    Spouse name: N/A  . Number of children: N/A  . Years of education: N/A   Social History Main Topics  . Smoking status: Former Smoker    Years: 33.00    Quit date: 07/10/2013  . Smokeless tobacco: Never Used  . Alcohol use No  . Drug use: No  . Sexual activity: Not Asked   Other Topics Concern  . None   Social History Narrative  . None    Review of Systems  Constitutional: Negative for fever.  HENT: Positive for congestion, ear pain, sinus pain and sinus pressure.   Respiratory: Positive for cough.    Objective:  BP (!) 142/76   Pulse 88   Temp 98.8 F (37.1 C) (Oral)   Wt 273 lb 6.4 oz (124 kg)   SpO2 94%   BMI 46.93 kg/m   BP/Weight 11/13/2016 08/26/2016 0000000  Systolic BP [id number]  Diastolic BP 76 72 72  Wt. (Lbs) 273.4 263 263.75  BMI 46.93 45.14 45.27   Physical Exam  Constitutional: She is oriented to person, place, and time. She appears well-developed. No distress.  HENT:  Normal TMs bilaterally. Severe maxillary sinus tenderness to palpation.  Cardiovascular: Normal rate and regular rhythm.   Pulmonary/Chest: Effort normal and breath sounds normal.  Neurological: She is alert and  oriented to person, place, and time.  Psychiatric: She has a normal mood and affect.  Vitals reviewed.  Lab Results  Component Value Date   WBC 8.0 08/26/2016   HGB 14.1 08/26/2016   HCT 42.8 08/26/2016   PLT 295.0 08/26/2016   GLUCOSE 117 (H) 08/26/2016   CHOL 158 08/26/2016   TRIG 176.0 (H) 08/26/2016   HDL 43.60 08/26/2016   LDLDIRECT 71.0 11/13/2015   LDLCALC 79 08/26/2016   ALT 27 08/26/2016   AST 21 08/26/2016   NA 143 08/26/2016   K 4.2 08/26/2016   CL 99 08/26/2016   CREATININE 0.63 08/26/2016   BUN 11 08/26/2016   CO2 37 (H) 08/26/2016   TSH 1.805 06/11/2015   HGBA1C 6.2 08/26/2016    Assessment & Plan:   Problem List Items Addressed This Visit    Acute sinusitis - Primary    New acute problem. Treating with Doxycycline.      Relevant Medications   fluconazole (DIFLUCAN) 100 MG tablet   doxycycline (VIBRA-TABS) 100 MG tablet      Meds ordered this encounter  Medications  . fluconazole (DIFLUCAN) 100 MG tablet    Sig: Take 1 tablet (100 mg total) by mouth daily.    Dispense:  14 tablet    Refill:  0  . doxycycline (VIBRA-TABS) 100 MG tablet    Sig: Take 1 tablet (100 mg total) by mouth 2 (  two) times daily.    Dispense:  20 tablet    Refill:  0    Follow-up: 3-6 months  [place] DO [facility]

## 2016-11-13 NOTE — Progress Notes (Signed)
Pre visit review using our clinic review tool, if applicable. No additional management support is needed unless otherwise documented below in the visit note. 

## 2016-11-17 ENCOUNTER — Other Ambulatory Visit: Payer: Self-pay | Admitting: Family Medicine

## 2016-11-28 DIAGNOSIS — J449 Chronic obstructive pulmonary disease, unspecified: Secondary | ICD-10-CM | POA: Diagnosis not present

## 2016-12-04 ENCOUNTER — Telehealth: Payer: Self-pay | Admitting: Pulmonary Disease

## 2016-12-04 NOTE — Telephone Encounter (Signed)
Pt states she has an oxygen tank, and would like a "pocketbook size, that she can recharge". Please call.

## 2016-12-04 NOTE — Telephone Encounter (Signed)
Once explaining how the POC works pt has decided not to get a POC. Will call back if she needs anything further.

## 2016-12-17 ENCOUNTER — Ambulatory Visit: Payer: Medicare Other

## 2016-12-18 ENCOUNTER — Other Ambulatory Visit: Payer: Self-pay | Admitting: Family Medicine

## 2016-12-19 ENCOUNTER — Other Ambulatory Visit: Payer: Self-pay | Admitting: Family Medicine

## 2016-12-19 MED ORDER — FLUTICASONE PROPIONATE 50 MCG/ACT NA SUSP: NASAL | 12 refills | Status: DC

## 2016-12-22 ENCOUNTER — Other Ambulatory Visit: Payer: Self-pay | Admitting: Family Medicine

## 2016-12-22 ENCOUNTER — Telehealth: Payer: Self-pay | Admitting: *Deleted

## 2016-12-22 MED ORDER — NYSTATIN 100000 UNIT/ML MT SUSP: 5.0000 mL | Freq: Four times a day (QID) | OROMUCOSAL | 0 refills | Status: DC

## 2016-12-22 NOTE — Telephone Encounter (Signed)
Rx sent 

## 2016-12-22 NOTE — Telephone Encounter (Signed)
Patient has requested a medication refill on Nystatin. She has a case a thrush after having a tooth exstracted.  Pt contact [phone]  Pharmacy [facility]

## 2016-12-24 ENCOUNTER — Encounter: Payer: Self-pay | Admitting: Surgical

## 2016-12-24 ENCOUNTER — Other Ambulatory Visit: Payer: Self-pay | Admitting: Family Medicine

## 2016-12-29 DIAGNOSIS — J449 Chronic obstructive pulmonary disease, unspecified: Secondary | ICD-10-CM | POA: Diagnosis not present

## 2017-01-26 ENCOUNTER — Telehealth: Payer: Self-pay | Admitting: Family Medicine

## 2017-01-26 MED ORDER — HYDROCHLOROTHIAZIDE 12.5 MG PO CAPS: 12.5000 mg | ORAL_CAPSULE | Freq: Every day | ORAL | 0 refills | Status: DC

## 2017-01-26 NOTE — Telephone Encounter (Signed)
Pt called about needing a refill for hydrochlorothiazide (MICROZIDE) 12.5 MG capsule.  Pharmacy is [place], [place].  Call pt @ [phone]. Thank you!

## 2017-01-26 NOTE — Telephone Encounter (Signed)
Refill was sent to pharmacy.

## 2017-01-28 DIAGNOSIS — J449 Chronic obstructive pulmonary disease, unspecified: Secondary | ICD-10-CM | POA: Diagnosis not present

## 2017-02-11 ENCOUNTER — Encounter: Payer: Self-pay | Admitting: Family Medicine

## 2017-02-11 ENCOUNTER — Ambulatory Visit (INDEPENDENT_AMBULATORY_CARE_PROVIDER_SITE_OTHER): Payer: Medicare Other | Admitting: Family Medicine

## 2017-02-11 VITALS — BP 152/84 | HR 103 | Temp 98.9°F | Wt 276.1 lb

## 2017-02-11 DIAGNOSIS — I10 Essential (primary) hypertension: Secondary | ICD-10-CM | POA: Diagnosis not present

## 2017-02-11 DIAGNOSIS — R928 Other abnormal and inconclusive findings on diagnostic imaging of breast: Secondary | ICD-10-CM

## 2017-02-11 DIAGNOSIS — E782 Mixed hyperlipidemia: Secondary | ICD-10-CM

## 2017-02-11 DIAGNOSIS — J432 Centrilobular emphysema: Secondary | ICD-10-CM | POA: Diagnosis not present

## 2017-02-11 DIAGNOSIS — E1159 Type 2 diabetes mellitus with other circulatory complications: Secondary | ICD-10-CM

## 2017-02-11 MED ORDER — ASPIRIN EC 81 MG PO TBEC: 81.0000 mg | DELAYED_RELEASE_TABLET | Freq: Every day | ORAL | 3 refills | Status: AC

## 2017-02-11 NOTE — Assessment & Plan Note (Signed)
At goal.  Continue metformin. 

## 2017-02-11 NOTE — Progress Notes (Signed)
Subjective:  Patient ID: Martha Woodard, female    DOB: 1966-01-16  Age: 51 y.o. MRN: [id number]  CC: Follow up  HPI:  51 year old female with an extensive past mental history presents for follow-up.  HTN  BP's have been stable.  BP elevated today.  Endorses compliance with Lasix, HCTZ, losartan, metoprolol.  Hyperlipidemia  Has been stable. Currently on Lipitor 10 mg daily.  DM 2  Patient had a visit at home and an A1c was obtained. A1c was 5.5 on 12/23/16.  She is tolerating metformin without difficulty.  COPD/chronic respiratory failure/obesity hypoventilation  Stable currently on O2, dulera, Spiriva.  Social Hx   Social History   Social History  . Marital status: Single    Spouse name: N/A  . Number of children: N/A  . Years of education: N/A   Social History Main Topics  . Smoking status: Former Smoker    Years: 33.00    Quit date: 07/10/2013  . Smokeless tobacco: Never Used  . Alcohol use No  . Drug use: No  . Sexual activity: Not Asked   Other Topics Concern  . None   Social History Narrative  . None    Review of Systems  Respiratory: Positive for shortness of breath.   Psychiatric/Behavioral:       Sadness.    Objective:  BP (!) 152/84   Pulse (!) 103   Temp 98.9 F (37.2 C) (Oral)   Wt 276 lb 2 oz (125.2 kg)   SpO2 94%   BMI 47.40 kg/m   BP/Weight 02/11/2017 11/13/2016 50/93/2671  Systolic BP 245 809 983  Diastolic BP 84 76 72  Wt. (Lbs) 276.13 273.4 263  BMI 47.4 46.93 45.14   Physical Exam  Constitutional: She is oriented to person, place, and time. She appears well-developed. No distress.  Cardiovascular: Normal rate and regular rhythm.   Pulmonary/Chest: Effort normal. She has no wheezes. She has no rales.  Neurological: She is alert and oriented to person, place, and time.  Psychiatric: She has a normal mood and affect.  Vitals reviewed.   Lab Results  Component Value Date   WBC 8.0 08/26/2016   HGB 14.1 08/26/2016    HCT 42.8 08/26/2016   PLT 295.0 08/26/2016   GLUCOSE 117 (H) 08/26/2016   CHOL 158 08/26/2016   TRIG 176.0 (H) 08/26/2016   HDL 43.60 08/26/2016   LDLDIRECT 71.0 11/13/2015   LDLCALC 79 08/26/2016   ALT 27 08/26/2016   AST 21 08/26/2016   NA 143 08/26/2016   K 4.2 08/26/2016   CL 99 08/26/2016   CREATININE 0.63 08/26/2016   BUN 11 08/26/2016   CO2 37 (H) 08/26/2016   TSH 1.805 06/11/2015   HGBA1C 6.2 08/26/2016    Assessment & Plan:   Problem List Items Addressed This Visit    Hyperlipidemia    Continue statin.      Relevant Medications   aspirin EC 81 MG tablet   Essential hypertension    BP elevated today. Will continue to monitor. Continue Lasix, HCTZ, losartan, metoprolol.       Relevant Medications   aspirin EC 81 MG tablet   DM type 2 (diabetes mellitus, type 2) ([place])    At goal. Continue metformin.       Relevant Medications   aspirin EC 81 MG tablet   COPD (chronic obstructive pulmonary disease) with emphysema (HCC)    Stable at this time. Continue Dulera, Spiriva, O2.       Other  Visit Diagnoses    Abnormal mammogram    -  Primary   Relevant Orders   MM DIAG BREAST TOMO BILATERAL   US BREAST LTD UNI LEFT INC AXILLA   US BREAST LTD UNI RIGHT INC AXILLA      Meds ordered this encounter  Medications  . aspirin EC 81 MG tablet    Sig: Take 1 tablet (81 mg total) by mouth daily.    Dispense:  90 tablet    Refill:  3   Follow-up: 6 months  [place] DO [facility]

## 2017-02-11 NOTE — Assessment & Plan Note (Signed)
Continue statin. 

## 2017-02-11 NOTE — Assessment & Plan Note (Signed)
BP elevated today. Will continue to monitor. Continue Lasix, HCTZ, losartan, metoprolol.

## 2017-02-11 NOTE — Patient Instructions (Signed)
Continue your meds.  Follow up in 6 months.  Take care  Dr. Harrel Ferrone  

## 2017-02-11 NOTE — Assessment & Plan Note (Signed)
Stable at this time. Continue Dulera, Spiriva, O2.

## 2017-02-17 ENCOUNTER — Other Ambulatory Visit: Payer: Self-pay | Admitting: Family Medicine

## 2017-02-17 DIAGNOSIS — Z1239 Encounter for other screening for malignant neoplasm of breast: Secondary | ICD-10-CM

## 2017-02-18 ENCOUNTER — Inpatient Hospital Stay
Admission: RE | Admit: 2017-02-18 | Discharge: 2017-02-18 | Disposition: A | Discharge status: Home / Self Care | Payer: Self-pay | Source: Ambulatory Visit | Attending: *Deleted | Admitting: *Deleted

## 2017-02-18 ENCOUNTER — Other Ambulatory Visit: Payer: Self-pay | Admitting: *Deleted

## 2017-02-18 DIAGNOSIS — Z9289 Personal history of other medical treatment: Secondary | ICD-10-CM

## 2017-02-28 DIAGNOSIS — J449 Chronic obstructive pulmonary disease, unspecified: Secondary | ICD-10-CM | POA: Diagnosis not present

## 2017-03-11 ENCOUNTER — Ambulatory Visit
Admission: RE | Admit: 2017-03-11 | Discharge: 2017-03-11 | Disposition: A | Discharge status: Home / Self Care | Payer: Medicare Other | Source: Ambulatory Visit | Attending: Family Medicine | Admitting: Family Medicine

## 2017-03-11 ENCOUNTER — Ambulatory Visit (INDEPENDENT_AMBULATORY_CARE_PROVIDER_SITE_OTHER): Payer: Medicare Other | Admitting: Family Medicine

## 2017-03-11 ENCOUNTER — Encounter: Payer: Self-pay | Admitting: Family Medicine

## 2017-03-11 VITALS — BP 136/72 | HR 90 | Temp 98.7°F | Resp 20

## 2017-03-11 DIAGNOSIS — M7989 Other specified soft tissue disorders: Secondary | ICD-10-CM

## 2017-03-11 DIAGNOSIS — L03115 Cellulitis of right lower limb: Secondary | ICD-10-CM | POA: Diagnosis not present

## 2017-03-11 DIAGNOSIS — M79661 Pain in right lower leg: Secondary | ICD-10-CM

## 2017-03-11 DIAGNOSIS — L0390 Cellulitis, unspecified: Secondary | ICD-10-CM | POA: Insufficient documentation

## 2017-03-11 MED ORDER — DOXYCYCLINE HYCLATE 100 MG PO TABS: 100.0000 mg | ORAL_TABLET | Freq: Two times a day (BID) | ORAL | 0 refills | Status: DC

## 2017-03-11 NOTE — Progress Notes (Signed)
Subjective:  Patient ID: Martha Woodard, female    DOB: Feb 26, 1966  Age: 51 y.o. MRN: [id number]  CC: Leg swelling, redness  HPI:  50 year old female with DM-2, COPD, Respiratory failure, morbid obesity, chronic diastolic CHF presents with the above complaints.  Patient reports that yesterday she noted redness, swelling of her right lower leg. Tender. Associate pain. She elevated the leg last night with improvement. She recently was "scratched" by her dog earlier in the week. No current fever. No recent medication changes, immobilization or other risk factors for DVT. No other complaints or concerns at this time.  Social Hx   Social History   Social History  . Marital status: Single    Spouse name: N/A  . Number of children: N/A  . Years of education: N/A   Social History Main Topics  . Smoking status: Former Smoker    Years: 33.00    Quit date: 07/10/2013  . Smokeless tobacco: Never Used  . Alcohol use No  . Drug use: No  . Sexual activity: Not Asked   Other Topics Concern  . None   Social History Narrative  . None    Review of Systems  Cardiovascular: Positive for leg swelling.  Skin:       R lower leg redness.     Objective:  BP 136/72 (BP Location: Left Arm, Patient Position: Sitting, Cuff Size: Normal)   Pulse 90   Temp 98.7 F (37.1 C)   Resp 20   SpO2 96%   BP/Weight 03/11/2017 02/11/2017 9/76/7341  Systolic BP 937 902 409  Diastolic BP 72 84 76  Wt. (Lbs) - 276.13 273.4  BMI - 47.4 46.93    Physical Exam  Constitutional: She is oriented to person, place, and time. She appears well-developed. No distress.  Pulmonary/Chest: Effort normal.  [place] O2 in place.   Neurological: She is alert and oriented to person, place, and time.  Skin:  R lower leg -  1-2+ pitting edema. Erythema noted. Tender to palpation.   Psychiatric: She has a normal mood and affect.  Vitals reviewed.   Lab Results  Component Value Date   WBC 8.0 08/26/2016   HGB 14.1  08/26/2016   HCT 42.8 08/26/2016   PLT 295.0 08/26/2016   GLUCOSE 117 (H) 08/26/2016   CHOL 158 08/26/2016   TRIG 176.0 (H) 08/26/2016   HDL 43.60 08/26/2016   LDLDIRECT 71.0 11/13/2015   LDLCALC 79 08/26/2016   ALT 27 08/26/2016   AST 21 08/26/2016   NA 143 08/26/2016   K 4.2 08/26/2016   CL 99 08/26/2016   CREATININE 0.63 08/26/2016   BUN 11 08/26/2016   CO2 37 (H) 08/26/2016   TSH 1.805 06/11/2015   HGBA1C 6.2 08/26/2016    Assessment & Plan:   Problem List Items Addressed This Visit      Other   Cellulitis - Primary    New problem. [place] negative for DVT.  Exam consistent with cellulitis. Treating with Doxy.        Other Visit Diagnoses    Pain and swelling of lower leg, right       Relevant Orders   US Venous Img Lower Unilateral Right (Completed)      Meds ordered this encounter  Medications  . doxycycline (VIBRA-TABS) 100 MG tablet    Sig: Take 1 tablet (100 mg total) by mouth 2 (two) times daily.    Dispense:  14 tablet    Refill:  0  Follow-up: PRN  [place]

## 2017-03-11 NOTE — Progress Notes (Signed)
Patient came into the office, complaints of redness and swelling in right leg.  Triaged patient, last OV was 02/11/17.  Noticed yesterday that right leg was red, tender to touch, swollen. On inspection it is pitting 1+, tender, only  From above the ankle to just below the knee.  It is red in color and glossy.   Patient did elevate the leg last night after she was on it all day yesterday.   She was sick over the weekend with chills, sweats, no noted fever, nauseated , developed a fever blister under right nares.  Put Carmex on the fever blister it is better.  Finally felt better on Monday.  No changes to her medications.  Hasn't taken the PRN Lasix in about a week.. Put on the schedule for Dr. Lacinda Axon to see to r/o DVT. thanks

## 2017-03-11 NOTE — Patient Instructions (Signed)
Antibiotic as prescribed.  I will call with [place] results.  Take care  Dr. Lacinda Axon

## 2017-03-11 NOTE — Assessment & Plan Note (Signed)
New problem. [place] negative for DVT.  Exam consistent with cellulitis. Treating with Doxy.

## 2017-03-12 ENCOUNTER — Telehealth: Payer: Self-pay | Admitting: *Deleted

## 2017-03-12 NOTE — Telephone Encounter (Signed)
Patient has requested to have a new Rx to get a new Oxygen machine and supplies.  Company: [employer] [phone] Fax [phone]

## 2017-03-13 ENCOUNTER — Telehealth: Payer: Self-pay | Admitting: Pulmonary Disease

## 2017-03-13 NOTE — Telephone Encounter (Signed)
Needs to come from Pulmonary.

## 2017-03-13 NOTE — Telephone Encounter (Signed)
Current DME is [facility] patient was set in hospital 1 year ago. Patient has appointment 04/2017 and will hold off until then.  Patient was having trouble with getting her tank refilled but got that straightened out. Nothing further needed.

## 2017-03-13 NOTE — Telephone Encounter (Signed)
Patient notified and voiced understanding.

## 2017-03-13 NOTE — Telephone Encounter (Signed)
Pt would like to switch oxygen providers to [place]. Fax [phone] # [phone]

## 2017-03-13 NOTE — Telephone Encounter (Signed)
LMTCB x 1 

## 2017-03-18 ENCOUNTER — Ambulatory Visit
Admission: RE | Admit: 2017-03-18 | Discharge: 2017-03-18 | Disposition: A | Discharge status: Home / Self Care | Payer: Medicare Other | Source: Ambulatory Visit | Attending: Family Medicine | Admitting: Family Medicine

## 2017-03-18 DIAGNOSIS — Z1231 Encounter for screening mammogram for malignant neoplasm of breast: Secondary | ICD-10-CM | POA: Insufficient documentation

## 2017-03-18 DIAGNOSIS — Z1239 Encounter for other screening for malignant neoplasm of breast: Secondary | ICD-10-CM

## 2017-03-19 ENCOUNTER — Other Ambulatory Visit: Payer: Self-pay | Admitting: Family Medicine

## 2017-03-20 ENCOUNTER — Other Ambulatory Visit: Payer: Self-pay | Admitting: Family Medicine

## 2017-03-20 DIAGNOSIS — R928 Other abnormal and inconclusive findings on diagnostic imaging of breast: Secondary | ICD-10-CM

## 2017-03-20 DIAGNOSIS — N6489 Other specified disorders of breast: Secondary | ICD-10-CM

## 2017-03-25 ENCOUNTER — Ambulatory Visit
Admission: RE | Admit: 2017-03-25 | Discharge: 2017-03-25 | Disposition: A | Discharge status: Home / Self Care | Payer: Medicare Other | Source: Ambulatory Visit | Attending: Family Medicine | Admitting: Family Medicine

## 2017-03-25 DIAGNOSIS — R928 Other abnormal and inconclusive findings on diagnostic imaging of breast: Secondary | ICD-10-CM

## 2017-03-25 DIAGNOSIS — N6489 Other specified disorders of breast: Secondary | ICD-10-CM | POA: Diagnosis not present

## 2017-03-30 DIAGNOSIS — J449 Chronic obstructive pulmonary disease, unspecified: Secondary | ICD-10-CM | POA: Diagnosis not present

## 2017-04-10 NOTE — Progress Notes (Signed)
Cardiology Office Note  Date:  04/13/2017   ID:  Martha Woodard, Martha Woodard 01/06/1966, MRN [id number]  PCP:  Coral Spikes, DO   Chief Complaint  Patient presents with  . other    6 month follow up. Meds reviewed by the pt. verbally. "doing well."     HPI:   Martha Woodard is a very pleasant 51 year old woman with a history of  diabetes,  asthma,  COPD, Quit smoking 2014, smoked for >30 yrs lung nodules  hospital for acute respiratory distress, 20 pound weight gain, hypoxia, diagnosed with COPD exacerbation and acute diastolic CHF  Chronic LE edema>L obesity hypoventilation she has follow-up   She presents for follow-up in the clinic for chronic diastolic CHF Weight has been stable, denies worsening shortness of breath or leg swelling  one liter oxygen day walking around, 2 liters at night, none in the day Not using lasix, once every two months Takes HCTZ  Lab work reviewed HBA1C 6.2 Total chol 158, LDL 79  stopped smoking 06/2013  EKG on today's visit shows normal sinus rhythm with rate 86 bpm, no significant ST or T-wave changes  Other past medical history reviewed Following hospital discharge weight was initially 280 pounds, now down to 270 pounds Prior to admission to the hospital weight was 296 pounds  On presentation to the hospital, She was profoundly hypoxic with O2 sats in the 70s and was initiated on BiPAP mask.   labs revealed hypercapnia pCO2 level of 79, hypoxia, hyperglycemia.  Chest x-ray revealed cardiomegaly but no edema or consolidation.    CTA of the chest showed no pulmonary embolus. Bilateral lower lobe atelectasis, pulmonary edema versus interstitial pneumonitis superimposed on changes of COPD and right lung nodules which were unchanged.   She was initiated on steroids, antibiotics, inhalation therapy as well as diuretics.    hypoxic intermittently, especially at nighttime and sitting in the bed, concerning for obesity hypoventilation syndrome.     PMH:   has a past medical history of CHF (congestive heart failure) ([place]); Colon polyp (01-01-2016); COPD (chronic obstructive pulmonary disease) ([place]); Diabetes mellitus without complication ([place]); Diastolic heart failure ([place]); HLD (hyperlipidemia); Hypertension; Morbid obesity with BMI of 45.0-49.9, adult ([place]); Multiple lung nodules on CT; and Shortness of breath dyspnea.  PSH:    Past Surgical History:  Procedure Laterality Date  . BIOPSY THYROID Right 02-06-14   NEGATIVE FOR MALIGNANT CELLS proteinaceous material and macrophages.  Marland Kitchen BREAST BIOPSY Left    neg  . COLONOSCOPY WITH PROPOFOL N/A 01/01/2016   Procedure: COLONOSCOPY WITH PROPOFOL;  Surgeon: Lucilla Lame, MD;  Location: [facility] ENDOSCOPY;  Service: Endoscopy;  Laterality: N/A;  . thryoid fna Right April 2015   Proteinaceous material and macrophages.  . WISDOM TOOTH EXTRACTION      Current Outpatient Prescriptions  Medication Sig Dispense Refill  . ACCU-CHEK AVIVA PLUS test strip TEST UP TO 4 TIMES A DAY AS DIRECTED 100 each 4  . ACCU-CHEK SOFTCLIX LANCETS lancets TEST UP TO 4 TIMES A DAY 100 each 3  . albuterol (PROVENTIL) (2.5 MG/3ML) 0.083% nebulizer solution Take 3 mLs (2.5 mg total) by nebulization every 6 (six) hours as needed for wheezing or shortness of breath. 75 mL 12  . aspirin EC 81 MG tablet Take 1 tablet (81 mg total) by mouth daily. 90 tablet 3  . atorvastatin (LIPITOR) 10 MG tablet Take 1 tablet (10 mg total) by mouth daily. 90 tablet 3  . blood glucose meter kit and supplies Dispense based  on patient and insurance preference. Use up to four times daily as directed. (FOR ICD-9 250.00, 250.01). 1 each 0  . DULERA 100-5 MCG/ACT AERO INHALE 2 PUFFS INTO LUNG THE LUNGS TWICEDAILY 13 g 3  . fluticasone (FLONASE) 50 MCG/ACT nasal spray USE 1 OR 2 SPRAYS IN EACH NOSTRIL ONCE DAILY. 16 g 12  . furosemide (LASIX) 20 MG tablet Take 1 tablet (20 mg total) by mouth daily as needed. 30 tablet 6  . hydrochlorothiazide  (MICROZIDE) 12.5 MG capsule Take 1 capsule (12.5 mg total) by mouth daily. 90 capsule 0  . losartan (COZAAR) 25 MG tablet TAKE 1 TABLET BY MOUTH TWICE DAILY 180 tablet 3  . metFORMIN (GLUCOPHAGE) 500 MG tablet TAKE 2 TABLETS BY MOUTH TWICE DAILY WITHMEALS 180 tablet 2  . metoprolol succinate (TOPROL-XL) 25 MG 24 hr tablet Take 1 tablet (25 mg total) by mouth daily. 90 tablet 3  . Multiple Vitamin (MULTIVITAMIN) tablet Take 1 tablet by mouth daily.    Marland Kitchen nystatin (MYCOSTATIN) 100000 UNIT/ML suspension Take 5 mLs (500,000 Units total) by mouth 4 (four) times daily. Swish and swallow 120 mL 0  . OXYGEN Inhale 2 L into the lungs daily. 2 liters at night, 1 liter when doing activities, and no oxygen when at home during the day.    . tiotropium (SPIRIVA) 18 MCG inhalation capsule Place 1 capsule (18 mcg total) into inhaler and inhale daily. 90 capsule 3  . VENTOLIN HFA 108 (90 BASE) MCG/ACT inhaler INHALE 2 PUFFS BY MOUTH 4 TIMES DAILY AS NEEDED. 18 g 2  . potassium chloride (K-DUR) 10 MEQ tablet Take 1 tablet (10 mEq total) by mouth daily. 30 tablet 3   No current facility-administered medications for this visit.      Allergies:   Augmentin [amoxicillin-pot clavulanate]   Social History:  The patient  reports that she quit smoking about 3 years ago. She quit after 33.00 years of use. She has never used smokeless tobacco. She reports that she does not drink alcohol or use drugs.   Family History:   family history includes Breast cancer (age of onset: 53) in her sister; COPD in her sister; Cancer (age of onset: 89) in her mother; Goiter in her mother; Heart murmur in her sister; Stroke in her brother and father.    Review of Systems: Review of Systems  Constitutional: Negative.   Respiratory: Positive for shortness of breath.   Cardiovascular: Positive for leg swelling.  Gastrointestinal: Negative.   Musculoskeletal: Negative.   Neurological: Negative.   Psychiatric/Behavioral: Negative.   All  other systems reviewed and are negative.    PHYSICAL EXAM: VS:  BP (!) 152/84 (BP Location: Left Arm, Patient Position: Sitting, Cuff Size: Large)   Pulse 82   Ht 5' 6"  (1.676 m)   Wt 276 lb 4 oz (125.3 kg)   BMI 44.59 kg/m  , BMI Body mass index is 44.59 kg/m. Blood pressure elevated, was rushing today GEN: Well nourished, well developed, in no acute distress , obese HEENT: normal  Neck: no JVD, carotid bruits, or masses Cardiac: RRR; no murmurs, rubs, or gallops,no edema  Respiratory:  clear to auscultation bilaterally, normal work of breathing GI: soft, nontender, nondistended, + BS MS: no deformity or atrophy  Skin: warm and dry, no rash Neuro:  Strength and sensation are intact Psych: euthymic mood, full affect    Recent Labs: 08/26/2016: ALT 27; BUN 11; Creatinine, Ser 0.63; Hemoglobin 14.1; Platelets 295.0; Potassium 4.2; Sodium 143  Lipid Panel Lab Results  Component Value Date   CHOL 158 08/26/2016   HDL 43.60 08/26/2016   LDLCALC 79 08/26/2016   TRIG 176.0 (H) 08/26/2016      Wt Readings from Last 3 Encounters:  04/13/17 276 lb 4 oz (125.3 kg)  02/11/17 276 lb 2 oz (125.2 kg)  11/13/16 273 lb 6.4 oz (124 kg)       ASSESSMENT AND PLAN:   Chronic diastolic heart failure (HCC) - Plan: EKG 12-Lead Takes HCTZ daily, not on Lasix daily, weight is stable, No changes to her medications  Essential hypertension - Plan: EKG 12-Lead Blood pressure elevated, previously well controlled We'll monitor for now. Was rushing today  Mixed hyperlipidemia Cholesterol is at goal on the current lipid regimen. No changes to the medications were made.  Type 2 diabetes mellitus with other circulatory complication, without long-term current use of insulin (HCC)  watching her diet Recommended water walking for weight loss  Morbid obesity with BMI of 45.0-49.9, adult ([place]) We have encouraged continued exercise, careful diet management in an effort to lose  weight.  Centrilobular emphysema ([place]) Requiring very small amounts of oxygen Currently nonsmoker   Total encounter time more than 25 minutes  Greater than 50% was spent in counseling and coordination of care with the patient   Disposition:   F/U  12 months   Orders Placed This Encounter  Procedures  . EKG 12-Lead     Signed, Esmond Plants, M.D., Ph.D. 04/13/2017  [place], [place]

## 2017-04-13 ENCOUNTER — Encounter: Payer: Self-pay | Admitting: Cardiovascular Disease

## 2017-04-13 ENCOUNTER — Ambulatory Visit (INDEPENDENT_AMBULATORY_CARE_PROVIDER_SITE_OTHER): Payer: Medicare Other | Admitting: Cardiovascular Disease

## 2017-04-13 VITALS — BP 152/84 | HR 82 | Ht 66.0 in | Wt 276.2 lb

## 2017-04-13 DIAGNOSIS — J432 Centrilobular emphysema: Secondary | ICD-10-CM

## 2017-04-13 DIAGNOSIS — I10 Essential (primary) hypertension: Secondary | ICD-10-CM

## 2017-04-13 DIAGNOSIS — E1159 Type 2 diabetes mellitus with other circulatory complications: Secondary | ICD-10-CM | POA: Diagnosis not present

## 2017-04-13 DIAGNOSIS — I5032 Chronic diastolic (congestive) heart failure: Secondary | ICD-10-CM | POA: Diagnosis not present

## 2017-04-13 DIAGNOSIS — Z6841 Body Mass Index (BMI) 40.0 and over, adult: Secondary | ICD-10-CM

## 2017-04-13 DIAGNOSIS — E662 Morbid (severe) obesity with alveolar hypoventilation: Secondary | ICD-10-CM

## 2017-04-13 DIAGNOSIS — E782 Mixed hyperlipidemia: Secondary | ICD-10-CM | POA: Diagnosis not present

## 2017-04-13 NOTE — Patient Instructions (Signed)

## 2017-04-14 ENCOUNTER — Other Ambulatory Visit: Payer: Self-pay | Admitting: Family Medicine

## 2017-04-14 DIAGNOSIS — I491 Atrial premature depolarization: Secondary | ICD-10-CM

## 2017-04-27 ENCOUNTER — Other Ambulatory Visit: Payer: Self-pay | Admitting: Family Medicine

## 2017-04-30 DIAGNOSIS — J449 Chronic obstructive pulmonary disease, unspecified: Secondary | ICD-10-CM | POA: Diagnosis not present

## 2017-05-05 ENCOUNTER — Encounter: Payer: Self-pay | Admitting: Pulmonary Disease

## 2017-05-05 ENCOUNTER — Ambulatory Visit (INDEPENDENT_AMBULATORY_CARE_PROVIDER_SITE_OTHER): Payer: Medicare Other | Admitting: Pulmonary Disease

## 2017-05-05 VITALS — BP 142/80 | HR 95 | Ht 66.0 in | Wt 271.0 lb

## 2017-05-05 DIAGNOSIS — R0609 Other forms of dyspnea: Secondary | ICD-10-CM

## 2017-05-05 DIAGNOSIS — J9612 Chronic respiratory failure with hypercapnia: Secondary | ICD-10-CM

## 2017-05-05 DIAGNOSIS — R059 Cough, unspecified: Secondary | ICD-10-CM

## 2017-05-05 DIAGNOSIS — R05 Cough: Secondary | ICD-10-CM | POA: Diagnosis not present

## 2017-05-05 DIAGNOSIS — E662 Morbid (severe) obesity with alveolar hypoventilation: Secondary | ICD-10-CM

## 2017-05-05 DIAGNOSIS — J9611 Chronic respiratory failure with hypoxia: Secondary | ICD-10-CM

## 2017-05-05 DIAGNOSIS — Z87891 Personal history of nicotine dependence: Secondary | ICD-10-CM | POA: Diagnosis not present

## 2017-05-05 DIAGNOSIS — R0600 Dyspnea, unspecified: Secondary | ICD-10-CM

## 2017-05-05 MED ORDER — MOMETASONE FURO-FORMOTEROL FUM 100-5 MCG/ACT IN AERO: INHALATION_SPRAY | RESPIRATORY_TRACT | 3 refills | Status: DC

## 2017-05-05 MED ORDER — TIOTROPIUM BROMIDE MONOHYDRATE 18 MCG IN CAPS: 18.0000 ug | ORAL_CAPSULE | Freq: Every day | RESPIRATORY_TRACT | 3 refills | Status: DC

## 2017-05-05 NOTE — Progress Notes (Signed)
PULMONARY OFFICE FOLLOW UP  PROBLEMS: Chronic hypoxic/hypercarbic respiratory failure Former smoker Moderate COPD OHS CHF  Multiple pulmonary nodules - stable > 2 yrs and deemed benign  INTERVAL HISTORY: Last seen by Dr Ashby Dawes 02/14/16. No major events since then  SUBJ: This is a routine follow up appointment. She has no new complaints. She remains moderately to severely limited by dyspnea. Her current COPD regimen remains as previously - Dulera, Spiriva and PRN albuterol. She almost never uses a rescue inhaler. Her symptoms are slightly worse in heat and humidity. She has intermittent cough with posterior nasal drainage. She uses over-the-counter antihistamine and Flonase as needed. Denies CP, fever, purulent sputum, LE edema and calf tenderness   OBJ: Vitals:   05/05/17 0841 05/05/17 0845  BP:  (!) 142/80  Pulse:  95  SpO2:  95%  Weight: 271 lb (122.9 kg)   Height: 5\' 6"  (1.676 m)   1 lpm [place]  No overt distress HEENT WNL No JVD noted BS mildly diminished, no wheezes Reg, no M Obese, NABS, soft Trace bilateral ankle edema   DATA: BMP Latest Ref Rng & Units 08/26/2016 09/30/2015 09/28/2015  Glucose 70 - 99 mg/dL 117(H) 108(H) -  BUN 6 - 23 mg/dL 11 21(H) -  Creatinine 0.40 - 1.20 mg/dL 0.63 0.60 0.54  BUN/Creat Ratio 9 - 23 - - -  Sodium 135 - 145 mEq/L 143 139 -  Potassium 3.5 - 5.1 mEq/L 4.2 4.0 -  Chloride 96 - 112 mEq/L 99 86(L) -  CO2 19 - 32 mEq/L 37(H) 45(H) -  Calcium 8.4 - 10.5 mg/dL 10.1 9.1 -   CBC Latest Ref Rng & Units 08/26/2016 09/30/2015 09/26/2015  WBC 4.0 - 10.5 K/uL 8.0 7.5 8.0  Hemoglobin 12.0 - 15.0 g/dL 14.1 14.6 14.0  Hematocrit 36.0 - 46.0 % 42.8 47.5(H) 44.9  Platelets 150.0 - 400.0 K/uL 295.0 225 265   CXR: No new film  IMPRESSION: Chronic hypoxic/hypercarbic respiratory failure Former smoker Moderate COPD Obesity hypoventilation syndrome CHF - presently well compensated DOE - multifactorial Multiple pulmonary nodules - stable  > 2 yrs and deemed benign  PLAN: 1) Continue Dulera and Spiriva 2) Continue PRN albuterol 3) Continue oxygen therapy - 1 lpm during day, 2 lpm [place] with sleep 4) we discussed her weight, it's contribution to her DOE and exercise limitation. We discussed weight loss strategies with an emphasis on reducing simple starches and sugars. 5) Follow up 6 months with target weight of 255#  Merton Border, MD PCCM service Mobile [phone] Pager [phone] 05/05/2017 9:17 AM

## 2017-05-05 NOTE — Patient Instructions (Signed)
Continue all of your medications as currently prescribed  We discussed weight loss and dietary strategies.  Follow-up in 6 months with a target weight of 255#

## 2017-05-31 DIAGNOSIS — J449 Chronic obstructive pulmonary disease, unspecified: Secondary | ICD-10-CM | POA: Diagnosis not present

## 2017-06-10 ENCOUNTER — Other Ambulatory Visit: Payer: Self-pay | Admitting: Family Medicine

## 2017-06-30 DIAGNOSIS — J449 Chronic obstructive pulmonary disease, unspecified: Secondary | ICD-10-CM | POA: Diagnosis not present

## 2017-07-09 ENCOUNTER — Encounter: Payer: Self-pay | Admitting: Family Medicine

## 2017-07-09 NOTE — Progress Notes (Signed)
Patient is currently taking losartan since 09/17/2016 and HCTZ since 04/27/2017

## 2017-07-31 ENCOUNTER — Other Ambulatory Visit: Payer: Self-pay | Admitting: Family Medicine

## 2017-07-31 DIAGNOSIS — J449 Chronic obstructive pulmonary disease, unspecified: Secondary | ICD-10-CM | POA: Diagnosis not present

## 2017-08-03 NOTE — Telephone Encounter (Signed)
Establishing with Dr. Caryl Bis on 08/07/17 ok to send HCTZ last labs 11/17?

## 2017-08-07 ENCOUNTER — Ambulatory Visit (INDEPENDENT_AMBULATORY_CARE_PROVIDER_SITE_OTHER): Payer: Medicare Other | Admitting: Family Medicine

## 2017-08-07 ENCOUNTER — Other Ambulatory Visit: Payer: Self-pay | Admitting: Family Medicine

## 2017-08-07 ENCOUNTER — Encounter: Payer: Self-pay | Admitting: Family Medicine

## 2017-08-07 VITALS — BP 130/76 | HR 73 | Temp 99.0°F | Wt 262.0 lb

## 2017-08-07 DIAGNOSIS — R252 Cramp and spasm: Secondary | ICD-10-CM

## 2017-08-07 DIAGNOSIS — E782 Mixed hyperlipidemia: Secondary | ICD-10-CM | POA: Diagnosis not present

## 2017-08-07 DIAGNOSIS — Z23 Encounter for immunization: Secondary | ICD-10-CM

## 2017-08-07 DIAGNOSIS — G8929 Other chronic pain: Secondary | ICD-10-CM | POA: Diagnosis not present

## 2017-08-07 DIAGNOSIS — J432 Centrilobular emphysema: Secondary | ICD-10-CM

## 2017-08-07 DIAGNOSIS — R109 Unspecified abdominal pain: Secondary | ICD-10-CM

## 2017-08-07 DIAGNOSIS — R945 Abnormal results of liver function studies: Principal | ICD-10-CM

## 2017-08-07 DIAGNOSIS — E1159 Type 2 diabetes mellitus with other circulatory complications: Secondary | ICD-10-CM | POA: Diagnosis not present

## 2017-08-07 DIAGNOSIS — R911 Solitary pulmonary nodule: Secondary | ICD-10-CM

## 2017-08-07 DIAGNOSIS — I10 Essential (primary) hypertension: Secondary | ICD-10-CM | POA: Diagnosis not present

## 2017-08-07 DIAGNOSIS — I872 Venous insufficiency (chronic) (peripheral): Secondary | ICD-10-CM | POA: Insufficient documentation

## 2017-08-07 DIAGNOSIS — R7989 Other specified abnormal findings of blood chemistry: Secondary | ICD-10-CM

## 2017-08-07 LAB — CBC
HCT: 43 % (ref 36.0–46.0)
Hemoglobin: 13.8 g/dL (ref 12.0–15.0)
MCHC: 32.1 g/dL (ref 30.0–36.0)
MCV: 86.6 fl (ref 78.0–100.0)
PLATELETS: 291 10*3/uL (ref 150.0–400.0)
RBC: 4.97 Mil/uL (ref 3.87–5.11)
RDW: 14.2 % (ref 11.5–15.5)
WBC: 7 10*3/uL (ref 4.0–10.5)

## 2017-08-07 LAB — COMPREHENSIVE METABOLIC PANEL
ALBUMIN: 4.3 g/dL (ref 3.5–5.2)
ALK PHOS: 61 U/L (ref 39–117)
ALT: 41 U/L — ABNORMAL HIGH (ref 0–35)
AST: 29 U/L (ref 0–37)
BUN: 11 mg/dL (ref 6–23)
CHLORIDE: 100 meq/L (ref 96–112)
CO2: 33 mEq/L — ABNORMAL HIGH (ref 19–32)
Calcium: 10.1 mg/dL (ref 8.4–10.5)
Creatinine, Ser: 0.63 mg/dL (ref 0.40–1.20)
GFR: 105.1 mL/min (ref >60.00)
Glucose, Bld: 112 mg/dL — ABNORMAL HIGH (ref 70–99)
POTASSIUM: 4.1 meq/L (ref 3.5–5.1)
SODIUM: 141 meq/L (ref 135–145)
TOTAL PROTEIN: 7.3 g/dL (ref 6.0–8.3)
Total Bilirubin: 0.4 mg/dL (ref 0.2–1.2)

## 2017-08-07 LAB — HEMOGLOBIN A1C: HEMOGLOBIN A1C: 6.3 % (ref 4.6–6.5)

## 2017-08-07 LAB — MAGNESIUM: MAGNESIUM: 1.8 mg/dL (ref 1.5–2.5)

## 2017-08-07 LAB — CK: Total CK: 82 U/L (ref 7–177)

## 2017-08-07 NOTE — Progress Notes (Signed)
Martha Rumps, MD Phone: [phone]  Martha Woodard is a 51 y.o. female who presents today for f/u.  HYPERTENSION Disease Monitoring: Blood pressure range-not checking Chest pain- no      Dyspnea- chronic, stable, related to COPD Medications: Compliance- taking HCTZ, losartan, Lasix, metoprolol   Edema-chronic, stable related to venous insufficiency  Venous insufficiency: Patient notes slight right greater than left lower extremity swelling around her ankles.  Does have hemosiderin deposition changes.  DIABETES Disease Monitoring: Blood Sugar ranges-98-120 polyuria/phagia/dipsia-some polydipsia      visual problems-due to see ophthalmology Medications: Compliance-taking metformin  hypoglycemic symptoms-rare if she does not eat  HYPERLIPIDEMIA Disease Monitoring: See symptoms for Hypertension Medications: Compliance-Lipitor right upper quadrant pain-no muscle aches-does note some muscle aches in her legs and right side at times, feels like a spasm  COPD: Notes this is stable.  She is on oxygen.  Follows with pulmonology.  Social History   Tobacco Use  Smoking Status Former Smoker  . Years: 33.00  . Last attempt to quit: 07/10/2013  . Years since quitting: 4.0  Smokeless Tobacco Never Used     ROS see history of present illness  Objective  Physical Exam Vitals:   08/07/17 0939  BP: 130/76  Pulse: 73  Temp: 99 F (37.2 C)  SpO2: 94%    BP Readings from Last 3 Encounters:  08/07/17 130/76  05/05/17 (!) 142/80  04/13/17 (!) 152/84   Wt Readings from Last 3 Encounters:  08/07/17 262 lb (118.8 kg)  05/05/17 271 lb (122.9 kg)  04/13/17 276 lb 4 oz (125.3 kg)    Physical Exam  Constitutional: No distress.  Cardiovascular: Normal rate, regular rhythm and normal heart sounds.  Pulmonary/Chest: Effort normal and breath sounds normal.  Musculoskeletal: She exhibits edema (Minimal right slightly greater than left ankle edema, right calf 44 cm, left calf 46  cm).  Neurological: She is alert. Gait normal.  Skin: Skin is warm and dry. She is not diaphoretic.  Hemosiderin deposition changes bilateral lower legs   Assessment/Plan: Please see individual problem list.  Essential hypertension Controlled.  Continue her medications.  COPD (chronic obstructive pulmonary disease) with emphysema (HCC) Stable.  Continue to follow with pulmonology.  Hyperlipidemia Some cramps.  Potentially related to Lipitor.  Will check lab work and possibly have her hold the Lipitor for several days to see if they improve.  Just hydration for the cramps.  Lung nodule seen on imaging study Per pulmonology note these were stable for greater than 2 years and have been deemed benign.  Venous insufficiency Leg edema consistent with venous insufficiency.  Discussed elevation of her legs.  She could trial compression stockings as well.   Satina was seen today for establish care.  Diagnoses and all orders for this visit:  Essential hypertension  Type 2 diabetes mellitus with other circulatory complication, without long-term current use of insulin (HCC) -     HgB A1c  Mixed hyperlipidemia  Muscle cramps -     CK (Creatine Kinase) -     Comp Met (CMET) -     Magnesium  Chronic abdominal pain -     CBC  Need for immunization against influenza -     Flu Vaccine QUAD 36+ mos IM  Centrilobular emphysema (HCC)  Lung nodule seen on imaging study  Venous insufficiency    Orders Placed This Encounter  Procedures  . Flu Vaccine QUAD 36+ mos IM  . CK (Creatine Kinase)  . Comp Met (CMET)  .  HgB A1c  . Magnesium  . CBC    No orders of the defined types were placed in this encounter.    Martha Rumps, MD [place]

## 2017-08-07 NOTE — Patient Instructions (Signed)
Nice to see you. We will check lab work today and contact you with the results. Please try to stay well-hydrated. Please do stretches for your legs.

## 2017-08-07 NOTE — Assessment & Plan Note (Signed)
Stable.  Continue to follow with pulmonology. 

## 2017-08-07 NOTE — Assessment & Plan Note (Signed)
Leg edema consistent with venous insufficiency.  Discussed elevation of her legs.  She could trial compression stockings as well.

## 2017-08-07 NOTE — Assessment & Plan Note (Signed)
Per pulmonology note these were stable for greater than 2 years and have been deemed benign.

## 2017-08-07 NOTE — Assessment & Plan Note (Signed)
Controlled.  Continue her medications.

## 2017-08-07 NOTE — Assessment & Plan Note (Signed)
Some cramps.  Potentially related to Lipitor.  Will check lab work and possibly have her hold the Lipitor for several days to see if they improve.  Just hydration for the cramps.

## 2017-08-14 ENCOUNTER — Ambulatory Visit: Payer: Medicare Other | Admitting: Family Medicine

## 2017-08-25 ENCOUNTER — Other Ambulatory Visit: Payer: Self-pay | Admitting: Family Medicine

## 2017-08-30 DIAGNOSIS — J449 Chronic obstructive pulmonary disease, unspecified: Secondary | ICD-10-CM | POA: Diagnosis not present

## 2017-09-01 ENCOUNTER — Other Ambulatory Visit: Payer: Self-pay | Admitting: Family Medicine

## 2017-09-01 MED ORDER — NYSTATIN 100000 UNIT/GM EX CREA: 1.0000 "application " | TOPICAL_CREAM | Freq: Two times a day (BID) | CUTANEOUS | 0 refills | Status: DC

## 2017-09-03 ENCOUNTER — Other Ambulatory Visit (INDEPENDENT_AMBULATORY_CARE_PROVIDER_SITE_OTHER): Payer: Medicare Other

## 2017-09-03 ENCOUNTER — Other Ambulatory Visit: Payer: Self-pay | Admitting: Family Medicine

## 2017-09-03 DIAGNOSIS — R945 Abnormal results of liver function studies: Secondary | ICD-10-CM | POA: Diagnosis not present

## 2017-09-03 DIAGNOSIS — R7989 Other specified abnormal findings of blood chemistry: Secondary | ICD-10-CM

## 2017-09-03 LAB — HEPATIC FUNCTION PANEL
ALBUMIN: 4.4 g/dL (ref 3.5–5.2)
ALK PHOS: 61 U/L (ref 39–117)
ALT: 48 U/L — AB (ref 0–35)
AST: 34 U/L (ref 0–37)
Bilirubin, Direct: 0.1 mg/dL (ref 0.0–0.3)
TOTAL PROTEIN: 7 g/dL (ref 6.0–8.3)
Total Bilirubin: 0.4 mg/dL (ref 0.2–1.2)

## 2017-09-03 NOTE — Telephone Encounter (Signed)
Copied from [place] [id number]. Topic: Quick Communication - Rx Refill/Question >> Sep 03, 2017  9:43 AM Patrice Paradise wrote: Has the patient contacted their pharmacy? Yes.   metFORMIN (GLUCOPHAGE) 500 MG tablet   (Agent: If no, request that the patient contact the pharmacy for the refill.)  Preferred Pharmacy (with phone number or street name):  [place], [place] [address] Phone: [phone] Fax: [phone]   Agent: Please be advised that RX refills may take up to 3 business days. We ask that you follow-up with your pharmacy.

## 2017-09-09 ENCOUNTER — Other Ambulatory Visit: Payer: Self-pay | Admitting: Family Medicine

## 2017-09-09 DIAGNOSIS — R7989 Other specified abnormal findings of blood chemistry: Secondary | ICD-10-CM

## 2017-09-09 DIAGNOSIS — R945 Abnormal results of liver function studies: Principal | ICD-10-CM

## 2017-09-23 ENCOUNTER — Other Ambulatory Visit
Admission: RE | Admit: 2017-09-23 | Discharge: 2017-09-23 | Disposition: A | Discharge status: Home / Self Care | Payer: Medicare Other | Source: Ambulatory Visit | Attending: Gastroenterology | Admitting: Gastroenterology

## 2017-09-23 ENCOUNTER — Encounter: Payer: Self-pay | Admitting: Gastroenterology

## 2017-09-23 ENCOUNTER — Ambulatory Visit (INDEPENDENT_AMBULATORY_CARE_PROVIDER_SITE_OTHER): Payer: Medicare Other | Admitting: Gastroenterology

## 2017-09-23 VITALS — BP 130/74 | HR 81 | Temp 98.1°F | Ht 66.0 in | Wt 266.8 lb

## 2017-09-23 DIAGNOSIS — R945 Abnormal results of liver function studies: Secondary | ICD-10-CM

## 2017-09-23 DIAGNOSIS — R7989 Other specified abnormal findings of blood chemistry: Secondary | ICD-10-CM

## 2017-09-23 LAB — HEPATIC FUNCTION PANEL
ALK PHOS: 58 U/L (ref 38–126)
ALT: 47 U/L (ref 14–54)
AST: 35 U/L (ref 15–41)
Albumin: 3.9 g/dL (ref 3.5–5.0)
BILIRUBIN TOTAL: 0.4 mg/dL (ref 0.3–1.2)
Total Protein: 7 g/dL (ref 6.5–8.1)

## 2017-09-23 LAB — IRON AND TIBC
IRON: 59 ug/dL (ref 28–170)
SATURATION RATIOS: 17 % (ref 10.4–31.8)
TIBC: 342 ug/dL (ref 250–450)
UIBC: 283 ug/dL

## 2017-09-23 LAB — GAMMA GT: GGT: 20 U/L (ref 7–50)

## 2017-09-23 NOTE — Patient Instructions (Addendum)
Today's care was provided by Dr. Jonathon Bellows.  During this visit he has advised the followingf:  1. [facility] to [facility] for Labs to be completed. 2. Ultrasound has been scheduled for [facility] on 01/31 at 10:30 am. Arrive at Registration Desk in [facility] by 10:15am for check in. Do not eat or drink anything six hours prior. 3.  Follow up with Dr. Vicente Males in 6 weeks.  Thanks for choosing [facility].  Have a great day.  Sharyn Lull CMA

## 2017-09-23 NOTE — Progress Notes (Signed)
Jonathon Bellows MD, MRCP(U.K) [address]  [place]  [place], [place] [address]  Main: [phone]  Fax: [phone]   Gastroenterology Consultation  Referring Provider:     Leone Haven, MD Primary Care Physician:  Leone Haven, MD Primary Gastroenterologist:  Dr. Jonathon Bellows  Reason for Consultation:    Abnormal LFT's         HPI:   Martha Woodard is a 51 y.o. y/o female referred for consultation & management  by Dr. Caryl Bis, Angela Adam, MD.     Recent CMP showed mild elevation of ALT at 48 , rest were normal .Normal 1 year back.   First noted abnormality in LFT's in 1 month back  .  Alcohol use no  Drug use none, never  Over the counter herbal supplements multivitamin  New medications : no  Abdominal pain : no  Tattoos : none  Military service no  Prior blood transfusion : none  Incarceration : none  History of travel : none Family history of liver disease : father had alcoholic liver cirrhosis Recent change in weight : yes 7 lbs over a week   She suffers from CHF.     Past Medical History:  Diagnosis Date  . CHF (congestive heart failure) ([place])   . Colon polyp 01-01-2016  . COPD (chronic obstructive pulmonary disease) ([place])   . Diabetes mellitus without complication ([place])   . Diastolic heart failure ([place])   . HLD (hyperlipidemia)   . Hypertension   . Morbid obesity with BMI of 45.0-49.9, adult ([place])   . Multiple lung nodules on CT   . Shortness of breath dyspnea     Past Surgical History:  Procedure Laterality Date  . BIOPSY THYROID Right 02-06-14   NEGATIVE FOR MALIGNANT CELLS proteinaceous material and macrophages.  Marland Kitchen BREAST BIOPSY Left    neg  . COLONOSCOPY WITH PROPOFOL N/A 01/01/2016   Procedure: COLONOSCOPY WITH PROPOFOL;  Surgeon: Lucilla Lame, MD;  Location: [facility] ENDOSCOPY;  Service: Endoscopy;  Laterality: N/A;  . thryoid fna Right April 2015   Proteinaceous material and macrophages.  . WISDOM TOOTH EXTRACTION      Prior to  Admission medications   Medication Sig Start Date End Date Taking? Authorizing Provider  ACCU-CHEK AVIVA PLUS test strip TEST UP TO 4 TIMES A DAY AS DIRECTED 10/17/16  Yes Cook, [place] G, DO  ACCU-CHEK SOFTCLIX LANCETS lancets TEST UP TO 4 TIMES A DAY 10/17/16  Yes Cook, Jayce G, DO  albuterol (PROVENTIL) (2.5 MG/3ML) 0.083% nebulizer solution Take 3 mLs (2.5 mg total) by nebulization every 6 (six) hours as needed for wheezing or shortness of breath. 06/11/15  Yes Daymon Larsen, MD  aspirin EC 81 MG tablet Take 1 tablet (81 mg total) by mouth daily. 02/11/17  Yes Cook, Jayce G, DO  blood glucose meter kit and supplies Dispense based on patient and insurance preference. Use up to four times daily as directed. (FOR ICD-9 250.00, 250.01). 09/17/16  Yes Cook, Jayce G, DO  fluticasone (FLONASE) 50 MCG/ACT nasal spray USE 1 OR 2 SPRAYS IN EACH NOSTRIL ONCE DAILY. 12/19/16  Yes Cook, Jayce G, DO  furosemide (LASIX) 20 MG tablet Take 1 tablet (20 mg total) by mouth daily as needed. 11/22/15  Yes Minna Merritts, MD  hydrochlorothiazide (MICROZIDE) 12.5 MG capsule TAKE 1 CAPSULE BY MOUTH ONCE DAILY 08/03/17  Yes Leone Haven, MD  losartan (COZAAR) 25 MG tablet TAKE 1 TABLET BY MOUTH TWICE DAILY  08/26/17  Yes Leone Haven, MD  metFORMIN (GLUCOPHAGE) 500 MG tablet TAKE 2 TABLETS BY MOUTH TWICE DAILY WITHMEALS 09/03/17  Yes Leone Haven, MD  metoprolol succinate (TOPROL-XL) 25 MG 24 hr tablet TAKE 1 TABLET BY MOUTH ONCE DAILY 04/14/17  Yes Cook, Jayce G, DO  mometasone-formoterol (DULERA) 100-5 MCG/ACT AERO INHALE 2 PUFFS INTO LUNG THE LUNGS TWICEDAILY 05/05/17  Yes Wilhelmina Mcardle, MD  Multiple Vitamin (MULTIVITAMIN) tablet Take 1 tablet by mouth daily.   Yes [provider]  nystatin (MYCOSTATIN) 100000 UNIT/ML suspension Take 5 mLs (500,000 Units total) by mouth 4 (four) times daily. Swish and swallow 12/22/16  Yes Cook, Jayce G, DO  nystatin cream (MYCOSTATIN) Apply 1 application  topically 2 (two) times daily. 09/01/17  Yes Leone Haven, MD  OXYGEN Inhale 2 L into the lungs daily. 2 liters at night, 1 liter when doing activities, and no oxygen when at home during the day.   Yes [provider]  tiotropium (SPIRIVA) 18 MCG inhalation capsule Place 1 capsule (18 mcg total) into inhaler and inhale daily. 05/05/17  Yes Wilhelmina Mcardle, MD  VENTOLIN HFA 108 (90 BASE) MCG/ACT inhaler INHALE 2 PUFFS BY MOUTH 4 TIMES DAILY AS NEEDED. 09/06/15  Yes Arlis Porta., MD  potassium chloride (K-DUR) 10 MEQ tablet Take 1 tablet (10 mEq total) by mouth daily. 10/14/16 08/07/17  Minna Merritts, MD    Family History  Problem Relation Age of Onset  . Cancer Mother 8       uterian and ovarian   . Goiter Mother   . Stroke Father   . COPD Sister   . Breast cancer Sister 20  . Stroke Brother   . Heart murmur Sister      Social History   Tobacco Use  . Smoking status: Former Smoker    Years: 33.00    Last attempt to quit: 07/10/2013    Years since quitting: 4.2  . Smokeless tobacco: Never Used  Substance Use Topics  . Alcohol use: No    Alcohol/week: 0.0 oz  . Drug use: No    Allergies as of 09/23/2017 - Review Complete 09/23/2017  Allergen Reaction Noted  . Augmentin [amoxicillin-pot clavulanate] Itching 01/18/2014    Review of Systems:    All systems reviewed and negative except where noted in HPI.   Physical Exam:  BP 130/74   Pulse 81   Temp 98.1 F (36.7 C) (Oral)   Ht 5' 6"  (1.676 m)   Wt 266 lb 12.8 oz (121 kg)   BMI 43.06 kg/m  No LMP recorded. Patient is not currently having periods (Reason: Perimenopausal). Psych:  Alert and cooperative. Normal mood and affect.On portable oxygen in a cylinger  General:   Alert,  Well-developed, well-nourished, pleasant and cooperative in NAD Head:  Normocephalic and atraumatic. Eyes:  Sclera clear, no icterus.   Conjunctiva pink. Ears:  Normal auditory acuity. Nose:  No deformity, discharge, or  lesions. Mouth:  No deformity or lesions,oropharynx pink & moist. Neck:  Supple; no masses or thyromegaly. Lungs:  Respirations even and unlabored.  Clear throughout to auscultation.   No wheezes, crackles, or rhonchi. No acute distress. Heart:  Regular rate and rhythm; no murmurs, clicks, rubs, or gallops. Abdomen:  Normal bowel sounds.  No bruits.  Soft, non-tender and non-distended without masses, hepatosplenomegaly or hernias noted.  No guarding or rebound tenderness.    Neurologic:  Alert and oriented x3;  grossly normal neurologically. Extremeties:  B/l pedal pitting edema  Skin:  Intact without significant lesions or rashes. No jaundice. Lymph Nodes:  No significant cervical adenopathy. Psych:  Alert and cooperative. Normal mood and affect.  Imaging Studies: No results found.  Assessment and Plan:   Martha Woodard is a 51 y.o. y/o female has been referred for mild elevation of ALT. Diferentials include a fatty liver vs congestion on heart failure . I will r/o viral hepatitis, autoimmune liver disease, Primary Biliary cirrhosis, celiac disease, muscle disorders,Primary Sclerosing Cholangitis, Hemachromatosis, Wilson's disease, or A-1 antitrypsin deficiency.  If needed can continue to use a statin with monitoring LFT's , usually they resolve with continued use of the statin and the benfit of a statin outweighs its risk.   Follow up in 6 weeks   Dr Jonathon Bellows MD,MRCP(U.K)

## 2017-09-24 LAB — ANA W/REFLEX: ANA: NEGATIVE

## 2017-09-24 LAB — HIV ANTIBODY (ROUTINE TESTING W REFLEX): HIV Screen 4th Generation wRfx: NONREACTIVE

## 2017-09-24 LAB — ALPHA-1 ANTITRYPSIN PHENOTYPE: A1 ANTITRYPSIN SER: 121 mg/dL (ref 90–200)

## 2017-09-24 LAB — HEPATITIS B CORE ANTIBODY, TOTAL: Hep B Core Total Ab: NEGATIVE

## 2017-09-24 LAB — HEPATITIS C ANTIBODY: HCV Ab: <0.1 s/co ratio (ref 0.0–0.9)

## 2017-09-24 LAB — HEPATITIS B SURFACE ANTIBODY, QUANTITATIVE: Hepatitis B-Post: 421.6 m[IU]/mL (ref >9.9)

## 2017-09-24 LAB — ANTI-SMOOTH MUSCLE ANTIBODY, IGG: F-ACTIN AB IGG: 13 U (ref 0–19)

## 2017-09-24 LAB — HEPATITIS A ANTIBODY, TOTAL: HEP A TOTAL AB: NEGATIVE

## 2017-09-24 LAB — CERULOPLASMIN: CERULOPLASMIN: 27.4 mg/dL (ref 19.0–39.0)

## 2017-09-24 LAB — HEPATITIS B SURFACE ANTIGEN: HEP B S AG: NEGATIVE

## 2017-09-24 LAB — MITOCHONDRIAL ANTIBODIES: MITOCHONDRIAL M2 AB, IGG: 4.8 U (ref 0.0–20.0)

## 2017-09-25 LAB — CELIAC PANEL 10
ANTIGLIADIN ABS, IGA: 7 U (ref 0–19)
Endomysial Ab, IgA: NEGATIVE
GLIADIN IGG: 3 U (ref 0–19)
IgA: 157 mg/dL (ref 87–352)
Tissue Transglut Ab: <2 U/mL (ref 0–5)
Tissue Transglutaminase Ab, IgA: <2 U/mL (ref 0–3)

## 2017-09-25 LAB — IMMUNOGLOBULINS A/E/G/M, SERUM
IgA: 164 mg/dL (ref 87–352)
IgE (Immunoglobulin E), Serum: 63 IU/mL (ref 0–100)
IgG (Immunoglobin G), Serum: 850 mg/dL (ref 700–1600)
IgM (Immunoglobulin M), Srm: 21 mg/dL — ABNORMAL LOW (ref 26–217)

## 2017-09-28 ENCOUNTER — Ambulatory Visit
Admission: RE | Admit: 2017-09-28 | Discharge: 2017-09-28 | Disposition: A | Discharge status: Home / Self Care | Payer: Medicare Other | Source: Ambulatory Visit | Attending: Gastroenterology | Admitting: Gastroenterology

## 2017-09-28 DIAGNOSIS — K8020 Calculus of gallbladder without cholecystitis without obstruction: Secondary | ICD-10-CM | POA: Diagnosis not present

## 2017-09-28 DIAGNOSIS — K760 Fatty (change of) liver, not elsewhere classified: Secondary | ICD-10-CM | POA: Diagnosis not present

## 2017-09-28 DIAGNOSIS — R945 Abnormal results of liver function studies: Secondary | ICD-10-CM

## 2017-09-28 DIAGNOSIS — R7989 Other specified abnormal findings of blood chemistry: Secondary | ICD-10-CM | POA: Diagnosis not present

## 2017-09-30 ENCOUNTER — Telehealth: Payer: Self-pay

## 2017-09-30 DIAGNOSIS — J449 Chronic obstructive pulmonary disease, unspecified: Secondary | ICD-10-CM | POA: Diagnosis not present

## 2017-09-30 NOTE — Telephone Encounter (Signed)
LVM for patient callback for results per Dr. Vicente Males.    - Hepatic steatosis and gall stones (2.15mm small)   - LFT' normal and work up labs so far negative

## 2017-10-13 DIAGNOSIS — E119 Type 2 diabetes mellitus without complications: Secondary | ICD-10-CM | POA: Diagnosis not present

## 2017-10-31 DIAGNOSIS — J449 Chronic obstructive pulmonary disease, unspecified: Secondary | ICD-10-CM | POA: Diagnosis not present

## 2017-11-05 ENCOUNTER — Ambulatory Visit: Payer: Medicare Other | Admitting: Gastroenterology

## 2017-11-09 ENCOUNTER — Encounter: Payer: Self-pay | Admitting: Family Medicine

## 2017-11-09 ENCOUNTER — Ambulatory Visit (INDEPENDENT_AMBULATORY_CARE_PROVIDER_SITE_OTHER): Payer: Medicare Other | Admitting: Family Medicine

## 2017-11-09 ENCOUNTER — Other Ambulatory Visit: Payer: Self-pay

## 2017-11-09 VITALS — BP 120/70 | HR 80 | Temp 98.8°F | Wt 251.2 lb

## 2017-11-09 DIAGNOSIS — I872 Venous insufficiency (chronic) (peripheral): Secondary | ICD-10-CM

## 2017-11-09 DIAGNOSIS — I10 Essential (primary) hypertension: Secondary | ICD-10-CM | POA: Diagnosis not present

## 2017-11-09 DIAGNOSIS — E782 Mixed hyperlipidemia: Secondary | ICD-10-CM | POA: Diagnosis not present

## 2017-11-09 DIAGNOSIS — E1159 Type 2 diabetes mellitus with other circulatory complications: Secondary | ICD-10-CM

## 2017-11-09 DIAGNOSIS — Z6841 Body Mass Index (BMI) 40.0 and over, adult: Secondary | ICD-10-CM

## 2017-11-09 DIAGNOSIS — J309 Allergic rhinitis, unspecified: Secondary | ICD-10-CM | POA: Insufficient documentation

## 2017-11-09 NOTE — Assessment & Plan Note (Signed)
Some symptoms.  Discussed continuing her current regimen.  Offered Singulair though did discuss the potential for neuropsychiatric effects related to this medication.  She opted to defer at this time.

## 2017-11-09 NOTE — Assessment & Plan Note (Signed)
Congratulated on weight loss.  She will continue dietary changes.

## 2017-11-09 NOTE — Progress Notes (Signed)
[name], MD Phone: [phone]  Martha Woodard is a 52 y.o. female who presents today for f/u.  HYPERTENSION  Disease Monitoring  Home BP Monitoring not checking Chest pain- no    Dyspnea- no Medications  Compliance-  Taking hctz, lasix, losartan, metoprolol.  Edema- no  DIABETES Disease Monitoring: Blood Sugar ranges-94-114 Polyuria/phagia/dipsia- notes occasional dry mouth      Optho-saw last month Medications: Compliance- taking metformin Hypoglycemic symptoms- rare if she does not eat, resolves with food  Obesity: She has changed her diet drastically.  Eating eggs and toast for breakfast.  Salad with lots of vegetables and very little dressing at lunch.  Homemade soup at dinner.  Down 15 pounds.  Hyperlipidemia: She had quite a bit of leg cramps with the atorvastatin.  She has discontinued this and those went away.  She tried fish oil pills in the past though thinks she may have had a reaction where she got short of breath with those.  Those symptoms resolved after stopping the fish oil pills.  Continues to have venous insufficiency right slightly greater than left with hemosiderin deposition changes.  Does report some intermittent allergic rhinitis symptoms with stuffy nose, itchy watery eyes, and sneezing.  She takes Flonase and an over-the-counter allergy medication with some benefit.  She wonders about Singulair.  Social History   Tobacco Use  Smoking Status Former Smoker  . Years: 33.00  . Last attempt to quit: 07/10/2013  . Years since quitting: 4.3  Smokeless Tobacco Never Used     ROS see history of present illness  Objective  Physical Exam Vitals:   11/09/17 1013  BP: 120/70  Pulse: 80  Temp: 98.8 F (37.1 C)  SpO2: 96%    BP Readings from Last 3 Encounters:  11/09/17 120/70  09/23/17 130/74  08/07/17 130/76   Wt Readings from Last 3 Encounters:  11/09/17 251 lb 3.2 oz (113.9 kg)  09/23/17 266 lb 12.8 oz (121 kg)  08/07/17 262 lb  (118.8 kg)    Physical Exam  Constitutional: No distress.  Cardiovascular: Normal rate, regular rhythm and normal heart sounds.  Pulmonary/Chest: Effort normal and breath sounds normal.  Musculoskeletal: She exhibits edema (Trace edema right slightly greater than left lower extremities, hemosiderin deposition changes bilaterally lower extremities).  Neurological: She is alert. Gait normal.  Skin: Skin is warm and dry. She is not diaphoretic.     Assessment/Plan: Please see individual problem list.  Essential hypertension Well-controlled.  Continue current regimen.  She will return for fasting labs.  Venous insufficiency Stable.  Encouraged propping her legs up.  Could consider compression hose as well.  DM type 2 (diabetes mellitus, type 2) ([place]) Return for lab work.  Continue metformin.  Morbid obesity with BMI of 45.0-49.9, adult ([place]) Congratulated on weight loss.  She will continue dietary changes.  Hyperlipidemia She has changed her diet drastically.  She would like to avoid medication if possible.  We will have her return for fasting labs.  Allergic rhinitis Some symptoms.  Discussed continuing her current regimen.  Offered Singulair though did discuss the potential for neuropsychiatric effects related to this medication.  She opted to defer at this time.   Orders Placed This Encounter  Procedures  . Lipid panel    Standing Status:   Future    Standing Expiration Date:   11/09/2018  . Comp Met (CMET)    Standing Status:   Future    Standing Expiration Date:   11/09/2018  . HgB  A1c    Standing Status:   Future    Standing Expiration Date:   11/09/2018    No orders of the defined types were placed in this encounter.    [name], MD [place]

## 2017-11-09 NOTE — Assessment & Plan Note (Signed)
She has changed her diet drastically.  She would like to avoid medication if possible.  We will have her return for fasting labs.

## 2017-11-09 NOTE — Assessment & Plan Note (Signed)
Well-controlled.  Continue current regimen.  She will return for fasting labs.

## 2017-11-09 NOTE — Assessment & Plan Note (Signed)
Return for lab work.  Continue metformin.

## 2017-11-09 NOTE — Patient Instructions (Signed)
Nice to see you. We will have you return for fasting lab work. Please continue to watch your diet.

## 2017-11-09 NOTE — Assessment & Plan Note (Addendum)
Stable.  Encouraged propping her legs up.  Could consider compression hose as well.

## 2017-11-16 ENCOUNTER — Other Ambulatory Visit: Payer: Self-pay | Admitting: Cardiovascular Disease

## 2017-11-17 ENCOUNTER — Other Ambulatory Visit (INDEPENDENT_AMBULATORY_CARE_PROVIDER_SITE_OTHER): Payer: Medicare Other

## 2017-11-17 DIAGNOSIS — E782 Mixed hyperlipidemia: Secondary | ICD-10-CM

## 2017-11-17 DIAGNOSIS — E1159 Type 2 diabetes mellitus with other circulatory complications: Secondary | ICD-10-CM

## 2017-11-17 DIAGNOSIS — I10 Essential (primary) hypertension: Secondary | ICD-10-CM | POA: Diagnosis not present

## 2017-11-17 LAB — COMPREHENSIVE METABOLIC PANEL
ALT: 35 U/L (ref 0–35)
AST: 22 U/L (ref 0–37)
Albumin: 4 g/dL (ref 3.5–5.2)
Alkaline Phosphatase: 46 U/L (ref 39–117)
BUN: 13 mg/dL (ref 6–23)
CHLORIDE: 99 meq/L (ref 96–112)
CO2: 35 meq/L — AB (ref 19–32)
CREATININE: 0.63 mg/dL (ref 0.40–1.20)
Calcium: 9.4 mg/dL (ref 8.4–10.5)
GFR: 104.99 mL/min (ref >60.00)
Glucose, Bld: 106 mg/dL — ABNORMAL HIGH (ref 70–99)
Potassium: 4 mEq/L (ref 3.5–5.1)
Sodium: 139 mEq/L (ref 135–145)
Total Bilirubin: 0.4 mg/dL (ref 0.2–1.2)
Total Protein: 6.8 g/dL (ref 6.0–8.3)

## 2017-11-17 LAB — LIPID PANEL
CHOL/HDL RATIO: 5
Cholesterol: 181 mg/dL (ref 0–200)
HDL: 39 mg/dL — ABNORMAL LOW (ref >39.00)
LDL CALC: 113 mg/dL — AB (ref 0–99)
NonHDL: 141.66
TRIGLYCERIDES: 141 mg/dL (ref 0.0–149.0)
VLDL: 28.2 mg/dL (ref 0.0–40.0)

## 2017-11-17 LAB — HEMOGLOBIN A1C: Hgb A1c MFr Bld: 6 % (ref 4.6–6.5)

## 2017-11-28 DIAGNOSIS — J449 Chronic obstructive pulmonary disease, unspecified: Secondary | ICD-10-CM | POA: Diagnosis not present

## 2017-12-16 ENCOUNTER — Other Ambulatory Visit: Payer: Self-pay | Admitting: Family Medicine

## 2017-12-16 DIAGNOSIS — I491 Atrial premature depolarization: Secondary | ICD-10-CM

## 2017-12-24 ENCOUNTER — Encounter: Payer: Self-pay | Admitting: Family Medicine

## 2017-12-24 ENCOUNTER — Ambulatory Visit (INDEPENDENT_AMBULATORY_CARE_PROVIDER_SITE_OTHER): Payer: Medicare Other | Admitting: Family Medicine

## 2017-12-24 VITALS — BP 126/70 | HR 75 | Temp 98.6°F | Ht 66.0 in | Wt 246.2 lb

## 2017-12-24 DIAGNOSIS — Z0000 Encounter for general adult medical examination without abnormal findings: Secondary | ICD-10-CM | POA: Diagnosis not present

## 2017-12-24 NOTE — Patient Instructions (Addendum)
Please continue to work on diet and exercise.  We will contact you once we hear from our pharmacist.   Fat and Cholesterol Restricted Diet Getting too much fat and cholesterol in your diet may cause health problems. Following this diet helps keep your fat and cholesterol at normal levels. This can keep you from getting sick. What types of fat should I choose?  Choose monosaturated and polyunsaturated fats. These are found in foods such as olive oil, canola oil, flaxseeds, walnuts, almonds, and seeds.  Eat more omega-3 fats. Good choices include salmon, mackerel, sardines, tuna, flaxseed oil, and ground flaxseeds.  Limit saturated fats. These are in animal products such as meats, butter, and cream. They can also be in plant products such as palm oil, palm kernel oil, and coconut oil.  Avoid foods with partially hydrogenated oils in them. These contain trans fats. Examples of foods that have trans fats are stick margarine, some tub margarines, cookies, crackers, and other baked goods. What general guidelines do I need to follow?  Check food labels. Look for the words "trans fat" and "saturated fat."  When preparing a meal: ? Fill half of your plate with vegetables and green salads. ? Fill one fourth of your plate with whole grains. Look for the word "whole" as the first word in the ingredient list. ? Fill one fourth of your plate with lean protein foods.  Eat more foods that have fiber, like apples, carrots, beans, peas, and barley.  Eat more home-cooked foods. Eat less at restaurants and buffets.  Limit or avoid alcohol.  Limit foods high in starch and sugar.  Limit fried foods.  Cook foods without frying them. Baking, boiling, grilling, and broiling are all great options.  Lose weight if you are overweight. Losing even a small amount of weight can help your overall health. It can also help prevent diseases such as diabetes and heart disease. What foods can I eat? Grains Whole  grains, such as whole wheat or whole grain breads, crackers, cereals, and pasta. Unsweetened oatmeal, bulgur, barley, quinoa, or brown rice. Corn or whole wheat flour tortillas. Vegetables Fresh or frozen vegetables (raw, steamed, roasted, or grilled). Green salads. Fruits All fresh, canned (in natural juice), or frozen fruits. Meat and Other Protein Products Ground beef (85% or leaner), grass-fed beef, or beef trimmed of fat. Skinless chicken or [place]. Ground chicken or [place]. Pork trimmed of fat. All fish and seafood. Eggs. Dried beans, peas, or lentils. Unsalted nuts or seeds. Unsalted canned or dry beans. Dairy Low-fat dairy products, such as skim or 1% milk, 2% or reduced-fat cheeses, low-fat ricotta or cottage cheese, or plain low-fat yogurt. Fats and Oils Tub margarines without trans fats. Light or reduced-fat mayonnaise and salad dressings. Avocado. Olive, canola, sesame, or safflower oils. Natural peanut or almond butter (choose ones without added sugar and oil). The items listed above may not be a complete list of recommended foods or beverages. Contact your dietitian for more options. What foods are not recommended? Grains White bread. White pasta. White rice. Cornbread. Bagels, pastries, and croissants. Crackers that contain trans fat. Vegetables White potatoes. Corn. Creamed or fried vegetables. Vegetables in a cheese sauce. Fruits Dried fruits. Canned fruit in light or heavy syrup. Fruit juice. Meat and Other Protein Products Fatty cuts of meat. Ribs, chicken wings, bacon, sausage, bologna, salami, chitterlings, fatback, hot dogs, bratwurst, and packaged luncheon meats. Liver and organ meats. Dairy Whole or 2% milk, cream, half-and-half, and cream cheese. Whole milk cheeses. Whole-fat  or sweetened yogurt. Full-fat cheeses. Nondairy creamers and whipped toppings. Processed cheese, cheese spreads, or cheese curds. Sweets and Desserts Corn syrup, sugars, honey, and molasses.  Candy. Jam and jelly. Syrup. Sweetened cereals. Cookies, pies, cakes, donuts, muffins, and ice cream. Fats and Oils Butter, stick margarine, lard, shortening, ghee, or bacon fat. Coconut, palm kernel, or palm oils. Beverages Alcohol. Sweetened drinks (such as sodas, lemonade, and fruit drinks or punches). The items listed above may not be a complete list of foods and beverages to avoid. Contact your dietitian for more information. This information is not intended to replace advice given to you by your health care provider. Make sure you discuss any questions you have with your health care provider. Document Released: 03/16/2012 Document Revised: 05/22/2016 Document Reviewed: 12/15/2013 Elsevier Interactive Patient Education  2018 [place] in the Home Falls can cause injuries. They can happen to people of all ages. There are many things you can do to make your home safe and to help prevent falls. What can I do on the outside of my home?  Regularly fix the edges of walkways and driveways and fix any cracks.  Remove anything that might make you trip as you walk through a door, such as a raised step or threshold.  Trim any bushes or trees on the path to your home.  Use bright outdoor lighting.  Clear any walking paths of anything that might make someone trip, such as rocks or tools.  Regularly check to see if handrails are loose or broken. Make sure that both sides of any steps have handrails.  Any raised decks and porches should have guardrails on the edges.  Have any leaves, snow, or ice cleared regularly.  Use sand or salt on walking paths during winter.  Clean up any spills in your garage right away. This includes oil or grease spills. What can I do in the bathroom?  Use night lights.  Install grab bars by the toilet and in the tub and shower. Do not use towel bars as grab bars.  Use non-skid mats or decals in the tub or shower.  If you need to sit  down in the shower, use a plastic, non-slip stool.  Keep the floor dry. Clean up any water that spills on the floor as soon as it happens.  Remove soap buildup in the tub or shower regularly.  Attach bath mats securely with double-sided non-slip rug tape.  Do not have throw rugs and other things on the floor that can make you trip. What can I do in the bedroom?  Use night lights.  Make sure that you have a light by your bed that is easy to reach.  Do not use any sheets or blankets that are too big for your bed. They should not hang down onto the floor.  Have a firm chair that has side arms. You can use this for support while you get dressed.  Do not have throw rugs and other things on the floor that can make you trip. What can I do in the kitchen?  Clean up any spills right away.  Avoid walking on wet floors.  Keep items that you use a lot in easy-to-reach places.  If you need to reach something above you, use a strong step stool that has a grab bar.  Keep electrical cords out of the way.  Do not use floor polish or wax that makes floors slippery. If you must use wax, use  non-skid floor wax.  Do not have throw rugs and other things on the floor that can make you trip. What can I do with my stairs?  Do not leave any items on the stairs.  Make sure that there are handrails on both sides of the stairs and use them. Fix handrails that are broken or loose. Make sure that handrails are as long as the stairways.  Check any carpeting to make sure that it is firmly attached to the stairs. Fix any carpet that is loose or worn.  Avoid having throw rugs at the top or bottom of the stairs. If you do have throw rugs, attach them to the floor with carpet tape.  Make sure that you have a light switch at the top of the stairs and the bottom of the stairs. If you do not have them, ask someone to add them for you. What else can I do to help prevent falls?  Wear shoes that: ? Do not have  high heels. ? Have rubber bottoms. ? Are comfortable and fit you well. ? Are closed at the toe. Do not wear sandals.  If you use a stepladder: ? Make sure that it is fully opened. Do not climb a closed stepladder. ? Make sure that both sides of the stepladder are locked into place. ? Ask someone to hold it for you, if possible.  Clearly mark and make sure that you can see: ? Any grab bars or handrails. ? First and last steps. ? Where the edge of each step is.  Use tools that help you move around (mobility aids) if they are needed. These include: ? Canes. ? Walkers. ? Scooters. ? Crutches.  Turn on the lights when you go into a dark area. Replace any light bulbs as soon as they burn out.  Set up your furniture so you have a clear path. Avoid moving your furniture around.  If any of your floors are uneven, fix them.  If there are any pets around you, be aware of where they are.  Review your medicines with your doctor. Some medicines can make you feel dizzy. This can increase your chance of falling. Ask your doctor what other things that you can do to help prevent falls. This information is not intended to replace advice given to you by your health care provider. Make sure you discuss any questions you have with your health care provider. Document Released: 07/12/2009 Document Revised: 02/21/2016 Document Reviewed: 10/20/2014 Elsevier Interactive Patient Education  2018 [place] Maintenance, Female Adopting a healthy lifestyle and getting preventive care can go a long way to promote health and wellness. Talk with your health care provider about what schedule of regular examinations is right for you. This is a good chance for you to check in with your provider about disease prevention and staying healthy. In between checkups, there are plenty of things you can do on your own. Experts have done a lot of research about which lifestyle changes and preventive measures are  most likely to keep you healthy. Ask your health care provider for more information. Weight and diet Eat a healthy diet  Be sure to include plenty of vegetables, fruits, low-fat dairy products, and lean protein.  Do not eat a lot of foods high in solid fats, added sugars, or salt.  Get regular exercise. This is one of the most important things you can do for your health. ? Most adults should exercise for at least 150 minutes  each week. The exercise should increase your heart rate and make you sweat (moderate-intensity exercise). ? Most adults should also do strengthening exercises at least twice a week. This is in addition to the moderate-intensity exercise.  Maintain a healthy weight  Body mass index (BMI) is a measurement that can be used to identify possible weight problems. It estimates body fat based on height and weight. Your health care provider can help determine your BMI and help you achieve or maintain a healthy weight.  For females 33 years of age and older: ? A BMI below 18.5 is considered underweight. ? A BMI of 18.5 to 24.9 is normal. ? A BMI of 25 to 29.9 is considered overweight. ? A BMI of 30 and above is considered obese.  Watch levels of cholesterol and blood lipids  You should start having your blood tested for lipids and cholesterol at 54 years of age, then have this test every 5 years.  You may need to have your cholesterol levels checked more often if: ? Your lipid or cholesterol levels are high. ? You are older than 54 years of age. ? You are at high risk for heart disease.  Cancer screening Lung Cancer  Lung cancer screening is recommended for adults 39-48 years old who are at high risk for lung cancer because of a history of smoking.  A yearly low-dose CT scan of the lungs is recommended for people who: ? Currently smoke. ? Have quit within the past 15 years. ? Have at least a 30-pack-year history of smoking. A pack year is smoking an average of one  pack of cigarettes a day for 1 year.  Yearly screening should continue until it has been 15 years since you quit.  Yearly screening should stop if you develop a health problem that would prevent you from having lung cancer treatment.  Breast Cancer  Practice breast self-awareness. This means understanding how your breasts normally appear and feel.  It also means doing regular breast self-exams. Let your health care provider know about any changes, no matter how small.  If you are in your 20s or 30s, you should have a clinical breast exam (CBE) by a health care provider every 1-3 years as part of a regular health exam.  If you are 23 or older, have a CBE every year. Also consider having a breast X-ray (mammogram) every year.  If you have a family history of breast cancer, talk to your health care provider about genetic screening.  If you are at high risk for breast cancer, talk to your health care provider about having an MRI and a mammogram every year.  Breast cancer gene (BRCA) assessment is recommended for women who have family members with BRCA-related cancers. BRCA-related cancers include: ? Breast. ? Ovarian. ? Tubal. ? Peritoneal cancers.  Results of the assessment will determine the need for genetic counseling and BRCA1 and BRCA2 testing.  Cervical Cancer Your health care provider may recommend that you be screened regularly for cancer of the pelvic organs (ovaries, uterus, and vagina). This screening involves a pelvic examination, including checking for microscopic changes to the surface of your cervix (Pap test). You may be encouraged to have this screening done every 3 years, beginning at age 36.  For women ages 25-65, health care providers may recommend pelvic exams and Pap testing every 3 years, or they may recommend the Pap and pelvic exam, combined with testing for human papilloma virus (HPV), every 5 years. Some types of HPV  increase your risk of cervical cancer. Testing  for HPV may also be done on women of any age with unclear Pap test results.  Other health care providers may not recommend any screening for nonpregnant women who are considered low risk for pelvic cancer and who do not have symptoms. Ask your health care provider if a screening pelvic exam is right for you.  If you have had past treatment for cervical cancer or a condition that could lead to cancer, you need Pap tests and screening for cancer for at least 20 years after your treatment. If Pap tests have been discontinued, your risk factors (such as having a new sexual partner) need to be reassessed to determine if screening should resume. Some women have medical problems that increase the chance of getting cervical cancer. In these cases, your health care provider may recommend more frequent screening and Pap tests.  Colorectal Cancer  This type of cancer can be detected and often prevented.  Routine colorectal cancer screening usually begins at 54 years of age and continues through 54 years of age.  Your health care provider may recommend screening at an earlier age if you have risk factors for colon cancer.  Your health care provider may also recommend using home test kits to check for hidden blood in the stool.  A small camera at the end of a tube can be used to examine your colon directly (sigmoidoscopy or colonoscopy). This is done to check for the earliest forms of colorectal cancer.  Routine screening usually begins at age 78.  Direct examination of the colon should be repeated every 5-10 years through 54 years of age. However, you may need to be screened more often if early forms of precancerous polyps or small growths are found.  Skin Cancer  Check your skin from head to toe regularly.  Tell your health care provider about any new moles or changes in moles, especially if there is a change in a mole's shape or color.  Also tell your health care provider if you have a mole that is  larger than the size of a pencil eraser.  Always use sunscreen. Apply sunscreen liberally and repeatedly throughout the day.  Protect yourself by wearing long sleeves, pants, a wide-brimmed hat, and sunglasses whenever you are outside.  Heart disease, diabetes, and high blood pressure  High blood pressure causes heart disease and increases the risk of stroke. High blood pressure is more likely to develop in: ? People who have blood pressure in the high end of the normal range (130-139/85-89 mm Hg). ? People who are overweight or obese. ? People who are African American.  If you are 36-2 years of age, have your blood pressure checked every 3-5 years. If you are 41 years of age or older, have your blood pressure checked every year. You should have your blood pressure measured twice-once when you are at a hospital or clinic, and once when you are not at a hospital or clinic. Record the average of the two measurements. To check your blood pressure when you are not at a hospital or clinic, you can use: ? An automated blood pressure machine at a pharmacy. ? A home blood pressure monitor.  If you are between 65 years and 72 years old, ask your health care provider if you should take aspirin to prevent strokes.  Have regular diabetes screenings. This involves taking a blood sample to check your fasting blood sugar level. ? If you are at a  normal weight and have a low risk for diabetes, have this test once every three years after 54 years of age. ? If you are overweight and have a high risk for diabetes, consider being tested at a younger age or more often. Preventing infection Hepatitis B  If you have a higher risk for hepatitis B, you should be screened for this virus. You are considered at high risk for hepatitis B if: ? You were born in a country where hepatitis B is common. Ask your health care provider which countries are considered high risk. ? Your parents were born in a high-risk country,  and you have not been immunized against hepatitis B (hepatitis B vaccine). ? You have HIV or AIDS. ? You use needles to inject street drugs. ? You live with someone who has hepatitis B. ? You have had sex with someone who has hepatitis B. ? You get hemodialysis treatment. ? You take certain medicines for conditions, including cancer, organ transplantation, and autoimmune conditions.  Hepatitis C  Blood testing is recommended for: ? Everyone born from [birth year] through 1967. ? Anyone with known risk factors for hepatitis C.  Sexually transmitted infections (STIs)  You should be screened for sexually transmitted infections (STIs) including gonorrhea and chlamydia if: ? You are sexually active and are younger than 54 years of age. ? You are older than 54 years of age and your health care provider tells you that you are at risk for this type of infection. ? Your sexual activity has changed since you were last screened and you are at an increased risk for chlamydia or gonorrhea. Ask your health care provider if you are at risk.  If you do not have HIV, but are at risk, it may be recommended that you take a prescription medicine daily to prevent HIV infection. This is called pre-exposure prophylaxis (PrEP). You are considered at risk if: ? You are sexually active and do not regularly use condoms or know the HIV status of your partner(s). ? You take drugs by injection. ? You are sexually active with a partner who has HIV.  Talk with your health care provider about whether you are at high risk of being infected with HIV. If you choose to begin PrEP, you should first be tested for HIV. You should then be tested every 3 months for as long as you are taking PrEP. Pregnancy  If you are premenopausal and you may become pregnant, ask your health care provider about preconception counseling.  If you may become pregnant, take 400 to 800 micrograms (mcg) of folic acid every day.  If you want to prevent  pregnancy, talk to your health care provider about birth control (contraception). Osteoporosis and menopause  Osteoporosis is a disease in which the bones lose minerals and strength with aging. This can result in serious bone fractures. Your risk for osteoporosis can be identified using a bone density scan.  If you are 39 years of age or older, or if you are at risk for osteoporosis and fractures, ask your health care provider if you should be screened.  Ask your health care provider whether you should take a calcium or vitamin D supplement to lower your risk for osteoporosis.  Menopause may have certain physical symptoms and risks.  Hormone replacement therapy may reduce some of these symptoms and risks. Talk to your health care provider about whether hormone replacement therapy is right for you. Follow these instructions at home:  Schedule regular health, dental, and  eye exams.  Stay current with your immunizations.  Do not use any tobacco products including cigarettes, chewing tobacco, or electronic cigarettes.  If you are pregnant, do not drink alcohol.  If you are breastfeeding, limit how much and how often you drink alcohol.  Limit alcohol intake to no more than 1 drink per day for nonpregnant women. One drink equals 12 ounces of beer, 5 ounces of wine, or 1 ounces of hard liquor.  Do not use street drugs.  Do not share needles.  Ask your health care provider for help if you need support or information about quitting drugs.  Tell your health care provider if you often feel depressed.  Tell your health care provider if you have ever been abused or do not feel safe at home. This information is not intended to replace advice given to you by your health care provider. Make sure you discuss any questions you have with your health care provider. Document Released: 03/31/2011 Document Revised: 02/21/2016 Document Reviewed: 06/19/2015 Elsevier Interactive Patient Education  [employer].

## 2017-12-24 NOTE — [facility] Notes (Signed)
Subjective:   Martha Woodard is a 51 y.o. female who presents for an Initial Medicare Annual Wellness Visit.  Review of Systems    Not done due to wellness visit  Cardiac Risk Factors include: obesity (BMI >30kg/m2);sedentary lifestyle;diabetes mellitus;hypertension;dyslipidemia     Objective:    Today's Vitals   12/24/17 0854  BP: 126/70  Pulse: 75  Temp: 98.6 F (37 C)  TempSrc: Oral  SpO2: 94%  Weight: 246 lb 3.2 oz (111.7 kg)  Height: _0  (1.676 m)   Body mass index is 39.74 kg/m.  Advanced Directives 12/24/2017 09/25/2015 09/25/2015  Does Patient Have a Medical Advance Directive? No No No  Would patient like information on creating a medical advance directive? Yes (MAU/Ambulatory/Procedural Areas - Information given) Yes - Spiritual care consult ordered -    Current Medications (verified) Outpatient Encounter Medications as of 12/24/2017  Medication Sig  . ACCU-CHEK AVIVA PLUS test strip TEST UP TO 4 TIMES A DAY AS DIRECTED  . ACCU-CHEK SOFTCLIX LANCETS lancets TEST UP TO 4 TIMES A DAY  . albuterol (PROVENTIL) (2.5 MG/3ML) 0.083% nebulizer solution Take 3 mLs (2.5 mg total) by nebulization every 6 (six) hours as needed for wheezing or shortness of breath.  Marland Kitchen aspirin EC 81 MG tablet Take 1 tablet (81 mg total) by mouth daily.  . blood glucose meter kit and supplies Dispense based on patient and insurance preference. Use up to four times daily as directed. (FOR ICD-9 250.00, 250.01).  . fluticasone (FLONASE) 50 MCG/ACT nasal spray USE 1 OR 2 SPRAYS IN EACH NOSTRIL ONCE DAILY.  . furosemide (LASIX) 20 MG tablet Take 1 tablet (20 mg total) by mouth daily as needed.  . hydrochlorothiazide (MICROZIDE) 12.5 MG capsule TAKE 1 CAPSULE BY MOUTH ONCE DAILY  . losartan (COZAAR) 25 MG tablet TAKE 1 TABLET BY MOUTH TWICE DAILY  . metFORMIN (GLUCOPHAGE) 500 MG tablet TAKE 2 TABLETS BY MOUTH TWICE DAILY WITHMEALS  . mometasone-formoterol (DULERA) 100-5 MCG/ACT AERO INHALE 2 PUFFS  INTO LUNG THE LUNGS TWICEDAILY  . Multiple Vitamin (MULTIVITAMIN) tablet Take 1 tablet by mouth daily.  Marland Kitchen nystatin (MYCOSTATIN) 100000 UNIT/ML suspension Take 5 mLs (500,000 Units total) by mouth 4 (four) times daily. Swish and swallow  . nystatin cream (MYCOSTATIN) Apply 1 application topically 2 (two) times daily.  . OXYGEN Inhale 2 L into the lungs daily. 2 liters at night, 1 liter when doing activities, and no oxygen when at home during the day.  . potassium chloride (K-DUR) 10 MEQ tablet TAKE 1 TABLET BY MOUTH ONCE DAILY  . tiotropium (SPIRIVA) 18 MCG inhalation capsule Place 1 capsule (18 mcg total) into inhaler and inhale daily.  . VENTOLIN HFA 108 (90 BASE) MCG/ACT inhaler INHALE 2 PUFFS BY MOUTH 4 TIMES DAILY AS NEEDED.  Marland Kitchen atorvastatin (LIPITOR) 10 MG tablet TAKE 1 TABLET BY MOUTH ONCE DAILY (Patient not taking: Reported on 12/24/2017)  . metoprolol succinate (TOPROL-XL) 25 MG 24 hr tablet TAKE 1 TABLET BY MOUTH ONCE DAILY   No facility-administered encounter medications on file as of 12/24/2017.     Allergies (verified) Augmentin [amoxicillin-pot clavulanate]   History: Past Medical History:  Diagnosis Date  . CHF (congestive heart failure) ([place])   . Colon polyp 01-01-2016  . COPD (chronic obstructive pulmonary disease) ([place])   . Diabetes mellitus without complication ([place])   . Diastolic heart failure ([place])   . HLD (hyperlipidemia)   . Hypertension   . Morbid obesity with BMI of 45.0-49.9, adult ([place])   .  Multiple lung nodules on CT   . Shortness of breath dyspnea    Past Surgical History:  Procedure Laterality Date  . BIOPSY THYROID Right 02-06-14   NEGATIVE FOR MALIGNANT CELLS proteinaceous material and macrophages.  Marland Kitchen BREAST BIOPSY Left    neg  . COLONOSCOPY WITH PROPOFOL N/A 01/01/2016   Procedure: COLONOSCOPY WITH PROPOFOL;  Surgeon: Lucilla Lame, MD;  Location: [facility] ENDOSCOPY;  Service: Endoscopy;  Laterality: N/A;  . thryoid fna Right April 2015   Proteinaceous  material and macrophages.  . WISDOM TOOTH EXTRACTION     Family History  Problem Relation Age of Onset  . Cancer Mother 68       uterian and ovarian   . Goiter Mother   . Stroke Father   . Breast cancer Sister 50  . Stroke Brother   . Heart murmur Sister    Social History   Socioeconomic History  . Marital status: Single    Spouse name: Not on file  . Number of children: Not on file  . Years of education: Not on file  . Highest education level: Not on file  Occupational History  . Not on file  Social Needs  . Financial resource strain: Hard  . Food insecurity:    Worry: Sometimes true    Inability: Sometimes true  . Transportation needs:    Medical: No    Non-medical: No  Tobacco Use  . Smoking status: Former Smoker    Years: 33.00    Last attempt to quit: 07/10/2013    Years since quitting: 4.4  . Smokeless tobacco: Never Used  Substance and Sexual Activity  . Alcohol use: No    Alcohol/week: 0.0 oz  . Drug use: No  . Sexual activity: Not Currently  Lifestyle  . Physical activity:    Days per week: 7 days    Minutes per session: 20 min  . Stress: Very much  Relationships  . Social connections:    Talks on phone: More than three times a week    Gets together: Three times a week    Attends religious service: Never    Active member of club or organization: No    Attends meetings of clubs or organizations: Never    Relationship status: Divorced  Other Topics Concern  . Not on file  Social History Narrative  . Not on file    Tobacco Counseling Counseling given: Not Answered   Clinical Intake:           BMI - recorded: 39.7 Nutritional Status: BMI > 30  Obese Nutritional Risks: None Diabetes: Yes CBG done?: No Did pt. bring in CBG monitor from home?: No  How often do you need to have someone help you when you read instructions, pamphlets, or other written materials from your doctor or pharmacy?: 1 - Never  Interpreter Needed?: No       Activities of Daily Living In your present state of health, do you have any difficulty performing the following activities: 12/24/2017  Hearing? N  Vision? N  Difficulty concentrating or making decisions? Y  Comment rare  Walking or climbing stairs? Y  Comment related COPD and knees  Dressing or bathing? N  Doing errands, shopping? N  Preparing Food and eating ? N  Using the Toilet? N  In the past six months, have you accidently leaked urine? Y  Comment stress incontinence  Do you have problems with loss of bowel control? N  Managing your Medications? N  Managing  your Finances? N  Housekeeping or managing your Housekeeping? N  Some recent data might be hidden     Immunizations and Health Maintenance Immunization History  Administered Date(s) Administered  . Influenza,inj,Quad PF,6+ Mos 07/02/2015, 08/19/2016, 08/07/2017  . Pneumococcal Polysaccharide-23 09/26/2015   Health Maintenance Due  Topic Date Due  . OPHTHALMOLOGY EXAM  12/29/2015    Patient Care Team: Leone Haven, MD as PCP - General (Family Medicine) Bary Castilla, Forest Gleason, MD (General Surgery) Coral Spikes, DO as Consulting Physician (Family Medicine) Minna Merritts, MD as Consulting Physician (Cardiology)  Indicate any recent Medical Services you may have received from other than [facility] providers in the past year (date may be approximate). None.      Assessment:   This is a routine wellness examination for [place].  Hearing/Vision screen Hearing Screening Comments: Intact to finger rub Vision Screening Comments: Deferred due to having regular eye appointments with eye doctor  Dietary issues and exercise activities discussed: Current Exercise Habits: Home exercise routine, Type of exercise: walking, Time (Minutes): 20, Frequency (Times/Week): 7, Weekly Exercise (Minutes/Week): 140, Intensity: Moderate, Exercise limited by: respiratory conditions(s)  Goals    . Weight (lb) < 155 lb (70.3 kg)      Eat healthy - salad, eggs, healthy soup Exercise - walks 20 minutes daily, COPD limits this      Depression Screen PHQ 2/9 Scores 12/24/2017 08/07/2017 02/11/2017 07/02/2015 06/11/2015  PHQ - 2 Score 0 0 2 2 0  PHQ- 9 Score - - 10 8 -    Fall Risk Fall Risk  12/24/2017 07/02/2015 06/11/2015  Falls in the past year? No Yes No  Number falls in past yr: - 2 or more -  Injury with Fall? - Yes -    Is the patient's home free of loose throw rugs in walkways, pet beds, electrical cords, etc?   no      Grab bars in the bathroom? no      Handrails on the stairs?   no      Adequate lighting?   yes  Timed Get Up and Go Performed yes, 14 seconds, though patient performing with oxygen tank  Cognitive Function: MMSE - Mini Mental State Exam 12/24/2017  Orientation to time 5  Orientation to Place 5  Registration 3  Attention/ Calculation 5  Recall 2  Language- name 2 objects 2  Language- repeat 1  Language- follow 3 step command 3  Language- read & follow direction 1  Write a sentence 1  Copy design 1  Total score 29     6CIT Screen 12/24/2017  What Year? 0 points  What month? 0 points  What time? 0 points  Count back from 20 0 points  Months in reverse 0 points  Repeat phrase 0 points  Total Score 0    Screening Tests Health Maintenance  Topic Date Due  . OPHTHALMOLOGY EXAM  12/29/2015  . FOOT EXAM  02/11/2018  . HEMOGLOBIN A1C  05/17/2018  . PAP SMEAR  02/13/2019  . MAMMOGRAM  03/19/2019  . PNEUMOCOCCAL POLYSACCHARIDE VACCINE (2) 09/25/2020  . TETANUS/TDAP  06/10/2025  . COLONOSCOPY  12/31/2025  . INFLUENZA VACCINE  Completed  . Hepatitis C Screening  Completed  . HIV Screening  Completed  reports optho exam done, records requested  Qualifies for Shingles Vaccine? Yes, defers to pharmacy  Cancer Screenings: Lung: Low Dose CT Chest recommended if Age 15-80 years, 30 pack-year currently smoking OR have quit w/in 15years. Patient does not  qualify. Breast: Up to date on  Mammogram? Yes   Colorectal: UTD  Additional Screenings: Hepatitis B/HIV/Syphillis: UTD Hepatitis C Screening: UTD   Health Screenings  Mammogram UTD Pap Smear UTD Colonoscopy UTD Bone Density not indicated Glaucoma no, follows with optho Hearing  Intact to finger rub Hemoglobin A1C UTD Cholesterol UTD  Social  Alcohol intake none Smoking history- former Smokers in home?no Illicit drug use?no Exercise walks Diet monitors healthy diet Sexually Active no  Safety  Patient feesl safe at home. yes Patient does have smoke detectors at home yes Patient does wear sunscreen or protective clothing when in direct sunlight no Patient does wear seat belt when driving or riding with others. yes  Activities of Daily Living Patient can do their own household chores. Denies needing assistance with: driving, feeding themselves, getting from bed to chair, getting to the toilet, bathing/showering, dressing, managing money, climbing flight of stairs, or preparing meals.   Immunizations The following Immunizations are up to date: Influenza, pneumonia, and tetanus. Patient will check with pharmacy regarding shingles vaccine.  Other Providers Patient Care Team: Leone Haven, MD as PCP - General (Family Medicine) Bary Castilla, Forest Gleason, MD (General Surgery) Coral Spikes, DO as Consulting Physician (Family Medicine) Minna Merritts, MD as Consulting Physician (Cardiology)    Plan:    Wellness visit completed for this 52 yo female.   I will see if our clinical pharmacist can look in to assistance for the patients COPD inhalers.   I have personally reviewed and noted the following in the patient's chart:   . Medical and social history . Use of alcohol, tobacco or illicit drugs  . Current medications and supplements . Functional ability and status . Nutritional status . Physical activity . Advanced directives . List of other physicians . Hospitalizations, surgeries, and ER  visits in previous 12 months . Vitals . Screenings to include cognitive, depression, and falls . Referrals and appointments  In addition, I have reviewed and discussed with patient certain preventive protocols, quality metrics, and best practice recommendations. A written personalized care plan for preventive services as well as general preventive health recommendations were provided to patient.     [name], MD   12/24/2017

## 2017-12-28 ENCOUNTER — Telehealth: Payer: Self-pay | Admitting: Pharmacist

## 2017-12-28 NOTE — Telephone Encounter (Signed)
Patient returns my call. States below medication costs which are unaffordable per patient  Dulera $144.67/ 3 month supply Spiriva $135/month/ 3 month supply  Patient states Dr. Alva Garnet is her pulmonologist and requests Dr. Caryl Bis confer with him before making changes to inhaled medications.   Patient agrees to apply for LIS/Extra help. Applied with her on the phone. Explained she will be receiving a letter from [employer] with decision on approval. Patient to call me back when she receives letter.   Martha Woodard, Pharm.D., BCPS PGY2 Ambulatory Care Pharmacy Resident Phone: [phone]

## 2017-12-28 NOTE — Telephone Encounter (Signed)
Called patient to assess affordability for inhalers Ruthe Mannan and Spiriva). No answer. Left HIPAA-compliant VM.   Carlean Jews, Pharm.D., BCPS PGY2 Ambulatory Care Pharmacy Resident Phone: [phone]

## 2017-12-29 DIAGNOSIS — J449 Chronic obstructive pulmonary disease, unspecified: Secondary | ICD-10-CM | POA: Diagnosis not present

## 2017-12-31 ENCOUNTER — Ambulatory Visit: Payer: Medicare Other | Admitting: Family Medicine

## 2017-12-31 NOTE — Telephone Encounter (Signed)
I would suggest we see if she gets assistance to make her current medications affordable. She could also check with Dr Alva Garnet office to see if they have any samples of her medications. Thanks.

## 2018-01-11 ENCOUNTER — Other Ambulatory Visit: Payer: Self-pay | Admitting: Cardiovascular Disease

## 2018-01-25 ENCOUNTER — Telehealth: Payer: Self-pay | Admitting: Pharmacist

## 2018-01-25 ENCOUNTER — Ambulatory Visit: Payer: Medicare Other | Admitting: Pharmacist

## 2018-01-25 MED ORDER — TIOTROPIUM BROMIDE MONOHYDRATE 18 MCG IN CAPS: 18.0000 ug | ORAL_CAPSULE | Freq: Every day | RESPIRATORY_TRACT | 3 refills | Status: DC

## 2018-01-25 NOTE — Telephone Encounter (Signed)
Called patient to follow up on LIS/Extra help application. Patient states she was denied for this. Filled out applications for patient assistance via [employer] (proventil and dulera), and [employer] (spiriva). Patient to drop off financial documentation and sign applications later today.   Carlean Jews, Pharm.D., BCPS PGY2 Ambulatory Care Pharmacy Resident Phone: [phone]

## 2018-01-25 NOTE — Telephone Encounter (Signed)
Patient denied for LIS/Extra help. Patient assistance applications started for dulera, proventil, and spiriva.

## 2018-01-25 NOTE — Addendum Note (Signed)
Addended by: Deirdre Pippins E on: 01/25/2018 10:24 AM   Modules accepted: Orders

## 2018-01-25 NOTE — Telephone Encounter (Signed)
Application signed.

## 2018-01-28 DIAGNOSIS — J449 Chronic obstructive pulmonary disease, unspecified: Secondary | ICD-10-CM | POA: Diagnosis not present

## 2018-02-09 ENCOUNTER — Encounter: Payer: Self-pay | Admitting: Family Medicine

## 2018-02-09 ENCOUNTER — Ambulatory Visit (INDEPENDENT_AMBULATORY_CARE_PROVIDER_SITE_OTHER): Payer: Medicare Other | Admitting: Family Medicine

## 2018-02-09 ENCOUNTER — Other Ambulatory Visit: Payer: Self-pay

## 2018-02-09 VITALS — BP 120/68 | HR 83 | Temp 98.8°F | Ht 66.0 in | Wt 246.4 lb

## 2018-02-09 DIAGNOSIS — N644 Mastodynia: Secondary | ICD-10-CM | POA: Diagnosis not present

## 2018-02-09 DIAGNOSIS — E782 Mixed hyperlipidemia: Secondary | ICD-10-CM

## 2018-02-09 DIAGNOSIS — L729 Follicular cyst of the skin and subcutaneous tissue, unspecified: Secondary | ICD-10-CM | POA: Diagnosis not present

## 2018-02-09 DIAGNOSIS — E1159 Type 2 diabetes mellitus with other circulatory complications: Secondary | ICD-10-CM

## 2018-02-09 DIAGNOSIS — I10 Essential (primary) hypertension: Secondary | ICD-10-CM | POA: Diagnosis not present

## 2018-02-09 DIAGNOSIS — Z8041 Family history of malignant neoplasm of ovary: Secondary | ICD-10-CM

## 2018-02-09 DIAGNOSIS — J309 Allergic rhinitis, unspecified: Secondary | ICD-10-CM | POA: Diagnosis not present

## 2018-02-09 DIAGNOSIS — E041 Nontoxic single thyroid nodule: Secondary | ICD-10-CM

## 2018-02-09 NOTE — Patient Instructions (Signed)
Nice to see you. We will have you return in about a week for an A1c with a nurse. We will get you set up for mammogram of your bilateral breasts. Please continue to work on diet and exercise. If you would like to see a dermatologist please let us know.

## 2018-02-09 NOTE — Assessment & Plan Note (Signed)
Continue current regimen

## 2018-02-09 NOTE — Assessment & Plan Note (Signed)
Continue metformin.  Well-controlled.

## 2018-02-09 NOTE — Assessment & Plan Note (Signed)
Well-controlled.  Continue current regimen. 

## 2018-02-09 NOTE — [place] Notes (Signed)
Patient notified and would like referral

## 2018-02-09 NOTE — Assessment & Plan Note (Signed)
Encouraged diet and exercise.  

## 2018-02-09 NOTE — Assessment & Plan Note (Signed)
Will obtain mammogram and ultrasounds.  I did not get to discuss genetic counseling with her prior to her leaving.  We will have CMA contact the patient to see if she would like to do that.

## 2018-02-09 NOTE — Assessment & Plan Note (Signed)
Offered referral to dermatology though she deferred at this time and she will check with her insurance to see who is covered.  She also has scattered skin tags around her neck.

## 2018-02-09 NOTE — [place] Notes (Signed)
Martha Rumps, MD Phone: [phone]  Martha Woodard is a 52 y.o. female who presents today for f/u.  HYPERTENSION Disease Monitoring: Blood pressure range-not checking Chest pain- no      Dyspnea- stable with lung disease Medications: Compliance- taking HCTZ, metoprolol  DIABETES Disease Monitoring: Blood Sugar ranges-good Polyuria/phagia/dipsia- no     Optho- UTD Medications: Compliance- metformin Hypoglycemic symptoms- rare, eats and improved  HYPERLIPIDEMIA Patient has been trying to work on diet and exercise. Walking some. Decreased soda intake. Does not want medication at this time.   Allergic rhinitis: Patient notes sneezing, congestion, rhinorrhea, and fullness intermittently.  Blowing clear mucus out of her nose.  Using some Flonase which was helpful this morning.  She reports left breast discomfort that has been going on for some time.  Occasionally will occur.  Shoots to the left lateral portion of her breast.  Occurs around biopsy site.  Lasts up to a minute.  Does note family history of 2 sisters with breast cancer and her mother with ovarian and uterine cancer.    She reports a cyst on the top of her head.  This has come on since she had several cysts removed from her head by Dr. Lacinda Woodard.  She also has a number of skin tags and moles around her neck.  She wonders about having these removed.    Social History   Tobacco Use  Smoking Status Former Smoker  . Years: 33.00  . Last attempt to quit: 07/10/2013  . Years since quitting: 4.5  Smokeless Tobacco Never Used     ROS see history of present illness  Objective  Physical Exam Vitals:   02/09/18 0924  BP: 120/68  Pulse: 83  Temp: 98.8 F (37.1 C)  SpO2: 95%    BP Readings from Last 3 Encounters:  02/09/18 120/68  12/24/17 126/70  11/09/17 120/70   Wt Readings from Last 3 Encounters:  02/09/18 246 lb 6.4 oz (111.8 kg)  12/24/17 246 lb 3.2 oz (111.7 kg)  11/09/17 251 lb 3.2 oz (113.9 kg)     Physical Exam  Constitutional: No distress.  HENT:  Head: Normocephalic and atraumatic.    Mouth/Throat: Oropharynx is clear and moist.  Eyes: Pupils are equal, round, and reactive to light. Conjunctivae are normal.  Neck: Neck supple.  Cardiovascular: Normal rate, regular rhythm and normal heart sounds.  Pulmonary/Chest: Effort normal and breath sounds normal.  Genitourinary:  Genitourinary Comments: Left breast with tenderness at the 3 o'clock position with no palpable underlying abnormalities palpable, no palpable masses in the left breast, she does have candidal intertrigo underneath both breasts and axilla with likely cyst noted that is freely mobile,, right breast with no tenderness or masses, single bug bite noted on the right breast, 2 small likely lymph nodes in the inferior medial right axilla that are slightly tender, no other masses in the axilla, no overlying skin changes in that area  Musculoskeletal: She exhibits no edema.  Lymphadenopathy:    She has no cervical adenopathy.  Neurological: She is alert.  Skin: Skin is warm and dry. She is not diaphoretic.  Scattered skin tags around her neck   Assessment/Plan: Please see individual problem list.  Essential hypertension Well-controlled.  Continue current regimen.  Allergic rhinitis Continue current regimen.  DM type 2 (diabetes mellitus, type 2) (HCC) Continue metformin.  Well-controlled.  Hyperlipidemia Encouraged diet and exercise.  Breast pain Will obtain mammogram and ultrasounds.  I did not get to discuss genetic counseling with her  prior to her leaving.  We will have CMA contact the patient to see if she would like to do that.  Skin cyst Offered referral to dermatology though she deferred at this time and she will check with her insurance to see who is covered.  She also has scattered skin tags around her neck.  Thyroid nodule Previously evaluated by Dr. Bary Castilla.  Last evaluation in 2017.  He  recommended follow-up in 1 year though the patient has declined this.  I discussed follow-up ultrasound could be revealing.  She deferred this at this time.    Orders Placed This Encounter  Procedures  . MM DIAG BREAST TOMO BILATERAL    Standing Status:   Future    Standing Expiration Date:   04/12/2019    Order Specific Question:   Reason for Exam (SYMPTOM  OR DIAGNOSIS REQUIRED)    Answer:   left lateral breast pain and tenderness at 3 oclock, right axillary lymph nodes palpable and tender    Order Specific Question:   Is the patient pregnant?    Answer:   No    Order Specific Question:   Preferred imaging location?    Answer:   [facility]  . US BREAST LTD UNI RIGHT INC AXILLA    Standing Status:   Future    Standing Expiration Date:   04/12/2019    Order Specific Question:   Reason for Exam (SYMPTOM  OR DIAGNOSIS REQUIRED)    Answer:   left lateral breast pain and tenderness at 3 oclock, right axillary lymph nodes palpable and tender    Order Specific Question:   Preferred imaging location?    Answer:   [facility]  . US BREAST LTD UNI LEFT INC AXILLA    Standing Status:   Future    Standing Expiration Date:   04/12/2019    Order Specific Question:   Reason for Exam (SYMPTOM  OR DIAGNOSIS REQUIRED)    Answer:   left lateral breast pain and tenderness at 3 oclock, right axillary lymph nodes palpable and tender    Order Specific Question:   Preferred imaging location?    Answer:   [facility]  . POCT HgB A1C    Standing Status:   Future    Standing Expiration Date:   03/12/2018    No orders of the defined types were placed in this encounter.    Martha Rumps, MD [place]

## 2018-02-09 NOTE — [place] Notes (Signed)
Referral to genetics placed.

## 2018-02-09 NOTE — Assessment & Plan Note (Signed)
Previously evaluated by Dr. Bary Castilla.  Last evaluation in 2017.  He recommended follow-up in 1 year though the patient has declined this.  I discussed follow-up ultrasound could be revealing.  She deferred this at this time.

## 2018-02-09 NOTE — Addendum Note (Signed)
Addended by: Leone Haven on: 02/09/2018 05:53 PM   Modules accepted: Orders

## 2018-02-12 ENCOUNTER — Ambulatory Visit (INDEPENDENT_AMBULATORY_CARE_PROVIDER_SITE_OTHER): Payer: Medicare Other

## 2018-02-12 ENCOUNTER — Telehealth: Payer: Self-pay

## 2018-02-12 ENCOUNTER — Ambulatory Visit (INDEPENDENT_AMBULATORY_CARE_PROVIDER_SITE_OTHER): Payer: Medicare Other | Admitting: Family Medicine

## 2018-02-12 ENCOUNTER — Encounter: Payer: Self-pay | Admitting: Family Medicine

## 2018-02-12 VITALS — BP 132/80 | HR 71 | Temp 98.2°F | Resp 20 | Ht 66.0 in | Wt 246.2 lb

## 2018-02-12 DIAGNOSIS — J441 Chronic obstructive pulmonary disease with (acute) exacerbation: Secondary | ICD-10-CM

## 2018-02-12 DIAGNOSIS — R059 Cough, unspecified: Secondary | ICD-10-CM

## 2018-02-12 DIAGNOSIS — R05 Cough: Secondary | ICD-10-CM

## 2018-02-12 DIAGNOSIS — R0602 Shortness of breath: Secondary | ICD-10-CM | POA: Diagnosis not present

## 2018-02-12 MED ORDER — ALBUTEROL SULFATE (2.5 MG/3ML) 0.083% IN NEBU: 2.5000 mg | INHALATION_SOLUTION | Freq: Four times a day (QID) | RESPIRATORY_TRACT | 12 refills | Status: DC | PRN

## 2018-02-12 MED ORDER — PREDNISONE 20 MG PO TABS: 40.0000 mg | ORAL_TABLET | Freq: Every day | ORAL | 0 refills | Status: DC

## 2018-02-12 NOTE — Telephone Encounter (Signed)
Copied from [place] [id number]. Topic: Inquiry >> Feb 12, 2018  3:15 PM Neva Seat wrote: Pt needing Xray results.  Please call pt back asap.

## 2018-02-12 NOTE — [place] Notes (Signed)
Pre-visit discussion using our clinic review tool. No additional management support is needed unless otherwise documented below in the visit note.  

## 2018-02-12 NOTE — Patient Instructions (Addendum)
Nice to see you. You likely have bronchitis.  We will treat you with prednisone.  We will get a chest x-ray and contact you with the results. If you are able to afford the albuterol nebulizer solution please use this every 6 hours for the next 2 days and then as needed. If you develop chest pain, shortness of breath, cough productive of blood, fevers, or new or changing symptoms please seek medical attention immediately.

## 2018-02-12 NOTE — Progress Notes (Signed)
[name], MD Phone: [phone]  Martha Woodard is a 52 y.o. female who presents today for same day.  CC: Cough  Onset of symptoms yesterday.  Feels stuffy in her head and chest.  Not able to cough anything up.  Notes blowing some small clear mucus out of her nose.  Some slight increased shortness of breath.  No chest pain.  Some wheezing.  Her sister was sick last week and.  She has not taken any medication for this. She has COPD.  She is chronically on oxygen and has not had to increase her liters.  Social History   Tobacco Use  Smoking Status Former Smoker  . Years: 33.00  . Last attempt to quit: 07/10/2013  . Years since quitting: 4.6  Smokeless Tobacco Never Used     ROS see history of present illness  Objective  Physical Exam Vitals:   02/12/18 1004  BP: 132/80  Pulse: 71  Resp: 20  Temp: 98.2 F (36.8 C)  SpO2: 91%    BP Readings from Last 3 Encounters:  02/12/18 132/80  02/09/18 120/68  12/24/17 126/70   Wt Readings from Last 3 Encounters:  02/12/18 246 lb 4 oz (111.7 kg)  02/09/18 246 lb 6.4 oz (111.8 kg)  12/24/17 246 lb 3.2 oz (111.7 kg)    Physical Exam  Constitutional: No distress.  HENT:  Head: Normocephalic and atraumatic.  Mouth/Throat: Oropharynx is clear and moist. No oropharyngeal exudate.  Eyes: Pupils are equal, round, and reactive to light. Conjunctivae are normal.  Neck: Neck supple.  Cardiovascular: Normal rate, regular rhythm and normal heart sounds.  Pulmonary/Chest: Effort normal. No respiratory distress. She has wheezes (Minimal scattered expiratory). She has no rales.  Musculoskeletal: She exhibits no edema.  Lymphadenopathy:    She has no cervical adenopathy.  Neurological: She is alert.  Skin: Skin is warm and dry. She is not diaphoretic.     Assessment/Plan: Please see individual problem list.  COPD exacerbation ([place]) Patient with symptoms most consistent with COPD exacerbation.  Likely viral illness  contributing.  Will obtain a chest x-ray to evaluate for underlying pneumonia.  Treat with prednisone and albuterol nebulizer on a schedule every 6 hours for the next 2 days and then every 6 hours as needed.  Given return precautions.   Orders Placed This Encounter  Procedures  . DG Chest 2 View    Standing Status:   Future    Number of Occurrences:   1    Standing Expiration Date:   04/15/2019    Order Specific Question:   Reason for Exam (SYMPTOM  OR DIAGNOSIS REQUIRED)    Answer:   cough, wheezing upper lungs    Order Specific Question:   Is patient pregnant?    Answer:   No    Order Specific Question:   Preferred imaging location?    Answer:   [facility]    Order Specific Question:   Radiology Contrast Protocol - do NOT remove file path    Answer:   [email or web address].pdf    Meds ordered this encounter  Medications  . predniSONE (DELTASONE) 20 MG tablet    Sig: Take 2 tablets (40 mg total) by mouth daily with breakfast.    Dispense:  10 tablet    Refill:  0  . albuterol (PROVENTIL) (2.5 MG/3ML) 0.083% nebulizer solution    Sig: Take 3 mLs (2.5 mg total) by nebulization every 6 (six) hours as needed for wheezing or shortness of breath.  Dispense:  75 mL    Refill:  [place], MD [place]

## 2018-02-14 NOTE — Assessment & Plan Note (Signed)
Patient with symptoms most consistent with COPD exacerbation.  Likely viral illness contributing.  Will obtain a chest x-ray to evaluate for underlying pneumonia.  Treat with prednisone and albuterol nebulizer on a schedule every 6 hours for the next 2 days and then every 6 hours as needed.  Given return precautions.

## 2018-02-16 ENCOUNTER — Ambulatory Visit (INDEPENDENT_AMBULATORY_CARE_PROVIDER_SITE_OTHER): Payer: Medicare Other | Admitting: Family Medicine

## 2018-02-16 ENCOUNTER — Encounter: Payer: Self-pay | Admitting: Family Medicine

## 2018-02-16 ENCOUNTER — Telehealth: Payer: Self-pay | Admitting: Family Medicine

## 2018-02-16 VITALS — BP 130/80 | HR 75 | Temp 98.6°F | Wt 244.8 lb

## 2018-02-16 DIAGNOSIS — J441 Chronic obstructive pulmonary disease with (acute) exacerbation: Secondary | ICD-10-CM

## 2018-02-16 DIAGNOSIS — R05 Cough: Secondary | ICD-10-CM | POA: Diagnosis not present

## 2018-02-16 DIAGNOSIS — R059 Cough, unspecified: Secondary | ICD-10-CM

## 2018-02-16 MED ORDER — PREDNISONE 20 MG PO TABS: ORAL_TABLET | ORAL | 0 refills | Status: DC

## 2018-02-16 MED ORDER — ALBUTEROL SULFATE (2.5 MG/3ML) 0.083% IN NEBU
2.5000 mg | INHALATION_SOLUTION | Freq: Once | RESPIRATORY_TRACT | Status: AC
Start: 2018-02-16 — End: 2018-02-16
  Administered 2018-02-16: 2.5 mg via RESPIRATORY_TRACT

## 2018-02-16 MED ORDER — DOXYCYCLINE HYCLATE 100 MG PO TABS: 100.0000 mg | ORAL_TABLET | Freq: Two times a day (BID) | ORAL | 0 refills | Status: DC

## 2018-02-16 NOTE — Telephone Encounter (Signed)
Copied from [place] [id number]. Topic: Quick Communication - See Telephone Encounter >> Feb 16, 2018  9:03 AM Vernona Rieger wrote: CRM for notification. See Telephone encounter for: 02/16/18.  Patient states she was in the office on Friday to see Dr Caryl Bis. She said he diagnosed her with Bronchitis. She said she is getting worse and feels like she needs an antibiotic now. She has a very deep cough with congestion. She is currently only taking prednisone and albuterol.  [place], [place].

## 2018-02-16 NOTE — Telephone Encounter (Signed)
Please advise 

## 2018-02-16 NOTE — Patient Instructions (Signed)
Nice to see you. We will extend your prednisone treatment. Will place on doxycycline. Please use 2 L of oxygen when ambulating and 1 L of oxygen while at rest. If you develop cough productive of blood, worsening breathing issues, chest pain, or any new or changing symptoms please seek medical attention immediately.

## 2018-02-16 NOTE — Progress Notes (Signed)
[name], MD Phone: [phone]  Martha Woodard is a 52 y.o. female who presents today for f/u.  CC: bronchitis  Seen last week and diagnosed with bronchitis and likely COPD exacerbation.  Treated with prednisone.  She notes her cough is worsened.  She is coughing up yellow mucus.  She notes no shortness of breath at rest though some with exertion and coughing.  She felt feverish over the weekend though that has improved.  She has been using albuterol 3 times daily.  She notes her muscles feel sore from coughing.  She wears oxygen 1 L during the day and 2 L at night.  In the past she has been hospitalized with similar symptoms.  Social History   Tobacco Use  Smoking Status Former Smoker  . Years: 33.00  . Last attempt to quit: 07/10/2013  . Years since quitting: 4.6  Smokeless Tobacco Never Used     ROS see history of present illness  Objective  Physical Exam Vitals:   02/16/18 1439 02/16/18 1622  BP: 130/80   Pulse: 75   Temp: 98.6 F (37 C)   SpO2: (!) 87% 94%   Oxygenation improved to 91% at rest on 1 L nasal cannula after albuterol nebulizer treatment, ambulatory O2 sat down to 84% on 1 L nasal cannula  BP Readings from Last 3 Encounters:  02/16/18 130/80  02/12/18 132/80  02/09/18 120/68   Wt Readings from Last 3 Encounters:  02/16/18 244 lb 12.8 oz (111 kg)  02/12/18 246 lb 4 oz (111.7 kg)  02/09/18 246 lb 6.4 oz (111.8 kg)    Physical Exam  Constitutional: No distress.  Tired appearing  HENT:  Head: Normocephalic and atraumatic.  Mouth/Throat: Oropharynx is clear and moist.  Eyes: Pupils are equal, round, and reactive to light. Conjunctivae are normal.  Cardiovascular: Normal rate, regular rhythm and normal heart sounds.  Pulmonary/Chest: Effort normal. No respiratory distress. She has no rales.  Minimal faint scattered wheezes improved from prior  Musculoskeletal: She exhibits no edema.  Neurological: She is alert.  Skin: Skin is warm and  dry. She is not diaphoretic.     Assessment/Plan: Please see individual problem list.  COPD exacerbation ([place]) Symptoms have worsened.  Patient with oxygen desaturation on her normal 1 L nasal cannula on ambulation despite albuterol nebulizer treatment.  I discussed and recommended emergency room evaluation though she declined this.  She wanted home management.  We will treat with doxycycline.  We will extend her prednisone and taper as prescribed.  She will use 1 L nasal cannula of oxygen while at rest and increased to 2 L nasal cannula of oxygen while exerting herself.  She will continue her albuterol nebulizer treatments every 6 hours.  She will follow-up in 2 days for recheck.  If she is not improving prior to then or if she worsens prior to then or develops any new symptoms she will go to the emergency department.  She is given return precautions.   No orders of the defined types were placed in this encounter.   Meds ordered this encounter  Medications  . albuterol (PROVENTIL) (2.5 MG/3ML) 0.083% nebulizer solution 2.5 mg  . predniSONE (DELTASONE) 20 MG tablet    Sig: 40 mg (2 tablets) by mouth daily for 3 days, then take 20 mg (1 tablet) by mouth daily for 3 days    Dispense:  9 tablet    Refill:  0  . doxycycline (VIBRA-TABS) 100 MG tablet    Sig:  Take 1 tablet (100 mg total) by mouth 2 (two) times daily.    Dispense:  14 tablet    Refill:  0     [name], MD [place]

## 2018-02-16 NOTE — Telephone Encounter (Signed)
Patient scheduled.

## 2018-02-16 NOTE — Telephone Encounter (Signed)
scheduled

## 2018-02-16 NOTE — Telephone Encounter (Signed)
Please see if she can come in at 2:45 to see me for re-evaluation. Thanks.

## 2018-02-16 NOTE — Assessment & Plan Note (Signed)
Symptoms have worsened.  Patient with oxygen desaturation on her normal 1 L nasal cannula on ambulation despite albuterol nebulizer treatment.  I discussed and recommended emergency room evaluation though she declined this.  She wanted home management.  We will treat with doxycycline.  We will extend her prednisone and taper as prescribed.  She will use 1 L nasal cannula of oxygen while at rest and increased to 2 L nasal cannula of oxygen while exerting herself.  She will continue her albuterol nebulizer treatments every 6 hours.  She will follow-up in 2 days for recheck.  If she is not improving prior to then or if she worsens prior to then or develops any new symptoms she will go to the emergency department.  She is given return precautions.

## 2018-02-17 ENCOUNTER — Telehealth: Payer: Self-pay | Admitting: Pharmacist

## 2018-02-17 ENCOUNTER — Ambulatory Visit: Payer: Medicare Other

## 2018-02-17 NOTE — Telephone Encounter (Signed)
Patient approved for Spiriva effective 02/17/2018 through 09/28/2018 via [employer] assistance [employer].  MERCK has not received her attestation letter yet.   Called her to inform her, no answer. Left HIPAA-compliant VM.   Carlean Jews, Pharm.D., BCPS PGY2 Ambulatory Care Pharmacy Resident Phone: [phone]

## 2018-02-18 ENCOUNTER — Encounter: Payer: Self-pay | Admitting: Family Medicine

## 2018-02-18 ENCOUNTER — Ambulatory Visit (INDEPENDENT_AMBULATORY_CARE_PROVIDER_SITE_OTHER): Payer: Medicare Other | Admitting: Family Medicine

## 2018-02-18 DIAGNOSIS — E1159 Type 2 diabetes mellitus with other circulatory complications: Secondary | ICD-10-CM

## 2018-02-18 DIAGNOSIS — J441 Chronic obstructive pulmonary disease with (acute) exacerbation: Secondary | ICD-10-CM

## 2018-02-18 LAB — POCT GLYCOSYLATED HEMOGLOBIN (HGB A1C): HEMOGLOBIN A1C: 5.8 % — AB (ref 4.0–5.6)

## 2018-02-18 NOTE — Progress Notes (Signed)
  [name], MD Phone: [phone]  Martha Woodard is a 52 y.o. female who presents today for follow-up  CC: Bronchitis.  Patient was seen 2 days ago and diagnosed with bronchitis.  She was seen in the prior week as well and treated with prednisone.  She was placed on doxycycline and an extended taper of prednisone 2 days ago.  She notes her shortness of breath is some better.  She is still coughing up a little bit of yellow mucus though her cough is improved.  Congestion has been improving.  She is blowing yellow mucus out of her nose.  She has been trying to use 2 L of oxygen with ambulation.  She has been using 1 L at rest and sometimes on ambulation.  She overall feels like she is improving.  Notes her sugars have been running well with a CBG of 102 this morning.  Due for A1c today.  Social History   Tobacco Use  Smoking Status Former Smoker  . Years: 33.00  . Last attempt to quit: 07/10/2013  . Years since quitting: 4.6  Smokeless Tobacco Never Used     ROS see history of present illness  Objective  Physical Exam Vitals:   02/18/18 1152  BP: 134/78  Pulse: 79  Temp: 99 F (37.2 C)  SpO2: 93%    BP Readings from Last 3 Encounters:  02/18/18 134/78  02/16/18 130/80  02/12/18 132/80   Wt Readings from Last 3 Encounters:  02/18/18 244 lb (110.7 kg)  02/16/18 244 lb 12.8 oz (111 kg)  02/12/18 246 lb 4 oz (111.7 kg)    Physical Exam  Constitutional: No distress.  Cardiovascular: Normal rate, regular rhythm and normal heart sounds.  Pulmonary/Chest: Effort normal.  End expiratory wheezing and slight coarseness throughout, this is improved from her prior exam, no focal crackles  Musculoskeletal: She exhibits no edema.  Neurological: She is alert.  Skin: Skin is warm and dry. She is not diaphoretic.   Oxygen did drop to 86% on 1 L nasal cannula with ambulation, improved quickly with rest to 93%  Assessment/Plan: Please see individual problem list.  COPD  exacerbation ([place]) Seems to be doing better from her exam 2 days ago.  Her oxygen did still desaturate on 1 L nasal cannula with ambulation though is improved at rest.  I again discussed emergency room evaluation with her though given her overall improvement and that she has oxygen at home that she can increase appropriately to 2 L on ambulation she and I opted for home management.  Patient preferred not to go to the emergency department for evaluation.  She will complete her course of doxycycline.  She will finish her course of prednisone.  She will continue albuterol nebulizers.  If she does not continue to improve she will go to the emergency room.  If she worsens she will go to the emergency room.  Recheck as scheduled.  Given return precautions.  DM type 2 (diabetes mellitus, type 2) (HCC) A1c slightly improved.  She will continue to monitor CBGs.   No orders of the defined types were placed in this encounter.   No orders of the defined types were placed in this encounter.  Message sent to referral coordinator to schedule diagnostic mammogram and ultrasounds.  [name], MD [place]

## 2018-02-18 NOTE — Assessment & Plan Note (Signed)
Seems to be doing better from her exam 2 days ago.  Her oxygen did still desaturate on 1 L nasal cannula with ambulation though is improved at rest.  I again discussed emergency room evaluation with her though given her overall improvement and that she has oxygen at home that she can increase appropriately to 2 L on ambulation she and I opted for home management.  Patient preferred not to go to the emergency department for evaluation.  She will complete her course of doxycycline.  She will finish her course of prednisone.  She will continue albuterol nebulizers.  If she does not continue to improve she will go to the emergency room.  If she worsens she will go to the emergency room.  Recheck as scheduled.  Given return precautions.

## 2018-02-18 NOTE — Assessment & Plan Note (Signed)
A1c slightly improved.  She will continue to monitor CBGs.

## 2018-02-18 NOTE — Patient Instructions (Signed)
Nice to see you. Please continue with the doxycycline and the prednisone. Please try to use 2 L of oxygen when you are up moving around.  Please use 1 L at rest. If you develop shortness of breath, chest pain, cough productive of blood, or your symptoms do not continue to improve please be evaluated immediately.

## 2018-02-23 ENCOUNTER — Other Ambulatory Visit: Payer: Self-pay

## 2018-02-23 ENCOUNTER — Encounter: Payer: Self-pay | Admitting: Family Medicine

## 2018-02-23 ENCOUNTER — Ambulatory Visit (INDEPENDENT_AMBULATORY_CARE_PROVIDER_SITE_OTHER): Payer: Medicare Other | Admitting: Family Medicine

## 2018-02-23 DIAGNOSIS — T148XXA Other injury of unspecified body region, initial encounter: Secondary | ICD-10-CM

## 2018-02-23 DIAGNOSIS — J441 Chronic obstructive pulmonary disease with (acute) exacerbation: Secondary | ICD-10-CM

## 2018-02-23 MED ORDER — DOXYCYCLINE HYCLATE 100 MG PO TABS: 100.0000 mg | ORAL_TABLET | Freq: Two times a day (BID) | ORAL | 0 refills | Status: DC

## 2018-02-23 NOTE — Progress Notes (Signed)
  [name], MD Phone: [phone]  Martha Woodard is a 52 y.o. female who presents today for f/u.  CC: COPD exacerbation  Patient seen last week on 2 occasions for COPD exacerbation.  Treated with prednisone and doxycycline.  She notes she feels much better today.  Still some cough and blowing mucus out of her nose though it is much improved.  Mucus is less yellow than it had been.  Did feel like she had a fever on Friday though did not check her temperature.  She completed her doxycycline and prednisone today.  She has been using her nebulizers.  She has been increasing her oxygen to 2 L while exerting herself.  She reports for quite some time now she will intermittently have a bruise on her foot or toes.  She does not remember injuring herself.  It will last for 2 to 3 weeks and go away.  It has been going on much longer than the past 6 months.  Previously platelets were normal.  No bruising elsewhere.  Social History   Tobacco Use  Smoking Status Former Smoker  . Years: 33.00  . Last attempt to quit: 07/10/2013  . Years since quitting: 4.6  Smokeless Tobacco Never Used     ROS see history of present illness  Objective  Physical Exam Vitals:   02/23/18 1518  BP: 118/74  Pulse: 98  Temp: 98.2 F (36.8 C)  SpO2: 93%   Ambulatory O2 sat 89%  BP Readings from Last 3 Encounters:  02/23/18 118/74  02/18/18 134/78  02/16/18 130/80   Wt Readings from Last 3 Encounters:  02/23/18 242 lb 9.6 oz (110 kg)  02/18/18 244 lb (110.7 kg)  02/16/18 244 lb 12.8 oz (111 kg)    Physical Exam  Constitutional: No distress.  Cardiovascular: Normal rate, regular rhythm and normal heart sounds.  Pulmonary/Chest: Effort normal.  Minimal faint end expiratory wheezes that are scattered, no coarseness  Musculoskeletal: She exhibits no edema.  Neurological: She is alert.  Skin: Skin is warm and dry. She is not diaphoretic.  Bruise on inner portion of right foot,  nontender   Assessment/Plan: Please see individual problem list.  COPD exacerbation ([place]) Much improved.  Her oxygenation on ambulation has improved to an acceptable range.  She will continue her home oxygen.  We will extend her doxycycline for 3 more days as she continues to have some symptoms.  If she is not continuing to improve she will contact us.  Given return precautions.  Bruise Just on feet.  Prior platelets normal.  Given that this is just on her extremities it is reassuring.  I suspect she is having some sort of trauma to her feet and not realizing it.  She will monitor.  If she starts to bruise elsewhere she will let us know.   No orders of the defined types were placed in this encounter.   Meds ordered this encounter  Medications  . doxycycline (VIBRA-TABS) 100 MG tablet    Sig: Take 1 tablet (100 mg total) by mouth 2 (two) times daily.    Dispense:  6 tablet    Refill:  0     [name], MD [place]

## 2018-02-23 NOTE — Assessment & Plan Note (Signed)
Just on feet.  Prior platelets normal.  Given that this is just on her extremities it is reassuring.  I suspect she is having some sort of trauma to her feet and not realizing it.  She will monitor.  If she starts to bruise elsewhere she will let us know.

## 2018-02-23 NOTE — Assessment & Plan Note (Signed)
Much improved.  Her oxygenation on ambulation has improved to an acceptable range.  She will continue her home oxygen.  We will extend her doxycycline for 3 more days as she continues to have some symptoms.  If she is not continuing to improve she will contact us.  Given return precautions.

## 2018-02-23 NOTE — Patient Instructions (Signed)
Nice to see you. We will have you complete 3 more days of doxycycline.  If your symptoms do not continue to improve please contact us.  If you worsen please contact us as well. If you develop shortness of breath, cough productive of blood, or fevers please be evaluated immediately.

## 2018-02-28 DIAGNOSIS — J449 Chronic obstructive pulmonary disease, unspecified: Secondary | ICD-10-CM | POA: Diagnosis not present

## 2018-03-01 ENCOUNTER — Encounter: Payer: Self-pay | Admitting: Family Medicine

## 2018-03-01 ENCOUNTER — Ambulatory Visit (INDEPENDENT_AMBULATORY_CARE_PROVIDER_SITE_OTHER): Payer: Medicare Other | Admitting: Family Medicine

## 2018-03-01 ENCOUNTER — Telehealth: Payer: Self-pay

## 2018-03-01 DIAGNOSIS — J0100 Acute maxillary sinusitis, unspecified: Secondary | ICD-10-CM | POA: Diagnosis not present

## 2018-03-01 DIAGNOSIS — J329 Chronic sinusitis, unspecified: Secondary | ICD-10-CM | POA: Insufficient documentation

## 2018-03-01 DIAGNOSIS — R0981 Nasal congestion: Secondary | ICD-10-CM | POA: Insufficient documentation

## 2018-03-01 MED ORDER — AMOXICILLIN 500 MG PO CAPS: 500.0000 mg | ORAL_CAPSULE | Freq: Three times a day (TID) | ORAL | 0 refills | Status: DC

## 2018-03-01 NOTE — Telephone Encounter (Signed)
Copied from [place] [id number]. Topic: Appointment Scheduling - Scheduling Inquiry for Clinic >> Mar 01, 2018 10:53 AM Lennox Solders wrote: Reason for CRM: pt has seen dr Caryl Bis on 5-17 for cough etc. Pt has seen dr Biagio Quint twice since 02-12-18. Pt decline to see another provider. Pt would like to be work in today

## 2018-03-01 NOTE — Progress Notes (Signed)
  Martha Rumps, MD Phone: [phone]  Martha Woodard is a 52 y.o. female who presents today for f/u.  CC: Cough  Patient notes she did well with regards to her chest congestion and cough until a couple of days ago.  She improved while on the doxycycline.  Since coming off the doxycycline she has subsequently developed fairly significant sinus congestion.  She has felt some fevers and chills.  Notes no significant chest congestion though does note some burning sensation in her upper bronchioles.  She has not been coughing any blood up.  She is blowing yellow mucus out of her nose.  Had some wheezing this morning.  This feels more like a sinus infection to her than her prior bronchitis did.  Her breathing has remained improved from last week when I saw her.  Social History   Tobacco Use  Smoking Status Former Smoker  . Years: 33.00  . Last attempt to quit: 07/10/2013  . Years since quitting: 4.6  Smokeless Tobacco Never Used     ROS see history of present illness  Objective  Physical Exam Vitals:   03/01/18 1623  BP: 130/76  Pulse: 80  Temp: 99.2 F (37.3 C)  SpO2: 93%    BP Readings from Last 3 Encounters:  03/01/18 130/76  02/23/18 118/74  02/18/18 134/78   Wt Readings from Last 3 Encounters:  03/01/18 248 lb 12.8 oz (112.9 kg)  02/23/18 242 lb 9.6 oz (110 kg)  02/18/18 244 lb (110.7 kg)    Physical Exam  Constitutional: No distress.  HENT:  Postnasal drip noted, normal TMs  Eyes: Pupils are equal, round, and reactive to light. Conjunctivae are normal.  Neck: Neck supple.  Cardiovascular: Normal rate, regular rhythm and normal heart sounds.  Pulmonary/Chest: Effort normal and breath sounds normal.  Musculoskeletal: She exhibits no edema.  Lymphadenopathy:    She has no cervical adenopathy.  Neurological: She is alert.  Skin: Skin is warm and dry. She is not diaphoretic.     Assessment/Plan: Please see individual problem list.  Sinusitis Symptoms  now more consistent with sinusitis.  Her lungs are clear.  She has tolerated amoxicillin previously and has been given this to treat pneumonia in the past per her report.  We will trial amoxicillin 3 times daily as prescribed for sinusitis.  If not improving after the next 2 to 3 days we would transition her to a fluoroquinolone.  She is given return precautions.   No orders of the defined types were placed in this encounter.   Meds ordered this encounter  Medications  . amoxicillin (AMOXIL) 500 MG capsule    Sig: Take 1 capsule (500 mg total) by mouth 3 (three) times daily.    Dispense:  21 capsule    Refill:  0     Martha Rumps, MD [place]

## 2018-03-01 NOTE — Telephone Encounter (Signed)
Ok to place her in at 4:30. Thanks.

## 2018-03-01 NOTE — Patient Instructions (Signed)
Nice to see you. You likely now have a sinus infection.  We will treat you with amoxicillin.  If your symptoms are not starting to improve over the next 2 to 3 days please let us know and we will send in an alternative medication for you. If you develop shortness of breath, cough productive of blood, or any new or changing symptoms please seek medical attention immediately.

## 2018-03-01 NOTE — Telephone Encounter (Signed)
Please advise 

## 2018-03-01 NOTE — Assessment & Plan Note (Signed)
Symptoms now more consistent with sinusitis.  Her lungs are clear.  She has tolerated amoxicillin previously and has been given this to treat pneumonia in the past per her report.  We will trial amoxicillin 3 times daily as prescribed for sinusitis.  If not improving after the next 2 to 3 days we would transition her to a fluoroquinolone.  She is given return precautions.

## 2018-03-01 NOTE — Telephone Encounter (Signed)
Patient has been scheduled to see  Dr. Caryl Bis per Dr. Caryl Bis at 4:30.

## 2018-03-03 ENCOUNTER — Ambulatory Visit
Admission: RE | Admit: 2018-03-03 | Discharge: 2018-03-03 | Disposition: A | Discharge status: Home / Self Care | Payer: Medicare Other | Source: Ambulatory Visit | Attending: Family Medicine | Admitting: Family Medicine

## 2018-03-03 DIAGNOSIS — N644 Mastodynia: Secondary | ICD-10-CM

## 2018-03-03 DIAGNOSIS — R928 Other abnormal and inconclusive findings on diagnostic imaging of breast: Secondary | ICD-10-CM | POA: Diagnosis not present

## 2018-03-03 DIAGNOSIS — N6489 Other specified disorders of breast: Secondary | ICD-10-CM | POA: Diagnosis not present

## 2018-03-11 ENCOUNTER — Other Ambulatory Visit: Payer: Self-pay | Admitting: Family Medicine

## 2018-03-23 ENCOUNTER — Telehealth: Payer: Self-pay | Admitting: Pharmacist

## 2018-03-23 NOTE — Telephone Encounter (Signed)
Patient approved for proventil and dulera via [employer] patient assistance program effective 02/23/18 - 09/28/18. Called pt to f/u again however no answer. Left HIPAA-compliant VM requesting she return my call if needed.   Carlean Jews, Pharm.D., BCPS PGY2 Ambulatory Care Pharmacy Resident Phone: [phone]

## 2018-03-24 NOTE — Telephone Encounter (Signed)
Pt returned call and confirms she has rec'd all inhalers. Provided instructions on how to refill and discussed enrollment end date/next steps plan. Patient verbalized understanding.

## 2018-03-25 ENCOUNTER — Telehealth: Payer: Self-pay | Admitting: Cardiovascular Disease

## 2018-03-25 ENCOUNTER — Other Ambulatory Visit: Payer: Self-pay | Admitting: *Deleted

## 2018-03-25 MED ORDER — POTASSIUM CHLORIDE ER 10 MEQ PO TBCR: 10.0000 meq | EXTENDED_RELEASE_TABLET | Freq: Every day | ORAL | 0 refills | Status: DC

## 2018-03-25 NOTE — Telephone Encounter (Signed)
Requested Prescriptions   Signed Prescriptions Disp Refills  . potassium chloride (K-DUR) 10 MEQ tablet 90 tablet 0    Sig: Take 1 tablet (10 mEq total) by mouth daily.    Authorizing Provider: Minna Merritts    Ordering User: Britt Bottom

## 2018-03-25 NOTE — Telephone Encounter (Signed)
°*  STAT* If patient is at the pharmacy, call can be transferred to refill team.   1. Which medications need to be refilled? (please list name of each medication and dose if known) potassium chloride 10 MEQ 1 tablet once daily   2. Which pharmacy/location (including street and city if local pharmacy) is medication to be sent to? [facility] in [place]   3. Do they need a 30 day or 90 day supply? 90 day

## 2018-03-30 DIAGNOSIS — J449 Chronic obstructive pulmonary disease, unspecified: Secondary | ICD-10-CM | POA: Diagnosis not present

## 2018-04-12 ENCOUNTER — Other Ambulatory Visit: Payer: Self-pay | Admitting: Cardiovascular Disease

## 2018-04-22 ENCOUNTER — Encounter: Payer: Medicare Other | Admitting: Genetics

## 2018-04-22 ENCOUNTER — Other Ambulatory Visit: Payer: Medicare Other

## 2018-04-30 ENCOUNTER — Other Ambulatory Visit: Payer: Self-pay | Admitting: Family Medicine

## 2018-04-30 DIAGNOSIS — J449 Chronic obstructive pulmonary disease, unspecified: Secondary | ICD-10-CM | POA: Diagnosis not present

## 2018-05-31 DIAGNOSIS — J449 Chronic obstructive pulmonary disease, unspecified: Secondary | ICD-10-CM | POA: Diagnosis not present

## 2018-06-16 ENCOUNTER — Encounter: Payer: Self-pay | Admitting: Family Medicine

## 2018-06-16 ENCOUNTER — Ambulatory Visit (INDEPENDENT_AMBULATORY_CARE_PROVIDER_SITE_OTHER): Payer: Medicare Other | Admitting: Family Medicine

## 2018-06-16 VITALS — BP 134/80 | HR 73 | Temp 98.4°F | Ht 66.0 in | Wt 245.8 lb

## 2018-06-16 DIAGNOSIS — J432 Centrilobular emphysema: Secondary | ICD-10-CM | POA: Diagnosis not present

## 2018-06-16 DIAGNOSIS — E782 Mixed hyperlipidemia: Secondary | ICD-10-CM

## 2018-06-16 DIAGNOSIS — Z23 Encounter for immunization: Secondary | ICD-10-CM

## 2018-06-16 DIAGNOSIS — M79669 Pain in unspecified lower leg: Secondary | ICD-10-CM

## 2018-06-16 DIAGNOSIS — G8929 Other chronic pain: Secondary | ICD-10-CM | POA: Insufficient documentation

## 2018-06-16 DIAGNOSIS — I10 Essential (primary) hypertension: Secondary | ICD-10-CM | POA: Diagnosis not present

## 2018-06-16 DIAGNOSIS — R079 Chest pain, unspecified: Secondary | ICD-10-CM | POA: Diagnosis not present

## 2018-06-16 DIAGNOSIS — R42 Dizziness and giddiness: Secondary | ICD-10-CM

## 2018-06-16 DIAGNOSIS — J309 Allergic rhinitis, unspecified: Secondary | ICD-10-CM

## 2018-06-16 DIAGNOSIS — M79671 Pain in right foot: Secondary | ICD-10-CM

## 2018-06-16 LAB — BASIC METABOLIC PANEL
BUN: 12 mg/dL (ref 6–23)
CHLORIDE: 99 meq/L (ref 96–112)
CO2: 33 meq/L — AB (ref 19–32)
Calcium: 9.6 mg/dL (ref 8.4–10.5)
Creatinine, Ser: 0.61 mg/dL (ref 0.40–1.20)
GFR: 108.73 mL/min (ref >60.00)
Glucose, Bld: 95 mg/dL (ref 70–99)
Potassium: 3.6 mEq/L (ref 3.5–5.1)
SODIUM: 139 meq/L (ref 135–145)

## 2018-06-16 LAB — LDL CHOLESTEROL, DIRECT: LDL DIRECT: 140 mg/dL

## 2018-06-16 NOTE — Assessment & Plan Note (Addendum)
Suspect shin splints given the distribution of her discomfort.  Discussed slowing down the rate of walking until this improves.  If not improving she will let us know.

## 2018-06-16 NOTE — Assessment & Plan Note (Addendum)
Atypical in nature.  Suspect muscular cause with likely costochondritis.  EKG is reassuring.  Unlikely cardiac though she is due to follow-up with her cardiologist as well and she will contact them to set this appointment up.  Given return precautions.

## 2018-06-16 NOTE — Assessment & Plan Note (Signed)
Adequately controlled.  Check BMP.  Continue current regimen.

## 2018-06-16 NOTE — Patient Instructions (Signed)
Nice to see you. Please try to slow down on your walking and see if that helps with your legs. Please contact your cardiologist to set up follow-up. If you have recurrent chest pain please be evaluated immediately. Please try Flonase daily and see if that helps with your headaches.  If your headaches do not improve please let us know.

## 2018-06-16 NOTE — Progress Notes (Signed)
[name], MD Phone: [phone]  Martha Woodard is a 52 y.o. female who presents today for f/u.  CC: HTN, COPD, allergic rhinitis, headache, anterior shin pain, right foot pain, chest pain  HYPERTENSION  Disease Monitoring  Home BP Monitoring not checking Chest pain- yes, see below    Dyspnea- no Medications  Compliance-  Taking lasix, losartan, metoprolol. Lightheadness- yes  Edema- minimal bilateral  COPD: Patient is using her inhaler.  She notes minimal cough associated with allergic rhinitis symptoms.  Breathing is stable.  Allergic rhinitis: Patient notes some sneezing, itchy watery eyes, some postnasal drip at night.  Some mild cough.  No fevers.  She intermittently uses Flonase.  Patient does note some lightheadedness recently particularly occurring when she goes to stand up.  No syncope.  She has had some mild frontal headache.  She notes some positional numbness in her hands when her elbows are bent that resolves with change in position and notes no other numbness.  No weakness.  No vision changes.  No significant history of headaches.  She reports anterior shin discomfort that bothers her mostly at night.  Runs right along tibia.  No posterior leg discomfort.  She has increased how much she is walking and this started after that increase.  She does report some discomfort in her right first MTP joint on the plantar surface.  Slight discomfort when trying to bend her toe.  Slight discomfort when pressing on the joint.  Chest pain: Patient notes last week she had 2 to 3 days of intermittent discomfort that was in a very focal spot over her left costochondral joints.  It was dull in nature.  Somewhat worsened by exertion.  Not associated with dyspnea.  No radiation.  No diaphoresis.  Felt like there was a catch there if she had a deep breath.  She does note some tenderness over her costochondral joints.  Social History   Tobacco Use  Smoking Status Former Smoker  .  Years: 33.00  . Last attempt to quit: 07/10/2013  . Years since quitting: 4.9  Smokeless Tobacco Never Used     ROS see history of present illness  Objective  Physical Exam Vitals:   06/16/18 1029 06/16/18 1239  BP: 124/78 134/80  Pulse: 83 73  Temp: 98.4 F (36.9 C)   SpO2: 94%     BP Readings from Last 3 Encounters:  06/16/18 134/80  03/01/18 130/76  02/23/18 118/74   Wt Readings from Last 3 Encounters:  06/16/18 245 lb 12.8 oz (111.5 kg)  03/01/18 248 lb 12.8 oz (112.9 kg)  02/23/18 242 lb 9.6 oz (110 kg)    Physical Exam  Constitutional: No distress.  Cardiovascular: Normal rate, regular rhythm and normal heart sounds.  Pulmonary/Chest: Effort normal and breath sounds normal. She exhibits tenderness (Slight tenderness over the left costochondral joints).  Musculoskeletal: She exhibits no edema.  Slight discomfort over the interface of the tibia and anterior musculature and bilateral lower legs, no apparent edema, no calf tenderness, right first MTP joint with no tenderness, swelling, warmth, or erythema  Neurological: She is alert.  CN 2-12 intact, 5/5 strength in bilateral biceps, triceps, grip, quads, hamstrings, plantar and dorsiflexion, sensation to light touch intact in bilateral UE and LE, normal gait  Skin: Skin is warm and dry. She is not diaphoretic.   EKG: Normal sinus rhythm, rate 74, no ischemic changes noted  Assessment/Plan: Please see individual problem list.  Essential hypertension Adequately controlled.  Check BMP.  Continue  current regimen.  COPD (chronic obstructive pulmonary disease) with emphysema (HCC) Stable.  Continue to follow with pulmonology.  Hyperlipidemia Check LDL.  Chest pain Atypical in nature.  Suspect muscular cause with likely costochondritis.  EKG is reassuring.  Unlikely cardiac though she is due to follow-up with her cardiologist as well and she will contact them to set this appointment up.  Given return  precautions.  Pain in shin Suspect shin splints given the distribution of her discomfort.  Discussed slowing down the rate of walking until this improves.  If not improving she will let us know.  Allergic rhinitis Symptoms are most consistent with allergic rhinitis.  I suspect her frontal headache is related to this.  She will start Flonase daily.  If headache is not improving she will let us know.  If symptoms are not improving she will let us know.  Light headedness Seems to be orthostatic based on her description.  Orthostatics were negative today.  She will monitor for now.  Foot pain Relatively benign exam.  Discussed imaging versus podiatry.  She opted to monitor.   Orders Placed This Encounter  Procedures  . Flu Vaccine QUAD 36+ mos IM  . Direct LDL  . Basic Metabolic Panel (BMET)  . EKG 12-Lead    No orders of the defined types were placed in this encounter.    [name], MD [place]

## 2018-06-16 NOTE — Assessment & Plan Note (Signed)
Check LDL. 

## 2018-06-16 NOTE — Assessment & Plan Note (Signed)
Stable.  Continue to follow with pulmonology.

## 2018-06-17 ENCOUNTER — Telehealth: Payer: Self-pay

## 2018-06-17 ENCOUNTER — Other Ambulatory Visit: Payer: Self-pay | Admitting: Family Medicine

## 2018-06-17 DIAGNOSIS — R42 Dizziness and giddiness: Secondary | ICD-10-CM | POA: Insufficient documentation

## 2018-06-17 DIAGNOSIS — M79673 Pain in unspecified foot: Secondary | ICD-10-CM | POA: Insufficient documentation

## 2018-06-17 NOTE — Assessment & Plan Note (Signed)
Relatively benign exam.  Discussed imaging versus podiatry.  She opted to monitor.

## 2018-06-17 NOTE — Assessment & Plan Note (Signed)
Symptoms are most consistent with allergic rhinitis.  I suspect her frontal headache is related to this.  She will start Flonase daily.  If headache is not improving she will let us know.  If symptoms are not improving she will let us know.

## 2018-06-17 NOTE — Assessment & Plan Note (Signed)
Seems to be orthostatic based on her description.  Orthostatics were negative today.  She will monitor for now.

## 2018-06-17 NOTE — Telephone Encounter (Signed)
Copied from [place] [id number]. Topic: General - Other >> Jun 17, 2018  2:28 PM Yvette Rack wrote: Reason for CRM: pt calling Wilma Flavin back about labs

## 2018-06-18 ENCOUNTER — Other Ambulatory Visit: Payer: Self-pay | Admitting: Cardiovascular Disease

## 2018-06-18 ENCOUNTER — Telehealth: Payer: Self-pay | Admitting: Cardiovascular Disease

## 2018-06-18 ENCOUNTER — Other Ambulatory Visit: Payer: Self-pay | Admitting: Family Medicine

## 2018-06-18 NOTE — Telephone Encounter (Signed)
Lmov for patient to call And schedule appointment  Will try again at a later time

## 2018-06-18 NOTE — Telephone Encounter (Signed)
-----   Message from Anselm Pancoast, Stella sent at 06/18/2018  9:47 AM EDT ----- Please contact patient for a follow up appointment.  Thanks, Ivin Booty

## 2018-06-22 ENCOUNTER — Telehealth: Payer: Self-pay

## 2018-06-22 DIAGNOSIS — E785 Hyperlipidemia, unspecified: Secondary | ICD-10-CM

## 2018-06-22 MED ORDER — ROSUVASTATIN CALCIUM 20 MG PO TABS: 20.0000 mg | ORAL_TABLET | Freq: Every day | ORAL | 3 refills | Status: DC

## 2018-06-22 NOTE — Telephone Encounter (Signed)
Called and spoke with pt. Pt advised and voiced understanding. Pt would like to get start crestor send to [facility]. Sent to [facility].

## 2018-06-22 NOTE — Telephone Encounter (Signed)
Crestor sent to pharmacy.  She needs to have follow-up cholesterol labs completed in 1 month.  Orders have been placed.  Please get her scheduled.

## 2018-06-23 NOTE — Telephone Encounter (Signed)
Called and spoke with pt. Pt advised and voiced understanding. Pt has been scheduled for lab appt.

## 2018-06-24 ENCOUNTER — Other Ambulatory Visit: Payer: Self-pay | Admitting: Family Medicine

## 2018-06-24 ENCOUNTER — Encounter: Payer: Self-pay | Admitting: Cardiovascular Disease

## 2018-06-24 NOTE — Telephone Encounter (Signed)
No ans no vm .   °Mailed Letter  °

## 2018-06-25 ENCOUNTER — Other Ambulatory Visit: Payer: Self-pay | Admitting: Family Medicine

## 2018-06-30 ENCOUNTER — Other Ambulatory Visit: Payer: Self-pay | Admitting: Cardiovascular Disease

## 2018-06-30 DIAGNOSIS — J449 Chronic obstructive pulmonary disease, unspecified: Secondary | ICD-10-CM | POA: Diagnosis not present

## 2018-07-02 ENCOUNTER — Telehealth: Payer: Self-pay | Admitting: Cardiovascular Disease

## 2018-07-02 ENCOUNTER — Other Ambulatory Visit: Payer: Self-pay | Admitting: Cardiovascular Disease

## 2018-07-02 MED ORDER — FUROSEMIDE 20 MG PO TABS: 20.0000 mg | ORAL_TABLET | Freq: Every day | ORAL | 1 refills | Status: DC | PRN

## 2018-07-02 NOTE — Telephone Encounter (Signed)
Patient returning call  Has scheduled an appointment with Dr. Rockey Situ on 11/14 but is out of medication  Would like to know if we will be able to send refills before appointment or if she will have to wait til after appointment Please advise

## 2018-07-02 NOTE — Telephone Encounter (Signed)
I attempted to call the patient. I left a message on her identified voice mail (ok per DPR), that per our office policy, we cannot refill patient's medications if they are not physcially being seen by one of our providers on a regular basis.   I have advised her she may check with Dr. Caryl Bis to see if he will refill her lasix for her if she is unable to see Dr. Rockey Situ at this time.   I asked that she call back with any further questions/ concerns.

## 2018-07-02 NOTE — Telephone Encounter (Signed)
Pt states she had an EKG at Dr. Caryl Bis office and asks if she still needs to follow up with Dr. Rockey Situ. States she needs her Furoseide refilled, but is unable financially to make an appointment to get this medicaiton refilled, she asks if Dr. Rockey Situ can review her visit with her PCP and be able to refill her medication. I did express to patient that we could bill her for her copayment in order for her to make an appointment. Pt declined and would like Dr. Rockey Situ to address this over the phone.

## 2018-07-02 NOTE — Addendum Note (Signed)
Addended by: Vanessa Ralphs on: 07/02/2018 03:19 PM   Modules accepted: Orders

## 2018-07-02 NOTE — Telephone Encounter (Signed)
Patient has appointment scheduled for 08/12/18 with Dr Rockey Situ. She needs enough pills to get her to that appointment. Advised I would send in 30 day supply plus 1 refill and that she must keep appointment for further refills. She verbalized understanding.

## 2018-07-12 ENCOUNTER — Other Ambulatory Visit: Payer: Self-pay | Admitting: Family Medicine

## 2018-07-12 DIAGNOSIS — I491 Atrial premature depolarization: Secondary | ICD-10-CM

## 2018-07-26 ENCOUNTER — Other Ambulatory Visit: Payer: Medicare Other

## 2018-07-27 ENCOUNTER — Ambulatory Visit: Payer: Medicare Other

## 2018-07-27 ENCOUNTER — Other Ambulatory Visit: Payer: Self-pay | Admitting: Family Medicine

## 2018-07-27 ENCOUNTER — Other Ambulatory Visit (INDEPENDENT_AMBULATORY_CARE_PROVIDER_SITE_OTHER): Payer: Medicare Other

## 2018-07-27 DIAGNOSIS — E785 Hyperlipidemia, unspecified: Secondary | ICD-10-CM

## 2018-07-27 LAB — HEPATIC FUNCTION PANEL
ALBUMIN: 4.4 g/dL (ref 3.5–5.2)
ALK PHOS: 60 U/L (ref 39–117)
ALT: 30 U/L (ref 0–35)
AST: 23 U/L (ref 0–37)
BILIRUBIN DIRECT: 0.1 mg/dL (ref 0.0–0.3)
Total Bilirubin: 0.5 mg/dL (ref 0.2–1.2)
Total Protein: 6.8 g/dL (ref 6.0–8.3)

## 2018-07-27 LAB — LDL CHOLESTEROL, DIRECT: Direct LDL: 57 mg/dL

## 2018-07-31 DIAGNOSIS — J449 Chronic obstructive pulmonary disease, unspecified: Secondary | ICD-10-CM | POA: Diagnosis not present

## 2018-08-02 ENCOUNTER — Encounter: Payer: Self-pay | Admitting: Pulmonary Disease

## 2018-08-02 ENCOUNTER — Ambulatory Visit: Payer: Medicare Other | Admitting: Pulmonary Disease

## 2018-08-02 VITALS — BP 146/78 | HR 85 | Ht 66.0 in | Wt 246.3 lb

## 2018-08-02 DIAGNOSIS — Z87891 Personal history of nicotine dependence: Secondary | ICD-10-CM | POA: Diagnosis not present

## 2018-08-02 DIAGNOSIS — J9611 Chronic respiratory failure with hypoxia: Secondary | ICD-10-CM | POA: Diagnosis not present

## 2018-08-02 DIAGNOSIS — J9612 Chronic respiratory failure with hypercapnia: Secondary | ICD-10-CM

## 2018-08-02 DIAGNOSIS — E662 Morbid (severe) obesity with alveolar hypoventilation: Secondary | ICD-10-CM | POA: Diagnosis not present

## 2018-08-02 NOTE — Progress Notes (Signed)
PULMONARY OFFICE FOLLOW UP  PROBLEMS: Chronic hypoxic/hypercarbic respiratory failure Former smoker Moderate COPD OHS CHF  Multiple pulmonary nodules - stable > 2 yrs and deemed benign  INTERVAL HISTORY: Last seen by 05/05/2017. No major pulmonary events since then  SUBJ: This is a scheduled office follow-up.  She has no new complaints.  Since last seen, she has lost 25 pounds.  She has done this mostly with dietary changes and increasing her exercise and activity.  She has minimal exertional dyspnea.  She rarely uses her albuterol rescue inhaler.  She has no significant cough or chest pain.  She denies fever, purulent sputum, lower extremity edema, calf tenderness.  She continues to wear oxygen with sleep at 2 LPM.  She wears oxygen at 1 LPM with exertion.    OBJ: Vitals:   08/02/18 0952  BP: (!) 146/78  Pulse: 85  SpO2: 94%  Weight: 246 lb 4.8 oz (111.7 kg)  Height: 5\' 6"  (1.676 m)  1 lpm [place]  Gen: NAD HEENT: NCAT, sclera white Neck: No JVD Lungs: breath sounds mildly diminished without wheezes or other adventitious sounds Cardiovascular: RRR, no murmurs Abdomen: Soft, nontender, normal BS Ext: without clubbing, cyanosis, edema Neuro: grossly intact Skin: Limited exam, no lesions noted    DATA: BMP Latest Ref Rng & Units 06/16/2018 11/17/2017 08/07/2017  Glucose 70 - 99 mg/dL 95 106(H) 112(H)  BUN 6 - 23 mg/dL 12 13 11   Creatinine 0.40 - 1.20 mg/dL 0.61 0.63 0.63  BUN/Creat Ratio 9 - 23 - - -  Sodium 135 - 145 mEq/L 139 139 141  Potassium 3.5 - 5.1 mEq/L 3.6 4.0 4.1  Chloride 96 - 112 mEq/L 99 99 100  CO2 19 - 32 mEq/L 33(H) 35(H) 33(H)  Calcium 8.4 - 10.5 mg/dL 9.6 9.4 10.1   CBC Latest Ref Rng & Units 08/07/2017 08/26/2016 09/30/2015  WBC 4.0 - 10.5 K/uL 7.0 8.0 7.5  Hemoglobin 12.0 - 15.0 g/dL 13.8 14.1 14.6  Hematocrit 36.0 - 46.0 % 43.0 42.8 47.5(H)  Platelets 150.0 - 400.0 K/uL 291.0 295.0 225   CXR 5/17: Chronic interstitial prominence.  Chronic scarring in  LLL.  No acute findings  IMPRESSION: Chronic hypoxic/hypercarbic respiratory failure Former smoker Moderate COPD Obesity hypoventilation syndrome DOE -much improved with weight loss and increasing activity Multiple pulmonary nodules - stable > 2 yrs and deemed benign  PLAN: 1) Continue Dulera and Spiriva 2) Continue PRN albuterol 3) Continue oxygen therapy - 1 lpm with exertion, 2 lpm [place] with sleep 4) I congratulated her on her excellent efforts at weight loss and encouraged her to continue these efforts 5) follow-up in 1 year.  She is to call sooner as needed for any respiratory problems  Merton Border, MD PCCM service Mobile [phone] Pager [phone] 08/02/2018 10:09 AM

## 2018-08-02 NOTE — Patient Instructions (Signed)
1) Continue Dulera and Spiriva 2) Continue albuterol inhaler as needed 3) Continue oxygen therapy - 1 lpm with exertion, 2 lpm [place] with sleep 4) continue your excellent efforts at weight loss 5) follow-up in 1 year.  Call sooner as needed for any breathing or pulmonary problems

## 2018-08-10 NOTE — Progress Notes (Signed)
Cardiology Office Note  Date:  08/12/2018   ID:  Martha Woodard, Martha Woodard 1966/04/22, MRN [id number]  PCP:  Martha Haven, MD   Chief Complaint  Patient presents with  . other    12 month f/u no complaints today. Meds reviewed verbally with pt.    HPI:  [name] is a very pleasant 52 year old woman with a history of  diabetes,  asthma,  COPD, Quit smoking 2014, smoked for >30 yrs lung nodules  hospital for acute respiratory distress, 20 pound weight gain, hypoxia, diagnosed with COPD exacerbation and acute diastolic CHF  Chronic LE edema>L obesity hypoventilation she has follow-up   In follow-up today she reports that she is doing very well Weight is down at least 30 pounds from prior clinic visit Changed her diet, less carbohydrates Continues to take Lasix daily, with HCTZ Potassium 10 mEq daily Foot swelling gone,  Orthostatic once every 2 weeks, not  severe here Continues to use oxygen Seen by pulmonary, they indicated to her that they would likely wean over the next year as tolerated  Lab work reviewed LDL 57  Potassium 3.5  stopped smoking 06/2013  EKG personally reviewed by myself on todays visit Shows normal sinus rhythm with rate 79 bpm no significant ST or T wave changes  Other past medical history reviewed Prior episode of hypoxic respiratory distress Previous CT chest  Bilateral lower lobe atelectasis, pulmonary edema versus interstitial pneumonitis superimposed on changes of COPD and right lung nodules   initiated on steroids, antibiotics, inhalation therapy as well as diuretics.    hypoxic intermittently, especially at nighttime and sitting in the bed, concerning for obesity hypoventilation syndrome.    PMH:   has a past medical history of CHF (congestive heart failure) ([place]), Colon polyp (01-01-2016), COPD (chronic obstructive pulmonary disease) ([place]), Diabetes mellitus without complication ([place]), Diastolic heart failure ([place]), HLD  (hyperlipidemia), Hypertension, Morbid obesity with BMI of 45.0-49.9, adult ([place]), Multiple lung nodules on CT, and Shortness of breath dyspnea.  PSH:    Past Surgical History:  Procedure Laterality Date  . BIOPSY THYROID Right 02-06-14   NEGATIVE FOR MALIGNANT CELLS proteinaceous material and macrophages.  Marland Kitchen BREAST BIOPSY Left    neg- core  . COLONOSCOPY WITH PROPOFOL N/A 01/01/2016   Procedure: COLONOSCOPY WITH PROPOFOL;  Surgeon: Lucilla Lame, MD;  Location: [facility] ENDOSCOPY;  Service: Endoscopy;  Laterality: N/A;  . thryoid fna Right April 2015   Proteinaceous material and macrophages.  . WISDOM TOOTH EXTRACTION      Current Outpatient Medications  Medication Sig Dispense Refill  . ACCU-CHEK AVIVA PLUS test strip TEST UP TO 4 TIMES A DAY AS DIRECTED 100 each 4  . ACCU-CHEK SOFTCLIX LANCETS lancets TEST UP TO 4 TIMES A DAY 100 each 3  . albuterol (PROVENTIL) (2.5 MG/3ML) 0.083% nebulizer solution Take 3 mLs (2.5 mg total) by nebulization every 6 (six) hours as needed for wheezing or shortness of breath. 75 mL 12  . aspirin EC 81 MG tablet Take 1 tablet (81 mg total) by mouth daily. 90 tablet 3  . blood glucose meter kit and supplies Dispense based on patient and insurance preference. Use up to four times daily as directed. (FOR ICD-9 250.00, 250.01). 1 each 0  . fluticasone (FLONASE) 50 MCG/ACT nasal spray USE 1 OR 2 SPRAYS IN EACH NOSTRIL ONCE DAILY. 16 g 12  . furosemide (LASIX) 20 MG tablet Take 1 tablet (20 mg total) by mouth daily as needed. Please keep upcoming appointment for  further refills! Thanks! 30 tablet 1  . hydrochlorothiazide (MICROZIDE) 12.5 MG capsule TAKE 1 CAPSULE BY MOUTH ONCE DAILY 90 capsule 0  . losartan (COZAAR) 25 MG tablet TAKE 1 TABLET BY MOUTH TWICE DAILY 180 tablet 1  . metFORMIN (GLUCOPHAGE) 500 MG tablet TAKE 2 TABLETS BY MOUTH TWICE DAILY WITHMEALS 180 tablet 1  . metoprolol succinate (TOPROL-XL) 25 MG 24 hr tablet TAKE 1 TABLET BY MOUTH ONCE DAILY 90 tablet  1  . mometasone-formoterol (DULERA) 100-5 MCG/ACT AERO INHALE 2 PUFFS INTO LUNG THE LUNGS TWICEDAILY 39 g 3  . Multiple Vitamin (MULTIVITAMIN) tablet Take 1 tablet by mouth daily.    Marland Kitchen nystatin (MYCOSTATIN) 100000 UNIT/ML suspension Take 5 mLs (500,000 Units total) by mouth 4 (four) times daily. Swish and swallow 120 mL 0  . nystatin cream (MYCOSTATIN) Apply 1 application topically 2 (two) times daily. 30 g 0  . OXYGEN Inhale 2 L into the lungs daily. 2 liters at night, 1 liter when doing activities, and no oxygen when at home during the day.    . potassium chloride (K-DUR) 10 MEQ tablet TAKE 1 TABLET BY MOUTH ONCE DAILY 30 tablet 0  . rosuvastatin (CRESTOR) 20 MG tablet Take 1 tablet (20 mg total) by mouth daily. 90 tablet 3  . tiotropium (SPIRIVA) 18 MCG inhalation capsule Place 1 capsule (18 mcg total) into inhaler and inhale daily. 90 capsule 3  . VENTOLIN HFA 108 (90 BASE) MCG/ACT inhaler INHALE 2 PUFFS BY MOUTH 4 TIMES DAILY AS NEEDED. 18 g 2   No current facility-administered medications for this visit.      Allergies:   Augmentin [amoxicillin-pot clavulanate]   Social History:  The patient  reports that she quit smoking about 5 years ago. She quit after 33.00 years of use. She has never used smokeless tobacco. She reports that she does not drink alcohol or use drugs.   Family History:   family history includes Breast cancer (age of onset: 3) in her sister; Breast cancer (age of onset: 49) in her sister; Cancer (age of onset: 38) in her mother; Goiter in her mother; Heart murmur in her sister; Stroke in her brother and father.    Review of Systems: Review of Systems  Constitutional: Positive for weight loss.  Respiratory: Positive for shortness of breath.   Cardiovascular: Positive for leg swelling.  Gastrointestinal: Negative.   Musculoskeletal: Negative.   Neurological: Negative.   Psychiatric/Behavioral: Negative.   All other systems reviewed and are  negative.    PHYSICAL EXAM: VS:  BP 122/64 (BP Location: Left Arm, Patient Position: Sitting, Cuff Size: Large)   Pulse 79   Ht _0  (1.676 m)   Wt 246 lb 8 oz (111.8 kg)   BMI 39.79 kg/m  , BMI Body mass index is 39.79 kg/m. Constitutional:  oriented to person, place, and time. No distress.  Obese HENT:  Head: Grossly normal Eyes:  no discharge. No scleral icterus.  Neck: No JVD, no carotid bruits  Cardiovascular: Regular rate and rhythm, no murmurs appreciated Pulmonary/Chest: Clear to auscultation bilaterally, no wheezes or rails Abdominal: Soft.  no distension.  no tenderness.  Musculoskeletal: Normal range of motion Neurological:  normal muscle tone. Coordination normal. No atrophy Skin: Skin warm and dry Psychiatric: normal affect, pleasant  Recent Labs: 06/16/2018: BUN 12; Creatinine, Ser 0.61; Potassium 3.6; Sodium 139 07/27/2018: ALT 30    Lipid Panel Lab Results  Component Value Date   CHOL 181 11/17/2017   HDL 39.00 (L) 11/17/2017  LDLCALC 113 (H) 11/17/2017   TRIG 141.0 11/17/2017      Wt Readings from Last 3 Encounters:  08/12/18 246 lb 8 oz (111.8 kg)  08/02/18 246 lb 4.8 oz (111.7 kg)  06/16/18 245 lb 12.8 oz (111.5 kg)     ASSESSMENT AND PLAN:   Chronic diastolic heart failure (HCC) - Plan: EKG 12-Lead On HCTZ and Lasix daily Stable renal function Recommend she increase potassium up to 10 twice daily She was skipped Lasix on days with no leg edema  Essential hypertension - Plan: EKG 12-Lead With recent 30 pound weight loss now with good blood pressures, Occasional orthostasis symptoms Stressed the importance of not getting dehydrated with too much Lasix If she has worsening orthostasis symptoms she will call our office for further medication adjustment  Mixed hyperlipidemia Total cholesterol 180 Should improve with weight loss  Type 2 diabetes mellitus with other circulatory complication, without long-term current use of insulin  (HCC) Weight trending downward Lost 30 pounds over the past year  Morbid obesity with BMI of 45.0-49.9, adult ([place]) She has done remarkably well with weight loss 30 pounds Through dietary changes  Centrilobular emphysema ([place]) Requiring very small amounts of oxygen Currently nonsmoker We will likely wean off oxygen over the next year if able to continue to lose weight   Total encounter time more than 25 minutes  Greater than 50% was spent in counseling and coordination of care with the patient   Disposition:   F/U  12 months   Orders Placed This Encounter  Procedures  . EKG 12-Lead     Signed, Esmond Plants, M.D., Ph.D. 08/12/2018  [place], [place]

## 2018-08-12 ENCOUNTER — Encounter: Payer: Self-pay | Admitting: Cardiovascular Disease

## 2018-08-12 ENCOUNTER — Ambulatory Visit: Payer: Medicare Other | Admitting: Cardiovascular Disease

## 2018-08-12 VITALS — BP 122/64 | HR 79 | Ht 66.0 in | Wt 246.5 lb

## 2018-08-12 DIAGNOSIS — I5032 Chronic diastolic (congestive) heart failure: Secondary | ICD-10-CM | POA: Diagnosis not present

## 2018-08-12 DIAGNOSIS — I491 Atrial premature depolarization: Secondary | ICD-10-CM | POA: Diagnosis not present

## 2018-08-12 DIAGNOSIS — E662 Morbid (severe) obesity with alveolar hypoventilation: Secondary | ICD-10-CM

## 2018-08-12 DIAGNOSIS — I10 Essential (primary) hypertension: Secondary | ICD-10-CM

## 2018-08-12 DIAGNOSIS — E1159 Type 2 diabetes mellitus with other circulatory complications: Secondary | ICD-10-CM | POA: Diagnosis not present

## 2018-08-12 DIAGNOSIS — E782 Mixed hyperlipidemia: Secondary | ICD-10-CM | POA: Diagnosis not present

## 2018-08-12 DIAGNOSIS — I872 Venous insufficiency (chronic) (peripheral): Secondary | ICD-10-CM

## 2018-08-12 DIAGNOSIS — J432 Centrilobular emphysema: Secondary | ICD-10-CM

## 2018-08-12 MED ORDER — METOPROLOL SUCCINATE ER 25 MG PO TB24: 25.0000 mg | ORAL_TABLET | Freq: Every day | ORAL | 2 refills | Status: DC

## 2018-08-12 MED ORDER — HYDROCHLOROTHIAZIDE 12.5 MG PO CAPS: 12.5000 mg | ORAL_CAPSULE | Freq: Every day | ORAL | 2 refills | Status: DC

## 2018-08-12 MED ORDER — POTASSIUM CHLORIDE ER 10 MEQ PO TBCR: 10.0000 meq | EXTENDED_RELEASE_TABLET | Freq: Two times a day (BID) | ORAL | 3 refills | Status: DC

## 2018-08-12 MED ORDER — FUROSEMIDE 20 MG PO TABS: 20.0000 mg | ORAL_TABLET | Freq: Every day | ORAL | 2 refills | Status: DC | PRN

## 2018-08-12 MED ORDER — LOSARTAN POTASSIUM 25 MG PO TABS: 25.0000 mg | ORAL_TABLET | Freq: Two times a day (BID) | ORAL | 2 refills | Status: DC

## 2018-08-12 NOTE — Patient Instructions (Signed)
Medication Instructions:   Take two potassium when you take lasix   If you need a refill on your cardiac medications before your next appointment, please call your pharmacy.    Lab work: No new labs needed   If you have labs (blood work) drawn today and your tests are completely normal, you will receive your results only by: Marland Kitchen MyChart Message (if you have MyChart) OR . A paper copy in the mail If you have any lab test that is abnormal or we need to change your treatment, we will call you to review the results.   Testing/Procedures: No new testing needed   Follow-Up: At [facility], you and your health needs are our priority.  As part of our continuing mission to provide you with exceptional heart care, we have created designated Provider Care Teams.  These Care Teams include your primary Cardiologist (physician) and Advanced Practice Providers (APPs -  Physician Assistants and Nurse Practitioners) who all work together to provide you with the care you need, when you need it.  . You will need a follow up appointment in 12 months .   Please call our office 2 months in advance to schedule this appointment.    . Providers on your designated Care Team:   . Murray Hodgkins, NP . Christell Faith, PA-C . Marrianne Mood, PA-C  Any Other Special Instructions Will Be Listed Below (If Applicable).  For educational health videos Log in to : [email or web address] Or : [email or web address], password : [email or web address]

## 2018-08-16 ENCOUNTER — Other Ambulatory Visit: Payer: Self-pay | Admitting: Pharmacy Technician

## 2018-08-16 NOTE — Patient Outreach (Signed)
[place] [facility]) Care Management  08/16/2018  [name] [birth year] [id number]   Addendum:  Patient returned my call. Patient expressed interest in the 2020 medication assistance process. Informed patient to be on the lookout for the applications in the mail in the next few weeks. Patient verbalized understanding.  Knoah Nedeau P. Novalee Horsfall, [place] Management [phone]

## 2018-08-16 NOTE — Patient Outreach (Signed)
[place] [facility]) Care Management  08/16/2018  MADELLYN DENIO 07-Jan-1966 [id number]  Unsuccessful call placed to patient regarding 2020 patient assistance process.  Unfortunately patient did not answer phone. HIPAA compliant voicemail left for patient to return the call.  Lan Entsminger P. Danaye Sobh, [place] Management [phone]

## 2018-08-18 ENCOUNTER — Other Ambulatory Visit: Payer: Self-pay | Admitting: Pharmacy Technician

## 2018-08-18 ENCOUNTER — Encounter: Payer: Self-pay | Admitting: Pharmacy Technician

## 2018-08-18 NOTE — Patient Outreach (Signed)
[facility]) Care Management  08/18/2018  COLLYN SELK 08/16/1966 [id number]   Received patient referral for 2020 patient assistance for [employer] for [place] and for [employer] for [employer].  Prepared patient poriton to be mailed. Faxed provider portion to Dr. Merton Border for [facility] and mailed [employer] provider portion .  Will followup with patient in 7-10 business days to inquire if application has been received.  Jeffre Enriques P. Kailoni Vahle, [place] Management [phone]

## 2018-08-30 ENCOUNTER — Other Ambulatory Visit: Payer: Self-pay | Admitting: Pharmacy Technician

## 2018-08-30 DIAGNOSIS — J449 Chronic obstructive pulmonary disease, unspecified: Secondary | ICD-10-CM | POA: Diagnosis not present

## 2018-08-30 NOTE — Patient Outreach (Signed)
[facility]) Care Management  08/30/2018  Martha Woodard 07-13-1966 [id number]   Unsuccessful outgoing call placed to patient regarding her medication assistance applications of Dulera, Proventil & Spiriva through [employer] & [employer].  Unfortunately patient did not answer the phone.  HIPAA compliant voicemail left.  Will followup in  3-5 business days to inquire if patient has received the applications.  Greenley Martone P. Anette Barra, [place] Management [phone]

## 2018-08-30 NOTE — Patient Outreach (Signed)
[place]) Care Management  08/30/2018  Martha Woodard December 17, 1965 [id number]    ADDENDUM:  Incoming call received from patient. HIPAA identifiers obtained.  Patient called to ask questions about her patient assistance applications. We went over the applications and patient verbalized understanding. Patient states she will put in the mail today.  Will followup with patient in 7-10 business days if applications have not been received.  Lochlyn Zullo P. Cliffie Gingras, [place] Management [phone]

## 2018-09-08 ENCOUNTER — Telehealth: Payer: Self-pay | Admitting: Family Medicine

## 2018-09-08 NOTE — Telephone Encounter (Signed)
Pt dropped off disability property tax form to be filled out. Placed in Dr. Tharon Aquas color folder upfront. Pt would like this mailed to her when completed

## 2018-09-08 NOTE — Telephone Encounter (Signed)
Placed on PCP's desk.

## 2018-09-09 NOTE — Telephone Encounter (Signed)
Completed.

## 2018-09-10 ENCOUNTER — Other Ambulatory Visit: Payer: Self-pay | Admitting: Pharmacy Technician

## 2018-09-10 NOTE — Patient Outreach (Signed)
[facility]) Care Management  09/10/2018  Martha Woodard 1966-01-12 [id number]   Unsucessful outreach call placed to patient in regards to her medication assistance applications for Dulera, Proventil and Spiriva through [employer].  Unfortunately patient did not answer the phone, HIPAA compliant voicemail left.  Will followup in 2-3 business days if call is not returned.  [name] P. Future [place], [place] Management [phone]

## 2018-09-10 NOTE — Telephone Encounter (Signed)
Called and spoke with patient. Pt advised and voiced understanding. Pt asked to have paper work mailed to her. Copy placed in my yellow folder and a copy placed to be scanned into pt's chart.

## 2018-09-10 NOTE — Patient Outreach (Signed)
[facility]) Care Management  09/10/2018  Martha Woodard 08-02-1966 [id number]    ADDENDUM  Incoming call received from patient in regards to her patient assistance applications. HIPAA identifiers obtained.  [name] was returning my call. Inquired if she had mailed in the applications as stated in previous note from 12/2. Patient informed me that she had mailed in the applications last week. Informed patient hat our mail has to go to hospital before it comes to [place] and that sometimes makes receiving the mail delayed.  Informed patient that I would be on the lookout for the application and call her back if there were any problems.  Will followup with patien in 7-10 business days if application has not been received.  Berdella Bacot P. Shalah Estelle, [place] Management [phone]

## 2018-09-15 ENCOUNTER — Other Ambulatory Visit: Payer: Self-pay | Admitting: Pharmacy Technician

## 2018-09-15 NOTE — Patient Outreach (Signed)
[facility]) Care Management  09/15/2018  Martha Woodard 02/27/1966 [id number]    Received all necessary documents and signatures from both patient and provider in regards to B-I (Spiriva) and [employer]ck Ruthe Mannan & Proventil HFA).  Submitted completed application via mail for [employer] and fax for [employer].  Will followup in 7-14 business days with the companies to check on application status and see if attestation letter was mailed to patient for Merck.  Derin Granquist P. Boykin Baetz, [place] Management [phone]

## 2018-09-17 ENCOUNTER — Ambulatory Visit (INDEPENDENT_AMBULATORY_CARE_PROVIDER_SITE_OTHER): Payer: Medicare Other | Admitting: Family Medicine

## 2018-09-17 ENCOUNTER — Encounter: Payer: Self-pay | Admitting: Family Medicine

## 2018-09-17 VITALS — BP 122/80 | HR 86 | Temp 99.0°F | Ht 66.0 in | Wt 255.4 lb

## 2018-09-17 DIAGNOSIS — J019 Acute sinusitis, unspecified: Secondary | ICD-10-CM

## 2018-09-17 MED ORDER — DOXYCYCLINE HYCLATE 100 MG PO TABS: 100.0000 mg | ORAL_TABLET | Freq: Two times a day (BID) | ORAL | 0 refills | Status: DC

## 2018-09-17 NOTE — Progress Notes (Signed)
Subjective:    Patient ID: Martha Woodard, female    DOB: 11-10-1965, 52 y.o.   MRN: [id number]  HPI   Patient presents to clinic complaining of sinus pressure, headache, ear aches, pressure above teeth, thick nasal drainage when blowing her nose for past 2 weeks.  She has been taking her Flonase and over-the-counter allergy medicine every day, but symptoms do not seem to be improving.  Denies fever or chills.  Denies any nausea, vomiting or diarrhea.  Denies wheezing or shortness of breath.  Patient Active Problem List   Diagnosis Date Noted  . Light headedness 06/17/2018  . Foot pain 06/17/2018  . Chest pain 06/16/2018  . Pain in shin 06/16/2018  . Bruise 02/23/2018  . Breast pain 02/09/2018  . Allergic rhinitis 11/09/2017  . Venous insufficiency 08/07/2017  . Chronic respiratory failure ([place]) 11/13/2016  . Skin cyst 08/19/2016  . Thyroid nodule 11/28/2015  . COPD (chronic obstructive pulmonary disease) with emphysema ([place]) 11/13/2015  . Chronic diastolic heart failure ([place]) 11/13/2015  . COPD exacerbation ([place]) 10/01/2015  . Morbid obesity with BMI of 45.0-49.9, adult ([place]) 10/01/2015  . Obesity hypoventilation syndrome ([place]) 10/01/2015  . Eczema 10/01/2015  . Essential hypertension 10/01/2015  . DM type 2 (diabetes mellitus, type 2) ([place]) 06/26/2014  . Hyperlipidemia 06/26/2014  . Lung nodule seen on imaging study 02/06/2014   Social History   Tobacco Use  . Smoking status: Former Smoker    Years: 33.00    Last attempt to quit: 07/10/2013    Years since quitting: 5.1  . Smokeless tobacco: Never Used  Substance Use Topics  . Alcohol use: No    Alcohol/week: 0.0 standard drinks   Review of Systems  Constitutional: Negative for chills, fatigue and fever.  HENT: +congestion, ear pain, sinus pain/drainage, sinus headache   Eyes: Negative.   Respiratory: Negative for cough, shortness of breath and wheezing.   Cardiovascular: Negative for chest pain, palpitations and  leg swelling.  Gastrointestinal: Negative for abdominal pain, diarrhea, nausea and vomiting.  Genitourinary: Negative for dysuria, frequency and urgency.  Musculoskeletal: Negative for arthralgias and myalgias.  Skin: Negative for color change, pallor and rash.  Neurological: Negative for syncope, light-headedness and headaches.  Psychiatric/Behavioral: The patient is not nervous/anxious.       Objective:   Physical Exam Vitals signs and nursing note reviewed.  Constitutional:      Appearance: She is not toxic-appearing or diaphoretic.  HENT:     Head: Normocephalic and atraumatic.     Nose: Nasal tenderness, mucosal edema, congestion and rhinorrhea present.     Right Sinus: Maxillary sinus tenderness and frontal sinus tenderness present.     Left Sinus: Maxillary sinus tenderness and frontal sinus tenderness present.     Comments: +fullness bilat TMs. +thick yellow nasal discharge    Mouth/Throat:     Mouth: Mucous membranes are moist.     Tongue: No lesions.     Pharynx: Oropharynx is clear. Uvula midline. No pharyngeal swelling or oropharyngeal exudate.  Eyes:     General: No scleral icterus.    Extraocular Movements: Extraocular movements intact.     Conjunctiva/sclera: Conjunctivae normal.  Neurological:     Mental Status: She is alert.    Vitals:   09/17/18 0807  BP: 122/80  Pulse: 86  Temp: 99 F (37.2 C)  SpO2: 90%      Assessment & Plan:   Acute sinusitis - patient will take doxycycline twice daily for 10 days.  Patient is unable to tolerate Augmentin, so this is why doxycycline was chosen.  Advised to begin her Flonase and over-the-counter allergy medicine to help reduce congestion and keep nasal passages clear.  Also suggested she can try saline nasal flushes to rinse out sinus congestion.  She can use Tylenol as needed to help reduce any pain, and has been advised to rest, increase fluids, do good handwashing.  Patient will keep regularly scheduled follow-up  with PCP as planned.  Advised to return to clinic sooner if any issues arise.

## 2018-09-17 NOTE — Patient Instructions (Addendum)

## 2018-09-20 ENCOUNTER — Encounter: Payer: Self-pay | Admitting: Family Medicine

## 2018-09-20 ENCOUNTER — Ambulatory Visit (INDEPENDENT_AMBULATORY_CARE_PROVIDER_SITE_OTHER): Payer: Medicare Other | Admitting: Family Medicine

## 2018-09-20 VITALS — BP 142/80 | HR 91 | Temp 98.9°F | Ht 66.0 in | Wt 254.6 lb

## 2018-09-20 DIAGNOSIS — R110 Nausea: Secondary | ICD-10-CM

## 2018-09-20 DIAGNOSIS — J019 Acute sinusitis, unspecified: Secondary | ICD-10-CM

## 2018-09-20 DIAGNOSIS — R0982 Postnasal drip: Secondary | ICD-10-CM

## 2018-09-20 DIAGNOSIS — J3489 Other specified disorders of nose and nasal sinuses: Secondary | ICD-10-CM

## 2018-09-20 MED ORDER — ONDANSETRON 4 MG PO TBDP: 4.0000 mg | ORAL_TABLET | Freq: Three times a day (TID) | ORAL | 0 refills | Status: DC | PRN

## 2018-09-20 MED ORDER — PREDNISONE 10 MG (21) PO TBPK: ORAL_TABLET | ORAL | 0 refills | Status: DC

## 2018-09-20 MED ORDER — AZITHROMYCIN 250 MG PO TABS: ORAL_TABLET | ORAL | 0 refills | Status: DC

## 2018-09-20 NOTE — Progress Notes (Signed)
Subjective:    Patient ID: Martha Woodard, female    DOB: 11-30-1965, 52 y.o.   MRN: [id number]  HPI  Presents to clinic to follow up on congestion/sinusitis. Diagnosed with sinusitis on 09/17/18 and started on Doxycycline 100 mg course BID, course is for 10 days.  Patient states she continues to feel very congestion in her face, pain around eyes and above teeth.  Also feels nasal drainage has gotten thicker and is running down the back of her throat.  Patient believes she is swallowing some of her phlegm as she does feel nauseous.  Does have a cough, but is not coughing up phlegm.  Denies feeling wheezy, but did do a breathing treatment yesterday to see if this would help her feel better overall.  Denies any fever or chills.  Has been using her Flonase nasal spray as prescribed.  Patient Active Problem List   Diagnosis Date Noted  . Light headedness 06/17/2018  . Foot pain 06/17/2018  . Chest pain 06/16/2018  . Pain in shin 06/16/2018  . Bruise 02/23/2018  . Breast pain 02/09/2018  . Allergic rhinitis 11/09/2017  . Venous insufficiency 08/07/2017  . Chronic respiratory failure ([place]) 11/13/2016  . Skin cyst 08/19/2016  . Thyroid nodule 11/28/2015  . COPD (chronic obstructive pulmonary disease) with emphysema ([place]) 11/13/2015  . Chronic diastolic heart failure ([place]) 11/13/2015  . COPD exacerbation ([place]) 10/01/2015  . Morbid obesity with BMI of 45.0-49.9, adult ([place]) 10/01/2015  . Obesity hypoventilation syndrome ([place]) 10/01/2015  . Eczema 10/01/2015  . Essential hypertension 10/01/2015  . DM type 2 (diabetes mellitus, type 2) ([place]) 06/26/2014  . Hyperlipidemia 06/26/2014  . Lung nodule seen on imaging study 02/06/2014   Social History   Tobacco Use  . Smoking status: Former Smoker    Years: 33.00    Last attempt to quit: 07/10/2013    Years since quitting: 5.2  . Smokeless tobacco: Never Used  Substance Use Topics  . Alcohol use: No    Alcohol/week: 0.0 standard drinks      Review of Systems  Constitutional: Negative for chills, fatigue and fever.  HENT: +congestion, ear pain, sinus pain,thick sinus drainage.   Eyes: Negative.   Respiratory: +cough. Negative for shortness of breath and wheezing.   Cardiovascular: Negative for chest pain, palpitations and leg swelling.  Gastrointestinal: Negative for abdominal pain, diarrhea, nausea and vomiting.  Genitourinary: Negative for dysuria, frequency and urgency.  Musculoskeletal: Negative for arthralgias and myalgias.  Skin: Negative for color change, pallor and rash.  Neurological: Negative for syncope, light-headedness and headaches.  Psychiatric/Behavioral: The patient is not nervous/anxious.       Objective:   Physical Exam Vitals signs and nursing note reviewed.  Constitutional:      Appearance: She is not toxic-appearing or diaphoretic.  HENT:     Nose: Nasal tenderness, mucosal edema, congestion and rhinorrhea present.     Right Turbinates: Swollen.     Left Turbinates: Swollen.     Right Sinus: Maxillary sinus tenderness and frontal sinus tenderness present.     Left Sinus: Maxillary sinus tenderness and frontal sinus tenderness present.     Comments: +fullness bilateral TMs. +postnasal drip    Mouth/Throat:     Mouth: Mucous membranes are moist.  Eyes:     General: No scleral icterus.    Extraocular Movements: Extraocular movements intact.     Conjunctiva/sclera: Conjunctivae normal.  Neck:     Musculoskeletal: Normal range of motion and neck supple.  Cardiovascular:  Rate and Rhythm: Normal rate and regular rhythm.  Lymphadenopathy:     Cervical: No cervical adenopathy.  Neurological:     Mental Status: She is alert.     Vitals:   09/20/18 1042  BP: (!) 142/80  Pulse: 91  Temp: 98.9 F (37.2 C)  SpO2: 91%      Assessment & Plan:   Sinusitis, sinus pain, postnasal drainage-patient will continue doxycycline course and will add a Z-Pak as well.  She will also take a  steroid taper and continue to use her Flonase to help open up nasal congestion and improve inflammation in sinuses and in nasal passages.  Advised to continue to rest, increase fluid intake and do good handwashing.  Nausea - I suspect nausea is related to postnasal drip/possible swallowing of phlegm.  Patient will use Zofran as needed to combat nausea.  Patient will keep regularly scheduled follow-up with PCP as planned.  Advised to return to clinic sooner if any issues arise

## 2018-09-27 ENCOUNTER — Other Ambulatory Visit: Payer: Self-pay | Admitting: Pharmacy Technician

## 2018-09-27 NOTE — Patient Outreach (Signed)
[facility]) Care Management  09/27/2018  Martha Woodard 1965-12-18 [id number]  Care coordination calls placed to patient's medication assistance companies [place] for Spiriva and [employer]ck for [employer] and Proventil.  Spoke to [place] at [employer] and she says the application has not shown up in their system yet and to check back in 7-10 business days.  Spoke to [place] at [place] and she says the application was missing the proof of income even though informed her that it had been faxed in twice. She suggested faxing it again to [phone]. Will fax again and will follow up in 5-7 days.  Plan to followup in 5-10 business days with both patient assistance programs to inquire if a determination has been made.  Jacion Dismore P. Estreya Clay, [place] Management [phone]

## 2018-10-01 ENCOUNTER — Ambulatory Visit (INDEPENDENT_AMBULATORY_CARE_PROVIDER_SITE_OTHER): Payer: Medicare Other | Admitting: Family Medicine

## 2018-10-01 ENCOUNTER — Telehealth: Payer: Self-pay | Admitting: Family Medicine

## 2018-10-01 ENCOUNTER — Encounter: Payer: Self-pay | Admitting: Family Medicine

## 2018-10-01 ENCOUNTER — Other Ambulatory Visit: Payer: Self-pay | Admitting: Cardiovascular Disease

## 2018-10-01 ENCOUNTER — Telehealth: Payer: Self-pay

## 2018-10-01 VITALS — BP 105/68 | HR 89 | Temp 98.2°F | Ht 66.0 in | Wt 256.4 lb

## 2018-10-01 DIAGNOSIS — J0100 Acute maxillary sinusitis, unspecified: Secondary | ICD-10-CM | POA: Diagnosis not present

## 2018-10-01 DIAGNOSIS — E1159 Type 2 diabetes mellitus with other circulatory complications: Secondary | ICD-10-CM | POA: Diagnosis not present

## 2018-10-01 DIAGNOSIS — L219 Seborrheic dermatitis, unspecified: Secondary | ICD-10-CM

## 2018-10-01 DIAGNOSIS — N644 Mastodynia: Secondary | ICD-10-CM | POA: Diagnosis not present

## 2018-10-01 LAB — HEMOGLOBIN A1C: Hgb A1c MFr Bld: 6.2 % (ref 4.6–6.5)

## 2018-10-01 MED ORDER — KETOCONAZOLE 2 % EX CREA: 1.0000 "application " | TOPICAL_CREAM | Freq: Every day | CUTANEOUS | 0 refills | Status: DC

## 2018-10-01 MED ORDER — LOSARTAN POTASSIUM 25 MG PO TABS: 25.0000 mg | ORAL_TABLET | Freq: Two times a day (BID) | ORAL | 3 refills | Status: DC

## 2018-10-01 MED ORDER — CEFPODOXIME PROXETIL 200 MG PO TABS: 200.0000 mg | ORAL_TABLET | Freq: Two times a day (BID) | ORAL | 0 refills | Status: DC

## 2018-10-01 MED ORDER — KETOCONAZOLE 2 % EX SHAM: 1.0000 "application " | MEDICATED_SHAMPOO | CUTANEOUS | 0 refills | Status: DC

## 2018-10-01 MED ORDER — FUROSEMIDE 20 MG PO TABS: 20.0000 mg | ORAL_TABLET | Freq: Every day | ORAL | 2 refills | Status: DC | PRN

## 2018-10-01 MED ORDER — LEVOFLOXACIN 500 MG PO TABS: 500.0000 mg | ORAL_TABLET | Freq: Every day | ORAL | 0 refills | Status: DC

## 2018-10-01 NOTE — Telephone Encounter (Signed)
Called patient and left a detailed VM that you do plan to send in an alternative antibiotic later this evening. Sent to PCP.

## 2018-10-01 NOTE — Telephone Encounter (Signed)
Please call the patient and let her know that I am going to send in an alternative antibiotic and it should be there this afternoon.

## 2018-10-01 NOTE — Telephone Encounter (Signed)
°*  STAT* If patient is at the pharmacy, call can be transferred to refill team.   1. Which medications need to be refilled? (please list name of each medication and dose if known) Furosemide 20 mg po q d prn 2. Which pharmacy/location (including street and city if local pharmacy) is medication to [facility]   3. Do they need a 30 day or 90 day supply? [place]!

## 2018-10-01 NOTE — Telephone Encounter (Signed)
Sent to PCP to please advise alternative option.   Thanks

## 2018-10-01 NOTE — Progress Notes (Signed)
[name], MD Phone: [phone]  Martha Woodard is a 53 y.o. female who presents today for follow up.  CC: Sinusitis, rash, diabetes, breast pain  Sinusitis: This has been going on for 3 weeks.  She has completed doxycycline and azithromycin.  She notes continued sinus pressure.  She is not able to get anything out of her nose.  She does note her left ear crackles and pops.  Cough is dry.  No wheezing.  No shortness of breath.  No fevers.  She has been using ibuprofen twice daily for this.  Rash: She notes erythematous flaky rash in her eyebrows, around her nose, and in the edge of her scalp anteriorly.  No itching.  No pain.  Diabetes: Typically 98-120s.  Taking metformin.  Some increased thirst though no increased urination.  No hypoglycemia.  Breast pain: Patient reports this resolved.  She had a negative mammogram.  Depression screening was positive.  She reports this is related to her feeling ill recently.  She denies that she feels depressed.  No SI.  Social History   Tobacco Use  Smoking Status Former Smoker  . Years: 33.00  . Last attempt to quit: 07/10/2013  . Years since quitting: 5.2  Smokeless Tobacco Never Used     ROS see history of present illness  Objective  Physical Exam Vitals:   10/01/18 1049  BP: 105/68  Pulse: 89  Temp: 98.2 F (36.8 C)  SpO2: 95%    BP Readings from Last 3 Encounters:  10/01/18 105/68  09/20/18 (!) 142/80  09/17/18 122/80   Wt Readings from Last 3 Encounters:  10/01/18 256 lb 6.4 oz (116.3 kg)  09/20/18 254 lb 9.6 oz (115.5 kg)  09/17/18 255 lb 6.4 oz (115.8 kg)    Physical Exam Constitutional:      General: She is not in acute distress.    Appearance: She is not diaphoretic.  HENT:     Head:     Comments: Mild erythematous rash over left eyebrow and in the edge of her scalp on the right, no significant flaking noted    Ears:     Comments: Bilateral TMs with small amount of clear fluid behind them, no  purulent fluid or erythema    Mouth/Throat:     Mouth: Mucous membranes are moist.     Pharynx: Oropharynx is clear.  Eyes:     Conjunctiva/sclera: Conjunctivae normal.     Pupils: Pupils are equal, round, and reactive to light.  Neck:     Musculoskeletal: Neck supple.  Cardiovascular:     Rate and Rhythm: Normal rate and regular rhythm.     Heart sounds: Normal heart sounds.  Pulmonary:     Effort: Pulmonary effort is normal.     Breath sounds: Normal breath sounds.  Lymphadenopathy:     Cervical: No cervical adenopathy.  Skin:    General: Skin is warm and dry.  Neurological:     Mental Status: She is alert.      Assessment/Plan: Please see individual problem list.  Sinusitis Symptoms consistent with sinusitis.  She has not responded to 2 courses of antibiotics.  She has an Augmentin allergy.  Initially prescribed a cephalosporin though this was unaffordable.  Levaquin was sent to the patient's pharmacy given lack of other options.  If not improving she will let us know.  DM type 2 (diabetes mellitus, type 2) (HCC) Seems to be adequately controlled.  Continue current medication.  Check A1c.  Breast pain Resolved.  Monitor for recurrence.  She had a negative mammogram.  Seborrheic dermatitis Location and description of rash consistent with this.  Ketoconazole cream to be applied to her face.  Ketoconazole shampoo for her scalp.  Depression screening is positive though the patient reports the symptoms on the list are related to how she is feeling with her current illness.  We opted to monitor and if this is not improving with improvement in her current illness she would let us know and would consider starting medication.  Orders Placed This Encounter  Procedures  . HgB A1c    Meds ordered this encounter  Medications  . DISCONTD: cefpodoxime (VANTIN) 200 MG tablet    Sig: Take 1 tablet (200 mg total) by mouth 2 (two) times daily.    Dispense:  14 tablet    Refill:  0    . ketoconazole (NIZORAL) 2 % cream    Sig: Apply 1 application topically daily.    Dispense:  15 g    Refill:  0  . ketoconazole (NIZORAL) 2 % shampoo    Sig: Apply 1 application topically 2 (two) times a week.    Dispense:  120 mL    Refill:  0  . DISCONTD: cefpodoxime (VANTIN) 200 MG tablet    Sig: Take 1 tablet (200 mg total) by mouth 2 (two) times daily.    Dispense:  14 tablet    Refill:  0     [name], MD [place]

## 2018-10-01 NOTE — Telephone Encounter (Signed)
RX for Losartan sent to [facility].

## 2018-10-01 NOTE — Telephone Encounter (Signed)
Levaquin sent to pharmacy ?

## 2018-10-01 NOTE — Telephone Encounter (Signed)
Copied from [place] [id number]. Topic: Quick Communication - See Telephone Encounter >> Oct 01, 2018 12:50 PM Vernona Rieger wrote: CRM for notification. See Telephone encounter for: 10/01/18.\ Tarheel Drg called and stated that Cefpodoxime Proxetil 200 mg is $40 and she can not afford that she would like something else in place. Patient was in the office today. Please Advise.

## 2018-10-01 NOTE — Patient Instructions (Addendum)
Nice to see you. We will treat your sinusitis with Cefpodoxime.  Please let us know if this does not improve. Please use the ketoconazole cream for the rash on your face and the ketoconazole shampoo for the rash in your scalp.  If this is not improving please let us know.

## 2018-10-02 DIAGNOSIS — L219 Seborrheic dermatitis, unspecified: Secondary | ICD-10-CM | POA: Insufficient documentation

## 2018-10-02 NOTE — Assessment & Plan Note (Signed)
Symptoms consistent with sinusitis.  She has not responded to 2 courses of antibiotics.  She has an Augmentin allergy.  Initially prescribed a cephalosporin though this was unaffordable.  Levaquin was sent to the patient's pharmacy given lack of other options.  If not improving she will let us know.

## 2018-10-02 NOTE — Assessment & Plan Note (Addendum)
Seems to be adequately controlled.  Continue current medication.  Check A1c. 

## 2018-10-02 NOTE — Assessment & Plan Note (Signed)
Location and description of rash consistent with this.  Ketoconazole cream to be applied to her face.  Ketoconazole shampoo for her scalp.

## 2018-10-02 NOTE — Assessment & Plan Note (Signed)
Resolved.  Monitor for recurrence.  She had a negative mammogram.

## 2018-10-04 ENCOUNTER — Other Ambulatory Visit: Payer: Self-pay | Admitting: Pharmacy Technician

## 2018-10-04 NOTE — Patient Outreach (Signed)
[facility]) Care Management  10/04/2018  Martha Woodard 08/03/1966 [id number]   Care coordination call placed to [employer] patient assistance for Proventil and BI patient assistance for Sprivia.  Spoke to [place] at [employer] who said the application is not showing up in their system yet. She informed they were closed on Dec 24-25 and Dec 31-Jan1. She informed it can take up to 10-14 business days for them to receive it in the mail with an additional 3-5 days to process. She apologized for the delay but informed that the process is normally slower in January than other times due to them being closed.  Spoke to [place] at [facility] who informed that patient was APPROVED for Spiriva from 09/29/2018-09/29/2019. She informed that her next refill would be due on 12/05/2018 and the earliest the patient could call in for that refill would be 11/22/2018.  Will followup with both patient and [employer] in 5-7 business days to inform patient and to inquire if application has been received with [employer].  Glema Takaki P. Johncharles Fusselman, [place] Management [phone]

## 2018-10-11 ENCOUNTER — Other Ambulatory Visit: Payer: Self-pay

## 2018-10-11 ENCOUNTER — Telehealth: Payer: Self-pay | Admitting: Family Medicine

## 2018-10-11 ENCOUNTER — Telehealth: Payer: Self-pay | Admitting: *Deleted

## 2018-10-11 ENCOUNTER — Other Ambulatory Visit: Payer: Self-pay | Admitting: Pharmacy Technician

## 2018-10-11 DIAGNOSIS — I491 Atrial premature depolarization: Secondary | ICD-10-CM

## 2018-10-11 MED ORDER — METOPROLOL SUCCINATE ER 25 MG PO TB24: 25.0000 mg | ORAL_TABLET | Freq: Every day | ORAL | 2 refills | Status: DC

## 2018-10-11 NOTE — Telephone Encounter (Signed)
Received [employer] health alert for medication refill on Metoprolol, patient stated she has enough to wait until medication can arrive from [place] medication refilled.

## 2018-10-11 NOTE — Patient Outreach (Signed)
[facility]) Care Management  10/11/2018  Martha Woodard 03-14-1966 [id number]    Care corodiantion call placed to [employer] patient assistance in regards to patient's application for [facility] & Proventil HFA.  Spoke to [place] who said they have yet to receive the application for [phone]. [name] stated they are delayed in processing and it may take a longer time.   Will followup with [employer] in 7-10 business days to see if application has been received.  [name] P. Taylinn Brabant, [place] Management [phone]

## 2018-10-11 NOTE — Patient Outreach (Signed)
[facility]) Care Management  10/11/2018  Martha Woodard December 03, 1965 [id number]  Return call from the patient, HIPAA identifiers confirmed. BSW introduced self to the patient and the reason for today's call, indicating this BSW had been contacted by [facility] [place] regarding the [place] program. The patient confirmed she requested a Education officer, museum contact her to discuss this program. BSW informed the patient this program is part of DSS and would require she apply in person. The patient reports she visited [place] on January 2nd and was told her income is too high. The patient reports feelings of frustration and wondering if she spoke with an actual DSS worker as she considers her income to be low.   The patient reports she is single and the only source of income is through disability. The patient feels there is "no way" her income is over the limit and considering the DSS worker she spoke with did not have a computer on their desk, they may have been a volunteer without complete knowledge of the program. The patient is questioning if she was informed of the right information. BSW accessed the [facility] web-site to review the [place] program requirements. The patient reports a monthly income under the guidelines but a yearly income over the guidelines. BSW inquired what the patients monthly income is prior to Medicare deductions. It is then reported the patients overall income to be $88.00 over the eligible income limit. BSW explained that although this paymet is deducted prior to receiving her disability payment, the total income prior to deductions is what is considered for DSS programs. The patient stated understanding.  BSW provided the patient with contact numbers to [place]. BSW explained that both programs will assist with utility costs if funding is available. BSW encouraged the patient to outreach these resources daily, if needed, as it is sometimes hard to  establish contact due to high volume of calls. The patient stated understanding.  BSW to sign off on patient's case as available resources have been provided.  Daneen Schick, BSW, CDP [facility] [phone]

## 2018-10-11 NOTE — Telephone Encounter (Signed)
Copied from [place] [id number]. Topic: Quick Communication - See Telephone Encounter >> Oct 11, 2018 11:59 AM Conception Chancy, NT wrote: CRM for notification. See Telephone encounter for: 10/11/18.  Patient is calling and states that she is not due for her medications through [facility] but they told her that they are needing approval for all of the medications that Dr. Caryl Bis prescribes for her. I informed her that Metoprolol was called in today. Please advise.

## 2018-10-11 NOTE — Patient Outreach (Signed)
[place] [facility]) Care Management  10/11/2018  CYNDIA DEGRAFF 09-Jul-1966 [id number]  BSW received an in-basket message from [place] requesting this BSW contact the patient in regards to the [place] program. BSW placed an unsuccessful call to the patient and left a HIPAA compliant voice message requesting a return call. BSW will attempt to outreach the patient within the next four business days if no return call is received.  Daneen Schick, BSW, CDP [facility] [phone]

## 2018-10-11 NOTE — Patient Outreach (Signed)
[place] [facility]) Care Management  10/11/2018  Martha Woodard [birth year] [id number]  ADDENDUM  Unsuccessful outreach call placed to patient in regards to her BI application for Sprivia and her [employer] applications for [employer] and Proventil HFA.  Called to update patient on the status of her applications but unfortunately patient did not answer the phone.  Will followup with [employer] and with patient in 7-10 business days if call is not returned.  Kadence Mimbs P. Bethenny Losee, [place] Management [phone]

## 2018-10-11 NOTE — Patient Outreach (Signed)
[place] [facility]) Care Management  10/11/2018  POET HINEMAN 1966/01/04 [id number]    ADDENDUM  Incoming call received from patient in regards to [facility] patient assistance applications for Dulera, Proventil and Spiriva respectively.  Spoke to Ms. Drewry, HIPPA identifiers verified. Informed Ms. Crum that her Merck application was still in process but that she had been approved for BI for all of 2020. Confirmed with patient that she had the number to call for her Spiriva refill for BI as well as my number should questions arise. Informed patient that her refill for Spiriva was due on 3/8 but that she could phone in for her refill as of 11/22/2018. Patient verbalized understanding.  Patient inquired if I could put her in touch with someone that could help her with the Energy Assistance program for disabled people. She thought it was called the LEAP program. Informed patient that I would outreach a [facility] social worker to followup with her. Patient was agreeable to speak to Education officer, museum.  Will route note to [place] for followup with [place] question.  Will followup with patient in 14-21 business days in regards to [place] application.  Jansel Vonstein P. Ether Wolters, [place] Management [phone]

## 2018-10-12 ENCOUNTER — Other Ambulatory Visit: Payer: Self-pay

## 2018-10-12 MED ORDER — GLUCOSE BLOOD VI STRP: ORAL_STRIP | 4 refills | Status: DC

## 2018-10-12 MED ORDER — ROSUVASTATIN CALCIUM 20 MG PO TABS: 20.0000 mg | ORAL_TABLET | Freq: Every day | ORAL | 3 refills | Status: DC

## 2018-10-12 MED ORDER — FLUTICASONE PROPIONATE 50 MCG/ACT NA SUSP: NASAL | 12 refills | Status: DC

## 2018-10-12 MED ORDER — METFORMIN HCL 500 MG PO TABS: ORAL_TABLET | ORAL | 1 refills | Status: DC

## 2018-10-12 NOTE — Telephone Encounter (Signed)
Called and spoke with patient. Pt needed the metoprolol refilled. This was refilled yesterday. Pt advised and voiced understanding.

## 2018-10-13 DIAGNOSIS — E119 Type 2 diabetes mellitus without complications: Secondary | ICD-10-CM | POA: Diagnosis not present

## 2018-10-14 ENCOUNTER — Ambulatory Visit: Payer: Medicare Other

## 2018-10-19 ENCOUNTER — Other Ambulatory Visit: Payer: Self-pay | Admitting: Pharmacy Technician

## 2018-10-19 NOTE — Patient Outreach (Signed)
[place] [facility]) Care Management  10/19/2018  Martha Woodard March 22, 1966 [id number]  Care coordination call placed to [employer] patient assistance in regards to Proventil HFA and [name] patient assistance.  Spoke to Junior who said the application had been received on 10/15/2018 and the attestation letter was mailed on 10/16/2018. Junior informed that it could take between 7-14 business days to arrive at the patient's home. Once they receive the attestation letter back then they can continue to process the application.  Will followup with patient in 10-14 business days to inquire if she has received the attestation  letter.  Klayton Monie P. Alaysia Lightle, [place] Management [phone]

## 2018-10-26 ENCOUNTER — Other Ambulatory Visit: Payer: Self-pay | Admitting: Family Medicine

## 2018-10-26 MED ORDER — HYDROCHLOROTHIAZIDE 12.5 MG PO CAPS: 12.5000 mg | ORAL_CAPSULE | Freq: Every day | ORAL | 2 refills | Status: DC

## 2018-10-26 NOTE — Telephone Encounter (Signed)
Requested Prescriptions  Pending Prescriptions Disp Refills  . hydrochlorothiazide (MICROZIDE) 12.5 MG capsule 90 capsule 2    Sig: Take 1 capsule (12.5 mg total) by mouth daily.     Cardiovascular: Diuretics - Thiazide Passed - 10/26/2018 11:31 AM      Passed - Ca in normal range and within 360 days    Calcium  Date Value Ref Range Status  06/16/2018 9.6 8.4 - 10.5 mg/dL Final         Passed - Cr in normal range and within 360 days    Creatinine, Ser  Date Value Ref Range Status  06/16/2018 0.61 0.40 - 1.20 mg/dL Final         Passed - K in normal range and within 360 days    Potassium  Date Value Ref Range Status  06/16/2018 3.6 3.5 - 5.1 mEq/L Final         Passed - Na in normal range and within 360 days    Sodium  Date Value Ref Range Status  06/16/2018 139 135 - 145 mEq/L Final         Passed - Last BP in normal range    BP Readings from Last 1 Encounters:  10/01/18 105/68         Passed - Valid encounter within last 6 months    Recent Outpatient Visits          3 weeks ago Type 2 diabetes mellitus with other circulatory complication, without long-term current use of insulin ([place])   [place], Angela Adam, MD   1 month ago Acute sinusitis, recurrence not specified, unspecified location   Center, FNP   1 month ago Acute sinusitis, recurrence not specified, unspecified location   [facility] [place], Jacquelynn Cree, FNP   4 months ago Essential hypertension   [place], Angela Adam, MD   7 months ago Acute non-recurrent maxillary sinusitis   [facility] [place] Raven[place], Angela Adam, MD      Future Appointments            In 2 months Caryl Bis, Angela Adam, MD [facility], [facility]

## 2018-10-26 NOTE — Telephone Encounter (Signed)
Copied from [place] [phone] [id number]. Topic: Quick Communication - See Telephone Encounter >> Oct 26, 2018 10:57 AM [place] Chancy, NT wrote: CRM for notification. See Telephone encounter for: 10/26/18.  Patient is calling and requesting a refill on hydrochlorothiazide (MICROZIDE) 12.5 MG capsule.  [place], [place] [facility] [place] [address] [place] [address] Phone: [phone] Fax: [phone]

## 2018-10-27 ENCOUNTER — Other Ambulatory Visit: Payer: Self-pay | Admitting: Pharmacy Technician

## 2018-10-27 NOTE — Patient Outreach (Signed)
[place] [facility]) Care Management  10/27/2018  Martha Woodard 07/25/1966 [id number]   Successful outreach call placed to patient in regards to Merck application for [facility] and Proventil HFA.  Spoke to patient, HIPAA identifiers verified. Inquired if patient had received the attestation letter from [employer]. Patient informed me that she had received the letter, she answered the questions and placed it in the mail yesterday. Inquired if patient had any difficulties with understanding the letter and patient stated she did not. Inquired if patient had placed all documents that they had sent her along with the attestation  letter into the envelope provided and patient stated she had. Informed patient that I would followup with [employer] within the next 7-14 business days to inquire on status of application. Patient verbalized understanding.  Will followup with [employer] in 10-14 business days to confirm receipt of attestation letter and to inquire on status of application.  Laylah Riga P. Mirissa Lopresti, [place] Management [phone]

## 2018-11-05 ENCOUNTER — Other Ambulatory Visit: Payer: Self-pay | Admitting: Pharmacy Technician

## 2018-11-05 NOTE — Patient Outreach (Signed)
[facility]) Care Management  11/05/2018  Martha Woodard Oct 08, 1965 [id number]   Care coordination call placed to [employer] patient assistance in regards to patient's application for [facility] and Proventil HFA.  Spoke to [place] who said patient had been APPROVED 11/01/2018-09/29/2019. Per Irma patient will be receiving 1 of each inhaler. Irma informed that it usually takes 2-3 business days for the pharmacy to received the request, with another 7-10 days for the pharmacy to process the request followed by another 3-5 business days for it to arrive at the patient's home.  Will followup with patient in 10-14 business days to confirm receipt of medication.  Katrinia Straker P. Chaquita Basques, [place] Management [phone]

## 2018-11-16 ENCOUNTER — Other Ambulatory Visit: Payer: Self-pay

## 2018-11-16 MED ORDER — POTASSIUM CHLORIDE ER 10 MEQ PO TBCR: 10.0000 meq | EXTENDED_RELEASE_TABLET | Freq: Two times a day (BID) | ORAL | 3 refills | Status: DC

## 2018-11-19 ENCOUNTER — Other Ambulatory Visit: Payer: Self-pay | Admitting: Pharmacy Technician

## 2018-11-19 NOTE — Patient Outreach (Signed)
[facility]) Care Management  11/19/2018  Martha Woodard [birth year] [id number]    Successful outreach call placed to patient in regards to [employer] patient assistance for [facility] and Proventil.  Spoke to patient and informed her that hse had been approved fort he program. Patient stated she has not received any medication from them yet but that she was not out of medication due to having refill it at the end of last year. Informed patient that my last correspondence with [employer] said the medication should be delivered by the end of the month.  Will followup with patient in 5-10 business days to inquire if she has received the medication.  Martha Woodard, [place] Management [phone]

## 2018-11-22 ENCOUNTER — Other Ambulatory Visit: Payer: Self-pay | Admitting: Pharmacy Technician

## 2018-11-22 NOTE — Patient Outreach (Signed)
[facility]) Care Management  11/22/2018  SHARENA DIBENEDETTO 04/25/1966 [id number]    ADDENDUM  Successful outreach call placed to patient in regards to the [facility] and Proventil application with [employer] patient assistance.  Spoke to patient, HIPAA identifiers verified. Informed patient that Adonis Huguenin from [employer] had informed me that 1 inhaler of Dulera and 1 inhaler of Proventil was delivered on 11/12/2018 to the patient's mailbox.  Patient said she had some mail that she had not gone through but she didn't think it was her inhalers as she was expecting 3 of each. Informed patient that her provider had written the rx of 1 inhaler of each. Upon inspection of her mail the patient found the 2 inhalers, 1 Dulera and 1 Proventil.  Inquired if patient knew how to order the refills from [place], [facility] and patient verbalized all I do is call this number on the package and give them the rx number to refill. Informed patient that was the correct process.   Also inquired if patient knew how to order her Sprivia refill from [facility] and again she stated the same information as she did with [employer] using [employer] phone number.Informed patietn she was due for a refill on March 8.  Informed patient that if at any time they said a new rx was needed to make sure when she contacted the provider's office to relay that she gets the inhalers from the 2 companies. Patient verbalized understanding.  Inquired if patient had any other questions or concerns and she said no. Patient verbalized having my phone number for future reference.   Will remove myself from care team and will route this note to [place] to close pharmacy assistance case as it has been completed.  Tylynn Braniff P. Amori Colomb, [place] Management [phone]

## 2018-11-22 NOTE — Patient Outreach (Signed)
[facility]) Care Management  11/22/2018  Martha Woodard 06-Jan-1966 [id number]  Care coordination call placed to [employer] patient assistance in regards to patient's application for [facility] and Proventil.  Spoke to Martha Woodard to inquire about the delivery of the medication as the application was approved on 11/01/2018. Martha Woodard informed that 1 inhaler of Dulera and 1 inhaler of Proventil was shipped to patient and delivered on 11/12/2018 to the mailbox.  Will call patient back today as patient stated last week that she had not received the medicaiton.  Bayani Renteria P. Meya Clutter, [place] Management [phone]

## 2018-11-29 DIAGNOSIS — J449 Chronic obstructive pulmonary disease, unspecified: Secondary | ICD-10-CM | POA: Diagnosis not present

## 2018-12-03 ENCOUNTER — Emergency Department: Payer: Medicare Other

## 2018-12-03 ENCOUNTER — Encounter: Payer: Self-pay | Admitting: Emergency Medicine

## 2018-12-03 ENCOUNTER — Emergency Department
Admission: EM | Admit: 2018-12-03 | Discharge: 2018-12-03 | Disposition: A | Discharge status: Home / Self Care | Payer: Medicare Other | Attending: Emergency Medicine | Admitting: Emergency Medicine

## 2018-12-03 ENCOUNTER — Other Ambulatory Visit: Payer: Self-pay

## 2018-12-03 DIAGNOSIS — E119 Type 2 diabetes mellitus without complications: Secondary | ICD-10-CM | POA: Diagnosis not present

## 2018-12-03 DIAGNOSIS — I5032 Chronic diastolic (congestive) heart failure: Secondary | ICD-10-CM | POA: Insufficient documentation

## 2018-12-03 DIAGNOSIS — I110 Hypertensive heart disease with heart failure: Secondary | ICD-10-CM | POA: Insufficient documentation

## 2018-12-03 DIAGNOSIS — Z87891 Personal history of nicotine dependence: Secondary | ICD-10-CM | POA: Insufficient documentation

## 2018-12-03 DIAGNOSIS — J449 Chronic obstructive pulmonary disease, unspecified: Secondary | ICD-10-CM | POA: Insufficient documentation

## 2018-12-03 DIAGNOSIS — J111 Influenza due to unidentified influenza virus with other respiratory manifestations: Secondary | ICD-10-CM

## 2018-12-03 DIAGNOSIS — R05 Cough: Secondary | ICD-10-CM | POA: Diagnosis not present

## 2018-12-03 DIAGNOSIS — R110 Nausea: Secondary | ICD-10-CM | POA: Diagnosis present

## 2018-12-03 DIAGNOSIS — R69 Illness, unspecified: Secondary | ICD-10-CM

## 2018-12-03 LAB — CBC
HCT: 44.1 % (ref 36.0–46.0)
Hemoglobin: 14.2 g/dL (ref 12.0–15.0)
MCH: 27 pg (ref 26.0–34.0)
MCHC: 32.2 g/dL (ref 30.0–36.0)
MCV: 83.8 fL (ref 80.0–100.0)
NRBC: 0 % (ref 0.0–0.2)
Platelets: 144 10*3/uL — ABNORMAL LOW (ref 150–400)
RBC: 5.26 MIL/uL — ABNORMAL HIGH (ref 3.87–5.11)
RDW: 13.9 % (ref 11.5–15.5)
WBC: 4.6 10*3/uL (ref 4.0–10.5)

## 2018-12-03 LAB — COMPREHENSIVE METABOLIC PANEL
ALT: 40 U/L (ref 0–44)
AST: 46 U/L — ABNORMAL HIGH (ref 15–41)
Albumin: 4 g/dL (ref 3.5–5.0)
Alkaline Phosphatase: 44 U/L (ref 38–126)
Anion gap: 8 (ref 5–15)
BUN: 13 mg/dL (ref 6–20)
CO2: 31 mmol/L (ref 22–32)
Calcium: 8.9 mg/dL (ref 8.9–10.3)
Chloride: 95 mmol/L — ABNORMAL LOW (ref 98–111)
Creatinine, Ser: 0.57 mg/dL (ref 0.44–1.00)
GFR calc Af Amer: >60 mL/min (ref >60)
Glucose, Bld: 119 mg/dL — ABNORMAL HIGH (ref 70–99)
Potassium: 3.2 mmol/L — ABNORMAL LOW (ref 3.5–5.1)
Sodium: 134 mmol/L — ABNORMAL LOW (ref 135–145)
Total Bilirubin: 0.6 mg/dL (ref 0.3–1.2)
Total Protein: 7.5 g/dL (ref 6.5–8.1)

## 2018-12-03 LAB — INFLUENZA PANEL BY PCR (TYPE A & B)
INFLBPCR: NEGATIVE
Influenza A By PCR: NEGATIVE

## 2018-12-03 LAB — LACTIC ACID, PLASMA: Lactic Acid, Venous: 1.2 mmol/L (ref 0.5–1.9)

## 2018-12-03 LAB — LIPASE, BLOOD: LIPASE: 28 U/L (ref 11–51)

## 2018-12-03 MED ORDER — IPRATROPIUM-ALBUTEROL 0.5-2.5 (3) MG/3ML IN SOLN
3.0000 mL | Freq: Once | RESPIRATORY_TRACT | Status: AC
  Administered 2018-12-03: 3 mL via RESPIRATORY_TRACT
  Filled 2018-12-03: qty 3

## 2018-12-03 MED ORDER — HYDROCOD POLST-CPM POLST ER 10-8 MG/5ML PO SUER: 5.0000 mL | Freq: Two times a day (BID) | ORAL | 0 refills | Status: DC

## 2018-12-03 MED ORDER — ONDANSETRON HCL 4 MG/2ML IJ SOLN
4.0000 mg | Freq: Once | INTRAMUSCULAR | Status: DC
  Filled 2018-12-03: qty 2

## 2018-12-03 MED ORDER — ONDANSETRON 4 MG PO TBDP: 4.0000 mg | ORAL_TABLET | Freq: Three times a day (TID) | ORAL | 0 refills | Status: DC | PRN

## 2018-12-03 MED ORDER — ACETAMINOPHEN 325 MG PO TABS
650.0000 mg | ORAL_TABLET | Freq: Once | ORAL | Status: AC | PRN
  Administered 2018-12-03: 650 mg via ORAL
  Filled 2018-12-03: qty 2

## 2018-12-03 MED ORDER — KETOROLAC TROMETHAMINE 30 MG/ML IJ SOLN
30.0000 mg | Freq: Once | INTRAMUSCULAR | Status: AC
  Administered 2018-12-03: 30 mg via INTRAVENOUS
  Filled 2018-12-03: qty 1

## 2018-12-03 MED ORDER — SODIUM CHLORIDE 0.9% FLUSH
3.0000 mL | Freq: Once | INTRAVENOUS | Status: AC
  Administered 2018-12-03: 3 mL via INTRAVENOUS

## 2018-12-03 MED ORDER — ONDANSETRON HCL 4 MG/2ML IJ SOLN
4.0000 mg | Freq: Once | INTRAMUSCULAR | Status: AC
  Administered 2018-12-03: 4 mg via INTRAVENOUS

## 2018-12-03 MED ORDER — ONDANSETRON HCL 4 MG/2ML IJ SOLN
4.0000 mg | Freq: Once | INTRAMUSCULAR | Status: AC | PRN
  Administered 2018-12-03: 4 mg via INTRAVENOUS
  Filled 2018-12-03: qty 2

## 2018-12-03 MED ORDER — SODIUM CHLORIDE 0.9 % IV SOLN
Freq: Once | INTRAVENOUS | Status: AC
  Administered 2018-12-03: 16:00:00 via INTRAVENOUS

## 2018-12-03 NOTE — ED Provider Notes (Signed)
[facility] Emergency Department Provider Note       Time seen: ----------------------------------------- 4:03 PM on 12/03/2018 -----------------------------------------   I have reviewed the triage vital signs and the nursing notes.  HISTORY   Chief Complaint Nausea    HPI Martha Woodard is a 53 y.o. female with a history of CHF, COPD, diabetes, hyperlipidemia, hypertension who presents to the ED for flulike symptoms.  Patient denies fevers, chills, nausea and fatigue.  She has had a cough as well.  Symptoms have been going on for the past 3 days.  She is on chronic oxygen.  Past Medical History:  Diagnosis Date  . CHF (congestive heart failure) ([place])   . Colon polyp 01-01-2016  . COPD (chronic obstructive pulmonary disease) ([place])   . Diabetes mellitus without complication ([place])   . Diastolic heart failure ([place])   . HLD (hyperlipidemia)   . Hypertension   . Morbid obesity with BMI of 45.0-49.9, adult ([place])   . Multiple lung nodules on CT   . Shortness of breath dyspnea     Patient Active Problem List   Diagnosis Date Noted  . Seborrheic dermatitis 10/02/2018  . Light headedness 06/17/2018  . Foot pain 06/17/2018  . Chest pain 06/16/2018  . Pain in shin 06/16/2018  . Sinusitis 03/01/2018  . Bruise 02/23/2018  . Breast pain 02/09/2018  . Allergic rhinitis 11/09/2017  . Venous insufficiency 08/07/2017  . Chronic respiratory failure ([place]) 11/13/2016  . Skin cyst 08/19/2016  . Thyroid nodule 11/28/2015  . COPD (chronic obstructive pulmonary disease) with emphysema ([place]) 11/13/2015  . Chronic diastolic heart failure ([place]) 11/13/2015  . COPD exacerbation ([place]) 10/01/2015  . Morbid obesity with BMI of 45.0-49.9, adult ([place]) 10/01/2015  . Obesity hypoventilation syndrome ([place]) 10/01/2015  . Eczema 10/01/2015  . Essential hypertension 10/01/2015  . DM type 2 (diabetes mellitus, type 2) ([place]) 06/26/2014  . Hyperlipidemia 06/26/2014  . Lung nodule  seen on imaging study 02/06/2014    Past Surgical History:  Procedure Laterality Date  . BIOPSY THYROID Right 02-06-14   NEGATIVE FOR MALIGNANT CELLS proteinaceous material and macrophages.  Marland Kitchen BREAST BIOPSY Left    neg- core  . COLONOSCOPY WITH PROPOFOL N/A 01/01/2016   Procedure: COLONOSCOPY WITH PROPOFOL;  Surgeon: Lucilla Lame, MD;  Location: [facility] ENDOSCOPY;  Service: Endoscopy;  Laterality: N/A;  . thryoid fna Right April 2015   Proteinaceous material and macrophages.  . WISDOM TOOTH EXTRACTION      Allergies Augmentin [amoxicillin-pot clavulanate]  Social History Social History   Tobacco Use  . Smoking status: Former Smoker    Years: 33.00    Last attempt to quit: 07/10/2013    Years since quitting: 5.4  . Smokeless tobacco: Never Used  Substance Use Topics  . Alcohol use: No    Alcohol/week: 0.0 standard drinks  . Drug use: No   Review of Systems Constitutional: Positive for fevers, chills, body aches Cardiovascular: Negative for chest pain. Respiratory: Positive for shortness of breath and cough Gastrointestinal: Negative for abdominal pain, vomiting and diarrhea. Musculoskeletal: Negative for back pain. Skin: Negative for rash. Neurological: Negative for headaches, focal weakness or numbness.  All systems negative/normal/unremarkable except as stated in the HPI  ____________________________________________   PHYSICAL EXAM:  VITAL SIGNS: ED Triage Vitals  Enc Vitals Group     BP 12/03/18 1455 140/70     Pulse Rate 12/03/18 1455 (!) 106     Resp 12/03/18 1454 16     Temp 12/03/18 1455 (!)  103 F (39.4 C)     Temp Source 12/03/18 1454 Oral     SpO2 12/03/18 1455 91 %     Weight 12/03/18 1454 256 lb (116.1 kg)     Height 12/03/18 1454 5\' 6"  (1.676 m)     Head Circumference --      Peak Flow --      Pain Score 12/03/18 1454 7     Pain Loc --      Pain Edu? --      Excl. in [address]? --    Constitutional: Alert and oriented. Well appearing and in no  distress. Eyes: Conjunctivae are normal. Normal extraocular movements. ENT      Head: Normocephalic and atraumatic.      Nose: No congestion/rhinnorhea.      Mouth/Throat: Mucous membranes are moist.      Neck: No stridor. Cardiovascular: Normal rate, regular rhythm. No murmurs, rubs, or gallops. Respiratory: Normal respiratory effort without tachypnea nor retractions. Breath sounds are clear and equal bilaterally. No wheezes/rales/rhonchi. Gastrointestinal: Soft and nontender. Normal bowel sounds Musculoskeletal: Nontender with normal range of motion in extremities. No lower extremity tenderness nor edema. Neurologic:  Normal speech and language. No gross focal neurologic deficits are appreciated.  Skin:  Skin is warm, dry and intact. No rash noted. Psychiatric: Mood and affect are normal. Speech and behavior are normal.  ____________________________________________  ED COURSE:  As part of my medical decision making, I reviewed the following data within the [place] History obtained from family if available, nursing notes, old chart and ekg, as well as notes from prior ED visits. Patient presented for flulike symptoms, we will assess with labs and imaging as indicated at this time.   Procedures ____________________________________________   LABS (pertinent positives/negatives)  Labs Reviewed  COMPREHENSIVE METABOLIC PANEL - Abnormal; Notable for the following components:      Result Value   Sodium 134 (*)    Potassium 3.2 (*)    Chloride 95 (*)    Glucose, Bld 119 (*)    AST 46 (*)    All other components within normal limits  CBC - Abnormal; Notable for the following components:   RBC 5.26 (*)    Platelets 144 (*)    All other components within normal limits  LIPASE, BLOOD  LACTIC ACID, PLASMA  INFLUENZA PANEL BY PCR (TYPE A & B)  URINALYSIS, COMPLETE (UACMP) WITH MICROSCOPIC  LACTIC ACID, PLASMA    RADIOLOGY  Chest x-ray  Did not reveal any acute  process ____________________________________________   DIFFERENTIAL DIAGNOSIS   Influenza, COPD, CHF, pneumonia, dehydration  FINAL ASSESSMENT AND PLAN  Influenza-like illness   Plan: The patient had presented for flulike symptoms. Patient's labs were overall reassuring. Patient's imaging not reveal any acute process.  She was given some fluids and anti-medics here.  I will prescribe symptomatic treatment for her at home and she is cleared for outpatient follow-up with her doctor.     Laurence Aly, MD    Note: This note was generated in part or whole with voice recognition software. Voice recognition is usually quite accurate but there are transcription errors that can and very often do occur. I apologize for any typographical errors that were not detected and corrected.     Earleen Newport, MD 12/03/18 [phone]

## 2018-12-03 NOTE — ED Triage Notes (Signed)
Pt to ED via POV c/o fever, chills, nausea, and fatigue. Pt states that she has had symptoms x 3 days. Pt is on chronic O2. Pt is in NAD at this time.

## 2018-12-07 ENCOUNTER — Encounter: Payer: Self-pay | Admitting: Family Medicine

## 2018-12-07 ENCOUNTER — Ambulatory Visit (INDEPENDENT_AMBULATORY_CARE_PROVIDER_SITE_OTHER): Payer: Medicare Other | Admitting: Family Medicine

## 2018-12-07 VITALS — BP 124/72 | HR 88 | Temp 98.5°F | Ht 66.0 in | Wt 253.6 lb

## 2018-12-07 DIAGNOSIS — D1030 Benign neoplasm of unspecified part of mouth: Secondary | ICD-10-CM | POA: Diagnosis not present

## 2018-12-07 DIAGNOSIS — J0100 Acute maxillary sinusitis, unspecified: Secondary | ICD-10-CM | POA: Diagnosis not present

## 2018-12-07 DIAGNOSIS — R509 Fever, unspecified: Secondary | ICD-10-CM

## 2018-12-07 LAB — POCT URINALYSIS DIPSTICK
Bilirubin, UA: NEGATIVE
Glucose, UA: NEGATIVE
Ketones, UA: NEGATIVE
Leukocytes, UA: NEGATIVE
NITRITE UA: NEGATIVE
PH UA: 7 (ref 5.0–8.0)
Protein, UA: NEGATIVE
RBC UA: NEGATIVE
Spec Grav, UA: 1.01 (ref 1.010–1.025)
Urobilinogen, UA: 0.2 E.U./dL

## 2018-12-07 LAB — COMPREHENSIVE METABOLIC PANEL
ALT: 49 U/L — ABNORMAL HIGH (ref 0–35)
AST: 40 U/L — ABNORMAL HIGH (ref 0–37)
Albumin: 3.9 g/dL (ref 3.5–5.2)
Alkaline Phosphatase: 58 U/L (ref 39–117)
BUN: 7 mg/dL (ref 6–23)
CO2: 33 meq/L — AB (ref 19–32)
Calcium: 9.5 mg/dL (ref 8.4–10.5)
Chloride: 97 mEq/L (ref 96–112)
Creatinine, Ser: 0.54 mg/dL (ref 0.40–1.20)
GFR: 117.54 mL/min (ref >60.00)
Glucose, Bld: 90 mg/dL (ref 70–99)
Potassium: 4 mEq/L (ref 3.5–5.1)
SODIUM: 138 meq/L (ref 135–145)
Total Bilirubin: 0.4 mg/dL (ref 0.2–1.2)
Total Protein: 6.7 g/dL (ref 6.0–8.3)

## 2018-12-07 LAB — CBC WITH DIFFERENTIAL/PLATELET
BASOS ABS: 0 10*3/uL (ref 0.0–0.1)
Basophils Relative: 0.4 % (ref 0.0–3.0)
Eosinophils Absolute: 0.1 10*3/uL (ref 0.0–0.7)
Eosinophils Relative: 1.1 % (ref 0.0–5.0)
HCT: 37 % (ref 36.0–46.0)
Hemoglobin: 12.3 g/dL (ref 12.0–15.0)
Lymphocytes Relative: 32.8 % (ref 12.0–46.0)
Lymphs Abs: 1.6 10*3/uL (ref 0.7–4.0)
MCHC: 33.4 g/dL (ref 30.0–36.0)
MCV: 82.1 fl (ref 78.0–100.0)
Monocytes Absolute: 0.5 10*3/uL (ref 0.1–1.0)
Monocytes Relative: 9.9 % (ref 3.0–12.0)
Neutro Abs: 2.7 10*3/uL (ref 1.4–7.7)
Neutrophils Relative %: 55.8 % (ref 43.0–77.0)
PLATELETS: 158 10*3/uL (ref 150.0–400.0)
RBC: 4.51 Mil/uL (ref 3.87–5.11)
RDW: 14.5 % (ref 11.5–15.5)
WBC: 4.9 10*3/uL (ref 4.0–10.5)

## 2018-12-07 MED ORDER — DOXYCYCLINE HYCLATE 100 MG PO TABS: 100.0000 mg | ORAL_TABLET | Freq: Two times a day (BID) | ORAL | 0 refills | Status: DC

## 2018-12-07 NOTE — Patient Instructions (Signed)
Nice to see you. We will treat you with doxycycline to cover for possible bronchitis versus sinusitis. We will check lab work as well. We will get you to see ENT. If you develop chest pain, shortness of breath, cough productive of blood, recurrent fevers, abdominal pain, blood in your stool, or any new or worsening symptoms please seek medical attention immediately.

## 2018-12-07 NOTE — Progress Notes (Signed)
[name], MD Phone: [phone]  Martha Woodard is a 53 y.o. female who presents today for same day visit.  Fever/cough: Patient notes this started on March 3.  She notes no travel.  No COVID19 exposure.  No sick contacts.  No flu exposure.  She went to the ED on March 6 and was diagnosed with a flulike illness.  Her flu test was negative.  Her chest x-ray did not reveal pneumonia.  She has continued to have some chills and fatigue.  She has felt feverish though has not checked her temperature at home.  T-max was 36 F at the ED.  She did note some dyspnea when she went to the ED though that has improved.  She had some diarrhea yesterday though has not had any bowel movements today.  She notes some nausea though that has eased up.  No vomiting.  She notes her stomach hurts when she coughs though otherwise no abdominal pain.  She notes her urine was initially dark that has improved slightly.  She did have some dysuria initially.  She does note postnasal drip and sinus and nasal congestion though it is not overtly severe.  She is blowing bloody mucus out of her nose.  She has been drinking lots of fluids.  She does note prior to getting sick she had not left her house or yard for 2 weeks.  Papule of oral cavity: Patient has had a papule on her left inner cheek per her report for a number of years now.  She notes it has not enlarged over the last year.  She notes no prior evaluation for this.  Social History   Tobacco Use  Smoking Status Former Smoker  . Years: 33.00  . Last attempt to quit: 07/10/2013  . Years since quitting: 5.4  Smokeless Tobacco Never Used     ROS see history of present illness  Objective  Physical Exam Vitals:   12/07/18 1022  BP: 124/72  Pulse: 88  Temp: 98.5 F (36.9 C)  SpO2: 96%    BP Readings from Last 3 Encounters:  12/07/18 124/72  12/03/18 129/70  10/01/18 105/68   Wt Readings from Last 3 Encounters:  12/07/18 253 lb 9.6 oz (115 kg)    12/03/18 256 lb (116.1 kg)  10/01/18 256 lb 6.4 oz (116.3 kg)    Physical Exam Constitutional:      General: She is not in acute distress.    Appearance: She is not diaphoretic.  HENT:     Head: Normocephalic and atraumatic.     Comments: Frontal sinuses tender to percussion    Right Ear: Tympanic membrane normal.     Left Ear: Tympanic membrane normal.     Mouth/Throat:     Mouth: Mucous membranes are moist.     Pharynx: Oropharynx is clear.     Comments: Small papule noted on left inner cheek Eyes:     Conjunctiva/sclera: Conjunctivae normal.     Pupils: Pupils are equal, round, and reactive to light.  Cardiovascular:     Rate and Rhythm: Normal rate and regular rhythm.     Heart sounds: Normal heart sounds.  Pulmonary:     Effort: Pulmonary effort is normal.     Breath sounds: Normal breath sounds.  Abdominal:     General: Bowel sounds are normal. There is no distension.     Palpations: Abdomen is soft.     Tenderness: There is no abdominal tenderness. There is no guarding or rebound.  Skin:    General: Skin is warm and dry.  Neurological:     Mental Status: She is alert.      Assessment/Plan: Please see individual problem list.  Sinusitis I suspect the patient's symptoms are likely related to sinusitis.  This could be related to a COPD exacerbation as well.  We will treat her with doxycycline.  If she worsens she will be reevaluated.  Lab work ordered.  Given return precautions.  Papilloma of oral cavity Papule noted.  We will refer to ENT to consider removal.   Orders Placed This Encounter  Procedures  . CBC w/Diff  . Comp Met (CMET)  . Ambulatory referral to ENT    Referral Priority:   Routine    Referral Type:   Consultation    Referral Reason:   Specialty Services Required    Requested Specialty:   Otolaryngology    Number of Visits Requested:   1  . POCT Urinalysis Dipstick    Meds ordered this encounter  Medications  . doxycycline (VIBRA-TABS)  100 MG tablet    Sig: Take 1 tablet (100 mg total) by mouth 2 (two) times daily.    Dispense:  14 tablet    Refill:  0     [name], MD [place]

## 2018-12-13 ENCOUNTER — Other Ambulatory Visit: Payer: Self-pay | Admitting: Family Medicine

## 2018-12-13 DIAGNOSIS — D1030 Benign neoplasm of unspecified part of mouth: Secondary | ICD-10-CM | POA: Insufficient documentation

## 2018-12-13 DIAGNOSIS — R7989 Other specified abnormal findings of blood chemistry: Secondary | ICD-10-CM

## 2018-12-13 DIAGNOSIS — R945 Abnormal results of liver function studies: Principal | ICD-10-CM

## 2018-12-13 NOTE — Assessment & Plan Note (Signed)
Papule noted.  We will refer to ENT to consider removal.

## 2018-12-13 NOTE — Assessment & Plan Note (Addendum)
I suspect the patient's symptoms are likely related to sinusitis.  This could be related to a COPD exacerbation as well.  We will treat her with doxycycline.  If she worsens she will be reevaluated.  Lab work ordered.  Given return precautions.

## 2018-12-14 ENCOUNTER — Telehealth: Payer: Self-pay | Admitting: Family Medicine

## 2018-12-14 ENCOUNTER — Other Ambulatory Visit: Payer: Self-pay

## 2018-12-14 ENCOUNTER — Other Ambulatory Visit: Payer: Self-pay | Admitting: Pharmacy Technician

## 2018-12-14 MED ORDER — MOMETASONE FURO-FORMOTEROL FUM 100-5 MCG/ACT IN AERO: INHALATION_SPRAY | RESPIRATORY_TRACT | 3 refills | Status: DC

## 2018-12-14 MED ORDER — ALBUTEROL SULFATE (2.5 MG/3ML) 0.083% IN NEBU: 2.5000 mg | INHALATION_SOLUTION | Freq: Four times a day (QID) | RESPIRATORY_TRACT | 12 refills | Status: DC | PRN

## 2018-12-14 NOTE — Telephone Encounter (Signed)
I will forward to [place] Simcox to see if she can assist with this.

## 2018-12-14 NOTE — Telephone Encounter (Signed)
Please contact the patient and confirm she is still using this medication. Thanks.

## 2018-12-14 NOTE — Patient Outreach (Signed)
[place] [facility]) Care Management  12/14/2018  LIBERTY SETO [birth year] [id number]   Care coordination call placed to [facility] at Dr. Ellen Henri office in regards to an inbasket message that was received regarding her refills.  Spoke to [place] and informed her to direct all refills to [employer] at phone [phone] and fax [phone]. I also informed that they can be escribed as well. Clarise Cruz informed she would send them over.  Murry Diaz P. Neelah Mannings, [place] Management [phone]

## 2018-12-14 NOTE — Telephone Encounter (Signed)
Prescription printed. Please place on my desk to sign then we can fax.

## 2018-12-14 NOTE — Telephone Encounter (Signed)
Copied from [place] [id number]. Topic: Quick Communication - Rx Refill/Question >> Dec 14, 2018 11:44 AM Sheran Luz wrote: Medication: mometasone-formoterol (DULERA) 100-5 MCG/ACT AERO   Patient is requesting refill of this medication.   Preferred Pharmacy (with phone number or street name):[place] [place] [facility]  [phone] (Phone) [phone] (Fax)

## 2018-12-14 NOTE — Telephone Encounter (Signed)
Sent to pharmacy 

## 2018-12-17 ENCOUNTER — Ambulatory Visit: Payer: Medicare Other | Admitting: Family Medicine

## 2018-12-21 ENCOUNTER — Other Ambulatory Visit: Payer: Self-pay

## 2018-12-21 MED ORDER — ALBUTEROL SULFATE HFA 108 (90 BASE) MCG/ACT IN AERS: INHALATION_SPRAY | RESPIRATORY_TRACT | 2 refills | Status: DC

## 2018-12-21 MED ORDER — MOMETASONE FURO-FORMOTEROL FUM 100-5 MCG/ACT IN AERO: INHALATION_SPRAY | RESPIRATORY_TRACT | 3 refills | Status: DC

## 2018-12-30 DIAGNOSIS — J449 Chronic obstructive pulmonary disease, unspecified: Secondary | ICD-10-CM | POA: Diagnosis not present

## 2018-12-31 ENCOUNTER — Ambulatory Visit (INDEPENDENT_AMBULATORY_CARE_PROVIDER_SITE_OTHER): Payer: Medicare Other | Admitting: Family Medicine

## 2018-12-31 ENCOUNTER — Encounter: Payer: Self-pay | Admitting: Family Medicine

## 2018-12-31 ENCOUNTER — Telehealth: Payer: Self-pay | Admitting: Family Medicine

## 2018-12-31 ENCOUNTER — Other Ambulatory Visit: Payer: Self-pay

## 2018-12-31 DIAGNOSIS — E1159 Type 2 diabetes mellitus with other circulatory complications: Secondary | ICD-10-CM

## 2018-12-31 DIAGNOSIS — Z6841 Body Mass Index (BMI) 40.0 and over, adult: Secondary | ICD-10-CM

## 2018-12-31 DIAGNOSIS — I10 Essential (primary) hypertension: Secondary | ICD-10-CM | POA: Diagnosis not present

## 2018-12-31 DIAGNOSIS — J0100 Acute maxillary sinusitis, unspecified: Secondary | ICD-10-CM | POA: Diagnosis not present

## 2018-12-31 NOTE — Telephone Encounter (Signed)
Patient needs a lab appointment sometime in the next 2 weeks to recheck her liver function test.  Orders have been placed.  Please get her scheduled.  She also needs 25-month follow-up in the office scheduled.  Thanks.

## 2018-12-31 NOTE — Assessment & Plan Note (Signed)
Well-controlled when she was in the office previously.  She will continue her current regimen.

## 2018-12-31 NOTE — Progress Notes (Addendum)
Virtual Visit via Telephone Note  This visit type was conducted due to national recommendations for restrictions regarding the COVID-19 pandemic (e.g. social distancing).  This format is felt to be most appropriate for this patient at this time.  All issues noted in this document were discussed and addressed.  No physical exam was performed (except for noted visual exam findings with Video Visits).    I connected with Martha Woodard on 12/31/18 at 11:30 AM EDT by telephone and verified that I am speaking with the correct person using two identifiers.   I discussed the limitations, risks, security and privacy concerns of performing an evaluation and management service by telephone and the availability of in person appointments. I also discussed with the patient that there may be a patient responsible charge related to this service. The patient expressed understanding and agreed to proceed.  The patient Martha Woodard) was at home and I [name]) was in the office.  We were the participants in this visit.  The patient does not have access to complete a video visit.   Reason for follow-up: 80-month follow-up  History of Present Illness: Sinusitis: Patient notes her symptoms from this are quite a bit improved.  She notes she does still have some allergy symptoms with some sinus pressure and postnasal drip with some cough from the drainage.  No shortness of breath.  No fevers.  She is taking her Spiriva.  She is using over-the-counter allergy medication and Flonase with good benefit.  Diabetes: Typically running around 107.  Taking metformin.  No polyuria or polydipsia.  No hypoglycemia.  She saw ophthalmology in January.  Hypertension: Not checking blood pressures.  Taking Lasix, HCTZ, losartan, and metoprolol.  No chest pain or shortness of breath.  Mild chronic lower extremity edema that is unchanged.  Obesity: She has cut out soda.  She is eating many more green vegetables.  She is doing  some walking.  Her weight has trended down about 6 pounds on her scale.  LFTs mildly elevated previously.  She thinks changing her diet will help with this.   Observations/Objective: No physical exam was completed given that this is a telehealth visit.  Assessment and Plan: Essential hypertension Well-controlled when she was in the office previously.  She will continue her current regimen.  Sinusitis Much improved.  She will continue her allergy medications.  Discussed coronavirus social distancing precautions and reasons to seek medical attention.   DM type 2 (diabetes mellitus, type 2) ([place]) Seems to be well controlled.  She will continue diet and exercise.  Continue metformin.  Morbid obesity with BMI of 45.0-49.9, adult [facility]) Patient is down 6 pounds per her report.  Encouraged continued dietary changes and activity.   Follow Up Instructions: Patient to come into the lab for lab work sometime in the next 2 weeks.  CMA to contact her to schedule this.  She will also be scheduled for a 19-month follow-up.   I discussed the assessment and treatment plan with the patient. The patient was provided an opportunity to ask questions and all were answered. The patient agreed with the plan and demonstrated an understanding of the instructions.   The patient was advised to call back or seek an in-person evaluation if the symptoms worsen or if the condition fails to improve as anticipated.  I provided 15 minutes of non-face-to-face time during this encounter.   [name], MD

## 2018-12-31 NOTE — Assessment & Plan Note (Signed)
Seems to be well controlled.  She will continue diet and exercise.  Continue metformin.

## 2018-12-31 NOTE — Assessment & Plan Note (Addendum)
Much improved.  She will continue her allergy medications.  Discussed coronavirus social distancing precautions and reasons to seek medical attention.

## 2018-12-31 NOTE — Assessment & Plan Note (Signed)
Patient is down 6 pounds per her report.  Encouraged continued dietary changes and activity.

## 2018-12-31 NOTE — Telephone Encounter (Signed)
LMTCB. [place] for pec to schedule lab and f/up appointment.

## 2018-12-31 NOTE — Telephone Encounter (Signed)
Called and spoke with pt. Pt advised and has been scheduled for lab appt f/u 01/14/2019 and 4 month f/u July 29,2020.

## 2019-01-04 ENCOUNTER — Other Ambulatory Visit: Payer: Self-pay | Admitting: Family Medicine

## 2019-01-04 ENCOUNTER — Telehealth: Payer: Self-pay | Admitting: Family Medicine

## 2019-01-04 NOTE — Telephone Encounter (Signed)
No she was not she will need the HFA refilled instead.   Sent to PCP

## 2019-01-04 NOTE — Telephone Encounter (Signed)
Called pt and left a detailed VM advised pt to call me back on my direct number if needed.

## 2019-01-04 NOTE — Telephone Encounter (Signed)
The HFA was sent in to the mail order pharmacy on 12/21/18. You can refill it to a local pharmacy if needed.

## 2019-01-04 NOTE — Telephone Encounter (Signed)
Pt stated she always just gets the dulra and provental HFA

## 2019-01-04 NOTE — Telephone Encounter (Signed)
Please let the patient know that I received a request for an albuterol inhaler from her pharmacy. It states that they do not fill nebulizer solution. Please see if she was able to get nebulizer solution refilled. Thanks.

## 2019-01-11 ENCOUNTER — Telehealth: Payer: Self-pay

## 2019-01-11 NOTE — Telephone Encounter (Signed)
Copied from [place] [id number]. Topic: General - Other >> Jan 11, 2019 11:12 AM Gustavus Messing wrote: Reason for CRM: Patient would like Dr. Marlaine Hind nurse to call her back. Patient did not disclose what about.

## 2019-01-11 NOTE — Telephone Encounter (Signed)
Pt was calling wanting to know when she come into the office could we give her two medical mask and I told pt at this time due to lack of supplies we do not have mask to give out to patient's. Did advise pt to cover her mouth and nose with a scarf or bandana if she is unable to get a mask.

## 2019-01-13 ENCOUNTER — Other Ambulatory Visit: Payer: Self-pay

## 2019-01-13 ENCOUNTER — Telehealth: Payer: Self-pay | Admitting: Family Medicine

## 2019-01-13 ENCOUNTER — Ambulatory Visit (INDEPENDENT_AMBULATORY_CARE_PROVIDER_SITE_OTHER): Payer: Medicare Other | Admitting: Family Medicine

## 2019-01-13 DIAGNOSIS — J0110 Acute frontal sinusitis, unspecified: Secondary | ICD-10-CM

## 2019-01-13 MED ORDER — DOXYCYCLINE HYCLATE 100 MG PO TABS: 100.0000 mg | ORAL_TABLET | Freq: Two times a day (BID) | ORAL | 0 refills | Status: DC

## 2019-01-13 MED ORDER — AZITHROMYCIN 250 MG PO TABS: ORAL_TABLET | ORAL | 0 refills | Status: DC

## 2019-01-13 NOTE — Telephone Encounter (Signed)
Called and spoke with Pt. Pt has a 3:20pm telephone visit today with NP Lauren Guse.

## 2019-01-13 NOTE — Progress Notes (Signed)
Patient ID: Martha Woodard, female   DOB: 11-Sep-1966, 53 y.o.   MRN: [id number]  Virtual Visit via phone Note  This visit type was conducted due to national recommendations for restrictions regarding the COVID-19 pandemic (e.g. social distancing). This format is felt to be most appropriate for this patient at this time.  All issues noted in this document were discussed and addressed.  No physical exam was performed (except for noted visual exam findings with Video Visits).   I connected with Martha Woodard on 01/14/19 at  3:20 PM EDT by  telephone and verified that I am speaking with the correct person using two identifiers. Location patient: home Location provider: [place] Persons participating in the virtual visit: patient, provider  I discussed the limitations, risks, security and privacy concerns of performing an evaluation and management service by telephone and the availability of in person appointments. I also discussed with the patient that there may be a patient responsible charge related to this service. The patient expressed understanding and agreed to proceed.  Patient does not have smart phone or computer to do video visit.  HPI:  Patient and I connected via telephone today due to suspected sinus infection.  Patient has a longstanding history of sinus issues and has severe seasonal allergies especially when the pollen counts are high.  She does use an over-the-counter antihistamine and Flonase every day.  States that her congestion has increasingly gotten worse over the past week and a half, has a lot of pressure above her eyes and above teeth.  When she blows her nose it is thick yellow in color.   Denies shortness of breath or wheezing.  Denies chest pain.  Denies fever or chills.  Denies GI or GU issues.   ROS:   See pertinent positives and negatives per HPI.  Past Medical History:  Diagnosis Date  . CHF (congestive heart failure) ([place])   . Colon polyp 01-01-2016  .  COPD (chronic obstructive pulmonary disease) ([place])   . Diabetes mellitus without complication ([place])   . Diastolic heart failure ([place])   . HLD (hyperlipidemia)   . Hypertension   . Morbid obesity with BMI of 45.0-49.9, adult ([place])   . Multiple lung nodules on CT   . Shortness of breath dyspnea     Past Surgical History:  Procedure Laterality Date  . BIOPSY THYROID Right 02-06-14   NEGATIVE FOR MALIGNANT CELLS proteinaceous material and macrophages.  Marland Kitchen BREAST BIOPSY Left    neg- core  . COLONOSCOPY WITH PROPOFOL N/A 01/01/2016   Procedure: COLONOSCOPY WITH PROPOFOL;  Surgeon: Lucilla Lame, MD;  Location: [facility] ENDOSCOPY;  Service: Endoscopy;  Laterality: N/A;  . thryoid fna Right April 2015   Proteinaceous material and macrophages.  . WISDOM TOOTH EXTRACTION      Family History  Problem Relation Age of Onset  . Cancer Mother 88       uterian and ovarian   . Goiter Mother   . Stroke Father   . Breast cancer Sister 31  . Stroke Brother   . Breast cancer Sister 49  . Heart murmur Sister     Current Outpatient Medications:  .  ACCU-CHEK SOFTCLIX LANCETS lancets, TEST UP TO 4 TIMES A DAY, Disp: 100 each, Rfl: 3 .  albuterol (PROVENTIL) (2.5 MG/3ML) 0.083% nebulizer solution, Take 3 mLs (2.5 mg total) by nebulization every 6 (six) hours as needed for wheezing or shortness of breath., Disp: 75 mL, Rfl: 12 .  albuterol (VENTOLIN HFA) 108 (  90 Base) MCG/ACT inhaler, INHALE 2 PUFFS BY MOUTH 4 TIMES DAILY AS NEEDED., Disp: 18 g, Rfl: 2 .  aspirin EC 81 MG tablet, Take 1 tablet (81 mg total) by mouth daily., Disp: 90 tablet, Rfl: 3 .  blood glucose meter kit and supplies, Dispense based on patient and insurance preference. Use up to four times daily as directed. (FOR ICD-9 250.00, 250.01)., Disp: 1 each, Rfl: 0 .  fluticasone (FLONASE) 50 MCG/ACT nasal spray, USE 1 OR 2 SPRAYS IN EACH NOSTRIL ONCE DAILY., Disp: 16 g, Rfl: 12 .  furosemide (LASIX) 20 MG tablet, Take 1 tablet (20 mg total) by  mouth daily as needed., Disp: 90 tablet, Rfl: 2 .  glucose blood (ACCU-CHEK AVIVA PLUS) test strip, TEST UP TO 4 TIMES A DAY AS DIRECTED, Disp: 100 each, Rfl: 4 .  hydrochlorothiazide (MICROZIDE) 12.5 MG capsule, Take 1 capsule (12.5 mg total) by mouth daily., Disp: 90 capsule, Rfl: 2 .  ketoconazole (NIZORAL) 2 % cream, Apply 1 application topically daily., Disp: 15 g, Rfl: 0 .  ketoconazole (NIZORAL) 2 % shampoo, Apply 1 application topically 2 (two) times a week., Disp: 120 mL, Rfl: 0 .  losartan (COZAAR) 25 MG tablet, Take 1 tablet (25 mg total) by mouth 2 (two) times daily., Disp: 180 tablet, Rfl: 3 .  metFORMIN (GLUCOPHAGE) 500 MG tablet, TAKE 2 TABLETS BY MOUTH  TWICE DAILY WITH MEALS, Disp: 360 tablet, Rfl: 0 .  metoprolol succinate (TOPROL-XL) 25 MG 24 hr tablet, Take 1 tablet (25 mg total) by mouth daily., Disp: 90 tablet, Rfl: 2 .  mometasone-formoterol (DULERA) 100-5 MCG/ACT AERO, INHALE 2 PUFFS INTO LUNG THE LUNGS TWICEDAILY, Disp: 39 g, Rfl: 3 .  Multiple Vitamin (MULTIVITAMIN) tablet, Take 1 tablet by mouth daily., Disp: , Rfl:  .  nystatin (MYCOSTATIN) 100000 UNIT/ML suspension, Take 5 mLs (500,000 Units total) by mouth 4 (four) times daily. Swish and swallow, Disp: 120 mL, Rfl: 0 .  nystatin cream (MYCOSTATIN), Apply 1 application topically 2 (two) times daily., Disp: 30 g, Rfl: 0 .  ondansetron (ZOFRAN ODT) 4 MG disintegrating tablet, Take 1 tablet (4 mg total) by mouth every 8 (eight) hours as needed for nausea or vomiting., Disp: 20 tablet, Rfl: 0 .  OXYGEN, Inhale 2 L into the lungs daily. 2 liters at night, 1 liter when doing activities, and no oxygen when at home during the day., Disp: , Rfl:  .  potassium chloride (K-DUR) 10 MEQ tablet, Take 1 tablet (10 mEq total) by mouth 2 (two) times daily., Disp: 180 tablet, Rfl: 3 .  rosuvastatin (CRESTOR) 20 MG tablet, Take 1 tablet (20 mg total) by mouth daily., Disp: 90 tablet, Rfl: 3 .  tiotropium (SPIRIVA) 18 MCG inhalation capsule,  Place 1 capsule (18 mcg total) into inhaler and inhale daily., Disp: 90 capsule, Rfl: 3 .  azithromycin (ZITHROMAX) 250 MG tablet, Take 2 tablets on day 1, take 1 tablet on days 2-5, Disp: 6 tablet, Rfl: 0 .  doxycycline (VIBRA-TABS) 100 MG tablet, Take 1 tablet (100 mg total) by mouth 2 (two) times daily., Disp: 20 tablet, Rfl: 0  EXAM:  GENERAL: alert, oriented, sounds to be in no acute distress  HEENT: Voice does sound more congested/nasaly than her usual voice.  LUNGS: Speaking in full sentences, breathing rate appears normal, no gasping or wheezing  PSYCH/NEURO: pleasant and cooperative, no obvious depression or anxiety, speech and thought processing grossly intact  ASSESSMENT AND PLAN:  Discussed the following assessment and plan:  Acute frontal sinusitis, recurrence not specified - Plan: azithromycin (ZITHROMAX) 250 MG tablet, doxycycline (VIBRA-TABS) 100 MG tablet  With previous sinus infection it took to different antibiotics to fully resolve infection.  We will again give patient Zithromax and doxycycline course to treat sinus infection.  She has been advised to continue taking her allergy medication and Flonase nasal spray.  Also advised to try doing saline nasal rinses to wash out congestion in sinuses.  Advised to avoid extended time outdoors especially when pollen counts are high.  Advised to keep self well-hydrated with lots of fluids, get good rest and practice diligent handwashing.  Advised when she leaves the home for essential shopping such as going to the grocery store pharmacy to wear some sort of mask.   I discussed the assessment and treatment plan with the patient. The patient was provided an opportunity to ask questions and all were answered. The patient agreed with the plan and demonstrated an understanding of the instructions.   The patient was advised to call back or seek an in-person evaluation if the symptoms worsen or if the condition fails to improve as  anticipated.  Jodelle Green, FNP

## 2019-01-13 NOTE — Telephone Encounter (Signed)
Called and spoke to pt.  Pt thinks she might have another sinus infection.  Pt was treated for sinusitis with Dr. Caryl Bis on 12/07/18.  Pt does not have a smart phone so she is unable to do a DOXYMe visit.  Pt scheduled for a telephone visit today @ 3:20 pm w/ Philis Nettle.  Sharee Pimple, CMA has spoken w/ pt abt appt.

## 2019-01-13 NOTE — Telephone Encounter (Signed)
Copied from [place] [id number]. Topic: General - Other >> Jan 13, 2019  2:48 PM Richardo Priest, NT wrote: Reason for CRM: Patient is calling in requesting medication for her sinus infection. [phone]. [place], [place] [phone] (Phone) [phone] (Fax)

## 2019-01-14 ENCOUNTER — Encounter: Payer: Self-pay | Admitting: Family Medicine

## 2019-01-14 ENCOUNTER — Other Ambulatory Visit (INDEPENDENT_AMBULATORY_CARE_PROVIDER_SITE_OTHER): Payer: Medicare Other

## 2019-01-14 DIAGNOSIS — R7989 Other specified abnormal findings of blood chemistry: Secondary | ICD-10-CM

## 2019-01-14 DIAGNOSIS — R945 Abnormal results of liver function studies: Secondary | ICD-10-CM | POA: Diagnosis not present

## 2019-01-14 LAB — HEPATIC FUNCTION PANEL
ALT: 34 U/L (ref 0–35)
AST: 28 U/L (ref 0–37)
Albumin: 4.4 g/dL (ref 3.5–5.2)
Alkaline Phosphatase: 53 U/L (ref 39–117)
Bilirubin, Direct: 0.1 mg/dL (ref 0.0–0.3)
Total Bilirubin: 0.4 mg/dL (ref 0.2–1.2)
Total Protein: 6.7 g/dL (ref 6.0–8.3)

## 2019-01-20 ENCOUNTER — Telehealth: Payer: Self-pay | Admitting: Pharmacist

## 2019-01-20 ENCOUNTER — Other Ambulatory Visit: Payer: Self-pay | Admitting: Pharmacy Technician

## 2019-01-20 MED ORDER — PROVENTIL HFA 108 (90 BASE) MCG/ACT IN AERS: 2.0000 | INHALATION_SPRAY | Freq: Four times a day (QID) | RESPIRATORY_TRACT | 1 refills | Status: DC | PRN

## 2019-01-20 MED ORDER — MOMETASONE FURO-FORMOTEROL FUM 100-5 MCG/ACT IN AERO: INHALATION_SPRAY | RESPIRATORY_TRACT | 3 refills | Status: DC

## 2019-01-20 NOTE — Telephone Encounter (Signed)
Sent to pharmacy 

## 2019-01-20 NOTE — Patient Outreach (Signed)
Martha Woodard  01/20/2019  Martha Woodard 05-10-1966 [id number]    Incoming call received from patient in regards to [employer] application for Proventil and Ruthe Mannan,  Spoke to patient, HIPAA identifiers verified.  Patient was calling to inquire if she was supposed to be on Albuterol HFA, Ventolin HFA or Proventil HFA with her patient assistance.  Informed patient that [employer] will only provide Proventil HFA as that is the inhaler that the company makes. Patient inquired if I could reach out to her provider's office and explain this to them and to request a 90 days supply.  Informed patient I would reach out to [place] who currently works at the clinic for assistance. Patient requested a call back once the prescriptions have been phone into [facility].  Martha Woodard, [name] [phone]

## 2019-01-20 NOTE — Telephone Encounter (Signed)
Received message from [facility], CPhT regarding needs for patients inhalers from [employer] Patient Assistance via [facility].   Please send 3 month supply with 3 refills for Dulera and Proventil HFA (not Ventolin, has to be changed to Proventil brand) to [facility] - I have added to preferred pharmacy list.

## 2019-01-21 ENCOUNTER — Other Ambulatory Visit: Payer: Self-pay | Admitting: Pharmacy Technician

## 2019-01-21 NOTE — Patient Outreach (Signed)
[facility]) Care Management  01/21/2019  Martha Woodard 1965/10/24 [id number]   Successful outgoing call placed to patient in regards to [employer] application for Proventil HFA and Dulera.  Spoke to patient, HIPAA identifiers verified. Informed patient that Dr. Caryl Bis had taken care of her request and had sent in a 3 nonths supply of both her Proventil HFA inhaler and Dulera inhaler. Informed patient in order to get the rescue inhaler (albuterol) form the PAP program then it would have to be sent at [facility] as that is what [employer]. Informed patient her nebulizer treatment should be called albuterol and her other 3 inhalers would be Proventil HFA (Albuterol hfa), Dulera and Spiriva. Patient verbalized understanding.  Confirmed patient had my name and number if there were any future needs with patient assistance.  Kelle Ruppert P. Jaquan Sadowsky, [place] [facility] [phone]

## 2019-01-29 DIAGNOSIS — J449 Chronic obstructive pulmonary disease, unspecified: Secondary | ICD-10-CM | POA: Diagnosis not present

## 2019-02-17 ENCOUNTER — Other Ambulatory Visit: Payer: Self-pay | Admitting: Family Medicine

## 2019-03-01 DIAGNOSIS — J449 Chronic obstructive pulmonary disease, unspecified: Secondary | ICD-10-CM | POA: Diagnosis not present

## 2019-03-28 DIAGNOSIS — E041 Nontoxic single thyroid nodule: Secondary | ICD-10-CM | POA: Diagnosis not present

## 2019-03-28 DIAGNOSIS — D1030 Benign neoplasm of unspecified part of mouth: Secondary | ICD-10-CM | POA: Diagnosis not present

## 2019-03-31 DIAGNOSIS — J449 Chronic obstructive pulmonary disease, unspecified: Secondary | ICD-10-CM | POA: Diagnosis not present

## 2019-04-25 ENCOUNTER — Other Ambulatory Visit: Payer: Self-pay | Admitting: Family Medicine

## 2019-04-25 DIAGNOSIS — I491 Atrial premature depolarization: Secondary | ICD-10-CM

## 2019-04-27 ENCOUNTER — Ambulatory Visit (INDEPENDENT_AMBULATORY_CARE_PROVIDER_SITE_OTHER): Payer: Medicare Other | Admitting: Family Medicine

## 2019-04-27 ENCOUNTER — Encounter: Payer: Self-pay | Admitting: Family Medicine

## 2019-04-27 ENCOUNTER — Other Ambulatory Visit: Payer: Self-pay

## 2019-04-27 VITALS — Ht 66.0 in | Wt 245.6 lb

## 2019-04-27 DIAGNOSIS — E1159 Type 2 diabetes mellitus with other circulatory complications: Secondary | ICD-10-CM

## 2019-04-27 DIAGNOSIS — J432 Centrilobular emphysema: Secondary | ICD-10-CM

## 2019-04-27 DIAGNOSIS — J9612 Chronic respiratory failure with hypercapnia: Secondary | ICD-10-CM

## 2019-04-27 DIAGNOSIS — E041 Nontoxic single thyroid nodule: Secondary | ICD-10-CM

## 2019-04-27 DIAGNOSIS — J9611 Chronic respiratory failure with hypoxia: Secondary | ICD-10-CM

## 2019-04-27 DIAGNOSIS — D1030 Benign neoplasm of unspecified part of mouth: Secondary | ICD-10-CM

## 2019-04-27 DIAGNOSIS — E782 Mixed hyperlipidemia: Secondary | ICD-10-CM

## 2019-04-27 DIAGNOSIS — I10 Essential (primary) hypertension: Secondary | ICD-10-CM | POA: Diagnosis not present

## 2019-04-27 NOTE — Assessment & Plan Note (Signed)
Previously at goal.  She will continue her current regimen.  She will come in for labs in 2 months.

## 2019-04-27 NOTE — Assessment & Plan Note (Signed)
Noted to be benign by ENT.  She will monitor.

## 2019-04-27 NOTE — Assessment & Plan Note (Signed)
She will monitor for any thyroid changes.  She has deferred any further evaluation at this time.

## 2019-04-27 NOTE — Progress Notes (Signed)
Virtual Visit via telephone Note  This visit type was conducted due to national recommendations for restrictions regarding the COVID-19 pandemic (e.g. social distancing).  This format is felt to be most appropriate for this patient at this time.  All issues noted in this document were discussed and addressed.  No physical exam was performed (except for noted visual exam findings with Video Visits).   I connected with Martha Woodard today at  1:15 PM EDT by telephone and verified that I am speaking with the correct person using two identifiers. Location patient: home Location provider: work  Persons participating in the virtual visit: patient, provider  I discussed the limitations, risks, security and privacy concerns of performing an evaluation and management service by telephone and the availability of in person appointments. I also discussed with the patient that there may be a patient responsible charge related to this service. The patient expressed understanding and agreed to proceed.  Interactive audio and video telecommunications were attempted between this provider and patient, however failed, due to patient having technical difficulties OR patient did not have access to video capability.  We continued and completed visit with audio only.   Reason for visit: follow-up  HPI: DIABETES Disease Monitoring: Blood Sugar ranges-95-125 Polyuria/phagia/dipsia- no      Optho- saw Dr Ellin Mayhew - was told it was all good regarding diabetes in february Medications: Compliance- metformin Hypoglycemic symptoms- only if doesn't eat, if eats a snack improves  Hypertension: Taking Lasix, HCTZ, losartan, and metoprolol.  Not checking blood pressures.  No chest pain or change to chronic shortness of breath.  Minimal ankle edema if she sits for too long.  COPD: Using Dulera and Spiriva.  Rarely uses albuterol.  No wheezing or coughing.  Thyroid nodule: She notes this has not changed in size.  She defers  further imaging of this.  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  She has deferred further evaluation of multinodular goiter.  She was followed by general surgery for this previously.  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  They noted this was a benign lesion and offered to remove it if the patient desired though she opted against this.     ROS: See pertinent positives and negatives per HPI.  Past Medical History:  Diagnosis Date  . CHF (congestive heart failure) ([place])   . Colon polyp 01-01-2016  . COPD (chronic obstructive pulmonary disease) ([place])   . Diabetes mellitus without complication ([place])   . Diastolic heart failure ([place])   . HLD (hyperlipidemia)   . Hypertension   . Morbid obesity with BMI of 45.0-49.9, adult ([place])   . Multiple lung nodules on CT   . Shortness of breath dyspnea     Past Surgical History:  Procedure Laterality Date  . BIOPSY THYROID Right 02-06-14   NEGATIVE FOR MALIGNANT CELLS proteinaceous material and macrophages.  Marland Kitchen BREAST BIOPSY Left    neg- core  . COLONOSCOPY WITH PROPOFOL N/A 01/01/2016   Procedure: COLONOSCOPY WITH PROPOFOL;  Surgeon: Lucilla Lame, MD;  Location: [facility] ENDOSCOPY;  Service: Endoscopy;  Laterality: N/A;  . thryoid fna Right April 2015   Proteinaceous material and macrophages.  . WISDOM TOOTH EXTRACTION      Family History  Problem Relation Age of Onset  . Cancer Mother 72       uterian and ovarian   . Goiter Mother   . Stroke Father   . Breast cancer Sister 63  . Stroke Brother   . Breast  cancer Sister 27  . Heart murmur Sister     SOCIAL HX: Former smoker.   Current Outpatient Medications:  .  ACCU-CHEK SOFTCLIX LANCETS lancets, TEST UP TO 4 TIMES A DAY, Disp: 100 each, Rfl: 3 .  albuterol (PROVENTIL) (2.5 MG/3ML) 0.083% nebulizer solution, Take 3 mLs (2.5 mg total) by nebulization every 6 (six) hours as needed for wheezing or shortness of breath., Disp: 75 mL, Rfl: 12 .  aspirin EC 81 MG tablet, Take 1 tablet (81  mg total) by mouth daily., Disp: 90 tablet, Rfl: 3 .  blood glucose meter kit and supplies, Dispense based on patient and insurance preference. Use up to four times daily as directed. (FOR ICD-9 250.00, 250.01)., Disp: 1 each, Rfl: 0 .  calcium-vitamin D (OSCAL WITH D) 500-200 MG-UNIT tablet, Take 1 tablet by mouth 2 (two) times daily., Disp: , Rfl:  .  fluticasone (FLONASE) 50 MCG/ACT nasal spray, USE 1 OR 2 SPRAYS IN EACH NOSTRIL ONCE DAILY., Disp: 16 g, Rfl: 12 .  furosemide (LASIX) 20 MG tablet, Take 1 tablet (20 mg total) by mouth daily as needed., Disp: 90 tablet, Rfl: 2 .  glucose blood (ACCU-CHEK AVIVA PLUS) test strip, TEST UP TO 4 TIMES A DAY AS DIRECTED, Disp: 100 each, Rfl: 4 .  hydrochlorothiazide (MICROZIDE) 12.5 MG capsule, Take 1 capsule (12.5 mg total) by mouth daily., Disp: 90 capsule, Rfl: 2 .  ketoconazole (NIZORAL) 2 % cream, Apply 1 application topically daily., Disp: 15 g, Rfl: 0 .  ketoconazole (NIZORAL) 2 % shampoo, Apply 1 application topically 2 (two) times a week., Disp: 120 mL, Rfl: 0 .  losartan (COZAAR) 25 MG tablet, Take 1 tablet (25 mg total) by mouth 2 (two) times daily., Disp: 180 tablet, Rfl: 3 .  metFORMIN (GLUCOPHAGE) 500 MG tablet, TAKE 2 TABLETS BY MOUTH  TWICE DAILY WITH MEALS, Disp: 360 tablet, Rfl: 3 .  metoprolol succinate (TOPROL-XL) 25 MG 24 hr tablet, TAKE 1 TABLET BY MOUTH  DAILY, Disp: 90 tablet, Rfl: 3 .  mometasone-formoterol (DULERA) 100-5 MCG/ACT AERO, INHALE 2 PUFFS INTO LUNG THE LUNGS TWICEDAILY, Disp: 39 g, Rfl: 3 .  Multiple Vitamin (MULTIVITAMIN) tablet, Take 1 tablet by mouth daily., Disp: , Rfl:  .  nystatin (MYCOSTATIN) 100000 UNIT/ML suspension, Take 5 mLs (500,000 Units total) by mouth 4 (four) times daily. Swish and swallow, Disp: 120 mL, Rfl: 0 .  nystatin cream (MYCOSTATIN), Apply 1 application topically 2 (two) times daily., Disp: 30 g, Rfl: 0 .  ondansetron (ZOFRAN ODT) 4 MG disintegrating tablet, Take 1 tablet (4 mg total) by mouth  every 8 (eight) hours as needed for nausea or vomiting., Disp: 20 tablet, Rfl: 0 .  OXYGEN, Inhale 2 L into the lungs daily. 2 liters at night, 1 liter when doing activities, and no oxygen when at home during the day., Disp: , Rfl:  .  potassium chloride (K-DUR) 10 MEQ tablet, Take 1 tablet (10 mEq total) by mouth 2 (two) times daily., Disp: 180 tablet, Rfl: 3 .  PROVENTIL HFA 108 (90 Base) MCG/ACT inhaler, Inhale 2 puffs into the lungs every 6 (six) hours as needed for wheezing or shortness of breath., Disp: 3 Inhaler, Rfl: 1 .  rosuvastatin (CRESTOR) 20 MG tablet, Take 1 tablet (20 mg total) by mouth daily., Disp: 90 tablet, Rfl: 3 .  tiotropium (SPIRIVA) 18 MCG inhalation capsule, Place 1 capsule (18 mcg total) into inhaler and inhale daily., Disp: 90 capsule, Rfl: 3 .  vitamin C (ASCORBIC  ACID) 500 MG tablet, Take 500 mg by mouth daily., Disp: , Rfl:   EXAM: This was a telehealth telephone visit and thus no physical exam was completed.  ASSESSMENT AND PLAN:  Discussed the following assessment and plan:  Essential hypertension Previously at goal.  She will continue her current regimen.  She will come in for labs in 2 months.  COPD (chronic obstructive pulmonary disease) with emphysema (HCC) Stable.  Continue current regimen.  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  She will monitor.  DM type 2 (diabetes mellitus, type 2) (HCC) Continue metformin.  Check A1c with lab work.  Thyroid nodule She will monitor for any thyroid changes.  She has deferred any further evaluation at this time.   Social distancing precautions and sick precautions given regarding COVID-19.   I discussed the assessment and treatment plan with the patient. The patient was provided an opportunity to ask questions and all were answered. The patient agreed with the plan and demonstrated an understanding of the instructions.   The patient was advised to call back or seek an in-person evaluation if the  symptoms worsen or if the condition fails to improve as anticipated.  I provided 11 minutes of non-face-to-face time during this encounter.   [name], MD

## 2019-04-27 NOTE — Assessment & Plan Note (Signed)
Continue metformin.  Check A1c with lab work.

## 2019-04-27 NOTE — Assessment & Plan Note (Signed)
Stable  Continue current regimen  

## 2019-05-01 DIAGNOSIS — J449 Chronic obstructive pulmonary disease, unspecified: Secondary | ICD-10-CM | POA: Diagnosis not present

## 2019-06-01 DIAGNOSIS — J449 Chronic obstructive pulmonary disease, unspecified: Secondary | ICD-10-CM | POA: Diagnosis not present

## 2019-06-03 ENCOUNTER — Other Ambulatory Visit: Payer: Self-pay

## 2019-06-03 ENCOUNTER — Ambulatory Visit (INDEPENDENT_AMBULATORY_CARE_PROVIDER_SITE_OTHER): Payer: Medicare Other | Admitting: Family Medicine

## 2019-06-03 DIAGNOSIS — J0191 Acute recurrent sinusitis, unspecified: Secondary | ICD-10-CM

## 2019-06-03 MED ORDER — DOXYCYCLINE HYCLATE 100 MG PO TABS: 100.0000 mg | ORAL_TABLET | Freq: Two times a day (BID) | ORAL | 0 refills | Status: DC

## 2019-06-03 NOTE — Progress Notes (Signed)
Patient ID: Martha Woodard, female   DOB: 1966-08-03, 53 y.o.   MRN: [id number]    Virtual Visit via phone Note  This visit type was conducted due to national recommendations for restrictions regarding the COVID-19 pandemic (e.g. social distancing).  This format is felt to be most appropriate for this patient at this time.  All issues noted in this document were discussed and addressed.  No physical exam was performed (except for noted visual exam findings with Video Visits).   I connected with Glynis Wiegand today at 11:20 AM EDT by telephone and verified that I am speaking with the correct person using two identifiers. Location patient: home Location provider: work or home office Persons participating in the virtual visit: patient, provider  I discussed the limitations, risks, security and privacy concerns of performing an evaluation and management service by telephone and the availability of in person appointments. I also discussed with the patient that there may be a patient responsible charge related to this service. The patient expressed understanding and agreed to proceed.  HPI:  Patient and I connected via telephone due to suspected sinus infection.  Patient has had worsening sinus congestion, thick drainage down back of throat and clearing of throat over the past 2 weeks.  Patient does get sinus infections usually every 4 to 5 months due to combination of chronic sinusitis/bad seasonal allergies that are affected greatly when the seasons change.  Patient is also on chronic oxygen via nasal cannula.  She tries to keep up with using a humidifier and doing saline nasal spray to keep nasal mucosa moist.  No fever or chills.  Denies shortness of breath or wheezing.  Denies chest pain.  Denies body aches.  Denies any GI or GU complaints.  Denies any concern for COVID-19 exposure, states she does not leave the house much and when she does always wears a mask and is diligent with her handwashing.   ROS: See pertinent positives and negatives per HPI.  Past Medical History:  Diagnosis Date  . CHF (congestive heart failure) ([place])   . Colon polyp 01-01-2016  . COPD (chronic obstructive pulmonary disease) ([place])   . Diabetes mellitus without complication ([place])   . Diastolic heart failure ([place])   . HLD (hyperlipidemia)   . Hypertension   . Morbid obesity with BMI of 45.0-49.9, adult ([place])   . Multiple lung nodules on CT   . Shortness of breath dyspnea     Past Surgical History:  Procedure Laterality Date  . BIOPSY THYROID Right 02-06-14   NEGATIVE FOR MALIGNANT CELLS proteinaceous material and macrophages.  Marland Kitchen BREAST BIOPSY Left    neg- core  . COLONOSCOPY WITH PROPOFOL N/A 01/01/2016   Procedure: COLONOSCOPY WITH PROPOFOL;  Surgeon: Lucilla Lame, MD;  Location: [facility] ENDOSCOPY;  Service: Endoscopy;  Laterality: N/A;  . thryoid fna Right April 2015   Proteinaceous material and macrophages.  . WISDOM TOOTH EXTRACTION      Family History  Problem Relation Age of Onset  . Cancer Mother 10       uterian and ovarian   . Goiter Mother   . Stroke Father   . Breast cancer Sister 34  . Stroke Brother   . Breast cancer Sister 62  . Heart murmur Sister    Social History   Tobacco Use  . Smoking status: Former Smoker    Years: 33.00    Quit date: 07/10/2013    Years since quitting: 5.9  . Smokeless tobacco: Never Used  Substance Use Topics  . Alcohol use: No    Alcohol/week: 0.0 standard drinks    Current Outpatient Medications:  .  ACCU-CHEK SOFTCLIX LANCETS lancets, TEST UP TO 4 TIMES A DAY, Disp: 100 each, Rfl: 3 .  albuterol (PROVENTIL) (2.5 MG/3ML) 0.083% nebulizer solution, Take 3 mLs (2.5 mg total) by nebulization every 6 (six) hours as needed for wheezing or shortness of breath., Disp: 75 mL, Rfl: 12 .  aspirin EC 81 MG tablet, Take 1 tablet (81 mg total) by mouth daily., Disp: 90 tablet, Rfl: 3 .  blood glucose meter kit and supplies, Dispense based on patient and  insurance preference. Use up to four times daily as directed. (FOR ICD-9 250.00, 250.01)., Disp: 1 each, Rfl: 0 .  calcium-vitamin D (OSCAL WITH D) 500-200 MG-UNIT tablet, Take 1 tablet by mouth 2 (two) times daily., Disp: , Rfl:  .  fluticasone (FLONASE) 50 MCG/ACT nasal spray, USE 1 OR 2 SPRAYS IN EACH NOSTRIL ONCE DAILY., Disp: 16 g, Rfl: 12 .  furosemide (LASIX) 20 MG tablet, Take 1 tablet (20 mg total) by mouth daily as needed., Disp: 90 tablet, Rfl: 2 .  glucose blood (ACCU-CHEK AVIVA PLUS) test strip, TEST UP TO 4 TIMES A DAY AS DIRECTED, Disp: 100 each, Rfl: 4 .  hydrochlorothiazide (MICROZIDE) 12.5 MG capsule, Take 1 capsule (12.5 mg total) by mouth daily., Disp: 90 capsule, Rfl: 2 .  ketoconazole (NIZORAL) 2 % cream, Apply 1 application topically daily., Disp: 15 g, Rfl: 0 .  ketoconazole (NIZORAL) 2 % shampoo, Apply 1 application topically 2 (two) times a week., Disp: 120 mL, Rfl: 0 .  losartan (COZAAR) 25 MG tablet, Take 1 tablet (25 mg total) by mouth 2 (two) times daily., Disp: 180 tablet, Rfl: 3 .  metFORMIN (GLUCOPHAGE) 500 MG tablet, TAKE 2 TABLETS BY MOUTH  TWICE DAILY WITH MEALS, Disp: 360 tablet, Rfl: 3 .  metoprolol succinate (TOPROL-XL) 25 MG 24 hr tablet, TAKE 1 TABLET BY MOUTH  DAILY, Disp: 90 tablet, Rfl: 3 .  mometasone-formoterol (DULERA) 100-5 MCG/ACT AERO, INHALE 2 PUFFS INTO LUNG THE LUNGS TWICEDAILY, Disp: 39 g, Rfl: 3 .  Multiple Vitamin (MULTIVITAMIN) tablet, Take 1 tablet by mouth daily., Disp: , Rfl:  .  nystatin (MYCOSTATIN) 100000 UNIT/ML suspension, Take 5 mLs (500,000 Units total) by mouth 4 (four) times daily. Swish and swallow, Disp: 120 mL, Rfl: 0 .  nystatin cream (MYCOSTATIN), Apply 1 application topically 2 (two) times daily., Disp: 30 g, Rfl: 0 .  ondansetron (ZOFRAN ODT) 4 MG disintegrating tablet, Take 1 tablet (4 mg total) by mouth every 8 (eight) hours as needed for nausea or vomiting., Disp: 20 tablet, Rfl: 0 .  OXYGEN, Inhale 2 L into the lungs  daily. 2 liters at night, 1 liter when doing activities, and no oxygen when at home during the day., Disp: , Rfl:  .  potassium chloride (K-DUR) 10 MEQ tablet, Take 1 tablet (10 mEq total) by mouth 2 (two) times daily., Disp: 180 tablet, Rfl: 3 .  PROVENTIL HFA 108 (90 Base) MCG/ACT inhaler, Inhale 2 puffs into the lungs every 6 (six) hours as needed for wheezing or shortness of breath., Disp: 3 Inhaler, Rfl: 1 .  rosuvastatin (CRESTOR) 20 MG tablet, Take 1 tablet (20 mg total) by mouth daily., Disp: 90 tablet, Rfl: 3 .  tiotropium (SPIRIVA) 18 MCG inhalation capsule, Place 1 capsule (18 mcg total) into inhaler and inhale daily., Disp: 90 capsule, Rfl: 3 .  vitamin C (ASCORBIC  ACID) 500 MG tablet, Take 500 mg by mouth daily., Disp: , Rfl:   EXAM:  GENERAL: alert, oriented, sounds in no acute distress  SINUS: Voice does sound very nasally and congested while we are speaking on phone.   LUNGS: Speaking in full sentences, no signs of respiratory distress, breathing rate appears normal, no obvious gross SOB, gasping or wheezing. Is on chronic O2 via [place]  PSYCH/NEURO: pleasant and cooperative, no obvious depression or anxiety, speech and thought processing grossly intact  ASSESSMENT AND PLAN:  Discussed the following assessment and plan:  Patient symptoms do sound consistent with an acute sinusitis.  Patient does have recurrent sinus infections related to chronic allergies.  Advised to keep up good fluid intake, do good handwashing, wear mask whenever outside of home, do saline nasal rinses to wash out sinuses and monitor self for any worsening of symptoms.  1. Acute recurrent sinusitis, unspecified location  - doxycycline (VIBRA-TABS) 100 MG tablet; Take 1 tablet (100 mg total) by mouth 2 (two) times daily.  Dispense: 20 tablet; Refill: 0    I discussed the assessment and treatment plan with the patient. The patient was provided an opportunity to ask questions and all were answered. The  patient agreed with the plan and demonstrated an understanding of the instructions.   The patient was advised to call back or seek an in-person evaluation if the symptoms worsen or if the condition fails to improve as anticipated.  I provided 13 minutes of non-face-to-face time during this encounter.   Jodelle Green, FNP

## 2019-06-09 ENCOUNTER — Telehealth: Payer: Self-pay | Admitting: Family Medicine

## 2019-06-09 DIAGNOSIS — B370 Candidal stomatitis: Secondary | ICD-10-CM

## 2019-06-09 MED ORDER — NYSTATIN 100000 UNIT/ML MT SUSP: 5.0000 mL | Freq: Four times a day (QID) | OROMUCOSAL | 0 refills | Status: DC

## 2019-06-09 NOTE — Telephone Encounter (Signed)
FYI Pt called in stating she has possible thrush from having the sinus infection pt states from being on antibiotic. Pt wants to know if something came be called in to pharmacy?  Medication is nystatin (MYCOSTATIN) 100000 UNIT/ML suspension  Pharmacy is [place] scheduled pt with Dr Caryl Bis for tomorrow if you call the Rx in please let me know so I can cancel appt. Thanks  Call pt @ [phone].

## 2019-06-09 NOTE — Telephone Encounter (Signed)
Rx sent in  Can cancel appt for tommorow

## 2019-06-09 NOTE — Telephone Encounter (Signed)
Pt called in stating she has possible thrush from having the sinus infection pt states from being on antibiotic. Pt wants to know if something came be called in to pharmacy?  Medication is nystatin (MYCOSTATIN) 100000 UNIT/ML suspension  Pharmacy is [place] scheduled pt with Dr Caryl Bis for tomorrow if you call the Rx in please let me know so I can cancel appt. Thanks  Call pt @ [phone].

## 2019-06-10 ENCOUNTER — Other Ambulatory Visit: Payer: Self-pay

## 2019-06-10 ENCOUNTER — Ambulatory Visit (INDEPENDENT_AMBULATORY_CARE_PROVIDER_SITE_OTHER): Payer: Medicare Other | Admitting: Family Medicine

## 2019-06-10 ENCOUNTER — Encounter: Payer: Self-pay | Admitting: Family Medicine

## 2019-06-10 DIAGNOSIS — B370 Candidal stomatitis: Secondary | ICD-10-CM | POA: Diagnosis not present

## 2019-06-10 DIAGNOSIS — J0100 Acute maxillary sinusitis, unspecified: Secondary | ICD-10-CM | POA: Diagnosis not present

## 2019-06-10 NOTE — Assessment & Plan Note (Signed)
Symptoms concerning for thrush.  Discussed trial of the nystatin.  She will have her sister pick this up for her.  If not improving she will need an in person visit for evaluation likely at an urgent care given our current protocols.  Discussed if she had any worsening symptoms she would need to be seen in urgent care over the weekend.  Discussed staying at home until her symptoms have improved.

## 2019-06-10 NOTE — Progress Notes (Signed)
Virtual Visit via telephone Note  This visit type was conducted due to national recommendations for restrictions regarding the COVID-19 pandemic (e.g. social distancing).  This format is felt to be most appropriate for this patient at this time.  All issues noted in this document were discussed and addressed.  No physical exam was performed (except for noted visual exam findings with Video Visits).   I connected with Martha Woodard today at  4:30 PM EDT by telephone and verified that I am speaking with the correct person using two identifiers. Location patient: home Location provider: work Persons participating in the virtual visit: patient, provider  I discussed the limitations, risks, security and privacy concerns of performing an evaluation and management service by telephone and the availability of in person appointments. I also discussed with the patient that there may be a patient responsible charge related to this service. The patient expressed understanding and agreed to proceed.  Interactive audio and video telecommunications were attempted between this provider and patient, however failed, due to patient having technical difficulties OR patient did not have access to video capability.  We continued and completed visit with audio only.   Reason for visit: same day  HPI: Thrush: Patient reports she was seen for sinus infection about a week ago.  At that time she had sinus pain and pressure with postnasal drip.  She has taken the antibiotic and has about 4 days left.  She notes this has helped with those symptoms though she has developed thrush in her mouth and blisters around her nose.  She had white patches on her tongue and in her mouth though now it is just red.  She has no trouble swallowing.  No fevers in the last 6 days.  She was not aware that nystatin was sent in yesterday.  She has not been out in public in the last 2 weeks.   ROS: See pertinent positives and negatives per  HPI.  Past Medical History:  Diagnosis Date   CHF (congestive heart failure) ([place])    Colon polyp 01-01-2016   COPD (chronic obstructive pulmonary disease) (HCC)    Diabetes mellitus without complication (HCC)    Diastolic heart failure (HCC)    HLD (hyperlipidemia)    Hypertension    Morbid obesity with BMI of 45.0-49.9, adult ([place])    Multiple lung nodules on CT    Shortness of breath dyspnea     Past Surgical History:  Procedure Laterality Date   BIOPSY THYROID Right 02-06-14   NEGATIVE FOR MALIGNANT CELLS proteinaceous material and macrophages.   BREAST BIOPSY Left    neg- core   COLONOSCOPY WITH PROPOFOL N/A 01/01/2016   Procedure: COLONOSCOPY WITH PROPOFOL;  Surgeon: Lucilla Lame, MD;  Location: [facility] ENDOSCOPY;  Service: Endoscopy;  Laterality: N/A;   thryoid fna Right April 2015   Proteinaceous material and macrophages.   WISDOM TOOTH EXTRACTION      Family History  Problem Relation Age of Onset   Cancer Mother 36       uterian and ovarian    Goiter Mother    Stroke Father    Breast cancer Sister 66   Stroke Brother    Breast cancer Sister 50   Heart murmur Sister     SOCIAL HX: Former smoker.   Current Outpatient Medications:    ACCU-CHEK SOFTCLIX LANCETS lancets, TEST UP TO 4 TIMES A DAY, Disp: 100 each, Rfl: 3   albuterol (PROVENTIL) (2.5 MG/3ML) 0.083% nebulizer solution, Take 3 mLs (  2.5 mg total) by nebulization every 6 (six) hours as needed for wheezing or shortness of breath., Disp: 75 mL, Rfl: 12   aspirin EC 81 MG tablet, Take 1 tablet (81 mg total) by mouth daily., Disp: 90 tablet, Rfl: 3   blood glucose meter kit and supplies, Dispense based on patient and insurance preference. Use up to four times daily as directed. (FOR ICD-9 250.00, 250.01)., Disp: 1 each, Rfl: 0   calcium-vitamin D (OSCAL WITH D) 500-200 MG-UNIT tablet, Take 1 tablet by mouth 2 (two) times daily., Disp: , Rfl:    doxycycline (VIBRA-TABS) 100 MG tablet, Take  1 tablet (100 mg total) by mouth 2 (two) times daily., Disp: 20 tablet, Rfl: 0   fluticasone (FLONASE) 50 MCG/ACT nasal spray, USE 1 OR 2 SPRAYS IN EACH NOSTRIL ONCE DAILY., Disp: 16 g, Rfl: 12   furosemide (LASIX) 20 MG tablet, Take 1 tablet (20 mg total) by mouth daily as needed., Disp: 90 tablet, Rfl: 2   glucose blood (ACCU-CHEK AVIVA PLUS) test strip, TEST UP TO 4 TIMES A DAY AS DIRECTED, Disp: 100 each, Rfl: 4   hydrochlorothiazide (MICROZIDE) 12.5 MG capsule, Take 1 capsule (12.5 mg total) by mouth daily., Disp: 90 capsule, Rfl: 2   ketoconazole (NIZORAL) 2 % cream, Apply 1 application topically daily., Disp: 15 g, Rfl: 0   ketoconazole (NIZORAL) 2 % shampoo, Apply 1 application topically 2 (two) times a week., Disp: 120 mL, Rfl: 0   losartan (COZAAR) 25 MG tablet, Take 1 tablet (25 mg total) by mouth 2 (two) times daily., Disp: 180 tablet, Rfl: 3   metFORMIN (GLUCOPHAGE) 500 MG tablet, TAKE 2 TABLETS BY MOUTH  TWICE DAILY WITH MEALS, Disp: 360 tablet, Rfl: 3   metoprolol succinate (TOPROL-XL) 25 MG 24 hr tablet, TAKE 1 TABLET BY MOUTH  DAILY, Disp: 90 tablet, Rfl: 3   mometasone-formoterol (DULERA) 100-5 MCG/ACT AERO, INHALE 2 PUFFS INTO LUNG THE LUNGS TWICEDAILY, Disp: 39 g, Rfl: 3   Multiple Vitamin (MULTIVITAMIN) tablet, Take 1 tablet by mouth daily., Disp: , Rfl:    nystatin (MYCOSTATIN) 100000 UNIT/ML suspension, Take 5 mLs (500,000 Units total) by mouth 4 (four) times daily. Swish and swallow, Disp: 120 mL, Rfl: 0   nystatin cream (MYCOSTATIN), Apply 1 application topically 2 (two) times daily., Disp: 30 g, Rfl: 0   ondansetron (ZOFRAN ODT) 4 MG disintegrating tablet, Take 1 tablet (4 mg total) by mouth every 8 (eight) hours as needed for nausea or vomiting., Disp: 20 tablet, Rfl: 0   OXYGEN, Inhale 2 L into the lungs daily. 2 liters at night, 1 liter when doing activities, and no oxygen when at home during the day., Disp: , Rfl:    potassium chloride (K-DUR) 10 MEQ  tablet, Take 1 tablet (10 mEq total) by mouth 2 (two) times daily., Disp: 180 tablet, Rfl: 3   PROVENTIL HFA 108 (90 Base) MCG/ACT inhaler, Inhale 2 puffs into the lungs every 6 (six) hours as needed for wheezing or shortness of breath., Disp: 3 Inhaler, Rfl: 1   rosuvastatin (CRESTOR) 20 MG tablet, Take 1 tablet (20 mg total) by mouth daily., Disp: 90 tablet, Rfl: 3   tiotropium (SPIRIVA) 18 MCG inhalation capsule, Place 1 capsule (18 mcg total) into inhaler and inhale daily., Disp: 90 capsule, Rfl: 3   vitamin C (ASCORBIC ACID) 500 MG tablet, Take 500 mg by mouth daily., Disp: , Rfl:   EXAM: This was a telehealth telephone visit and thus no physical exam was  completed.   ASSESSMENT AND PLAN:  Discussed the following assessment and plan:  Thrush Symptoms concerning for thrush.  Discussed trial of the nystatin.  She will have her sister pick this up for her.  If not improving she will need an in person visit for evaluation likely at an urgent care given our current protocols.  Discussed if she had any worsening symptoms she would need to be seen in urgent care over the weekend.  Discussed staying at home until her symptoms have improved.  Sinusitis Symptoms have been improving on doxycycline.  She will complete the course of antibiotics and be evaluated if her symptoms do not continue to improve or if they worsen.    I discussed the assessment and treatment plan with the patient. The patient was provided an opportunity to ask questions and all were answered. The patient agreed with the plan and demonstrated an understanding of the instructions.   The patient was advised to call back or seek an in-person evaluation if the symptoms worsen or if the condition fails to improve as anticipated.  I provided 11 minutes of non-face-to-face time during this encounter.   [name], MD

## 2019-06-10 NOTE — Assessment & Plan Note (Signed)
Symptoms have been improving on doxycycline.  She will complete the course of antibiotics and be evaluated if her symptoms do not continue to improve or if they worsen.

## 2019-06-18 ENCOUNTER — Other Ambulatory Visit: Payer: Self-pay | Admitting: Family Medicine

## 2019-06-27 ENCOUNTER — Other Ambulatory Visit: Payer: Self-pay | Admitting: Cardiovascular Disease

## 2019-06-29 ENCOUNTER — Other Ambulatory Visit (INDEPENDENT_AMBULATORY_CARE_PROVIDER_SITE_OTHER): Payer: Medicare Other

## 2019-06-29 ENCOUNTER — Other Ambulatory Visit: Payer: Self-pay

## 2019-06-29 DIAGNOSIS — I10 Essential (primary) hypertension: Secondary | ICD-10-CM | POA: Diagnosis not present

## 2019-06-29 DIAGNOSIS — Z23 Encounter for immunization: Secondary | ICD-10-CM

## 2019-06-29 DIAGNOSIS — E782 Mixed hyperlipidemia: Secondary | ICD-10-CM

## 2019-06-29 DIAGNOSIS — E1159 Type 2 diabetes mellitus with other circulatory complications: Secondary | ICD-10-CM

## 2019-06-29 LAB — LIPID PANEL
Cholesterol: 97 mg/dL (ref 0–200)
HDL: 43.4 mg/dL (ref >39.00)
LDL Cholesterol: 32 mg/dL (ref 0–99)
NonHDL: 53.45
Total CHOL/HDL Ratio: 2
Triglycerides: 109 mg/dL (ref 0.0–149.0)
VLDL: 21.8 mg/dL (ref 0.0–40.0)

## 2019-06-29 LAB — MICROALBUMIN / CREATININE URINE RATIO
Creatinine,U: 14.4 mg/dL
Microalb Creat Ratio: 4.9 mg/g (ref 0.0–30.0)
Microalb, Ur: <0.7 mg/dL (ref 0.0–1.9)

## 2019-06-29 LAB — COMPREHENSIVE METABOLIC PANEL
ALT: 33 U/L (ref 0–35)
AST: 28 U/L (ref 0–37)
Albumin: 4.2 g/dL (ref 3.5–5.2)
Alkaline Phosphatase: 47 U/L (ref 39–117)
BUN: 7 mg/dL (ref 6–23)
CO2: 33 mEq/L — ABNORMAL HIGH (ref 19–32)
Calcium: 9.7 mg/dL (ref 8.4–10.5)
Chloride: 99 mEq/L (ref 96–112)
Creatinine, Ser: 0.58 mg/dL (ref 0.40–1.20)
GFR: 108.02 mL/min (ref >60.00)
Glucose, Bld: 95 mg/dL (ref 70–99)
Potassium: 3.7 mEq/L (ref 3.5–5.1)
Sodium: 140 mEq/L (ref 135–145)
Total Bilirubin: 0.4 mg/dL (ref 0.2–1.2)
Total Protein: 6.9 g/dL (ref 6.0–8.3)

## 2019-06-29 LAB — HEMOGLOBIN A1C: Hgb A1c MFr Bld: 6.4 % (ref 4.6–6.5)

## 2019-06-30 ENCOUNTER — Telehealth: Payer: Self-pay

## 2019-06-30 NOTE — Telephone Encounter (Signed)
Pt. Given results, verbalizes understanding. 

## 2019-07-01 DIAGNOSIS — J449 Chronic obstructive pulmonary disease, unspecified: Secondary | ICD-10-CM | POA: Diagnosis not present

## 2019-08-01 DIAGNOSIS — J449 Chronic obstructive pulmonary disease, unspecified: Secondary | ICD-10-CM | POA: Diagnosis not present

## 2019-08-10 ENCOUNTER — Other Ambulatory Visit: Payer: Self-pay | Admitting: Pharmacy Technician

## 2019-08-10 ENCOUNTER — Other Ambulatory Visit: Payer: Self-pay

## 2019-08-10 ENCOUNTER — Ambulatory Visit (INDEPENDENT_AMBULATORY_CARE_PROVIDER_SITE_OTHER): Payer: Medicare Other

## 2019-08-10 DIAGNOSIS — Z0000 Encounter for general adult medical examination without abnormal findings: Secondary | ICD-10-CM

## 2019-08-10 NOTE — Progress Notes (Signed)
Subjective:   Martha Woodard is a 53 y.o. female who presents for Medicare Annual (Subsequent) preventive examination.  Review of Systems:  No ROS.  Medicare Wellness Virtual Visit.  Visual/audio telehealth visit, UTA vital signs.   See social history for additional risk factors.   Cardiac Risk Factors include: advanced age (>76mn, >>84women);diabetes mellitus;hypertension     Objective:     Vitals: There were no vitals taken for this visit.  There is no height or weight on file to calculate BMI.  Advanced Directives 08/10/2019 12/03/2018 12/24/2017 09/25/2015 09/25/2015  Does Patient Have a Medical Advance Directive? No No No No No  Would patient like information on creating a medical advance directive? No - Patient declined No - Patient declined Yes (MAU/Ambulatory/Procedural Areas - Information given) Yes - Spiritual care consult ordered -    Tobacco Social History   Tobacco Use  Smoking Status Former Smoker  . Years: 33.00  . Quit date: 07/10/2013  . Years since quitting: 6.0  Smokeless Tobacco Never Used     Counseling given: Not Answered   Clinical Intake:  Pre-visit preparation completed: Yes        Diabetes: Yes(Followed by pcp)  How often do you need to have someone help you when you read instructions, pamphlets, or other written materials from your doctor or pharmacy?: 1 - Never  Interpreter Needed?: No     Past Medical History:  Diagnosis Date  . CH[place] (congestive heart [place]) ([place]   . Colon polyp 01-01-2016  . COPD [place] obstructive [place] disease[place]   . Diabetes mellitus without complication ([place]   . Diastolic heart failure ([place]   . HLD (hyperlipidemia)   . Hypertension   . Morbid obesity with BMI of 45.0-49.9, adult ([place]   . Multiple lung nodules on CT   . Shortness of breath dyspnea    Past Surgical History:  Procedure Laterality Date  . BIOPSY THYROID Right 02-06-14   NEGATIVE FOR MALIGNANT CELLS proteinaceous material  and macrophages.  .Marland KitchenBREAST BIOPSY Left    neg- core  . COLONOSCOPY WITH PROPOFOL N/A 01/01/2016   Procedure: COLONOSCOPY WITH PROPOFOL;  Surgeon: DLucilla Lame MD;  Location: [facility] ENDOSCOPY;  Service: Endoscopy;  Laterality: N/A;  . thryoid fna Right April 2015   Proteinaceous material and macrophages.  . WISDOM TOOTH EXTRACTION     Family History  Problem Relation Age of Onset  . Cancer Mother 764      uterian and ovarian   . Goiter Mother   . Stroke Father   . Breast cancer Sister 569 . Stroke Brother   . Breast cancer Sister 728 . Heart murmur Sister    Social History   Socioeconomic History  . Marital status: Single    Spouse name: Not on file  . Number of children: Not on file  . Years of education: Not on file  . Highest education level: Not on file  Occupational History  . Not on file  Social Needs  . Financial resource strain: Hard  . Food insecurity    Worry: Sometimes true    Inability: Sometimes true  . Transportation needs    Medical: No    Non-medical: No  Tobacco Use  . Smoking status: Former Smoker    Years: 33.00    Quit date: 07/10/2013    Years since quitting: 6.0  . Smokeless tobacco: Never Used  Substance and Sexual Activity  . Alcohol use: No  Alcohol/week: 0.0 standard drinks  . Drug use: No  . Sexual activity: Not Currently  Lifestyle  . Physical activity    Days per week: 7 days    Minutes per session: 20 min  . Stress: Very much  Relationships  . Social connections    Talks on phone: More than three times a week    Gets together: Three times a week    Attends religious service: Never    Active member of club or organization: No    Attends meetings of clubs or organizations: Never    Relationship status: Divorced  Other Topics Concern  . Not on file  Social History Narrative  . Not on file    Outpatient Encounter Medications as of 08/10/2019  Medication Sig  . ACCU-CHEK SOFTCLIX LANCETS lancets TEST UP TO 4 TIMES A DAY  .  albuterol (PROVENTIL) (2.5 MG/3ML) 0.083% nebulizer solution Take 3 mLs (2.5 mg total) by nebulization every 6 (six) hours as needed for wheezing or shortness of breath.  Marland Kitchen aspirin EC 81 MG tablet Take 1 tablet (81 mg total) by mouth daily.  . blood glucose meter kit and supplies Dispense based on patient and insurance preference. Use up to four times daily as directed. (FOR ICD-9 250.00, 250.01).  . calcium-vitamin D (OSCAL WITH D) 500-200 MG-UNIT tablet Take 1 tablet by mouth 2 (two) times daily.  Marland Kitchen doxycycline (VIBRA-TABS) 100 MG tablet Take 1 tablet (100 mg total) by mouth 2 (two) times daily.  . fluticasone (FLONASE) 50 MCG/ACT nasal spray USE 1 OR 2 SPRAYS IN EACH NOSTRIL ONCE DAILY.  . furosemide (LASIX) 20 MG tablet TAKE 1 TABLET BY MOUTH  DAILY AS NEEDED  . glucose blood (ACCU-CHEK AVIVA PLUS) test strip TEST UP TO 4 TIMES A DAY AS DIRECTED  . hydrochlorothiazide (MICROZIDE) 12.5 MG capsule TAKE 1 CAPSULE BY MOUTH  DAILY  . ketoconazole (NIZORAL) 2 % cream Apply 1 application topically daily.  Marland Kitchen ketoconazole (NIZORAL) 2 % shampoo Apply 1 application topically 2 (two) times a week.  . losartan (COZAAR) 25 MG tablet Take 1 tablet (25 mg total) by mouth 2 (two) times daily.  . metFORMIN (GLUCOPHAGE) 500 MG tablet TAKE 2 TABLETS BY MOUTH  TWICE DAILY WITH MEALS  . metoprolol succinate (TOPROL-XL) 25 MG 24 hr tablet TAKE 1 TABLET BY MOUTH  DAILY  . mometasone-formoterol (DULERA) 100-5 MCG/ACT AERO INHALE 2 PUFFS INTO LUNG THE LUNGS TWICEDAILY  . Multiple Vitamin (MULTIVITAMIN) tablet Take 1 tablet by mouth daily.  Marland Kitchen nystatin (MYCOSTATIN) 100000 UNIT/ML suspension Take 5 mLs (500,000 Units total) by mouth 4 (four) times daily. Swish and swallow  . nystatin cream (MYCOSTATIN) Apply 1 application topically 2 (two) times daily.  . ondansetron (ZOFRAN ODT) 4 MG disintegrating tablet Take 1 tablet (4 mg total) by mouth every 8 (eight) hours as needed for nausea or vomiting.  . OXYGEN Inhale 2 L into  the lungs daily. 2 liters at night, 1 liter when doing activities, and no oxygen when at home during the day.  . potassium chloride (K-DUR) 10 MEQ tablet Take 1 tablet (10 mEq total) by mouth 2 (two) times daily.  Marland Kitchen PROVENTIL HFA 108 (90 Base) MCG/ACT inhaler Inhale 2 puffs into the lungs every 6 (six) hours as needed for wheezing or shortness of breath.  . rosuvastatin (CRESTOR) 20 MG tablet Take 1 tablet (20 mg total) by mouth daily.  Marland Kitchen tiotropium (SPIRIVA) 18 MCG inhalation capsule Place 1 capsule (18 mcg total) into inhaler  and inhale daily.  . vitamin C (ASCORBIC ACID) 500 MG tablet Take 500 mg by mouth daily.   No facility-administered encounter medications on file as of 08/10/2019.     Activities of Daily Living In your present state of health, do you have any difficulty performing the following activities: 08/10/2019  Hearing? N  Vision? N  Difficulty concentrating or making decisions? N  Walking or climbing stairs? N  Dressing or bathing? N  Doing errands, shopping? N  Preparing Food and eating ? N  Using the Toilet? N  In the past six months, have you accidently leaked urine? N  Do you have problems with loss of bowel control? N  Managing your Medications? N  Managing your Finances? N  Housekeeping or managing your Housekeeping? N  Some recent data might be hidden    Patient Care Team: Leone Haven, MD as PCP - General (Family Medicine) Bary Castilla, Forest Gleason, MD (General Surgery) Coral Spikes, DO as Consulting Physician (Family Medicine) Minna Merritts, MD as Consulting Physician (Cardiology)    Assessment:   This is a routine wellness examination for Lanie.  Nurse connected with patient 08/10/19 at  8:30 AM EST by a telephone enabled telemedicine application and verified that I am speaking with the correct person using two identifiers. Patient stated full name and DOB. Patient gave permission to continue with virtual visit. Patient's location was at home and  Nurse's location was at [place] office.   Health Maintenance Due: -Foot Exam- followed by pcp -Hgb A1c- 10/01/18 (6.2) Update all pending maintenance due as appropriate.   See completed HM at the end of note.   Eye: Visual acuity not assessed. Virtual visit. Wears corrective lenses. Followed by their ophthalmologist every 12 months.  Retinopathy- none reported  Dental: Visits every 6 months.    Hearing: Demonstrates normal hearing during visit.  Safety:  Patient feels safe at home- yes Patient does have smoke detectors at home- yes Patient does wear sunscreen or protective clothing when in direct sunlight - yes Patient does wear seat belt when in a moving vehicle - yes Patient drives- yes Adequate lighting in walkways free from debris- yes Grab bars and handrails used as appropriate- yes Ambulates with no assistive device Cell phone on person when ambulating outside of the home- yes  Social: Alcohol intake - no  Smoking history- former    Smokers in home? none Illicit drug use? none  Depression: PHQ 2 &9 complete. See screening below. Denies irritability, anhedonia, sadness/tearfullness.  Stable.   Falls: See screening below.    Medication: Taking as directed and without issues.   Covid-19: Precautions and sickness symptoms discussed. Wears mask, social distancing, hand hygiene as appropriate.   Activities of Daily Living Patient denies needing assistance with: household chores, feeding themselves, getting from bed to chair, getting to the toilet, bathing/showering, dressing, managing money, or preparing meals.   Memory: Patient is alert. Patient denies difficulty focusing or concentrating. Correctly identified the president of the [place], season and recall. Patient likes to read, play computer games, complete puzzles for brain stimulation.   BMI- discussed the importance of a healthy diet, water intake and the benefits of aerobic exercise.  Educational material  provided.  Physical activity- walking daily  Diet:  Low sugar Water: 64 ounces Caffeine: 1-2 cup of coffee  Other Providers Patient Care Team: Leone Haven, MD as PCP - General (Family Medicine) Bary Castilla, Forest Gleason, MD (General Surgery) Coral Spikes, DO as Consulting Physician (  Family Medicine) Rockey Situ Kathlene November, MD as Consulting Physician (Cardiology) Exercise Activities and Dietary recommendations Current Exercise Habits: Home exercise routine, Type of exercise: walking, Time (Minutes): 10, Frequency (Times/Week): 5, Weekly Exercise (Minutes/Week): 50, Intensity: Mild  Goals    . Weight (lb) < 155 lb (70.3 kg)     Eat healthy - salad, eggs, healthy soup Exercise - walks 20 minutes daily, COPD limits this       Fall Risk Fall Risk  08/10/2019 06/10/2019 12/24/2017 07/02/2015 06/11/2015  Falls in the past year? 0 0 No Yes No  Number falls in past yr: - 0 - 2 or more -  Injury with Fall? - - - Yes -  Follow up - Falls evaluation completed - - -   Timed Get Up and Go performed: no, virtual visit  Depression Screen PHQ 2/9 Scores 08/10/2019 06/03/2019 10/02/2018 10/01/2018  PHQ - 2 Score 0 0 3 3  PHQ- 9 Score - 0 16 -     Cognitive Function MMSE - Mini Mental State Exam 12/24/2017  Orientation to time 5  Orientation to Place 5  Registration 3  Attention/ Calculation 5  Recall 2  Language- name 2 objects 2  Language- repeat 1  Language- follow 3 step command 3  Language- read & follow direction 1  Write a sentence 1  Copy design 1  Total score 29     6CIT Screen 08/10/2019 12/24/2017  What Year? 0 points 0 points  What month? 0 points 0 points  What time? 0 points 0 points  Count back from 20 0 points 0 points  Months in reverse 0 points 0 points  Repeat phrase 0 points 0 points  Total Score 0 0    Immunization History  Administered Date(s) Administered  . Influenza,inj,Quad PF,6+ Mos 07/02/2015, 08/19/2016, 08/07/2017, 06/16/2018, 06/29/2019  . Pneumococcal  Polysaccharide-23 09/26/2015   Screening Tests Health Maintenance  Topic Date Due  . FOOT EXAM  02/11/2018  . PAP SMEAR-Modifier  02/13/2019  . OPHTHALMOLOGY EXAM  10/19/2019  . HEMOGLOBIN A1C  12/27/2019  . MAMMOGRAM  03/03/2020  . TETANUS/TDAP  06/10/2025  . COLONOSCOPY  12/31/2025  . INFLUENZA VACCINE  Completed  . PNEUMOCOCCAL POLYSACCHARIDE VACCINE AGE 57-64 HIGH RISK  Completed  . Hepatitis C Screening  Completed  . HIV Screening  Completed      Plan:   Keep all routine maintenance appointments.   Follow up 08/29/19 @ 9:00  Medicare Attestation I have personally reviewed: The patient's medical and social history Their use of alcohol, tobacco or illicit drugs Their current medications and supplements The patient's functional ability including ADLs,fall risks, home safety risks, cognitive, and hearing and visual impairment Diet and physical activities Evidence for depression   In addition, I have reviewed and discussed with patient certain preventive protocols, quality metrics, and best practice recommendations. A written personalized care plan for preventive services as well as general preventive health recommendations were provided to patient via mail.     Varney Biles, LPN  14/06/3012

## 2019-08-10 NOTE — Patient Outreach (Signed)
[facility]) Care Management  08/10/2019  Martha Woodard 01-Oct-1965 [id number]  Incoming call received from patient in regards to [facility] application for Spiriva and [employer] application for [employer] and Proventil.  Spoke to patient, HIPAA identifiers verified.  Patient informed she received re enrollment information from [employer] concerning Dulera and Proventil. She also informed that she received a letter from [employer] stating that Ruthe Mannan was being spun off into a newly publicly traded company and [employer] would no longer be offering it thru the PAP. Inquired if patient could fill out the patient portion of the [employer] application and bring it by Dr. Ellen Henri office along with the letter  and put to the attention of embedded [place]. We went over the patient portion together. Patient verbalized understanding.  Patient also inquired if she could start the re enrollment process for the Spiriva thru BI. Inquired if patient had a copy of her social security statement as BI would require that information to be provided with the application. Informed patient I would inquire if [facility] RPh [name] could provide her with a BI application that she could fill out/sign/pick up when she dropped off the other materials. Patient informed she would put the income document with the letter and the [employer] application to drop off at the office either Friday or next week.  Will route note to embedded  [place] and will collaborate with her on 2021 patient assistance.  Kenslee Achorn P. Desmond Tufano, [facility] [phone]

## 2019-08-10 NOTE — Patient Instructions (Addendum)
  [name] , Thank you for taking time to come for your Medicare Wellness Visit. I appreciate your ongoing commitment to your health goals. Please review the following plan we discussed and let me know if I can assist you in the future.   These are the goals we discussed: Goals    . Weight (lb) < 155 lb (70.3 kg)     Eat healthy - salad, eggs, healthy soup Exercise - walks 20 minutes daily, COPD limits this       This is a list of the screening recommended for you and due dates:  Health Maintenance  Topic Date Due  . Complete foot exam   02/11/2018  . Pap Smear  02/13/2019  . Eye exam for diabetics  10/19/2019  . Hemoglobin A1C  12/27/2019  . Mammogram  03/03/2020  . Tetanus Vaccine  06/10/2025  . Colon Cancer Screening  12/31/2025  . Flu Shot  Completed  . Pneumococcal vaccine  Completed  .  Hepatitis C: One time screening is recommended by Center for Disease Control  (CDC) for  adults born from [birth year] through 1967.   Completed  . HIV Screening  Completed

## 2019-08-11 ENCOUNTER — Ambulatory Visit (INDEPENDENT_AMBULATORY_CARE_PROVIDER_SITE_OTHER): Payer: Medicare Other | Admitting: Pharmacist

## 2019-08-11 DIAGNOSIS — J432 Centrilobular emphysema: Secondary | ICD-10-CM | POA: Diagnosis not present

## 2019-08-11 NOTE — Patient Instructions (Signed)
Visit Information  Goals Addressed            This Visit's Progress     Patient Stated   . "I want to stay healthy" (pt-stated)       Current Barriers:  . Polypharmacy; complex patient with multiple comorbidities including T2DM, CHF, COPD, HTN, HLD o COPD: Dulera 100/5 mcg, Spiriva 18 mcg daily; Proventil PRN; patient notes that she is interested in re-applying for patient assistance for 2021 o HTN/CHF: HCTZ 12.5 mg daily, losartan 25 mg daily, metoprolol 25 mg daily, furosemide 20 mg PRN  o HLD: rosuvastatin 20 mg daily  o T2DM: metformin 1000 mg BID  Pharmacist Clinical Goal(s):  Marland Kitchen Over the next 90 days, patient will work with PharmD and provider towards optimized medication management  Interventions: . Comprehensive medication review performed; medication list updated in electronic medical record . Patient has received a Scientist, clinical (histocompatibility and immunogenetics) from [employer]; she will bring this by clinic for prescriber signature . Community education officer for [employer], will collaborate w/ patient and provider for completion of this application  Patient Self Care Activities:  . Patient will take medications as prescribed  Initial goal documentation        The patient verbalized understanding of instructions provided today and declined a print copy of patient instruction materials.   Plan: - Will collaborate with patient, provider and Susy Frizzle, CPhT for follow up  [place], PharmD, [place] Pharmacist [place] [phone]

## 2019-08-11 NOTE — Progress Notes (Signed)
Reviewed.  Agree with plan   Dr Givanni Staron 

## 2019-08-11 NOTE — Chronic Care Management (AMB) (Signed)
Chronic Care Management   Note  08/11/2019 Name: Martha Woodard MRN: [id number] DOB: [birth year]   Subjective:  Martha Woodard is a 53 y.o. year old female who is a primary care patient of Caryl Bis, Angela Adam, MD. The CCM team was consulted for assistance with chronic disease management and care coordination needs.     Martha Woodard was given information about Chronic Care Management services today including:  1. CCM service includes personalized support from designated clinical staff supervised by her physician, including individualized plan of care and coordination with other care providers 2. 24/7 contact phone numbers for assistance for urgent and routine care needs. 3. Service will only be billed when office clinical staff spend 20 minutes or more in a month to coordinate care. 4. Only one practitioner may furnish and bill the service in a calendar month. 5. The patient may stop CCM services at any time (effective at the end of the month) by phone call to the office staff. 6. The patient will be responsible for cost sharing (co-pay) of up to 20% of the service fee (after annual deductible is met).  Patient agreed to services and verbal consent obtained.   Review of patient status, including review of consultants reports, laboratory and other test data, was performed as part of comprehensive evaluation and provision of chronic care management services.   Objective:  Lab Results  Component Value Date   CREATININE 0.58 06/29/2019   CREATININE 0.54 12/07/2018   CREATININE 0.57 12/03/2018    Lab Results  Component Value Date   HGBA1C 6.4 06/29/2019       Component Value Date/Time   CHOL 97 06/29/2019 0856   TRIG 109.0 06/29/2019 0856   HDL 43.40 06/29/2019 0856   CHOLHDL 2 06/29/2019 0856   VLDL 21.8 06/29/2019 0856   LDLCALC 32 06/29/2019 0856   LDLDIRECT 57.0 07/27/2018 0920    Clinical ASCVD: No   BP Readings from Last 3 Encounters:  12/07/18 124/72  12/03/18  129/70  10/01/18 105/68    Allergies  Allergen Reactions  . Augmentin [Amoxicillin-Pot Clavulanate] Itching    Medications Reviewed Today    Reviewed by Dia Crawford, LPN (Licensed Practical Nurse) on 08/10/19 at [phone]  Med List Status: <None>  Medication Order Taking? Sig Documenting Provider Last Dose Status Informant  ACCU-CHEK SOFTCLIX LANCETS lancets [id number]  TEST UP TO 4 TIMES A DAY Cook, Jayce G, DO  Active Self  albuterol (PROVENTIL) (2.5 MG/3ML) 0.083% nebulizer solution [id number]  Take 3 mLs (2.5 mg total) by nebulization every 6 (six) hours as needed for wheezing or shortness of breath. Leone Haven, MD  Active   aspirin EC 81 MG tablet [id number]  Take 1 tablet (81 mg total) by mouth daily. Coral Spikes, DO  Active Self  blood glucose meter kit and supplies [id number]  Dispense based on patient and insurance preference. Use up to four times daily as directed. (FOR ICD-9 250.00, 250.01). Coral Spikes, DO  Active Self  calcium-vitamin D (OSCAL WITH D) 500-200 MG-UNIT tablet [id number]  Take 1 tablet by mouth 2 (two) times daily. [provider]  Active   doxycycline (VIBRA-TABS) 100 MG tablet [id number]  Take 1 tablet (100 mg total) by mouth 2 (two) times daily. Jodelle Green, FNP  Active   fluticasone (FLONASE) 50 MCG/ACT nasal spray [id number]  USE 1 OR 2 SPRAYS IN EACH NOSTRIL ONCE DAILY. Leone Haven, MD  Active Self  furosemide (LASIX) 20 MG tablet [id number]  TAKE 1 TABLET BY MOUTH  DAILY AS NEEDED Rockey Situ, Kathlene November, MD  Active   glucose blood (ACCU-CHEK AVIVA PLUS) test strip [id number]  TEST UP TO 4 TIMES A DAY AS DIRECTED Leone Haven, MD  Active Self  hydrochlorothiazide (MICROZIDE) 12.5 MG capsule [id number]  TAKE 1 CAPSULE BY MOUTH  DAILY Leone Haven, MD  Active   ketoconazole (NIZORAL) 2 % cream [id number]  Apply 1 application topically daily. Leone Haven, MD  Active Self  ketoconazole (NIZORAL) 2 % shampoo [id number]  Apply  1 application topically 2 (two) times a week. Leone Haven, MD  Active Self  losartan (COZAAR) 25 MG tablet [id number]  Take 1 tablet (25 mg total) by mouth 2 (two) times daily. Minna Merritts, MD  Active Self  metFORMIN (GLUCOPHAGE) 500 MG tablet [id number]  TAKE 2 TABLETS BY MOUTH  TWICE DAILY WITH MEALS Leone Haven, MD  Active   metoprolol succinate (TOPROL-XL) 25 MG 24 hr tablet [id number]  TAKE 1 TABLET BY MOUTH  DAILY Leone Haven, MD  Active   mometasone-formoterol [facility]) 100-5 MCG/ACT AERO [id number]  INHALE 2 PUFFS INTO LUNG THE LUNGS TWICEDAILY Leone Haven, MD  Active   Multiple Vitamin (MULTIVITAMIN) tablet [id number]  Take 1 tablet by mouth daily. [provider]  Active Self  nystatin (MYCOSTATIN) 100000 UNIT/ML suspension [id number]  Take 5 mLs (500,000 Units total) by mouth 4 (four) times daily. Swish and swallow Guse, Jacquelynn Cree, FNP  Active   nystatin cream (MYCOSTATIN) [id number]  Apply 1 application topically 2 (two) times daily. Leone Haven, MD  Active Self  ondansetron (ZOFRAN ODT) 4 MG disintegrating tablet [id number]  Take 1 tablet (4 mg total) by mouth every 8 (eight) hours as needed for nausea or vomiting. Earleen Newport, MD  Active   OXYGEN [id number]  Inhale 2 L into the lungs daily. 2 liters at night, 1 liter when doing activities, and no oxygen when at home during the day. [provider]  Active Self  potassium chloride (K-DUR) 10 MEQ tablet [id number]  Take 1 tablet (10 mEq total) by mouth 2 (two) times daily. Minna Merritts, MD  Active Self  PROVENTIL HFA 108 [phone] Base) MCG/ACT inhaler [id number]  Inhale 2 puffs into the lungs every 6 (six) hours as needed for wheezing or shortness of breath. Leone Haven, MD  Active   rosuvastatin (CRESTOR) 20 MG tablet [id number]  Take 1 tablet (20 mg total) by mouth daily. Leone Haven, MD  Active Self  tiotropium [facility]) 18 MCG inhalation capsule [id number]  Place 1  capsule (18 mcg total) into inhaler and inhale daily. Leone Haven, MD  Active Self  vitamin C (ASCORBIC ACID) 500 MG tablet [id number]  Take 500 mg by mouth daily. [provider]  Active            Assessment:   Goals Addressed            This Visit's Progress     Patient Stated   . "I want to stay healthy" (pt-stated)       Current Barriers:  . Polypharmacy; complex patient with multiple comorbidities including T2DM, CHF, COPD, HTN, HLD o COPD: Dulera 100/5 mcg, Spiriva 18 mcg daily; Proventil PRN; patient notes that she is interested in re-applying for patient assistance for 2021 o HTN/CHF: HCTZ 12.5 mg daily, losartan 25 mg daily, metoprolol 25 mg daily, furosemide 20 mg PRN  o HLD: rosuvastatin 20 mg  daily  o T2DM: metformin 1000 mg BID  Pharmacist Clinical Goal(s):  Marland Kitchen Over the next 90 days, patient will work with PharmD and provider towards optimized medication management  Interventions: . Comprehensive medication review performed; medication list updated in electronic medical record . Patient has received a Scientist, clinical (histocompatibility and immunogenetics) from [employer]; she will bring this by clinic for prescriber signature . Community education officer for [employer], will collaborate w/ patient and provider for completion of this application  Patient Self Care Activities:  . Patient will take medications as prescribed  Initial goal documentation        Plan: - Will collaborate with patient, provider and Susy Frizzle, CPhT for follow up  [place], PharmD, [place] Pharmacist [place] [phone]

## 2019-08-17 ENCOUNTER — Other Ambulatory Visit: Payer: Self-pay

## 2019-08-17 ENCOUNTER — Encounter: Payer: Self-pay | Admitting: Pulmonary Disease

## 2019-08-17 ENCOUNTER — Ambulatory Visit: Payer: Medicare Other | Admitting: Pulmonary Disease

## 2019-08-17 VITALS — BP 124/82 | HR 96 | Temp 98.0°F | Ht 64.0 in | Wt 245.0 lb

## 2019-08-17 DIAGNOSIS — J449 Chronic obstructive pulmonary disease, unspecified: Secondary | ICD-10-CM

## 2019-08-17 NOTE — Patient Instructions (Signed)
1.  We would like to consolidate your COPD medications and switch you to Trelegy Ellipta, 1 inhalation daily.  Make sure you rinse your mouth well after use (you may use baking soda in the rinse water to help with preventing thrush).  Let me know when you get approval for this and we can send a prescription in.  2.  We will schedule breathing tests.  3.  We will see her in follow-up in 6 months time call sooner should any new breathing difficulties arise.

## 2019-08-18 ENCOUNTER — Ambulatory Visit: Payer: Self-pay | Admitting: Pharmacist

## 2019-08-18 ENCOUNTER — Telehealth: Payer: Self-pay | Admitting: Pulmonary Disease

## 2019-08-18 NOTE — Telephone Encounter (Signed)
Spoke to pt, who states that Trelegy is not covered by insurance.  I have advised pt to contact insurance company and request covered alternatives.  Pt will call back with update.

## 2019-08-18 NOTE — Telephone Encounter (Signed)
PT called insurance company and states that the only med that would be covered instead of Trelegy would be fluticasone. Pt would like to stay on her current meds or someone find her a med that is covered. Please advise CB# [phone]

## 2019-08-18 NOTE — Chronic Care Management (AMB) (Signed)
  Chronic Care Management   Note  08/18/2019 Name: [name] MRN: [id number] DOB: Jan 18, 1966  Martha Woodard Kauffman is a 53 y.o. year old female who is a primary care patient of Caryl Bis, Angela Adam, MD. The CCM team was consulted for assistance with chronic disease management and care coordination needs.   Received a voicemail from patient inquiring if there was a patient assistance program for Trelegy, as Dr. Patsey Berthold is interested in consolidating Dulera + Spiriva to Trelegy. She noted that Trelegy is not covered on her insurance.   Unfortunately, [employer]'s patient assistance program requires the patient to have spent $600 out of pocket on copays for the calendar year prior to being approved. I called patient back, left a message for her to communicate w/ Dr. Patsey Berthold about this.  [name], PharmD, [place] Pharmacist [facility] [place] [phone]

## 2019-08-18 NOTE — Telephone Encounter (Signed)
Spoke to pt, who stated that per insurance the only covered alternative to Trelegy is fluticasone, however all co pay's will be around $100.00. Pt stated that she would like to switch to back [facility] and spiriva, as she can get patient assistants with these medications.   LG, please advise. Thanks.

## 2019-08-18 NOTE — Progress Notes (Signed)
Reviewed information.  Agree with plan.    Dr Chavy Avera 

## 2019-08-19 NOTE — Telephone Encounter (Signed)
Spoke to pt and relayed below recommendations. Pt stated that she would like to discuss with the pharmacist at Dr. Ellen Henri office,  and see if see could help her get assistants for spiriva respimat. Pt will call back with update.

## 2019-08-19 NOTE — Telephone Encounter (Signed)
That will be fine to keep her on the [facility] and Spiriva, see if they can give Spiriva Respimat.  Dulera was the 200/5 mcg dose.  2 puffs twice a day.  She is currently on Spiriva HandiHaler but would be good if they could switch to Respimat if not she can stay on the HandiHaler.

## 2019-08-19 NOTE — Telephone Encounter (Signed)
Lm to make pt aware 

## 2019-08-22 ENCOUNTER — Ambulatory Visit: Payer: Self-pay | Admitting: Pharmacist

## 2019-08-22 DIAGNOSIS — J432 Centrilobular emphysema: Secondary | ICD-10-CM

## 2019-08-22 NOTE — Progress Notes (Signed)
Reviewed.  Agree with plan   Dr Marylee Belzer 

## 2019-08-22 NOTE — Patient Instructions (Signed)
Visit Information  Goals Addressed            This Visit's Progress     Patient Stated   . "I want to stay healthy" (pt-stated)       Current Barriers:  . Polypharmacy; complex patient with multiple comorbidities including T2DM, CHF, COPD, HTN, HLD o COPD: Dulera 100/5 mcg, Spiriva 18 mcg daily; Proventil PRN; patient notes that she is interested in re-applying for patient assistance for 2021 - Dr. Patsey Berthold requested changing from Spiriva Handihaler to Spiriva Respimat o HTN/CHF: HCTZ 12.5 mg daily, losartan 25 mg daily, metoprolol 25 mg daily, furosemide 20 mg PRN  o HLD: rosuvastatin 20 mg daily  o T2DM: metformin 1000 mg BID  Pharmacist Clinical Goal(s):  Marland Kitchen Over the next 90 days, patient will work with PharmD and provider towards optimized medication management  Interventions: . Will message Dr. Patsey Berthold regarding switch to Respimat. Will fax BI prescription portion of application to [facility] and will inter-office mail [employer] application for [employer] and Proventil to her office for completion.   Patient Self Care Activities:  . Patient will take medications as prescribed  Please see past updates related to this goal by clicking on the "Past Updates" button in the selected goal         The patient verbalized understanding of instructions provided today and declined a print copy of patient instruction materials.   Plan: - Will collaborate w/ [facility] as above  [name], PharmD, [place] Pharmacist [place] [phone]

## 2019-08-22 NOTE — Chronic Care Management (AMB) (Signed)
Chronic Care Management   Follow Up Note   08/22/2019 Name: Martha Woodard MRN: [id number] DOB: April 17, 1966  Referred by: Leone Haven, MD Reason for referral : Chronic Care Management (Medication Management)   Martha Woodard is a 53 y.o. year old female who is a primary care patient of Caryl Bis, Angela Adam, MD. The CCM team was consulted for assistance with chronic disease management and care coordination needs.    Received call from patient regarding medication access needs.   Review of patient status, including review of consultants reports, relevant laboratory and other test results, and collaboration with appropriate care team members and the patient's provider was performed as part of comprehensive patient evaluation and provision of chronic care management services.    SDOH (Social Determinants of Health) screening performed today: Financial Strain . See Care Plan for related entries.   Outpatient Encounter Medications as of 08/22/2019  Medication Sig  . ACCU-CHEK SOFTCLIX LANCETS lancets TEST UP TO 4 TIMES A DAY  . albuterol (PROVENTIL) (2.5 MG/3ML) 0.083% nebulizer solution Take 3 mLs (2.5 mg total) by nebulization every 6 (six) hours as needed for wheezing or shortness of breath.  Marland Kitchen aspirin EC 81 MG tablet Take 1 tablet (81 mg total) by mouth daily.  . blood glucose meter kit and supplies Dispense based on patient and insurance preference. Use up to four times daily as directed. (FOR ICD-9 250.00, 250.01).  . calcium-vitamin D (OSCAL WITH D) 500-200 MG-UNIT tablet Take 1 tablet by mouth 2 (two) times daily.  . fluticasone (FLONASE) 50 MCG/ACT nasal spray USE 1 OR 2 SPRAYS IN EACH NOSTRIL ONCE DAILY.  . furosemide (LASIX) 20 MG tablet TAKE 1 TABLET BY MOUTH  DAILY AS NEEDED  . glucose blood (ACCU-CHEK AVIVA PLUS) test strip TEST UP TO 4 TIMES A DAY AS DIRECTED  . hydrochlorothiazide (MICROZIDE) 12.5 MG capsule TAKE 1 CAPSULE BY MOUTH  DAILY  . ketoconazole (NIZORAL) 2 %  cream Apply 1 application topically daily.  Marland Kitchen ketoconazole (NIZORAL) 2 % shampoo Apply 1 application topically 2 (two) times a week.  . losartan (COZAAR) 25 MG tablet Take 1 tablet (25 mg total) by mouth 2 (two) times daily.  . metFORMIN (GLUCOPHAGE) 500 MG tablet TAKE 2 TABLETS BY MOUTH  TWICE DAILY WITH MEALS  . metoprolol succinate (TOPROL-XL) 25 MG 24 hr tablet TAKE 1 TABLET BY MOUTH  DAILY  . mometasone-formoterol (DULERA) 100-5 MCG/ACT AERO INHALE 2 PUFFS INTO LUNG THE LUNGS TWICEDAILY  . Multiple Vitamin (MULTIVITAMIN) tablet Take 1 tablet by mouth daily.  Marland Kitchen nystatin (MYCOSTATIN) 100000 UNIT/ML suspension Take 5 mLs (500,000 Units total) by mouth 4 (four) times daily. Swish and swallow  . nystatin cream (MYCOSTATIN) Apply 1 application topically 2 (two) times daily.  . ondansetron (ZOFRAN ODT) 4 MG disintegrating tablet Take 1 tablet (4 mg total) by mouth every 8 (eight) hours as needed for nausea or vomiting.  . OXYGEN Inhale 2 L into the lungs daily. 2 liters at night, 1 liter when doing activities, and no oxygen when at home during the day.  . potassium chloride (K-DUR) 10 MEQ tablet Take 1 tablet (10 mEq total) by mouth 2 (two) times daily.  Marland Kitchen PROVENTIL HFA 108 (90 Base) MCG/ACT inhaler Inhale 2 puffs into the lungs every 6 (six) hours as needed for wheezing or shortness of breath.  . rosuvastatin (CRESTOR) 20 MG tablet Take 1 tablet (20 mg total) by mouth daily.  Marland Kitchen tiotropium (SPIRIVA) 18 MCG inhalation capsule Place 1  capsule (18 mcg total) into inhaler and inhale daily.  . vitamin C (ASCORBIC ACID) 500 MG tablet Take 1,000 mg by mouth daily.   . Vitamin E 180 MG CAPS Take 1 capsule by mouth daily.   No facility-administered encounter medications on file as of 08/22/2019.      Goals Addressed            This Visit's Progress     Patient Stated   . "I want to stay healthy" (pt-stated)       Current Barriers:  . Polypharmacy; complex patient with multiple comorbidities  including T2DM, CHF, COPD, HTN, HLD o COPD: Dulera 100/5 mcg, Spiriva 18 mcg daily; Proventil PRN; patient notes that she is interested in re-applying for patient assistance for 2021 - Dr. Patsey Berthold requested changing from Spiriva Handihaler to Spiriva Respimat o HTN/CHF: HCTZ 12.5 mg daily, losartan 25 mg daily, metoprolol 25 mg daily, furosemide 20 mg PRN  o HLD: rosuvastatin 20 mg daily  o T2DM: metformin 1000 mg BID  Pharmacist Clinical Goal(s):  Marland Kitchen Over the next 90 days, patient will work with PharmD and provider towards optimized medication management  Interventions: . Will message Dr. Patsey Berthold regarding switch to Respimat. Will fax BI prescription portion of application to [facility] and will inter-office mail [employer] application for [employer] and Proventil to her office for completion.   Patient Self Care Activities:  . Patient will take medications as prescribed  Please see past updates related to this goal by clicking on the "Past Updates" button in the selected goal          Plan: - Will collaborate w/ [facility] as above  [name], PharmD, [place] Pharmacist [place] [phone]

## 2019-08-24 NOTE — Telephone Encounter (Signed)
Received patient assistance forms from Dr. Judi Saa office to be signed by Dr. Patsey Berthold.  These forms have been completed and faxed back.

## 2019-08-29 ENCOUNTER — Ambulatory Visit (INDEPENDENT_AMBULATORY_CARE_PROVIDER_SITE_OTHER): Payer: Medicare Other | Admitting: Family Medicine

## 2019-08-29 ENCOUNTER — Encounter: Payer: Self-pay | Admitting: Family Medicine

## 2019-08-29 ENCOUNTER — Other Ambulatory Visit: Payer: Self-pay

## 2019-08-29 DIAGNOSIS — R0981 Nasal congestion: Secondary | ICD-10-CM

## 2019-08-29 DIAGNOSIS — J432 Centrilobular emphysema: Secondary | ICD-10-CM | POA: Diagnosis not present

## 2019-08-29 DIAGNOSIS — R002 Palpitations: Secondary | ICD-10-CM

## 2019-08-29 DIAGNOSIS — I10 Essential (primary) hypertension: Secondary | ICD-10-CM

## 2019-08-29 DIAGNOSIS — E1159 Type 2 diabetes mellitus with other circulatory complications: Secondary | ICD-10-CM

## 2019-08-29 NOTE — Assessment & Plan Note (Addendum)
Patient with 2 to 3 days of sinus congestion.  I discussed that this could be the early stages of a sinus infection versus viral illness versus COVID-19 infection.  She would seem relatively low risk for getting COVID-19 given that she does not leave her house very frequently.  Discussed getting her tested for COVID-19 to help rule this out.  If it is negative I would suspect that she has a sinus infection and would consider antibiotic treatment.  Discussed strict quarantine precautions to last at least until we get the test results back.  Advised her to go get tested today.  I will send my note to her cardiologist who she sees later this week so that he is aware she is getting tested and has respiratory symptoms.  Discussed reasons to seek medical attention in the emergency department.

## 2019-08-29 NOTE — Assessment & Plan Note (Signed)
Symptoms could be related to use of albuterol.  Discussed seeing her cardiologist as planned to determine if further evaluation is needed.  Discussed that she should seek medical attention for persistent palpitations.

## 2019-08-29 NOTE — Assessment & Plan Note (Signed)
Adequately controlled on most recent check.  Continue Metformin.

## 2019-08-29 NOTE — Assessment & Plan Note (Signed)
Well-controlled at the pulmonology office.  She will continue her current regimen.  Plan for labs at her next visit.

## 2019-08-29 NOTE — Progress Notes (Signed)
Virtual Visit via telephone Note  This visit type was conducted due to national recommendations for restrictions regarding the COVID-19 pandemic (e.g. social distancing).  This format is felt to be most appropriate for this patient at this time.  All issues noted in this document were discussed and addressed.  No physical exam was performed (except for noted visual exam findings with Video Visits).   I connected with Martha Woodard today at  9:00 AM EST by telephone and verified that I am speaking with the correct person using two identifiers. Location patient: home Location provider: work Persons participating in the virtual visit: patient, provider  I discussed the limitations, risks, security and privacy concerns of performing an evaluation and management service by telephone and the availability of in person appointments. I also discussed with the patient that there may be a patient responsible charge related to this service. The patient expressed understanding and agreed to proceed.  Interactive audio and video telecommunications were attempted between this provider and patient, however failed, due to patient having technical difficulties OR patient did not have access to video capability.  We continued and completed visit with audio only.   Reason for visit: follow-up  HPI: DIABETES Disease Monitoring: Blood Sugar ranges-123 this am Polyuria/phagia/dipsia- no      Optho- UTD Medications: Compliance- taking metformin Hypoglycemic symptoms- no  HYPERTENSION  Disease Monitoring  Home BP Monitoring not checking Chest pain- no    Dyspnea- no Medications  Compliance-  Taking lasix, losartan, metoprolol.  Edema- no  Sinus congestion: Patient notes this has been going on for 2 to 3 days.  She feels tightness in her maxillary and frontal sinuses around her eyes.  She has had some postnasal drip into her chest.  Breathing treatments did help with this.  No fevers though she has been taking  Tylenol about once a day.  Some cough.  No sore throat.  No headaches.  No taste or smell disturbances.  She does note mild shortness of breath with this.  No COVID-19 exposures.  She does go to the grocery store though otherwise is staying home.  COPD: Continues on home O2.  She is still on [place].  No wheezing.  Palpitations: Patient notes over the last couple of days she has intermittently felt as though her heart is racing.  She will look down and see her chest beating quickly.  She has been taking her albuterol nebulizer though this started before use of that medication.  These lasted briefly.  She sees her cardiologist later this week.  ROS: See pertinent positives and negatives per HPI.  Past Medical History:  Diagnosis Date  . CHF (congestive heart failure) ([place])   . Colon polyp 01-01-2016  . COPD (chronic obstructive pulmonary disease) ([place])   . Diabetes mellitus without complication ([place])   . Diastolic heart failure ([place])   . HLD (hyperlipidemia)   . Hypertension   . Morbid obesity with BMI of 45.0-49.9, adult ([place])   . Multiple lung nodules on CT   . Shortness of breath dyspnea     Past Surgical History:  Procedure Laterality Date  . BIOPSY THYROID Right 02-06-14   NEGATIVE FOR MALIGNANT CELLS proteinaceous material and macrophages.  Marland Kitchen BREAST BIOPSY Left    neg- core  . COLONOSCOPY WITH PROPOFOL N/A 01/01/2016   Procedure: COLONOSCOPY WITH PROPOFOL;  Surgeon: Lucilla Lame, MD;  Location: [facility] ENDOSCOPY;  Service: Endoscopy;  Laterality: N/A;  . thryoid fna Right April 2015  Proteinaceous material and macrophages.  . WISDOM TOOTH EXTRACTION      Family History  Problem Relation Age of Onset  . Cancer Mother 52       uterian and ovarian   . Goiter Mother   . Stroke Father   . Breast cancer Sister 4  . Stroke Brother   . Breast cancer Sister 57  . Heart murmur Sister     SOCIAL HX: Former smoker.   Current Outpatient Medications:  .  ACCU-CHEK  SOFTCLIX LANCETS lancets, TEST UP TO 4 TIMES A DAY, Disp: 100 each, Rfl: 3 .  albuterol (PROVENTIL) (2.5 MG/3ML) 0.083% nebulizer solution, Take 3 mLs (2.5 mg total) by nebulization every 6 (six) hours as needed for wheezing or shortness of breath., Disp: 75 mL, Rfl: 12 .  aspirin EC 81 MG tablet, Take 1 tablet (81 mg total) by mouth daily., Disp: 90 tablet, Rfl: 3 .  blood glucose meter kit and supplies, Dispense based on patient and insurance preference. Use up to four times daily as directed. (FOR ICD-9 250.00, 250.01)., Disp: 1 each, Rfl: 0 .  calcium-vitamin D (OSCAL WITH D) 500-200 MG-UNIT tablet, Take 1 tablet by mouth 2 (two) times daily., Disp: , Rfl:  .  fluticasone (FLONASE) 50 MCG/ACT nasal spray, USE 1 OR 2 SPRAYS IN EACH NOSTRIL ONCE DAILY., Disp: 16 g, Rfl: 12 .  furosemide (LASIX) 20 MG tablet, TAKE 1 TABLET BY MOUTH  DAILY AS NEEDED, Disp: 90 tablet, Rfl: 0 .  glucose blood (ACCU-CHEK AVIVA PLUS) test strip, TEST UP TO 4 TIMES A DAY AS DIRECTED, Disp: 100 each, Rfl: 4 .  hydrochlorothiazide (MICROZIDE) 12.5 MG capsule, TAKE 1 CAPSULE BY MOUTH  DAILY, Disp: 90 capsule, Rfl: 3 .  ketoconazole (NIZORAL) 2 % cream, Apply 1 application topically daily., Disp: 15 g, Rfl: 0 .  ketoconazole (NIZORAL) 2 % shampoo, Apply 1 application topically 2 (two) times a week., Disp: 120 mL, Rfl: 0 .  losartan (COZAAR) 25 MG tablet, Take 1 tablet (25 mg total) by mouth 2 (two) times daily., Disp: 180 tablet, Rfl: 3 .  metFORMIN (GLUCOPHAGE) 500 MG tablet, TAKE 2 TABLETS BY MOUTH  TWICE DAILY WITH MEALS, Disp: 360 tablet, Rfl: 3 .  metoprolol succinate (TOPROL-XL) 25 MG 24 hr tablet, TAKE 1 TABLET BY MOUTH  DAILY, Disp: 90 tablet, Rfl: 3 .  mometasone-formoterol (DULERA) 100-5 MCG/ACT AERO, INHALE 2 PUFFS INTO LUNG THE LUNGS TWICEDAILY, Disp: 39 g, Rfl: 3 .  Multiple Vitamin (MULTIVITAMIN) tablet, Take 1 tablet by mouth daily., Disp: , Rfl:  .  nystatin (MYCOSTATIN) 100000 UNIT/ML suspension, Take 5 mLs  (500,000 Units total) by mouth 4 (four) times daily. Swish and swallow, Disp: 120 mL, Rfl: 0 .  nystatin cream (MYCOSTATIN), Apply 1 application topically 2 (two) times daily., Disp: 30 g, Rfl: 0 .  ondansetron (ZOFRAN ODT) 4 MG disintegrating tablet, Take 1 tablet (4 mg total) by mouth every 8 (eight) hours as needed for nausea or vomiting., Disp: 20 tablet, Rfl: 0 .  OXYGEN, Inhale 2 L into the lungs daily. 2 liters at night, 1 liter when doing activities, and no oxygen when at home during the day., Disp: , Rfl:  .  potassium chloride (K-DUR) 10 MEQ tablet, Take 1 tablet (10 mEq total) by mouth 2 (two) times daily., Disp: 180 tablet, Rfl: 3 .  PROVENTIL HFA 108 (90 Base) MCG/ACT inhaler, Inhale 2 puffs into the lungs every 6 (six) hours as needed for wheezing or shortness  of breath., Disp: 3 Inhaler, Rfl: 1 .  rosuvastatin (CRESTOR) 20 MG tablet, Take 1 tablet (20 mg total) by mouth daily., Disp: 90 tablet, Rfl: 3 .  tiotropium (SPIRIVA) 18 MCG inhalation capsule, Place 1 capsule (18 mcg total) into inhaler and inhale daily., Disp: 90 capsule, Rfl: 3 .  vitamin C (ASCORBIC ACID) 500 MG tablet, Take 1,000 mg by mouth daily. , Disp: , Rfl:  .  Vitamin E 180 MG CAPS, Take 1 capsule by mouth daily., Disp: , Rfl:   EXAM: This is a telehealth telephone visit notes no physical exam was completed.  ASSESSMENT AND PLAN:  Discussed the following assessment and plan:  Essential hypertension Well-controlled at the pulmonology office.  She will continue her current regimen.  Plan for labs at her next visit.  COPD (chronic obstructive pulmonary disease) with emphysema ([place]) She will continue Spiriva and Dulera.  Sinus congestion Patient with 2 to 3 days of sinus congestion.  I discussed that this could be the early stages of a sinus infection versus viral illness versus COVID-19 infection.  She would seem relatively low risk for getting COVID-19 given that she does not leave her house very frequently.   Discussed getting her tested for COVID-19 to help rule this out.  If it is negative I would suspect that she has a sinus infection and would consider antibiotic treatment.  Discussed strict quarantine precautions to last at least until we get the test results back.  Advised her to go get tested today.  I will send my note to her cardiologist who she sees later this week so that he is aware she is getting tested and has respiratory symptoms.  Discussed reasons to seek medical attention in the emergency department.  Palpitations Symptoms could be related to use of albuterol.  Discussed seeing her cardiologist as planned to determine if further evaluation is needed.  Discussed that she should seek medical attention for persistent palpitations.  DM type 2 (diabetes mellitus, type 2) ([place]) Adequately controlled on most recent check.  Continue Metformin.    I discussed the assessment and treatment plan with the patient. The patient was provided an opportunity to ask questions and all were answered. The patient agreed with the plan and demonstrated an understanding of the instructions.   The patient was advised to call back or seek an in-person evaluation if the symptoms worsen or if the condition fails to improve as anticipated.  I provided 19 minutes of non-face-to-face time during this encounter.   [name], MD

## 2019-08-29 NOTE — Assessment & Plan Note (Signed)
She will continue Spiriva and Dulera.

## 2019-08-31 DIAGNOSIS — J449 Chronic obstructive pulmonary disease, unspecified: Secondary | ICD-10-CM | POA: Diagnosis not present

## 2019-09-02 ENCOUNTER — Other Ambulatory Visit: Payer: Self-pay

## 2019-09-02 ENCOUNTER — Telehealth: Payer: Self-pay | Admitting: *Deleted

## 2019-09-02 DIAGNOSIS — Z20822 Contact with and (suspected) exposure to covid-19: Secondary | ICD-10-CM

## 2019-09-02 NOTE — Telephone Encounter (Signed)
Copied from [place] [id number]. Topic: General - Other >> Sep 02, 2019  1:26 PM Berneta Levins wrote: Reason for CRM:   Pt wants PCP to know that she went and was tested for COVID today at [facility].

## 2019-09-02 NOTE — Telephone Encounter (Signed)
Noted. Will await the results.

## 2019-09-06 ENCOUNTER — Other Ambulatory Visit: Payer: Self-pay

## 2019-09-06 ENCOUNTER — Telehealth: Payer: Self-pay | Admitting: *Deleted

## 2019-09-06 ENCOUNTER — Ambulatory Visit (INDEPENDENT_AMBULATORY_CARE_PROVIDER_SITE_OTHER): Payer: Medicare Other | Admitting: Family Medicine

## 2019-09-06 ENCOUNTER — Telehealth: Payer: Self-pay | Admitting: Family Medicine

## 2019-09-06 ENCOUNTER — Encounter: Payer: Self-pay | Admitting: Family Medicine

## 2019-09-06 DIAGNOSIS — U071 COVID-19: Secondary | ICD-10-CM | POA: Diagnosis not present

## 2019-09-06 LAB — NOVEL CORONAVIRUS, NAA: SARS-CoV-2, NAA: DETECTED — AB

## 2019-09-06 MED ORDER — DOXYCYCLINE HYCLATE 100 MG PO TABS: 100.0000 mg | ORAL_TABLET | Freq: Two times a day (BID) | ORAL | 0 refills | Status: DC

## 2019-09-06 NOTE — Assessment & Plan Note (Signed)
Patient with COVID-19.  Discussed quarantine precautions and duration of quarantine to be at least 10 days from onset of symptoms and to have had at least 3 days of no fever without the use of Tylenol or ibuprofen and to have had at least 3 days of resolving symptoms.  I did discuss is possible that she could have a secondary bacterial infection with sinusitis or bronchitis.  We will treat with doxycycline to see if that is beneficial.  She will continue her albuterol nebulizer treatments.  She can continue Tylenol as needed.  She will have her sister pick up the doxycycline and drop it at her door without coming into the house.  I will have somebody from the office call her on Friday to check in.  I advised that she could always contact us at any point if she had any questions.  I discussed reasons for to seek medical attention in the emergency department.

## 2019-09-06 NOTE — Progress Notes (Signed)
Virtual Visit via telephone Note  This visit type was conducted due to national recommendations for restrictions regarding the COVID-19 pandemic (e.g. social distancing).  This format is felt to be most appropriate for this patient at this time.  All issues noted in this document were discussed and addressed.  No physical exam was performed (except for noted visual exam findings with Video Visits).   I connected with Martha Woodard today at  2:00 PM EST by telephone and verified that I am speaking with the correct person using two identifiers. Location patient: home Location provider: home office Persons participating in the virtual visit: patient, provider  I discussed the limitations, risks, security and privacy concerns of performing an evaluation and management service by telephone and the availability of in person appointments. I also discussed with the patient that there may be a patient responsible charge related to this service. The patient expressed understanding and agreed to proceed.  Interactive audio and video telecommunications were attempted between this provider and patient, however failed, due to patient having technical difficulties OR patient did not have access to video capability.  We continued and completed visit with audio only.   Reason for visit: Same day visit.  HPI: [id number]: Patient received her positive test result today.  She has had cough, congestion, and fever since 08/29/2019.  She has had some soreness in her ribs and abdominal muscles related to coughing.  No pain in those areas when she is not coughing.  She has had some shortness of breath and has had to use her oxygen during the day at times.  She has been on 2 L nasal cannula.  She does wear that at night.  T-max 101 F.  She has been taking Tylenol which is beneficial for her fever.  Her head feels stopped up.  Her albuterol nebulizers have been helpful.  She feels like most of her symptoms are in her throat  and her head.  She does not feel as though this is gone into her lungs yet.  She feels as though she has bronchitis.   ROS: See pertinent positives and negatives per HPI.  Past Medical History:  Diagnosis Date  . CHF (congestive heart failure) ([place])   . Colon polyp 01-01-2016  . COPD (chronic obstructive pulmonary disease) ([place])   . Diabetes mellitus without complication ([place])   . Diastolic heart failure ([place])   . HLD (hyperlipidemia)   . Hypertension   . Morbid obesity with BMI of 45.0-49.9, adult ([place])   . Multiple lung nodules on CT   . Shortness of breath dyspnea     Past Surgical History:  Procedure Laterality Date  . BIOPSY THYROID Right 02-06-14   NEGATIVE FOR MALIGNANT CELLS proteinaceous material and macrophages.  Marland Kitchen BREAST BIOPSY Left    neg- core  . COLONOSCOPY WITH PROPOFOL N/A 01/01/2016   Procedure: COLONOSCOPY WITH PROPOFOL;  Surgeon: Lucilla Lame, MD;  Location: [facility] ENDOSCOPY;  Service: Endoscopy;  Laterality: N/A;  . thryoid fna Right April 2015   Proteinaceous material and macrophages.  . WISDOM TOOTH EXTRACTION      Family History  Problem Relation Age of Onset  . Cancer Mother 44       uterian and ovarian   . Goiter Mother   . Stroke Father   . Breast cancer Sister 44  . Stroke Brother   . Breast cancer Sister 72  . Heart murmur Sister     SOCIAL HX: Former smoker   Current Outpatient  Medications:  .  ACCU-CHEK SOFTCLIX LANCETS lancets, TEST UP TO 4 TIMES A DAY, Disp: 100 each, Rfl: 3 .  albuterol (PROVENTIL) (2.5 MG/3ML) 0.083% nebulizer solution, Take 3 mLs (2.5 mg total) by nebulization every 6 (six) hours as needed for wheezing or shortness of breath., Disp: 75 mL, Rfl: 12 .  aspirin EC 81 MG tablet, Take 1 tablet (81 mg total) by mouth daily., Disp: 90 tablet, Rfl: 3 .  blood glucose meter kit and supplies, Dispense based on patient and insurance preference. Use up to four times daily as directed. (FOR ICD-9 250.00, 250.01)., Disp: 1 each, Rfl: 0  .  calcium-vitamin D (OSCAL WITH D) 500-200 MG-UNIT tablet, Take 1 tablet by mouth 2 (two) times daily., Disp: , Rfl:  .  fluticasone (FLONASE) 50 MCG/ACT nasal spray, USE 1 OR 2 SPRAYS IN EACH NOSTRIL ONCE DAILY., Disp: 16 g, Rfl: 12 .  furosemide (LASIX) 20 MG tablet, TAKE 1 TABLET BY MOUTH  DAILY AS NEEDED, Disp: 90 tablet, Rfl: 0 .  glucose blood (ACCU-CHEK AVIVA PLUS) test strip, TEST UP TO 4 TIMES A DAY AS DIRECTED, Disp: 100 each, Rfl: 4 .  hydrochlorothiazide (MICROZIDE) 12.5 MG capsule, TAKE 1 CAPSULE BY MOUTH  DAILY, Disp: 90 capsule, Rfl: 3 .  ketoconazole (NIZORAL) 2 % cream, Apply 1 application topically daily., Disp: 15 g, Rfl: 0 .  ketoconazole (NIZORAL) 2 % shampoo, Apply 1 application topically 2 (two) times a week., Disp: 120 mL, Rfl: 0 .  losartan (COZAAR) 25 MG tablet, Take 1 tablet (25 mg total) by mouth 2 (two) times daily., Disp: 180 tablet, Rfl: 3 .  metFORMIN (GLUCOPHAGE) 500 MG tablet, TAKE 2 TABLETS BY MOUTH  TWICE DAILY WITH MEALS, Disp: 360 tablet, Rfl: 3 .  metoprolol succinate (TOPROL-XL) 25 MG 24 hr tablet, TAKE 1 TABLET BY MOUTH  DAILY, Disp: 90 tablet, Rfl: 3 .  mometasone-formoterol (DULERA) 100-5 MCG/ACT AERO, INHALE 2 PUFFS INTO LUNG THE LUNGS TWICEDAILY, Disp: 39 g, Rfl: 3 .  Multiple Vitamin (MULTIVITAMIN) tablet, Take 1 tablet by mouth daily., Disp: , Rfl:  .  nystatin (MYCOSTATIN) 100000 UNIT/ML suspension, Take 5 mLs (500,000 Units total) by mouth 4 (four) times daily. Swish and swallow, Disp: 120 mL, Rfl: 0 .  nystatin cream (MYCOSTATIN), Apply 1 application topically 2 (two) times daily., Disp: 30 g, Rfl: 0 .  ondansetron (ZOFRAN ODT) 4 MG disintegrating tablet, Take 1 tablet (4 mg total) by mouth every 8 (eight) hours as needed for nausea or vomiting., Disp: 20 tablet, Rfl: 0 .  OXYGEN, Inhale 2 L into the lungs daily. 2 liters at night, 1 liter when doing activities, and no oxygen when at home during the day., Disp: , Rfl:  .  potassium chloride (K-DUR)  10 MEQ tablet, Take 1 tablet (10 mEq total) by mouth 2 (two) times daily., Disp: 180 tablet, Rfl: 3 .  PROVENTIL HFA 108 (90 Base) MCG/ACT inhaler, Inhale 2 puffs into the lungs every 6 (six) hours as needed for wheezing or shortness of breath., Disp: 3 Inhaler, Rfl: 1 .  rosuvastatin (CRESTOR) 20 MG tablet, Take 1 tablet (20 mg total) by mouth daily., Disp: 90 tablet, Rfl: 3 .  tiotropium (SPIRIVA) 18 MCG inhalation capsule, Place 1 capsule (18 mcg total) into inhaler and inhale daily., Disp: 90 capsule, Rfl: 3 .  vitamin C (ASCORBIC ACID) 500 MG tablet, Take 1,000 mg by mouth daily. , Disp: , Rfl:  .  Vitamin E 180 MG CAPS, Take 1  capsule by mouth daily., Disp: , Rfl:  .  doxycycline (VIBRA-TABS) 100 MG tablet, Take 1 tablet (100 mg total) by mouth 2 (two) times daily., Disp: 14 tablet, Rfl: 0  EXAM: This is a telehealth telephone visit and thus no physical exam was completed.  ASSESSMENT AND PLAN:  Discussed the following assessment and plan:  COVID-19 Patient with COVID-19.  Discussed quarantine precautions and duration of quarantine to be at least 10 days from onset of symptoms and to have had at least 3 days of no fever without the use of Tylenol or ibuprofen and to have had at least 3 days of resolving symptoms.  I did discuss is possible that she could have a secondary bacterial infection with sinusitis or bronchitis.  We will treat with doxycycline to see if that is beneficial.  She will continue her albuterol nebulizer treatments.  She can continue Tylenol as needed.  She will have her sister pick up the doxycycline and drop it at her door without coming into the house.  I will have somebody from the office call her on Friday to check in.  I advised that she could always contact us at any point if she had any questions.  I discussed reasons for to seek medical attention in the emergency department.    I discussed the assessment and treatment plan with the patient. The patient was  provided an opportunity to ask questions and all were answered. The patient agreed with the plan and demonstrated an understanding of the instructions.   The patient was advised to call back or seek an in-person evaluation if the symptoms worsen or if the condition fails to improve as anticipated.  I provided 16 minutes of non-face-to-face time during this encounter.   [name], MD

## 2019-09-06 NOTE — Telephone Encounter (Signed)
Can you call this patient on Friday to see how she is doing with her COVID19?

## 2019-09-06 NOTE — Telephone Encounter (Signed)
Pt returning a call pertaining to her COVID-19 test result. I let her know her result was detected/positive meaning she has the virus.    I went over the 10 day protocol regarding quarantine time.   She began having symptoms (cough, runny nose, fever) on 08/29/2019.  I let her know to quarantine through 09/07/2019 and as long as her symptoms were resolving and she was fever free for 3 straight days without the use of Tylenol/Ibuprofen she could end quarantine.    She lives alone so no one is being exposed.   "I've not been going anywhere".   She has a virtual visit scheduled for today with Dr. [name] her COVID result and the fact she is coughing a lot.  "So much my ribs and back hurt". I let her know if she started having shortness of breath or chest pressure/tightness to be in touch with Dr. Caryl Bis or go to the ED.   She verbalized understanding.   I went over the cleaning, wearing masks, washing hands frequently, social distancing, etc. I instructed her to follow whatever instructions Dr. Caryl Bis gives her today.  She was agreeable to this. Health Dept has been contacted from an earlier call to her.

## 2019-09-07 ENCOUNTER — Telehealth: Payer: Self-pay | Admitting: Nurse Practitioner

## 2019-09-07 NOTE — Telephone Encounter (Signed)
Called to Discuss with patient about Covid symptoms and the use of bamlanivimab, a monoclonal antibody infusion for those with mild to moderate Covid symptoms and at a high risk of hospitalization.     Pt is qualified for this infusion at the [facility] infusion center due to co-morbid conditions and/or a member of an at-risk group.    Patient Active Problem List   Diagnosis Date Noted  . COVID-19 09/06/2019  . Thrush 06/10/2019  . 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 Seborrheic dermatitis 10/02/2018  . Light headedness 06/17/2018  . Foot pain 06/17/2018  . Sinus congestion 03/01/2018  . Allergic rhinitis 11/09/2017  . Venous insufficiency 08/07/2017  . Chronic respiratory failure ([place]) 11/13/2016  . Skin cyst 08/19/2016  . Thyroid nodule 11/28/2015  . COPD (chronic obstructive pulmonary disease) with emphysema ([place]) 11/13/2015  . Chronic diastolic heart failure ([place]) 11/13/2015  . COPD exacerbation ([place]) 10/01/2015  . Morbid obesity with BMI of 45.0-49.9, adult ([place]) 10/01/2015  . Obesity hypoventilation syndrome ([place]) 10/01/2015  . Eczema 10/01/2015  . Essential hypertension 10/01/2015  . Palpitations 06/26/2014  . DM type 2 (diabetes mellitus, type 2) ([place]) 06/26/2014  . Hyperlipidemia 06/26/2014  . Lung nodule seen on imaging study 02/06/2014      Unable to reach pt

## 2019-09-08 ENCOUNTER — Ambulatory Visit (INDEPENDENT_AMBULATORY_CARE_PROVIDER_SITE_OTHER): Payer: Medicare Other | Admitting: Pharmacist

## 2019-09-08 DIAGNOSIS — E1159 Type 2 diabetes mellitus with other circulatory complications: Secondary | ICD-10-CM | POA: Diagnosis not present

## 2019-09-08 DIAGNOSIS — J432 Centrilobular emphysema: Secondary | ICD-10-CM

## 2019-09-08 NOTE — Chronic Care Management (AMB) (Addendum)
Chronic Care Management   Follow Up Note   09/08/2019 Name: Martha Woodard MRN: [id number] DOB: March 30, 1966  Referred by: Leone Haven, MD Reason for referral : Chronic Care Management (Medication Management)   Martha Woodard is a 53 y.o. year old female who is a primary care patient of Caryl Bis, Angela Adam, MD. The CCM team was consulted for assistance with chronic disease management and care coordination needs.    Care coordination completed today.  Review of patient status, including review of consultants reports, relevant laboratory and other test results, and collaboration with appropriate care team members and the patient's provider was performed as part of comprehensive patient evaluation and provision of chronic care management services.    SDOH (Social Determinants of Health) screening performed today: Financial Strain . See Care Plan for related entries.   Outpatient Encounter Medications as of 09/08/2019  Medication Sig  . ACCU-CHEK SOFTCLIX LANCETS lancets TEST UP TO 4 TIMES A DAY  . albuterol (PROVENTIL) (2.5 MG/3ML) 0.083% nebulizer solution Take 3 mLs (2.5 mg total) by nebulization every 6 (six) hours as needed for wheezing or shortness of breath.  Marland Kitchen aspirin EC 81 MG tablet Take 1 tablet (81 mg total) by mouth daily.  . blood glucose meter kit and supplies Dispense based on patient and insurance preference. Use up to four times daily as directed. (FOR ICD-9 250.00, 250.01).  . calcium-vitamin D (OSCAL WITH D) 500-200 MG-UNIT tablet Take 1 tablet by mouth 2 (two) times daily.  Marland Kitchen doxycycline (VIBRA-TABS) 100 MG tablet Take 1 tablet (100 mg total) by mouth 2 (two) times daily.  . fluticasone (FLONASE) 50 MCG/ACT nasal spray USE 1 OR 2 SPRAYS IN EACH NOSTRIL ONCE DAILY.  . furosemide (LASIX) 20 MG tablet TAKE 1 TABLET BY MOUTH  DAILY AS NEEDED  . glucose blood (ACCU-CHEK AVIVA PLUS) test strip TEST UP TO 4 TIMES A DAY AS DIRECTED  . hydrochlorothiazide (MICROZIDE) 12.5  MG capsule TAKE 1 CAPSULE BY MOUTH  DAILY  . ketoconazole (NIZORAL) 2 % cream Apply 1 application topically daily.  Marland Kitchen ketoconazole (NIZORAL) 2 % shampoo Apply 1 application topically 2 (two) times a week.  . losartan (COZAAR) 25 MG tablet Take 1 tablet (25 mg total) by mouth 2 (two) times daily.  . metFORMIN (GLUCOPHAGE) 500 MG tablet TAKE 2 TABLETS BY MOUTH  TWICE DAILY WITH MEALS  . metoprolol succinate (TOPROL-XL) 25 MG 24 hr tablet TAKE 1 TABLET BY MOUTH  DAILY  . mometasone-formoterol (DULERA) 100-5 MCG/ACT AERO INHALE 2 PUFFS INTO LUNG THE LUNGS TWICEDAILY  . Multiple Vitamin (MULTIVITAMIN) tablet Take 1 tablet by mouth daily.  Marland Kitchen nystatin (MYCOSTATIN) 100000 UNIT/ML suspension Take 5 mLs (500,000 Units total) by mouth 4 (four) times daily. Swish and swallow  . nystatin cream (MYCOSTATIN) Apply 1 application topically 2 (two) times daily.  . ondansetron (ZOFRAN ODT) 4 MG disintegrating tablet Take 1 tablet (4 mg total) by mouth every 8 (eight) hours as needed for nausea or vomiting.  . OXYGEN Inhale 2 L into the lungs daily. 2 liters at night, 1 liter when doing activities, and no oxygen when at home during the day.  . potassium chloride (K-DUR) 10 MEQ tablet Take 1 tablet (10 mEq total) by mouth 2 (two) times daily.  Marland Kitchen PROVENTIL HFA 108 (90 Base) MCG/ACT inhaler Inhale 2 puffs into the lungs every 6 (six) hours as needed for wheezing or shortness of breath.  . rosuvastatin (CRESTOR) 20 MG tablet Take 1 tablet (20 mg  total) by mouth daily.  Marland Kitchen tiotropium (SPIRIVA) 18 MCG inhalation capsule Place 1 capsule (18 mcg total) into inhaler and inhale daily.  . vitamin C (ASCORBIC ACID) 500 MG tablet Take 1,000 mg by mouth daily.   . Vitamin E 180 MG CAPS Take 1 capsule by mouth daily.   No facility-administered encounter medications on file as of 09/08/2019.     Goals Addressed            This Visit's Progress     Patient Stated   . "I want to stay healthy" (pt-stated)       Current  Barriers:  . Polypharmacy; complex patient with multiple comorbidities including T2DM, CHF, COPD, HTN, HLD o COPD: Dulera 100/5 mcg, Spiriva Handihaler 18 mcg daily; Proventil PRN; patient notes that she is interested in re-applying for patient assistance for 2021 - Dr. Patsey Berthold requested changing from Spiriva Handihaler to [place]; decided to apply for [employer] and Spiriva assistance - Of note, patient tested positive for COVID o HTN/CHF: HCTZ 12.5 mg daily, losartan 25 mg daily, metoprolol succinate 25 mg daily, furosemide 20 mg PRN; controlled at last office visit o HLD: rosuvastatin 20 mg daily; LDL at goal <70 o T2DM: metformin 1000 mg BID; A1c at goal <7%  Pharmacist Clinical Goal(s):  Marland Kitchen Over the next 90 days, patient will work with PharmD and provider towards optimized medication management  Interventions: . Received patient and provider portions of [employer]ck application for assistance for [employer] and Proventil and [employer] for [employer]. Will pass applications along to [employer], CPhT for submission and follow up. . Patient up to date on fill history for RAAS, statin, and DM medication.   Patient Self Care Activities:  . Patient will take medications as prescribed  Please see past updates related to this goal by clicking on the "Past Updates" button in the selected goal          Plan: - Will collaborate w/ patient, provider, and CPhT as above.  - Will outreach patient in the next 4-5 weeks for continued medication management support  [name], PharmD, [place], CPP Clinical Pharmacist [place] [phone]

## 2019-09-08 NOTE — Patient Instructions (Addendum)
Visit Information  Goals Addressed            This Visit's Progress     Patient Stated   . "I want to stay healthy" (pt-stated)       Current Barriers:  . Polypharmacy; complex patient with multiple comorbidities including T2DM, CHF, COPD, HTN, HLD o COPD: Dulera 100/5 mcg, Spiriva Handihaler 18 mcg daily; Proventil PRN; patient notes that she is interested in re-applying for patient assistance for 2021 - Dr. Patsey Berthold requested changing from Spiriva Handihaler to Spiriva Respimat; decided to apply for [employer] and Spiriva assistance o HTN/CHF: HCTZ 12.5 mg daily, losartan 25 mg daily, metoprolol 25 mg daily, furosemide 20 mg PRN  o HLD: rosuvastatin 20 mg daily  o T2DM: metformin 1000 mg BID  Pharmacist Clinical Goal(s):  Marland Kitchen Over the next 90 days, patient will work with PharmD and provider towards optimized medication management  Interventions: . Received patient and provider portions of [employer] application for assistance for [employer] and Proventil and [employer] for [employer]. Will pass applications along to [employer], CPhT for submission and follow up.  Patient Self Care Activities:  . Patient will take medications as prescribed  Please see past updates related to this goal by clicking on the "Past Updates" button in the selected goal         The patient verbalized understanding of instructions provided today and declined a print copy of patient instruction materials.    Plan: - Will collaborate w/ patient, provider, and CPhT as above.  - Will outreach patient in the next 4-5 weeks for continued medication management support  [name], PharmD, [place], CPP Clinical Pharmacist [place] [phone]

## 2019-09-09 ENCOUNTER — Telehealth: Payer: Self-pay | Admitting: Family Medicine

## 2019-09-09 NOTE — Telephone Encounter (Signed)
Fever 101.3, coughing , chills , short of breath with exertion, Patient taking doxycycline , taking tylenol for fever 650 mg every 12 hours,  Using Nyquil for cough and tussin Dm,.

## 2019-09-09 NOTE — Telephone Encounter (Signed)
Noted. Can you see if she has had to increase her O2 any? Has she been short of breath at rest?

## 2019-09-09 NOTE — Telephone Encounter (Signed)
Patient has not had to increase 02 at this time , and she is not SOB except on exertion

## 2019-09-09 NOTE — Telephone Encounter (Signed)
Patient was dx with COVID and states her current symptoms fever 101.3, coughing, chills. Patient states doxycycline (VIBRA-TABS) 100 MG tablet will make her feel better and then she feels worst. Best # [phone], patient seeking clinical advice   [place], [place].

## 2019-09-09 NOTE — Telephone Encounter (Signed)
Noted. Patient should monitor her symptoms. If she has worsening shortness of breath, shortness of breath at rest, or has to increase her O2 she needs to be evaluated in the ED.

## 2019-09-12 NOTE — Telephone Encounter (Signed)
Patient says she is completing her last ABX today but feels some better , still has no taste , no smell and still has a lot of head congestion, still coughing , but concerned that the ABX is not enough she will become sicker since she has completed the ABX. Advised patient antibiotics continue to work even after you complete therapy, patient stated I just don't want o go backwards. Patient main complaint is head congestion .

## 2019-09-12 NOTE — Telephone Encounter (Signed)
Patient is requesting a call back from nurse.  Patient states she does want some antibiotics. Call back [phone]

## 2019-09-12 NOTE — Telephone Encounter (Signed)
At this time I do not think there is a role for additional antibiotics.  It can take several weeks for people to start to feel better with the COVID-19 infection.  I would suggest she monitor her symptoms and if they worsen she should let us know.  If she develops symptoms in her chest with wheezing or shortness of breath she should be reevaluated.

## 2019-09-13 NOTE — Telephone Encounter (Signed)
Mucinex may be helpful for the congestion. She could also try adding flonase or claritin if not already on those.

## 2019-09-13 NOTE — Telephone Encounter (Signed)
Patient notified and voiced understanding to PCP recommendations, patient still says she feels better than she did but just still cannot taste anything and is very congested in her head. Patient ask anything for the head congestion and nasal stuffiness.

## 2019-09-14 ENCOUNTER — Ambulatory Visit: Payer: Self-pay | Admitting: Pharmacist

## 2019-09-14 ENCOUNTER — Other Ambulatory Visit: Payer: Self-pay | Admitting: Pharmacy Technician

## 2019-09-14 DIAGNOSIS — J9611 Chronic respiratory failure with hypoxia: Secondary | ICD-10-CM

## 2019-09-14 DIAGNOSIS — J432 Centrilobular emphysema: Secondary | ICD-10-CM

## 2019-09-14 NOTE — Telephone Encounter (Signed)
Was speaking with patient about patient assistance application, and gave her the below recommendations.   She already takes Flonase and a daily antihistamine, but she will try adding Mucinex. Encouraged continued hydration.

## 2019-09-14 NOTE — Patient Outreach (Signed)
[facility]) Care Management  09/14/2019  Martha Woodard 1966/03/22 [id number]   Care coordination call placed to BI in regards to patient's application for Spiriva Respimat.  Spoke to Mya who informed patient will be temporarily APPROVED on 09/30/2019. She informed patient needs to send in a denial LIS letter. She informed patient is eligible to receive a temporary supply of 3 months after 09/30/2019 if needed.  Will reach out to embedded [facility] pharmacist [name] for assistance.  [name] P. [place] Gottlieb, [place] Management [phone]

## 2019-09-14 NOTE — Patient Instructions (Signed)
Visit Information  Goals Addressed            This Visit's Progress     Patient Stated   . "I want to stay healthy" (pt-stated)       Current Barriers:  . Polypharmacy; complex patient with multiple comorbidities including T2DM, CHF, COPD, HTN, HLD o COPD: Dulera 100/5 mcg, Spiriva Handihaler 18 mcg daily; Proventil PRN; patient notes that she is interested in re-applying for patient assistance for 2021 - Applying for Spiriva assistance through [place] and Dulera/Proventil assistance through [employer] for 2021. o HTN/CHF: HCTZ 12.5 mg daily, losartan 25 mg daily, metoprolol succinate 25 mg daily, furosemide 20 mg PRN; controlled at last office visit o HLD: rosuvastatin 20 mg daily; LDL at goal <70 o T2DM: metformin 1000 mg BID; A1c at goal <7%  Pharmacist Clinical Goal(s):  Marland Kitchen Over the next 90 days, patient will work with PharmD and provider towards optimized medication management  Interventions: . Received message from Sharee Pimple Simcox that patient was temporarily approved, they will require proof of Medicare Extra Help/LIS Denial for continued supply. Per chart review, appears Deirdre Pippins, PharmD helped patient apply for this in 2019 and she was denied. Contacted patient to see if she still has this letter, left HIPAA compliant message for her to return my call at her convenience. Will collaborate w/ Susy Frizzle, CPhT to see if we have this letter on file w/ records at [facility] office.   Patient Self Care Activities:  . Patient will take medications as prescribed  Please see past updates related to this goal by clicking on the "Past Updates" button in the selected goal         The patient verbalized understanding of instructions provided today and declined a print copy of patient instruction materials.    Plan: - Will continue to collaborate w/ patient, provider, and CPhT as above - Will outreach patient as scheduled in ~6 weeks  [name], PharmD, [place], [place]  Pharmacist [place] [phone]

## 2019-09-14 NOTE — Chronic Care Management (AMB) (Signed)
Chronic Care Management   Follow Up Note   09/14/2019 Name: DAVETTA OLLIFF MRN: [id number] DOB: [birth year]  Referred by: Leone Haven, MD Reason for referral : Chronic Care Management (Medication Management)   ONETA SIGMAN is a 53 y.o. year old female who is a primary care patient of Caryl Bis, Angela Adam, MD. The CCM team was consulted for assistance with chronic disease management and care coordination needs.    Care coordination completed today.   Review of patient status, including review of consultants reports, relevant laboratory and other test results, and collaboration with appropriate care team members and the patient's provider was performed as part of comprehensive patient evaluation and provision of chronic care management services.    SDOH (Social Determinants of Health) screening performed today: Financial Strain . See Care Plan for related entries.   Outpatient Encounter Medications as of 09/14/2019  Medication Sig  . ACCU-CHEK SOFTCLIX LANCETS lancets TEST UP TO 4 TIMES A DAY  . albuterol (PROVENTIL) (2.5 MG/3ML) 0.083% nebulizer solution Take 3 mLs (2.5 mg total) by nebulization every 6 (six) hours as needed for wheezing or shortness of breath.  Marland Kitchen aspirin EC 81 MG tablet Take 1 tablet (81 mg total) by mouth daily.  . blood glucose meter kit and supplies Dispense based on patient and insurance preference. Use up to four times daily as directed. (FOR ICD-9 250.00, 250.01).  . calcium-vitamin D (OSCAL WITH D) 500-200 MG-UNIT tablet Take 1 tablet by mouth 2 (two) times daily.  Marland Kitchen doxycycline (VIBRA-TABS) 100 MG tablet Take 1 tablet (100 mg total) by mouth 2 (two) times daily.  . fluticasone (FLONASE) 50 MCG/ACT nasal spray USE 1 OR 2 SPRAYS IN EACH NOSTRIL ONCE DAILY.  . furosemide (LASIX) 20 MG tablet TAKE 1 TABLET BY MOUTH  DAILY AS NEEDED  . glucose blood (ACCU-CHEK AVIVA PLUS) test strip TEST UP TO 4 TIMES A DAY AS DIRECTED  . hydrochlorothiazide (MICROZIDE) 12.5  MG capsule TAKE 1 CAPSULE BY MOUTH  DAILY  . ketoconazole (NIZORAL) 2 % cream Apply 1 application topically daily.  Marland Kitchen ketoconazole (NIZORAL) 2 % shampoo Apply 1 application topically 2 (two) times a week.  . losartan (COZAAR) 25 MG tablet Take 1 tablet (25 mg total) by mouth 2 (two) times daily.  . metFORMIN (GLUCOPHAGE) 500 MG tablet TAKE 2 TABLETS BY MOUTH  TWICE DAILY WITH MEALS  . metoprolol succinate (TOPROL-XL) 25 MG 24 hr tablet TAKE 1 TABLET BY MOUTH  DAILY  . mometasone-formoterol (DULERA) 100-5 MCG/ACT AERO INHALE 2 PUFFS INTO LUNG THE LUNGS TWICEDAILY  . Multiple Vitamin (MULTIVITAMIN) tablet Take 1 tablet by mouth daily.  Marland Kitchen nystatin (MYCOSTATIN) 100000 UNIT/ML suspension Take 5 mLs (500,000 Units total) by mouth 4 (four) times daily. Swish and swallow  . nystatin cream (MYCOSTATIN) Apply 1 application topically 2 (two) times daily.  . ondansetron (ZOFRAN ODT) 4 MG disintegrating tablet Take 1 tablet (4 mg total) by mouth every 8 (eight) hours as needed for nausea or vomiting.  . OXYGEN Inhale 2 L into the lungs daily. 2 liters at night, 1 liter when doing activities, and no oxygen when at home during the day.  . potassium chloride (K-DUR) 10 MEQ tablet Take 1 tablet (10 mEq total) by mouth 2 (two) times daily.  Marland Kitchen PROVENTIL HFA 108 (90 Base) MCG/ACT inhaler Inhale 2 puffs into the lungs every 6 (six) hours as needed for wheezing or shortness of breath.  . rosuvastatin (CRESTOR) 20 MG tablet Take 1 tablet (20  mg total) by mouth daily.  Marland Kitchen tiotropium (SPIRIVA) 18 MCG inhalation capsule Place 1 capsule (18 mcg total) into inhaler and inhale daily.  . vitamin C (ASCORBIC ACID) 500 MG tablet Take 1,000 mg by mouth daily.   . Vitamin E 180 MG CAPS Take 1 capsule by mouth daily.   No facility-administered encounter medications on file as of 09/14/2019.     Goals Addressed            This Visit's Progress     Patient Stated   . "I want to stay healthy" (pt-stated)       Current  Barriers:  . Polypharmacy; complex patient with multiple comorbidities including T2DM, CHF, COPD, HTN, HLD o COPD: Dulera 100/5 mcg, Spiriva Handihaler 18 mcg daily; Proventil PRN; patient notes that she is interested in re-applying for patient assistance for 2021 - Applying for Spiriva assistance through [place] and Dulera/Proventil assistance through [employer] for 2021. - Dx w/ COVID.  o HTN/CHF: HCTZ 12.5 mg daily, losartan 25 mg daily, metoprolol succinate 25 mg daily, furosemide 20 mg PRN; controlled at last office visit o HLD: rosuvastatin 20 mg daily; LDL at goal <70 o T2DM: metformin 1000 mg BID; A1c at goal <7%  Pharmacist Clinical Goal(s):  Marland Kitchen Over the next 90 days, patient will work with PharmD and provider towards optimized medication management  Interventions: . Received message from Sharee Pimple Simcox that patient was temporarily approved, they will require proof of Medicare Extra Help/LIS Denial for continued supply. Contacted patient; she notes that she has a recent letter about Extra Help. She read it to me, and appears she actually has Partial Extra Help, however, this does not help with the cost of medications. Explained to patient that she is receiving a temporary 90 day supply of Spiriva Respimat, but when it comes time to fill at the pharmacy, we will need to demonstrate proof of what her copay is. If this is >3% of her total yearly income, BI will approve her to receive through the end of the year. . As patient has COVID, will circle back about providing [place] a copy of this letter when we talk next month. Will continue to collaborate w/ Susy Frizzle, CPhT on [employer] application  Patient Self Care Activities:  . Patient will take medications as prescribed  Please see past updates related to this goal by clicking on the "Past Updates" button in the selected goal          Plan: - Will continue to collaborate w/ patient, provider, and CPhT as above - Will outreach patient as scheduled in ~6  weeks  [name], PharmD, [place], [place] Pharmacist [place] [phone]

## 2019-09-15 ENCOUNTER — Other Ambulatory Visit: Payer: Self-pay | Admitting: Pharmacy Technician

## 2019-09-15 NOTE — Patient Outreach (Signed)
[facility]) Care Management  09/15/2019  Martha Woodard [birth year] [id number]     Successful call placed to patient regarding patient assistance application(s) for Spiriva Respimat , HIPAA identifiers verified.   Followup call placed to patient in regards to the phone call that embedded [facility] pharmacist [name] placed to patient yesterday. Reiterated that we will need a copy for the patient's extra help determination letter (that she informed embedded [facility] pharmacist  [name] as having yesterday) as well as the out of pocket cost for the Spiriva Respimat when she has it filled in January.  Also informed patient that she can place a refill request after 09/30/2019 for the Spiriva Respimat with BI and they will send her a temporary supply until she submits the required documentation. Confirmed patient had phone number to [facility] for the refill as well as for embedded [facility] pharmacist [name] and myself.  Follow up:  Will followup with patient in 15-20 business days after the holiday season and upcoming PAL time.  Naidelyn Parrella P. Ashana Tullo, [place] Management [phone]

## 2019-09-16 ENCOUNTER — Telehealth: Payer: Self-pay | Admitting: Family Medicine

## 2019-09-16 DIAGNOSIS — B370 Candidal stomatitis: Secondary | ICD-10-CM

## 2019-09-16 NOTE — Telephone Encounter (Signed)
Pt would like a refill on here nystatin (MYCOSTATIN) 100000 UNIT/ML suspension patient states she has thrush again in her mouth.  Collyn Ribas,cma

## 2019-09-16 NOTE — Telephone Encounter (Signed)
Pt would like a refill on here nystatin (MYCOSTATIN) 100000 UNIT/ML suspension.

## 2019-09-18 MED ORDER — NYSTATIN 100000 UNIT/ML MT SUSP: 5.0000 mL | Freq: Four times a day (QID) | OROMUCOSAL | 0 refills | Status: DC

## 2019-09-18 NOTE — Telephone Encounter (Signed)
Sent this to her pharmacy. If not improving or if it recurs she will need to complete a visit.

## 2019-09-19 ENCOUNTER — Encounter: Payer: Self-pay | Admitting: Family Medicine

## 2019-09-19 ENCOUNTER — Other Ambulatory Visit: Payer: Self-pay

## 2019-09-19 ENCOUNTER — Ambulatory Visit (INDEPENDENT_AMBULATORY_CARE_PROVIDER_SITE_OTHER): Payer: Medicare Other | Admitting: Family Medicine

## 2019-09-19 DIAGNOSIS — R0981 Nasal congestion: Secondary | ICD-10-CM

## 2019-09-19 MED ORDER — AMOXICILLIN 500 MG PO CAPS: 500.0000 mg | ORAL_CAPSULE | Freq: Three times a day (TID) | ORAL | 0 refills | Status: DC

## 2019-09-19 NOTE — Telephone Encounter (Signed)
I called and informed the patient that her medication was sent to the pharamcy.  Ajit Errico,cma

## 2019-09-19 NOTE — Assessment & Plan Note (Signed)
Symptoms are likely related to sinusitis.  She seems to have recovered well otherwise from her COVID-19 infection.  We will trial amoxicillin as she is able to tolerate this on its own.  Discussed that if she does not notice a difference in her symptoms in the next 3 to 4 days she should contact us.  If not improving by day 7 she will contact us as well.

## 2019-09-19 NOTE — Progress Notes (Signed)
Virtual Visit via telephone Note  This visit type was conducted due to national recommendations for restrictions regarding the COVID-19 pandemic (e.g. social distancing).  This format is felt to be most appropriate for this patient at this time.  All issues noted in this document were discussed and addressed.  No physical exam was performed (except for noted visual exam findings with Video Visits).   I connected with Martha Woodard today at  1:15 PM EST by telephone and verified that I am speaking with the correct person using two identifiers. Location patient: home Location provider: work Persons participating in the virtual visit: patient, provider  I discussed the limitations, risks, security and privacy concerns of performing an evaluation and management service by telephone and the availability of in person appointments. I also discussed with the patient that there may be a patient responsible charge related to this service. The patient expressed understanding and agreed to proceed.  Interactive audio and video telecommunications were attempted between this provider and patient, however failed, due to patient having technical difficulties OR patient did not have access to video capability.  We continued and completed visit with audio only.   Reason for visit: same day visit  HPI: Sinusitis: Patient notes this has progressively worsened.  She was diagnosed with COVID-19 on 09/02/2019.  She was subsequently treated for possible coinfection with a sinus infection with doxycycline.  She notes that did help while she was on doxycycline though her symptoms worsened after discontinuing the medication.  She notes she has congestion and pressure over her eyes and cheekbones.  She is not able to get anything out of her nose.  No cough or fever.  She reports she cannot tolerate amoxicillin on its own though has an allergy to Augmentin.   ROS: See pertinent positives and negatives per HPI.  Past  Medical History:  Diagnosis Date  . CHF (congestive heart failure) ([place])   . Colon polyp 01-01-2016  . COPD (chronic obstructive pulmonary disease) ([place])   . Diabetes mellitus without complication ([place])   . Diastolic heart failure ([place])   . HLD (hyperlipidemia)   . Hypertension   . Morbid obesity with BMI of 45.0-49.9, adult ([place])   . Multiple lung nodules on CT   . Shortness of breath dyspnea     Past Surgical History:  Procedure Laterality Date  . BIOPSY THYROID Right 02-06-14   NEGATIVE FOR MALIGNANT CELLS proteinaceous material and macrophages.  Marland Kitchen BREAST BIOPSY Left    neg- core  . COLONOSCOPY WITH PROPOFOL N/A 01/01/2016   Procedure: COLONOSCOPY WITH PROPOFOL;  Surgeon: Lucilla Lame, MD;  Location: [facility] ENDOSCOPY;  Service: Endoscopy;  Laterality: N/A;  . thryoid fna Right April 2015   Proteinaceous material and macrophages.  . WISDOM TOOTH EXTRACTION      Family History  Problem Relation Age of Onset  . Cancer Mother 73       uterian and ovarian   . Goiter Mother   . Stroke Father   . Breast cancer Sister 68  . Stroke Brother   . Breast cancer Sister 68  . Heart murmur Sister     SOCIAL HX: Former smoker   Current Outpatient Medications:  .  ACCU-CHEK SOFTCLIX LANCETS lancets, TEST UP TO 4 TIMES A DAY, Disp: 100 each, Rfl: 3 .  albuterol (PROVENTIL) (2.5 MG/3ML) 0.083% nebulizer solution, Take 3 mLs (2.5 mg total) by nebulization every 6 (six) hours as needed for wheezing or shortness of breath., Disp: 75 mL, Rfl:  12 .  aspirin EC 81 MG tablet, Take 1 tablet (81 mg total) by mouth daily., Disp: 90 tablet, Rfl: 3 .  blood glucose meter kit and supplies, Dispense based on patient and insurance preference. Use up to four times daily as directed. (FOR ICD-9 250.00, 250.01)., Disp: 1 each, Rfl: 0 .  calcium-vitamin D (OSCAL WITH D) 500-200 MG-UNIT tablet, Take 1 tablet by mouth 2 (two) times daily., Disp: , Rfl:  .  fluticasone (FLONASE) 50 MCG/ACT nasal spray, USE 1 OR 2  SPRAYS IN EACH NOSTRIL ONCE DAILY., Disp: 16 g, Rfl: 12 .  furosemide (LASIX) 20 MG tablet, TAKE 1 TABLET BY MOUTH  DAILY AS NEEDED, Disp: 90 tablet, Rfl: 0 .  glucose blood (ACCU-CHEK AVIVA PLUS) test strip, TEST UP TO 4 TIMES A DAY AS DIRECTED, Disp: 100 each, Rfl: 4 .  hydrochlorothiazide (MICROZIDE) 12.5 MG capsule, TAKE 1 CAPSULE BY MOUTH  DAILY, Disp: 90 capsule, Rfl: 3 .  ketoconazole (NIZORAL) 2 % cream, Apply 1 application topically daily., Disp: 15 g, Rfl: 0 .  ketoconazole (NIZORAL) 2 % shampoo, Apply 1 application topically 2 (two) times a week., Disp: 120 mL, Rfl: 0 .  losartan (COZAAR) 25 MG tablet, Take 1 tablet (25 mg total) by mouth 2 (two) times daily., Disp: 180 tablet, Rfl: 3 .  metFORMIN (GLUCOPHAGE) 500 MG tablet, TAKE 2 TABLETS BY MOUTH  TWICE DAILY WITH MEALS, Disp: 360 tablet, Rfl: 3 .  metoprolol succinate (TOPROL-XL) 25 MG 24 hr tablet, TAKE 1 TABLET BY MOUTH  DAILY, Disp: 90 tablet, Rfl: 3 .  mometasone-formoterol (DULERA) 100-5 MCG/ACT AERO, INHALE 2 PUFFS INTO LUNG THE LUNGS TWICEDAILY, Disp: 39 g, Rfl: 3 .  Multiple Vitamin (MULTIVITAMIN) tablet, Take 1 tablet by mouth daily., Disp: , Rfl:  .  nystatin (MYCOSTATIN) 100000 UNIT/ML suspension, Take 5 mLs (500,000 Units total) by mouth 4 (four) times daily. Swish and swallow, Disp: 120 mL, Rfl: 0 .  nystatin cream (MYCOSTATIN), Apply 1 application topically 2 (two) times daily., Disp: 30 g, Rfl: 0 .  ondansetron (ZOFRAN ODT) 4 MG disintegrating tablet, Take 1 tablet (4 mg total) by mouth every 8 (eight) hours as needed for nausea or vomiting., Disp: 20 tablet, Rfl: 0 .  OXYGEN, Inhale 2 L into the lungs daily. 2 liters at night, 1 liter when doing activities, and no oxygen when at home during the day., Disp: , Rfl:  .  potassium chloride (K-DUR) 10 MEQ tablet, Take 1 tablet (10 mEq total) by mouth 2 (two) times daily., Disp: 180 tablet, Rfl: 3 .  PROVENTIL HFA 108 (90 Base) MCG/ACT inhaler, Inhale 2 puffs into the lungs  every 6 (six) hours as needed for wheezing or shortness of breath., Disp: 3 Inhaler, Rfl: 1 .  rosuvastatin (CRESTOR) 20 MG tablet, Take 1 tablet (20 mg total) by mouth daily., Disp: 90 tablet, Rfl: 3 .  tiotropium (SPIRIVA) 18 MCG inhalation capsule, Place 1 capsule (18 mcg total) into inhaler and inhale daily., Disp: 90 capsule, Rfl: 3 .  vitamin C (ASCORBIC ACID) 500 MG tablet, Take 1,000 mg by mouth daily. , Disp: , Rfl:  .  Vitamin E 180 MG CAPS, Take 1 capsule by mouth daily., Disp: , Rfl:  .  amoxicillin (AMOXIL) 500 MG capsule, Take 1 capsule (500 mg total) by mouth 3 (three) times daily., Disp: 30 capsule, Rfl: 0  EXAM: This is a telehealth telephone visit and thus no physical exam was completed.  ASSESSMENT AND PLAN:  Discussed  the following assessment and plan:  Sinus congestion Symptoms are likely related to sinusitis.  She seems to have recovered well otherwise from her COVID-19 infection.  We will trial amoxicillin as she is able to tolerate this on its own.  Discussed that if she does not notice a difference in her symptoms in the next 3 to 4 days she should contact us.  If not improving by day 7 she will contact us as well.    I discussed the assessment and treatment plan with the patient. The patient was provided an opportunity to ask questions and all were answered. The patient agreed with the plan and demonstrated an understanding of the instructions.   The patient was advised to call back or seek an in-person evaluation if the symptoms worsen or if the condition fails to improve as anticipated.  I provided 12 minutes of non-face-to-face time during this encounter.   [name], MD

## 2019-09-24 ENCOUNTER — Other Ambulatory Visit: Payer: Self-pay | Admitting: Family Medicine

## 2019-09-24 ENCOUNTER — Other Ambulatory Visit: Payer: Self-pay | Admitting: Cardiovascular Disease

## 2019-09-28 ENCOUNTER — Telehealth: Payer: Self-pay | Admitting: Cardiovascular Disease

## 2019-09-28 NOTE — Telephone Encounter (Signed)

## 2019-09-29 ENCOUNTER — Other Ambulatory Visit: Payer: Self-pay | Admitting: Pharmacy Technician

## 2019-09-29 NOTE — Patient Outreach (Signed)
[facility]) Care Management  09/29/2019  Martha Woodard [birth year] [id number]   Care coordination call placed to [employer] in regards to patient's application for [facility] and Proventil.  Spoke to [name] who informed they have not received the application that was mailed to [employer] on 09/08/2019. [name] informed that it can take 2-3 weeks for them to receive the application and another 5 business days to upload it in the system. [name] suggested to try back late next week around the 8th of January 2021.  Will followup with [employer] in 5-10 business days to inquire about status of application.  [name] P. Alys Dulak, [place] Management [phone]

## 2019-10-01 DIAGNOSIS — J449 Chronic obstructive pulmonary disease, unspecified: Secondary | ICD-10-CM | POA: Diagnosis not present

## 2019-10-03 ENCOUNTER — Ambulatory Visit: Payer: Medicare Other | Admitting: Cardiovascular Disease

## 2019-10-07 ENCOUNTER — Other Ambulatory Visit: Payer: Self-pay | Admitting: Pharmacy Technician

## 2019-10-07 NOTE — Patient Outreach (Signed)
[place] [facility]) Care Management  10/07/2019  MURIELLE MARSICANO 29-Aug-1966 [id number]   Care coordination call placed to [employer] in regards to patient's application for [facility] and Proventil.  Spoke to [place] who informed they have not received the application that was mailed to them on December 10. She informed they were closed from Dec 23-27, 31 and Jan 1-3. She informed that it can take up to 30 days for them to receive and process an application with added days for holidays.  Will follow up with [employer] in 10-14 business days  To inquire if application was received.  Dario Yono P. Kelcy Baeten, [place] Management [phone]

## 2019-10-10 ENCOUNTER — Ambulatory Visit (INDEPENDENT_AMBULATORY_CARE_PROVIDER_SITE_OTHER): Payer: Medicare Other | Admitting: Pharmacist

## 2019-10-10 DIAGNOSIS — J432 Centrilobular emphysema: Secondary | ICD-10-CM

## 2019-10-10 MED ORDER — SPIRIVA RESPIMAT 2.5 MCG/ACT IN AERS: 2.5000 ug | INHALATION_SPRAY | Freq: Every day | RESPIRATORY_TRACT | 2 refills | Status: DC

## 2019-10-10 NOTE — Patient Instructions (Signed)
Visit Information  Goals Addressed            This Visit's Progress     Patient Stated   . "I want to stay healthy" (pt-stated)       Current Barriers:  . Polypharmacy; complex patient with multiple comorbidities including T2DM, CHF, COPD, HTN, HLD o COPD: Dulera 100/5 mcg, Spiriva Respimat 2.5 mcg daily, Proventil PRN - Collaborating w/ Sharee Pimple Simcox, CPhT on reapplications. Patient temporarily approved for Spiriva assistance through [facility]. We applied for Medicare Extra Help and she was granted Partial Extra Help. For continued supply, patient will need to submit proof of denial for Full Extra Help, including proof of copay for [place] application for [employer] mailed 12/10; per last phone call to [employer], they had not yet received/processed application - Patient saw Dr. Caryl Bis for sinus infection 09/19/19, given course of amoxicillin. She notes improvement in congestion after this, but still requiring daily OTC mucinex, and feels like her sinuses are still swollen. Wonders about needing a course of prednisone.  o HTN/CHF: HCTZ 12.5 mg daily, losartan 25 mg daily, metoprolol succinate 25 mg daily, furosemide 20 mg PRN; controlled at last office visit o HLD: rosuvastatin 20 mg daily; LDL at goal <70 o T2DM: metformin 1000 mg BID; A1c at goal <7%  Pharmacist Clinical Goal(s):  Marland Kitchen Over the next 90 days, patient will work with PharmD and provider towards optimized medication management  Interventions: . Reviewed need for proof of copay for Spiriva to submit to [facility] for continued supply of medication. Will send prescription to [facility] today. Patient will go by later this week, pick up paperwork showing copay, and will drop off at clinic for me. She will also leave copy of Partial Medicare Extra Help award letter. Once received, I will pass to [facility], CPhT to submit to [facility] . Patient will be on the look out for [employer] w/ attestation letter.  . Reviewed upcoming appointment  w/ Dr. Rockey Situ . Discussed patient's symptoms w/ Dr. Caryl Bis; he recommended a f/u virtual appt w/ him. Collaborating w/ office staff to outreach patient and schedule this.   Patient Self Care Activities:  . Patient will take medications as prescribed  Please see past updates related to this goal by clicking on the "Past Updates" button in the selected goal         The patient verbalized understanding of instructions provided today and declined a print copy of patient instruction materials.   Plan: - Scheduled f/u call 11/14/19 at 10 am  [name], PharmD, [place], [place] [phone]

## 2019-10-10 NOTE — Chronic Care Management (AMB) (Signed)
Chronic Care Management   Follow Up Note   10/10/2019 Name: Martha Woodard MRN: [id number] DOB: 08/04/1966  Referred by: Leone Haven, MD Reason for referral : Chronic Care Management (Medication Management)   Martha Woodard is a 54 y.o. year old female who is a primary care patient of Caryl Bis, Angela Adam, MD. The CCM team was consulted for assistance with chronic disease management and care coordination needs.    Contacted patient for medication management review today.   Review of patient status, including review of consultants reports, relevant laboratory and other test results, and collaboration with appropriate care team members and the patient's provider was performed as part of comprehensive patient evaluation and provision of chronic care management services.    SDOH (Social Determinants of Health) screening performed today: Financial Strain . See Care Plan for related entries.   Outpatient Encounter Medications as of 10/10/2019  Medication Sig  . fluticasone (FLONASE) 50 MCG/ACT nasal spray USE 1 OR 2 SPRAYS IN EACH NOSTRIL ONCE DAILY.  . mometasone-formoterol (DULERA) 100-5 MCG/ACT AERO INHALE 2 PUFFS INTO LUNG THE LUNGS TWICEDAILY  . ACCU-CHEK SOFTCLIX LANCETS lancets TEST UP TO 4 TIMES A DAY  . albuterol (PROVENTIL) (2.5 MG/3ML) 0.083% nebulizer solution Take 3 mLs (2.5 mg total) by nebulization every 6 (six) hours as needed for wheezing or shortness of breath.  Marland Kitchen amoxicillin (AMOXIL) 500 MG capsule Take 1 capsule (500 mg total) by mouth 3 (three) times daily.  Marland Kitchen aspirin EC 81 MG tablet Take 1 tablet (81 mg total) by mouth daily.  . blood glucose meter kit and supplies Dispense based on patient and insurance preference. Use up to four times daily as directed. (FOR ICD-9 250.00, 250.01).  . calcium-vitamin D (OSCAL WITH D) 500-200 MG-UNIT tablet Take 1 tablet by mouth 2 (two) times daily.  . furosemide (LASIX) 20 MG tablet TAKE 1 TABLET BY MOUTH  DAILY AS NEEDED  .  glucose blood (ACCU-CHEK AVIVA PLUS) test strip TEST UP TO 4 TIMES A DAY AS DIRECTED  . hydrochlorothiazide (MICROZIDE) 12.5 MG capsule TAKE 1 CAPSULE BY MOUTH  DAILY  . ketoconazole (NIZORAL) 2 % cream Apply 1 application topically daily.  Marland Kitchen ketoconazole (NIZORAL) 2 % shampoo Apply 1 application topically 2 (two) times a week.  . losartan (COZAAR) 25 MG tablet TAKE 1 TABLET BY MOUTH TWO  TIMES DAILY  . metFORMIN (GLUCOPHAGE) 500 MG tablet TAKE 2 TABLETS BY MOUTH  TWICE DAILY WITH MEALS  . metoprolol succinate (TOPROL-XL) 25 MG 24 hr tablet TAKE 1 TABLET BY MOUTH  DAILY  . Multiple Vitamin (MULTIVITAMIN) tablet Take 1 tablet by mouth daily.  Marland Kitchen nystatin (MYCOSTATIN) 100000 UNIT/ML suspension Take 5 mLs (500,000 Units total) by mouth 4 (four) times daily. Swish and swallow  . nystatin cream (MYCOSTATIN) Apply 1 application topically 2 (two) times daily.  . ondansetron (ZOFRAN ODT) 4 MG disintegrating tablet Take 1 tablet (4 mg total) by mouth every 8 (eight) hours as needed for nausea or vomiting.  . OXYGEN Inhale 2 L into the lungs daily. 2 liters at night, 1 liter when doing activities, and no oxygen when at home during the day.  . potassium chloride (K-DUR) 10 MEQ tablet Take 1 tablet (10 mEq total) by mouth 2 (two) times daily.  . potassium chloride (KLOR-CON) 10 MEQ tablet TAKE 1 TABLET BY MOUTH TWO  TIMES DAILY  . PROVENTIL HFA 108 (90 Base) MCG/ACT inhaler Inhale 2 puffs into the lungs every 6 (six) hours as needed  for wheezing or shortness of breath.  . rosuvastatin (CRESTOR) 20 MG tablet TAKE 1 TABLET BY MOUTH  DAILY  . vitamin C (ASCORBIC ACID) 500 MG tablet Take 1,000 mg by mouth daily.   . Vitamin E 180 MG CAPS Take 1 capsule by mouth daily.  . [DISCONTINUED] tiotropium (SPIRIVA) 18 MCG inhalation capsule Place 1 capsule (18 mcg total) into inhaler and inhale daily.   No facility-administered encounter medications on file as of 10/10/2019.     Goals Addressed            This  Visit's Progress     Patient Stated   . "I want to stay healthy" (pt-stated)       Current Barriers:  . Polypharmacy; complex patient with multiple comorbidities including T2DM, CHF, COPD, HTN, HLD o COPD: Dulera 100/5 mcg, Spiriva Respimat 2.5 mcg daily, Proventil PRN - Collaborating w/ Sharee Pimple Simcox, CPhT on reapplications. Patient temporarily approved for Spiriva assistance through [facility]. We applied for Medicare Extra Help and she was granted Partial Extra Help. For continued supply, patient will need to submit proof of denial for Full Extra Help, including proof of copay for [place] application for [employer] mailed 12/10; per last phone call to [employer], they had not yet received/processed application - Patient saw Dr. Caryl Bis for sinus infection 09/19/19, given course of amoxicillin. She notes improvement in congestion after this, but still requiring daily OTC mucinex, and feels like her sinuses are still swollen. Wonders about needing a course of prednisone.  o HTN/CHF: HCTZ 12.5 mg daily, losartan 25 mg daily, metoprolol succinate 25 mg daily, furosemide 20 mg PRN; controlled at last office visit o HLD: rosuvastatin 20 mg daily; LDL at goal <70 o T2DM: metformin 1000 mg BID; A1c at goal <7%  Pharmacist Clinical Goal(s):  Marland Kitchen Over the next 90 days, patient will work with PharmD and provider towards optimized medication management  Interventions: . Reviewed need for proof of copay for Spiriva to submit to [facility] for continued supply of medication. Will send prescription to [facility] today. Patient will go by later this week, pick up paperwork showing copay, and will drop off at clinic for me. She will also leave copy of Partial Medicare Extra Help award letter. Once received, I will pass to [facility], CPhT to submit to [facility] . Patient will be on the look out for [employer] w/ attestation letter.  . Reviewed upcoming appointment w/ Dr. Rockey Situ . Discussed patient's symptoms w/ Dr.  Caryl Bis; he recommended a f/u virtual appt w/ him. Collaborating w/ office staff to outreach patient and schedule this.   Patient Self Care Activities:  . Patient will take medications as prescribed  Please see past updates related to this goal by clicking on the "Past Updates" button in the selected goal          Plan: - Scheduled f/u call 11/14/19 at 10 am  [name], PharmD, [place], [place] [phone]

## 2019-10-13 ENCOUNTER — Other Ambulatory Visit: Payer: Self-pay | Admitting: Pharmacy Technician

## 2019-10-13 NOTE — Patient Outreach (Signed)
[facility]) Care Management  10/13/2019  Martha Woodard [birth year] [id number]  Received patient's LIS letter for partial subsidy approval as well as her proof of copay for Spiriva Respimat.  Submitted information into BI.   Will followup with BI in 3-7 business days to inquire if approval will be extended.  Uri Covey P. Brighten Orndoff, [place] Management [phone]

## 2019-10-17 ENCOUNTER — Ambulatory Visit (INDEPENDENT_AMBULATORY_CARE_PROVIDER_SITE_OTHER): Payer: Medicare Other | Admitting: Family Medicine

## 2019-10-17 ENCOUNTER — Encounter: Payer: Self-pay | Admitting: Family Medicine

## 2019-10-17 ENCOUNTER — Other Ambulatory Visit: Payer: Self-pay

## 2019-10-17 DIAGNOSIS — R0981 Nasal congestion: Secondary | ICD-10-CM

## 2019-10-17 MED ORDER — PREDNISONE 20 MG PO TABS: 40.0000 mg | ORAL_TABLET | Freq: Every day | ORAL | 0 refills | Status: DC

## 2019-10-17 NOTE — Progress Notes (Signed)
Virtual Visit via telephone note  This visit type was conducted due to national recommendations for restrictions regarding the COVID-19 pandemic (e.g. social distancing).  This format is felt to be most appropriate for this patient at this time.  All issues noted in this document were discussed and addressed.  No physical exam was performed (except for noted visual exam findings with Video Visits).   I connected with Martha Woodard today at 11:30 AM EST by telephone and verified that I am speaking with the correct person using two identifiers. Location patient: home Location provider: work or home office Persons participating in the virtual visit: patient, provider  I discussed the limitations, risks, security and privacy concerns of performing an evaluation and management service by telephone and the availability of in person appointments. I also discussed with the patient that there may be a patient responsible charge related to this service. The patient expressed understanding and agreed to proceed.  Interactive audio and video telecommunications were attempted between this provider and patient, however failed, due to patient having technical difficulties OR patient did not have access to video capability.  We continued and completed visit with audio only.  Too long Reason for visit: same day  HPI: Congestion: Patient was previously treated with doxycycline and then amoxicillin and 2 separate courses.  She was diagnosed with COVID-19 though thought to also potentially have secondary bacterial sinusitis.  Overall she feels better though she still has significant congestion in her sinuses and has trouble breathing out of her nose.  She feels swollen in her sinuses.  No fevers.  She is blowing yellow mucus out of her nose.  Coughing up clear mucus.  She is using Flonase daily and a second-generation antihistamine.  She also tried Mucinex and a decongestant and those helped though once they wore off  the congestion came back.  CBGs have been in the low 100s.   ROS: See pertinent positives and negatives per HPI.  Past Medical History:  Diagnosis Date  . CHF (congestive heart failure) ([place])   . Colon polyp 01-01-2016  . COPD (chronic obstructive pulmonary disease) ([place])   . Diabetes mellitus without complication ([place])   . Diastolic heart failure ([place])   . HLD (hyperlipidemia)   . Hypertension   . Morbid obesity with BMI of 45.0-49.9, adult ([place])   . Multiple lung nodules on CT   . Shortness of breath dyspnea     Past Surgical History:  Procedure Laterality Date  . BIOPSY THYROID Right 02-06-14   NEGATIVE FOR MALIGNANT CELLS proteinaceous material and macrophages.  Marland Kitchen BREAST BIOPSY Left    neg- core  . COLONOSCOPY WITH PROPOFOL N/A 01/01/2016   Procedure: COLONOSCOPY WITH PROPOFOL;  Surgeon: Lucilla Lame, MD;  Location: [facility] ENDOSCOPY;  Service: Endoscopy;  Laterality: N/A;  . thryoid fna Right April 2015   Proteinaceous material and macrophages.  . WISDOM TOOTH EXTRACTION      Family History  Problem Relation Age of Onset  . Cancer Mother 30       uterian and ovarian   . Goiter Mother   . Stroke Father   . Breast cancer Sister 46  . Stroke Brother   . Breast cancer Sister 6  . Heart murmur Sister     SOCIAL HX: Former smoker   Current Outpatient Medications:  .  ACCU-CHEK SOFTCLIX LANCETS lancets, TEST UP TO 4 TIMES A DAY, Disp: 100 each, Rfl: 3 .  albuterol (PROVENTIL) (2.5 MG/3ML) 0.083% nebulizer solution, Take 3  mLs (2.5 mg total) by nebulization every 6 (six) hours as needed for wheezing or shortness of breath., Disp: 75 mL, Rfl: 12 .  aspirin EC 81 MG tablet, Take 1 tablet (81 mg total) by mouth daily., Disp: 90 tablet, Rfl: 3 .  blood glucose meter kit and supplies, Dispense based on patient and insurance preference. Use up to four times daily as directed. (FOR ICD-9 250.00, 250.01)., Disp: 1 each, Rfl: 0 .  calcium-vitamin D (OSCAL WITH D) 500-200 MG-UNIT  tablet, Take 1 tablet by mouth 2 (two) times daily., Disp: , Rfl:  .  fluticasone (FLONASE) 50 MCG/ACT nasal spray, USE 1 OR 2 SPRAYS IN EACH NOSTRIL ONCE DAILY., Disp: 16 g, Rfl: 12 .  furosemide (LASIX) 20 MG tablet, TAKE 1 TABLET BY MOUTH  DAILY AS NEEDED, Disp: 90 tablet, Rfl: 0 .  glucose blood (ACCU-CHEK AVIVA PLUS) test strip, TEST UP TO 4 TIMES A DAY AS DIRECTED, Disp: 100 each, Rfl: 4 .  hydrochlorothiazide (MICROZIDE) 12.5 MG capsule, TAKE 1 CAPSULE BY MOUTH  DAILY, Disp: 90 capsule, Rfl: 3 .  ketoconazole (NIZORAL) 2 % cream, Apply 1 application topically daily., Disp: 15 g, Rfl: 0 .  ketoconazole (NIZORAL) 2 % shampoo, Apply 1 application topically 2 (two) times a week., Disp: 120 mL, Rfl: 0 .  losartan (COZAAR) 25 MG tablet, TAKE 1 TABLET BY MOUTH TWO  TIMES DAILY, Disp: 180 tablet, Rfl: 0 .  metFORMIN (GLUCOPHAGE) 500 MG tablet, TAKE 2 TABLETS BY MOUTH  TWICE DAILY WITH MEALS, Disp: 360 tablet, Rfl: 3 .  metoprolol succinate (TOPROL-XL) 25 MG 24 hr tablet, TAKE 1 TABLET BY MOUTH  DAILY, Disp: 90 tablet, Rfl: 3 .  mometasone-formoterol (DULERA) 100-5 MCG/ACT AERO, INHALE 2 PUFFS INTO LUNG THE LUNGS TWICEDAILY, Disp: 39 g, Rfl: 3 .  Multiple Vitamin (MULTIVITAMIN) tablet, Take 1 tablet by mouth daily., Disp: , Rfl:  .  nystatin (MYCOSTATIN) 100000 UNIT/ML suspension, Take 5 mLs (500,000 Units total) by mouth 4 (four) times daily. Swish and swallow, Disp: 120 mL, Rfl: 0 .  nystatin cream (MYCOSTATIN), Apply 1 application topically 2 (two) times daily., Disp: 30 g, Rfl: 0 .  ondansetron (ZOFRAN ODT) 4 MG disintegrating tablet, Take 1 tablet (4 mg total) by mouth every 8 (eight) hours as needed for nausea or vomiting., Disp: 20 tablet, Rfl: 0 .  OXYGEN, Inhale 2 L into the lungs daily. 2 liters at night, 1 liter when doing activities, and no oxygen when at home during the day., Disp: , Rfl:  .  potassium chloride (K-DUR) 10 MEQ tablet, Take 1 tablet (10 mEq total) by mouth 2 (two) times  daily., Disp: 180 tablet, Rfl: 3 .  potassium chloride (KLOR-CON) 10 MEQ tablet, TAKE 1 TABLET BY MOUTH TWO  TIMES DAILY, Disp: 180 tablet, Rfl: 0 .  PROVENTIL HFA 108 (90 Base) MCG/ACT inhaler, Inhale 2 puffs into the lungs every 6 (six) hours as needed for wheezing or shortness of breath., Disp: 3 Inhaler, Rfl: 1 .  rosuvastatin (CRESTOR) 20 MG tablet, TAKE 1 TABLET BY MOUTH  DAILY, Disp: 90 tablet, Rfl: 3 .  Tiotropium Bromide Monohydrate (SPIRIVA RESPIMAT) 2.5 MCG/ACT AERS, Inhale 2.5 mcg into the lungs daily., Disp: 4 g, Rfl: 2 .  vitamin C (ASCORBIC ACID) 500 MG tablet, Take 1,000 mg by mouth daily. , Disp: , Rfl:  .  Vitamin E 180 MG CAPS, Take 1 capsule by mouth daily., Disp: , Rfl:  .  predniSONE (DELTASONE) 20 MG tablet, Take  2 tablets (40 mg total) by mouth daily with breakfast., Disp: 10 tablet, Rfl: 0  EXAM: This is a telehealth telephone visit and thus no physical exam was completed.  ASSESSMENT AND PLAN:  Discussed the following assessment and plan:  Sinus congestion Overall feeling better though still does have significant sinus congestion.  I do not think she has a sinus infection at this time and I do not believe she needs antibiotics currently.  We will trial prednisone to see if that provides benefit.  Discussed if it is not improving over the next week or so we could try adding Levaquin to see if there is residual sinus infection.   No orders of the defined types were placed in this encounter.   Meds ordered this encounter  Medications  . predniSONE (DELTASONE) 20 MG tablet    Sig: Take 2 tablets (40 mg total) by mouth daily with breakfast.    Dispense:  10 tablet    Refill:  0     I discussed the assessment and treatment plan with the patient. The patient was provided an opportunity to ask questions and all were answered. The patient agreed with the plan and demonstrated an understanding of the instructions.   The patient was advised to call back or seek an  in-person evaluation if the symptoms worsen or if the condition fails to improve as anticipated.  I provided 9 minutes of non-face-to-face time during this encounter.   [name], MD

## 2019-10-18 NOTE — Progress Notes (Signed)
Virtual Visit via Video Note   This visit type was conducted due to national recommendations for restrictions regarding the COVID-19 Pandemic (e.g. social distancing) in an effort to limit this patient's exposure and mitigate transmission in our community.  Due to her co-morbid illnesses, this patient is at least at moderate risk for complications without adequate follow up.  This format is felt to be most appropriate for this patient at this time.  All issues noted in this document were discussed and addressed.  A limited physical exam was performed with this format.  Please refer to the patient's chart for her consent to telehealth for [facility].   I connected with  [name] on 10/19/19 by a video enabled telemedicine application and verified that I am speaking with the correct person using two identifiers. I discussed the limitations of evaluation and management by telemedicine. The patient expressed understanding and agreed to proceed.   Evaluation Performed:  Follow-up visit  Date:  10/19/2019   ID:  Martha Woodard, Martha Woodard [birth year], MRN [id number]  Patient Location:  [place] [address]   Provider location:   [facility], [place] office  PCP:  Leone Haven, MD  Cardiologist:  Arvid Right [facility]  Chief Complaint  Patient presents with  . other    12 month follow up. Meds reviewed by the pt. verbally. "doing well."      History of Present Illness:    Martha Woodard is a 54 y.o. female who presents via Engineer, civil (consulting) for a telehealth visit today.   The patient does not symptoms concerning for COVID-19 infection (fever, chills, cough, or new SHORTNESS OF BREATH).   Patient has a past medical history of diabetes,  asthma,  COPD, Quit smoking 2014, smoked for >30 yrs lung nodules  hospital for acute respiratory distress, 20 pound weight gain, hypoxia, diagnosed with COPD exacerbation and acute diastolic CHF  Chronic  LE edema>L obesity hypoventilation Ejection fraction 60% in December 2016 She presents for follow-up of her diastolic CHF  Last seen by myself in clinic November 2019  COVID-19 + December 2020 Sore throat, coughing, some shortness of breath, had to use oxygen at times. Felt like bronchiits Sinus infection has persisted No fever currently PMD called prednisone, may need abx  Takes Lasix 20 mg daily with potassium x 2 On losartan, metoprolol Crestor with potassium/Lasix  Having fluttering post covid  On last clinic, had lost 30 pounds  Labs reviewed HBA1C 6.4 Total chol 97,LDL 32 CR 0.58  Continues to use oxygen when leaves house  stopped smoking 06/2013  Other past medical history reviewed Prior episode of hypoxic respiratory distress Previous CT chest  Bilateral lower lobe atelectasis, pulmonary edema versus interstitial pneumonitis superimposed on changes of COPD and right lung nodules   initiated on steroids, antibiotics, inhalation therapy as well as diuretics.    hypoxic intermittently, especially at nighttime and sitting in the bed, concerning for obesity hypoventilation syndrome.      Prior CV studies:   The following studies were reviewed today:    Past Medical History:  Diagnosis Date  . CHF (congestive heart failure) ([place])   . Colon polyp 01-01-2016  . COPD (chronic obstructive pulmonary disease) ([place])   . Diabetes mellitus without complication ([place])   . Diastolic heart failure ([place])   . HLD (hyperlipidemia)   . Hypertension   . Morbid obesity with BMI of 45.0-49.9, adult ([place])   . Multiple  lung nodules on CT   . Shortness of breath dyspnea    Past Surgical History:  Procedure Laterality Date  . BIOPSY THYROID Right 02-06-14   NEGATIVE FOR MALIGNANT CELLS proteinaceous material and macrophages.  Marland Kitchen BREAST BIOPSY Left    neg- core  . COLONOSCOPY WITH PROPOFOL N/A 01/01/2016   Procedure: COLONOSCOPY WITH PROPOFOL;  Surgeon: Lucilla Lame, MD;   Location: [facility] ENDOSCOPY;  Service: Endoscopy;  Laterality: N/A;  . thryoid fna Right April 2015   Proteinaceous material and macrophages.  . WISDOM TOOTH EXTRACTION        Allergies:   Augmentin [amoxicillin-pot clavulanate]   Social History   Tobacco Use  . Smoking status: Former Smoker    Packs/day: 3.00    Years: 33.00    Pack years: 99.00    Quit date: 07/10/2013    Years since quitting: 6.2  . Smokeless tobacco: Never Used  Substance Use Topics  . Alcohol use: No    Alcohol/week: 0.0 standard drinks  . Drug use: No     Current Outpatient Medications on File Prior to Visit  Medication Sig Dispense Refill  . ACCU-CHEK SOFTCLIX LANCETS lancets TEST UP TO 4 TIMES A DAY 100 each 3  . albuterol (PROVENTIL) (2.5 MG/3ML) 0.083% nebulizer solution Take 3 mLs (2.5 mg total) by nebulization every 6 (six) hours as needed for wheezing or shortness of breath. 75 mL 12  . aspirin EC 81 MG tablet Take 1 tablet (81 mg total) by mouth daily. 90 tablet 3  . blood glucose meter kit and supplies Dispense based on patient and insurance preference. Use up to four times daily as directed. (FOR ICD-9 250.00, 250.01). 1 each 0  . calcium-vitamin D (OSCAL WITH D) 500-200 MG-UNIT tablet Take 1 tablet by mouth 2 (two) times daily.    . fluticasone (FLONASE) 50 MCG/ACT nasal spray USE 1 OR 2 SPRAYS IN EACH NOSTRIL ONCE DAILY. 16 g 12  . furosemide (LASIX) 20 MG tablet TAKE 1 TABLET BY MOUTH  DAILY AS NEEDED 90 tablet 0  . glucose blood (ACCU-CHEK AVIVA PLUS) test strip TEST UP TO 4 TIMES A DAY AS DIRECTED 100 each 4  . hydrochlorothiazide (MICROZIDE) 12.5 MG capsule TAKE 1 CAPSULE BY MOUTH  DAILY 90 capsule 3  . ketoconazole (NIZORAL) 2 % cream Apply 1 application topically daily. 15 g 0  . ketoconazole (NIZORAL) 2 % shampoo Apply 1 application topically 2 (two) times a week. 120 mL 0  . losartan (COZAAR) 25 MG tablet TAKE 1 TABLET BY MOUTH TWO  TIMES DAILY 180 tablet 0  . metFORMIN (GLUCOPHAGE) 500  MG tablet TAKE 2 TABLETS BY MOUTH  TWICE DAILY WITH MEALS 360 tablet 3  . metoprolol succinate (TOPROL-XL) 25 MG 24 hr tablet TAKE 1 TABLET BY MOUTH  DAILY 90 tablet 3  . mometasone-formoterol (DULERA) 100-5 MCG/ACT AERO INHALE 2 PUFFS INTO LUNG THE LUNGS TWICEDAILY 39 g 3  . Multiple Vitamin (MULTIVITAMIN) tablet Take 1 tablet by mouth daily.    Marland Kitchen nystatin (MYCOSTATIN) 100000 UNIT/ML suspension Take 5 mLs (500,000 Units total) by mouth 4 (four) times daily. Swish and swallow 120 mL 0  . nystatin cream (MYCOSTATIN) Apply 1 application topically 2 (two) times daily. 30 g 0  . ondansetron (ZOFRAN ODT) 4 MG disintegrating tablet Take 1 tablet (4 mg total) by mouth every 8 (eight) hours as needed for nausea or vomiting. 20 tablet 0  . OXYGEN Inhale 2 L into the lungs daily. 2 liters at  night, 1 liter when doing activities, and no oxygen when at home during the day.    . potassium chloride (K-DUR) 10 MEQ tablet Take 1 tablet (10 mEq total) by mouth 2 (two) times daily. 180 tablet 3  . predniSONE (DELTASONE) 20 MG tablet Take 2 tablets (40 mg total) by mouth daily with breakfast. 10 tablet 0  . PROVENTIL HFA 108 (90 Base) MCG/ACT inhaler Inhale 2 puffs into the lungs every 6 (six) hours as needed for wheezing or shortness of breath. 3 Inhaler 1  . rosuvastatin (CRESTOR) 20 MG tablet TAKE 1 TABLET BY MOUTH  DAILY 90 tablet 3  . Tiotropium Bromide Monohydrate (SPIRIVA RESPIMAT) 2.5 MCG/ACT AERS Inhale 2.5 mcg into the lungs daily. 4 g 2  . vitamin C (ASCORBIC ACID) 500 MG tablet Take 1,000 mg by mouth daily.     . Vitamin E 180 MG CAPS Take 1 capsule by mouth daily.     No current facility-administered medications on file prior to visit.     Family Hx: The patient's family history includes Breast cancer (age of onset: 24) in her sister; Breast cancer (age of onset: 32) in her sister; Cancer (age of onset: 24) in her mother; Goiter in her mother; Heart murmur in her sister; Stroke in her brother and  father.  ROS:   Please see the history of present illness.    Review of Systems  Constitutional: Negative.   HENT: Negative.   Respiratory: Positive for shortness of breath.   Cardiovascular: Positive for palpitations.  Gastrointestinal: Negative.   Musculoskeletal: Negative.   Neurological: Negative.   Psychiatric/Behavioral: Negative.   All other systems reviewed and are negative.    Labs/Other Tests and Data Reviewed:    Recent Labs: 12/07/2018: Hemoglobin 12.3; Platelets 158.0 06/29/2019: ALT 33; BUN 7; Creatinine, Ser 0.58; Potassium 3.7; Sodium 140   Recent Lipid Panel Lab Results  Component Value Date/Time   CHOL 97 06/29/2019 08:56 AM   TRIG 109.0 06/29/2019 08:56 AM   HDL 43.40 06/29/2019 08:56 AM   CHOLHDL 2 06/29/2019 08:56 AM   LDLCALC 32 06/29/2019 08:56 AM   LDLDIRECT 57.0 07/27/2018 09:20 AM    Wt Readings from Last 3 Encounters:  10/19/19 246 lb (111.6 kg)  10/17/19 247 lb (112 kg)  09/19/19 246 lb (111.6 kg)     Exam:    Vital Signs: Vital signs may also be detailed in the HPI Ht _0  (1.651 m)   Wt 246 lb (111.6 kg)   BMI 40.94 kg/m   Wt Readings from Last 3 Encounters:  10/19/19 246 lb (111.6 kg)  10/17/19 247 lb (112 kg)  09/19/19 246 lb (111.6 kg)   Temp Readings from Last 3 Encounters:  08/17/19 98 F (36.7 C) (Temporal)  12/07/18 98.5 F (36.9 C) (Oral)  12/03/18 100.3 F (37.9 C) (Oral)   BP Readings from Last 3 Encounters:  08/17/19 124/82  12/07/18 124/72  12/03/18 129/70   Pulse Readings from Last 3 Encounters:  08/17/19 96  12/07/18 88  12/03/18 88     Well nourished, well developed female in no acute distress. Constitutional:  oriented to person, place, and time. No distress.  Head: Normocephalic and atraumatic.  Eyes:  no discharge. No scleral icterus.  Neck: Normal range of motion. Neck supple.  Pulmonary/Chest: No audible wheezing, no distress, appears comfortable Musculoskeletal: Normal range of motion.  no   tenderness or deformity.  Neurological:   Coordination normal. Full exam not performed Skin:  No  rash Psychiatric:  normal mood and affect. behavior is normal. Thought content normal.    ASSESSMENT & PLAN:    Problem List Items Addressed This Visit      Cardiology Problems   Essential hypertension   Hyperlipidemia   Venous insufficiency   Chronic diastolic heart failure ([place]) - Primary     Other   DM type 2 (diabetes mellitus, type 2) (HCC)   COPD (chronic obstructive pulmonary disease) with emphysema (HCC)   Obesity hypoventilation syndrome ([place])     Covid:  recovered, Residual sinus infection  COPD: Stopped smoking On oxygen  Obesity hypoventilation: We have encouraged continued exercise, careful diet management in an effort to lose weight.  Diastolic CHF On lasix daily, potassium x 2 Reports she is euvolemic No changes  Diabetes:  HBA1C 6.4   COVID-19 Education: The signs and symptoms of COVID-19 were discussed with the patient and how to seek care for testing (follow up with PCP or arrange E-visit).  The importance of social distancing was discussed today.  Patient Risk:   After full review of this patients clinical status, I feel that they are at least moderate risk at this time.  Time:   Today, I have spent 25 minutes with the patient with telehealth technology discussing the cardiac and medical problems/diagnoses detailed above   Additional 10 min spent reviewing the chart prior to patient visit today   Medication Adjustments/Labs and Tests Ordered: Current medicines are reviewed at length with the patient today.  Concerns regarding medicines are outlined above.   Tests Ordered: No tests ordered   Medication Changes: No changes made   Disposition: Follow-up in 12 months   Signed, Ida Rogue, MD  [place] Office [address] [place] [address], [place], [place] [address]

## 2019-10-19 ENCOUNTER — Other Ambulatory Visit: Payer: Self-pay

## 2019-10-19 ENCOUNTER — Encounter: Payer: Self-pay | Admitting: Cardiovascular Disease

## 2019-10-19 ENCOUNTER — Telehealth (INDEPENDENT_AMBULATORY_CARE_PROVIDER_SITE_OTHER): Payer: Medicare Other | Admitting: Cardiovascular Disease

## 2019-10-19 VITALS — Ht 65.0 in | Wt 246.0 lb

## 2019-10-19 DIAGNOSIS — U071 COVID-19: Secondary | ICD-10-CM

## 2019-10-19 DIAGNOSIS — I5032 Chronic diastolic (congestive) heart failure: Secondary | ICD-10-CM

## 2019-10-19 DIAGNOSIS — J432 Centrilobular emphysema: Secondary | ICD-10-CM | POA: Diagnosis not present

## 2019-10-19 DIAGNOSIS — E1159 Type 2 diabetes mellitus with other circulatory complications: Secondary | ICD-10-CM | POA: Diagnosis not present

## 2019-10-19 DIAGNOSIS — I872 Venous insufficiency (chronic) (peripheral): Secondary | ICD-10-CM

## 2019-10-19 DIAGNOSIS — E662 Morbid (severe) obesity with alveolar hypoventilation: Secondary | ICD-10-CM

## 2019-10-19 DIAGNOSIS — J9612 Chronic respiratory failure with hypercapnia: Secondary | ICD-10-CM

## 2019-10-19 DIAGNOSIS — E782 Mixed hyperlipidemia: Secondary | ICD-10-CM

## 2019-10-19 DIAGNOSIS — I10 Essential (primary) hypertension: Secondary | ICD-10-CM

## 2019-10-19 DIAGNOSIS — J9611 Chronic respiratory failure with hypoxia: Secondary | ICD-10-CM

## 2019-10-19 NOTE — Assessment & Plan Note (Signed)
Overall feeling better though still does have significant sinus congestion.  I do not think she has a sinus infection at this time and I do not believe she needs antibiotics currently.  We will trial prednisone to see if that provides benefit.  Discussed if it is not improving over the next week or so we could try adding Levaquin to see if there is residual sinus infection.

## 2019-10-19 NOTE — Patient Instructions (Signed)

## 2019-10-20 ENCOUNTER — Ambulatory Visit: Payer: Medicare Other

## 2019-10-21 ENCOUNTER — Telehealth: Payer: Self-pay | Admitting: Family Medicine

## 2019-10-21 MED ORDER — LEVOFLOXACIN 500 MG PO TABS: 500.0000 mg | ORAL_TABLET | Freq: Every day | ORAL | 0 refills | Status: DC

## 2019-10-21 NOTE — Telephone Encounter (Signed)
Noted.  Levaquin sent to pharmacy.  Please let her know this.  Please let her know there is long-term risk with this medication of tendon rupture and neuropathy as well as other neurological issues that can occur even after she comes off of this medication.  I want her to be aware of this while she is taking it.

## 2019-10-21 NOTE — Telephone Encounter (Signed)
Pt had a virtual on 10/17/2019 with Dr. Caryl Bis. She still has a stuffy head and wanted to let him know that she needs that antibiotic. Please let patient know if Dr. Caryl Bis does call in medication to her pharmacy.

## 2019-10-21 NOTE — Telephone Encounter (Signed)
Pt had a virtual on 10/17/2019 with Dr. Caryl Bis. She still has a stuffy head and wanted to let him know that she needs that antibiotic. Please let patient know if Dr. Caryl Bis does call in medication to her pharmacy. Winn Muehl,cma

## 2019-10-21 NOTE — Telephone Encounter (Signed)
Patient returned a call back and I informed her that Levaquin was sent to her pharmacy and I explained some of the side effects per the provider for her to know about, she understood.  Martha Woodard,cma

## 2019-10-21 NOTE — Telephone Encounter (Signed)
Lmtcb to inform her that her medication Levaquin was sent to the pharmacy.  And to explain medication side effects.  Veldon Wager,cma

## 2019-10-24 ENCOUNTER — Other Ambulatory Visit: Payer: Self-pay | Admitting: Pharmacy Technician

## 2019-10-24 ENCOUNTER — Ambulatory Visit: Payer: Self-pay | Admitting: Pharmacist

## 2019-10-24 DIAGNOSIS — J432 Centrilobular emphysema: Secondary | ICD-10-CM

## 2019-10-24 LAB — HM DIABETES EYE EXAM

## 2019-10-24 NOTE — Patient Instructions (Signed)
Visit Information  Goals Addressed            This Visit's Progress     Patient Stated   . "I want to stay healthy" (pt-stated)       Current Barriers:  . Polypharmacy; complex patient with multiple comorbidities including T2DM, CHF, COPD, HTN, HLD o COPD: Dulera 100/5 mcg, Spiriva Respimat 2.5 mcg daily, Proventil PRN - Patient calls today to report she received a letter from [employer] noting that she is denied for Spiriva assistance d/t having LIS - Patient also reports receiving paperwork + attestation letter from [employer], and is unsure what to do with it. o HTN/CHF: HCTZ 12.5 mg daily, losartan 25 mg daily, metoprolol succinate 25 mg daily, furosemide 20 mg PRN; controlled at last office visit o HLD: rosuvastatin 20 mg daily; LDL at goal <70 o T2DM: metformin 1000 mg BID; A1c at goal <7%  Pharmacist Clinical Goal(s):  Marland Kitchen Over the next 90 days, patient will work with PharmD and provider towards optimized medication management  Interventions: . Patient has partial LIS, not full, so should qualify for Spiriva assistance. Will collaborate w/ CPhT to [employer] to discuss application.  . Reviewed Attestation letter with patient and how to complete, and mail back with full original application to [employer]. She verbalized understanding  Patient Self Care Activities:  . Patient will take medications as prescribed  Please see past updates related to this goal by clicking on the "Past Updates" button in the selected goal         The patient verbalized understanding of instructions provided today and declined a print copy of patient instruction materials.   Plan:  - Will collaborate w/ CPhT as above  [name], PharmD, [place], [place] Pharmacist [place] [phone]

## 2019-10-24 NOTE — Patient Outreach (Signed)
[facility]) Care Management  10/24/2019  Martha Woodard Apr 27, 1966 [id number]   Care coordination call placed to [employer] in regards to patient's application for Spiriva Respimat.  Spoke to [place] who informed they received the LIS letter and proof of co pay but they were unable to read the amount she paid for the inhaler. Verdene Lennert advised to have that information re faxed. Inquired if the pharmacist could hand write the price and initial it as the price on the receipt is highlighted and there is no way to change the receipt. She informed that would be acceptable.  Will lighten and refax the receipt and fax back in to [facility] when back in the office this week. Will follow up with [employer] in 3-5 business days after submitting the information this week.  Shandel Busic P. Nicolus Ose, [place] Management [phone]

## 2019-10-24 NOTE — Chronic Care Management (AMB) (Signed)
Chronic Care Management   Follow Up Note   10/24/2019 Name: Martha Woodard MRN: [id number] DOB: 01/26/1966  Referred by: Leone Haven, MD Reason for referral : Chronic Care Management (Medication Management)   Martha Woodard is a 54 y.o. year old female who is a primary care patient of Caryl Bis, Angela Adam, MD. The CCM team was consulted for assistance with chronic disease management and care coordination needs.    Received call from patient w/ medication management questions.   Review of patient status, including review of consultants reports, relevant laboratory and other test results, and collaboration with appropriate care team members and the patient's provider was performed as part of comprehensive patient evaluation and provision of chronic care management services.    SDOH (Social Determinants of Health) screening performed today: Financial Strain . See Care Plan for related entries.   Outpatient Encounter Medications as of 10/24/2019  Medication Sig  . ACCU-CHEK SOFTCLIX LANCETS lancets TEST UP TO 4 TIMES A DAY  . albuterol (PROVENTIL) (2.5 MG/3ML) 0.083% nebulizer solution Take 3 mLs (2.5 mg total) by nebulization every 6 (six) hours as needed for wheezing or shortness of breath.  Marland Kitchen aspirin EC 81 MG tablet Take 1 tablet (81 mg total) by mouth daily.  . blood glucose meter kit and supplies Dispense based on patient and insurance preference. Use up to four times daily as directed. (FOR ICD-9 250.00, 250.01).  . calcium-vitamin D (OSCAL WITH D) 500-200 MG-UNIT tablet Take 1 tablet by mouth 2 (two) times daily.  . fluticasone (FLONASE) 50 MCG/ACT nasal spray USE 1 OR 2 SPRAYS IN EACH NOSTRIL ONCE DAILY.  . furosemide (LASIX) 20 MG tablet TAKE 1 TABLET BY MOUTH  DAILY AS NEEDED  . glucose blood (ACCU-CHEK AVIVA PLUS) test strip TEST UP TO 4 TIMES A DAY AS DIRECTED  . hydrochlorothiazide (MICROZIDE) 12.5 MG capsule TAKE 1 CAPSULE BY MOUTH  DAILY  . ketoconazole (NIZORAL) 2 %  cream Apply 1 application topically daily.  Marland Kitchen ketoconazole (NIZORAL) 2 % shampoo Apply 1 application topically 2 (two) times a week.  Marland Kitchen levofloxacin (LEVAQUIN) 500 MG tablet Take 1 tablet (500 mg total) by mouth daily.  Marland Kitchen losartan (COZAAR) 25 MG tablet TAKE 1 TABLET BY MOUTH TWO  TIMES DAILY  . metFORMIN (GLUCOPHAGE) 500 MG tablet TAKE 2 TABLETS BY MOUTH  TWICE DAILY WITH MEALS  . metoprolol succinate (TOPROL-XL) 25 MG 24 hr tablet TAKE 1 TABLET BY MOUTH  DAILY  . mometasone-formoterol (DULERA) 100-5 MCG/ACT AERO INHALE 2 PUFFS INTO LUNG THE LUNGS TWICEDAILY  . Multiple Vitamin (MULTIVITAMIN) tablet Take 1 tablet by mouth daily.  Marland Kitchen nystatin (MYCOSTATIN) 100000 UNIT/ML suspension Take 5 mLs (500,000 Units total) by mouth 4 (four) times daily. Swish and swallow  . nystatin cream (MYCOSTATIN) Apply 1 application topically 2 (two) times daily.  . ondansetron (ZOFRAN ODT) 4 MG disintegrating tablet Take 1 tablet (4 mg total) by mouth every 8 (eight) hours as needed for nausea or vomiting.  . OXYGEN Inhale 2 L into the lungs daily. 2 liters at night, 1 liter when doing activities, and no oxygen when at home during the day.  . potassium chloride (K-DUR) 10 MEQ tablet Take 1 tablet (10 mEq total) by mouth 2 (two) times daily.  . predniSONE (DELTASONE) 20 MG tablet Take 2 tablets (40 mg total) by mouth daily with breakfast.  . PROVENTIL HFA 108 (90 Base) MCG/ACT inhaler Inhale 2 puffs into the lungs every 6 (six) hours as needed for  wheezing or shortness of breath.  . rosuvastatin (CRESTOR) 20 MG tablet TAKE 1 TABLET BY MOUTH  DAILY  . Tiotropium Bromide Monohydrate (SPIRIVA RESPIMAT) 2.5 MCG/ACT AERS Inhale 2.5 mcg into the lungs daily.  . vitamin C (ASCORBIC ACID) 500 MG tablet Take 1,000 mg by mouth daily.   . Vitamin E 180 MG CAPS Take 1 capsule by mouth daily.   No facility-administered encounter medications on file as of 10/24/2019.     Objective:   Goals Addressed            This Visit's  Progress     Patient Stated   . "I want to stay healthy" (pt-stated)       Current Barriers:  . Polypharmacy; complex patient with multiple comorbidities including T2DM, CHF, COPD, HTN, HLD o COPD: Dulera 100/5 mcg, Spiriva Respimat 2.5 mcg daily, Proventil PRN - Patient calls today to report she received a letter from [employer] noting that she is denied for Spiriva assistance d/t having LIS - Patient also reports receiving paperwork + attestation letter from [employer], and is unsure what to do with it. o HTN/CHF: HCTZ 12.5 mg daily, losartan 25 mg daily, metoprolol succinate 25 mg daily, furosemide 20 mg PRN; controlled at last office visit o HLD: rosuvastatin 20 mg daily; LDL at goal <70 o T2DM: metformin 1000 mg BID; A1c at goal <7%  Pharmacist Clinical Goal(s):  Marland Kitchen Over the next 90 days, patient will work with PharmD and provider towards optimized medication management  Interventions: . Patient has partial LIS, not full, so should qualify for Spiriva assistance. Will collaborate w/ CPhT to [employer] to discuss application.  . Reviewed Attestation letter with patient and how to complete, and mail back with full original application to [employer]. She verbalized understanding  Patient Self Care Activities:  . Patient will take medications as prescribed  Please see past updates related to this goal by clicking on the "Past Updates" button in the selected goal          Plan:  - Will collaborate w/ CPhT as above  [name], PharmD, [place], [place] Pharmacist [place] [phone]

## 2019-10-26 ENCOUNTER — Other Ambulatory Visit: Payer: Self-pay | Admitting: Pharmacy Technician

## 2019-10-26 NOTE — Patient Outreach (Signed)
[place] [facility]) Care Management  10/26/2019  Martha Woodard May 27, 1966 [id number]   Care coordination call placed to [employer] in regards to patient's application for  [facility] and Proventil.  Spoke to [place] who informed they received the patient's application. She informed they mailed the attestation and original application back to the patient on 10/14/2019. Once the attestation form is received back then they can continue to process the application.  Received the following in basket message from embedded [place] on 10/24/2019: "Also notes she received [employer], I talked to her about how to complete and mail everything back, so she should do that in the next few days."  Will follow up with [employer] in 15-20 business days to inquire if they have received the form back and mailed out the medications.  Ademide Schaberg P. [address], [place] Management [phone]

## 2019-10-31 ENCOUNTER — Other Ambulatory Visit: Payer: Self-pay | Admitting: Pharmacy Technician

## 2019-10-31 NOTE — Patient Outreach (Signed)
[facility] [facility]) Care Management  10/31/2019  Martha Woodard 1966/02/16 [id number]   Care coordination call placed to [employer] in regards to Spiriva application.  Spoke to [place] who informed patient was denied due to having LIS. Informed Sheran that we faxed in her proof of co-pay last week and was told they were unable to read the price and therefore we re-faxed it to [facility]. Anice Paganini was able to locate the fax. She informed she would link it to patient's file and send it back for processing. She informed they would fax either an approval or denial letter to the provider's office or we could call back in a few days.  Will follow up with [employer] in 3-7 business days to inquire if a determination has been made.  Thurlow Gallaga P. Holbert Caples, [place] Management [phone]

## 2019-11-01 DIAGNOSIS — J449 Chronic obstructive pulmonary disease, unspecified: Secondary | ICD-10-CM | POA: Diagnosis not present

## 2019-11-02 ENCOUNTER — Other Ambulatory Visit: Payer: Self-pay | Admitting: Pharmacy Technician

## 2019-11-02 NOTE — Patient Outreach (Signed)
[facility] [facility]) Care Management  11/02/2019  HEMI DEESE 05-31-1966 [id number]  Care coordination call placed to [employer] in regards to patient's application for [facility] and Proventil HFA.  Spoke to [place] who informed they have not received back the patient's attestation form that she completed and mailed back to them around 10/24/2019.  Will follow up with [employer] in 10-14 business days.  Phylliss Strege P. Treavor Blomquist, [place] Management [phone]

## 2019-11-07 ENCOUNTER — Other Ambulatory Visit: Payer: Self-pay | Admitting: Pharmacy Technician

## 2019-11-07 NOTE — Patient Outreach (Signed)
[place] [facility]) Care Management  11/07/2019  Martha Woodard 06-04-1966 [id number]  Care coordination call placed to [employer] in regards to patient's application for Spiriva Respimat.  Spoke to [place] who informed patient was APPROVED 10/31/2019-09/28/2020. She informed a shipment would be going out this week. She informed it would arrive at patient's home in 5-7 business days via [place].  Will follow up with patient in 7-10 business days to confirm medication was received.  Avenir Lozinski P. Gaige Sebo, [place] Management [phone]

## 2019-11-14 ENCOUNTER — Ambulatory Visit (INDEPENDENT_AMBULATORY_CARE_PROVIDER_SITE_OTHER): Payer: Medicare Other | Admitting: Pharmacist

## 2019-11-14 DIAGNOSIS — E1159 Type 2 diabetes mellitus with other circulatory complications: Secondary | ICD-10-CM | POA: Diagnosis not present

## 2019-11-14 DIAGNOSIS — I10 Essential (primary) hypertension: Secondary | ICD-10-CM

## 2019-11-14 DIAGNOSIS — J432 Centrilobular emphysema: Secondary | ICD-10-CM | POA: Diagnosis not present

## 2019-11-14 DIAGNOSIS — B370 Candidal stomatitis: Secondary | ICD-10-CM

## 2019-11-14 NOTE — Chronic Care Management (AMB) (Signed)
Chronic Care Management   Follow Up Note   11/14/2019 Name: Martha Woodard MRN: [id number] DOB: 1966-01-05  Referred by: Martha Haven, MD Reason for referral : Chronic Care Management (Medication Management)   Martha Woodard is a 54 y.o. year old female who is a primary care patient of Martha Woodard, Martha Adam, MD. The CCM team was consulted for assistance with chronic disease management and care coordination needs.   Contacted patient for medication management review as scheduled.    Review of patient status, including review of consultants reports, relevant laboratory and other test results, and collaboration with appropriate care team members and the patient's provider was performed as part of comprehensive patient evaluation and provision of chronic care management services.    SDOH (Social Determinants of Health) screening performed today: Financial Strain . See Care Plan for related entries.   Outpatient Encounter Medications as of 11/14/2019  Medication Sig Note  . ACCU-CHEK SOFTCLIX LANCETS lancets TEST UP TO 4 TIMES A DAY   . aspirin EC 81 MG tablet Take 1 tablet (81 mg total) by mouth daily.   . blood glucose meter kit and supplies Dispense based on patient and insurance preference. Use up to four times daily as directed. (FOR ICD-9 250.00, 250.01).   . calcium-vitamin D (OSCAL WITH D) 500-200 MG-UNIT tablet Take 1 tablet by mouth 2 (two) times daily.   . cetirizine (ZYRTEC) 10 MG tablet Take 10 mg by mouth daily.   . fluticasone (FLONASE) 50 MCG/ACT nasal spray USE 1 OR 2 SPRAYS IN EACH NOSTRIL ONCE DAILY.   . furosemide (LASIX) 20 MG tablet TAKE 1 TABLET BY MOUTH  DAILY AS NEEDED 11/14/2019: Taking every day  . glucose blood (ACCU-CHEK AVIVA PLUS) test strip TEST UP TO 4 TIMES A DAY AS DIRECTED   . hydrochlorothiazide (MICROZIDE) 12.5 MG capsule TAKE 1 CAPSULE BY MOUTH  DAILY   . ketoconazole (NIZORAL) 2 % shampoo Apply 1 application topically 2 (two) times a week.   .  losartan (COZAAR) 25 MG tablet TAKE 1 TABLET BY MOUTH TWO  TIMES DAILY   . metFORMIN (GLUCOPHAGE) 500 MG tablet TAKE 2 TABLETS BY MOUTH  TWICE DAILY WITH MEALS   . metoprolol succinate (TOPROL-XL) 25 MG 24 hr tablet TAKE 1 TABLET BY MOUTH  DAILY   . mometasone-formoterol (DULERA) 100-5 MCG/ACT AERO INHALE 2 PUFFS INTO LUNG THE LUNGS TWICEDAILY   . Multiple Vitamin (MULTIVITAMIN) tablet Take 1 tablet by mouth daily.   Marland Kitchen nystatin (MYCOSTATIN) 100000 UNIT/ML suspension Take 5 mLs (500,000 Units total) by mouth 4 (four) times daily. Swish and swallow   . nystatin cream (MYCOSTATIN) Apply 1 application topically 2 (two) times daily.   . OXYGEN Inhale 2 L into the lungs daily. 2 liters at night, 1 liter when doing activities, and no oxygen when at home during the day.   . potassium chloride (K-DUR) 10 MEQ tablet Take 1 tablet (10 mEq total) by mouth 2 (two) times daily.   Marland Kitchen PROVENTIL HFA 108 (90 Base) MCG/ACT inhaler Inhale 2 puffs into the lungs every 6 (six) hours as needed for wheezing or shortness of breath.   . rosuvastatin (CRESTOR) 20 MG tablet TAKE 1 TABLET BY MOUTH  DAILY   . Tiotropium Bromide Monohydrate (SPIRIVA RESPIMAT) 2.5 MCG/ACT AERS Inhale 2.5 mcg into the lungs daily.   . vitamin C (ASCORBIC ACID) 500 MG tablet Take 1,000 mg by mouth daily.    . Vitamin E 180 MG CAPS Take 1 capsule  by mouth daily.   Marland Kitchen albuterol (PROVENTIL) (2.5 MG/3ML) 0.083% nebulizer solution Take 3 mLs (2.5 mg total) by nebulization every 6 (six) hours as needed for wheezing or shortness of breath. (Patient not taking: Reported on 11/14/2019)   . ketoconazole (NIZORAL) 2 % cream Apply 1 application topically daily. (Patient not taking: Reported on 11/14/2019)   . ondansetron (ZOFRAN ODT) 4 MG disintegrating tablet Take 1 tablet (4 mg total) by mouth every 8 (eight) hours as needed for nausea or vomiting. (Patient not taking: Reported on 11/14/2019)   . [DISCONTINUED] levofloxacin (LEVAQUIN) 500 MG tablet Take 1 tablet  (500 mg total) by mouth daily.   . [DISCONTINUED] predniSONE (DELTASONE) 20 MG tablet Take 2 tablets (40 mg total) by mouth daily with breakfast.    No facility-administered encounter medications on file as of 11/14/2019.     Objective:   Goals Addressed            This Visit's Progress     Patient Stated   . "I want to stay healthy" (pt-stated)       Current Barriers:  . Polypharmacy; complex patient with multiple comorbidities including T2DM, CHF, COPD, HTN, HLD o COPD: Dulera 100/5 mcg, Spiriva Respimat 2.5 mcg daily, Proventil PRN - Patient APPROVED for Spiriva Handihaler assistance through [place] approval for Dulera/Proventil through [employer]. Patient mailed attestation form back on 10/24/19; as of last call by CPhT on 11/02/19, this had not been processed yet. Patient notes she has ~1 [facility] inhaler remaining.  o HTN/CHF: HCTZ 12.5 mg daily, losartan 25 mg daily, metoprolol succinate 25 mg daily, furosemide 20 mg PRN; controlled at last office visit. Does NOT have a BP cuff at home o HLD: rosuvastatin 20 mg daily; LDL at goal <70 o T2DM: metformin 1000 mg BID; A1c at goal <7% o Topicals: nystatin suspension- patient reports that even with rinsing her mouth out after [facility] administration, she still will occasionally have "itching" that is resolved w/ nystatin mouthwash. Ketoconazole shampoo - patient reports that her scalp sometimes breaks out, and has done so more frequently since her recent COVID illness and sinus infection. Patient requests refills on both nystatin mouthwash and ketoconazole shampoo  Pharmacist Clinical Goal(s):  Marland Kitchen Over the next 90 days, patient will work with PharmD and provider towards optimized medication management  Interventions: . Comprehensive medication review performed, medication list updated in electronic medical record.  . Reviewed current patient assistance program approval status. Will update CPhT as to patient's current supply of Dulera. If  needed, patient will let me know and we can send a refill to local pharmacy while awaiting [employer] approval . Patient is aware of OTC benefits available through [facility]; notes she has $40/quarter that she generally spends on vitamins. She will review the catalog for availability of BP machines and considering purchasing one. Reviewed the importance of being able to review BP at home, given antihypertensives and her CV disease. . Will let Dr. Caryl Woodard know of patient's request for refils on ketoconazole shampoo and nystatin mouth wash . Patient does not have f/u with Dr. Caryl Woodard scheduled. Last comprehensive labwork drawn in September, likely follow up in March is appropriate. Will collaborate w/ Dr. Caryl Woodard to see when he would like to see her back regarding chronic medical conditions, and then collaborate w/ office staff to outreach patient for scheduling.  Patient Self Care Activities:  . Patient will take medications as prescribed  Please see past updates related to this goal by clicking on the "  Past Updates" button in the selected goal          Plan:  -Scheduled f/u call 01/16/20  [name], PharmD, BCACP, [place] Pharmacist [place] [phone]

## 2019-11-14 NOTE — Patient Instructions (Signed)
Visit Information  Goals Addressed            This Visit's Progress     Patient Stated   . "I want to stay healthy" (pt-stated)       Current Barriers:  . Polypharmacy; complex patient with multiple comorbidities including T2DM, CHF, COPD, HTN, HLD o COPD: Dulera 100/5 mcg, Spiriva Respimat 2.5 mcg daily, Proventil PRN - Patient APPROVED for Spiriva Handihaler assistance through [place] approval for Dulera/Proventil through [employer]. Patient mailed attestation form back on 10/24/19; as of last call by CPhT on 11/02/19, this had not been processed yet. Patient notes she has ~1 [facility] inhaler remaining.  o HTN/CHF: HCTZ 12.5 mg daily, losartan 25 mg daily, metoprolol succinate 25 mg daily, furosemide 20 mg PRN; controlled at last office visit. Does NOT have a BP cuff at home o HLD: rosuvastatin 20 mg daily; LDL at goal <70 o T2DM: metformin 1000 mg BID; A1c at goal <7% o Topicals: nystatin suspension- patient reports that even with rinsing her mouth out after [facility] administration, she still will occasionally have "itching" that is resolved w/ nystatin mouthwash. Ketoconazole shampoo - patient reports that her scalp sometimes breaks out, and has done so more frequently since her recent COVID illness and sinus infection. Patient requests refills on both nystatin mouthwash and ketoconazole shampoo  Pharmacist Clinical Goal(s):  Marland Kitchen Over the next 90 days, patient will work with PharmD and provider towards optimized medication management  Interventions: . Comprehensive medication review performed, medication list updated in electronic medical record.  . Reviewed current patient assistance program approval status. Will update CPhT as to patient's current supply of Dulera. If needed, patient will let me know and we can send a refill to local pharmacy while awaiting [employer] approval . Patient is aware of OTC benefits available through [facility]; notes she has $40/quarter that she generally spends on  vitamins. She will review the catalog for availability of BP machines and considering purchasing one. Reviewed the importance of being able to review BP at home, given antihypertensives and her CV disease. . Will let Dr. Caryl Bis know of patient's request for refils on ketoconazole shampoo and nystatin mouth wash . Patient does not have f/u with Dr. Caryl Bis scheduled. Last comprehensive labwork drawn in September, likely follow up in March is appropriate. Will collaborate w/ Dr. Caryl Bis to see when he would like to see her back regarding chronic medical conditions, and then collaborate w/ office staff to outreach patient for scheduling.  Patient Self Care Activities:  . Patient will take medications as prescribed  Please see past updates related to this goal by clicking on the "Past Updates" button in the selected goal         The patient verbalized understanding of instructions provided today and declined a print copy of patient instruction materials.   Plan:  -Scheduled f/u call 01/16/20  [name], PharmD, BCACP, CPP Clinical Pharmacist [place] [phone]

## 2019-11-17 MED ORDER — KETOCONAZOLE 2 % EX SHAM: 1.0000 "application " | MEDICATED_SHAMPOO | CUTANEOUS | 0 refills | Status: DC

## 2019-11-17 MED ORDER — NYSTATIN 100000 UNIT/ML MT SUSP: 5.0000 mL | Freq: Four times a day (QID) | OROMUCOSAL | 0 refills | Status: DC

## 2019-11-17 NOTE — Addendum Note (Signed)
Addended by: Caryl Bis, Hulda Reddix G on: 11/17/2019 01:10 PM   Modules accepted: Orders

## 2019-11-17 NOTE — Progress Notes (Signed)
I have reviewed the above note and agree. I was available to the pharmacist for consultation. I have refilled the medications requested. Please confirm why she needs the nystatin refilled. If she is having recurrent issues with thrush she needs to complete an in office visit for evaluation.   [name], MD

## 2019-11-18 ENCOUNTER — Other Ambulatory Visit: Payer: Self-pay | Admitting: Pharmacy Technician

## 2019-11-18 ENCOUNTER — Ambulatory Visit: Payer: Self-pay | Admitting: Pharmacist

## 2019-11-18 NOTE — Chronic Care Management (AMB) (Signed)
  Chronic Care Management   Note  11/18/2019 Name: Martha Woodard MRN: [id number] DOB: 1965-12-26  [name] is a 54 y.o. year old female who is a primary care patient of Caryl Bis, Angela Adam, MD. The CCM team was consulted for assistance with chronic disease management and care coordination needs.    As follow up from last visit: Upon refilling nystatin mouth wash, Dr. Caryl Bis noted that if patient is having recurrent thrush, she needs an in office appointment for evaluation. Contacted patient, left message noting this recommendation from PCP, and to call me back with questions.   [name], PharmD, [place], CPP Clinical Pharmacist [place] [phone]

## 2019-11-18 NOTE — Patient Outreach (Signed)
[place] [facility]) Care Management  11/18/2019  Martha Woodard 1966/09/15 [id number]  ADDENDUM   Successful call placed to patient regarding patient assistance medication delivery of Dulera and Proventil with [employer] and Spiriva with [employer], HIPAA identifiers verified.   Patient informed she received her initial shipment of the 3 Spiriva inhalers. Informed patient that her enrollment with [employer] had been extended until years end. Informed patient to continue to call in her refills as she has done in previous years and that the phone number has not changed. Patient verbalized understanding.  Patient also informed that she has received 3 Dulera and Proventil HFA inhalers as well. Informed patient that [employer] has changed to a new pharmacy called [facility]. Provided patient the phone number to [facility] for when she needs a refill. Also reminded patient that the supply of Dulera may or may not be available when she calls in for a refill as [employer] is spinning off the manufacturing of Dulera to another unnamed company at this time. Informed patient to outreach myself or embedded [place] and then we would coordinate with Dr. Patsey Berthold at Pulmonary for other options. Patient verbalized understanding. Confirmed patient had name and number as she had no other questions or concerns.  Follow up:  Will route note to embedded [place] for case closure  And will remove myself from care team.  Luiz Ochoa. Martha Woodard, [place] Management [phone]

## 2019-11-18 NOTE — Patient Outreach (Signed)
[place] [facility]) Care Management  11/18/2019  BRIN HAUPTMAN 02/07/1966 [id number]  Care coordination call placed to [employer] in regards to patient's application for [facility] and Proventil HFA.  Spoke to [place] who informed patient was APPROVED 11/04/2019-09/28/2020. She informed that 3 inhalers of each medication was delivered to the patient on 11/15/2019. The tracking number is [id number].  Will outreach patient to inquire if she has received the medication.  Muranda Coye P. Jaasia Viglione, [place] Management [phone]

## 2019-11-29 DIAGNOSIS — J449 Chronic obstructive pulmonary disease, unspecified: Secondary | ICD-10-CM | POA: Diagnosis not present

## 2019-12-30 DIAGNOSIS — J449 Chronic obstructive pulmonary disease, unspecified: Secondary | ICD-10-CM | POA: Diagnosis not present

## 2020-01-04 ENCOUNTER — Other Ambulatory Visit: Payer: Self-pay | Admitting: Cardiovascular Disease

## 2020-01-09 ENCOUNTER — Other Ambulatory Visit: Payer: Self-pay | Admitting: Family Medicine

## 2020-01-09 DIAGNOSIS — Z1231 Encounter for screening mammogram for malignant neoplasm of breast: Secondary | ICD-10-CM

## 2020-01-16 ENCOUNTER — Ambulatory Visit (INDEPENDENT_AMBULATORY_CARE_PROVIDER_SITE_OTHER): Payer: Medicare Other | Admitting: Pharmacist

## 2020-01-16 DIAGNOSIS — J432 Centrilobular emphysema: Secondary | ICD-10-CM | POA: Diagnosis not present

## 2020-01-16 NOTE — Chronic Care Management (AMB) (Signed)
Chronic Care Management   Follow Up Note   01/16/2020 Name: Martha Woodard MRN: [id number] DOB: 12/11/1965  Referred by: Sonnenberg, Eric G, MD Reason for referral : Chronic Care Management (Medication Management)   Martha Woodard is a 53 y.o. year old female who is a primary care patient of Sonnenberg, Eric G, MD. The CCM team was consulted for assistance with chronic disease management and care coordination needs.    Contacted patient for medication management review.  Review of patient status, including review of consultants reports, relevant laboratory and other test results, and collaboration with appropriate care team members and the patient's provider was performed as part of comprehensive patient evaluation and provision of chronic care management services.    SDOH (Social Determinants of Health) assessments performed: Yes See Care Plan activities for detailed interventions related to SDOH)     Outpatient Encounter Medications as of 01/16/2020  Medication Sig Note  . ACCU-CHEK SOFTCLIX LANCETS lancets TEST UP TO 4 TIMES A DAY   . aspirin EC 81 MG tablet Take 1 tablet (81 mg total) by mouth daily.   . blood glucose meter kit and supplies Dispense based on patient and insurance preference. Use up to four times daily as directed. (FOR ICD-9 250.00, 250.01).   . calcium-vitamin D (OSCAL WITH D) 500-200 MG-UNIT tablet Take 1 tablet by mouth 2 (two) times daily.   . cetirizine (ZYRTEC) 10 MG tablet Take 10 mg by mouth daily.   . fluticasone (FLONASE) 50 MCG/ACT nasal spray USE 1 OR 2 SPRAYS IN EACH NOSTRIL ONCE DAILY.   . furosemide (LASIX) 20 MG tablet TAKE 1 TABLET BY MOUTH  DAILY AS NEEDED 01/16/2020: 1 daily  . glucose blood (ACCU-CHEK AVIVA PLUS) test strip TEST UP TO 4 TIMES A DAY AS DIRECTED   . hydrochlorothiazide (MICROZIDE) 12.5 MG capsule TAKE 1 CAPSULE BY MOUTH  DAILY   . ketoconazole (NIZORAL) 2 % shampoo Apply 1 application topically 2 (two) times a week.   . losartan  (COZAAR) 25 MG tablet TAKE 1 TABLET BY MOUTH  TWICE DAILY   . metFORMIN (GLUCOPHAGE) 500 MG tablet TAKE 2 TABLETS BY MOUTH  TWICE DAILY WITH MEALS   . metoprolol succinate (TOPROL-XL) 25 MG 24 hr tablet TAKE 1 TABLET BY MOUTH  DAILY   . mometasone-formoterol (DULERA) 200-5 MCG/ACT AERO Inhale 2 puffs into the lungs 2 (two) times daily.   . Multiple Vitamin (MULTIVITAMIN) tablet Take 1 tablet by mouth daily.   . nystatin cream (MYCOSTATIN) Apply 1 application topically 2 (two) times daily. 01/16/2020: PRN under breast  . OXYGEN Inhale 2 L into the lungs daily. 2 liters at night, 1 liter when doing activities, and no oxygen when at home during the day.   . potassium chloride (KLOR-CON) 10 MEQ tablet TAKE 1 TABLET BY MOUTH  TWICE DAILY   . rosuvastatin (CRESTOR) 20 MG tablet TAKE 1 TABLET BY MOUTH  DAILY   . Tiotropium Bromide Monohydrate (SPIRIVA RESPIMAT) 2.5 MCG/ACT AERS Inhale 2.5 mcg into the lungs daily.   . vitamin C (ASCORBIC ACID) 500 MG tablet Take 1,000 mg by mouth daily.    . Vitamin E 180 MG CAPS Take 1 capsule by mouth daily.   . [DISCONTINUED] mometasone-formoterol (DULERA) 100-5 MCG/ACT AERO INHALE 2 PUFFS INTO LUNG THE LUNGS TWICEDAILY   . [DISCONTINUED] potassium chloride (K-DUR) 10 MEQ tablet Take 1 tablet (10 mEq total) by mouth 2 (two) times daily.   . albuterol (PROVENTIL) (2.5 MG/3ML) 0.083% nebulizer solution   Chronic Care Management   Follow Up Note   01/16/2020 Name: Martha Woodard MRN: [id number] DOB: 12/11/1965  Referred by: Sonnenberg, Eric G, MD Reason for referral : Chronic Care Management (Medication Management)   Martha Woodard is a 53 y.o. year old female who is a primary care patient of Sonnenberg, Eric G, MD. The CCM team was consulted for assistance with chronic disease management and care coordination needs.    Contacted patient for medication management review.  Review of patient status, including review of consultants reports, relevant laboratory and other test results, and collaboration with appropriate care team members and the patient's provider was performed as part of comprehensive patient evaluation and provision of chronic care management services.    SDOH (Social Determinants of Health) assessments performed: Yes See Care Plan activities for detailed interventions related to SDOH)     Outpatient Encounter Medications as of 01/16/2020  Medication Sig Note  . ACCU-CHEK SOFTCLIX LANCETS lancets TEST UP TO 4 TIMES A DAY   . aspirin EC 81 MG tablet Take 1 tablet (81 mg total) by mouth daily.   . blood glucose meter kit and supplies Dispense based on patient and insurance preference. Use up to four times daily as directed. (FOR ICD-9 250.00, 250.01).   . calcium-vitamin D (OSCAL WITH D) 500-200 MG-UNIT tablet Take 1 tablet by mouth 2 (two) times daily.   . cetirizine (ZYRTEC) 10 MG tablet Take 10 mg by mouth daily.   . fluticasone (FLONASE) 50 MCG/ACT nasal spray USE 1 OR 2 SPRAYS IN EACH NOSTRIL ONCE DAILY.   . furosemide (LASIX) 20 MG tablet TAKE 1 TABLET BY MOUTH  DAILY AS NEEDED 01/16/2020: 1 daily  . glucose blood (ACCU-CHEK AVIVA PLUS) test strip TEST UP TO 4 TIMES A DAY AS DIRECTED   . hydrochlorothiazide (MICROZIDE) 12.5 MG capsule TAKE 1 CAPSULE BY MOUTH  DAILY   . ketoconazole (NIZORAL) 2 % shampoo Apply 1 application topically 2 (two) times a week.   . losartan  (COZAAR) 25 MG tablet TAKE 1 TABLET BY MOUTH  TWICE DAILY   . metFORMIN (GLUCOPHAGE) 500 MG tablet TAKE 2 TABLETS BY MOUTH  TWICE DAILY WITH MEALS   . metoprolol succinate (TOPROL-XL) 25 MG 24 hr tablet TAKE 1 TABLET BY MOUTH  DAILY   . mometasone-formoterol (DULERA) 200-5 MCG/ACT AERO Inhale 2 puffs into the lungs 2 (two) times daily.   . Multiple Vitamin (MULTIVITAMIN) tablet Take 1 tablet by mouth daily.   . nystatin cream (MYCOSTATIN) Apply 1 application topically 2 (two) times daily. 01/16/2020: PRN under breast  . OXYGEN Inhale 2 L into the lungs daily. 2 liters at night, 1 liter when doing activities, and no oxygen when at home during the day.   . potassium chloride (KLOR-CON) 10 MEQ tablet TAKE 1 TABLET BY MOUTH  TWICE DAILY   . rosuvastatin (CRESTOR) 20 MG tablet TAKE 1 TABLET BY MOUTH  DAILY   . Tiotropium Bromide Monohydrate (SPIRIVA RESPIMAT) 2.5 MCG/ACT AERS Inhale 2.5 mcg into the lungs daily.   . vitamin C (ASCORBIC ACID) 500 MG tablet Take 1,000 mg by mouth daily.    . Vitamin E 180 MG CAPS Take 1 capsule by mouth daily.   . [DISCONTINUED] mometasone-formoterol (DULERA) 100-5 MCG/ACT AERO INHALE 2 PUFFS INTO LUNG THE LUNGS TWICEDAILY   . [DISCONTINUED] potassium chloride (K-DUR) 10 MEQ tablet Take 1 tablet (10 mEq total) by mouth 2 (two) times daily.   . albuterol (PROVENTIL) (2.5 MG/3ML) 0.083% nebulizer solution   Chronic Care Management   Follow Up Note   01/16/2020 Name: Martha Woodard MRN: [id number] DOB: 12/11/1965  Referred by: Sonnenberg, Eric G, MD Reason for referral : Chronic Care Management (Medication Management)   Martha Woodard is a 53 y.o. year old female who is a primary care patient of Sonnenberg, Eric G, MD. The CCM team was consulted for assistance with chronic disease management and care coordination needs.    Contacted patient for medication management review.  Review of patient status, including review of consultants reports, relevant laboratory and other test results, and collaboration with appropriate care team members and the patient's provider was performed as part of comprehensive patient evaluation and provision of chronic care management services.    SDOH (Social Determinants of Health) assessments performed: Yes See Care Plan activities for detailed interventions related to SDOH)     Outpatient Encounter Medications as of 01/16/2020  Medication Sig Note  . ACCU-CHEK SOFTCLIX LANCETS lancets TEST UP TO 4 TIMES A DAY   . aspirin EC 81 MG tablet Take 1 tablet (81 mg total) by mouth daily.   . blood glucose meter kit and supplies Dispense based on patient and insurance preference. Use up to four times daily as directed. (FOR ICD-9 250.00, 250.01).   . calcium-vitamin D (OSCAL WITH D) 500-200 MG-UNIT tablet Take 1 tablet by mouth 2 (two) times daily.   . cetirizine (ZYRTEC) 10 MG tablet Take 10 mg by mouth daily.   . fluticasone (FLONASE) 50 MCG/ACT nasal spray USE 1 OR 2 SPRAYS IN EACH NOSTRIL ONCE DAILY.   . furosemide (LASIX) 20 MG tablet TAKE 1 TABLET BY MOUTH  DAILY AS NEEDED 01/16/2020: 1 daily  . glucose blood (ACCU-CHEK AVIVA PLUS) test strip TEST UP TO 4 TIMES A DAY AS DIRECTED   . hydrochlorothiazide (MICROZIDE) 12.5 MG capsule TAKE 1 CAPSULE BY MOUTH  DAILY   . ketoconazole (NIZORAL) 2 % shampoo Apply 1 application topically 2 (two) times a week.   . losartan  (COZAAR) 25 MG tablet TAKE 1 TABLET BY MOUTH  TWICE DAILY   . metFORMIN (GLUCOPHAGE) 500 MG tablet TAKE 2 TABLETS BY MOUTH  TWICE DAILY WITH MEALS   . metoprolol succinate (TOPROL-XL) 25 MG 24 hr tablet TAKE 1 TABLET BY MOUTH  DAILY   . mometasone-formoterol (DULERA) 200-5 MCG/ACT AERO Inhale 2 puffs into the lungs 2 (two) times daily.   . Multiple Vitamin (MULTIVITAMIN) tablet Take 1 tablet by mouth daily.   . nystatin cream (MYCOSTATIN) Apply 1 application topically 2 (two) times daily. 01/16/2020: PRN under breast  . OXYGEN Inhale 2 L into the lungs daily. 2 liters at night, 1 liter when doing activities, and no oxygen when at home during the day.   . potassium chloride (KLOR-CON) 10 MEQ tablet TAKE 1 TABLET BY MOUTH  TWICE DAILY   . rosuvastatin (CRESTOR) 20 MG tablet TAKE 1 TABLET BY MOUTH  DAILY   . Tiotropium Bromide Monohydrate (SPIRIVA RESPIMAT) 2.5 MCG/ACT AERS Inhale 2.5 mcg into the lungs daily.   . vitamin C (ASCORBIC ACID) 500 MG tablet Take 1,000 mg by mouth daily.    . Vitamin E 180 MG CAPS Take 1 capsule by mouth daily.   . [DISCONTINUED] mometasone-formoterol (DULERA) 100-5 MCG/ACT AERO INHALE 2 PUFFS INTO LUNG THE LUNGS TWICEDAILY   . [DISCONTINUED] potassium chloride (K-DUR) 10 MEQ tablet Take 1 tablet (10 mEq total) by mouth 2 (two) times daily.   . albuterol (PROVENTIL) (2.5 MG/3ML) 0.083% nebulizer solution

## 2020-01-16 NOTE — Patient Instructions (Addendum)
Martha Woodard,   It was great talking with you today!  Schedule follow up with Dr. Patsey Berthold to talk about your breathing and next steps once you complete your supply of Dulera. One company, [employer], has recently changed their requirements, and you should be eligible for assistance through them. They make a medication called Symbicort that could replace the [facility]. However, they also make a medication called Breztri that would replace Dulera + Spiriva. This might be a good option, since you said the Spiriva Respimat makes you cough (so you may not be retaining all of the dose).   I'll reach out to Dr. Patsey Berthold, but also take her this information to discuss when you see her next.   Take care! Visit Information  Goals Addressed            This Visit's Progress     Patient Stated   . "I want to stay healthy" (pt-stated)       CARE PLAN ENTRY (see longtitudinal plan of care for additional care plan information)  Current Barriers:  . Polypharmacy; complex patient with multiple comorbidities including T2DM, CHF, COPD, HTN, HLD o COPD: Dulera 200/5 mcg, Spiriva Respimat 2.5 mcg daily (reports she coughs after she takes this medication) Proventil PRN (notes that she has not needed rescue albuterol since she got over her prolonged sinus infection) - Patient APPROVED for Spiriva Handihaler assistance through [facility] - Patient APPROVED for [facility] assistance through [employer], however, [employer] will no longer be supplying [place] through this program. Patient has a 3 month supply of Dulera from them that she will start on later this week - Does not have f/u scheduled w/ Dr. Patsey Berthold o HTN/CHF: HCTZ 12.5 mg daily, losartan 25 mg twice daily, metoprolol succinate 25 mg daily, furosemide 20 mg PRN, though is taking 1 daily - controlled at last office visit. Does NOT have a BP cuff at home. Discussed previously that she could get it through OTC benefits, however, she generally uses all of her OTC quarterly  benefits on vitamins o HLD: rosuvastatin 20 mg daily; LDL at goal <70 o T2DM: metformin 1000 mg BID; A1c at goal <7% o Topicals: nystatin suspension- reports mouth thrush resolved since she recovered from sinus infection  o Ketoconazole shampoo -uses PRN for scalp breakouts   Pharmacist Clinical Goal(s):  Marland Kitchen Over the next 90 days, patient will work with PharmD and provider towards optimized medication management  Interventions: . Comprehensive medication review performed, medication list updated in electronic medical record . Inter-disciplinary care team collaboration (see longitudinal plan of care) . Discussed that we will need a new plan for chronic inhaler therapy once she completes current [facility] supply. Could switch Dulera to Symbicort, as patient would qualify for assistance. However, Dr. Patsey Berthold previously noted wanted to put on a triple therapy regimen. Patient is to call Dr. Domingo Dimes office to schedule follow up. I will route my note to Dr. Patsey Berthold to discuss applying for patient assistance for Symbicort vs Breztri (w/ d/c of Spiriva, which might be preferred as patient reports that she generally coughs after taking Spiriva Respimat. Encouraged to take Respimat device to Dr. Domingo Dimes follow up  Patient Self Care Activities:  . Patient will take medications as prescribed  Please see past updates related to this goal by clicking on the "Past Updates" button in the selected goal         The patient verbalized understanding of instructions provided today and agreed to receive a mailed copy of patient instruction and/or  Scientist, clinical (histocompatibility and immunogenetics).   Plan:  - Scheduled f/u call 03/12/20  [name], PharmD, BCACP, CPP Clinical Pharmacist [place] [phone]

## 2020-01-29 DIAGNOSIS — J449 Chronic obstructive pulmonary disease, unspecified: Secondary | ICD-10-CM | POA: Diagnosis not present

## 2020-02-06 ENCOUNTER — Telehealth: Payer: Self-pay | Admitting: Family Medicine

## 2020-02-06 MED ORDER — ACCU-CHEK SOFTCLIX LANCETS MISC: 3 refills | Status: DC

## 2020-02-06 NOTE — Telephone Encounter (Signed)
Pt needs refill for Accucheck test strips sent to [facility] mail service.

## 2020-02-07 ENCOUNTER — Encounter: Payer: Self-pay | Admitting: Family Medicine

## 2020-02-07 ENCOUNTER — Telehealth (INDEPENDENT_AMBULATORY_CARE_PROVIDER_SITE_OTHER): Payer: Medicare Other | Admitting: Family Medicine

## 2020-02-07 ENCOUNTER — Other Ambulatory Visit: Payer: Self-pay

## 2020-02-07 DIAGNOSIS — R0981 Nasal congestion: Secondary | ICD-10-CM | POA: Diagnosis not present

## 2020-02-07 MED ORDER — DOXYCYCLINE HYCLATE 100 MG PO TABS: 100.0000 mg | ORAL_TABLET | Freq: Two times a day (BID) | ORAL | 0 refills | Status: DC

## 2020-02-07 MED ORDER — ACCU-CHEK AVIVA PLUS VI STRP: ORAL_STRIP | 4 refills | Status: DC

## 2020-02-07 NOTE — Addendum Note (Signed)
Addended by: Elpidio Galea T on: 02/07/2020 11:20 AM   Modules accepted: Orders

## 2020-02-07 NOTE — Telephone Encounter (Signed)
Pt needs refill on glucose blood (ACCU-CHEK AVIVA PLUS) test strip not lancets. Pt is out

## 2020-02-07 NOTE — Assessment & Plan Note (Signed)
Suspect sinus infection though advised we could not rule out COVID-19.  Discussed getting tested for COVID-19 though she defers this at this time.  She will stay quarantined at home for at least the next 7 days and until her symptoms improve.  We will treat with doxycycline for the sinus infection.  Given reasons to seek medical attention the emergency room.

## 2020-02-07 NOTE — Progress Notes (Signed)
Virtual Visit via telephone Note  This visit type was conducted due to national recommendations for restrictions regarding the COVID-19 pandemic (e.g. social distancing).  This format is felt to be most appropriate for this patient at this time.  All issues noted in this document were discussed and addressed.  No physical exam was performed (except for noted visual exam findings with Video Visits).   I connected with Martha Woodard today at  9:00 AM EDT by telephone and verified that I am speaking with the correct person using two identifiers. Location patient: home Location provider: work Persons participating in the virtual visit: patient, provider  I discussed the limitations, risks, security and privacy concerns of performing an evaluation and management service by telephone and the availability of in person appointments. I also discussed with the patient that there may be a patient responsible charge related to this service. The patient expressed understanding and agreed to proceed.  Interactive audio and video telecommunications were attempted between this provider and patient, however failed, due to patient having technical difficulties OR patient did not have access to video capability.  We continued and completed visit with audio only.   Reason for visit: same day visit  HPI: Sinus infection: Patient notes is been going on for 3 days.  She notes significant pain and pressure in her frontal and maxillary sinuses.  Blowing yellow/red mucus out of her nose.  She is coughing from the drainage.  No fevers.  No productive cough.  Some shortness of breath that is mild with coughing only.  She notes her smell is starting to go.  No taste disturbances.  No COVID-19 exposure.  She has not been vaccinated once Covid.  She has had Covid in the past.   ROS: See pertinent positives and negatives per HPI.  Past Medical History:  Diagnosis Date  . CHF (congestive heart failure) ([place])   . Colon  polyp 01-01-2016  . COPD (chronic obstructive pulmonary disease) ([place])   . Diabetes mellitus without complication ([place])   . Diastolic heart failure ([place])   . HLD (hyperlipidemia)   . Hypertension   . Morbid obesity with BMI of 45.0-49.9, adult ([place])   . Multiple lung nodules on CT   . Shortness of breath dyspnea     Past Surgical History:  Procedure Laterality Date  . BIOPSY THYROID Right 02-06-14   NEGATIVE FOR MALIGNANT CELLS proteinaceous material and macrophages.  Marland Kitchen BREAST BIOPSY Left    neg- core  . COLONOSCOPY WITH PROPOFOL N/A 01/01/2016   Procedure: COLONOSCOPY WITH PROPOFOL;  Surgeon: Lucilla Lame, MD;  Location: [facility] ENDOSCOPY;  Service: Endoscopy;  Laterality: N/A;  . thryoid fna Right April 2015   Proteinaceous material and macrophages.  . WISDOM TOOTH EXTRACTION      Family History  Problem Relation Age of Onset  . Cancer Mother 68       uterian and ovarian   . Goiter Mother   . Stroke Father   . Breast cancer Sister 31  . Stroke Brother   . Breast cancer Sister 27  . Heart murmur Sister     SOCIAL HX: Former smoker   Current Outpatient Medications:  .  Accu-Chek Softclix Lancets lancets, TEST UP TO 4 TIMES A DAY, Disp: 200 each, Rfl: 3 .  albuterol (PROVENTIL) (2.5 MG/3ML) 0.083% nebulizer solution, Take 3 mLs (2.5 mg total) by nebulization every 6 (six) hours as needed for wheezing or shortness of breath., Disp: 75 mL, Rfl: 12 .  aspirin  EC 81 MG tablet, Take 1 tablet (81 mg total) by mouth daily., Disp: 90 tablet, Rfl: 3 .  blood glucose meter kit and supplies, Dispense based on patient and insurance preference. Use up to four times daily as directed. (FOR ICD-9 250.00, 250.01)., Disp: 1 each, Rfl: 0 .  calcium-vitamin D (OSCAL WITH D) 500-200 MG-UNIT tablet, Take 1 tablet by mouth 2 (two) times daily., Disp: , Rfl:  .  cetirizine (ZYRTEC) 10 MG tablet, Take 10 mg by mouth daily., Disp: , Rfl:  .  fluticasone (FLONASE) 50 MCG/ACT nasal spray, USE 1 OR 2 SPRAYS  IN EACH NOSTRIL ONCE DAILY., Disp: 16 g, Rfl: 12 .  furosemide (LASIX) 20 MG tablet, TAKE 1 TABLET BY MOUTH  DAILY AS NEEDED, Disp: 90 tablet, Rfl: 2 .  glucose blood (ACCU-CHEK AVIVA PLUS) test strip, TEST UP TO 4 TIMES A DAY AS DIRECTED, Disp: 100 each, Rfl: 4 .  hydrochlorothiazide (MICROZIDE) 12.5 MG capsule, TAKE 1 CAPSULE BY MOUTH  DAILY, Disp: 90 capsule, Rfl: 3 .  ketoconazole (NIZORAL) 2 % cream, Apply 1 application topically daily., Disp: 15 g, Rfl: 0 .  ketoconazole (NIZORAL) 2 % shampoo, Apply 1 application topically 2 (two) times a week., Disp: 120 mL, Rfl: 0 .  losartan (COZAAR) 25 MG tablet, TAKE 1 TABLET BY MOUTH  TWICE DAILY, Disp: 180 tablet, Rfl: 2 .  metFORMIN (GLUCOPHAGE) 500 MG tablet, TAKE 2 TABLETS BY MOUTH  TWICE DAILY WITH MEALS, Disp: 360 tablet, Rfl: 3 .  metoprolol succinate (TOPROL-XL) 25 MG 24 hr tablet, TAKE 1 TABLET BY MOUTH  DAILY, Disp: 90 tablet, Rfl: 3 .  mometasone-formoterol (DULERA) 200-5 MCG/ACT AERO, Inhale 2 puffs into the lungs 2 (two) times daily., Disp: , Rfl:  .  Multiple Vitamin (MULTIVITAMIN) tablet, Take 1 tablet by mouth daily., Disp: , Rfl:  .  nystatin (MYCOSTATIN) 100000 UNIT/ML suspension, Take 5 mLs (500,000 Units total) by mouth 4 (four) times daily. Swish and swallow, Disp: 120 mL, Rfl: 0 .  nystatin cream (MYCOSTATIN), Apply 1 application topically 2 (two) times daily., Disp: 30 g, Rfl: 0 .  ondansetron (ZOFRAN ODT) 4 MG disintegrating tablet, Take 1 tablet (4 mg total) by mouth every 8 (eight) hours as needed for nausea or vomiting., Disp: 20 tablet, Rfl: 0 .  OXYGEN, Inhale 2 L into the lungs daily. 2 liters at night, 1 liter when doing activities, and no oxygen when at home during the day., Disp: , Rfl:  .  potassium chloride (KLOR-CON) 10 MEQ tablet, TAKE 1 TABLET BY MOUTH  TWICE DAILY, Disp: 180 tablet, Rfl: 2 .  PROVENTIL HFA 108 (90 Base) MCG/ACT inhaler, Inhale 2 puffs into the lungs every 6 (six) hours as needed for wheezing or  shortness of breath., Disp: 3 Inhaler, Rfl: 1 .  rosuvastatin (CRESTOR) 20 MG tablet, TAKE 1 TABLET BY MOUTH  DAILY, Disp: 90 tablet, Rfl: 3 .  Tiotropium Bromide Monohydrate (SPIRIVA RESPIMAT) 2.5 MCG/ACT AERS, Inhale 2.5 mcg into the lungs daily., Disp: 4 g, Rfl: 2 .  vitamin C (ASCORBIC ACID) 500 MG tablet, Take 1,000 mg by mouth daily. , Disp: , Rfl:  .  Vitamin E 180 MG CAPS, Take 1 capsule by mouth daily., Disp: , Rfl:  .  doxycycline (VIBRA-TABS) 100 MG tablet, Take 1 tablet (100 mg total) by mouth 2 (two) times daily., Disp: 14 tablet, Rfl: 0  EXAM: This is a telephone visit and thus no physical exam was completed.   ASSESSMENT AND PLAN:  Discussed the following assessment and plan:  Sinus congestion Suspect sinus infection though advised we could not rule out COVID-19.  Discussed getting tested for COVID-19 though she defers this at this time.  She will stay quarantined at home for at least the next 7 days and until her symptoms improve.  We will treat with doxycycline for the sinus infection.  Given reasons to seek medical attention the emergency room. She will have a family member pick up her antibiotic.  No orders of the defined types were placed in this encounter.   Meds ordered this encounter  Medications  . doxycycline (VIBRA-TABS) 100 MG tablet    Sig: Take 1 tablet (100 mg total) by mouth 2 (two) times daily.    Dispense:  14 tablet    Refill:  0     I discussed the assessment and treatment plan with the patient. The patient was provided an opportunity to ask questions and all were answered. The patient agreed with the plan and demonstrated an understanding of the instructions.   The patient was advised to call back or seek an in-person evaluation if the symptoms worsen or if the condition fails to improve as anticipated.  I provided 6 minutes of non-face-to-face time during this encounter.   [name], MD

## 2020-02-24 ENCOUNTER — Other Ambulatory Visit
Admission: RE | Admit: 2020-02-24 | Discharge: 2020-02-24 | Disposition: A | Discharge status: Home / Self Care | Payer: Medicare Other | Source: Ambulatory Visit | Attending: Pulmonary Disease | Admitting: Pulmonary Disease

## 2020-02-24 ENCOUNTER — Other Ambulatory Visit: Payer: Self-pay

## 2020-02-24 DIAGNOSIS — Z20822 Contact with and (suspected) exposure to covid-19: Secondary | ICD-10-CM | POA: Diagnosis not present

## 2020-02-24 DIAGNOSIS — Z01812 Encounter for preprocedural laboratory examination: Secondary | ICD-10-CM | POA: Insufficient documentation

## 2020-02-25 LAB — SARS CORONAVIRUS 2 (TAT 6-24 HRS): SARS Coronavirus 2: NEGATIVE

## 2020-02-28 ENCOUNTER — Ambulatory Visit: Payer: Medicare Other | Attending: Pulmonary Disease

## 2020-02-28 ENCOUNTER — Other Ambulatory Visit: Payer: Self-pay

## 2020-02-28 DIAGNOSIS — J449 Chronic obstructive pulmonary disease, unspecified: Secondary | ICD-10-CM

## 2020-02-28 MED ORDER — ALBUTEROL SULFATE (2.5 MG/3ML) 0.083% IN NEBU
2.5000 mg | INHALATION_SOLUTION | Freq: Once | RESPIRATORY_TRACT | Status: AC
  Administered 2020-02-28: 2.5 mg via RESPIRATORY_TRACT
  Filled 2020-02-28: qty 3

## 2020-02-29 DIAGNOSIS — J449 Chronic obstructive pulmonary disease, unspecified: Secondary | ICD-10-CM | POA: Diagnosis not present

## 2020-03-12 ENCOUNTER — Ambulatory Visit (INDEPENDENT_AMBULATORY_CARE_PROVIDER_SITE_OTHER): Payer: Medicare Other | Admitting: Pharmacist

## 2020-03-12 DIAGNOSIS — I10 Essential (primary) hypertension: Secondary | ICD-10-CM

## 2020-03-12 DIAGNOSIS — E1159 Type 2 diabetes mellitus with other circulatory complications: Secondary | ICD-10-CM | POA: Diagnosis not present

## 2020-03-12 DIAGNOSIS — J432 Centrilobular emphysema: Secondary | ICD-10-CM | POA: Diagnosis not present

## 2020-03-12 MED ORDER — BREZTRI AEROSPHERE 160-9-4.8 MCG/ACT IN AERO: 2.0000 | INHALATION_SPRAY | Freq: Two times a day (BID) | RESPIRATORY_TRACT | 2 refills | Status: DC

## 2020-03-12 NOTE — Chronic Care Management (AMB) (Addendum)
Chronic Care Management  Note   Follow Up  03/12/2020 Name: Martha Woodard MRN: [id number] DOB: 1966-02-14  Referred by: Leone Haven, MD Reason for referral : Chronic Care Management (Medication Management)   Martha Woodard is a 54 y.o. year old female who is a primary care patient of Caryl Bis, Angela Adam, MD. The CCM team was consulted for assistance with chronic disease management and care coordination needs.    Contacted patient for medication management review.   Review of patient status, including review of consultants reports, relevant laboratory and other test results, and collaboration with appropriate care team members and the patient's provider was performed as part of comprehensive patient evaluation and provision of chronic care management services.    SDOH (Social Determinants of Health) assessments performed: Yes See Care Plan activities for detailed interventions related to [facility])     Outpatient Encounter Medications as of 03/12/2020  Medication Sig Note  . Accu-Chek Softclix Lancets lancets TEST UP TO 4 TIMES A DAY   . albuterol (PROVENTIL) (2.5 MG/3ML) 0.083% nebulizer solution Take 3 mLs (2.5 mg total) by nebulization every 6 (six) hours as needed for wheezing or shortness of breath.   Marland Kitchen aspirin EC 81 MG tablet Take 1 tablet (81 mg total) by mouth daily.   . blood glucose meter kit and supplies Dispense based on patient and insurance preference. Use up to four times daily as directed. (FOR ICD-9 250.00, 250.01).   . calcium-vitamin D (OSCAL WITH D) 500-200 MG-UNIT tablet Take 1 tablet by mouth 2 (two) times daily.   . cetirizine (ZYRTEC) 10 MG tablet Take 10 mg by mouth daily.   . fluticasone (FLONASE) 50 MCG/ACT nasal spray USE 1 OR 2 SPRAYS IN EACH NOSTRIL ONCE DAILY.   . furosemide (LASIX) 20 MG tablet TAKE 1 TABLET BY MOUTH  DAILY AS NEEDED 01/16/2020: 1 daily  . glucose blood (ACCU-CHEK AVIVA PLUS) test strip TEST UP TO 4 TIMES A DAY AS DIRECTED   .  hydrochlorothiazide (MICROZIDE) 12.5 MG capsule TAKE 1 CAPSULE BY MOUTH  DAILY   . ketoconazole (NIZORAL) 2 % cream Apply 1 application topically daily.   Marland Kitchen ketoconazole (NIZORAL) 2 % shampoo Apply 1 application topically 2 (two) times a week.   . losartan (COZAAR) 25 MG tablet TAKE 1 TABLET BY MOUTH  TWICE DAILY   . metFORMIN (GLUCOPHAGE) 500 MG tablet TAKE 2 TABLETS BY MOUTH  TWICE DAILY WITH MEALS   . metoprolol succinate (TOPROL-XL) 25 MG 24 hr tablet TAKE 1 TABLET BY MOUTH  DAILY   . mometasone-formoterol (DULERA) 200-5 MCG/ACT AERO Inhale 2 puffs into the lungs 2 (two) times daily.   . Multiple Vitamin (MULTIVITAMIN) tablet Take 1 tablet by mouth daily.   . OXYGEN Inhale 2 L into the lungs daily. 2 liters at night, 1 liter when doing activities, and no oxygen when at home during the day.   . potassium chloride (KLOR-CON) 10 MEQ tablet TAKE 1 TABLET BY MOUTH  TWICE DAILY   . rosuvastatin (CRESTOR) 20 MG tablet TAKE 1 TABLET BY MOUTH  DAILY   . Tiotropium Bromide Monohydrate (SPIRIVA RESPIMAT) 2.5 MCG/ACT AERS Inhale 2.5 mcg into the lungs daily.   . vitamin C (ASCORBIC ACID) 500 MG tablet Take 1,000 mg by mouth daily.    . Vitamin E 180 MG CAPS Take 1 capsule by mouth daily.   Marland Kitchen nystatin (MYCOSTATIN) 100000 UNIT/ML suspension Take 5 mLs (500,000 Units total) by mouth 4 (four) times daily. Swish and  swallow   . nystatin cream (MYCOSTATIN) Apply 1 application topically 2 (two) times daily. 01/16/2020: PRN under breast  . ondansetron (ZOFRAN ODT) 4 MG disintegrating tablet Take 1 tablet (4 mg total) by mouth every 8 (eight) hours as needed for nausea or vomiting.   Marland Kitchen PROVENTIL HFA 108 (90 Base) MCG/ACT inhaler Inhale 2 puffs into the lungs every 6 (six) hours as needed for wheezing or shortness of breath.   . [DISCONTINUED] doxycycline (VIBRA-TABS) 100 MG tablet Take 1 tablet (100 mg total) by mouth 2 (two) times daily.    No facility-administered encounter medications on file as of 03/12/2020.       Objective:   Goals Addressed              This Visit's Progress     Patient Stated   .  "I want to stay healthy" (pt-stated)        CARE PLAN ENTRY (see longtitudinal plan of care for additional care plan information)  Current Barriers:  . Polypharmacy; complex patient with multiple comorbidities including T2DM, CHF, COPD, HTN, HLD . Reports she received her first COVID vaccine 6/10, and felt terrible over the weekend. Did have [place] in 08/2019 . Reports that insurance home visit showed A1c 6.5%, BP 132/70 o COPD: Dulera 200/5 mcg, Spiriva Respimat 2.5 mcg daily, Proventil PRN. F/u w/ pulmonology in July, but does not have adequate Dulera to get to that appointment o Reviewed PFT: severe obstructive lung disease w/ little post bronchodilator reversibility o Plan to pursue Breztri assistance to allow for continued triple therapy - Patient APPROVED for Spiriva Handihaler assistance through [facility] - Patient APPROVED for [facility] assistance through [employer], however, [employer] will no longer be supplying [employer] through this program.  o HTN/CHF: HCTZ 12.5 mg daily, losartan 25 mg twice daily, metoprolol succinate 25 mg daily, furosemide 20 mg PRN, though is taking 1 daily - controlled at last office visit. o HLD: rosuvastatin 20 mg daily; LDL at goal <70 o T2DM: metformin 1000 mg BID; A1c at goal <7% o Topicals: nystatin suspension PRN thrush o Ketoconazole shampoo -uses PRN for scalp breakouts   Pharmacist Clinical Goal(s):  Marland Kitchen Over the next 90 days, patient will work with PharmD and provider towards optimized medication management  Interventions: . Comprehensive medication review performed, medication list updated in electronic medical record . Inter-disciplinary care team collaboration (see longitudinal plan of care) . Previously discussed w/ Dr. Patsey Berthold regarding switch to triple therapy. Patient does NOT qualify for Trelegy assistance through [place], as she has not spent $600 out of pocket  on copays yet. Will collaborate w/ CPhT to fax provider portion to Dr. Patsey Berthold. Will collaborate w/ CPhT to mail patient portion to patient. Once all parts received, will collaborate w/ CPhT to fax to [place].   Patient Self Care Activities:  . Patient will take medications as prescribed  Please see past updates related to this goal by clicking on the "Past Updates" button in the selected goal          Plan:  - Scheduled f/u call in ~ 12 weeks  [name], PharmD, [place], [place] Pharmacist [place] [phone]

## 2020-03-12 NOTE — Patient Instructions (Addendum)
Visit Information  Goals Addressed              This Visit's Progress     Patient Stated   .  "I want to stay healthy" (pt-stated)        CARE PLAN ENTRY (see longtitudinal plan of care for additional care plan information)  Current Barriers:  . Polypharmacy; complex patient with multiple comorbidities including T2DM, CHF, COPD, HTN, HLD . Reports she received her first COVID vaccine 6/10, and felt terrible over the weekend. Did have [place] in 08/2019 . Reports that insurance home visit showed A1c 6.5%, BP 132/70 o COPD: Dulera 200/5 mcg, Spiriva Respimat 2.5 mcg daily, Proventil PRN. F/u w/ pulmonology in July, but does not have adequate Dulera to get to that appointment o Reviewed PFT: severe obstructive lung disease w/ little post bronchodilator reversibility o Plan to pursue Breztri assistance to allow for continued triple therapy - Patient APPROVED for Spiriva Handihaler assistance through [facility] - Patient APPROVED for [facility] assistance through [employer], however, [employer] will no longer be supplying [employer] through this program.  o HTN/CHF: HCTZ 12.5 mg daily, losartan 25 mg twice daily, metoprolol succinate 25 mg daily, furosemide 20 mg PRN, though is taking 1 daily - controlled at last office visit. o HLD: rosuvastatin 20 mg daily; LDL at goal <70 o T2DM: metformin 1000 mg BID; A1c at goal <7% o Topicals: nystatin suspension PRN thrush o Ketoconazole shampoo -uses PRN for scalp breakouts   Pharmacist Clinical Goal(s):  Marland Kitchen Over the next 90 days, patient will work with PharmD and provider towards optimized medication management  Interventions: . Comprehensive medication review performed, medication list updated in electronic medical record . Inter-disciplinary care team collaboration (see longitudinal plan of care) . Previously discussed w/ Dr. Patsey Berthold regarding switch to triple therapy. Patient does NOT qualify for Trelegy assistance through [place], as she has not spent $600 out of pocket  on copays yet. Will collaborate w/ CPhT to fax provider portion to Dr. Patsey Berthold. Will collaborate w/ CPhT to mail patient portion to patient. Once all parts received, will collaborate w/ CPhT to fax to [place].   Patient Self Care Activities:  . Patient will take medications as prescribed  Please see past updates related to this goal by clicking on the "Past Updates" button in the selected goal         Patient verbalizes understanding of instructions provided today.   Plan:  - Scheduled f/u call in ~ 12 weeks  [name], PharmD, [place], [place] Pharmacist [place] [phone]

## 2020-03-12 NOTE — Addendum Note (Signed)
Addended by: De Hollingshead on: 03/12/2020 11:22 AM   Modules accepted: Orders

## 2020-03-13 ENCOUNTER — Other Ambulatory Visit: Payer: Self-pay | Admitting: Pharmacy Technician

## 2020-03-13 NOTE — Patient Outreach (Signed)
[facility]) Care Management  03/13/2020  [name] 02-18-1966 [id number]                                      Medication Assistance Referral  Referral From: [place] RPh Catie T.   Medication/Company: Judithann Sauger / [place] Patient application portion:  Mailed Provider application portion: Faxed  to Dr. Vernard Gambles Provider address/fax verified via: Office website   Follow up:  Will follow up with patient in 5-10 business days to confirm application(s) have been received.  Carle Fenech P. Taegen Delker, [place]  [phone]

## 2020-03-22 ENCOUNTER — Other Ambulatory Visit: Payer: Self-pay | Admitting: Pharmacy Technician

## 2020-03-22 ENCOUNTER — Ambulatory Visit: Payer: Medicare Other | Admitting: Pharmacist

## 2020-03-22 DIAGNOSIS — J432 Centrilobular emphysema: Secondary | ICD-10-CM

## 2020-03-22 NOTE — Chronic Care Management (AMB) (Signed)
Chronic Care Management   Follow Up Note   03/22/2020 Name: Martha Woodard MRN: [id number] DOB: [birth year]  Referred by: Leone Haven, MD Reason for referral : Chronic Care Management (Medication Management)   Martha Woodard is a 54 y.o. year old female who is a primary care patient of Caryl Bis, Angela Adam, MD. The CCM team was consulted for assistance with chronic disease management and care coordination needs.    Care coordination completed today.   Review of patient status, including review of consultants reports, relevant laboratory and other test results, and collaboration with appropriate care team members and the patient's provider was performed as part of comprehensive patient evaluation and provision of chronic care management services.    SDOH (Social Determinants of Health) assessments performed: Yes See Care Plan activities for detailed interventions related to SDOH)  SDOH Interventions     Most Recent Value  SDOH Interventions  SDOH Interventions for the Following Domains Financial Strain  Financial Strain Interventions Other (Comment)  [patient assistance applications]       Outpatient Encounter Medications as of 03/22/2020  Medication Sig Note  . Accu-Chek Softclix Lancets lancets TEST UP TO 4 TIMES A DAY   . albuterol (PROVENTIL) (2.5 MG/3ML) 0.083% nebulizer solution Take 3 mLs (2.5 mg total) by nebulization every 6 (six) hours as needed for wheezing or shortness of breath.   Marland Kitchen aspirin EC 81 MG tablet Take 1 tablet (81 mg total) by mouth daily.   . blood glucose meter kit and supplies Dispense based on patient and insurance preference. Use up to four times daily as directed. (FOR ICD-9 250.00, 250.01).   . Budeson-Glycopyrrol-Formoterol (BREZTRI AEROSPHERE) 160-9-4.8 MCG/ACT AERO Inhale 2 puffs into the lungs 2 (two) times daily.   . calcium-vitamin D (OSCAL WITH D) 500-200 MG-UNIT tablet Take 1 tablet by mouth 2 (two) times daily.   . cetirizine (ZYRTEC) 10 MG  tablet Take 10 mg by mouth daily.   . fluticasone (FLONASE) 50 MCG/ACT nasal spray USE 1 OR 2 SPRAYS IN EACH NOSTRIL ONCE DAILY.   . furosemide (LASIX) 20 MG tablet TAKE 1 TABLET BY MOUTH  DAILY AS NEEDED 01/16/2020: 1 daily  . glucose blood (ACCU-CHEK AVIVA PLUS) test strip TEST UP TO 4 TIMES A DAY AS DIRECTED   . hydrochlorothiazide (MICROZIDE) 12.5 MG capsule TAKE 1 CAPSULE BY MOUTH  DAILY   . ketoconazole (NIZORAL) 2 % cream Apply 1 application topically daily.   Marland Kitchen ketoconazole (NIZORAL) 2 % shampoo Apply 1 application topically 2 (two) times a week.   . losartan (COZAAR) 25 MG tablet TAKE 1 TABLET BY MOUTH  TWICE DAILY   . metFORMIN (GLUCOPHAGE) 500 MG tablet TAKE 2 TABLETS BY MOUTH  TWICE DAILY WITH MEALS   . metoprolol succinate (TOPROL-XL) 25 MG 24 hr tablet TAKE 1 TABLET BY MOUTH  DAILY   . Multiple Vitamin (MULTIVITAMIN) tablet Take 1 tablet by mouth daily.   Marland Kitchen nystatin (MYCOSTATIN) 100000 UNIT/ML suspension Take 5 mLs (500,000 Units total) by mouth 4 (four) times daily. Swish and swallow   . nystatin cream (MYCOSTATIN) Apply 1 application topically 2 (two) times daily. 01/16/2020: PRN under breast  . ondansetron (ZOFRAN ODT) 4 MG disintegrating tablet Take 1 tablet (4 mg total) by mouth every 8 (eight) hours as needed for nausea or vomiting.   . OXYGEN Inhale 2 L into the lungs daily. 2 liters at night, 1 liter when doing activities, and no oxygen when at home during the day.   Marland Kitchen  potassium chloride (KLOR-CON) 10 MEQ tablet TAKE 1 TABLET BY MOUTH  TWICE DAILY   . PROVENTIL HFA 108 (90 Base) MCG/ACT inhaler Inhale 2 puffs into the lungs every 6 (six) hours as needed for wheezing or shortness of breath.   . rosuvastatin (CRESTOR) 20 MG tablet TAKE 1 TABLET BY MOUTH  DAILY   . vitamin C (ASCORBIC ACID) 500 MG tablet Take 1,000 mg by mouth daily.    . Vitamin E 180 MG CAPS Take 1 capsule by mouth daily.    No facility-administered encounter medications on file as of 03/22/2020.      Objective:   Goals Addressed              This Visit's Progress     Patient Stated   .  "I want to stay healthy" (pt-stated)        CARE PLAN ENTRY (see longtitudinal plan of care for additional care plan information)  Current Barriers:  . Polypharmacy; complex patient with multiple comorbidities including T2DM, CHF, COPD, HTN, HLD o COPD: Dulera 200/5 mcg, Spiriva Respimat 2.5 mcg daily, Proventil PRN. Pursuing coverage for Breztri, as Ruthe Mannan will not be available soon o Reviewed PFT: severe obstructive lung disease w/ little post bronchodilator reversibility - Patient APPROVED for Spiriva Handihaler assistance through [place] o HTN/CHF: HCTZ 12.5 mg daily, losartan 25 mg twice daily, metoprolol succinate 25 mg daily, furosemide 20 mg PRN, though is taking 1 daily - controlled at last office visit. o HLD: rosuvastatin 20 mg daily; LDL at goal <70 o T2DM: metformin 1000 mg BID; A1c at goal <7% o Topicals: nystatin suspension PRN thrush o Ketoconazole shampoo -uses PRN for scalp breakouts   Pharmacist Clinical Goal(s):  Marland Kitchen Over the next 90 days, patient will work with PharmD and provider towards optimized medication management  Interventions: . Have not received signed prescriber portion from pulmonary; for ease of medical team, collaborated w/ Dr. Caryl Bis for his signature on prescription portion. Passed along to CPhT for submission.   Patient Self Care Activities:  . Patient will take medications as prescribed  Please see past updates related to this goal by clicking on the "Past Updates" button in the selected goal          Plan:  - Will outreach as previously scheduled  [name], PharmD, [employer], [place] Pharmacist [place] [phone]

## 2020-03-22 NOTE — Patient Outreach (Signed)
[facility]) Care Management  03/22/2020  Martha Woodard Feb 29, 1966 [id number]   Successful call placed to patient regarding patient assistance application(s) for [facility] with [employer] , HIPAA identifiers verified.   Patient informed she received the application and plans on putting it in the mail back to me today. Patient also informed she was still having some side effects such as nausea, aches & pains, coughing, very short of breath, no fever since receiving the Moderna vaccine on 03/08/2020. She informed her next dose is scheduled of 04/05/2020. She was inquiring if it was normal to continue to have these side effects. Reached out via IM in [place] to embedded [place] who informed she would call patient to discuss.   Follow up:  Will route note to embedded [facility] RPh [name] for case closure if document(s) have not been received in the next 15 business days.  [name], [place]  [phone]

## 2020-03-22 NOTE — Chronic Care Management (AMB) (Signed)
  Chronic Care Management   Note  03/22/2020 Name: TAWONA FILSINGER MRN: [id number] DOB: [birth year]  Jennye Boroughs Ferrari is a 54 y.o. year old female who is a primary care patient of Caryl Bis, Angela Adam, MD. The CCM team was consulted for assistance with chronic disease management and care coordination needs.    Received message from CPhT: "Patient also informed she was still having some side effects such as nausea, aches & pains, coughing, very short of breath, no fever since receiving the Moderna vaccine on 03/08/2020. She informed her next dose is scheduled of 04/05/2020"  Contacted patient. She notes that she has had to use albuterol HFA about once daily. Notes she has pulmonary f/u prior to her second dose. Was not in any breathing distress while on the phone.  Discussed w/ office triage staff. Informed to provide patient w/ phone number to report side effects [phone]. Encouraged to call back to the office if symptoms worsen.   [name], PharmD, [place], CPP Clinical Pharmacist [place] [phone]

## 2020-03-22 NOTE — Patient Instructions (Signed)
Visit Information  Goals Addressed              This Visit's Progress     Patient Stated   .  "I want to stay healthy" (pt-stated)        CARE PLAN ENTRY (see longtitudinal plan of care for additional care plan information)  Current Barriers:  . Polypharmacy; complex patient with multiple comorbidities including T2DM, CHF, COPD, HTN, HLD o COPD: Dulera 200/5 mcg, Spiriva Respimat 2.5 mcg daily, Proventil PRN. Pursuing coverage for Breztri, as Ruthe Mannan will not be available soon o Reviewed PFT: severe obstructive lung disease w/ little post bronchodilator reversibility - Patient APPROVED for Spiriva Handihaler assistance through [place] o HTN/CHF: HCTZ 12.5 mg daily, losartan 25 mg twice daily, metoprolol succinate 25 mg daily, furosemide 20 mg PRN, though is taking 1 daily - controlled at last office visit. o HLD: rosuvastatin 20 mg daily; LDL at goal <70 o T2DM: metformin 1000 mg BID; A1c at goal <7% o Topicals: nystatin suspension PRN thrush o Ketoconazole shampoo -uses PRN for scalp breakouts   Pharmacist Clinical Goal(s):  Marland Kitchen Over the next 90 days, patient will work with PharmD and provider towards optimized medication management  Interventions: . Have not received signed prescriber portion from pulmonary; for ease of medical team, collaborated w/ Dr. Caryl Bis for his signature on prescription portion. Passed along to CPhT for submission.   Patient Self Care Activities:  . Patient will take medications as prescribed  Please see past updates related to this goal by clicking on the "Past Updates" button in the selected goal         The patient verbalized understanding of instructions provided today and declined a print copy of patient instruction materials.    Plan:  - Will outreach as previously scheduled  [name], PharmD, [place], [place] Pharmacist [place] [phone]

## 2020-03-22 NOTE — Progress Notes (Signed)
I called and scheduled the patient for a telephone visit for Monday to discuss symptoms with her PCP.  Skylur Fuston,cma

## 2020-03-22 NOTE — Progress Notes (Signed)
I have reviewed the above note and agree. I was available to the pharmacist for consultation. Patient should have a visit to discuss these symptoms as well. Please contact her to offer her an appointment with me or one of the other providers. It would need to be a virtual visit.   [name], MD

## 2020-03-26 ENCOUNTER — Other Ambulatory Visit: Payer: Self-pay

## 2020-03-26 ENCOUNTER — Other Ambulatory Visit: Payer: Self-pay | Admitting: Pharmacy Technician

## 2020-03-26 ENCOUNTER — Encounter: Payer: Self-pay | Admitting: Family Medicine

## 2020-03-26 ENCOUNTER — Telehealth (INDEPENDENT_AMBULATORY_CARE_PROVIDER_SITE_OTHER): Payer: Medicare Other | Admitting: Family Medicine

## 2020-03-26 DIAGNOSIS — J441 Chronic obstructive pulmonary disease with (acute) exacerbation: Secondary | ICD-10-CM | POA: Diagnosis not present

## 2020-03-26 LAB — HEMOGLOBIN A1C, FINGERSTICK: A1c: 6.5

## 2020-03-26 MED ORDER — PREDNISONE 20 MG PO TABS: 40.0000 mg | ORAL_TABLET | Freq: Every day | ORAL | 0 refills | Status: DC

## 2020-03-26 MED ORDER — DOXYCYCLINE HYCLATE 100 MG PO TABS: 100.0000 mg | ORAL_TABLET | Freq: Two times a day (BID) | ORAL | 0 refills | Status: DC

## 2020-03-26 NOTE — Progress Notes (Addendum)
Virtual Visit via telephone Note  This visit type was conducted due to national recommendations for restrictions regarding the COVID-19 pandemic (e.g. social distancing).  This format is felt to be most appropriate for this patient at this time.  All issues noted in this document were discussed and addressed.  No physical exam was performed (except for noted visual exam findings with Video Visits).   I connected with Martha Woodard today at  4:00 PM EDT by telephone and verified that I am speaking with the correct person using two identifiers. Location patient: home Location provider: work  Persons participating in the virtual visit: patient, provider  I discussed the limitations, risks, security and privacy concerns of performing an evaluation and management service by telephone and the availability of in person appointments. I also discussed with the patient that there may be a patient responsible charge related to this service. The patient expressed understanding and agreed to proceed.  Interactive audio and video telecommunications were attempted between this provider and patient, however failed, due to patient having technical difficulties OR patient did not have access to video capability.  We continued and completed visit with audio only.   Reason for visit: same day visit.   HPI: Sinus congestion/cough: This has been going on since 03/09/2020.  Started a couple of days after her Covid vaccine.  Started with cough and aches and pains.  The aches and pains have improved though she continues to have a hacking cough of productive yellow sputum with some headaches and sinus congestion with yellow mucus out of her nose.  She occasionally has streaks of blood in her mucus.   Ears are stopped up.  No fevers.  No COVID-19 exposure.  She has not left the house since she got the Covid vaccine.   ROS: See pertinent positives and negatives per HPI.  Past Medical History:  Diagnosis Date  . CHF  (congestive heart failure) ([place])   . Colon polyp 01-01-2016  . COPD (chronic obstructive pulmonary disease) ([place])   . Diabetes mellitus without complication ([place])   . Diastolic heart failure ([place])   . HLD (hyperlipidemia)   . Hypertension   . Morbid obesity with BMI of 45.0-49.9, adult ([place])   . Multiple lung nodules on CT   . Shortness of breath dyspnea     Past Surgical History:  Procedure Laterality Date  . BIOPSY THYROID Right 02-06-14   NEGATIVE FOR MALIGNANT CELLS proteinaceous material and macrophages.  Marland Kitchen BREAST BIOPSY Left    neg- core  . COLONOSCOPY WITH PROPOFOL N/A 01/01/2016   Procedure: COLONOSCOPY WITH PROPOFOL;  Surgeon: Lucilla Lame, MD;  Location: [facility] ENDOSCOPY;  Service: Endoscopy;  Laterality: N/A;  . thryoid fna Right April 2015   Proteinaceous material and macrophages.  . WISDOM TOOTH EXTRACTION      Family History  Problem Relation Age of Onset  . Cancer Mother 12       uterian and ovarian   . Goiter Mother   . Stroke Father   . Breast cancer Sister 22  . Stroke Brother   . Breast cancer Sister 25  . Heart murmur Sister     SOCIAL HX: former smoker   Current Outpatient Medications:  .  Accu-Chek Softclix Lancets lancets, TEST UP TO 4 TIMES A DAY, Disp: 200 each, Rfl: 3 .  albuterol (PROVENTIL) (2.5 MG/3ML) 0.083% nebulizer solution, Take 3 mLs (2.5 mg total) by nebulization every 6 (six) hours as needed for wheezing or shortness of breath., Disp:  75 mL, Rfl: 12 .  aspirin EC 81 MG tablet, Take 1 tablet (81 mg total) by mouth daily., Disp: 90 tablet, Rfl: 3 .  blood glucose meter kit and supplies, Dispense based on patient and insurance preference. Use up to four times daily as directed. (FOR ICD-9 250.00, 250.01)., Disp: 1 each, Rfl: 0 .  Budeson-Glycopyrrol-Formoterol (BREZTRI AEROSPHERE) 160-9-4.8 MCG/ACT AERO, Inhale 2 puffs into the lungs 2 (two) times daily., Disp: 10.7 g, Rfl: 2 .  calcium-vitamin D (OSCAL WITH D) 500-200 MG-UNIT tablet, Take 1  tablet by mouth 2 (two) times daily., Disp: , Rfl:  .  cetirizine (ZYRTEC) 10 MG tablet, Take 10 mg by mouth daily., Disp: , Rfl:  .  fluticasone (FLONASE) 50 MCG/ACT nasal spray, USE 1 OR 2 SPRAYS IN EACH NOSTRIL ONCE DAILY., Disp: 16 g, Rfl: 12 .  furosemide (LASIX) 20 MG tablet, TAKE 1 TABLET BY MOUTH  DAILY AS NEEDED, Disp: 90 tablet, Rfl: 2 .  glucose blood (ACCU-CHEK AVIVA PLUS) test strip, TEST UP TO 4 TIMES A DAY AS DIRECTED, Disp: 100 each, Rfl: 4 .  hydrochlorothiazide (MICROZIDE) 12.5 MG capsule, TAKE 1 CAPSULE BY MOUTH  DAILY, Disp: 90 capsule, Rfl: 3 .  ketoconazole (NIZORAL) 2 % cream, Apply 1 application topically daily., Disp: 15 g, Rfl: 0 .  ketoconazole (NIZORAL) 2 % shampoo, Apply 1 application topically 2 (two) times a week., Disp: 120 mL, Rfl: 0 .  losartan (COZAAR) 25 MG tablet, TAKE 1 TABLET BY MOUTH  TWICE DAILY, Disp: 180 tablet, Rfl: 2 .  metFORMIN (GLUCOPHAGE) 500 MG tablet, TAKE 2 TABLETS BY MOUTH  TWICE DAILY WITH MEALS, Disp: 360 tablet, Rfl: 3 .  metoprolol succinate (TOPROL-XL) 25 MG 24 hr tablet, TAKE 1 TABLET BY MOUTH  DAILY, Disp: 90 tablet, Rfl: 3 .  Multiple Vitamin (MULTIVITAMIN) tablet, Take 1 tablet by mouth daily., Disp: , Rfl:  .  nystatin (MYCOSTATIN) 100000 UNIT/ML suspension, Take 5 mLs (500,000 Units total) by mouth 4 (four) times daily. Swish and swallow, Disp: 120 mL, Rfl: 0 .  nystatin cream (MYCOSTATIN), Apply 1 application topically 2 (two) times daily., Disp: 30 g, Rfl: 0 .  ondansetron (ZOFRAN ODT) 4 MG disintegrating tablet, Take 1 tablet (4 mg total) by mouth every 8 (eight) hours as needed for nausea or vomiting., Disp: 20 tablet, Rfl: 0 .  OXYGEN, Inhale 2 L into the lungs daily. 2 liters at night, 1 liter when doing activities, and no oxygen when at home during the day., Disp: , Rfl:  .  potassium chloride (KLOR-CON) 10 MEQ tablet, TAKE 1 TABLET BY MOUTH  TWICE DAILY, Disp: 180 tablet, Rfl: 2 .  PROVENTIL HFA 108 (90 Base) MCG/ACT inhaler,  Inhale 2 puffs into the lungs every 6 (six) hours as needed for wheezing or shortness of breath., Disp: 3 Inhaler, Rfl: 1 .  rosuvastatin (CRESTOR) 20 MG tablet, TAKE 1 TABLET BY MOUTH  DAILY, Disp: 90 tablet, Rfl: 3 .  vitamin C (ASCORBIC ACID) 500 MG tablet, Take 1,000 mg by mouth daily. , Disp: , Rfl:  .  Vitamin E 180 MG CAPS, Take 1 capsule by mouth daily., Disp: , Rfl:   EXAM: This was a telephone visit and thus no physical exam was completed.  ASSESSMENT AND PLAN:  Discussed the following assessment and plan:  No problem-specific Assessment & Plan notes found for this encounter.   Orders Placed This Encounter  Procedures  . Hemoglobin A1C, fingerstick    This order was created  through External Result Entry  . Hemoglobin A1C, fingerstick    This order was created through External Result Entry    No orders of the defined types were placed in this encounter.    I discussed the assessment and treatment plan with the patient. The patient was provided an opportunity to ask questions and all were answered. The patient agreed with the plan and demonstrated an understanding of the instructions.   The patient was advised to call back or seek an in-person evaluation if the symptoms worsen or if the condition fails to improve as anticipated.  I spent 11 minutes of nonface-to-face time in this visit.  [name], MD

## 2020-03-26 NOTE — Patient Outreach (Signed)
[facility]) Care Management  03/26/2020  Martha Woodard 11/03/1965 [id number]  Received both patient and provider portion(s) of patient assistance application(s) for [facility] Faxed completed application and required documents into [employer].  Will follow up with company(ies) in 5-10 business days to check status of application(s).  Ramirez Fullbright P. Zacariah Belue, [place]  [phone]

## 2020-03-26 NOTE — Assessment & Plan Note (Signed)
Suspect COPD exacerbation versus sinus infection.  Will treat with doxycycline and prednisone.  Advised that she discuss the streaks of blood with her pulmonologist who she will be seeing soon.  Discussed that if she developed any worsening symptoms or if she had gross hemoptysis she would need to seek medical attention immediately.  She will contact us if she is not improving in the next several days.

## 2020-03-30 DIAGNOSIS — J449 Chronic obstructive pulmonary disease, unspecified: Secondary | ICD-10-CM | POA: Diagnosis not present

## 2020-04-03 ENCOUNTER — Other Ambulatory Visit: Payer: Self-pay | Admitting: Pharmacy Technician

## 2020-04-03 NOTE — Patient Outreach (Signed)
[place] [facility]) Care Management  04/03/2020  AMANDEEP HOGSTON September 14, 1966 [id number]   Care coordination call placed to [employer] in regards to patient's [facility] application.  Spoke to [place] who informed patient was APPROVED 03/30/2020-09/28/2020. The medication should arrive to the patient's homes in the next 5-7 business days.  Will follow up with patient in 5-10 business days to confirm.  Necole Minassian P. Tallyn Holroyd, [place]  [phone]

## 2020-04-05 ENCOUNTER — Ambulatory Visit
Admission: RE | Admit: 2020-04-05 | Discharge: 2020-04-05 | Disposition: A | Discharge status: Home / Self Care | Payer: Medicare Other | Source: Ambulatory Visit | Attending: Pulmonary Disease | Admitting: Pulmonary Disease

## 2020-04-05 ENCOUNTER — Encounter: Payer: Self-pay | Admitting: Pulmonary Disease

## 2020-04-05 ENCOUNTER — Other Ambulatory Visit: Payer: Self-pay

## 2020-04-05 ENCOUNTER — Ambulatory Visit
Admission: RE | Admit: 2020-04-05 | Discharge: 2020-04-05 | Disposition: A | Discharge status: Home / Self Care | Payer: Medicare Other | Attending: Pulmonary Disease | Admitting: Pulmonary Disease

## 2020-04-05 ENCOUNTER — Ambulatory Visit: Payer: Medicare Other | Admitting: Pulmonary Disease

## 2020-04-05 VITALS — BP 134/70 | HR 89 | Temp 98.5°F | Ht 66.0 in | Wt 254.8 lb

## 2020-04-05 DIAGNOSIS — J449 Chronic obstructive pulmonary disease, unspecified: Secondary | ICD-10-CM | POA: Insufficient documentation

## 2020-04-05 DIAGNOSIS — E662 Morbid (severe) obesity with alveolar hypoventilation: Secondary | ICD-10-CM

## 2020-04-05 DIAGNOSIS — J9612 Chronic respiratory failure with hypercapnia: Secondary | ICD-10-CM

## 2020-04-05 DIAGNOSIS — J0111 Acute recurrent frontal sinusitis: Secondary | ICD-10-CM

## 2020-04-05 DIAGNOSIS — R05 Cough: Secondary | ICD-10-CM | POA: Diagnosis not present

## 2020-04-05 DIAGNOSIS — J9611 Chronic respiratory failure with hypoxia: Secondary | ICD-10-CM

## 2020-04-05 DIAGNOSIS — R042 Hemoptysis: Secondary | ICD-10-CM

## 2020-04-05 MED ORDER — BREZTRI AEROSPHERE 160-9-4.8 MCG/ACT IN AERO: 2.0000 | INHALATION_SPRAY | Freq: Two times a day (BID) | RESPIRATORY_TRACT | 11 refills | Status: DC

## 2020-04-05 MED ORDER — BREZTRI AEROSPHERE 160-9-4.8 MCG/ACT IN AERO: 2.0000 | INHALATION_SPRAY | Freq: Every day | RESPIRATORY_TRACT | 0 refills | Status: AC

## 2020-04-05 MED ORDER — AZITHROMYCIN 250 MG PO TABS: ORAL_TABLET | ORAL | 0 refills | Status: AC

## 2020-04-05 NOTE — Progress Notes (Signed)
 Assessment & Plan:  1. COPD with chronic bronchitis and emphysema (HCC) (Primary) - DG Chest 2 View; Future  2. Cough with hemoptysis  3. Acute recurrent frontal sinusitis  4. Chronic respiratory failure with hypoxia and hypercapnia (HCC)  5. Obesity hypoventilation syndrome (HCC)   Patient Instructions  We are getting a chest x-ray to evaluate your cough and the coughing up blood Give you samples of the Breztri  2 puffs twice a day STOP using Dulera  and Spiriva  as Breztri  is a replacement As we discussed you have moderate to severe COPD We will see him in follow-up in 3 months time call sooner should any new problems arise We will let you know the results of your chest x-ray I am calling a Z-Pak for your sinusitis Let us  know if you are coughing up blood does not respond to this  Please note: late entry documentation due to logistical difficulties during COVID-19 pandemic. This note is filed for information purposes only, and is not intended to be used for billing, nor does it represent the full scope/nature of the visit in question. Please see any associated scanned media linked to date of encounter for additional pertinent information.  Subjective:    HPI: Martha Woodard is a 54 y.o. female presenting to the pulmonology clinic on 04/05/2020 with report of: Follow-up (review PFT- completed course of doxy and prednisone  on 04/02/2020 for sinus infection. pt reports of dry cough at times prod with yellow mucus and wheezing. )   PROBLEMS: Chronic hypoxic/hypercarbic respiratory failure Former smoker Moderate COPD [facility] CHF  Multiple pulmonary nodules - stable > 2 yrs and deemed benign   INTERVAL HISTORY: Last seen by 05/05/2017. No major pulmonary events since then  Outpatient Encounter Medications as of 04/05/2020  Medication Sig Note   aspirin  EC 81 MG tablet Take 1 tablet (81 mg total) by mouth daily.    blood glucose meter kit and supplies Dispense based on patient and  insurance preference. Use up to four times daily as directed. (FOR ICD-9 250.00, 250.01).    calcium -vitamin D  (OSCAL WITH D) 500-200 MG-UNIT tablet Take 1 tablet by mouth 2 (two) times daily.    cetirizine (ZYRTEC) 10 MG tablet Take 10 mg by mouth daily.    Multiple Vitamin (MULTIVITAMIN) tablet Take 1 tablet by mouth daily.    vitamin C (ASCORBIC ACID) 500 MG tablet Take 1,000 mg by mouth daily.     Vitamin E 180 MG CAPS Take 180 mg by mouth daily.    [DISCONTINUED] Accu-Chek Softclix Lancets lancets TEST UP TO 4 TIMES A DAY    [DISCONTINUED] albuterol  (PROVENTIL ) (2.5 MG/3ML) 0.083% nebulizer solution Take 3 mLs (2.5 mg total) by nebulization every 6 (six) hours as needed for wheezing or shortness of breath.    [DISCONTINUED] Budeson-Glycopyrrol-Formoterol  (BREZTRI  AEROSPHERE) 160-9-4.8 MCG/ACT AERO Inhale 2 puffs into the lungs 2 (two) times daily.    [DISCONTINUED] Budeson-Glycopyrrol-Formoterol  (BREZTRI  AEROSPHERE) 160-9-4.8 MCG/ACT AERO Inhale 2 puffs into the lungs 2 (two) times daily.    [DISCONTINUED] doxycycline  (VIBRA -TABS) 100 MG tablet Take 1 tablet (100 mg total) by mouth 2 (two) times daily.    [DISCONTINUED] fluticasone  (FLONASE ) 50 MCG/ACT nasal spray USE 1 OR 2 SPRAYS IN EACH NOSTRIL ONCE DAILY.    [DISCONTINUED] furosemide  (LASIX ) 20 MG tablet TAKE 1 TABLET BY MOUTH  DAILY AS NEEDED 01/16/2020: 1 daily   [DISCONTINUED] glucose blood (ACCU-CHEK AVIVA PLUS) test strip TEST UP TO 4 TIMES A DAY AS DIRECTED    [  DISCONTINUED] hydrochlorothiazide  (MICROZIDE ) 12.5 MG capsule TAKE 1 CAPSULE BY MOUTH  DAILY    [DISCONTINUED] ketoconazole  (NIZORAL ) 2 % cream Apply 1 application topically daily.    [DISCONTINUED] ketoconazole  (NIZORAL ) 2 % shampoo Apply 1 application topically 2 (two) times a week.    [DISCONTINUED] losartan  (COZAAR ) 25 MG tablet TAKE 1 TABLET BY MOUTH  TWICE DAILY    [DISCONTINUED] metFORMIN  (GLUCOPHAGE ) 500 MG tablet TAKE 2 TABLETS BY MOUTH  TWICE DAILY WITH MEALS     [DISCONTINUED] metoprolol  succinate (TOPROL -XL) 25 MG 24 hr tablet TAKE 1 TABLET BY MOUTH  DAILY    [DISCONTINUED] mometasone -formoterol  (DULERA ) 200-5 MCG/ACT AERO Inhale 2 puffs into the lungs 2 (two) times daily.    [DISCONTINUED] nystatin  (MYCOSTATIN ) 100000 UNIT/ML suspension Take 5 mLs (500,000 Units total) by mouth 4 (four) times daily. Swish and swallow (Patient not taking: Reported on 06/07/2020)    [DISCONTINUED] nystatin  cream (MYCOSTATIN ) Apply 1 application topically 2 (two) times daily.    [DISCONTINUED] ondansetron  (ZOFRAN  ODT) 4 MG disintegrating tablet Take 1 tablet (4 mg total) by mouth every 8 (eight) hours as needed for nausea or vomiting.    [DISCONTINUED] OXYGEN  Inhale 2 L into the lungs daily. 2 liters at night, 1 liter when doing activities, and no oxygen  when at home during the day.    [DISCONTINUED] potassium chloride  (KLOR-CON ) 10 MEQ tablet TAKE 1 TABLET BY MOUTH  TWICE DAILY    [DISCONTINUED] predniSONE  (DELTASONE ) 20 MG tablet Take 2 tablets (40 mg total) by mouth daily with breakfast.    [DISCONTINUED] PROVENTIL  HFA 108 (90 Base) MCG/ACT inhaler Inhale 2 puffs into the lungs every 6 (six) hours as needed for wheezing or shortness of breath.    [DISCONTINUED] rosuvastatin  (CRESTOR ) 20 MG tablet TAKE 1 TABLET BY MOUTH  DAILY    [DISCONTINUED] Tiotropium Bromide  Monohydrate (SPIRIVA  RESPIMAT) 2.5 MCG/ACT AERS Inhale 2 puffs into the lungs daily.    [EXPIRED] azithromycin  (ZITHROMAX ) 250 MG tablet Take 2 tablets (500 mg) on  Day 1,  followed by 1 tablet (250 mg) once daily on Days 2 through 5.    [EXPIRED] Budeson-Glycopyrrol-Formoterol  (BREZTRI  AEROSPHERE) 160-9-4.8 MCG/ACT AERO Inhale 2 puffs into the lungs daily for 1 day.    No facility-administered encounter medications on file as of 04/05/2020.      Objective:   Vitals:   04/05/20 1055  BP: 134/70  Pulse: 89  Temp: 98.5 F (36.9 C)  Height: 5' 6 (1.676 m)  Weight: 254 lb 12.8 oz (115.6 kg)  SpO2: 92%   TempSrc: Temporal  BMI (Calculated): 41.15     Physical exam documentation is limited by delayed entry of information.

## 2020-04-05 NOTE — Patient Instructions (Signed)
   We are getting a chest x-ray to evaluate your cough and the coughing up blood  Give you samples of the Breztri 2 puffs twice a day  STOP using Dulera and Spiriva as Judithann Sauger is a replacement  As we discussed you have moderate to severe COPD  We will see him in follow-up in 3 months time call sooner should any new problems arise  We will let you know the results of your chest x-ray  I am calling a Z-Pak for your sinusitis  Let us know if you are coughing up blood does not respond to this

## 2020-04-06 NOTE — Progress Notes (Signed)
 Assessment & Plan:  1. Chronic obstructive pulmonary disease, unspecified COPD type (HCC) (Primary) - Pulmonary Function Test [facility] Only; Future   Patient Instructions  1.  We would like to consolidate your COPD medications and switch you to Trelegy Ellipta, 1 inhalation daily.  Make sure you rinse your mouth well after use (you may use baking soda in the rinse water to help with preventing thrush).  Let me know when you get approval for this and we can send a prescription in.  2.  We will schedule breathing tests.  3.  We will see her in follow-up in 6 months time call sooner should any new breathing difficulties arise.    Please note: late entry documentation due to logistical difficulties during COVID-19 pandemic. This note is filed for information purposes only, and is not intended to be used for billing, nor does it represent the full scope/nature of the visit in question. Please see any associated scanned media linked to date of encounter for additional pertinent information.  Subjective:    HPI: Martha Woodard is a 54 y.o. female presenting to the pulmonology clinic on 08/17/2019 with report of: Follow-up (pt states breathing is fair- occ sob with exertion and dry cough at times prod with white mucus. )     Outpatient Encounter Medications as of 08/17/2019  Medication Sig   aspirin  EC 81 MG tablet Take 1 tablet (81 mg total) by mouth daily.   blood glucose meter kit and supplies Dispense based on patient and insurance preference. Use up to four times daily as directed. (FOR ICD-9 250.00, 250.01).   calcium -vitamin D  (OSCAL WITH D) 500-200 MG-UNIT tablet Take 1 tablet by mouth 2 (two) times daily.   Multiple Vitamin (MULTIVITAMIN) tablet Take 1 tablet by mouth daily.   vitamin C (ASCORBIC ACID) 500 MG tablet Take 1,000 mg by mouth daily.    Vitamin E 180 MG CAPS Take 180 mg by mouth daily.   [DISCONTINUED] ACCU-CHEK SOFTCLIX LANCETS lancets TEST UP TO 4 TIMES A DAY    [DISCONTINUED] albuterol  (PROVENTIL ) (2.5 MG/3ML) 0.083% nebulizer solution Take 3 mLs (2.5 mg total) by nebulization every 6 (six) hours as needed for wheezing or shortness of breath.   [DISCONTINUED] doxycycline  (VIBRA -TABS) 100 MG tablet Take 1 tablet (100 mg total) by mouth 2 (two) times daily.   [DISCONTINUED] fluticasone  (FLONASE ) 50 MCG/ACT nasal spray USE 1 OR 2 SPRAYS IN EACH NOSTRIL ONCE DAILY.   [DISCONTINUED] furosemide  (LASIX ) 20 MG tablet TAKE 1 TABLET BY MOUTH  DAILY AS NEEDED   [DISCONTINUED] glucose blood (ACCU-CHEK AVIVA PLUS) test strip TEST UP TO 4 TIMES A DAY AS DIRECTED   [DISCONTINUED] hydrochlorothiazide  (MICROZIDE ) 12.5 MG capsule TAKE 1 CAPSULE BY MOUTH  DAILY   [DISCONTINUED] ketoconazole  (NIZORAL ) 2 % cream Apply 1 application topically daily.   [DISCONTINUED] ketoconazole  (NIZORAL ) 2 % shampoo Apply 1 application topically 2 (two) times a week.   [DISCONTINUED] losartan  (COZAAR ) 25 MG tablet Take 1 tablet (25 mg total) by mouth 2 (two) times daily.   [DISCONTINUED] metFORMIN  (GLUCOPHAGE ) 500 MG tablet TAKE 2 TABLETS BY MOUTH  TWICE DAILY WITH MEALS   [DISCONTINUED] metoprolol  succinate (TOPROL -XL) 25 MG 24 hr tablet TAKE 1 TABLET BY MOUTH  DAILY   [DISCONTINUED] mometasone -formoterol  (DULERA ) 100-5 MCG/ACT AERO INHALE 2 PUFFS INTO LUNG THE LUNGS TWICEDAILY   [DISCONTINUED] nystatin  (MYCOSTATIN ) 100000 UNIT/ML suspension Take 5 mLs (500,000 Units total) by mouth 4 (four) times daily. Swish and swallow   [DISCONTINUED] nystatin   cream (MYCOSTATIN ) Apply 1 application topically 2 (two) times daily.   [DISCONTINUED] ondansetron  (ZOFRAN  ODT) 4 MG disintegrating tablet Take 1 tablet (4 mg total) by mouth every 8 (eight) hours as needed for nausea or vomiting.   [DISCONTINUED] OXYGEN  Inhale 2 L into the lungs daily. 2 liters at night, 1 liter when doing activities, and no oxygen  when at home during the day.   [DISCONTINUED] potassium chloride  (K-DUR) 10 MEQ tablet Take 1 tablet  (10 mEq total) by mouth 2 (two) times daily.   [DISCONTINUED] PROVENTIL  HFA 108 (90 Base) MCG/ACT inhaler Inhale 2 puffs into the lungs every 6 (six) hours as needed for wheezing or shortness of breath.   [DISCONTINUED] rosuvastatin  (CRESTOR ) 20 MG tablet Take 1 tablet (20 mg total) by mouth daily.   [DISCONTINUED] tiotropium (SPIRIVA ) 18 MCG inhalation capsule Place 1 capsule (18 mcg total) into inhaler and inhale daily.   No facility-administered encounter medications on file as of 08/17/2019.      Objective:   Vitals:   08/17/19 1004  BP: 124/82  Pulse: 96  Temp: 98 F (36.7 C)  Height: 5' 4 (1.626 m)  Weight: 245 lb (111.1 kg)  SpO2: 92%  TempSrc: Temporal  BMI (Calculated): 42.03     Physical exam documentation is limited by delayed entry of information.

## 2020-04-09 ENCOUNTER — Other Ambulatory Visit: Payer: Self-pay | Admitting: Family Medicine

## 2020-04-09 ENCOUNTER — Other Ambulatory Visit: Payer: Self-pay | Admitting: Pharmacy Technician

## 2020-04-09 DIAGNOSIS — I491 Atrial premature depolarization: Secondary | ICD-10-CM

## 2020-04-09 NOTE — Patient Outreach (Signed)
[facility]) Care Management  04/09/2020  Martha Woodard 1966/04/23 [id number]  Successful outreach call placed to patient in regards to voicemail patient left concerning Breztri with [employer].  Spoke to patient, HIPAA identifiers verified.  Patient informed she received a phone call from her local pharmacy stating that the Judithann Sauger was going to cost her approximately $95 and she was confused.  Informed patient she was approved for the patient assistance thru [employer] and that they were in the process of shipping her medication on 04/03/2020 and that it should be here this week. Informed her it seems as though the provider inadvertently called the medication into her local pharmacy not knowing patient had been approved thru the [place]. Patient informed she told the local pharmacy that she did not want the medication and would wait for it to come through the foundation. She informed provider gave her 2 samples of the Breztri.  Will follow up with [employer] concerning shipment.  Korina Tretter P. Emmilynn Marut, [place]  [phone]

## 2020-04-09 NOTE — Patient Outreach (Signed)
[facility]) Care Management  04/09/2020  ALIVIA CIMINO January 21, 1966 [id number]  ADDENDUM  Care coordination call placed to [employer] in regards to delivery status of patient's Breztri.  Spoke to [place] who informed it should be delivered on Wednesday April 11, 2020. The tracking number is [phone] via [place].  Will follow up with patient as previously scheduled.  Eulises Kijowski P. Doyce Stonehouse, [place]  [phone]

## 2020-04-13 ENCOUNTER — Other Ambulatory Visit: Payer: Self-pay | Admitting: Pharmacy Technician

## 2020-04-13 NOTE — Patient Outreach (Signed)
[facility]) Care Management  04/13/2020  Martha Woodard 02-29-1966 [id number]    Successful call placed to patient regarding patient assistance medication delivery of Breztri from [employer], HIPAA identifiers verified.   Patient informed she received the medication. Discussed refill procedure with patient which will require patient to call [employer] if they do no auto ship the medication. Informed patient that auto shipment is new with this company so if she has opened her last box and has not heard about her refills then she will need to call them. Informed her where to find the phone number on the pharmacy label or in the paperwork. Patient verbalized understanding.Confirmed patient had name and number.   Follow up:  Will route note to embedded [place] for case closure as patient assistance is completed and will remove myself from care team.  Luiz Ochoa. Leyland Ken[place], [place]  [phone]

## 2020-04-20 ENCOUNTER — Telehealth: Payer: Self-pay | Admitting: Pulmonary Disease

## 2020-04-20 MED ORDER — CLOTRIMAZOLE 10 MG MT TROC
10.0000 mg | Freq: Every day | OROMUCOSAL | 1 refills | Status: DC
Start: 2020-04-20 — End: 2020-06-07

## 2020-04-20 NOTE — Telephone Encounter (Signed)
Called and spoke to pt.  Pt feels that Martha Woodard has caused thrush. Pt is rinsing her mouth with water mixed with a little baking soda after every use.  PCP prescribed nystatin which helped sooth her mouth, however she is out and would like a refill.    Dr. Patsey Berthold, please advise. Thanks

## 2020-04-20 NOTE — Telephone Encounter (Signed)
We can switch her Breztri to [place] which she does not have the steroid but still has the bronchodilator and the extra ingredient.  This can be obtained through [employer] we can send the request to the medication assistance program.  Ritta Slot recommend to try Chlortrimazole troches (10 mg) 1 Troche dissolved in mouth 5 times a day for 7 days may give 1 refill.

## 2020-04-20 NOTE — Telephone Encounter (Signed)
Bevespi is 2 puffs twice a day just like Breztri.

## 2020-04-20 NOTE — Telephone Encounter (Signed)
Lm for pt

## 2020-04-20 NOTE — Telephone Encounter (Signed)
Spoke to pt and relayed below message/recommendations.  Rx for Clortrimazole has been sent to preferred pharmacy. Pt would like to fit try Clortrimazole before switching inhalers, as she feels that Judithann Sauger works well for her. Nothing further is needed at this time.  Will route to Dr. Patsey Berthold as an [place].

## 2020-04-23 ENCOUNTER — Telehealth: Payer: Self-pay | Admitting: Pulmonary Disease

## 2020-04-23 ENCOUNTER — Other Ambulatory Visit: Payer: Self-pay | Admitting: Pharmacy Technician

## 2020-04-23 MED ORDER — FLUCONAZOLE 100 MG PO TABS
100.0000 mg | ORAL_TABLET | Freq: Every day | ORAL | 0 refills | Status: AC
Start: 2020-04-23 — End: 2020-05-23

## 2020-04-23 MED ORDER — BEVESPI AEROSPHERE 9-4.8 MCG/ACT IN AERO: 2.0000 | INHALATION_SPRAY | Freq: Two times a day (BID) | RESPIRATORY_TRACT | 11 refills | Status: DC

## 2020-04-23 NOTE — Telephone Encounter (Signed)
Pt is aware of recommendations and voiced her understanding.  Rx for fluconazole 100 has been sent to preferred pharmacy.  Nothing further is needed.

## 2020-04-23 NOTE — Telephone Encounter (Signed)
Clotrimazole is stronger than Nystatin. I recommend Fluconazole 100mg  PO daily X 14d. If the "thrush" persists she will need eval by primary MD as it may not be thrush after all.

## 2020-04-23 NOTE — Patient Outreach (Signed)
[place] [facility] [facility]) Care Management  04/23/2020  BRAXTON WEISBECKER 1966-02-05 [id number]  Returned call placed to patient in regards to voicemail she left me on Saturday regarding Breztri inhaler causing thrush like symptoms that she gets from [employer].  Spoke to patient, HIPAA identifiers verified.  Patient informed her provider called her in Clotrimazole troches but she informes it does not work very well. She informs it works for the amount of time it dissolves in the mouth and that is yet. She informs she has been swishing with baking soda and Listerine. Patient informs  that has helped some but informs that her mouth is not back to normal.  She also informs she has not used the [employer] inhaler since Saturday and has gone back to using [facility] which she had some left over. She informed Dr. Ronalee Belts offered to switch to another medication in which she could get from [employer] but patient had wanted to see if the thrush could get resolved as the [place] worked well.  Informed patient to call Dr. Domingo Dimes office and provide an update on how she feels today. Patient informed she would do that this morning. Will route this note to embedded RPh as FYI.  Tylin Stradley P. Dionel Archey, [place]  [phone]

## 2020-04-23 NOTE — Telephone Encounter (Signed)
Please see 04/20/2020 phone note.  Pt would like to switch to Bevespi. Rx for Martha Woodard has been sent to preferred pharmacy. Pt stated that Clotrimazole is not helping with her thrush. She would like nystatin as it worked the best.  Dr. Patsey Berthold please advise. Thanks

## 2020-04-24 ENCOUNTER — Ambulatory Visit
Admission: RE | Admit: 2020-04-24 | Discharge: 2020-04-24 | Disposition: A | Discharge status: Home / Self Care | Payer: Medicare Other | Source: Ambulatory Visit | Attending: Family Medicine | Admitting: Family Medicine

## 2020-04-24 ENCOUNTER — Encounter: Payer: Self-pay | Admitting: Radiology

## 2020-04-24 DIAGNOSIS — Z1231 Encounter for screening mammogram for malignant neoplasm of breast: Secondary | ICD-10-CM | POA: Insufficient documentation

## 2020-04-25 ENCOUNTER — Other Ambulatory Visit: Payer: Self-pay | Admitting: Pharmacy Technician

## 2020-04-25 ENCOUNTER — Telehealth: Payer: Self-pay

## 2020-04-25 ENCOUNTER — Ambulatory Visit: Payer: Self-pay | Admitting: Pharmacist

## 2020-04-25 DIAGNOSIS — E1159 Type 2 diabetes mellitus with other circulatory complications: Secondary | ICD-10-CM

## 2020-04-25 MED ORDER — BEVESPI AEROSPHERE 9-4.8 MCG/ACT IN AERO: 2.0000 | INHALATION_SPRAY | Freq: Two times a day (BID) | RESPIRATORY_TRACT | 11 refills | Status: DC

## 2020-04-25 NOTE — Telephone Encounter (Signed)
Received epic secure message from [place], [employer] Keri stated that medication management would be unable to assist pt with Bevespi, as pt is not uninsured or in medicare gap. I spoke with pt and verified  that she is not in the medicare gap.  Pt stated that she worked with Sharee Pimple previously with obtaining medication at no cost. Pt feels that Sharee Pimple is with med management, but is unsure.  Pt will contact Sharee Pimple and have her fax over Rx and necessary paperwork to be completed by our office. Nothing further is needed at this time.

## 2020-04-25 NOTE — Addendum Note (Signed)
Addended by: Claudette Head A on: 04/25/2020 09:55 AM   Modules accepted: Orders

## 2020-04-25 NOTE — Telephone Encounter (Signed)
Patient received Breztri through [employer] patient assistance. I called [facility] [facility] to let them know we were switching to Linna Hoff, could you please fax the prescription to [facility] at [phone]?  Thanks!  [name], PharmD, [place], CPP Clinical Pharmacist [place] [phone]

## 2020-04-25 NOTE — Chronic Care Management (AMB) (Signed)
Chronic Care Management   Follow Up Note   04/25/2020 Name: Martha Woodard MRN: [id number] DOB: [birth year]  Referred by: Leone Haven, MD Reason for referral : Chronic Care Management (Medication Management)   Martha Woodard is a 54 y.o. year old female who is a primary care patient of Caryl Bis, Angela Adam, MD. The CCM team was consulted for assistance with chronic disease management and care coordination needs.    Care coordination completed today.   Review of patient status, including review of consultants reports, relevant laboratory and other test results, and collaboration with appropriate care team members and the patient's provider was performed as part of comprehensive patient evaluation and provision of chronic care management services.    SDOH (Social Determinants of Health) assessments performed: Yes See Care Plan activities for detailed interventions related to [facility])     Outpatient Encounter Medications as of 04/25/2020  Medication Sig Note  . Accu-Chek Softclix Lancets lancets TEST UP TO 4 TIMES A DAY   . albuterol (PROVENTIL) (2.5 MG/3ML) 0.083% nebulizer solution Take 3 mLs (2.5 mg total) by nebulization every 6 (six) hours as needed for wheezing or shortness of breath.   Marland Kitchen aspirin EC 81 MG tablet Take 1 tablet (81 mg total) by mouth daily.   . blood glucose meter kit and supplies Dispense based on patient and insurance preference. Use up to four times daily as directed. (FOR ICD-9 250.00, 250.01).   . calcium-vitamin D (OSCAL WITH D) 500-200 MG-UNIT tablet Take 1 tablet by mouth 2 (two) times daily.   . cetirizine (ZYRTEC) 10 MG tablet Take 10 mg by mouth daily.   . clotrimazole (MYCELEX) 10 MG troche Take 1 tablet (10 mg total) by mouth 5 (five) times daily.   . fluconazole (DIFLUCAN) 100 MG tablet Take 1 tablet (100 mg total) by mouth daily.   . fluticasone (FLONASE) 50 MCG/ACT nasal spray USE 1 OR 2 SPRAYS IN EACH NOSTRIL ONCE DAILY.   . furosemide (LASIX) 20 MG  tablet TAKE 1 TABLET BY MOUTH  DAILY AS NEEDED 01/16/2020: 1 daily  . glucose blood (ACCU-CHEK AVIVA PLUS) test strip TEST UP TO 4 TIMES A DAY AS DIRECTED   . Glycopyrrolate-Formoterol (BEVESPI AEROSPHERE) 9-4.8 MCG/ACT AERO Inhale 2 puffs into the lungs 2 (two) times daily.   . hydrochlorothiazide (MICROZIDE) 12.5 MG capsule TAKE 1 CAPSULE BY MOUTH  DAILY   . ketoconazole (NIZORAL) 2 % cream Apply 1 application topically daily.   Marland Kitchen ketoconazole (NIZORAL) 2 % shampoo Apply 1 application topically 2 (two) times a week.   . losartan (COZAAR) 25 MG tablet TAKE 1 TABLET BY MOUTH  TWICE DAILY   . metFORMIN (GLUCOPHAGE) 500 MG tablet TAKE 2 TABLETS BY MOUTH  TWICE DAILY WITH MEALS   . metoprolol succinate (TOPROL-XL) 25 MG 24 hr tablet TAKE 1 TABLET BY MOUTH  DAILY   . Multiple Vitamin (MULTIVITAMIN) tablet Take 1 tablet by mouth daily.   Marland Kitchen nystatin (MYCOSTATIN) 100000 UNIT/ML suspension Take 5 mLs (500,000 Units total) by mouth 4 (four) times daily. Swish and swallow   . nystatin cream (MYCOSTATIN) Apply 1 application topically 2 (two) times daily. 01/16/2020: PRN under breast  . ondansetron (ZOFRAN ODT) 4 MG disintegrating tablet Take 1 tablet (4 mg total) by mouth every 8 (eight) hours as needed for nausea or vomiting.   . OXYGEN Inhale 2 L into the lungs daily. 2 liters at night, 1 liter when doing activities, and no oxygen when at home during the  day.   . potassium chloride (KLOR-CON) 10 MEQ tablet TAKE 1 TABLET BY MOUTH  TWICE DAILY   . PROVENTIL HFA 108 (90 Base) MCG/ACT inhaler Inhale 2 puffs into the lungs every 6 (six) hours as needed for wheezing or shortness of breath.   . rosuvastatin (CRESTOR) 20 MG tablet TAKE 1 TABLET BY MOUTH  DAILY   . vitamin C (ASCORBIC ACID) 500 MG tablet Take 1,000 mg by mouth daily.    . Vitamin E 180 MG CAPS Take 1 capsule by mouth daily.    No facility-administered encounter medications on file as of 04/25/2020.     Objective:   Goals Addressed                This Visit's Progress     Patient Stated   .  "I want to stay healthy" (pt-stated)        CARE PLAN ENTRY (see longtitudinal plan of care for additional care plan information)  Current Barriers:  . Social, financial, and community barriers:  o No financial barriers at this time . Polypharmacy; complex patient with multiple comorbidities including T2DM, CHF, COPD, HTN, HLD o COPD: Pt started on Breztri via patient assistance, however, patient developed thrush. Pulmonary recommended switch to Bevespi.  - Patient APPROVED for [place] assistance for Breztri through 09/28/20 - Most recent PFT: severe obstructive lung disease w/ little post bronchodilator reversibility o HTN/CHF: HCTZ 12.5 mg daily, losartan 25 mg twice daily, metoprolol succinate 25 mg daily, furosemide 20 mg PRN o HLD: rosuvastatin 20 mg daily; LDL at goal <70 o T2DM: metformin 1000 mg BID; A1c at goal <7% o Topicals: nystatin suspension PRN thrush o Ketoconazole shampoo -uses PRN for scalp breakouts   Pharmacist Clinical Goal(s):  Marland Kitchen Over the next 90 days, patient will work with PharmD and provider towards optimized medication management  Interventions: . [place], updated that Dr. Domingo Dimes team would be sending script for Charolotte Eke to replace Breztri. Collaborated w/ CMA to have script faxed to [place].   Patient Self Care Activities:  . Patient will take medications as prescribed  Please see past updates related to this goal by clicking on the "Past Updates" button in the selected goal          Plan:  - Will outreach patient as previously scheduled  [name], PharmD, [place], [place] Pharmacist [place] [phone]

## 2020-04-25 NOTE — Telephone Encounter (Addendum)
Rx for Charolotte Eke has been printed and placed in Dr. Domingo Dimes folder for signature.

## 2020-04-25 NOTE — Telephone Encounter (Signed)
I have attempted to fax Rx for [phone] three times and received "no answer".  [place] do you have another fax number for [employer]?

## 2020-04-25 NOTE — Telephone Encounter (Signed)
Rx for Bevespi has been sent to [facility] at provided fax number. Nothing further is needed.

## 2020-04-25 NOTE — Telephone Encounter (Signed)
[phone] is the only fax number on their website. Sharee Pimple, can you call tomorrow and see if there is another one, or see if their fax was down today? Thanks!

## 2020-04-25 NOTE — Patient Outreach (Signed)
[facility]) Care Management  04/25/2020  Martha Woodard 1965-10-16 [id number]   Incoming call received from patient concerning a change in her inhaler from [place] to [place] with [employer].  Spoke to patient, HIPAA identifiers verified.  Patient informs the provider's office called the inhaler into Medication Management in [place] instead of the [employer] patient assistance foundation and patient was inquiring if I could assist. Reached out to embedded RPh [name] to assist in sending this inhaler to [employer].  Informed patient it would take approximately 5-7 business days for the new inhaler to arrive to her home.  Martha Woodard, [place]  [phone]

## 2020-04-25 NOTE — Patient Instructions (Signed)
Visit Information  Goals Addressed              This Visit's Progress     Patient Stated   .  "I want to stay healthy" (pt-stated)        CARE PLAN ENTRY (see longtitudinal plan of care for additional care plan information)  Current Barriers:  . Social, financial, and community barriers:  o No financial barriers at this time . Polypharmacy; complex patient with multiple comorbidities including T2DM, CHF, COPD, HTN, HLD o COPD: Pt started on Breztri via patient assistance, however, patient developed thrush. Pulmonary recommended switch to Bevespi.  - Patient APPROVED for [place] assistance for Breztri through 09/28/20 - Most recent PFT: severe obstructive lung disease w/ little post bronchodilator reversibility o HTN/CHF: HCTZ 12.5 mg daily, losartan 25 mg twice daily, metoprolol succinate 25 mg daily, furosemide 20 mg PRN o HLD: rosuvastatin 20 mg daily; LDL at goal <70 o T2DM: metformin 1000 mg BID; A1c at goal <7% o Topicals: nystatin suspension PRN thrush o Ketoconazole shampoo -uses PRN for scalp breakouts   Pharmacist Clinical Goal(s):  Marland Kitchen Over the next 90 days, patient will work with PharmD and provider towards optimized medication management  Interventions: . [place], updated that Dr. Domingo Dimes team would be sending script for Charolotte Eke to replace Breztri. Collaborated w/ CMA to have script faxed to [place].   Patient Self Care Activities:  . Patient will take medications as prescribed  Please see past updates related to this goal by clicking on the "Past Updates" button in the selected goal         The patient verbalized understanding of instructions provided today and declined a print copy of patient instruction materials.      Plan:  - Will outreach patient as previously scheduled  [name], PharmD, [place], [place] Pharmacist [place] [phone]

## 2020-04-26 ENCOUNTER — Other Ambulatory Visit: Payer: Self-pay | Admitting: Family Medicine

## 2020-04-26 NOTE — Telephone Encounter (Signed)
Prescription has been faxed to [facility] at [phone]. Received successful fax confirmation.  Nothing further is needed at this time.

## 2020-04-30 DIAGNOSIS — J449 Chronic obstructive pulmonary disease, unspecified: Secondary | ICD-10-CM | POA: Diagnosis not present

## 2020-05-10 ENCOUNTER — Ambulatory Visit: Payer: Medicare Other | Admitting: Pharmacist

## 2020-05-10 DIAGNOSIS — E1159 Type 2 diabetes mellitus with other circulatory complications: Secondary | ICD-10-CM

## 2020-05-10 DIAGNOSIS — J439 Emphysema, unspecified: Secondary | ICD-10-CM

## 2020-05-10 NOTE — Patient Instructions (Signed)
Visit Information  Goals Addressed              This Visit's Progress     Patient Stated   .  "I want to stay healthy" (pt-stated)        CARE PLAN ENTRY (see longtitudinal plan of care for additional care plan information)  Current Barriers:  . Social, financial, and community barriers:  o Working on Adult nurse . Polypharmacy; complex patient with multiple comorbidities including T2DM, CHF, COPD, HTN, HLD o COPD: Pt started on Breztri via patient assistance, however, patient developed thrush. Pulmonary recommended switch to Bevespi. Script sent 04/26/20 - Patient APPROVED for [place] assistance for Breztri through 09/28/20 - Most recent PFT: severe obstructive lung disease w/ little post bronchodilator reversibility o HTN/CHF: HCTZ 12.5 mg daily, losartan 25 mg twice daily, metoprolol succinate 25 mg daily, furosemide 20 mg PRN o HLD: rosuvastatin 20 mg daily; LDL at goal <70 o T2DM: metformin 1000 mg BID; A1c at goal <7% o Topicals: nystatin suspension PRN thrush o Ketoconazole shampoo -uses PRN for scalp breakouts   Pharmacist Clinical Goal(s):  Marland Kitchen Over the next 90 days, patient will work with PharmD and provider towards optimized medication management  Interventions: . Comprehensive medication review performed, medication list updated in electronic medical record . Inter-disciplinary care team collaboration (see longitudinal plan of care) . [place]. They received script on 04/26/20, but do not automatically fill scripts. They will fill today, ship to patient and arrive in the next 7-10 business days.  Marland Kitchen Contacted patient, informed of the above. She verbalized understanding  Patient Self Care Activities:  . Patient will take medications as prescribed  Please see past updates related to this goal by clicking on the "Past Updates" button in the selected goal         The patient verbalized understanding of instructions provided today and declined a print  copy of patient instruction materials.   Plan:  - Will outreach as previously scheduled  [name], PharmD, [place], [place] Pharmacist [place] [phone]

## 2020-05-10 NOTE — Chronic Care Management (AMB) (Signed)
Chronic Care Management   Follow Up Note   05/10/2020 Name: GINAMARIE BANFIELD MRN: [id number] DOB: 11/03/1965  Referred by: Leone Haven, MD Reason for referral : Chronic Care Management (Medication Management)   Martha Woodard is a 54 y.o. year old female who is a primary care patient of Caryl Bis, Angela Adam, MD. The CCM team was consulted for assistance with chronic disease management and care coordination needs.    Patient contacted me to f/u on Bevespi script from [employer] patient assistance  Review of patient status, including review of consultants reports, relevant laboratory and other test results, and collaboration with appropriate care team members and the patient's provider was performed as part of comprehensive patient evaluation and provision of chronic care management services.    SDOH (Social Determinants of Health) assessments performed: Yes See Care Plan activities for detailed interventions related to SDOH)  SDOH Interventions     Most Recent Value  SDOH Interventions  Financial Strain Interventions Other (Comment)  [Medication assistance]       Outpatient Encounter Medications as of 05/10/2020  Medication Sig Note   Accu-Chek Softclix Lancets lancets TEST UP TO 4 TIMES A DAY    albuterol (PROVENTIL) (2.5 MG/3ML) 0.083% nebulizer solution Take 3 mLs (2.5 mg total) by nebulization every 6 (six) hours as needed for wheezing or shortness of breath.    aspirin EC 81 MG tablet Take 1 tablet (81 mg total) by mouth daily.    blood glucose meter kit and supplies Dispense based on patient and insurance preference. Use up to four times daily as directed. (FOR ICD-9 250.00, 250.01).    calcium-vitamin D (OSCAL WITH D) 500-200 MG-UNIT tablet Take 1 tablet by mouth 2 (two) times daily.    cetirizine (ZYRTEC) 10 MG tablet Take 10 mg by mouth daily.    clotrimazole (MYCELEX) 10 MG troche Take 1 tablet (10 mg total) by mouth 5 (five) times daily.    fluconazole  (DIFLUCAN) 100 MG tablet Take 1 tablet (100 mg total) by mouth daily.    fluticasone (FLONASE) 50 MCG/ACT nasal spray USE 1 OR 2 SPRAYS IN EACH NOSTRIL ONCE DAILY.    furosemide (LASIX) 20 MG tablet TAKE 1 TABLET BY MOUTH  DAILY AS NEEDED 01/16/2020: 1 daily   glucose blood (ACCU-CHEK AVIVA PLUS) test strip TEST UP TO 4 TIMES DAILY AS DIRECTED    Glycopyrrolate-Formoterol (BEVESPI AEROSPHERE) 9-4.8 MCG/ACT AERO Inhale 2 puffs into the lungs 2 (two) times daily.    hydrochlorothiazide (MICROZIDE) 12.5 MG capsule TAKE 1 CAPSULE BY MOUTH  DAILY    ketoconazole (NIZORAL) 2 % cream Apply 1 application topically daily.    ketoconazole (NIZORAL) 2 % shampoo Apply 1 application topically 2 (two) times a week.    losartan (COZAAR) 25 MG tablet TAKE 1 TABLET BY MOUTH  TWICE DAILY    metFORMIN (GLUCOPHAGE) 500 MG tablet TAKE 2 TABLETS BY MOUTH  TWICE DAILY WITH MEALS    metoprolol succinate (TOPROL-XL) 25 MG 24 hr tablet TAKE 1 TABLET BY MOUTH  DAILY    Multiple Vitamin (MULTIVITAMIN) tablet Take 1 tablet by mouth daily.    nystatin (MYCOSTATIN) 100000 UNIT/ML suspension Take 5 mLs (500,000 Units total) by mouth 4 (four) times daily. Swish and swallow    nystatin cream (MYCOSTATIN) Apply 1 application topically 2 (two) times daily. 01/16/2020: PRN under breast   ondansetron (ZOFRAN ODT) 4 MG disintegrating tablet Take 1 tablet (4 mg total) by mouth every 8 (eight) hours as needed for  nausea or vomiting.    OXYGEN Inhale 2 L into the lungs daily. 2 liters at night, 1 liter when doing activities, and no oxygen when at home during the day.    potassium chloride (KLOR-CON) 10 MEQ tablet TAKE 1 TABLET BY MOUTH  TWICE DAILY    PROVENTIL HFA 108 (90 Base) MCG/ACT inhaler Inhale 2 puffs into the lungs every 6 (six) hours as needed for wheezing or shortness of breath.    rosuvastatin (CRESTOR) 20 MG tablet TAKE 1 TABLET BY MOUTH  DAILY    vitamin C (ASCORBIC ACID) 500 MG tablet Take 1,000 mg by mouth  daily.     Vitamin E 180 MG CAPS Take 1 capsule by mouth daily.    No facility-administered encounter medications on file as of 05/10/2020.     Objective:   Goals Addressed              This Visit's Progress     Patient Stated     "I want to stay healthy" (pt-stated)        CARE PLAN ENTRY (see longtitudinal plan of care for additional care plan information)  Current Barriers:   Social, financial, and community barriers:  o Working on Adult nurse  Polypharmacy; complex patient with multiple comorbidities including T2DM, CHF, COPD, HTN, HLD o COPD: Pt started on Breztri via patient assistance, however, patient developed thrush. Pulmonary recommended switch to Bevespi. Script sent 04/26/20 - Patient APPROVED for [place] assistance for Breztri through 09/28/20 - Most recent PFT: severe obstructive lung disease w/ little post bronchodilator reversibility o HTN/CHF: HCTZ 12.5 mg daily, losartan 25 mg twice daily, metoprolol succinate 25 mg daily, furosemide 20 mg PRN o HLD: rosuvastatin 20 mg daily; LDL at goal <70 o T2DM: metformin 1000 mg BID; A1c at goal <7% o Topicals: nystatin suspension PRN thrush o Ketoconazole shampoo -uses PRN for scalp breakouts   Pharmacist Clinical Goal(s):   Over the next 90 days, patient will work with PharmD and provider towards optimized medication management  Interventions:  Comprehensive medication review performed, medication list updated in electronic medical record  Inter-disciplinary care team collaboration (see longitudinal plan of care)  [place]. They received script on 04/26/20, but do not automatically fill scripts. They will fill today, ship to patient and arrive in the next 7-10 business days.   Contacted patient, informed of the above. She verbalized understanding  Patient Self Care Activities:   Patient will take medications as prescribed  Please see past updates related to this goal by clicking on the  "Past Updates" button in the selected goal          Plan:  - Will outreach as previously scheduled  [name], PharmD, [place], [place] Pharmacist [place] [phone]

## 2020-05-18 ENCOUNTER — Other Ambulatory Visit: Payer: Self-pay | Admitting: Pharmacy Technician

## 2020-05-18 NOTE — Patient Outreach (Signed)
[place] [facility]) Care Management  05/18/2020  Martha Woodard [birth year] [id number]  Received message form embedded [facility] RPh [name] that patient has not received Bevespi from the [employer]ME patient assistance company.  Care coordination call placed to [employer]. Spoke to [place]. [name] informed that the medication was ordered and shipped. She also informed the medication is due to be delivered on 05/21/2020.  Will outreach patient with this information.  Martha Woodard, [place]  [phone]

## 2020-05-18 NOTE — Patient Outreach (Signed)
Martha Woodard) Care Management  05/18/2020  Martha Woodard [birth year] [id number]  ADDENDUM  Successful outreach call placed to patient in regards to the delivery of Bevespi from [place] patient assistance company.  Spoke to patient, HIPAA identifiers verified.  Informed patient that [name] from [employer] informed that her inhalers would be delivered tomorrow. Informed patient to contact me Monday 05/21/2020 if they do not arrive. Patient verbalized understanding.  Geraline Halberstadt P. Trica Usery, [place]  [phone]

## 2020-05-21 ENCOUNTER — Other Ambulatory Visit: Payer: Self-pay | Admitting: Pharmacy Technician

## 2020-05-21 NOTE — Patient Outreach (Signed)
[place] [facility]) Care Management  05/21/2020  ARYANI DAFFERN 1966-01-30 [id number]  ADDENDUM  Care coordination call placed to [employer] in regards to [facility] delivery.  Spoke to [place] who informed the medication is out for delivery by 7pm today. The tracking number for [place] is  [phone].  Reached out via in basket message to embedded [place] to inquire about samples from PCP or Pulmonologist office. Per embedded RPh neither clinic had samples. Other alternatives is that patient could pick up 1 inhaler from her local pharmacy though cost is unknown or patient could re start Judithann Sauger though it did cause thrush and to make sure patient knows to rinse mouth thoroughly after use.  Will outreach patient with this information.  Kenadie Royce P. Ninfa Giannelli, [place]  [phone]

## 2020-05-21 NOTE — Patient Outreach (Signed)
[place] [facility]) Care Management  05/21/2020  SUN KIHN August 19, 1966 [id number]  ADDENDUM  Successful outreach call placed to patient in regards to [facility] delivery with [employer].  Spoke to patient, HIPAA identifiers verified.  Informed patient that the tracking information indicates the medication should arrive today by 7pm with  [employer].   Provided patient options if medication does not arrive such as picking up a 1 month supply from the pharmacy or re starting Judithann Sauger making sure patient knows to rinse mouth thoroughly right after use to avoid thrush again. Patient informed she was not sure she could afford to pick up a supply and was hesitant to re start the Breztri due to the thrush she had before. Informed her that the  PCP office and Pulmonologist office did not have samples.  She informed she would wait and see if the medication arrived today. Confirmed patient had name and number.  Darral Rishel P. Yer [place], [place]  [phone]

## 2020-05-21 NOTE — Patient Outreach (Signed)
[facility]) Care Management  05/21/2020  Martha Woodard 1966/07/03 [id number]  Incoming call received from patient in regards to [facility] delivery from [place].  Spoke to patient, HIPAA identifiers verified.  Patient informs she did not receive the Bevespi on Saturday as [employer] representative [name] had informed would arrive.  Informed patient I would outreach [employer] to inquire about a new delivery date but unfortunately we are at the mercy of the postal service. Patient verbalized understanding.  Mitzie Marlar P. Zeynep Fantroy, [place]  [phone]

## 2020-05-23 ENCOUNTER — Ambulatory Visit (INDEPENDENT_AMBULATORY_CARE_PROVIDER_SITE_OTHER): Payer: Medicare Other | Admitting: Pharmacist

## 2020-05-23 ENCOUNTER — Telehealth: Payer: Medicare Other | Admitting: Family Medicine

## 2020-05-23 ENCOUNTER — Encounter: Payer: Self-pay | Admitting: Family Medicine

## 2020-05-23 ENCOUNTER — Other Ambulatory Visit: Payer: Self-pay

## 2020-05-23 DIAGNOSIS — J432 Centrilobular emphysema: Secondary | ICD-10-CM | POA: Diagnosis not present

## 2020-05-23 DIAGNOSIS — E1159 Type 2 diabetes mellitus with other circulatory complications: Secondary | ICD-10-CM | POA: Diagnosis not present

## 2020-05-23 DIAGNOSIS — J439 Emphysema, unspecified: Secondary | ICD-10-CM | POA: Diagnosis not present

## 2020-05-23 DIAGNOSIS — J441 Chronic obstructive pulmonary disease with (acute) exacerbation: Secondary | ICD-10-CM

## 2020-05-23 MED ORDER — PREDNISONE 20 MG PO TABS: 40.0000 mg | ORAL_TABLET | Freq: Every day | ORAL | 0 refills | Status: DC

## 2020-05-23 NOTE — Progress Notes (Signed)
Virtual Visit via telephone Note  This visit type was conducted due to national recommendations for restrictions regarding the COVID-19 pandemic (e.g. social distancing).  This format is felt to be most appropriate for this patient at this time.  All issues noted in this document were discussed and addressed.  No physical exam was performed (except for noted visual exam findings with Video Visits).   I connected with Martha Woodard today at  4:30 PM EDT by telephone and verified that I am speaking with the correct person using two identifiers. Location patient: home Location provider: work Persons participating in the virtual visit: patient, provider  I discussed the limitations, risks, security and privacy concerns of performing an evaluation and management service by telephone and the availability of in person appointments. I also discussed with the patient that there may be a patient responsible charge related to this service. The patient expressed understanding and agreed to proceed.  Interactive audio and video telecommunications were attempted between this provider and patient, however failed, due to patient having technical difficulties OR patient did not have access to video capability.  We continued and completed visit with audio only.   Reason for visit: same day   HPI: COPD exacerbation: Patient notes symptoms started in the last week or so.  She ran out of her inhalers 2 to 3 weeks ago and just recently started on Bevespi.  She has had cough, wheezing, and some shortness of breath.  Notes the shortness of breath is improved with the Bevespi.  No fevers.  No chest pain.  No taste or smell disturbances.  The albuterol had helped with her wheezing.  She is fully vaccinated against Covid.  She notes she has not left her house in 3 weeks and has not been around anybody else in the past 3 weeks.   ROS: See pertinent positives and negatives per HPI.  Past Medical History:  Diagnosis Date   . CHF (congestive heart failure) ([place])   . Colon polyp 01-01-2016  . COPD (chronic obstructive pulmonary disease) ([place])   . Diabetes mellitus without complication ([place])   . Diastolic heart failure ([place])   . HLD (hyperlipidemia)   . Hypertension   . Morbid obesity with BMI of 45.0-49.9, adult ([place])   . Multiple lung nodules on CT   . Shortness of breath dyspnea     Past Surgical History:  Procedure Laterality Date  . BIOPSY THYROID Right 02-06-14   NEGATIVE FOR MALIGNANT CELLS proteinaceous material and macrophages.  Marland Kitchen BREAST BIOPSY Left    neg- core  . COLONOSCOPY WITH PROPOFOL N/A 01/01/2016   Procedure: COLONOSCOPY WITH PROPOFOL;  Surgeon: Lucilla Lame, MD;  Location: [facility] ENDOSCOPY;  Service: Endoscopy;  Laterality: N/A;  . thryoid fna Right April 2015   Proteinaceous material and macrophages.  . WISDOM TOOTH EXTRACTION      Family History  Problem Relation Age of Onset  . Cancer Mother 36       uterian and ovarian   . Goiter Mother   . Stroke Father   . Breast cancer Sister 102  . Stroke Brother   . Breast cancer Sister 73  . Heart murmur Sister     SOCIAL HX: Former smoker   Current Outpatient Medications:  .  Accu-Chek Softclix Lancets lancets, TEST UP TO 4 TIMES A DAY, Disp: 200 each, Rfl: 3 .  albuterol (PROVENTIL) (2.5 MG/3ML) 0.083% nebulizer solution, Take 3 mLs (2.5 mg total) by nebulization every 6 (six) hours as needed  for wheezing or shortness of breath., Disp: 75 mL, Rfl: 12 .  aspirin EC 81 MG tablet, Take 1 tablet (81 mg total) by mouth daily., Disp: 90 tablet, Rfl: 3 .  blood glucose meter kit and supplies, Dispense based on patient and insurance preference. Use up to four times daily as directed. (FOR ICD-9 250.00, 250.01)., Disp: 1 each, Rfl: 0 .  calcium-vitamin D (OSCAL WITH D) 500-200 MG-UNIT tablet, Take 1 tablet by mouth 2 (two) times daily., Disp: , Rfl:  .  cetirizine (ZYRTEC) 10 MG tablet, Take 10 mg by mouth daily., Disp: , Rfl:  .  clotrimazole  (MYCELEX) 10 MG troche, Take 1 tablet (10 mg total) by mouth 5 (five) times daily., Disp: 35 Troche, Rfl: 1 .  fluconazole (DIFLUCAN) 100 MG tablet, Take 1 tablet (100 mg total) by mouth daily., Disp: 14 tablet, Rfl: 0 .  fluticasone (FLONASE) 50 MCG/ACT nasal spray, USE 1 OR 2 SPRAYS IN EACH NOSTRIL ONCE DAILY., Disp: 16 g, Rfl: 12 .  furosemide (LASIX) 20 MG tablet, TAKE 1 TABLET BY MOUTH  DAILY AS NEEDED, Disp: 90 tablet, Rfl: 2 .  glucose blood (ACCU-CHEK AVIVA PLUS) test strip, TEST UP TO 4 TIMES DAILY AS DIRECTED, Disp: 100 strip, Rfl: 13 .  Glycopyrrolate-Formoterol (BEVESPI AEROSPHERE) 9-4.8 MCG/ACT AERO, Inhale 2 puffs into the lungs 2 (two) times daily., Disp: 10.7 g, Rfl: 11 .  hydrochlorothiazide (MICROZIDE) 12.5 MG capsule, TAKE 1 CAPSULE BY MOUTH  DAILY, Disp: 90 capsule, Rfl: 3 .  ketoconazole (NIZORAL) 2 % cream, Apply 1 application topically daily., Disp: 15 g, Rfl: 0 .  ketoconazole (NIZORAL) 2 % shampoo, Apply 1 application topically 2 (two) times a week., Disp: 120 mL, Rfl: 0 .  losartan (COZAAR) 25 MG tablet, TAKE 1 TABLET BY MOUTH  TWICE DAILY, Disp: 180 tablet, Rfl: 2 .  metFORMIN (GLUCOPHAGE) 500 MG tablet, TAKE 2 TABLETS BY MOUTH  TWICE DAILY WITH MEALS, Disp: 360 tablet, Rfl: 3 .  metoprolol succinate (TOPROL-XL) 25 MG 24 hr tablet, TAKE 1 TABLET BY MOUTH  DAILY, Disp: 90 tablet, Rfl: 3 .  Multiple Vitamin (MULTIVITAMIN) tablet, Take 1 tablet by mouth daily., Disp: , Rfl:  .  nystatin (MYCOSTATIN) 100000 UNIT/ML suspension, Take 5 mLs (500,000 Units total) by mouth 4 (four) times daily. Swish and swallow, Disp: 120 mL, Rfl: 0 .  nystatin cream (MYCOSTATIN), Apply 1 application topically 2 (two) times daily., Disp: 30 g, Rfl: 0 .  ondansetron (ZOFRAN ODT) 4 MG disintegrating tablet, Take 1 tablet (4 mg total) by mouth every 8 (eight) hours as needed for nausea or vomiting., Disp: 20 tablet, Rfl: 0 .  OXYGEN, Inhale 2 L into the lungs daily. 2 liters at night, 1 liter when  doing activities, and no oxygen when at home during the day., Disp: , Rfl:  .  potassium chloride (KLOR-CON) 10 MEQ tablet, TAKE 1 TABLET BY MOUTH  TWICE DAILY, Disp: 180 tablet, Rfl: 2 .  PROVENTIL HFA 108 (90 Base) MCG/ACT inhaler, Inhale 2 puffs into the lungs every 6 (six) hours as needed for wheezing or shortness of breath., Disp: 3 Inhaler, Rfl: 1 .  rosuvastatin (CRESTOR) 20 MG tablet, TAKE 1 TABLET BY MOUTH  DAILY, Disp: 90 tablet, Rfl: 3 .  vitamin C (ASCORBIC ACID) 500 MG tablet, Take 1,000 mg by mouth daily. , Disp: , Rfl:  .  Vitamin E 180 MG CAPS, Take 1 capsule by mouth daily., Disp: , Rfl:  .  predniSONE (DELTASONE) 20  MG tablet, Take 2 tablets (40 mg total) by mouth daily with breakfast., Disp: 10 tablet, Rfl: 0  EXAM: This is a telephone visit and thus no physical exam was completed.   ASSESSMENT AND PLAN:  Discussed the following assessment and plan:  COPD exacerbation ([place]) Symptoms are consistent with a COPD exacerbation.  We will treat with prednisone.  She will continue Bevespi.  Advised to seek medical attention for chest pain or worsening shortness of breath.  If not improving with the prednisone she will let us know.  I do not believe this represents COVID-19 as she has not had an exposure to anybody in the past 3 weeks.  Discussed if she developed fever or worsening symptoms I would recommend COVID-19 testing.   No orders of the defined types were placed in this encounter.   Meds ordered this encounter  Medications  . predniSONE (DELTASONE) 20 MG tablet    Sig: Take 2 tablets (40 mg total) by mouth daily with breakfast.    Dispense:  10 tablet    Refill:  0     I discussed the assessment and treatment plan with the patient. The patient was provided an opportunity to ask questions and all were answered. The patient agreed with the plan and demonstrated an understanding of the instructions.   The patient was advised to call back or seek an in-person evaluation  if the symptoms worsen or if the condition fails to improve as anticipated.  I provided 6 minutes of non-face-to-face time during this encounter.   [name], MD

## 2020-05-23 NOTE — Chronic Care Management (AMB) (Signed)
Chronic Care Management   Follow Up Note   05/23/2020 Name: Martha Woodard MRN: [id number] DOB: 1965-12-14  Referred by: Leone Haven, MD Reason for referral : Chronic Care Management (Medication Management)   Martha Woodard is a 54 y.o. year old female who is a primary care patient of Caryl Bis, Angela Adam, MD. The CCM team was consulted for assistance with chronic disease management and care coordination needs.    Contacted patient for medication management review.  Review of patient status, including review of consultants reports, relevant laboratory and other test results, and collaboration with appropriate care team members and the patient's provider was performed as part of comprehensive patient evaluation and provision of chronic care management services.    SDOH (Social Determinants of Health) assessments performed: Yes See Care Plan activities for detailed interventions related to SDOH)  SDOH Interventions     Most Recent Value  SDOH Interventions  Financial Strain Interventions Other (Comment)  [medication assistance]       Outpatient Encounter Medications as of 05/23/2020  Medication Sig Note  . Accu-Chek Softclix Lancets lancets TEST UP TO 4 TIMES A DAY   . albuterol (PROVENTIL) (2.5 MG/3ML) 0.083% nebulizer solution Take 3 mLs (2.5 mg total) by nebulization every 6 (six) hours as needed for wheezing or shortness of breath.   Marland Kitchen aspirin EC 81 MG tablet Take 1 tablet (81 mg total) by mouth daily.   . blood glucose meter kit and supplies Dispense based on patient and insurance preference. Use up to four times daily as directed. (FOR ICD-9 250.00, 250.01).   . calcium-vitamin D (OSCAL WITH D) 500-200 MG-UNIT tablet Take 1 tablet by mouth 2 (two) times daily.   . cetirizine (ZYRTEC) 10 MG tablet Take 10 mg by mouth daily.   . clotrimazole (MYCELEX) 10 MG troche Take 1 tablet (10 mg total) by mouth 5 (five) times daily.   . fluconazole (DIFLUCAN) 100 MG tablet Take 1  tablet (100 mg total) by mouth daily.   . fluticasone (FLONASE) 50 MCG/ACT nasal spray USE 1 OR 2 SPRAYS IN EACH NOSTRIL ONCE DAILY.   . furosemide (LASIX) 20 MG tablet TAKE 1 TABLET BY MOUTH  DAILY AS NEEDED 01/16/2020: 1 daily  . glucose blood (ACCU-CHEK AVIVA PLUS) test strip TEST UP TO 4 TIMES DAILY AS DIRECTED   . Glycopyrrolate-Formoterol (BEVESPI AEROSPHERE) 9-4.8 MCG/ACT AERO Inhale 2 puffs into the lungs 2 (two) times daily.   . hydrochlorothiazide (MICROZIDE) 12.5 MG capsule TAKE 1 CAPSULE BY MOUTH  DAILY   . ketoconazole (NIZORAL) 2 % cream Apply 1 application topically daily.   Marland Kitchen ketoconazole (NIZORAL) 2 % shampoo Apply 1 application topically 2 (two) times a week.   . losartan (COZAAR) 25 MG tablet TAKE 1 TABLET BY MOUTH  TWICE DAILY   . metFORMIN (GLUCOPHAGE) 500 MG tablet TAKE 2 TABLETS BY MOUTH  TWICE DAILY WITH MEALS   . metoprolol succinate (TOPROL-XL) 25 MG 24 hr tablet TAKE 1 TABLET BY MOUTH  DAILY   . Multiple Vitamin (MULTIVITAMIN) tablet Take 1 tablet by mouth daily.   Marland Kitchen nystatin (MYCOSTATIN) 100000 UNIT/ML suspension Take 5 mLs (500,000 Units total) by mouth 4 (four) times daily. Swish and swallow   . nystatin cream (MYCOSTATIN) Apply 1 application topically 2 (two) times daily. 01/16/2020: PRN under breast  . ondansetron (ZOFRAN ODT) 4 MG disintegrating tablet Take 1 tablet (4 mg total) by mouth every 8 (eight) hours as needed for nausea or vomiting.   . OXYGEN  Inhale 2 L into the lungs daily. 2 liters at night, 1 liter when doing activities, and no oxygen when at home during the day.   . potassium chloride (KLOR-CON) 10 MEQ tablet TAKE 1 TABLET BY MOUTH  TWICE DAILY   . PROVENTIL HFA 108 (90 Base) MCG/ACT inhaler Inhale 2 puffs into the lungs every 6 (six) hours as needed for wheezing or shortness of breath.   . rosuvastatin (CRESTOR) 20 MG tablet TAKE 1 TABLET BY MOUTH  DAILY   . vitamin C (ASCORBIC ACID) 500 MG tablet Take 1,000 mg by mouth daily.    . Vitamin E 180 MG  CAPS Take 1 capsule by mouth daily.    No facility-administered encounter medications on file as of 05/23/2020.     Objective:   Goals Addressed              This Visit's Progress     Patient Stated   .  "I want to stay healthy" (pt-stated)        CARE PLAN ENTRY (see longtitudinal plan of care for additional care plan information)  Current Barriers:  . Social, financial, and community barriers:  o Received Bevespi through patient assistance yesterday. Noticed that patient has an appt w/ NP at the office for "bronchitis" next week. Feels "irritation" in her chest since Discussed with patient. She reports more coughing and "pooling" of phlegm in her chest, tinted yellow. Fever of 99 this morning. Likely d/t being off therapy while awaiting Bevespi shipment (patient unable to fill LABA/LAMA tx at the pharmacy while awaiting shipment). No sick contacts. Has had both COVID vaccines.  . Polypharmacy; complex patient with multiple comorbidities including T2DM, CHF, COPD, HTN, HLD o COPD: Bevespi 9/4.8 mg two puffs twice daily, started yesterday - Hx thrush on Breztri - Patient APPROVED for [place] assistance for Breztri through 09/28/20 - Most recent PFT: severe obstructive lung disease w/ little post bronchodilator reversibility o HTN/CHF: HCTZ 12.5 mg daily, losartan 25 mg twice daily, metoprolol succinate 25 mg daily, furosemide 20 mg PRN o HLD: rosuvastatin 20 mg daily; LDL at goal <70 o T2DM: metformin 1000 mg BID; A1c at goal <7% o Topicals: nystatin suspension PRN thrush o Ketoconazole shampoo -uses PRN for scalp breakouts   Pharmacist Clinical Goal(s):  Marland Kitchen Over the next 90 days, patient will work with PharmD and provider towards optimized medication management  Interventions: . Comprehensive medication review performed, medication list updated in electronic medical record . Inter-disciplinary care team collaboration (see longitudinal plan of care) . Collaborated w/ office staff and  PCP to work patient in for visit this afternoon to address symptoms. Patient grateful.  . Reviewed refill procedure.  Patient Self Care Activities:  . Patient will take medications as prescribed  Please see past updates related to this goal by clicking on the "Past Updates" button in the selected goal          Plan:  - F/u call as scheduled in ~ 2 weeks  [name], PharmD, [place], [place] Pharmacist [place] [phone]

## 2020-05-23 NOTE — Progress Notes (Signed)
Patient presenting with coughing, wheezing, a rattling in her chest, and coughing up yellow mucous.  States that at the beginning of the week they changed her inhaler to [facility] and she stopped using her Proventil inhaler.   Patient thinks this caused her to have a bronchitis flare. With all of the coughing patient is having SOB, sore throat, and chest pain.

## 2020-05-23 NOTE — Assessment & Plan Note (Signed)
Symptoms are consistent with a COPD exacerbation.  We will treat with prednisone.  She will continue Bevespi.  Advised to seek medical attention for chest pain or worsening shortness of breath.  If not improving with the prednisone she will let us know.  I do not believe this represents COVID-19 as she has not had an exposure to anybody in the past 3 weeks.  Discussed if she developed fever or worsening symptoms I would recommend COVID-19 testing.

## 2020-05-23 NOTE — Patient Instructions (Signed)
Visit Information  Goals Addressed              This Visit's Progress     Patient Stated   .  "I want to stay healthy" (pt-stated)        CARE PLAN ENTRY (see longtitudinal plan of care for additional care plan information)  Current Barriers:  . Social, financial, and community barriers:  o Received Bevespi through patient assistance yesterday. Noticed that patient has an appt w/ NP at the office for "bronchitis" next week. Feels "irritation" in her chest since Discussed with patient. She reports more coughing and "pooling" of phlegm in her chest, tinted yellow. Fever of 99 this morning. Likely d/t being off therapy while awaiting Bevespi shipment (patient unable to fill LABA/LAMA tx at the pharmacy while awaiting shipment). No sick contacts. Has had both COVID vaccines.  . Polypharmacy; complex patient with multiple comorbidities including T2DM, CHF, COPD, HTN, HLD o COPD: Bevespi 9/4.8 mg two puffs twice daily, started yesterday - Hx thrush on Breztri - Patient APPROVED for [place] assistance for Breztri through 09/28/20 - Most recent PFT: severe obstructive lung disease w/ little post bronchodilator reversibility o HTN/CHF: HCTZ 12.5 mg daily, losartan 25 mg twice daily, metoprolol succinate 25 mg daily, furosemide 20 mg PRN o HLD: rosuvastatin 20 mg daily; LDL at goal <70 o T2DM: metformin 1000 mg BID; A1c at goal <7% o Topicals: nystatin suspension PRN thrush o Ketoconazole shampoo -uses PRN for scalp breakouts   Pharmacist Clinical Goal(s):  Marland Kitchen Over the next 90 days, patient will work with PharmD and provider towards optimized medication management  Interventions: . Comprehensive medication review performed, medication list updated in electronic medical record . Inter-disciplinary care team collaboration (see longitudinal plan of care) . Collaborated w/ office staff and PCP to work patient in for visit this afternoon to address symptoms. Patient grateful.  . Reviewed refill  procedure.  Patient Self Care Activities:  . Patient will take medications as prescribed  Please see past updates related to this goal by clicking on the "Past Updates" button in the selected goal         The patient verbalized understanding of instructions provided today and declined a print copy of patient instruction materials.   Plan:  - F/u call as scheduled in ~ 2 weeks  [name], PharmD, [place], [place] Pharmacist [place] [phone]

## 2020-05-28 ENCOUNTER — Telehealth: Payer: Medicare Other | Admitting: Nurse Practitioner

## 2020-05-31 DIAGNOSIS — J449 Chronic obstructive pulmonary disease, unspecified: Secondary | ICD-10-CM | POA: Diagnosis not present

## 2020-06-07 ENCOUNTER — Telehealth: Payer: Self-pay | Admitting: Pharmacy Technician

## 2020-06-07 ENCOUNTER — Ambulatory Visit: Payer: Medicare Other | Admitting: Pharmacist

## 2020-06-07 DIAGNOSIS — I5032 Chronic diastolic (congestive) heart failure: Secondary | ICD-10-CM

## 2020-06-07 DIAGNOSIS — J439 Emphysema, unspecified: Secondary | ICD-10-CM

## 2020-06-07 DIAGNOSIS — I10 Essential (primary) hypertension: Secondary | ICD-10-CM

## 2020-06-07 DIAGNOSIS — E1159 Type 2 diabetes mellitus with other circulatory complications: Secondary | ICD-10-CM

## 2020-06-07 NOTE — Chronic Care Management (AMB) (Signed)
Chronic Care Management   Follow Up Note   06/07/2020 Name: Martha Woodard MRN: [id number] DOB: 1965/12/29  Referred by: Leone Haven, MD Reason for referral : Chronic Care Management (Medication Management)   Martha Woodard is a 54 y.o. year old female who is a primary care patient of Caryl Bis, Angela Adam, MD. The CCM team was consulted for assistance with chronic disease management and care coordination needs.    Contacted patient for medication management review.   Review of patient status, including review of consultants reports, relevant laboratory and other test results, and collaboration with appropriate care team members and the patient's provider was performed as part of comprehensive patient evaluation and provision of chronic care management services.    SDOH (Social Determinants of Health) assessments performed: Yes See Care Plan activities for detailed interventions related to SDOH)  SDOH Interventions     Most Recent Value  SDOH Interventions  Financial Strain Interventions Other (Comment)  [manufacturer assistance programs]  Stress Interventions Intervention Not Indicated       Outpatient Encounter Medications as of 06/07/2020  Medication Sig Note   aspirin EC 81 MG tablet Take 1 tablet (81 mg total) by mouth daily.    blood glucose meter kit and supplies Dispense based on patient and insurance preference. Use up to four times daily as directed. (FOR ICD-9 250.00, 250.01).    calcium-vitamin D (OSCAL WITH D) 500-200 MG-UNIT tablet Take 1 tablet by mouth 2 (two) times daily.    cetirizine (ZYRTEC) 10 MG tablet Take 10 mg by mouth daily.    fluticasone (FLONASE) 50 MCG/ACT nasal spray USE 1 OR 2 SPRAYS IN EACH NOSTRIL ONCE DAILY.    furosemide (LASIX) 20 MG tablet TAKE 1 TABLET BY MOUTH  DAILY AS NEEDED 01/16/2020: 1 daily   glucose blood (ACCU-CHEK AVIVA PLUS) test strip TEST UP TO 4 TIMES DAILY AS DIRECTED    Glycopyrrolate-Formoterol (BEVESPI AEROSPHERE)  9-4.8 MCG/ACT AERO Inhale 2 puffs into the lungs 2 (two) times daily.    hydrochlorothiazide (MICROZIDE) 12.5 MG capsule TAKE 1 CAPSULE BY MOUTH  DAILY    losartan (COZAAR) 25 MG tablet TAKE 1 TABLET BY MOUTH  TWICE DAILY    metFORMIN (GLUCOPHAGE) 500 MG tablet TAKE 2 TABLETS BY MOUTH  TWICE DAILY WITH MEALS    metoprolol succinate (TOPROL-XL) 25 MG 24 hr tablet TAKE 1 TABLET BY MOUTH  DAILY    Multiple Vitamin (MULTIVITAMIN) tablet Take 1 tablet by mouth daily.    nystatin cream (MYCOSTATIN) Apply 1 application topically 2 (two) times daily. 01/16/2020: PRN under breast   ondansetron (ZOFRAN ODT) 4 MG disintegrating tablet Take 1 tablet (4 mg total) by mouth every 8 (eight) hours as needed for nausea or vomiting.    OXYGEN Inhale 2 L into the lungs daily. 2 liters at night, 1 liter when doing activities, and no oxygen when at home during the day.    potassium chloride (KLOR-CON) 10 MEQ tablet TAKE 1 TABLET BY MOUTH  TWICE DAILY    rosuvastatin (CRESTOR) 20 MG tablet TAKE 1 TABLET BY MOUTH  DAILY    vitamin C (ASCORBIC ACID) 500 MG tablet Take 1,000 mg by mouth daily.     Vitamin E 180 MG CAPS Take 1 capsule by mouth daily.    Accu-Chek Softclix Lancets lancets TEST UP TO 4 TIMES A DAY    albuterol (PROVENTIL) (2.5 MG/3ML) 0.083% nebulizer solution Take 3 mLs (2.5 mg total) by nebulization every 6 (six) hours as needed  for wheezing or shortness of breath. (Patient not taking: Reported on 06/07/2020)    ketoconazole (NIZORAL) 2 % cream Apply 1 application topically daily.    ketoconazole (NIZORAL) 2 % shampoo Apply 1 application topically 2 (two) times a week.    PROVENTIL HFA 108 (90 Base) MCG/ACT inhaler Inhale 2 puffs into the lungs every 6 (six) hours as needed for wheezing or shortness of breath. (Patient not taking: Reported on 06/07/2020)    [DISCONTINUED] clotrimazole (MYCELEX) 10 MG troche Take 1 tablet (10 mg total) by mouth 5 (five) times daily.    [DISCONTINUED] nystatin  (MYCOSTATIN) 100000 UNIT/ML suspension Take 5 mLs (500,000 Units total) by mouth 4 (four) times daily. Swish and swallow (Patient not taking: Reported on 06/07/2020)    [DISCONTINUED] predniSONE (DELTASONE) 20 MG tablet Take 2 tablets (40 mg total) by mouth daily with breakfast.    No facility-administered encounter medications on file as of 06/07/2020.     Objective:   Goals Addressed              This Visit's Progress     Patient Stated     "I want to stay healthy" (pt-stated)        CARE PLAN ENTRY (see longitudinal plan of care for additional care plan information)  Current Barriers:   Social, financial, community barriers:  o Denies medication cost concerns at this time, approved for assistance w/ inhaler therapy o Reports improvement in symptoms since tx for COPD exacerbation. Breathing improved. Still reports fatigue.   Polypharmacy; complex patient with multiple comorbidities including COPD, HTN, CHF, T2DM  Most recent eGFR: >100 mL/min  COPD: likely Stage D given symptom burden and exacerbation hx (2 requiring treatment in the past 6 months), though impacted by financial concerns and need to pursue inhalers through manufacturer assistance; follows w/ Dr. Patsey Berthold, though no f/u scheduled. Bevespi 2 puffs BID. Unfortunately, patient developed thrush w/ Breztri. Reports significant improvement in symptoms since being on Bevespi, no need for PRN albuterol HFA or nebulizer since starting Bevespi  Sinus congestion: cetirizine 10 mg daily, fluticasone intranasal PRN.   T2DM: last A1c 6.5% per home visit from insurance RN. Metformin 1000 mg BID o Reports fasting this morning of 156, but this is the highest she has seen recently. Not checking post prandial  HTN/CHF: follows w/ Dr. Fletcher Anon, no f/u scheduled. Losartan 25 mg BID, HCTZ 12.5 mg daily, metoprolol succinate 25 mg daily, furosemide 20 mg PRN - though requiring daily. Potassium 10 mEq BID o Not checking home BP readings.  Does not have a BP machine  HLD: rosuvastatin 20 mg daily, last LDL at goal <70  Antiplatelet tx: ASA 81 mg daily; patient reports that she has been reading things about "aspirin causing stomach problems" and wonders if she should be taking it  Supplements: Vitamin C, Vitamin E  Pharmacist Clinical Goal(s):   Over the next 90 days, patient will work with PharmD and provider towards optimized medication management  Interventions:  Comprehensive medication review performed; medication list updated in electronic medical record  Inter-disciplinary care team collaboration (see longitudinal plan of care)  Patient overdue for f/u with PCP for chronic medical conditions and labwork. Will collaborate w/ office staff to call her to schedule  Reviewed importance of adherence to maintenance inhaler to manage COPD. Patient verbalized understanding.   Reviewed importance of home BP monitoring. Discussed ability to order BP cuff from insurance OTC benefits. Patient notes she will do this with her order next month. Advised  arm cuff over wrist cuff.   Praised for maintenance of goal lipids. Encouraged continued adherence.   Discussed risk of GI bleeds w/ NSAIDS, but that risk w/ low dose aspirin is minimal, especially enteric coated. Continue current regimen.  Patient Self Care Activities:   Patient will take medications as prescribed  Patient will check BG periodically, document, and provide at future appointments  Patient will order BP cuff, check BP at home periodically, document, and provide at future appointments  Please see past updates related to this goal by clicking on the "Past Updates" button in the selected goal          Plan:  - Patient to f/u with PCP in ~4-6 weeks, scheduled f/u call with me in ~ 8 weeks  [name], PharmD, [place], [place] Pharmacist [place] [phone]

## 2020-06-07 NOTE — Telephone Encounter (Signed)
Patient is showing prescription drug coverage.  Have attempted to call patient several times to discuss eligibility for [facility] program.  Unable to reach patient.  [facility]

## 2020-06-07 NOTE — Patient Instructions (Addendum)
[name],   It was great talking to you today!  Keep up the great work with taking your medications as prescribed. I'm so glad the Charolotte Woodard has helped!  If you haven't heard from our office yet, please call and schedule an appointment with Dr. Caryl Bis regarding your chronic medical conditions.   I recommend ordering a blood pressure machine (arm cuff, NOT wrist cuff) with your Over The Counter benefits through your insurance. It is incredibly helpful to be able to see how your blood pressures are running at home.   Keep periodically checking blood sugars. To maintain an A1c of <7%, we are looking for fasting readings to be <130 and 2 hour after meal readings to be <180.   As always, call me with any questions or concerns!  [name], PharmD [phone]   Visit Information  Goals Addressed              This Visit's Progress     Patient Stated   .  "I want to stay healthy" (pt-stated)        CARE PLAN ENTRY (see longitudinal plan of care for additional care plan information)  Current Barriers:  . Social, financial, community barriers:  o Denies medication cost concerns at this time, approved for assistance w/ inhaler therapy o Reports improvement in symptoms since tx for COPD exacerbation. Breathing improved. Still reports fatigue.  . Polypharmacy; complex patient with multiple comorbidities including COPD, HTN, CHF, T2DM . Most recent eGFR: >100 mL/min . COPD: likely Stage D given symptom burden and exacerbation hx (2 requiring treatment in the past 6 months), though impacted by financial concerns and need to pursue inhalers through manufacturer assistance; follows w/ Dr. Patsey Berthold, though no f/u scheduled. Bevespi 2 puffs BID. Unfortunately, patient developed thrush w/ Breztri. Reports significant improvement in symptoms since being on Bevespi, no need for PRN albuterol HFA or nebulizer since starting Bevespi . Sinus congestion: cetirizine 10 mg daily, fluticasone  intranasal PRN.  . T2DM: last A1c 6.5% per home visit from insurance RN. Metformin 1000 mg BID o Reports fasting this morning of 156, but this is the highest she has seen recently. Not checking post prandial . HTN/CHF: follows w/ Dr. Fletcher Anon, no f/u scheduled. Losartan 25 mg BID, HCTZ 12.5 mg daily, metoprolol succinate 25 mg daily, furosemide 20 mg PRN - though requiring daily. Potassium 10 mEq BID o Not checking home BP readings. Does not have a BP machine . HLD: rosuvastatin 20 mg daily, last LDL at goal <70 . Antiplatelet tx: ASA 81 mg daily; patient reports that she has been reading things about "aspirin causing stomach problems" and wonders if she should be taking it . Supplements: Vitamin C, Vitamin E  Pharmacist Clinical Goal(s):  Marland Kitchen Over the next 90 days, patient will work with PharmD and provider towards optimized medication management  Interventions: . Comprehensive medication review performed; medication list updated in electronic medical record . Inter-disciplinary care team collaboration (see longitudinal plan of care) . Patient overdue for f/u with PCP for chronic medical conditions and labwork. Will collaborate w/ office staff to call her to schedule . Reviewed importance of adherence to maintenance inhaler to manage COPD. Patient verbalized understanding.  . Reviewed importance of home BP monitoring. Discussed ability to order BP cuff from insurance OTC benefits. Patient notes she will do this with her order next month. Advised arm cuff over wrist cuff.  . Praised for maintenance of goal lipids. Encouraged continued adherence.  . Discussed risk of GI  bleeds w/ NSAIDS, but that risk w/ low dose aspirin is minimal, especially enteric coated. Continue current regimen.  Patient Self Care Activities:  . Patient will take medications as prescribed . Patient will check BG periodically, document, and provide at future appointments . Patient will order BP cuff, check BP at home  periodically, document, and provide at future appointments  Please see past updates related to this goal by clicking on the "Past Updates" button in the selected goal         The patient verbalized understanding of instructions provided today and agreed to receive a mailed copy of patient instruction and/or educational materials.  Plan:  - Patient to f/u with PCP in ~4-6 weeks, scheduled f/u call with me in ~ 8 weeks  [name], PharmD, [place], CPP Clinical Pharmacist [place] [phone]   COPD Action Plan A COPD action plan is a description of what to do when you have a flare (exacerbation) of chronic obstructive pulmonary disease (COPD). Your action plan is a color-coded plan that lists the symptoms that indicate whether or not your condition is under control and what actions to take.  If you have symptoms in the green zone, it means you are doing well that day.  If you have symptoms in the yellow zone, it means you are having a bad day or an exacerbation.  If you have symptoms in the red zone, you need urgent medical care. Follow the plan you and your health care provider developed. Review your plan with your health care provider at each visit. Red zone Symptoms in this zone mean that you should get medical help right away. They include:  Feeling very short of breath, even when you are resting.  Not being able to do any activities because of poor breathing.  Not being able to sleep because of poor breathing.  Fever or shaking chills.  Feeling confused or very sleepy.  Chest pain.  Coughing up blood. If you have any of these symptoms, call emergency services (911 in the [place] or go to the nearest emergency room. Yellow zone Symptoms in this zone mean that your condition may be getting worse. They include:  Feeling more short of breath than usual.  Having less energy for daily activities than usual.  Phlegm  or mucus that is thicker than usual.  Needing to use your rescue inhaler or nebulizer more often than usual.  More ankle swelling than usual.  Coughing more than usual.  Feeling like you have a chest cold.  Trouble sleeping due to COPD symptoms.  Decreased appetite.  COPD medicines not helping as much as usual. If you experience any "yellow" symptoms:  Keep taking your daily medicines as directed.  Use your quick-relief inhaler as told by your health care provider.  If you were prescribed steroid medicine to take by mouth (oral medicine), start taking it as told by your health care provider.  If you were prescribed an antibiotic, start taking it as told by your health care provider. Do not stop taking the antibiotic even if you start to feel better.  Use oxygen as told by your health care provider.  Get more rest.  Do your pursed-lip breathing exercises.  Do not smoke. Avoid any irritants in the air. If your signs and symptoms do not improve after taking these steps, call your health care provider right away. Green zone Symptoms in this zone mean that you are doing well. They include:  Being able  to do your usual activities and exercise.  Having the usual amount of coughing, including the same amount of phlegm or mucus.  Being able to sleep well.  Having a good appetite. Follow these instructions at home:  Continue taking your daily medicines as told by your health care provider.  Make sure you receive all the immunizations that your health care provider recommends, especially the pneumococcal and influenza vaccines.  Wash your hands often with soap and water. Have family members wash their hands too. Regular hand washing can help prevent infections.  Follow your usual exercise and diet plan.  Avoid irritants in the air, such as smoke.  Do not use any products that contain nicotine or tobacco, such as cigarettes and e-cigarettes. If you need help quitting, ask  your health care provider. Where to find more information: You can find more information about COPD from:  American Lung Association, My COPD Action Plan: SlotDealers.si.pdf  COPD Foundation: www.copdfoundation.[place]: http://cline.com/ This information is not intended to replace advice given to you by your health care provider. Make sure you discuss any questions you have with your health care provider. Document Revised: 01/20/2018 Document Reviewed: 01/28/2017 Elsevier Patient Education  [place].  Chronic Obstructive Pulmonary Disease Chronic obstructive pulmonary disease (COPD) is a long-term (chronic) lung problem. When you have COPD, it is hard for air to get in and out of your lungs. Usually the condition gets worse over time, and your lungs will never return to normal. There are things you can do to keep yourself as healthy as possible.  Your doctor may treat your condition with: ? Medicines. ? Oxygen. ? Lung surgery.  Your doctor may also recommend: ? Rehabilitation. This includes steps to make your body work better. It may involve a team of specialists. ? Quitting smoking, if you smoke. ? Exercise and changes to your diet. ? Comfort measures (palliative care). Follow these instructions at home: Medicines  Take over-the-counter and prescription medicines only as told by your doctor.  Talk to your doctor before taking any cough or allergy medicines. You may need to avoid medicines that cause your lungs to be dry. Lifestyle  If you smoke, stop. Smoking makes the problem worse. If you need help quitting, ask your doctor.  Avoid being around things that make your breathing worse. This may include smoke, chemicals, and fumes.  Stay active, but remember to rest as well.  Learn and use tips on how to relax.  Make sure you get enough sleep. Most adults need at  least 7 hours of sleep every night.  Eat healthy foods. Eat smaller meals more often. Rest before meals. Controlled breathing Learn and use tips on how to control your breathing as told by your doctor. Try:  Breathing in (inhaling) through your nose for 1 second. Then, pucker your lips and breath out (exhale) through your lips for 2 seconds.  Putting one hand on your belly (abdomen). Breathe in slowly through your nose for 1 second. Your hand on your belly should move out. Pucker your lips and breathe out slowly through your lips. Your hand on your belly should move in as you breathe out.  Controlled coughing Learn and use controlled coughing to clear mucus from your lungs. Follow these steps: 1. Lean your head a little forward. 2. Breathe in deeply. 3. Try to hold your breath for 3 seconds. 4. Keep your mouth slightly open while coughing 2 times. 5. Spit any mucus  out into a tissue. 6. Rest and do the steps again 1 or 2 times as needed. General instructions  Make sure you get all the shots (vaccines) that your doctor recommends. Ask your doctor about a flu shot and a pneumonia shot.  Use oxygen therapy and pulmonary rehabilitation if told by your doctor. If you need home oxygen therapy, ask your doctor if you should buy a tool to measure your oxygen level (oximeter).  Make a COPD action plan with your doctor. This helps you to know what to do if you feel worse than usual.  Manage any other conditions you have as told by your doctor.  Avoid going outside when it is very hot, cold, or humid.  Avoid people who have a sickness you can catch (contagious).  Keep all follow-up visits as told by your doctor. This is important. Contact a doctor if:  You cough up more mucus than usual.  There is a change in the color or thickness of the mucus.  It is harder to breathe than usual.  Your breathing is faster than usual.  You have trouble sleeping.  You need to use your medicines more  often than usual.  You have trouble doing your normal activities such as getting dressed or walking around the house. Get help right away if:  You have shortness of breath while resting.  You have shortness of breath that stops you from: ? Being able to talk. ? Doing normal activities.  Your chest hurts for longer than 5 minutes.  Your skin color is more blue than usual.  Your pulse oximeter shows that you have low oxygen for longer than 5 minutes.  You have a fever.  You feel too tired to breathe normally. Summary  Chronic obstructive pulmonary disease (COPD) is a long-term lung problem.  The way your lungs work will never return to normal. Usually the condition gets worse over time. There are things you can do to keep yourself as healthy as possible.  Take over-the-counter and prescription medicines only as told by your doctor.  If you smoke, stop. Smoking makes the problem worse. This information is not intended to replace advice given to you by your health care provider. Make sure you discuss any questions you have with your health care provider. Document Revised: 08/28/2017 Document Reviewed: 10/20/2016 Elsevier Patient Education  2020 [place].   Chronic Obstructive Pulmonary Disease Chronic obstructive pulmonary disease (COPD) is a long-term (chronic) lung problem. When you have COPD, it is hard for air to get in and out of your lungs. Usually the condition gets worse over time, and your lungs will never return to normal. There are things you can do to keep yourself as healthy as possible.  Your doctor may treat your condition with: ? Medicines. ? Oxygen. ? Lung surgery.  Your doctor may also recommend: ? Rehabilitation. This includes steps to make your body work better. It may involve a team of specialists. ? Quitting smoking, if you smoke. ? Exercise and changes to your diet. ? Comfort measures (palliative care). Follow these instructions at  home: Medicines  Take over-the-counter and prescription medicines only as told by your doctor.  Talk to your doctor before taking any cough or allergy medicines. You may need to avoid medicines that cause your lungs to be dry. Lifestyle  If you smoke, stop. Smoking makes the problem worse. If you need help quitting, ask your doctor.  Avoid being around things that make your breathing worse. This may  include smoke, chemicals, and fumes.  Stay active, but remember to rest as well.  Learn and use tips on how to relax.  Make sure you get enough sleep. Most adults need at least 7 hours of sleep every night.  Eat healthy foods. Eat smaller meals more often. Rest before meals. Controlled breathing Learn and use tips on how to control your breathing as told by your doctor. Try:  Breathing in (inhaling) through your nose for 1 second. Then, pucker your lips and breath out (exhale) through your lips for 2 seconds.  Putting one hand on your belly (abdomen). Breathe in slowly through your nose for 1 second. Your hand on your belly should move out. Pucker your lips and breathe out slowly through your lips. Your hand on your belly should move in as you breathe out.  Controlled coughing Learn and use controlled coughing to clear mucus from your lungs. Follow these steps: 7. Lean your head a little forward. 8. Breathe in deeply. 9. Try to hold your breath for 3 seconds. 10. Keep your mouth slightly open while coughing 2 times. 11. Spit any mucus out into a tissue. 12. Rest and do the steps again 1 or 2 times as needed. General instructions  Make sure you get all the shots (vaccines) that your doctor recommends. Ask your doctor about a flu shot and a pneumonia shot.  Use oxygen therapy and pulmonary rehabilitation if told by your doctor. If you need home oxygen therapy, ask your doctor if you should buy a tool to measure your oxygen level (oximeter).  Make a COPD action plan with your doctor.  This helps you to know what to do if you feel worse than usual.  Manage any other conditions you have as told by your doctor.  Avoid going outside when it is very hot, cold, or humid.  Avoid people who have a sickness you can catch (contagious).  Keep all follow-up visits as told by your doctor. This is important. Contact a doctor if:  You cough up more mucus than usual.  There is a change in the color or thickness of the mucus.  It is harder to breathe than usual.  Your breathing is faster than usual.  You have trouble sleeping.  You need to use your medicines more often than usual.  You have trouble doing your normal activities such as getting dressed or walking around the house. Get help right away if:  You have shortness of breath while resting.  You have shortness of breath that stops you from: ? Being able to talk. ? Doing normal activities.  Your chest hurts for longer than 5 minutes.  Your skin color is more blue than usual.  Your pulse oximeter shows that you have low oxygen for longer than 5 minutes.  You have a fever.  You feel too tired to breathe normally. Summary  Chronic obstructive pulmonary disease (COPD) is a long-term lung problem.  The way your lungs work will never return to normal. Usually the condition gets worse over time. There are things you can do to keep yourself as healthy as possible.  Take over-the-counter and prescription medicines only as told by your doctor.  If you smoke, stop. Smoking makes the problem worse. This information is not intended to replace advice given to you by your health care provider. Make sure you discuss any questions you have with your health care provider. Document Revised: 08/28/2017 Document Reviewed: 10/20/2016 Elsevier Patient Education  2020 [employer].

## 2020-06-10 ENCOUNTER — Other Ambulatory Visit: Payer: Self-pay | Admitting: Family Medicine

## 2020-06-20 ENCOUNTER — Other Ambulatory Visit: Payer: Self-pay | Admitting: Family Medicine

## 2020-06-30 DIAGNOSIS — J449 Chronic obstructive pulmonary disease, unspecified: Secondary | ICD-10-CM | POA: Diagnosis not present

## 2020-07-31 ENCOUNTER — Ambulatory Visit: Payer: Medicare Other | Admitting: Pulmonary Disease

## 2020-07-31 ENCOUNTER — Other Ambulatory Visit: Payer: Self-pay

## 2020-07-31 VITALS — BP 140/80 | HR 90 | Temp 97.1°F | Ht 66.0 in | Wt 257.4 lb

## 2020-07-31 DIAGNOSIS — E662 Morbid (severe) obesity with alveolar hypoventilation: Secondary | ICD-10-CM

## 2020-07-31 DIAGNOSIS — Z23 Encounter for immunization: Secondary | ICD-10-CM | POA: Diagnosis not present

## 2020-07-31 DIAGNOSIS — J449 Chronic obstructive pulmonary disease, unspecified: Secondary | ICD-10-CM | POA: Diagnosis not present

## 2020-07-31 DIAGNOSIS — J9611 Chronic respiratory failure with hypoxia: Secondary | ICD-10-CM | POA: Diagnosis not present

## 2020-07-31 DIAGNOSIS — J9612 Chronic respiratory failure with hypercapnia: Secondary | ICD-10-CM | POA: Diagnosis not present

## 2020-07-31 NOTE — Patient Instructions (Addendum)
Continue oxygen like you are doing.  We will send a request for adapt to do an evaluation for a more transportable source of oxygen.  Continue Bevespi twice a day.   We will see him in follow-up in 4 months time call sooner should any new problems arise.

## 2020-07-31 NOTE — Progress Notes (Signed)
Subjective:    Patient ID: Martha Woodard, female    DOB: 10-24-1965, 54 y.o.   MRN: [id number]  PROBLEMS: Chronic hypoxic/hypercarbic respiratory failure Former smoker Moderate/severe COPD [facility] CHF  Multiple pulmonary nodules - stable > 2 yrs and deemed benign  DATA: 09/28/2015 2D echo: EF 60 to 65%, grade 1 DD, mild left atrial dilation.  Right-sided pressures normal. 02/28/2020 PFTs:FEV1 1.42L or 49%, FVC or 2.24L 60%, FEV1/FVC 63%, no bronchodilator response.  RV is 122% indicating air trapping.  Diffusion capacity normal.  Consistent with moderate to severe COPD. 04/05/2020 chest x-ray: No active disease.  Consistent with COPD, left base scarring/atelectasis.  INTERVAL HISTORY: Last seen by  04/05/2020. No major pulmonary events since then  HPI Martha Woodard is a 54 year old former smoker (2014) who presents for follow-up on COPD.  This is a scheduled visit.  Recall that her last visit was on 8 July, at that time she was having some minor hemoptysis.  She had significant flare of sinusitis with epistaxis as well.  Chest x-ray at that time did not show pathology to support hemoptysis.  Hemoptysis resolved with management of her sinusitis.  She was switched from [place] to [place] 2 puffs twice a day and thrush with this.  This was switched to Bevespi 2 puffs twice a day and she has been doing really well with this medication.  Notes her dyspnea markedly improved.  No further issues with hemoptysis.  She continues to be abstinent of cigarettes.  She is compliant with oxygen.  She is on 1 L/min of O2 flow.  She does not endorse any fever, chills or sweats.  No chest pain.  No orthopnea or paroxysmal nocturnal dyspnea.  Otherwise doing well.  Review of Systems Allergies  Allergen Reactions  . Augmentin [Amoxicillin-Pot Clavulanate] Itching   Current Meds  Medication Sig  . Accu-Chek Softclix Lancets lancets TEST UP TO 4 TIMES DAILY  . albuterol (PROVENTIL) (2.5 MG/3ML) 0.083%  nebulizer solution Take 3 mLs (2.5 mg total) by nebulization every 6 (six) hours as needed for wheezing or shortness of breath.  Marland Kitchen aspirin EC 81 MG tablet Take 1 tablet (81 mg total) by mouth daily.  . blood glucose meter kit and supplies Dispense based on patient and insurance preference. Use up to four times daily as directed. (FOR ICD-9 250.00, 250.01).  . calcium-vitamin D (OSCAL WITH D) 500-200 MG-UNIT tablet Take 1 tablet by mouth 2 (two) times daily.  . cetirizine (ZYRTEC) 10 MG tablet Take 10 mg by mouth daily.  . fluticasone (FLONASE) 50 MCG/ACT nasal spray USE 1 OR 2 SPRAYS IN EACH NOSTRIL ONCE DAILY.  . furosemide (LASIX) 20 MG tablet TAKE 1 TABLET BY MOUTH  DAILY AS NEEDED  . glucose blood (ACCU-CHEK AVIVA PLUS) test strip TEST UP TO 4 TIMES DAILY AS DIRECTED  . Glycopyrrolate-Formoterol (BEVESPI AEROSPHERE) 9-4.8 MCG/ACT AERO Inhale 2 puffs into the lungs 2 (two) times daily.  . hydrochlorothiazide (MICROZIDE) 12.5 MG capsule TAKE 1 CAPSULE BY MOUTH  DAILY  . ketoconazole (NIZORAL) 2 % cream Apply 1 application topically daily.  Marland Kitchen ketoconazole (NIZORAL) 2 % shampoo Apply 1 application topically 2 (two) times a week.  . losartan (COZAAR) 25 MG tablet TAKE 1 TABLET BY MOUTH  TWICE DAILY  . metFORMIN (GLUCOPHAGE) 500 MG tablet TAKE 2 TABLETS BY MOUTH  TWICE DAILY WITH MEALS  . metoprolol succinate (TOPROL-XL) 25 MG 24 hr tablet TAKE 1 TABLET BY MOUTH  DAILY  . Multiple Vitamin (MULTIVITAMIN)  tablet Take 1 tablet by mouth daily.  Marland Kitchen nystatin cream (MYCOSTATIN) Apply 1 application topically 2 (two) times daily.  . ondansetron (ZOFRAN ODT) 4 MG disintegrating tablet Take 1 tablet (4 mg total) by mouth every 8 (eight) hours as needed for nausea or vomiting.  . OXYGEN Inhale 2 L into the lungs daily. 2 liters at night, 1 liter when doing activities, and no oxygen when at home during the day.  . potassium chloride (KLOR-CON) 10 MEQ tablet TAKE 1 TABLET BY MOUTH  TWICE DAILY  . PROVENTIL HFA  108 (90 Base) MCG/ACT inhaler Inhale 2 puffs into the lungs every 6 (six) hours as needed for wheezing or shortness of breath.  . rosuvastatin (CRESTOR) 20 MG tablet TAKE 1 TABLET BY MOUTH  DAILY  . vitamin C (ASCORBIC ACID) 500 MG tablet Take 1,000 mg by mouth daily.   . Vitamin E 180 MG CAPS Take 1 capsule by mouth daily.   Immunization History  Administered Date(s) Administered  . Influenza,inj,Quad PF,6+ Mos 07/02/2015, 08/19/2016, 08/07/2017, 06/16/2018, 06/29/2019  . Moderna SARS-COVID-2 Vaccination 03/08/2020, 04/05/2020  . Pneumococcal Polysaccharide-23 09/26/2015      Objective:   Physical Exam BP 140/80 (BP Location: Left Arm, Cuff Size: Normal)   Pulse 90   Temp (!) 97.1 F (36.2 C) (Temporal)   Ht 5' 6"  (1.676 m)   Wt 257 lb 6.4 oz (116.8 kg)   LMP 04/10/2014   SpO2 92%   BMI 41.55 kg/m  GENERAL: Obese woman, no acute distress, comfortable with nasal cannula O2.  Fully ambulatory. HEAD: Normocephalic, atraumatic.  EYES: Pupils equal, round, reactive to light.  No scleral icterus.  MOUTH: Nose/mouth/throat not examined due to masking requirements for COVID 19. NECK: Supple. No thyromegaly. Trachea midline. No JVD.  No adenopathy. PULMONARY: Mildly diminished breath sounds.  No adventitious sounds. CARDIOVASCULAR: S1 and S2. Regular rate and rhythm.  No rubs, murmurs or gallops heard. ABDOMEN: Obese, otherwise benign. MUSCULOSKELETAL: No joint deformity, no clubbing, no edema.  NEUROLOGIC: No focal deficit, no gait disturbance, speech is fluent. SKIN: Intact,warm,dry. PSYCH: Mood and behavior normal       Assessment & Plan:     ICD-10-CM   1. COPD with chronic bronchitis and emphysema ([place])  J44.9    Continue Bevespi 2 puffs twice a day Continue as needed albuterol  2. Chronic respiratory failure with hypoxia and hypercapnia ([facility])  J96.11 AMB REFERRAL FOR DME   J96.12    Continue oxygen at 1 L/min Patient compliant with oxygen  3. Obesity hypoventilation  syndrome ([facility])  E66.2    This issue adds complexity to her management Likely cause of her hypoxia  4. Need for immunization against influenza  Z23 Flu Vaccine QUAD 36+ mos IM   Received influenza vaccine today   Orders Placed This Encounter  Procedures  . Flu Vaccine QUAD 36+ mos IM  . AMB REFERRAL FOR DME    Referral Priority:   Routine    Referral Type:   Durable Medical Equipment Purchase    Number of Visits Requested:   1    Sent in follow-up in 4 months time she is to contact us prior to that time should any new difficulties arise.

## 2020-08-02 ENCOUNTER — Ambulatory Visit: Payer: Medicare Other | Admitting: Pharmacist

## 2020-08-02 DIAGNOSIS — J439 Emphysema, unspecified: Secondary | ICD-10-CM

## 2020-08-02 DIAGNOSIS — E1159 Type 2 diabetes mellitus with other circulatory complications: Secondary | ICD-10-CM

## 2020-08-02 DIAGNOSIS — E782 Mixed hyperlipidemia: Secondary | ICD-10-CM

## 2020-08-02 DIAGNOSIS — I10 Essential (primary) hypertension: Secondary | ICD-10-CM

## 2020-08-02 NOTE — Patient Instructions (Addendum)
Ms. Stowell,   It was great talking with you today!  We reapplied for Bevespi assistance with [employer]. I've included the confirmation page. Allow up to 10 days for processing, and you should receive an approval letter in the mail. Sharee Pimple will also plan to follow up with [place].   Someone should have called you by now to set up an appointment with Dr. Caryl Bis. If you haven't gotten that scheduled yet, please call the office to schedule.   Keep up the great work!  [name], PharmD [phone]  Visit Information  Goals Addressed              This Visit's Progress     Patient Stated   .  "I want to stay healthy" (pt-stated)        CARE PLAN ENTRY (see longitudinal plan of care for additional care plan information)  Current Barriers:  . Social, financial, community barriers:  o None noted at this time. Due to reapply for [facility] assistance through [employer] for 2022  o Using herbal tea, reports that this seems to help her breathing as well.  . Polypharmacy; complex patient with multiple comorbidities including COPD, HTN, CHF, T2DM . Most recent eGFR: >100 mL/min . COPD: Bevespi 9/4.8 mcg 2 puffs BID . Sinus congestion: cetirizine 10 mg daily, fluticasone intranasal PRN . T2DM: last A1c 6.5% per home visit from insurance RN. metformin 1000 mg BID o Glucose readings: fastings 120-130s o Foot exam: DUE o Eye exam: up to date o Nephropathy screening: due  . HTN/CHF: follows w/ Dr. Fletcher Anon, no f/u scheduled. Losartan 25 mg BID, HCTZ 12.5 mg daily, metoprolol succinate 25 mg daily, furosemide 20 mg PRN - though requiring daily. Potassium 10 mEq BID o Not checking home BP readings. Does not have a BP machine. Investigated ordering through OTC benefits, but she used up her benefits this quarter on vitamins. BP elevated earlier this week at pulmonology visit, but she notes that she didn't sit for 5 minutes before the reading . HLD: rosuvastatin 20 mg daily, last LDL at  goal <70 . Antiplatelet tx: ASA 81 mg daily . Supplements: Vitamin C, Vitamin E . Vaccinations: received influenza vaccine this week. Up to date on COVID vaccines, will be due for booster in January. Up to date on pneumonia at this time. No hx shingles vaccine.   Pharmacist Clinical Goal(s):  Marland Kitchen Over the next 90 days, patient will work with PharmD and provider towards optimized medication management  Interventions: . Comprehensive medication review performed; medication list updated in electronic medical record . Inter-disciplinary care team collaboration (see longitudinal plan of care) . Praised for continued control of pulmonary disease. Completed [place] re-enrollment request form for [employer] w/ patient. Will collaborate w/ CPhT on next steps.  . Reviewed benefits of home BP monitoring. She will continue to evaluate if she can afford a home BP monitor w/ OTC benefits . Overdue for follow up and lab work with PCP. Will collaborate w/ office staff to outreach patient to call and schedule.  . Lipids at goal. Encouraged adherence to rosuvastatin  Patient Self Care Activities:  . Patient will take medications as prescribed . Patient will check BG periodically, document, and provide at future appointments . Patient will order BP cuff, check BP at home periodically, document, and provide at future appointments  Please see past updates related to this goal by clicking on the "Past Updates" button in the selected goal  The patient verbalized understanding of instructions provided today and agreed to receive a mailed copy of patient instruction and/or educational materials.   Plan:  - Scheduled f/u call in ~ 12 weeks  [name], PharmD, [place], [place] Pharmacist [place] [phone]

## 2020-08-02 NOTE — Chronic Care Management (AMB) (Signed)
Chronic Care Management   Follow Up Note   08/02/2020 Name: Martha Woodard MRN: [id number] DOB: 25-Apr-1966  Referred by: Leone Haven, MD Reason for referral : Chronic Care Management (Medication Management)   Martha Woodard is a 54 y.o. year old female who is a primary care patient of Caryl Bis, Angela Adam, MD. The CCM team was consulted for assistance with chronic disease management and care coordination needs.    Contacted patient for medication management review.   Review of patient status, including review of consultants reports, relevant laboratory and other test results, and collaboration with appropriate care team members and the patient's provider was performed as part of comprehensive patient evaluation and provision of chronic care management services.    SDOH (Social Determinants of Health) assessments performed: Yes See Care Plan activities for detailed interventions related to SDOH)  SDOH Interventions     Most Recent Value  SDOH Interventions  Financial Strain Interventions Other (Comment)  [manufacturer assistance]       Outpatient Encounter Medications as of 08/02/2020  Medication Sig Note  . Accu-Chek Softclix Lancets lancets TEST UP TO 4 TIMES DAILY   . aspirin EC 81 MG tablet Take 1 tablet (81 mg total) by mouth daily.   . blood glucose meter kit and supplies Dispense based on patient and insurance preference. Use up to four times daily as directed. (FOR ICD-9 250.00, 250.01).   . calcium-vitamin D (OSCAL WITH D) 500-200 MG-UNIT tablet Take 1 tablet by mouth 2 (two) times daily.   . cetirizine (ZYRTEC) 10 MG tablet Take 10 mg by mouth daily.   . fluticasone (FLONASE) 50 MCG/ACT nasal spray USE 1 OR 2 SPRAYS IN EACH NOSTRIL ONCE DAILY.   . furosemide (LASIX) 20 MG tablet TAKE 1 TABLET BY MOUTH  DAILY AS NEEDED 01/16/2020: 1 daily  . glucose blood (ACCU-CHEK AVIVA PLUS) test strip TEST UP TO 4 TIMES DAILY AS DIRECTED   . Glycopyrrolate-Formoterol (BEVESPI  AEROSPHERE) 9-4.8 MCG/ACT AERO Inhale 2 puffs into the lungs 2 (two) times daily.   . hydrochlorothiazide (MICROZIDE) 12.5 MG capsule TAKE 1 CAPSULE BY MOUTH  DAILY   . losartan (COZAAR) 25 MG tablet TAKE 1 TABLET BY MOUTH  TWICE DAILY   . metFORMIN (GLUCOPHAGE) 500 MG tablet TAKE 2 TABLETS BY MOUTH  TWICE DAILY WITH MEALS   . metoprolol succinate (TOPROL-XL) 25 MG 24 hr tablet TAKE 1 TABLET BY MOUTH  DAILY   . Multiple Vitamin (MULTIVITAMIN) tablet Take 1 tablet by mouth daily.   . OXYGEN Inhale 2 L into the lungs daily. 2 liters at night, 1 liter when doing activities, and no oxygen when at home during the day.   . potassium chloride (KLOR-CON) 10 MEQ tablet TAKE 1 TABLET BY MOUTH  TWICE DAILY   . rosuvastatin (CRESTOR) 20 MG tablet TAKE 1 TABLET BY MOUTH  DAILY   . vitamin C (ASCORBIC ACID) 500 MG tablet Take 1,000 mg by mouth daily.    . Vitamin E 180 MG CAPS Take 1 capsule by mouth daily.   Marland Kitchen albuterol (PROVENTIL) (2.5 MG/3ML) 0.083% nebulizer solution Take 3 mLs (2.5 mg total) by nebulization every 6 (six) hours as needed for wheezing or shortness of breath. (Patient not taking: Reported on 08/02/2020)   . ketoconazole (NIZORAL) 2 % cream Apply 1 application topically daily. (Patient not taking: Reported on 08/02/2020)   . ketoconazole (NIZORAL) 2 % shampoo Apply 1 application topically 2 (two) times a week. (Patient not taking: Reported on  08/02/2020)   . nystatin cream (MYCOSTATIN) Apply 1 application topically 2 (two) times daily. (Patient not taking: Reported on 08/02/2020) 01/16/2020: PRN under breast  . ondansetron (ZOFRAN ODT) 4 MG disintegrating tablet Take 1 tablet (4 mg total) by mouth every 8 (eight) hours as needed for nausea or vomiting. (Patient not taking: Reported on 08/02/2020)   . PROVENTIL HFA 108 (90 Base) MCG/ACT inhaler Inhale 2 puffs into the lungs every 6 (six) hours as needed for wheezing or shortness of breath. (Patient not taking: Reported on 08/02/2020)    No  facility-administered encounter medications on file as of 08/02/2020.     Objective:   Goals Addressed              This Visit's Progress     Patient Stated   .  "I want to stay healthy" (pt-stated)        CARE PLAN ENTRY (see longitudinal plan of care for additional care plan information)  Current Barriers:  . Social, financial, community barriers:  o None noted at this time. Due to reapply for [facility] assistance through [employer] for 2022  o Using herbal tea, reports that this seems to help her breathing as well.  . Polypharmacy; complex patient with multiple comorbidities including COPD, HTN, CHF, T2DM . Most recent eGFR: >100 mL/min . COPD: Bevespi 9/4.8 mcg 2 puffs BID . Sinus congestion: cetirizine 10 mg daily, fluticasone intranasal PRN . T2DM: last A1c 6.5% per home visit from insurance RN. metformin 1000 mg BID o Glucose readings: fastings 120-130s o Foot exam: DUE o Eye exam: up to date o Nephropathy screening: due  . HTN/CHF: follows w/ Dr. Fletcher Anon, no f/u scheduled. Losartan 25 mg BID, HCTZ 12.5 mg daily, metoprolol succinate 25 mg daily, furosemide 20 mg PRN - though requiring daily. Potassium 10 mEq BID o Not checking home BP readings. Does not have a BP machine. Investigated ordering through OTC benefits, but she used up her benefits this quarter on vitamins. BP elevated earlier this week at pulmonology visit, but she notes that she didn't sit for 5 minutes before the reading . HLD: rosuvastatin 20 mg daily, last LDL at goal <70 . Antiplatelet tx: ASA 81 mg daily . Supplements: Vitamin C, Vitamin E . Vaccinations: received influenza vaccine this week. Up to date on COVID vaccines, will be due for booster in January. Up to date on pneumonia at this time. No hx shingles vaccine.   Pharmacist Clinical Goal(s):  Marland Kitchen Over the next 90 days, patient will work with PharmD and provider towards optimized medication management  Interventions: . Comprehensive medication  review performed; medication list updated in electronic medical record . Inter-disciplinary care team collaboration (see longitudinal plan of care) . Praised for continued control of pulmonary disease. Completed [place] re-enrollment request form for [employer] w/ patient. Will collaborate w/ CPhT on next steps.  . Reviewed benefits of home BP monitoring. She will continue to evaluate if she can afford a home BP monitor w/ OTC benefits . Overdue for follow up and lab work with PCP. Will collaborate w/ office staff to outreach patient to call and schedule.  . Lipids at goal. Encouraged adherence to rosuvastatin  Patient Self Care Activities:  . Patient will take medications as prescribed . Patient will check BG periodically, document, and provide at future appointments . Patient will order BP cuff, check BP at home periodically, document, and provide at future appointments  Please see past updates related to this goal by clicking  on the "Past Updates" button in the selected goal          Plan:  - Scheduled f/u call in ~ 12 weeks  [name], PharmD, [place], [place] Pharmacist [place] [phone]

## 2020-08-03 ENCOUNTER — Telehealth: Payer: Self-pay

## 2020-08-03 NOTE — Telephone Encounter (Signed)
lvm to set up appt and labs asap per Catie.

## 2020-08-10 ENCOUNTER — Ambulatory Visit (INDEPENDENT_AMBULATORY_CARE_PROVIDER_SITE_OTHER): Payer: Medicare Other

## 2020-08-10 VITALS — Ht 66.0 in | Wt 257.0 lb

## 2020-08-10 DIAGNOSIS — Z0000 Encounter for general adult medical examination without abnormal findings: Secondary | ICD-10-CM

## 2020-08-10 NOTE — Patient Instructions (Addendum)
Martha Woodard , Thank you for taking time to come for your Medicare Wellness Visit. I appreciate your ongoing commitment to your health goals. Please review the following plan we discussed and let me know if I can assist you in the future.   These are the goals we discussed: Goals      Patient Stated   .  "I want to stay healthy" (pt-stated)      CARE PLAN ENTRY (see longitudinal plan of care for additional care plan information)  Current Barriers:  . Social, financial, community barriers:  o None noted at this time. Due to reapply for [facility] assistance through [employer] for 2022  o Using herbal tea, reports that this seems to help her breathing as well.  . Polypharmacy; complex patient with multiple comorbidities including COPD, HTN, CHF, T2DM . Most recent eGFR: >100 mL/min . COPD: Bevespi 9/4.8 mcg 2 puffs BID . Sinus congestion: cetirizine 10 mg daily, fluticasone intranasal PRN . T2DM: last A1c 6.5% per home visit from insurance RN. metformin 1000 mg BID o Glucose readings: fastings 120-130s o Foot exam: DUE o Eye exam: up to date o Nephropathy screening: due  . HTN/CHF: follows w/ Dr. Fletcher Anon, no f/u scheduled. Losartan 25 mg BID, HCTZ 12.5 mg daily, metoprolol succinate 25 mg daily, furosemide 20 mg PRN - though requiring daily. Potassium 10 mEq BID o Not checking home BP readings. Does not have a BP machine. Investigated ordering through OTC benefits, but she used up her benefits this quarter on vitamins. BP elevated earlier this week at pulmonology visit, but she notes that she didn't sit for 5 minutes before the reading . HLD: rosuvastatin 20 mg daily, last LDL at goal <70 . Antiplatelet tx: ASA 81 mg daily . Supplements: Vitamin C, Vitamin E . Vaccinations: received influenza vaccine this week. Up to date on COVID vaccines, will be due for booster in January. Up to date on pneumonia at this time. No hx shingles vaccine.   Pharmacist Clinical Goal(s):  Marland Kitchen Over the next 90  days, patient will work with PharmD and provider towards optimized medication management  Interventions: . Comprehensive medication review performed; medication list updated in electronic medical record . Inter-disciplinary care team collaboration (see longitudinal plan of care) . Praised for continued control of pulmonary disease. Completed [place] re-enrollment request form for [employer] w/ patient. Will collaborate w/ CPhT on next steps.  . Reviewed benefits of home BP monitoring. She will continue to evaluate if she can afford a home BP monitor w/ OTC benefits . Overdue for follow up and lab work with PCP. Will collaborate w/ office staff to outreach patient to call and schedule.  . Lipids at goal. Encouraged adherence to rosuvastatin  Patient Self Care Activities:  . Patient will take medications as prescribed . Patient will check BG periodically, document, and provide at future appointments . Patient will order BP cuff, check BP at home periodically, document, and provide at future appointments  Please see past updates related to this goal by clicking on the "Past Updates" button in the selected goal      .  Weight (lb) < 200 lb (90.7 kg) (pt-stated)      Eat healthy - salad, eggs, healthy soup Exercise - walks 20 minutes daily, COPD limits this       This is a list of the screening recommended for you and due dates:  Health Maintenance  Topic Date Due  . Complete foot exam   02/11/2018  .  Pap Smear  02/13/2019  . Hemoglobin A1C  09/06/2020  . Eye exam for diabetics  10/23/2020  . Mammogram  04/24/2022  . Tetanus Vaccine  06/10/2025  . Colon Cancer Screening  12/31/2025  . Flu Shot  Completed  . Pneumococcal vaccine  Completed  . COVID-19 Vaccine  Completed  .  Hepatitis C: One time screening is recommended by Center for Disease Control  (CDC) for  adults born from [birth year] through 1967.   Completed  . HIV Screening  Completed    Immunizations Immunization  History  Administered Date(s) Administered  . Influenza,inj,Quad PF,6+ Mos 07/02/2015, 08/19/2016, 08/07/2017, 06/16/2018, 06/29/2019, 07/31/2020  . Moderna SARS-COVID-2 Vaccination 03/08/2020, 04/05/2020  . Pneumococcal Polysaccharide-23 09/26/2015   Keep all routine maintenance appointments.   Follow up 08/22/20 @ 10:00  Follow up CCM 10/24/19 @ 9:00  Advanced directives: not yet completed  Conditions/risks identified: none new  Follow up in one year for your annual wellness visit.   Preventive Care 40-64 Years, Female Preventive care refers to lifestyle choices and visits with your health care provider that can promote health and wellness. What does preventive care include?  A yearly physical exam. This is also called an annual well check.  Dental exams once or twice a year.  Routine eye exams. Ask your health care provider how often you should have your eyes checked.  Personal lifestyle choices, including:  Daily care of your teeth and gums.  Regular physical activity.  Eating a healthy diet.  Avoiding tobacco and drug use.  Limiting alcohol use.  Practicing safe sex.  Taking low-dose aspirin daily starting at age 12.  Taking vitamin and mineral supplements as recommended by your health care provider. What happens during an annual well check? The services and screenings done by your health care provider during your annual well check will depend on your age, overall health, lifestyle risk factors, and family history of disease. Counseling  Your health care provider may ask you questions about your:  Alcohol use.  Tobacco use.  Drug use.  Emotional well-being.  Home and relationship well-being.  Sexual activity.  Eating habits.  Work and work Statistician.  Method of birth control.  Menstrual cycle.  Pregnancy history. Screening  You may have the following tests or measurements:  Height, weight, and BMI.  Blood pressure.  Lipid and  cholesterol levels. These may be checked every 5 years, or more frequently if you are over 10 years old.  Skin check.  Lung cancer screening. You may have this screening every year starting at age 26 if you have a 30-pack-year history of smoking and currently smoke or have quit within the past 15 years.  Fecal occult blood test (FOBT) of the stool. You may have this test every year starting at age 36.  Flexible sigmoidoscopy or colonoscopy. You may have a sigmoidoscopy every 5 years or a colonoscopy every 10 years starting at age 56.  Hepatitis C blood test.  Hepatitis B blood test.  Sexually transmitted disease (STD) testing.  Diabetes screening. This is done by checking your blood sugar (glucose) after you have not eaten for a while (fasting). You may have this done every 1-3 years.  Mammogram. This may be done every 1-2 years. Talk to your health care provider about when you should start having regular mammograms. This may depend on whether you have a family history of breast cancer.  BRCA-related cancer screening. This may be done if you have a family history of breast, ovarian, tubal,  or peritoneal cancers.  Pelvic exam and Pap test. This may be done every 3 years starting at age 76. Starting at age 57, this may be done every 5 years if you have a Pap test in combination with an HPV test.  Bone density scan. This is done to screen for osteoporosis. You may have this scan if you are at high risk for osteoporosis. Discuss your test results, treatment options, and if necessary, the need for more tests with your health care provider. Vaccines  Your health care provider may recommend certain vaccines, such as:  Influenza vaccine. This is recommended every year.  Tetanus, diphtheria, and acellular pertussis (Tdap, Td) vaccine. You may need a Td booster every 10 years.  Zoster vaccine. You may need this after age 11.  Pneumococcal 13-valent conjugate (PCV13) vaccine. You may need  this if you have certain conditions and were not previously vaccinated.  Pneumococcal polysaccharide (PPSV23) vaccine. You may need one or two doses if you smoke cigarettes or if you have certain conditions. Talk to your health care provider about which screenings and vaccines you need and how often you need them. This information is not intended to replace advice given to you by your health care provider. Make sure you discuss any questions you have with your health care provider. Document Released: 10/12/2015 Document Revised: 06/04/2016 Document Reviewed: 07/17/2015 Elsevier Interactive Patient Education  2017 [place] Prevention in the Home Falls can cause injuries. They can happen to people of all ages. There are many things you can do to make your home safe and to help prevent falls. What can I do on the outside of my home?  Regularly fix the edges of walkways and driveways and fix any cracks.  Remove anything that might make you trip as you walk through a door, such as a raised step or threshold.  Trim any bushes or trees on the path to your home.  Use bright outdoor lighting.  Clear any walking paths of anything that might make someone trip, such as rocks or tools.  Regularly check to see if handrails are loose or broken. Make sure that both sides of any steps have handrails.  Any raised decks and porches should have guardrails on the edges.  Have any leaves, snow, or ice cleared regularly.  Use sand or salt on walking paths during winter.  Clean up any spills in your garage right away. This includes oil or grease spills. What can I do in the bathroom?  Use night lights.  Install grab bars by the toilet and in the tub and shower. Do not use towel bars as grab bars.  Use non-skid mats or decals in the tub or shower.  If you need to sit down in the shower, use a plastic, non-slip stool.  Keep the floor dry. Clean up any water that spills on the floor as  soon as it happens.  Remove soap buildup in the tub or shower regularly.  Attach bath mats securely with double-sided non-slip rug tape.  Do not have throw rugs and other things on the floor that can make you trip. What can I do in the bedroom?  Use night lights.  Make sure that you have a light by your bed that is easy to reach.  Do not use any sheets or blankets that are too big for your bed. They should not hang down onto the floor.  Have a firm chair that has side arms. You  can use this for support while you get dressed.  Do not have throw rugs and other things on the floor that can make you trip. What can I do in the kitchen?  Clean up any spills right away.  Avoid walking on wet floors.  Keep items that you use a lot in easy-to-reach places.  If you need to reach something above you, use a strong step stool that has a grab bar.  Keep electrical cords out of the way.  Do not use floor polish or wax that makes floors slippery. If you must use wax, use non-skid floor wax.  Do not have throw rugs and other things on the floor that can make you trip. What can I do with my stairs?  Do not leave any items on the stairs.  Make sure that there are handrails on both sides of the stairs and use them. Fix handrails that are broken or loose. Make sure that handrails are as long as the stairways.  Check any carpeting to make sure that it is firmly attached to the stairs. Fix any carpet that is loose or worn.  Avoid having throw rugs at the top or bottom of the stairs. If you do have throw rugs, attach them to the floor with carpet tape.  Make sure that you have a light switch at the top of the stairs and the bottom of the stairs. If you do not have them, ask someone to add them for you. What else can I do to help prevent falls?  Wear shoes that:  Do not have high heels.  Have rubber bottoms.  Are comfortable and fit you well.  Are closed at the toe. Do not wear  sandals.  If you use a stepladder:  Make sure that it is fully opened. Do not climb a closed stepladder.  Make sure that both sides of the stepladder are locked into place.  Ask someone to hold it for you, if possible.  Clearly mark and make sure that you can see:  Any grab bars or handrails.  First and last steps.  Where the edge of each step is.  Use tools that help you move around (mobility aids) if they are needed. These include:  Canes.  Walkers.  Scooters.  Crutches.  Turn on the lights when you go into a dark area. Replace any light bulbs as soon as they burn out.  Set up your furniture so you have a clear path. Avoid moving your furniture around.  If any of your floors are uneven, fix them.  If there are any pets around you, be aware of where they are.  Review your medicines with your doctor. Some medicines can make you feel dizzy. This can increase your chance of falling. Ask your doctor what other things that you can do to help prevent falls. This information is not intended to replace advice given to you by your health care provider. Make sure you discuss any questions you have with your health care provider. Document Released: 07/12/2009 Document Revised: 02/21/2016 Document Reviewed: 10/20/2014 Elsevier Interactive Patient Education  2017 [employer].

## 2020-08-10 NOTE — Progress Notes (Signed)
Subjective:   Martha Woodard is a 54 y.o. female who presents for Medicare Annual (Subsequent) preventive examination.  Review of Systems    No ROS.  Medicare Wellness Virtual Visit.   Cardiac Risk Factors include: hypertension;diabetes mellitus     Objective:    Today's Vitals   08/10/20 0836  Weight: 257 lb (116.6 kg)  Height: 5' 6"  (1.676 m)   Body mass index is 41.48 kg/m.  Advanced Directives 08/10/2020 08/10/2019 12/03/2018 12/24/2017 09/25/2015 09/25/2015  Does Patient Have a Medical Advance Directive? No No No No No No  Would patient like information on creating a medical advance directive? No - Patient declined No - Patient declined No - Patient declined Yes (MAU/Ambulatory/Procedural Areas - Information given) Yes - Spiritual care consult ordered -    Current Medications (verified) Outpatient Encounter Medications as of 08/10/2020  Medication Sig  . Accu-Chek Softclix Lancets lancets TEST UP TO 4 TIMES DAILY  . albuterol (PROVENTIL) (2.5 MG/3ML) 0.083% nebulizer solution Take 3 mLs (2.5 mg total) by nebulization every 6 (six) hours as needed for wheezing or shortness of breath. (Patient not taking: Reported on 08/02/2020)  . aspirin EC 81 MG tablet Take 1 tablet (81 mg total) by mouth daily.  . blood glucose meter kit and supplies Dispense based on patient and insurance preference. Use up to four times daily as directed. (FOR ICD-9 250.00, 250.01).  . calcium-vitamin D (OSCAL WITH D) 500-200 MG-UNIT tablet Take 1 tablet by mouth 2 (two) times daily.  . cetirizine (ZYRTEC) 10 MG tablet Take 10 mg by mouth daily.  . fluticasone (FLONASE) 50 MCG/ACT nasal spray USE 1 OR 2 SPRAYS IN EACH NOSTRIL ONCE DAILY.  . furosemide (LASIX) 20 MG tablet TAKE 1 TABLET BY MOUTH  DAILY AS NEEDED  . glucose blood (ACCU-CHEK AVIVA PLUS) test strip TEST UP TO 4 TIMES DAILY AS DIRECTED  . Glycopyrrolate-Formoterol (BEVESPI AEROSPHERE) 9-4.8 MCG/ACT AERO Inhale 2 puffs into the lungs 2 (two)  times daily.  . hydrochlorothiazide (MICROZIDE) 12.5 MG capsule TAKE 1 CAPSULE BY MOUTH  DAILY  . ketoconazole (NIZORAL) 2 % cream Apply 1 application topically daily. (Patient not taking: Reported on 08/02/2020)  . ketoconazole (NIZORAL) 2 % shampoo Apply 1 application topically 2 (two) times a week. (Patient not taking: Reported on 08/02/2020)  . losartan (COZAAR) 25 MG tablet TAKE 1 TABLET BY MOUTH  TWICE DAILY  . metFORMIN (GLUCOPHAGE) 500 MG tablet TAKE 2 TABLETS BY MOUTH  TWICE DAILY WITH MEALS  . metoprolol succinate (TOPROL-XL) 25 MG 24 hr tablet TAKE 1 TABLET BY MOUTH  DAILY  . Multiple Vitamin (MULTIVITAMIN) tablet Take 1 tablet by mouth daily.  Marland Kitchen nystatin cream (MYCOSTATIN) Apply 1 application topically 2 (two) times daily. (Patient not taking: Reported on 08/02/2020)  . ondansetron (ZOFRAN ODT) 4 MG disintegrating tablet Take 1 tablet (4 mg total) by mouth every 8 (eight) hours as needed for nausea or vomiting. (Patient not taking: Reported on 08/02/2020)  . OXYGEN Inhale 2 L into the lungs daily. 2 liters at night, 1 liter when doing activities, and no oxygen when at home during the day.  . potassium chloride (KLOR-CON) 10 MEQ tablet TAKE 1 TABLET BY MOUTH  TWICE DAILY  . PROVENTIL HFA 108 (90 Base) MCG/ACT inhaler Inhale 2 puffs into the lungs every 6 (six) hours as needed for wheezing or shortness of breath. (Patient not taking: Reported on 08/02/2020)  . rosuvastatin (CRESTOR) 20 MG tablet TAKE 1 TABLET BY MOUTH  DAILY  .  vitamin C (ASCORBIC ACID) 500 MG tablet Take 1,000 mg by mouth daily.   . Vitamin E 180 MG CAPS Take 1 capsule by mouth daily.   No facility-administered encounter medications on file as of 08/10/2020.    Allergies (verified) Augmentin [amoxicillin-pot clavulanate]   History: Past Medical History:  Diagnosis Date  . CHF (congestive heart failure) ([place])   . Colon polyp 01-01-2016  . COPD (chronic obstructive pulmonary disease) ([place])   . Diabetes mellitus without  complication ([place])   . Diastolic heart failure ([place])   . HLD (hyperlipidemia)   . Hypertension   . Morbid obesity with BMI of 45.0-49.9, adult ([place])   . Multiple lung nodules on CT   . Shortness of breath dyspnea    Past Surgical History:  Procedure Laterality Date  . BIOPSY THYROID Right 02-06-14   NEGATIVE FOR MALIGNANT CELLS proteinaceous material and macrophages.  Marland Kitchen BREAST BIOPSY Left    neg- core  . COLONOSCOPY WITH PROPOFOL N/A 01/01/2016   Procedure: COLONOSCOPY WITH PROPOFOL;  Surgeon: Lucilla Lame, MD;  Location: [facility] ENDOSCOPY;  Service: Endoscopy;  Laterality: N/A;  . thryoid fna Right April 2015   Proteinaceous material and macrophages.  . WISDOM TOOTH EXTRACTION     Family History  Problem Relation Age of Onset  . Cancer Mother 43       uterian and ovarian   . Goiter Mother   . Stroke Father   . Breast cancer Sister 75  . Stroke Brother   . Breast cancer Sister 52  . Heart murmur Sister    Social History   Socioeconomic History  . Marital status: Single    Spouse name: Not on file  . Number of children: Not on file  . Years of education: Not on file  . Highest education level: Not on file  Occupational History  . Not on file  Tobacco Use  . Smoking status: Former Smoker    Packs/day: 3.00    Years: 33.00    Pack years: 99.00    Quit date: 07/10/2013    Years since quitting: 7.0  . Smokeless tobacco: Never Used  Vaping Use  . Vaping Use: Former  Substance and Sexual Activity  . Alcohol use: No    Alcohol/week: 0.0 standard drinks  . Drug use: No  . Sexual activity: Not Currently  Other Topics Concern  . Not on file  Social History Narrative  . Not on file   Social Determinants of Health   Financial Resource Strain: Low Risk   . Difficulty of Paying Living Expenses: Not very hard  Food Insecurity: No Food Insecurity  . Worried About Charity fundraiser in the Last Year: Never true  . Ran Out of Food in the Last Year: Never true    Transportation Needs: No Transportation Needs  . Lack of Transportation (Medical): No  . Lack of Transportation (Non-Medical): No  Physical Activity:   . Days of Exercise per Week: Not on file  . Minutes of Exercise per Session: Not on file  Stress: No Stress Concern Present  . Feeling of Stress : Not at all  Social Connections:   . Frequency of Communication with Friends and Family: Not on file  . Frequency of Social Gatherings with Friends and Family: Not on file  . Attends Religious Services: Not on file  . Active Member of Clubs or Organizations: Not on file  . Attends Archivist Meetings: Not on file  . Marital Status: Not  on file    Tobacco Counseling Counseling given: Not Answered   Clinical Intake:  Pre-visit preparation completed: Yes        Diabetes: Yes (Followed by pcp)  How often do you need to have someone help you when you read instructions, pamphlets, or other written materials from your doctor or pharmacy?: 1 - Never  Nutrition Risk Assessment: Has the patient had any N/V/D within the last 2 months?  No  Does the patient have any non-healing wounds?  No  Has the patient had any unintentional weight loss or weight gain?  No   Diabetes: If diabetic, was a CBG obtained today?  Yes [id number]. Did the patient bring in their glucometer from home?  No  How often do you monitor your CBG's? daily.   Financial Strains and Diabetes Management: Are you having any financial strains with the device, your supplies or your medication? No .  Followed by Chronic Care Management for management of their diabetes?  Yes  Would the patient like to be referred to a Nutritionist or for Diabetic Management?  No   Diabetic Exams: Diabetic Eye Exam: Completed 10/24/19 Foot exam- due. Denies wounds, changes. Followed by pcp.  Interpreter Needed?: No      Activities of Daily Living In your present state of health, do you have any difficulty performing the following  activities: 08/10/2020  Hearing? N  Vision? N  Difficulty concentrating or making decisions? N  Walking or climbing stairs? N  Comment Walker in use as prn  Dressing or bathing? N  Doing errands, shopping? N  Preparing Food and eating ? N  Using the Toilet? N  In the past six months, have you accidently leaked urine? N  Do you have problems with loss of bowel control? N  Managing your Medications? N  Managing your Finances? N  Housekeeping or managing your Housekeeping? N  Some recent data might be hidden    Patient Care Team: Leone Haven, MD as PCP - General (Family Medicine) Bary Castilla, Forest Gleason, MD (General Surgery) Coral Spikes, DO as Consulting Physician (Family Medicine) Minna Merritts, MD as Consulting Physician (Cardiology) De Hollingshead, RPH-CPP as Pharmacist (Pharmacist)  Indicate any recent Medical Services you may have received from other than [facility] providers in the past year (date may be approximate).     Assessment:   This is a routine wellness examination for Jeanell.  I connected with Jordane today by telephone and verified that I am speaking with the correct person using two identifiers. Location patient: home Location provider: work Persons participating in the virtual visit: patient, Marine scientist.    I discussed the limitations, risks, security and privacy concerns of performing an evaluation and management service by telephone and the availability of in person appointments. The patient expressed understanding and verbally consented to this telephonic visit.    Interactive audio and video telecommunications were attempted between this provider and patient, however failed, due to patient having technical difficulties OR patient did not have access to video capability.  We continued and completed visit with audio only.  Some vital signs may be absent or patient reported.   Hearing/Vision screen  Hearing Screening   125Hz  250Hz  500Hz  1000Hz  2000Hz  3000Hz   4000Hz  6000Hz  8000Hz   Right ear:           Left ear:           Comments: Patient is able to hear conversational tones without difficulty. No issues reported.  Vision Screening Comments:  Wears corrective lenses  Visual acuity not assessed, virtual visit.  They have seen their ophthalmologist in the last 12 months.No retinopathy reported . Followed by Dr. Ellin Mayhew.  Dietary issues and exercise activities discussed: Current Exercise Habits: Home exercise routine, Type of exercise: walking, Intensity: Mild  Low carb  Good water intake   Depression Screen PHQ 2/9 Scores 08/10/2020 02/07/2020 09/19/2019 08/29/2019 08/10/2019 06/03/2019 10/02/2018  PHQ - 2 Score 0 0 0 0 0 0 3  PHQ- 9 Score - - - - - 0 16    Fall Risk Fall Risk  08/10/2020 05/23/2020 02/07/2020 09/19/2019 08/29/2019  Falls in the past year? 0 0 0 0 0  Number falls in past yr: 0 0 0 0 0  Injury with Fall? - 0 - - -  Follow up Falls evaluation completed Falls evaluation completed Falls evaluation completed Falls evaluation completed Falls evaluation completed   Handrails in use when climbing stairs? Yes Home free of loose throw rugs in walkways, pet beds, electrical cords, etc? Yes  Adequate lighting in your home to reduce risk of falls? Yes   ASSISTIVE DEVICES UTILIZED TO PREVENT FALLS: Life alert? No  Use of a cane, walker or w/c? Yes , as needed Grab bars in the bathroom? No  Shower chair or bench in shower? No  Elevated toilet seat or a handicapped toilet? No   TIMED UP AND GO: Was the test performed? No . Virtual visit.   Cognitive Function: MMSE - Mini Mental State Exam 12/24/2017  Orientation to time 5  Orientation to Place 5  Registration 3  Attention/ Calculation 5  Recall 2  Language- name 2 objects 2  Language- repeat 1  Language- follow 3 step command 3  Language- read & follow direction 1  Write a sentence 1  Copy design 1  Total score 29     6CIT Screen 08/10/2020 08/10/2019 12/24/2017  What Year?  0 points 0 points 0 points  What month? 0 points 0 points 0 points  What time? 0 points 0 points 0 points  Count back from 20 0 points 0 points 0 points  Months in reverse 0 points 0 points 0 points  Repeat phrase 0 points 0 points 0 points  Total Score 0 0 0    Immunizations Immunization History  Administered Date(s) Administered  . Influenza,inj,Quad PF,6+ Mos 07/02/2015, 08/19/2016, 08/07/2017, 06/16/2018, 06/29/2019, 07/31/2020  . Moderna SARS-COVID-2 Vaccination 03/08/2020, 04/05/2020  . Pneumococcal Polysaccharide-23 09/26/2015   Health Maintenance Health Maintenance  Topic Date Due  . FOOT EXAM  02/11/2018  . PAP SMEAR-Modifier  02/13/2019  . HEMOGLOBIN A1C  09/06/2020  . OPHTHALMOLOGY EXAM  10/23/2020  . MAMMOGRAM  04/24/2022  . TETANUS/TDAP  06/10/2025  . COLONOSCOPY  12/31/2025  . INFLUENZA VACCINE  Completed  . PNEUMOCOCCAL POLYSACCHARIDE VACCINE AGE 67-64 HIGH RISK  Completed  . COVID-19 Vaccine  Completed  . Hepatitis C Screening  Completed  . HIV Screening  Completed    Colorectal cancer screening: Completed 01/01/16. Repeat every 10 years  Mammogram status: Completed 04/24/20. Repeat every year   Bone density- does not qualify.  DG Chest 2 view Screening: 04/05/20  Hepatitis C Screening: Completed 09/23/17.  Pap Smear- due. Plans to verify insurance coverage and schedule with OB/GYN. Deferred.   Vision Screening: Recommended annual ophthalmology exams for early detection of glaucoma and other disorders of the eye. Is the patient up to date with their annual eye exam?  Yes  Who is the provider or what  is the name of the office in which the patient attends annual eye exams? Dr. Ellin Mayhew. I Dental Screening: Recommended annual dental exams for proper oral hygiene  Community Resource Referral / Chronic Care Management: CRR required this visit?  No   CCM required this visit?  No      Plan:   Keep all routine maintenance appointments.   Follow up 08/22/20  @ 10:00  Follow up CCM 10/24/19 @ 9:00  I have personally reviewed and noted the following in the patient's chart:   . Medical and social history . Use of alcohol, tobacco or illicit drugs  . Current medications and supplements . Functional ability and status . Nutritional status . Physical activity . Advanced directives . List of other physicians . Hospitalizations, surgeries, and ER visits in previous 12 months . Vitals . Screenings to include cognitive, depression, and falls . Referrals and appointments  In addition, I have reviewed and discussed with patient certain preventive protocols, quality metrics, and best practice recommendations. A written personalized care plan for preventive services as well as general preventive health recommendations were provided to patient via mail.     Varney Biles, LPN   [id number]/08/[id number]

## 2020-08-16 NOTE — Progress Notes (Signed)
I have reviewed the above note and agree.  Ashley Bultema, M.D.  

## 2020-08-20 ENCOUNTER — Other Ambulatory Visit: Payer: Self-pay

## 2020-08-22 ENCOUNTER — Other Ambulatory Visit: Payer: Self-pay

## 2020-08-22 ENCOUNTER — Encounter: Payer: Self-pay | Admitting: Family Medicine

## 2020-08-22 ENCOUNTER — Ambulatory Visit (INDEPENDENT_AMBULATORY_CARE_PROVIDER_SITE_OTHER): Payer: Medicare Other | Admitting: Family Medicine

## 2020-08-22 VITALS — BP 120/80 | HR 91 | Temp 99.1°F | Ht 66.0 in | Wt 252.4 lb

## 2020-08-22 DIAGNOSIS — E1159 Type 2 diabetes mellitus with other circulatory complications: Secondary | ICD-10-CM

## 2020-08-22 DIAGNOSIS — E782 Mixed hyperlipidemia: Secondary | ICD-10-CM | POA: Diagnosis not present

## 2020-08-22 DIAGNOSIS — J9611 Chronic respiratory failure with hypoxia: Secondary | ICD-10-CM | POA: Diagnosis not present

## 2020-08-22 DIAGNOSIS — I872 Venous insufficiency (chronic) (peripheral): Secondary | ICD-10-CM

## 2020-08-22 DIAGNOSIS — J439 Emphysema, unspecified: Secondary | ICD-10-CM

## 2020-08-22 DIAGNOSIS — R600 Localized edema: Secondary | ICD-10-CM | POA: Diagnosis not present

## 2020-08-22 DIAGNOSIS — J309 Allergic rhinitis, unspecified: Secondary | ICD-10-CM

## 2020-08-22 DIAGNOSIS — I10 Essential (primary) hypertension: Secondary | ICD-10-CM

## 2020-08-22 DIAGNOSIS — I5032 Chronic diastolic (congestive) heart failure: Secondary | ICD-10-CM

## 2020-08-22 DIAGNOSIS — J9612 Chronic respiratory failure with hypercapnia: Secondary | ICD-10-CM

## 2020-08-22 LAB — CBC
HCT: 43.8 % (ref 36.0–46.0)
Hemoglobin: 14.2 g/dL (ref 12.0–15.0)
MCHC: 32.4 g/dL (ref 30.0–36.0)
MCV: 83.6 fl (ref 78.0–100.0)
Platelets: 261 10*3/uL (ref 150.0–400.0)
RBC: 5.24 Mil/uL — ABNORMAL HIGH (ref 3.87–5.11)
RDW: 14.8 % (ref 11.5–15.5)
WBC: 5.4 10*3/uL (ref 4.0–10.5)

## 2020-08-22 LAB — LIPID PANEL
Cholesterol: 99 mg/dL (ref 0–200)
HDL: 39.7 mg/dL (ref >39.00)
LDL Cholesterol: 33 mg/dL (ref 0–99)
NonHDL: 59.05
Total CHOL/HDL Ratio: 2
Triglycerides: 131 mg/dL (ref 0.0–149.0)
VLDL: 26.2 mg/dL (ref 0.0–40.0)

## 2020-08-22 LAB — TSH: TSH: 0.87 u[IU]/mL (ref 0.35–4.50)

## 2020-08-22 LAB — COMPREHENSIVE METABOLIC PANEL
ALT: 37 U/L — ABNORMAL HIGH (ref 0–35)
AST: 49 U/L — ABNORMAL HIGH (ref 0–37)
Albumin: 4.3 g/dL (ref 3.5–5.2)
Alkaline Phosphatase: 49 U/L (ref 39–117)
BUN: 12 mg/dL (ref 6–23)
CO2: 36 mEq/L — ABNORMAL HIGH (ref 19–32)
Calcium: 9.7 mg/dL (ref 8.4–10.5)
Chloride: 99 mEq/L (ref 96–112)
Creatinine, Ser: 0.59 mg/dL (ref 0.40–1.20)
GFR: 101.09 mL/min (ref >60.00)
Glucose, Bld: 95 mg/dL (ref 70–99)
Potassium: 4.5 mEq/L (ref 3.5–5.1)
Sodium: 141 mEq/L (ref 135–145)
Total Bilirubin: 0.4 mg/dL (ref 0.2–1.2)
Total Protein: 6.7 g/dL (ref 6.0–8.3)

## 2020-08-22 LAB — HEMOGLOBIN A1C: Hgb A1c MFr Bld: 6.7 % — ABNORMAL HIGH (ref 4.6–6.5)

## 2020-08-22 MED ORDER — MONTELUKAST SODIUM 10 MG PO TABS: 10.0000 mg | ORAL_TABLET | Freq: Every day | ORAL | 3 refills | Status: DC

## 2020-08-22 NOTE — Assessment & Plan Note (Signed)
Stable on 1 L of oxygen.

## 2020-08-22 NOTE — Assessment & Plan Note (Signed)
Patient's weight is relatively stable.  I suspect her swelling is more likely related to venous insufficiency.  She will monitor.

## 2020-08-22 NOTE — Patient Instructions (Signed)
Nice to see you. We are going to get lab work today and contact you with the results. Please start on Singulair for your allergy issues.

## 2020-08-22 NOTE — Assessment & Plan Note (Signed)
Stable.  She will continue Bevespi.  She can continue as needed albuterol.

## 2020-08-22 NOTE — Assessment & Plan Note (Signed)
Seems to be well controlled.  Check A1c.  She will continue Metformin 1000 mg twice daily.

## 2020-08-22 NOTE — Progress Notes (Signed)
[name], MD Phone: [phone]  Martha Woodard is a 54 y.o. female who presents today for f/u.  HYPERTENSION  Disease Monitoring  Home BP Monitoring not checking Chest pain- no    Dyspnea- no Medications  Compliance-  Taking HCTZ, losartan, metoprolol.  Edema- mild bilateral R>L, resolves over night, chronically sleeps in a recliner, no change to this, no PND  DIABETES Disease Monitoring: Blood Sugar ranges-<140 fasting Polyuria/phagia/dipsia- no      Optho- UTD - has retinopathy Medications: Compliance- taking metformin Hypoglycemic symptoms- no  Allergic rhinitis: Chronic issues.  Has chronic postnasal drip and some sneezing.  Intermittently has issues with sinus infections.  Taking Zyrtec and Flonase.  COPD: Currently on Bevespi.  Notes this has been much more beneficial in her mouth does not break out.  She has not required much albuterol recently.  Does follow with pulmonology.     Social History   Tobacco Use  Smoking Status Former Smoker  . Packs/day: 3.00  . Years: 33.00  . Pack years: 99.00  . Quit date: 07/10/2013  . Years since quitting: 7.1  Smokeless Tobacco Never Used     ROS see history of present illness  Objective  Physical Exam Vitals:   08/22/20 0956  BP: 120/80  Pulse: 91  Temp: 99.1 F (37.3 C)  SpO2: 96%    BP Readings from Last 3 Encounters:  08/22/20 120/80  07/31/20 140/80  04/05/20 134/70   Wt Readings from Last 3 Encounters:  08/22/20 252 lb 6.4 oz (114.5 kg)  08/10/20 257 lb (116.6 kg)  07/31/20 257 lb 6.4 oz (116.8 kg)    Physical Exam Constitutional:      General: She is not in acute distress.    Appearance: She is not diaphoretic.  Cardiovascular:     Rate and Rhythm: Normal rate and regular rhythm.     Heart sounds: Normal heart sounds.  Pulmonary:     Effort: Pulmonary effort is normal.     Breath sounds: Normal breath sounds.  Musculoskeletal:     Right lower leg: No edema.     Left lower leg: No  edema.  Skin:    General: Skin is warm and dry.  Neurological:     Mental Status: She is alert.    Diabetic Foot Exam - Simple   Simple Foot Form Diabetic Foot exam was performed with the following findings: Yes 08/22/2020 10:11 AM  Visual Inspection No deformities, no ulcerations, no other skin breakdown bilaterally: Yes Sensation Testing Intact to touch and monofilament testing bilaterally: Yes Pulse Check Posterior Tibialis and Dorsalis pulse intact bilaterally: Yes Comments      Assessment/Plan: Please see individual problem list.  Problem List Items Addressed This Visit    Allergic rhinitis    Chronic issue.  We will add Singulair 10 mg once daily.  She will continue Flonase and Zyrtec.      Relevant Medications   montelukast (SINGULAIR) 10 MG tablet   Chronic diastolic heart failure ([place])    Patient's weight is relatively stable.  I suspect her swelling is more likely related to venous insufficiency.  She will monitor.      Chronic respiratory failure ([facility])    Stable on 1 L of oxygen.      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  She will continue Bevespi.  She can continue as needed albuterol.      Relevant Medications   montelukast (SINGULAIR) 10 MG tablet  DM type 2 (diabetes mellitus, type 2) ([place])    Seems to be well controlled.  Check A1c.  She will continue Metformin 1000 mg twice daily.      Relevant Orders   HgB A1c   Essential hypertension - Primary    Adequately controlled.  She will continue HCTZ 12.5 mg once daily, losartan 25 mg once daily, and metoprolol 25 mg once daily.  Check labs.      Relevant Orders   Comp Met (CMET)   Hyperlipidemia   Relevant Orders   Lipid panel   Venous insufficiency    I suspect this is the cause of her intermittent swelling though we will check lab work to rule out other causes.       Other Visit Diagnoses    Pedal edema       Relevant Orders   CBC   TSH       This visit occurred during the SARS-CoV-2 public health emergency.  Safety protocols were in place, including screening questions prior to the visit, additional usage of staff PPE, and extensive cleaning of exam room while observing appropriate contact time as indicated for disinfecting solutions.    [name], MD [place]

## 2020-08-22 NOTE — Assessment & Plan Note (Signed)
I suspect this is the cause of her intermittent swelling though we will check lab work to rule out other causes.

## 2020-08-22 NOTE — Assessment & Plan Note (Signed)
Adequately controlled.  She will continue HCTZ 12.5 mg once daily, losartan 25 mg once daily, and metoprolol 25 mg once daily.  Check labs.

## 2020-08-22 NOTE — Assessment & Plan Note (Signed)
Chronic issue.  We will add Singulair 10 mg once daily.  She will continue Flonase and Zyrtec.

## 2020-08-30 DIAGNOSIS — J449 Chronic obstructive pulmonary disease, unspecified: Secondary | ICD-10-CM | POA: Diagnosis not present

## 2020-09-05 ENCOUNTER — Other Ambulatory Visit: Payer: Self-pay | Admitting: Family Medicine

## 2020-09-05 DIAGNOSIS — R945 Abnormal results of liver function studies: Secondary | ICD-10-CM

## 2020-09-05 DIAGNOSIS — R7989 Other specified abnormal findings of blood chemistry: Secondary | ICD-10-CM

## 2020-09-19 ENCOUNTER — Other Ambulatory Visit: Payer: Self-pay | Admitting: Family Medicine

## 2020-09-19 DIAGNOSIS — J309 Allergic rhinitis, unspecified: Secondary | ICD-10-CM

## 2020-09-20 ENCOUNTER — Other Ambulatory Visit (INDEPENDENT_AMBULATORY_CARE_PROVIDER_SITE_OTHER): Payer: Medicare Other

## 2020-09-20 ENCOUNTER — Other Ambulatory Visit: Payer: Self-pay

## 2020-09-20 DIAGNOSIS — R7989 Other specified abnormal findings of blood chemistry: Secondary | ICD-10-CM

## 2020-09-20 DIAGNOSIS — R945 Abnormal results of liver function studies: Secondary | ICD-10-CM | POA: Diagnosis not present

## 2020-09-20 LAB — HEPATIC FUNCTION PANEL
ALT: 35 U/L (ref 0–35)
AST: 37 U/L (ref 0–37)
Albumin: 4.3 g/dL (ref 3.5–5.2)
Alkaline Phosphatase: 56 U/L (ref 39–117)
Bilirubin, Direct: 0.1 mg/dL (ref 0.0–0.3)
Total Bilirubin: 0.4 mg/dL (ref 0.2–1.2)
Total Protein: 6.9 g/dL (ref 6.0–8.3)

## 2020-09-30 DIAGNOSIS — J449 Chronic obstructive pulmonary disease, unspecified: Secondary | ICD-10-CM | POA: Diagnosis not present

## 2020-10-05 ENCOUNTER — Other Ambulatory Visit: Payer: Self-pay | Admitting: Cardiovascular Disease

## 2020-10-05 ENCOUNTER — Other Ambulatory Visit: Payer: Self-pay | Admitting: Family Medicine

## 2020-10-05 NOTE — Telephone Encounter (Signed)
Scheduled for 2/7

## 2020-10-05 NOTE — Telephone Encounter (Signed)
Please schedule 12 month F/U appointment. Thank you! 

## 2020-10-23 ENCOUNTER — Other Ambulatory Visit (INDEPENDENT_AMBULATORY_CARE_PROVIDER_SITE_OTHER): Payer: Medicare Other

## 2020-10-23 ENCOUNTER — Telehealth: Payer: Medicare Other

## 2020-10-23 ENCOUNTER — Telehealth (INDEPENDENT_AMBULATORY_CARE_PROVIDER_SITE_OTHER): Payer: Medicare Other | Admitting: Internal Medicine

## 2020-10-23 ENCOUNTER — Other Ambulatory Visit: Payer: Self-pay

## 2020-10-23 ENCOUNTER — Encounter: Payer: Self-pay | Admitting: Internal Medicine

## 2020-10-23 VITALS — Ht 66.0 in | Wt 250.0 lb

## 2020-10-23 DIAGNOSIS — Z20822 Contact with and (suspected) exposure to covid-19: Secondary | ICD-10-CM

## 2020-10-23 DIAGNOSIS — J441 Chronic obstructive pulmonary disease with (acute) exacerbation: Secondary | ICD-10-CM | POA: Diagnosis not present

## 2020-10-23 DIAGNOSIS — J0111 Acute recurrent frontal sinusitis: Secondary | ICD-10-CM | POA: Diagnosis not present

## 2020-10-23 DIAGNOSIS — J029 Acute pharyngitis, unspecified: Secondary | ICD-10-CM | POA: Diagnosis not present

## 2020-10-23 LAB — POCT INFLUENZA A/B
Influenza A, POC: NEGATIVE
Influenza B, POC: NEGATIVE

## 2020-10-23 LAB — POCT RAPID STREP A (OFFICE): Rapid Strep A Screen: NEGATIVE

## 2020-10-23 MED ORDER — DM-GUAIFENESIN ER 60-1200 MG PO TB12: 1.0000 | ORAL_TABLET | Freq: Two times a day (BID) | ORAL | 2 refills | Status: DC

## 2020-10-23 MED ORDER — PROVENTIL HFA 108 (90 BASE) MCG/ACT IN AERS: 2.0000 | INHALATION_SPRAY | Freq: Four times a day (QID) | RESPIRATORY_TRACT | 1 refills | Status: DC | PRN

## 2020-10-23 MED ORDER — SALINE SPRAY 0.65 % NA SOLN: 2.0000 | NASAL | 11 refills | Status: AC | PRN

## 2020-10-23 MED ORDER — PREDNISONE 20 MG PO TABS: 40.0000 mg | ORAL_TABLET | Freq: Every day | ORAL | 0 refills | Status: DC

## 2020-10-23 MED ORDER — AZITHROMYCIN 250 MG PO TABS: ORAL_TABLET | ORAL | 0 refills | Status: DC

## 2020-10-23 MED ORDER — ALBUTEROL SULFATE (2.5 MG/3ML) 0.083% IN NEBU: 2.5000 mg | INHALATION_SOLUTION | Freq: Four times a day (QID) | RESPIRATORY_TRACT | 12 refills | Status: DC | PRN

## 2020-10-23 NOTE — Progress Notes (Signed)
Symptoms include facial pain/pressure, drainage into the throat, swollen sinuses, yellow mucous.   No one around the Patient is sick. Onset of symptoms this past weekend.

## 2020-10-23 NOTE — Progress Notes (Signed)
Telephone Note  I connected with Martha Woodard  on 10/23/20 at  1:10 PM EST by a telephone and verified that I am speaking with the correct person using two identifiers.  Location patient: home, Why Location provider:work or home office Persons participating in the virtual visit: patient, provider  I discussed the limitations of evaluation and management by telemedicine and the availability of in person appointments. The patient expressed understanding and agreed to proceed.   HPI: >1 week sinus pain and pressure b/l eyes, ha sob with exertion, chest pain with coughing runny nose with yellow phelgm sore throat, PND, tried neb tx and has proventil and on bevespbi has cough and PND with white yellow thick drainage  Denies fever no covid exposure she knows of  H/o COPD on 1L O2 daytime and 2 L QHS Tried honey lemon cough drops and nyquil and neb tx but appears they are expired, also taking otc allergy pill  ENT saw ~2 years ago and sinuses normal per pt she reports this happens yearly   -COVID-19 vaccine status: vaccinated 2/2 pending booster  ROS: See pertinent positives and negatives per HPI.  Past Medical History:  Diagnosis Date  . CHF (congestive heart failure) ([place])   . Colon polyp 01-01-2016  . COPD (chronic obstructive pulmonary disease) ([place])   . Diabetes mellitus without complication ([place])   . Diastolic heart failure ([place])   . HLD (hyperlipidemia)   . Hypertension   . Morbid obesity with BMI of 45.0-49.9, adult ([place])   . Multiple lung nodules on CT   . Shortness of breath dyspnea     Past Surgical History:  Procedure Laterality Date  . BIOPSY THYROID Right 02-06-14   NEGATIVE FOR MALIGNANT CELLS proteinaceous material and macrophages.  Marland Kitchen BREAST BIOPSY Left    neg- core  . COLONOSCOPY WITH PROPOFOL N/A 01/01/2016   Procedure: COLONOSCOPY WITH PROPOFOL;  Surgeon: Lucilla Lame, MD;  Location: [facility] ENDOSCOPY;  Service: Endoscopy;  Laterality: N/A;  . thryoid fna Right April  2015   Proteinaceous material and macrophages.  . WISDOM TOOTH EXTRACTION       Current Outpatient Medications:  .  Accu-Chek Softclix Lancets lancets, TEST UP TO 4 TIMES DAILY, Disp: 400 each, Rfl: 3 .  aspirin EC 81 MG tablet, Take 1 tablet (81 mg total) by mouth daily., Disp: 90 tablet, Rfl: 3 .  azithromycin (ZITHROMAX) 250 MG tablet, 2 pills day 1 and 1 pill day 2-5 with food, Disp: 6 tablet, Rfl: 0 .  blood glucose meter kit and supplies, Dispense based on patient and insurance preference. Use up to four times daily as directed. (FOR ICD-9 250.00, 250.01)., Disp: 1 each, Rfl: 0 .  calcium-vitamin D (OSCAL WITH D) 500-200 MG-UNIT tablet, Take 1 tablet by mouth 2 (two) times daily., Disp: , Rfl:  .  cetirizine (ZYRTEC) 10 MG tablet, Take 10 mg by mouth daily., Disp: , Rfl:  .  Dextromethorphan-Guaifenesin 60-1200 MG 12hr tablet, Take 1 tablet by mouth every 12 (twelve) hours., Disp: 60 tablet, Rfl: 2 .  fluticasone (FLONASE) 50 MCG/ACT nasal spray, USE 1 OR 2 SPRAYS IN EACH NOSTRIL ONCE DAILY., Disp: 16 g, Rfl: 12 .  furosemide (LASIX) 20 MG tablet, TAKE 1 TABLET BY MOUTH  DAILY AS NEEDED, Disp: 90 tablet, Rfl: 0 .  glucose blood (ACCU-CHEK AVIVA PLUS) test strip, TEST UP TO 4 TIMES DAILY AS DIRECTED, Disp: 100 strip, Rfl: 13 .  Glycopyrrolate-Formoterol (BEVESPI AEROSPHERE) 9-4.8 MCG/ACT AERO, Inhale 2 puffs into the  lungs 2 (two) times daily., Disp: 10.7 g, Rfl: 11 .  hydrochlorothiazide (MICROZIDE) 12.5 MG capsule, TAKE 1 CAPSULE BY MOUTH  DAILY, Disp: 90 capsule, Rfl: 3 .  ketoconazole (NIZORAL) 2 % cream, Apply 1 application topically daily., Disp: 15 g, Rfl: 0 .  losartan (COZAAR) 25 MG tablet, TAKE 1 TABLET BY MOUTH  TWICE DAILY, Disp: 180 tablet, Rfl: 0 .  metFORMIN (GLUCOPHAGE) 500 MG tablet, TAKE 2 TABLETS BY MOUTH  TWICE DAILY WITH MEALS, Disp: 360 tablet, Rfl: 3 .  metoprolol succinate (TOPROL-XL) 25 MG 24 hr tablet, TAKE 1 TABLET BY MOUTH  DAILY, Disp: 90 tablet, Rfl: 3 .   montelukast (SINGULAIR) 10 MG tablet, TAKE 1 TABLET BY MOUTH AT BEDTIME, Disp: 30 tablet, Rfl: 3 .  Multiple Vitamin (MULTIVITAMIN) tablet, Take 1 tablet by mouth daily., Disp: , Rfl:  .  nystatin cream (MYCOSTATIN), Apply 1 application topically 2 (two) times daily., Disp: 30 g, Rfl: 0 .  ondansetron (ZOFRAN ODT) 4 MG disintegrating tablet, Take 1 tablet (4 mg total) by mouth every 8 (eight) hours as needed for nausea or vomiting., Disp: 20 tablet, Rfl: 0 .  OXYGEN, Inhale 2 L into the lungs daily. 2 liters at night, 1 liter when doing activities, and no oxygen when at home during the day., Disp: , Rfl:  .  potassium chloride (KLOR-CON) 10 MEQ tablet, TAKE 1 TABLET BY MOUTH  TWICE DAILY, Disp: 180 tablet, Rfl: 0 .  predniSONE (DELTASONE) 20 MG tablet, Take 2 tablets (40 mg total) by mouth daily with breakfast., Disp: 14 tablet, Rfl: 0 .  rosuvastatin (CRESTOR) 20 MG tablet, TAKE 1 TABLET BY MOUTH  DAILY, Disp: 90 tablet, Rfl: 3 .  sodium chloride (OCEAN) 0.65 % SOLN nasal spray, Place 2 sprays into both nostrils as needed for congestion., Disp: 30 mL, Rfl: 11 .  vitamin C (ASCORBIC ACID) 500 MG tablet, Take 1,000 mg by mouth daily. , Disp: , Rfl:  .  Vitamin E 180 MG CAPS, Take 1 capsule by mouth daily., Disp: , Rfl:  .  albuterol (PROVENTIL) (2.5 MG/3ML) 0.083% nebulizer solution, Take 3 mLs (2.5 mg total) by nebulization every 6 (six) hours as needed for wheezing or shortness of breath., Disp: 75 mL, Rfl: 12 .  ketoconazole (NIZORAL) 2 % shampoo, Apply 1 application topically 2 (two) times a week. (Patient not taking: Reported on 10/23/2020), Disp: 120 mL, Rfl: 0 .  PROVENTIL HFA 108 (90 Base) MCG/ACT inhaler, Inhale 2 puffs into the lungs every 6 (six) hours as needed for wheezing or shortness of breath. 18 gram inhaler, Disp: 3 each, Rfl: 1  EXAM:  VITALS per patient if applicable:  GENERAL: alert, oriented, appears well and in no acute distress  PSYCH/NEURO: pleasant and cooperative, no  obvious depression or anxiety, speech and thought processing grossly intact  ASSESSMENT AND PLAN:  Discussed the following assessment and plan:  COPD exacerbation ([place]) - Plan: Novel Coronavirus, NAA ([facility]), POCT Influenza A/B, POCT rapid strep A, PROVENTIL HFA 108 (90 Base) MCG/ACT inhaler, albuterol (PROVENTIL) (2.5 MG/3ML) 0.083% nebulizer solution, predniSONE (DELTASONE) 20 MG tablet, sodium chloride (OCEAN) 0.65 % SOLN nasal spray, Dextromethorphan-Guaifenesin 60-1200 MG 12hr tablet, azithromycin (ZITHROMAX) 250 MG tablet  Exposure to COVID-19 virus - Plan: Novel Coronavirus, NAA ([facility]), POCT Influenza A/B, POCT rapid strep A  Sore throat - Plan: Novel Coronavirus, NAA ([facility]), POCT Influenza A/B, POCT rapid strep A, azithromycin (ZITHROMAX) 250 MG tablet  Acute recurrent frontal sinusitis - Plan: predniSONE (DELTASONE) 20 MG  tablet, azithromycin (ZITHROMAX) 250 MG tablet  F/u PCP  And Catie needs help with inhalers cost  F/u pulm   -we discussed possible serious and likely etiologies, options for evaluation and workup, limitations of telemedicine visit vs in person visit, treatment, treatment risks and precautions.  I discussed the assessment and treatment plan with the patient. The patient was provided an opportunity to ask questions and all were answered. The patient agreed with the plan and demonstrated an understanding of the instructions.    Time spent 20 min Delorise Jackson, MD

## 2020-10-25 LAB — SARS-COV-2, NAA 2 DAY TAT

## 2020-10-25 LAB — NOVEL CORONAVIRUS, NAA: SARS-CoV-2, NAA: NOT DETECTED

## 2020-10-31 ENCOUNTER — Ambulatory Visit (INDEPENDENT_AMBULATORY_CARE_PROVIDER_SITE_OTHER): Payer: Medicare Other | Admitting: Pharmacist

## 2020-10-31 DIAGNOSIS — E782 Mixed hyperlipidemia: Secondary | ICD-10-CM | POA: Diagnosis not present

## 2020-10-31 DIAGNOSIS — J439 Emphysema, unspecified: Secondary | ICD-10-CM

## 2020-10-31 DIAGNOSIS — I5032 Chronic diastolic (congestive) heart failure: Secondary | ICD-10-CM

## 2020-10-31 DIAGNOSIS — J309 Allergic rhinitis, unspecified: Secondary | ICD-10-CM

## 2020-10-31 DIAGNOSIS — I10 Essential (primary) hypertension: Secondary | ICD-10-CM

## 2020-10-31 DIAGNOSIS — J449 Chronic obstructive pulmonary disease, unspecified: Secondary | ICD-10-CM | POA: Diagnosis not present

## 2020-10-31 DIAGNOSIS — E1159 Type 2 diabetes mellitus with other circulatory complications: Secondary | ICD-10-CM

## 2020-10-31 NOTE — Chronic Care Management (AMB) (Signed)
Chronic Care Management Pharmacy Note  10/31/2020 Name:  Martha Woodard MRN:  [id number] DOB:  Aug 24, 1966  Subjective: Martha Woodard is an 55 y.o. year old female who is a primary patient of Sonnenberg, Angela Adam, MD.  The CCM team was consulted for assistance with disease management and care coordination needs.    Engaged with patient by telephone for follow up visit in response to provider referral for pharmacy case management and/or care coordination services.   Consent to Services:  The patient was given information about Chronic Care Management services, agreed to services, and gave verbal consent prior to initiation of services.  Please see initial visit note for detailed documentation.   Objective:  Lab Results  Component Value Date   CREATININE 0.59 08/22/2020   CREATININE 0.58 06/29/2019   CREATININE 0.54 12/07/2018    Lab Results  Component Value Date   HGBA1C 6.7 (H) 08/22/2020       Component Value Date/Time   CHOL 99 08/22/2020 1019   TRIG 131.0 08/22/2020 1019   HDL 39.70 08/22/2020 1019   CHOLHDL 2 08/22/2020 1019   VLDL 26.2 08/22/2020 1019   LDLCALC 33 08/22/2020 1019   LDLDIRECT 57.0 07/27/2018 0920     BP Readings from Last 3 Encounters:  08/22/20 120/80  07/31/20 140/80  04/05/20 134/70    Assessment: Review of patient past medical history, allergies, medications, health status, including review of consultants reports, laboratory and other test data, was performed as part of comprehensive evaluation and provision of chronic care management services.   SDOH:  (Social Determinants of Health) assessments and interventions performed:  SDOH Interventions   Flowsheet Row Most Recent Value  SDOH Interventions   Financial Strain Interventions Other (Comment)  [manufacturer assistance]      CCM Care Plan  Allergies  Allergen Reactions  . Augmentin [Amoxicillin-Pot Clavulanate] Itching    Hives Can take PCN and Amoxicillin alone      Medications Reviewed Today    Reviewed by De Hollingshead, RPH-CPP (Pharmacist) on 10/31/20 at 1441  Med List Status: <None>  Medication Order Taking? Sig Documenting Provider Last Dose Status Informant  Accu-Chek Softclix Lancets lancets [id number] Yes TEST UP TO 4 TIMES DAILY Leone Haven, MD Taking Active   albuterol (PROVENTIL) (2.5 MG/3ML) 0.083% nebulizer solution [id number] Yes Take 3 mLs (2.5 mg total) by nebulization every 6 (six) hours as needed for wheezing or shortness of breath. McLean-Scocuzza, Nino Glow, MD Taking Active   aspirin EC 81 MG tablet [id number] Yes Take 1 tablet (81 mg total) by mouth daily. Coral Spikes, DO Taking Active Self  blood glucose meter kit and supplies [id number] Yes Dispense based on patient and insurance preference. Use up to four times daily as directed. (FOR ICD-9 250.00, 250.01). Coral Spikes, DO Taking Active Self  calcium-vitamin D (OSCAL WITH D) 500-200 MG-UNIT tablet [id number] Yes Take 1 tablet by mouth 2 (two) times daily. [provider] Taking Active   cetirizine (ZYRTEC) 10 MG tablet [id number] Yes Take 10 mg by mouth daily. [provider] Taking Active   Dextromethorphan-Guaifenesin 60-1200 MG 12hr tablet [id number]  Take 1 tablet by mouth every 12 (twelve) hours. McLean-Scocuzza, Nino Glow, MD  Active   fluticasone (FLONASE) 50 MCG/ACT nasal spray [id number] Yes USE 1 OR 2 SPRAYS IN EACH NOSTRIL ONCE DAILY. Leone Haven, MD Taking Active Self           Med Note Darnelle Maffucci, Arville Lime   Wed Oct 31, 2020  2:35 PM) Using 1 spray BID  furosemide (LASIX) 20 MG tablet [id number] Yes TAKE 1 TABLET BY MOUTH  DAILY AS NEEDED Rockey Situ Kathlene November, MD Taking Active            Med Note De Hollingshead   Wed Oct 31, 2020  2:36 PM) Usually QAM  glucose blood (ACCU-CHEK AVIVA PLUS) test strip [id number] Yes TEST UP TO 4 TIMES DAILY AS DIRECTED Leone Haven, MD Taking Active   Glycopyrrolate-Formoterol (BEVESPI AEROSPHERE)  9-4.8 MCG/ACT Hollie Salk [id number] Yes Inhale 2 puffs into the lungs 2 (two) times daily. Tyler Pita, MD Taking Active   hydrochlorothiazide (MICROZIDE) 12.5 MG capsule [id number] Yes TAKE 1 CAPSULE BY MOUTH  DAILY Leone Haven, MD Taking Active   ketoconazole (NIZORAL) 2 % cream [id number] Yes Apply 1 application topically daily. Leone Haven, MD Taking Active Self  ketoconazole (NIZORAL) 2 % shampoo [id number] No Apply 1 application topically 2 (two) times a week.  Patient not taking: No sig reported   Leone Haven, MD Not Taking Active   losartan (COZAAR) 25 MG tablet [id number] Yes TAKE 1 TABLET BY MOUTH  TWICE DAILY Rockey Situ, Kathlene November, MD Taking Active   metFORMIN (GLUCOPHAGE) 500 MG tablet [id number] Yes TAKE 2 TABLETS BY MOUTH  TWICE DAILY WITH MEALS Leone Haven, MD Taking Active   metoprolol succinate (TOPROL-XL) 25 MG 24 hr tablet [id number] Yes TAKE 1 TABLET BY MOUTH  DAILY Leone Haven, MD Taking Active   montelukast (SINGULAIR) 10 MG tablet [id number] Yes TAKE 1 TABLET BY MOUTH AT BEDTIME Leone Haven, MD Taking Active   Multiple Vitamin (MULTIVITAMIN) tablet [id number] Yes Take 1 tablet by mouth daily. [provider] Taking Active Self  nystatin cream (MYCOSTATIN) [id number] Yes Apply 1 application topically 2 (two) times daily. Leone Haven, MD Taking Active Self           Med Note Darnelle Maffucci, Waynette Buttery Jan 16, 2020 10:20 AM) PRN under breast  ondansetron (ZOFRAN ODT) 4 MG disintegrating tablet [id number] No Take 1 tablet (4 mg total) by mouth every 8 (eight) hours as needed for nausea or vomiting.  Patient not taking: Reported on 10/31/2020   Earleen Newport, MD Not Taking Active   OXYGEN [id number] Yes Inhale 2 L into the lungs daily. 2 liters at night, 1 liter when doing activities, and no oxygen when at home during the day. [provider] Taking Active Self  potassium chloride (KLOR-CON) 10 MEQ tablet [id number] Yes TAKE  1 TABLET BY MOUTH  TWICE DAILY Gollan, Kathlene November, MD Taking Active   PROVENTIL HFA 108 [phone] Base) MCG/ACT inhaler [id number] Yes Inhale 2 puffs into the lungs every 6 (six) hours as needed for wheezing or shortness of breath. 18 gram inhaler McLean-Scocuzza, Nino Glow, MD Taking Active   rosuvastatin (CRESTOR) 20 MG tablet [id number] Yes TAKE 1 TABLET BY MOUTH  DAILY Leone Haven, MD Taking Active   sodium chloride (OCEAN) 0.65 % SOLN nasal spray [id number] Yes Place 2 sprays into both nostrils as needed for congestion. McLean-Scocuzza, Nino Glow, MD Taking Active   vitamin C (ASCORBIC ACID) 500 MG tablet [id number] Yes Take 1,000 mg by mouth daily.  [provider] Taking Active   Vitamin E 180 MG CAPS [id number] Yes Take 1 capsule by mouth daily. [provider] Taking Active           Patient Active Problem List   Diagnosis Date  Noted  . COPD exacerbation ([place]) 10/23/2020  . COVID-19 09/06/2019  . 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 Seborrheic dermatitis 10/02/2018  . Light headedness 06/17/2018  . Foot pain 06/17/2018  . Allergic rhinitis 11/09/2017  . Venous insufficiency 08/07/2017  . Chronic respiratory failure ([place]) 11/13/2016  . Skin cyst 08/19/2016  . Thyroid nodule 11/28/2015  . COPD (chronic obstructive pulmonary disease) with emphysema ([place]) 11/13/2015  . Chronic diastolic heart failure ([place]) 11/13/2015  . Morbid obesity with BMI of 45.0-49.9, adult ([place]) 10/01/2015  . Obesity hypoventilation syndrome ([place]) 10/01/2015  . Eczema 10/01/2015  . Essential hypertension 10/01/2015  . Palpitations 06/26/2014  . DM type 2 (diabetes mellitus, type 2) ([place]) 06/26/2014  . Hyperlipidemia 06/26/2014  . Lung nodule seen on imaging study 02/06/2014    Conditions to be addressed/monitored: CHF, HTN, HLD, COPD and DMII  Care Plan : Medication Management  Updates made by De Hollingshead, RPH-CPP since 10/31/2020 12:00 AM    Problem: Diabetes, COPD, HTN, CHF      Goal: Disease Progression Prevention   Start Date: 10/31/2020  This Visit's Progress: On track  Priority: High  Note:   Current Barriers:  . Unable to independently afford treatment regimen . Complex patient with multiple comorbidities at high risk for exacerbation   Pharmacist Clinical Goal(s):  Marland Kitchen Over the next 90 days, patient will verbalize ability to afford treatment regimen . Over the next 90 days, patient will achieve adherence to monitoring guidelines and medication adherence to achieve therapeutic efficacy through collaboration with PharmD and provider.   Interventions: . 1:1 collaboration with Leone Haven, MD regarding development and update of comprehensive plan of care as evidenced by provider attestation and co-signature . Inter-disciplinary care team collaboration (see longitudinal plan of care) . Comprehensive medication review performed; medication list updated in electronic medical record  Diabetes: . Controlled; current treatment: metformin 1000 mg BID  . Current meal patterns: Breakfast: boiled eggs, 2 pieces toast, coffee; Lunch: boiled chicken breast in salad, tomatoes, onions, carrots, avocado; Supper: chicken, tomato, vegetable soup; avoids using salt w/ cooking; drinks: water, some pineapple juice, cranberrt juice, soda 1-2 times weekly; . Current exercise: none recently d/t concurrent sinus infection. Notes that she does plan to get back into walking daily . Recommended to continue current regimen at this time . Discussed eventual goal of 150 minutes of moderate intensity activity.  . Moving forward, could consider GLP1 for assistance with weight loss  Hypertension, CHF: . Controlled; current treatment: losartan 25 mg BID, HCTZ 12.5 mg daily, metoprolol succinate 25 mg daily, furosemide 20 mg PRN - though requiring daily; potassium 10 mEq BID; follows w/ Dr. Rockey Situ. F/u next week . Current home readings: not checking because last office visit was well  controlled . Educated on benefit of ambulatory BP readings to ensure control is maintained. Encouraged to periodically check home BP readings . Recommended to continue current regimen at this time along with cardiology collaboration  Hyperlipidemia and ASCVD risk reduction: . Controlled per last lipid panel; current treatment: rosuvastatin 20 mg daily;  . Antiplatelet regimen: aspirin 81 mg daily . Recommended to continue current regimen at this time  Chronic Obstructive Pulmonary Disease; Chronic Sinus Congestion often leading to COPD exacerbation: . Controlled COPD, but uncontrolled sinus symptoms; current treatment: Bevespi 9/4.8 mcg 2 puffs BID, albuterol HFA PRN, albuterol nebulizer PRN; chronic O2 use. Allergy regimen: cetirizine 10 mg daily, fluticasone intranasal both nostrils BID, montelukast 10 mg daily, plans to continue to use saline  nasal spray to help with congestion; follows w/ Dr. Edmund Hilda Unable to utilize Trelegy, Judithann Sauger- use of inhaled corticosteroid resulted in mouth irritation, even if she rinsed her mouth out well after use.  . Reports that she previously saw ENT and no structural abnormalities  . GOLD Classification: D . Recommended more frequent use of nasal saline to clear out any congestion. Consider change to alternative OTC antihistamine. Consider addition of nasal antihistamine w/ azelastine nasal spray.  Reduction in frequency of sinus infections would be beneficial to reduce need for steroids + antibiotics as frequently as she is currently needing  Supplements: Marland Kitchen Vitamin C, Vitamin D + Calcium, Vitamin E  Patient Goals/Self-Care Activities . Over the next 90 days, patient will:  - take medications as prescribed check glucose periodically, document, and provide at future appointments check blood pressure periodically, document, and provide at future appointments collaborate with provider on medication access solutions  Follow Up Plan: Telephone follow up  appointment with care management team member scheduled for: ~ 12 weeks      Medication Assistance: Bevespi obtained through [employer] medication assistance program.  Enrollment ends 09/28/21  Follow Up:  Patient agrees to Care Plan and Follow-up.  Plan: Telephone follow up appointment with care management team member scheduled for:  ~ 12 weeks  [name], PharmD, [place], [place] Clinical Pharmacist [employer] at [employer] [phone]

## 2020-10-31 NOTE — Patient Instructions (Signed)
Visit Information  PATIENT GOALS: Goals Addressed              This Visit's Progress     Patient Stated   .  Medication Monitoring (pt-stated)        Patient Goals/Self-Care Activities . Over the next 90 days, patient will:  - take medications as prescribed check glucose periodically, document, and provide at future appointments check blood pressure periodically, document, and provide at future appointments collaborate with provider on medication access solutions        Patient verbalizes understanding of instructions provided today and agrees to view in MyChart.    Plan: Telephone follow up appointment with care management team member scheduled for:  ~ 12 weeks  Catie Travis, PharmD, BCACP, CPP Clinical Pharmacist [facility] at [place] [phone]  

## 2020-11-03 NOTE — Progress Notes (Signed)
Date:  11/05/2020   ID:  Martha Woodard, Martha Woodard 1966-07-06, MRN [id number]  Patient Location:  [place] [address]   Provider location:   [facility], [place] office  PCP:  Leone Haven, MD  Cardiologist:  Arvid Right [facility]  Chief Complaint  Patient presents with  . Follow-up    Annual follow up. Medications verbally reviewed with patient.     History of Present Illness:    Martha Woodard is a 55 y.o. female  past medical history of diabetes,  asthma,  COPD, Quit smoking 2014, smoked for >30 yrs lung nodules  hospital for acute respiratory distress, 20 pound weight gain, hypoxia, diagnosed with COPD exacerbation and acute diastolic CHF  Chronic LE edema>L obesity hypoventilation Ejection fraction 60% in December 2016 She presents for follow-up of her diastolic CHF  Last seen by myself in clinic November 2019  Sinus infection SOB stable  Lasix daily, HCTZ daily Potassium x2  Labs reviewed HGBA1C 6.7, weight high Total chol 99  Continues to use oxygen when leaves house, 1 liter at night and when leaving the house  stopped smoking 06/2013  EKG personally reviewed by myself on todays visit NSR rate 78 bpm, no st or T wave changes  Other past medical hx COVID-19 + December 2020 Sore throat, coughing, some shortness of breath, had to use oxygen at times. Felt like bronchiits Sinus infection has persisted No fever currently PMD called prednisone, may need abx  Prior episode of hypoxic respiratory distress Previous CT chest  Bilateral lower lobe atelectasis, pulmonary edema versus interstitial pneumonitis superimposed on changes of COPD and right lung nodules   initiated on steroids, antibiotics, inhalation therapy as well as diuretics.    hypoxic intermittently, especially at nighttime and sitting in the bed, concerning for obesity hypoventilation syndrome.      Past Medical History:  Diagnosis Date  . CHF  (congestive heart failure) ([place])   . Colon polyp 01-01-2016  . COPD (chronic obstructive pulmonary disease) ([place])   . Diabetes mellitus without complication ([place])   . Diastolic heart failure ([place])   . HLD (hyperlipidemia)   . Hypertension   . Morbid obesity with BMI of 45.0-49.9, adult ([place])   . Multiple lung nodules on CT   . Shortness of breath dyspnea    Past Surgical History:  Procedure Laterality Date  . BIOPSY THYROID Right 02-06-14   NEGATIVE FOR MALIGNANT CELLS proteinaceous material and macrophages.  Marland Kitchen BREAST BIOPSY Left    neg- core  . COLONOSCOPY WITH PROPOFOL N/A 01/01/2016   Procedure: COLONOSCOPY WITH PROPOFOL;  Surgeon: Lucilla Lame, MD;  Location: [facility] ENDOSCOPY;  Service: Endoscopy;  Laterality: N/A;  . thryoid fna Right April 2015   Proteinaceous material and macrophages.  . WISDOM TOOTH EXTRACTION        Allergies:   Augmentin [amoxicillin-pot clavulanate]   Social History   Tobacco Use  . Smoking status: Former Smoker    Packs/day: 3.00    Years: 33.00    Pack years: 99.00    Quit date: 07/10/2013    Years since quitting: 7.3  . Smokeless tobacco: Never Used  Vaping Use  . Vaping Use: Former  Substance Use Topics  . Alcohol use: No    Alcohol/week: 0.0 standard drinks  . Drug use: No     Current Outpatient Medications on File Prior to Visit  Medication Sig Dispense Refill  . Accu-Chek Softclix Lancets lancets  TEST UP TO 4 TIMES DAILY 400 each 3  . albuterol (PROVENTIL) (2.5 MG/3ML) 0.083% nebulizer solution Take 3 mLs (2.5 mg total) by nebulization every 6 (six) hours as needed for wheezing or shortness of breath. 75 mL 12  . aspirin EC 81 MG tablet Take 1 tablet (81 mg total) by mouth daily. 90 tablet 3  . blood glucose meter kit and supplies Dispense based on patient and insurance preference. Use up to four times daily as directed. (FOR ICD-9 250.00, 250.01). 1 each 0  . calcium-vitamin D (OSCAL WITH D) 500-200 MG-UNIT tablet Take 1 tablet by mouth 2  (two) times daily.    . cetirizine (ZYRTEC) 10 MG tablet Take 10 mg by mouth daily.    . fluticasone (FLONASE) 50 MCG/ACT nasal spray USE 1 OR 2 SPRAYS IN EACH NOSTRIL ONCE DAILY. 16 g 12  . furosemide (LASIX) 20 MG tablet TAKE 1 TABLET BY MOUTH  DAILY AS NEEDED 90 tablet 0  . glucose blood (ACCU-CHEK AVIVA PLUS) test strip TEST UP TO 4 TIMES DAILY AS DIRECTED 100 strip 13  . Glycopyrrolate-Formoterol (BEVESPI AEROSPHERE) 9-4.8 MCG/ACT AERO Inhale 2 puffs into the lungs 2 (two) times daily. 10.7 g 11  . hydrochlorothiazide (MICROZIDE) 12.5 MG capsule TAKE 1 CAPSULE BY MOUTH  DAILY 90 capsule 3  . ketoconazole (NIZORAL) 2 % cream Apply 1 application topically daily. 15 g 0  . ketoconazole (NIZORAL) 2 % shampoo Apply 1 application topically 2 (two) times a week. 120 mL 0  . losartan (COZAAR) 25 MG tablet TAKE 1 TABLET BY MOUTH  TWICE DAILY 180 tablet 0  . metFORMIN (GLUCOPHAGE) 500 MG tablet TAKE 2 TABLETS BY MOUTH  TWICE DAILY WITH MEALS 360 tablet 3  . metoprolol succinate (TOPROL-XL) 25 MG 24 hr tablet TAKE 1 TABLET BY MOUTH  DAILY 90 tablet 3  . montelukast (SINGULAIR) 10 MG tablet TAKE 1 TABLET BY MOUTH AT BEDTIME 30 tablet 3  . Multiple Vitamin (MULTIVITAMIN) tablet Take 1 tablet by mouth daily.    Marland Kitchen nystatin cream (MYCOSTATIN) Apply 1 application topically 2 (two) times daily. 30 g 0  . ondansetron (ZOFRAN ODT) 4 MG disintegrating tablet Take 1 tablet (4 mg total) by mouth every 8 (eight) hours as needed for nausea or vomiting. 20 tablet 0  . OXYGEN Inhale 2 L into the lungs daily. 2 liters at night, 1 liter when doing activities, and no oxygen when at home during the day.    . potassium chloride (KLOR-CON) 10 MEQ tablet TAKE 1 TABLET BY MOUTH  TWICE DAILY 180 tablet 0  . PROVENTIL HFA 108 (90 Base) MCG/ACT inhaler Inhale 2 puffs into the lungs every 6 (six) hours as needed for wheezing or shortness of breath. 18 gram inhaler 3 each 1  . rosuvastatin (CRESTOR) 20 MG tablet TAKE 1 TABLET BY  MOUTH  DAILY 90 tablet 3  . sodium chloride (OCEAN) 0.65 % SOLN nasal spray Place 2 sprays into both nostrils as needed for congestion. 30 mL 11  . vitamin C (ASCORBIC ACID) 500 MG tablet Take 1,000 mg by mouth daily.     . Vitamin E 180 MG CAPS Take 1 capsule by mouth daily.     No current facility-administered medications on file prior to visit.     Family Hx: The patient's family history includes Breast cancer (age of onset: 41) in her sister; Breast cancer (age of onset: 55) in her sister; Cancer (age of onset: 74) in her mother; Goiter in  her mother; Heart murmur in her sister; Stroke in her brother and father.  ROS:   Please see the history of present illness.    Review of Systems  Constitutional: Negative.   HENT: Negative.   Respiratory: Positive for shortness of breath.   Cardiovascular: Negative.   Gastrointestinal: Negative.   Musculoskeletal: Negative.   Neurological: Negative.   Psychiatric/Behavioral: Negative.   All other systems reviewed and are negative.    Labs/Other Tests and Data Reviewed:    Recent Labs: 08/22/2020: BUN 12; Creatinine, Ser 0.59; Hemoglobin 14.2; Platelets 261.0; Potassium 4.5; Sodium 141; TSH 0.87 09/20/2020: ALT 35   Recent Lipid Panel Lab Results  Component Value Date/Time   CHOL 99 08/22/2020 10:19 AM   TRIG 131.0 08/22/2020 10:19 AM   HDL 39.70 08/22/2020 10:19 AM   CHOLHDL 2 08/22/2020 10:19 AM   LDLCALC 33 08/22/2020 10:19 AM   LDLDIRECT 57.0 07/27/2018 09:20 AM    Wt Readings from Last 3 Encounters:  11/05/20 250 lb (113.4 kg)  10/23/20 250 lb (113.4 kg)  08/22/20 252 lb 6.4 oz (114.5 kg)     Exam:    Vital Signs: Vital signs may also be detailed in the HPI BP (!) 144/80 (BP Location: Left Arm, Patient Position: Sitting, Cuff Size: Normal)   Pulse 78   Ht 5' 6"  (1.676 m)   Wt 250 lb (113.4 kg)   LMP 04/10/2014   SpO2 95% Comment: 1 Liter of Oxygen  BMI 40.35 kg/m    Constitutional:  oriented to person, place, and  time. No distress.  HENT:  Head: Grossly normal Eyes:  no discharge. No scleral icterus.  Neck: No JVD, no carotid bruits  Cardiovascular: Regular rate and rhythm, no murmurs appreciated Pulmonary/Chest: Clear to auscultation bilaterally, no wheezes or rails Abdominal: Soft.  no distension.  no tenderness.  Musculoskeletal: Normal range of motion Neurological:  normal muscle tone. Coordination normal. No atrophy Skin: Skin warm and dry Psychiatric: normal affect, pleasant   ASSESSMENT & PLAN:    Problem List Items Addressed This Visit      Cardiology Problems   Essential hypertension   Chronic diastolic heart failure (HCC) - Primary   Hyperlipidemia     Other   DM type 2 (diabetes mellitus, type 2) (HCC)   COPD (chronic obstructive pulmonary disease) with emphysema (HCC)   Obesity hypoventilation syndrome (HCC)     Covid:  recovered, other family members with Covid as well Frequent sinus infection, again today  COPD: Stopped smoking On oxygen, 1 liter nighttime and when she goes out, stable symptoms  Obesity hypoventilation: We have encouraged continued exercise, careful diet management in an effort to lose weight.  Diastolic CHF On lasix daily, potassium x 2 HCTZ, euvolemic today Stable BMP  Diabetes:  HBA1C 6.7 We have encouraged continued exercise, careful diet management in an effort to lose weight.    Total encounter time more than 25 minutes  Greater than 50% was spent in counseling and coordination of care with the patient   Signed, Ida Rogue, [place] [address], [place], [place] [address]

## 2020-11-05 ENCOUNTER — Encounter: Payer: Self-pay | Admitting: Cardiovascular Disease

## 2020-11-05 ENCOUNTER — Other Ambulatory Visit: Payer: Self-pay

## 2020-11-05 ENCOUNTER — Ambulatory Visit: Payer: Medicare Other | Admitting: Cardiovascular Disease

## 2020-11-05 VITALS — BP 144/80 | HR 78 | Ht 66.0 in | Wt 250.0 lb

## 2020-11-05 DIAGNOSIS — E662 Morbid (severe) obesity with alveolar hypoventilation: Secondary | ICD-10-CM | POA: Diagnosis not present

## 2020-11-05 DIAGNOSIS — I10 Essential (primary) hypertension: Secondary | ICD-10-CM | POA: Diagnosis not present

## 2020-11-05 DIAGNOSIS — I491 Atrial premature depolarization: Secondary | ICD-10-CM | POA: Diagnosis not present

## 2020-11-05 DIAGNOSIS — E782 Mixed hyperlipidemia: Secondary | ICD-10-CM

## 2020-11-05 DIAGNOSIS — J432 Centrilobular emphysema: Secondary | ICD-10-CM

## 2020-11-05 DIAGNOSIS — E1159 Type 2 diabetes mellitus with other circulatory complications: Secondary | ICD-10-CM | POA: Diagnosis not present

## 2020-11-05 DIAGNOSIS — I5032 Chronic diastolic (congestive) heart failure: Secondary | ICD-10-CM

## 2020-11-05 MED ORDER — FUROSEMIDE 20 MG PO TABS: 20.0000 mg | ORAL_TABLET | Freq: Every day | ORAL | 3 refills | Status: DC | PRN

## 2020-11-05 MED ORDER — LOSARTAN POTASSIUM 25 MG PO TABS: 25.0000 mg | ORAL_TABLET | Freq: Two times a day (BID) | ORAL | 3 refills | Status: DC

## 2020-11-05 MED ORDER — POTASSIUM CHLORIDE CRYS ER 10 MEQ PO TBCR: 10.0000 meq | EXTENDED_RELEASE_TABLET | Freq: Two times a day (BID) | ORAL | 3 refills | Status: DC

## 2020-11-05 MED ORDER — HYDROCHLOROTHIAZIDE 12.5 MG PO CAPS: 12.5000 mg | ORAL_CAPSULE | Freq: Every day | ORAL | 3 refills | Status: DC

## 2020-11-05 MED ORDER — ROSUVASTATIN CALCIUM 20 MG PO TABS: 20.0000 mg | ORAL_TABLET | Freq: Every day | ORAL | 3 refills | Status: DC

## 2020-11-05 MED ORDER — METOPROLOL SUCCINATE ER 25 MG PO TB24: 25.0000 mg | ORAL_TABLET | Freq: Every day | ORAL | 3 refills | Status: DC

## 2020-11-05 NOTE — Patient Instructions (Signed)
Medication Instructions:  No changes  If you need a refill on your cardiac medications before your next appointment, please call your pharmacy.    Lab work: No new labs needed   If you have labs (blood work) drawn today and your tests are completely normal, you will receive your results only by: . MyChart Message (if you have MyChart) OR . A paper copy in the mail If you have any lab test that is abnormal or we need to change your treatment, we will call you to review the results.   Testing/Procedures: No new testing needed   Follow-Up: At [facility], you and your health needs are our priority.  As part of our continuing mission to provide you with exceptional heart care, we have created designated Provider Care Teams.  These Care Teams include your primary Cardiologist (physician) and Advanced Practice Providers (APPs -  Physician Assistants and Nurse Practitioners) who all work together to provide you with the care you need, when you need it.  . You will need a follow up appointment in 12 months  . Providers on your designated Care Team:   . Christopher Berge, NP . Ryan Dunn, PA-C . Jacquelyn Visser, PA-C  Any Other Special Instructions Will Be Listed Below (If Applicable).  COVID-19 Vaccine Information can be found at: [email or web address] For questions related to vaccine distribution or appointments, please email [email or web address] or call [phone].     

## 2020-11-07 ENCOUNTER — Other Ambulatory Visit: Payer: Self-pay | Admitting: Internal Medicine

## 2020-11-07 ENCOUNTER — Telehealth: Payer: Self-pay | Admitting: Family Medicine

## 2020-11-07 ENCOUNTER — Telehealth: Payer: Self-pay

## 2020-11-07 DIAGNOSIS — B370 Candidal stomatitis: Secondary | ICD-10-CM

## 2020-11-07 MED ORDER — NYSTATIN 100000 UNIT/ML MT SUSP: 5.0000 mL | Freq: Four times a day (QID) | OROMUCOSAL | 0 refills | Status: DC

## 2020-11-07 NOTE — Telephone Encounter (Signed)
Can you please call and triage this patient for me Dr. Caryl Bis will not write any prescriptions without seeing the patient just a FYI.  here is her message:    Patient called in about having a sinus infection Dr.Mclean treated her  In 10-23-20 and she stated that she had not gotten over the sinus infection and she also has thresh  in her mouth now and need some thing for this.

## 2020-11-07 NOTE — Telephone Encounter (Signed)
Patient took covid booster on 11/05/20 and she started having more sinus congestion and cough.  Now she says she has white coating in her mouth from ABX that were prescribed on 10/23/20 wants to know if Nystatin Mouth was ha can be called in.

## 2020-11-07 NOTE — Telephone Encounter (Signed)
Patient called in about having a sinus infection Dr.Mclean treated her  In 10-23-20 and she stated that she had not gotten over the sinus infection and she also has thresh  in her mouth now and need some thing for this

## 2020-11-07 NOTE — Telephone Encounter (Signed)
Ok to send in nystatin  Sinus issues likely needs to see ENT in the future and f/u pulmonary as well

## 2020-11-07 NOTE — Telephone Encounter (Signed)
Medication was sent in.  Nina,cma

## 2020-11-07 NOTE — Telephone Encounter (Signed)
Per fax received from [facility]: Patient has a history of generic Klor-Con M !20mEq (micor crystals). New prescription sent indicates a change to generic Klor-Con mEq tabs (wax matrix). Please verify if dosage form is intended to be filled as prescribed or continue per history, generic Klon-Con M 10mEq tabs. Thank you!

## 2020-11-23 ENCOUNTER — Ambulatory Visit (INDEPENDENT_AMBULATORY_CARE_PROVIDER_SITE_OTHER): Payer: Medicare Other | Admitting: Family Medicine

## 2020-11-23 ENCOUNTER — Other Ambulatory Visit: Payer: Self-pay

## 2020-11-23 ENCOUNTER — Encounter: Payer: Self-pay | Admitting: Family Medicine

## 2020-11-23 VITALS — BP 140/70 | HR 92 | Temp 99.1°F | Ht 66.0 in | Wt 252.6 lb

## 2020-11-23 DIAGNOSIS — E1159 Type 2 diabetes mellitus with other circulatory complications: Secondary | ICD-10-CM | POA: Diagnosis not present

## 2020-11-23 DIAGNOSIS — Z23 Encounter for immunization: Secondary | ICD-10-CM

## 2020-11-23 DIAGNOSIS — M654 Radial styloid tenosynovitis [de Quervain]: Secondary | ICD-10-CM

## 2020-11-23 DIAGNOSIS — J439 Emphysema, unspecified: Secondary | ICD-10-CM | POA: Diagnosis not present

## 2020-11-23 DIAGNOSIS — E782 Mixed hyperlipidemia: Secondary | ICD-10-CM | POA: Diagnosis not present

## 2020-11-23 DIAGNOSIS — I10 Essential (primary) hypertension: Secondary | ICD-10-CM

## 2020-11-23 DIAGNOSIS — I872 Venous insufficiency (chronic) (peripheral): Secondary | ICD-10-CM

## 2020-11-23 LAB — POCT GLYCOSYLATED HEMOGLOBIN (HGB A1C): Hemoglobin A1C: 6.7 % — AB (ref 4.0–5.6)

## 2020-11-23 MED ORDER — PREVNAR 20 0.5 ML IM SUSY: 0.5000 mL | PREFILLED_SYRINGE | Freq: Once | INTRAMUSCULAR | 0 refills | Status: DC

## 2020-11-23 NOTE — Assessment & Plan Note (Signed)
Chronic issues with this.  Relatively stable.  She has had some blood streaks in her mucus with after she coughs significantly.  Will check with her pulmonologist to get their input to see if she warrants a scan or follow-up with them on this.  She will continue her Bevespi.

## 2020-11-23 NOTE — Assessment & Plan Note (Signed)
Symptoms and exam are most consistent with this.  Discussed icing with an ice cube in this area for 10 minutes 2-3 times a day.  We will refer to orthopedics given significance of discomfort.

## 2020-11-23 NOTE — Patient Instructions (Signed)
Nice to see you. We will let you know what Dr. Patsey Berthold says regarding your blood in your mucus. We will get you referred to orthopedics. Please ice your wrist as we discussed.

## 2020-11-23 NOTE — Progress Notes (Signed)
Martha Rumps, MD Phone: [phone]  Martha Woodard is a 55 y.o. female who presents today for f/u.  HYPERTENSION  Disease Monitoring  Home BP Monitoring mostly < 976 systolic Chest pain- no    Dyspnea- no change to chronic dyspnea from COPD Medications  Compliance-  taking losartan, metoprolol.   Edema- intermittent in her bilateral legs L>R  DIABETES Disease Monitoring: Blood Sugar ranges-117-145 Polyuria/phagia/dipsia- no      Optho- due Medications: Compliance- taking metformin Hypoglycemic symptoms- no  HYPERLIPIDEMIA Symptoms Chest pain on exertion:  no   Medications: Compliance- taking crestor Right upper quadrant pain- no  Muscle aches- chronic  Left wrist pain: This has been going on in the radial aspect of her left wrist for the last 2 weeks.  No injury.  Hurts with movement and notes she cannot extend her thumb fully.  No medications or icing.  She tried wearing a brace with little benefit.  COPD: Patient notes chronic ongoing issues with dyspnea and chronic cough.  Notes these are relatively stable though sometimes she coughs more than typical and she will have some small streaks of blood in her mucus.  That only occurs once or twice a week and has been going on for a while.  She reports she has discussed this with her pulmonologist previously.  She does continue to use her bevespi.  She does follow with pulmonology.   Social History   Tobacco Use  Smoking Status Former Smoker  . Packs/day: 3.00  . Years: 33.00  . Pack years: 99.00  . Quit date: 07/10/2013  . Years since quitting: 7.3  Smokeless Tobacco Never Used    Current Outpatient Medications on File Prior to Visit  Medication Sig Dispense Refill  . Accu-Chek Softclix Lancets lancets TEST UP TO 4 TIMES DAILY 400 each 3  . albuterol (PROVENTIL) (2.5 MG/3ML) 0.083% nebulizer solution Take 3 mLs (2.5 mg total) by nebulization every 6 (six) hours as needed for wheezing or shortness of breath. 75 mL 12  .  aspirin EC 81 MG tablet Take 1 tablet (81 mg total) by mouth daily. 90 tablet 3  . blood glucose meter kit and supplies Dispense based on patient and insurance preference. Use up to four times daily as directed. (FOR ICD-9 250.00, 250.01). 1 each 0  . calcium-vitamin D (OSCAL WITH D) 500-200 MG-UNIT tablet Take 1 tablet by mouth 2 (two) times daily.    . cetirizine (ZYRTEC) 10 MG tablet Take 10 mg by mouth daily.    . fluticasone (FLONASE) 50 MCG/ACT nasal spray USE 1 OR 2 SPRAYS IN EACH NOSTRIL ONCE DAILY. 16 g 12  . furosemide (LASIX) 20 MG tablet Take 1 tablet (20 mg total) by mouth daily as needed. 90 tablet 3  . glucose blood (ACCU-CHEK AVIVA PLUS) test strip TEST UP TO 4 TIMES DAILY AS DIRECTED 100 strip 13  . Glycopyrrolate-Formoterol (BEVESPI AEROSPHERE) 9-4.8 MCG/ACT AERO Inhale 2 puffs into the lungs 2 (two) times daily. 10.7 g 11  . hydrochlorothiazide (MICROZIDE) 12.5 MG capsule Take 1 capsule (12.5 mg total) by mouth daily. 90 capsule 3  . ketoconazole (NIZORAL) 2 % cream Apply 1 application topically daily. 15 g 0  . ketoconazole (NIZORAL) 2 % shampoo Apply 1 application topically 2 (two) times a week. 120 mL 0  . losartan (COZAAR) 25 MG tablet Take 1 tablet (25 mg total) by mouth 2 (two) times daily. 180 tablet 3  . metFORMIN (GLUCOPHAGE) 500 MG tablet TAKE 2 TABLETS BY MOUTH  TWICE DAILY WITH MEALS 360 tablet 3  . metoprolol succinate (TOPROL-XL) 25 MG 24 hr tablet Take 1 tablet (25 mg total) by mouth daily. 90 tablet 3  . montelukast (SINGULAIR) 10 MG tablet TAKE 1 TABLET BY MOUTH AT BEDTIME 30 tablet 3  . Multiple Vitamin (MULTIVITAMIN) tablet Take 1 tablet by mouth daily.    Marland Kitchen nystatin (MYCOSTATIN) 100000 UNIT/ML suspension Take 5 mLs (500,000 Units total) by mouth 4 (four) times daily. X 7-10 days 473 mL 0  . nystatin cream (MYCOSTATIN) Apply 1 application topically 2 (two) times daily. 30 g 0  . ondansetron (ZOFRAN ODT) 4 MG disintegrating tablet Take 1 tablet (4 mg total) by  mouth every 8 (eight) hours as needed for nausea or vomiting. 20 tablet 0  . OXYGEN Inhale 2 L into the lungs daily. 2 liters at night, 1 liter when doing activities, and no oxygen when at home during the day.    . potassium chloride (KLOR-CON) 10 MEQ tablet Take 1 tablet (10 mEq total) by mouth 2 (two) times daily. 180 tablet 3  . PROVENTIL HFA 108 (90 Base) MCG/ACT inhaler Inhale 2 puffs into the lungs every 6 (six) hours as needed for wheezing or shortness of breath. 18 gram inhaler 3 each 1  . rosuvastatin (CRESTOR) 20 MG tablet Take 1 tablet (20 mg total) by mouth daily. 90 tablet 3  . sodium chloride (OCEAN) 0.65 % SOLN nasal spray Place 2 sprays into both nostrils as needed for congestion. 30 mL 11  . vitamin C (ASCORBIC ACID) 500 MG tablet Take 1,000 mg by mouth daily.     . Vitamin E 180 MG CAPS Take 1 capsule by mouth daily.     No current facility-administered medications on file prior to visit.     ROS see history of present illness  Objective  Physical Exam Vitals:   11/23/20 0955  BP: 140/70  Pulse: 92  Temp: 99.1 F (37.3 C)  SpO2: 93%    BP Readings from Last 3 Encounters:  11/23/20 140/70  11/05/20 (!) 144/80  08/22/20 120/80   Wt Readings from Last 3 Encounters:  11/23/20 252 lb 9.6 oz (114.6 kg)  11/05/20 250 lb (113.4 kg)  10/23/20 250 lb (113.4 kg)    Physical Exam Constitutional:      General: She is not in acute distress.    Appearance: She is not diaphoretic.  Cardiovascular:     Rate and Rhythm: Normal rate and regular rhythm.     Heart sounds: Normal heart sounds.  Pulmonary:     Effort: Pulmonary effort is normal.     Breath sounds: Normal breath sounds.  Musculoskeletal:        General: No edema.     Comments: Left wrist with tenderness over the proximal radial aspect, no anatomic snuffbox tenderness, slight swelling in this area, positive Finkelstein's on the left  Skin:    General: Skin is warm and dry.  Neurological:     Mental  Status: She is alert.      Assessment/Plan: Please see individual problem list.  Problem List Items Addressed This Visit    COPD (chronic obstructive pulmonary disease) with emphysema ([place])    Chronic issues with this.  Relatively stable.  She has had some blood streaks in her mucus with after she coughs significantly.  Will check with her pulmonologist to get their input to see if she warrants a scan or follow-up with them on this.  She will continue her  Bevespi.      De Quervain's tenosynovitis, left    Symptoms and exam are most consistent with this.  Discussed icing with an ice cube in this area for 10 minutes 2-3 times a day.  We will refer to orthopedics given significance of discomfort.      Relevant Orders   Ambulatory referral to Orthopedic Surgery   DM type 2 (diabetes mellitus, type 2) (HCC)    Check A1c.  Continue Metformin 1000 mg twice daily.      Relevant Orders   POCT HgB A1C (Completed)   Essential hypertension    Generally well controlled.  She will continue losartan 25 mg once daily and metoprolol 25 mg daily.      Hyperlipidemia    Continue Crestor 20 mg once daily.      Venous insufficiency    Discussed propping her legs up as much as possible.           This visit occurred during the SARS-CoV-2 public health emergency.  Safety protocols were in place, including screening questions prior to the visit, additional usage of staff PPE, and extensive cleaning of exam room while observing appropriate contact time as indicated for disinfecting solutions.    Martha Rumps, MD [place]

## 2020-11-23 NOTE — Assessment & Plan Note (Signed)
Continue Crestor 20 mg once daily. ?

## 2020-11-23 NOTE — Addendum Note (Signed)
Addended by: Fulton Mole D on: 11/23/2020 10:47 AM   Modules accepted: Orders

## 2020-11-23 NOTE — Assessment & Plan Note (Signed)
Discussed propping her legs up as much as possible.

## 2020-11-23 NOTE — Assessment & Plan Note (Signed)
Check A1c.  Continue Metformin 1000 mg twice daily. 

## 2020-11-23 NOTE — Assessment & Plan Note (Signed)
Generally well controlled.  She will continue losartan 25 mg once daily and metoprolol 25 mg daily.

## 2020-11-26 NOTE — Progress Notes (Signed)
Agree with the details of the procedure as noted by Tammy Parrett, NP.  C. Laura Abbiegail Landgren, MD [facility] 

## 2020-11-28 ENCOUNTER — Telehealth: Payer: Self-pay | Admitting: Family Medicine

## 2020-11-28 DIAGNOSIS — J449 Chronic obstructive pulmonary disease, unspecified: Secondary | ICD-10-CM | POA: Diagnosis not present

## 2020-11-28 NOTE — Telephone Encounter (Signed)
Error

## 2020-12-13 ENCOUNTER — Encounter: Payer: Self-pay | Admitting: Pulmonary Disease

## 2020-12-13 ENCOUNTER — Ambulatory Visit: Payer: Medicare Other | Admitting: Pulmonary Disease

## 2020-12-13 ENCOUNTER — Other Ambulatory Visit: Payer: Self-pay

## 2020-12-13 VITALS — BP 136/70 | HR 86 | Temp 97.1°F | Ht 66.0 in | Wt 250.8 lb

## 2020-12-13 DIAGNOSIS — Z87891 Personal history of nicotine dependence: Secondary | ICD-10-CM

## 2020-12-13 DIAGNOSIS — J9612 Chronic respiratory failure with hypercapnia: Secondary | ICD-10-CM | POA: Diagnosis not present

## 2020-12-13 DIAGNOSIS — E662 Morbid (severe) obesity with alveolar hypoventilation: Secondary | ICD-10-CM

## 2020-12-13 DIAGNOSIS — R042 Hemoptysis: Secondary | ICD-10-CM

## 2020-12-13 DIAGNOSIS — J9611 Chronic respiratory failure with hypoxia: Secondary | ICD-10-CM | POA: Diagnosis not present

## 2020-12-13 DIAGNOSIS — J449 Chronic obstructive pulmonary disease, unspecified: Secondary | ICD-10-CM

## 2020-12-13 NOTE — Progress Notes (Signed)
Subjective:    Patient ID: Martha Woodard, female    DOB: [birth year], 55 y.o.   MRN: [id number]   Chief Complaint  Patient presents with  . Follow-up    C/o prod cough with yellow sputum, sob with exertion and mild wheezing.    PROBLEMS: Chronic hypoxic/hypercarbic respiratory failure Former smoker Moderate/severe COPD [facility] CHF  Multiple pulmonary nodules - stable > 2 yrs and deemed benign  DATA: 09/28/2015 2D echo: EF 60 to 65%, grade 1 DD, mild left atrial dilation.  Right-sided pressures normal. 02/28/2020 PFTs:FEV1 1.42L or 49%, FVC or 2.24L 60%, FEV1/FVC 63%, no bronchodilator response.  RV is 122% indicating air trapping.  Diffusion capacity normal.  Consistent with moderate to severe COPD. 04/05/2020 chest x-ray: No active disease.  Consistent with COPD, left base scarring/atelectasis.  INTERVAL HISTORY: Last seen by  07/31/2020. No majorpulmonaryevents since then.  HPI 55 year old former smoker (2014) who presents for follow-up on COPD.  This is a scheduled visit.  She has been doing well on Bevespi, 2 puffs twice a day.  She cannot tolerate inhaled corticosteroids due to thrush.  She does not endorse any new symptomatology.  She has cough productive of yellow sputum usually in the mornings but then it subsides.  She continues to have issues with dyspnea on exertion this is at baseline.  On oxygen at 1 L/min she is compliant with the oxygen.  She does not endorse any new complaint.  Review of Systems A 10 point review of systems was performed and it is as noted above otherwise negative.  Patient Active Problem List   Diagnosis Date Noted  . De Quervain's tenosynovitis, left 11/23/2020  . COPD exacerbation ([place]) 10/23/2020  . COVID-19 09/06/2019  . 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 Seborrheic dermatitis 10/02/2018  . Light headedness 06/17/2018  . Foot pain 06/17/2018  . Allergic rhinitis 11/09/2017  . Venous insufficiency 08/07/2017  . Chronic  respiratory failure ([place]) 11/13/2016  . Skin cyst 08/19/2016  . Thyroid nodule 11/28/2015  . COPD (chronic obstructive pulmonary disease) with emphysema ([place]) 11/13/2015  . Chronic diastolic heart failure ([place]) 11/13/2015  . Morbid obesity with BMI of 45.0-49.9, adult ([place]) 10/01/2015  . Obesity hypoventilation syndrome ([place]) 10/01/2015  . Eczema 10/01/2015  . Essential hypertension 10/01/2015  . Palpitations 06/26/2014  . DM type 2 (diabetes mellitus, type 2) ([place]) 06/26/2014  . Hyperlipidemia 06/26/2014  . Lung nodule seen on imaging study 02/06/2014   Social History   Tobacco Use  . Smoking status: Former Smoker    Packs/day: 3.00    Years: 33.00    Pack years: 99.00    Quit date: 07/10/2013    Years since quitting: 7.5  . Smokeless tobacco: Never Used  Substance Use Topics  . Alcohol use: No    Alcohol/week: 0.0 standard drinks     Allergies  Allergen Reactions  . Augmentin [Amoxicillin-Pot Clavulanate] Itching    Hives Can take PCN and Amoxicillin alone     Current Meds  Medication Sig  . Accu-Chek Softclix Lancets lancets TEST UP TO 4 TIMES DAILY  . albuterol (PROVENTIL) (2.5 MG/3ML) 0.083% nebulizer solution Take 3 mLs (2.5 mg total) by nebulization every 6 (six) hours as needed for wheezing or shortness of breath.  Marland Kitchen aspirin EC 81 MG tablet Take 1 tablet (81 mg total) by mouth daily.  . blood glucose meter kit and supplies Dispense based on patient and insurance preference. Use up to four times daily as directed. (FOR ICD-9  250.00, 250.01).  . calcium-vitamin D (OSCAL WITH D) 500-200 MG-UNIT tablet Take 1 tablet by mouth 2 (two) times daily.  . cetirizine (ZYRTEC) 10 MG tablet Take 10 mg by mouth daily.  . fluticasone (FLONASE) 50 MCG/ACT nasal spray USE 1 OR 2 SPRAYS IN EACH NOSTRIL ONCE DAILY.  . furosemide (LASIX) 20 MG tablet Take 1 tablet (20 mg total) by mouth daily as needed.  Marland Kitchen glucose blood (ACCU-CHEK AVIVA PLUS) test strip TEST UP TO 4 TIMES DAILY AS  DIRECTED  . Glycopyrrolate-Formoterol (BEVESPI AEROSPHERE) 9-4.8 MCG/ACT AERO Inhale 2 puffs into the lungs 2 (two) times daily.  . hydrochlorothiazide (MICROZIDE) 12.5 MG capsule Take 1 capsule (12.5 mg total) by mouth daily.  Marland Kitchen ketoconazole (NIZORAL) 2 % cream Apply 1 application topically daily.  Marland Kitchen ketoconazole (NIZORAL) 2 % shampoo Apply 1 application topically 2 (two) times a week.  . losartan (COZAAR) 25 MG tablet Take 1 tablet (25 mg total) by mouth 2 (two) times daily.  . metFORMIN (GLUCOPHAGE) 500 MG tablet TAKE 2 TABLETS BY MOUTH  TWICE DAILY WITH MEALS  . metoprolol succinate (TOPROL-XL) 25 MG 24 hr tablet Take 1 tablet (25 mg total) by mouth daily.  . montelukast (SINGULAIR) 10 MG tablet TAKE 1 TABLET BY MOUTH AT BEDTIME  . Multiple Vitamin (MULTIVITAMIN) tablet Take 1 tablet by mouth daily.  Marland Kitchen nystatin (MYCOSTATIN) 100000 UNIT/ML suspension Take 5 mLs (500,000 Units total) by mouth 4 (four) times daily. X 7-10 days  . nystatin cream (MYCOSTATIN) Apply 1 application topically 2 (two) times daily.  . ondansetron (ZOFRAN ODT) 4 MG disintegrating tablet Take 1 tablet (4 mg total) by mouth every 8 (eight) hours as needed for nausea or vomiting.  . OXYGEN Inhale 2 L into the lungs daily. 2 liters at night, 1 liter when doing activities, and no oxygen when at home during the day.  . potassium chloride (KLOR-CON) 10 MEQ tablet Take 1 tablet (10 mEq total) by mouth 2 (two) times daily.  Marland Kitchen PROVENTIL HFA 108 (90 Base) MCG/ACT inhaler Inhale 2 puffs into the lungs every 6 (six) hours as needed for wheezing or shortness of breath. 18 gram inhaler  . rosuvastatin (CRESTOR) 20 MG tablet Take 1 tablet (20 mg total) by mouth daily.  . sodium chloride (OCEAN) 0.65 % SOLN nasal spray Place 2 sprays into both nostrils as needed for congestion.  . vitamin C (ASCORBIC ACID) 500 MG tablet Take 1,000 mg by mouth daily.   . Vitamin E 180 MG CAPS Take 1 capsule by mouth daily.   Immunization History   Administered Date(s) Administered  . Influenza,inj,Quad PF,6+ Mos 07/02/2015, 08/19/2016, 08/07/2017, 06/16/2018, 06/29/2019, 07/31/2020  . Moderna SARS-COV2 Booster Vaccination 11/05/2020  . Moderna Sars-Covid-2 Vaccination 03/08/2020, 04/05/2020  . Pneumococcal Polysaccharide-23 09/26/2015  . Pneumococcal-Unspecified 11/23/2020       Objective:   Physical Exam BP 136/70 (BP Location: Left Arm, Cuff Size: Normal)   Pulse 86   Temp (!) 97.1 F (36.2 C) (Temporal)   Ht 5' 6"  (1.676 m)   Wt 250 lb 12.8 oz (113.8 kg)   LMP 04/10/2014   SpO2 96%   BMI 40.48 kg/m  GENERAL: Obese woman, no acute distress, comfortable with nasal cannula O2.  Fully ambulatory. HEAD: Normocephalic, atraumatic.  EYES: Pupils equal, round, reactive to light.  No scleral icterus.  MOUTH: Nose/mouth/throat not examined due to masking requirements for COVID 19. NECK: Supple. No thyromegaly. Trachea midline. No JVD.  No adenopathy. PULMONARY: Mildly diminished breath sounds.  No adventitious sounds. CARDIOVASCULAR: S1 and S2. Regular rate and rhythm.  No rubs, murmurs or gallops heard. ABDOMEN: Obese, otherwise benign. MUSCULOSKELETAL: No joint deformity, no clubbing, no edema.  NEUROLOGIC: No focal deficit, no gait disturbance, speech is fluent. SKIN: Intact,warm,dry. PSYCH: Mood and behavior normal     Assessment & Plan:     ICD-10-CM   1. COPD with chronic bronchitis and emphysema ([place])  J44.9    Continue Bevespi 2 puffs twice a day Continue abstinence from cigarettes Follow-up in 3 months time    2. Chronic respiratory failure with hypoxia and hypercapnia ([facility])  J96.11    J96.12    Continue oxygen at 1 L/min She is compliant with oxygen Notes improvement with the therapy  3. Cough with hemoptysis  R04.2    Cough is not baseline No further episodes of streaky hemoptysis No further issues with epistaxis  4. Obesity hypoventilation syndrome ([place])  E66.2    This issue adds complexity to her  management Weight loss is recommended  5. Former very heavy cigarette smoker (more than 40 per day)  Z87.891    She has not relapsed     Smoking cessation instruction/counseling given:  commended patient for quitting and reviewed strategies for preventing relapses   Will follow-up in 3 months time.  She is to contact us prior to that time should any new difficulties arise.  Renold Don, MD [facility]   *This note was dictated using voice recognition software/Dragon.  Despite best efforts to proofread, errors can occur which can change the meaning.  Any change was purely unintentional.

## 2020-12-13 NOTE — Patient Instructions (Signed)
Continue medications as they are   We will see you in follow-up in 3 months time, call sooner should any new problems arise

## 2020-12-18 ENCOUNTER — Telehealth: Payer: Self-pay | Admitting: Family Medicine

## 2020-12-18 NOTE — Telephone Encounter (Signed)
Pt dropped off Disability Parking form to be filled out Placed in color folder up front  Pt would like it mailed to her when completed

## 2020-12-18 NOTE — Telephone Encounter (Signed)
Patient dropped off a handicap form to be completed and she wants it mailed to her home whem completed.  Patient stated she is on oxygen.  Nina,cma

## 2020-12-18 NOTE — Telephone Encounter (Signed)
Signed.  Please fill in all of our information.  Thanks.

## 2020-12-18 NOTE — Telephone Encounter (Signed)
Filled out information and mailed to the patient's home today.  Nina,cma

## 2020-12-20 ENCOUNTER — Telehealth: Payer: Self-pay | Admitting: Family Medicine

## 2020-12-20 DIAGNOSIS — J309 Allergic rhinitis, unspecified: Secondary | ICD-10-CM

## 2020-12-20 MED ORDER — MONTELUKAST SODIUM 10 MG PO TABS: 10.0000 mg | ORAL_TABLET | Freq: Every day | ORAL | 3 refills | Status: DC

## 2020-12-20 NOTE — Telephone Encounter (Signed)
Send Medication to [facility] . montelukast (SINGULAIR) 10 MG tablet

## 2021-01-08 DIAGNOSIS — H43811 Vitreous degeneration, right eye: Secondary | ICD-10-CM | POA: Diagnosis not present

## 2021-01-15 ENCOUNTER — Ambulatory Visit (INDEPENDENT_AMBULATORY_CARE_PROVIDER_SITE_OTHER): Payer: Medicare Other | Admitting: Pharmacist

## 2021-01-15 DIAGNOSIS — E782 Mixed hyperlipidemia: Secondary | ICD-10-CM

## 2021-01-15 DIAGNOSIS — E1159 Type 2 diabetes mellitus with other circulatory complications: Secondary | ICD-10-CM

## 2021-01-15 DIAGNOSIS — I10 Essential (primary) hypertension: Secondary | ICD-10-CM

## 2021-01-15 DIAGNOSIS — J439 Emphysema, unspecified: Secondary | ICD-10-CM | POA: Diagnosis not present

## 2021-01-15 DIAGNOSIS — I5032 Chronic diastolic (congestive) heart failure: Secondary | ICD-10-CM

## 2021-01-15 DIAGNOSIS — I872 Venous insufficiency (chronic) (peripheral): Secondary | ICD-10-CM

## 2021-01-15 NOTE — Patient Instructions (Signed)
Visit Information  PATIENT GOALS: Goals Addressed              This Visit's Progress     Patient Stated   .  Medication Monitoring (pt-stated)        Patient Goals/Self-Care Activities . Over the next 90 days, patient will:  - take medications as prescribed check glucose periodically, document, and provide at future appointments check blood pressure periodically, document, and provide at future appointments collaborate with provider on medication access solutions        Patient verbalizes understanding of instructions provided today and agrees to view in [place].    Plan: Telephone follow up appointment with care management team member scheduled for:  ~ 12 weeks  [name], PharmD, [place], [place] Clinical Pharmacist [employer] [facility] [employer] [phone]
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
## 2021-01-16 ENCOUNTER — Encounter: Payer: Self-pay | Admitting: Pulmonary Disease

## 2021-01-16 ENCOUNTER — Telehealth: Payer: Self-pay | Admitting: Licensed Clinical Social Worker

## 2021-01-16 NOTE — Progress Notes (Deleted)
Heart and Vascular Care Navigation  01/16/2021  Martha Woodard 19-Jan-1966 [id number]  Reason for Referral:   Engaged with patient by telephone for initial visit for Heart and Vascular Care Coordination.                                                                                                   Assessment:                                      HRT/VAS Care Coordination    Patients Home Cardiology Office [facility]   Outpatient Care Team Social Worker   Social Worker Name: Margarito Liner [place], [phone]   Living arrangements for the past 2 months Single Family Home   Patient Has Concern With Paying Medical Bills No   Does Patient Have Prescription Coverage? Yes   Home Assistive Devices/Equipment Blood pressure cuff   DME Agency Other - Comment  cuff provided by [facility] History:                                                                             SDOH Screenings   Alcohol Screen: Not on file  Depression (PHQ2-9): Low Risk   . PHQ-2 Score: 0  Financial Resource Strain: Medium Risk  . Difficulty of Paying Living Expenses: Somewhat hard  Food Insecurity: No Food Insecurity  . Worried About Charity fundraiser in the Last Year: Never true  . Ran Out of Food in the Last Year: Never true  Housing: Low Risk   . Last Housing Risk Score: 0  Physical Activity: Not on file  Social Connections: Not on file  Stress: No Stress Concern Present  . Feeling of Stress : Not at all  Tobacco Use: Medium Risk  . Smoking Tobacco Use: Former Smoker  . Smokeless Tobacco Use: Never Used  Transportation Needs: No Transportation Needs  . Lack of Transportation (Medical): No  . Lack of Transportation (Non-Medical): No    SDOH Interventions: Financial Resources:    {CHL AMB HRT/VAS CCM FINANCIAL RESOURCES INTERVENTIONS:23728}  Food Insecurity:     Housing Insecurity:     Transportation:       Health Promotion Interventions:      Physical Inactivity {CHL AMB HRT/VAS OTHER REFERRALS:23729}  Smoking Cessation {CHL AMB HRT/VAS SMOKING CESSATON:23731}  Dietary Concerns ***  Health Coaching {CHL AMB HRT/VAS HEALTH COACHING:23732}   Other Care Navigation Interventions:     Inpatient/Outpatient Substance Abuse Counseling/Rehab Options ***  Provided Pharmacy assistance resources    Patient expressed Mental Health concerns {CHL AMB HRT/VAS MENTAL HEALTH CONCERN YES/NO:23730}  Patient Referred to: ***   Follow-up plan:

## 2021-01-16 NOTE — Progress Notes (Signed)
Heart and Vascular Care Navigation  01/16/2021  Martha Woodard 1966-01-21 [id number]  Reason for Referral:   Engaged with patient by telephone for initial visit for Heart and Vascular Care Coordination.                                                                                                   Assessment:               Martha Woodard received a referral from pharmacy team Martha Woodard and Martha Woodard. Pt shared she is in need of access to BP cuff. Martha Woodard clarified w/ Martha Woodard, clinic administrator at [facility] office that they do have cuffs available for eligible patients. Martha Woodard called and introduced self, role, reason for call. Pt confirmed she does not have a cuff and would be able to access a ride to get to the office and pick one up. She requests education regarding how to use cuff, I shared I would communicate that with office staff and see if they would be able to assist pt with that as needed.   Pt denies any additional questions/concerns at this time. Martha Woodard let her know that should she have any additional concerns/questions that the [facility] office or pharmacy team could let us know and we could get in touch with her again.   Martha Woodard contacted Martha Woodard and she confirms she will leave the cuff and request that a staff member assist the pt with education when she collects the cuff. Pharmacy team aware pt connected w/ cuff.                          HRT/VAS Care Coordination    Patients Home Cardiology Office [facility]   Outpatient Care Team Social Worker   Social Worker Name: Martha Woodard, [phone]   Living arrangements for the past 2 months Single Family Home   Patient Has Concern With Paying Medical Bills No   Does Patient Have Prescription Coverage? Yes   Home Assistive Devices/Equipment Blood pressure cuff   DME Agency Other - Comment  cuff provided by [place] History:                                                                              SDOH Screenings   Alcohol Screen: Not on file  Depression (PHQ2-9): Low Risk   . PHQ-2 Score: 0  Financial Resource Strain: Medium Risk  . Difficulty of Paying Living Expenses: Somewhat hard  Food Insecurity: No Food Insecurity  . Worried About Charity fundraiser in the Last Year: Never true  . Ran Out of Food in the Last Year: Never true  Housing: Low Risk   . Last Housing Risk Score: 0  Physical Activity:  Not on file  Social Connections: Not on file  Stress: No Stress Concern Present  . Feeling of Stress : Not at all  Tobacco Use: Medium Risk  . Smoking Tobacco Use: Former Smoker  . Smokeless Tobacco Use: Never Used  Transportation Needs: No Transportation Needs  . Lack of Transportation (Medical): No  . Lack of Transportation (Non-Medical): No    SDOH Interventions: Financial Resources:  Pt denied any current concerns   Food Insecurity:  Pt denied any current concerns   Housing Insecurity:  Pt denied any current concerns   Transportation:    Pt denied any current concerns    Other Care Navigation Interventions:     Patient Referred to: [place] to get a blood pressure cuff.   Follow-up plan:   Pt will pick up cuff from [facility], a CMA or RN will assist pt with learning how to properly utilize that. Pt aware I remain available as needed in the future should she have any additional questions/concerns.

## 2021-01-16 NOTE — Telephone Encounter (Signed)
Left HIPAA compliant for message at [phone], pt returned call, was able to engage pt for connection with BP cuff per referral.   Westley Hummer, MSW, [place]  [phone]

## 2021-01-17 DIAGNOSIS — S93401A Sprain of unspecified ligament of right ankle, initial encounter: Secondary | ICD-10-CM | POA: Diagnosis not present

## 2021-01-17 DIAGNOSIS — M654 Radial styloid tenosynovitis [de Quervain]: Secondary | ICD-10-CM | POA: Diagnosis not present

## 2021-01-17 NOTE — Telephone Encounter (Signed)
Patient came into office and picked up her Large size BP cuff. BP cuff machine set up. She was given a demonstration and instructions and was able to demonstrate back how to use. Patient was very grateful for the cuff and instruction.

## 2021-01-24 ENCOUNTER — Encounter: Payer: Self-pay | Admitting: Pulmonary Disease

## 2021-02-04 ENCOUNTER — Encounter: Payer: Self-pay | Admitting: Obstetrics and Gynecology

## 2021-02-05 ENCOUNTER — Other Ambulatory Visit (HOSPITAL_COMMUNITY)
Admission: RE | Admit: 2021-02-05 | Discharge: 2021-02-05 | Disposition: A | Discharge status: Home / Self Care | Payer: Medicare Other | Source: Ambulatory Visit | Attending: Obstetrics and Gynecology | Admitting: Obstetrics and Gynecology

## 2021-02-05 ENCOUNTER — Ambulatory Visit (INDEPENDENT_AMBULATORY_CARE_PROVIDER_SITE_OTHER): Payer: Medicare Other | Admitting: Obstetrics and Gynecology

## 2021-02-05 ENCOUNTER — Encounter: Payer: Self-pay | Admitting: Obstetrics and Gynecology

## 2021-02-05 ENCOUNTER — Other Ambulatory Visit: Payer: Self-pay

## 2021-02-05 VITALS — BP 130/69 | HR 85 | Ht 66.0 in | Wt 252.2 lb

## 2021-02-05 DIAGNOSIS — B372 Candidiasis of skin and nail: Secondary | ICD-10-CM | POA: Diagnosis not present

## 2021-02-05 DIAGNOSIS — Z01419 Encounter for gynecological examination (general) (routine) without abnormal findings: Secondary | ICD-10-CM | POA: Diagnosis present

## 2021-02-05 DIAGNOSIS — N9089 Other specified noninflammatory disorders of vulva and perineum: Secondary | ICD-10-CM

## 2021-02-05 DIAGNOSIS — Z1151 Encounter for screening for human papillomavirus (HPV): Secondary | ICD-10-CM | POA: Insufficient documentation

## 2021-02-05 DIAGNOSIS — Z124 Encounter for screening for malignant neoplasm of cervix: Secondary | ICD-10-CM | POA: Insufficient documentation

## 2021-02-05 MED ORDER — NYSTATIN 100000 UNIT/GM EX POWD: 1.0000 "application " | Freq: Two times a day (BID) | CUTANEOUS | 11 refills | Status: DC

## 2021-02-05 NOTE — Patient Instructions (Signed)
Preventive Care 57-57 Years Old, Female Preventive care refers to lifestyle choices and visits with your health care provider that can promote health and wellness. This includes:  A yearly physical exam. This is also called an annual wellness visit.  Regular dental and eye exams.  Immunizations.  Screening for certain conditions.  Healthy lifestyle choices, such as: ? Eating a healthy diet. ? Getting regular exercise. ? Not using drugs or products that contain nicotine and tobacco. ? Limiting alcohol use. What can I expect for my preventive care visit? Physical exam Your health care provider will check your:  Height and weight. These may be used to calculate your BMI (body mass index). BMI is a measurement that tells if you are at a healthy weight.  Heart rate and blood pressure.  Body temperature.  Skin for abnormal spots. Counseling Your health care provider may ask you questions about your:  Past medical problems.  Family's medical history.  Alcohol, tobacco, and drug use.  Emotional well-being.  Home life and relationship well-being.  Sexual activity.  Diet, exercise, and sleep habits.  Work and work Statistician.  Access to firearms.  Method of birth control.  Menstrual cycle.  Pregnancy history. What immunizations do I need? Vaccines are usually given at various ages, according to a schedule. Your health care provider will recommend vaccines for you based on your age, medical history, and lifestyle or other factors, such as travel or where you work.   What tests do I need? Blood tests  Lipid and cholesterol levels. These may be checked every 5 years, or more often if you are over 3 years old.  Hepatitis C test.  Hepatitis B test. Screening  Lung cancer screening. You may have this screening every year starting at age 57 if you have a 30-pack-year history of smoking and currently smoke or have quit within the past 15 years.  Colorectal cancer  screening. ? All adults should have this screening starting at age 57 and continuing until age 17. ? Your health care provider may recommend screening at age 49 if you are at increased risk. ? You will have tests every 1-10 years, depending on your results and the type of screening test.  Diabetes screening. ? This is done by checking your blood sugar (glucose) after you have not eaten for a while (fasting). ? You may have this done every 1-3 years.  Mammogram. ? This may be done every 1-2 years. ? Talk with your health care provider about when you should start having regular mammograms. This may depend on whether you have a family history of breast cancer.  BRCA-related cancer screening. This may be done if you have a family history of breast, ovarian, tubal, or peritoneal cancers.  Pelvic exam and Pap test. ? This may be done every 3 years starting at age 57. ? Starting at age 57, this may be done every 5 years if you have a Pap test in combination with an HPV test. Other tests  STD (sexually transmitted disease) testing, if you are at risk.  Bone density scan. This is done to screen for osteoporosis. You may have this scan if you are at high risk for osteoporosis. Talk with your health care provider about your test results, treatment options, and if necessary, the need for more tests. Follow these instructions at home: Eating and drinking  Eat a diet that includes fresh fruits and vegetables, whole grains, lean protein, and low-fat dairy products.  Take vitamin and mineral supplements  as recommended by your health care provider.  Do not drink alcohol if: ? Your health care provider tells you not to drink. ? You are pregnant, may be pregnant, or are planning to become pregnant.  If you drink alcohol: ? Limit how much you have to 0-1 drink a day. ? Be aware of how much alcohol is in your drink. In the [place] one drink equals one 12 oz bottle of beer (355 mL), one 5 oz glass of  wine (148 mL), or one 1 oz glass of hard liquor (44 mL).   Lifestyle  Take daily care of your teeth and gums. Brush your teeth every morning and night with fluoride toothpaste. Floss one time each day.  Stay active. Exercise for at least 30 minutes 5 or more days each week.  Do not use any products that contain nicotine or tobacco, such as cigarettes, e-cigarettes, and chewing tobacco. If you need help quitting, ask your health care provider.  Do not use drugs.  If you are sexually active, practice safe sex. Use a condom or other form of protection to prevent STIs (sexually transmitted infections).  If you do not wish to become pregnant, use a form of birth control. If you plan to become pregnant, see your health care provider for a prepregnancy visit.  If told by your health care provider, take low-dose aspirin daily starting at age 65.  Find healthy ways to cope with stress, such as: ? Meditation, yoga, or listening to music. ? Journaling. ? Talking to a trusted person. ? Spending time with friends and family. Safety  Always wear your seat belt while driving or riding in a vehicle.  Do not drive: ? If you have been drinking alcohol. Do not ride with someone who has been drinking. ? When you are tired or distracted. ? While texting.  Wear a helmet and other protective equipment during sports activities.  If you have firearms in your house, make sure you follow all gun safety procedures. What's next?  Visit your health care provider once a year for an annual wellness visit.  Ask your health care provider how often you should have your eyes and teeth checked.  Stay up to date on all vaccines. This information is not intended to replace advice given to you by your health care provider. Make sure you discuss any questions you have with your health care provider. Document Revised: 06/19/2020 Document Reviewed: 05/27/2018 Elsevier Patient Education  2021 [place].    Skin Yeast Infection  A skin yeast infection is a condition in which there is an overgrowth of yeast (candida) that normally lives on the skin. This condition usually occurs in areas of the skin that are constantly warm and moist, such as the armpits or the groin. What are the causes? This condition is caused by a change in the normal balance of the yeast and bacteria that live on the skin. What increases the risk? You are more likely to develop this condition if you:  Are obese.  Are pregnant.  Take birth control pills.  Have diabetes.  Take antibiotic medicines.  Take steroid medicines.  Are malnourished.  Have a weak body defense system (immune system).  Are 69 years of age or older.  Wear tight clothing. What are the signs or symptoms? The most common symptom of this condition is itchiness in the affected area. Other symptoms include:  Red, swollen area of the skin.  Bumps on the skin. How is this diagnosed?  This condition is diagnosed with a medical history and physical exam.  Your health care provider may check for yeast by taking light scrapings of the skin to be viewed under a microscope. How is this treated? This condition is treated with medicine. Medicines may be prescribed or be available over the counter. The medicines may be:  Taken by mouth (orally).  Applied as a cream or powder to your skin. Follow these instructions at home:  Take or apply over-the-counter and prescription medicines only as told by your health care provider.  Maintain a healthy weight. If you need help losing weight, talk with your health care provider.  Keep your skin clean and dry.  If you have diabetes, keep your blood sugar under control.  Keep all follow-up visits as told by your health care provider. This is important.   Contact a health care provider if:  Your symptoms go away and then return.  Your symptoms do not get better with treatment.  Your  symptoms get worse.  Your rash spreads.  You have a fever or chills.  You have new symptoms.  You have new warmth or redness of your skin. Summary  A skin yeast infection is a condition in which there is an overgrowth of yeast (candida) that normally lives on the skin. This condition is caused by a change in the normal balance of the yeast and bacteria that live on the skin.  Take or apply over-the-counter and prescription medicines only as told by your health care provider.  Keep your skin clean and dry.  Contact a health care provider if your symptoms do not get better with treatment. This information is not intended to replace advice given to you by your health care provider. Make sure you discuss any questions you have with your health care provider. Document Revised: 02/02/2018 Document Reviewed: 02/02/2018 Elsevier Patient Education  2021 [place] Breast self-awareness means being familiar with how your breasts look and feel. It involves checking your breasts regularly and reporting any changes to your health care provider. Practicing breast self-awareness is important. Sometimes changes may not be harmful (are benign), but sometimes a change in your breasts can be a sign of a serious medical problem. It is important to learn how to do this procedure correctly so that you can catch problems early, when treatment is more likely to be successful. All women should practice breast self-awareness, including women who have had breast implants. What you need:  A mirror.  A well-lit room. How to do a breast self-exam A breast self-exam is one way to learn what is normal for your breasts and whether your breasts are changing. To do a breast self-exam: Look for changes 1. Remove all the clothing above your waist. 2. Stand in front of a mirror in a room with good lighting. 3. Put your hands on your hips. 4. Push your hands firmly downward. 5. Compare your  breasts in the mirror. Look for differences between them (asymmetry), such as: ? Differences in shape. ? Differences in size. ? Puckers, dips, and bumps in one breast and not the other. 6. Look at each breast for changes in the skin, such as: ? Redness. ? Scaly areas. 7. Look for changes in your nipples, such as: ? Discharge. ? Bleeding. ? Dimpling. ? Redness. ? A change in position.   Feel for changes Carefully feel your breasts for lumps and changes. It is best to do this while lying on your back  on the floor, and again while sitting or standing in the tub or shower with soapy water on your skin. Feel each breast in the following way: 1. Place the arm on the side of the breast you are examining above your head. 2. Feel your breast with the other hand. 3. Start in the nipple area and make -inch (2 cm) overlapping circles to feel your breast. Use the pads of your three middle fingers to do this. Apply light pressure, then medium pressure, then firm pressure. The light pressure will allow you to feel the tissue closest to the skin. The medium pressure will allow you to feel the tissue that is a little deeper. The firm pressure will allow you to feel the tissue close to the ribs. 4. Continue the overlapping circles, moving downward over the breast until you feel your ribs below your breast. 5. Move one finger-width toward the center of the body. Continue to use the -inch (2 cm) overlapping circles to feel your breast as you move slowly up toward your collarbone. 6. Continue the up-and-down exam using all three pressures until you reach your armpit.   Write down what you find Writing down what you find can help you remember what to discuss with your health care provider. Write down:  What is normal for each breast.  Any changes that you find in each breast, including: ? The kind of changes you find. ? Any pain or tenderness. ? Size and location of any lumps.  Where you are in your  menstrual cycle, if you are still menstruating. General tips and recommendations  Examine your breasts every month.  If you are breastfeeding, the best time to examine your breasts is after a feeding or after using a breast pump.  If you menstruate, the best time to examine your breasts is 5-7 days after your period. Breasts are generally lumpier during menstrual periods, and it may be more difficult to notice changes.  With time and practice, you will become more familiar with the variations in your breasts and more comfortable with the exam. Contact a health care provider if you:  See a change in the shape or size of your breasts or nipples.  See a change in the skin of your breast or nipples, such as a reddened or scaly area.  Have unusual discharge from your nipples.  Find a lump or thick area that was not there before.  Have pain in your breasts.  Have any concerns related to your breast health. Summary  Breast self-awareness includes looking for physical changes in your breasts, as well as feeling for any changes within your breasts.  Breast self-awareness should be performed in front of a mirror in a well-lit room.  You should examine your breasts every month. If you menstruate, the best time to examine your breasts is 5-7 days after your menstrual period.  Let your health care provider know of any changes you notice in your breasts, including changes in size, changes on the skin, pain or tenderness, or unusual fluid from your nipples. This information is not intended to replace advice given to you by your health care provider. Make sure you discuss any questions you have with your health care provider. Document Revised: 05/04/2018 Document Reviewed: 05/04/2018 Elsevier Patient Education  [place].

## 2021-02-05 NOTE — Progress Notes (Signed)
ANNUAL PREVENTATIVE CARE GYNECOLOGY  ENCOUNTER NOTE  Subjective:       Martha Woodard is a 55 y.o. G1P0010 postmenopausal female here to establish care, and for a routine annual gynecologic exam. Reports she has not seen a GYN ~ 10 years.  She does have a PCP whom she sees fairly regularly. The patient is not sexually active. The patient has never been on taking hormone replacement therapy. Patient denies post-menopausal vaginal bleeding. The patient participates in regular exercise: no. Has the patient ever been transfused or tattooed?: no. The patient reports that there is not domestic violence in her life.  Current complaints: 1.  Reports some "excess skin" in the pelvic region. Lamonte Sakai been there for some time. Notes that it gets caught on her underwear. Would like to discuss removal if possible.     Gynecologic History Patient's last menstrual period was 04/10/2014. Contraception: post menopausal status Last Pap: 02/13/2016. Results were: normal. Denies history of abnormal pap smears.  Last mammogram: 04/24/2020. Results were: normal Last Colonoscopy: 01/01/2016.  Results were normal.     Obstetric History OB History  Gravida Para Term Preterm AB Living  1 0     1    SAB IAB Ectopic Multiple Live Births  1            # Outcome Date GA Lbr Len/2nd Weight Sex Delivery Anes PTL Lv  1 SAB             Obstetric Comments  1st Menstrual Cycle:  11  1st Pregnancy: 18    Past Medical History:  Diagnosis Date  . CHF (congestive heart failure) ([place])   . Colon polyp 01-01-2016  . COPD (chronic obstructive pulmonary disease) ([place])   . Diabetes mellitus without complication ([place])   . Diastolic heart failure ([place])   . HLD (hyperlipidemia)   . Hypertension   . Morbid obesity with BMI of 45.0-49.9, adult ([place])   . Multiple lung nodules on CT   . Shortness of breath dyspnea     Family History  Problem Relation Age of Onset  . Cancer Mother 9       uterian and ovarian   . Goiter Mother    . Stroke Father   . Breast cancer Sister 40  . Stroke Brother   . Breast cancer Sister 63  . Heart murmur Sister     Past Surgical History:  Procedure Laterality Date  . BIOPSY THYROID Right 02-06-14   NEGATIVE FOR MALIGNANT CELLS proteinaceous material and macrophages.  Marland Kitchen BREAST BIOPSY Left    neg- core  . COLONOSCOPY WITH PROPOFOL N/A 01/01/2016   Procedure: COLONOSCOPY WITH PROPOFOL;  Surgeon: Lucilla Lame, MD;  Location: [facility] ENDOSCOPY;  Service: Endoscopy;  Laterality: N/A;  . thryoid fna Right April 2015   Proteinaceous material and macrophages.  . WISDOM TOOTH EXTRACTION      Social History   Socioeconomic History  . Marital status: Single    Spouse name: Not on file  . Number of children: Not on file  . Years of education: Not on file  . Highest education level: Not on file  Occupational History  . Not on file  Tobacco Use  . Smoking status: Former Smoker    Packs/day: 3.00    Years: 33.00    Pack years: 99.00    Quit date: 07/10/2013    Years since quitting: 7.5  . Smokeless tobacco: Never Used  Vaping Use  . Vaping Use: Former  Substance  and Sexual Activity  . Alcohol use: No    Alcohol/week: 0.0 standard drinks  . Drug use: No  . Sexual activity: Not Currently  Other Topics Concern  . Not on file  Social History Narrative  . Not on file   Social Determinants of Health   Financial Resource Strain: Medium Risk  . Difficulty of Paying Living Expenses: Somewhat hard  Food Insecurity: No Food Insecurity  . Worried About Charity fundraiser in the Last Year: Never true  . Ran Out of Food in the Last Year: Never true  Transportation Needs: No Transportation Needs  . Lack of Transportation (Medical): No  . Lack of Transportation (Non-Medical): No  Physical Activity: Not on file  Stress: No Stress Concern Present  . Feeling of Stress : Not at all  Social Connections: Not on file  Intimate Partner Violence: Not At Risk  . Fear of Current or Ex-Partner:  No  . Emotionally Abused: No  . Physically Abused: No  . Sexually Abused: No    Current Outpatient Medications on File Prior to Visit  Medication Sig Dispense Refill  . Accu-Chek Softclix Lancets lancets TEST UP TO 4 TIMES DAILY 400 each 3  . acetaminophen (TYLENOL) 650 MG CR tablet Take 650 mg by mouth every 8 (eight) hours as needed for pain.    Marland Kitchen albuterol (PROVENTIL) (2.5 MG/3ML) 0.083% nebulizer solution Take 3 mLs (2.5 mg total) by nebulization every 6 (six) hours as needed for wheezing or shortness of breath. 75 mL 12  . aspirin EC 81 MG tablet Take 1 tablet (81 mg total) by mouth daily. 90 tablet 3  . blood glucose meter kit and supplies Dispense based on patient and insurance preference. Use up to four times daily as directed. (FOR ICD-9 250.00, 250.01). 1 each 0  . calcium-vitamin D (OSCAL WITH D) 500-200 MG-UNIT tablet Take 1 tablet by mouth 2 (two) times daily.    . cetirizine (ZYRTEC) 10 MG tablet Take 10 mg by mouth daily.    . fluticasone (FLONASE) 50 MCG/ACT nasal spray USE 1 OR 2 SPRAYS IN EACH NOSTRIL ONCE DAILY. 16 g 12  . furosemide (LASIX) 20 MG tablet Take 1 tablet (20 mg total) by mouth daily as needed. 90 tablet 3  . glucose blood (ACCU-CHEK AVIVA PLUS) test strip TEST UP TO 4 TIMES DAILY AS DIRECTED 100 strip 13  . Glycopyrrolate-Formoterol (BEVESPI AEROSPHERE) 9-4.8 MCG/ACT AERO Inhale 2 puffs into the lungs 2 (two) times daily. 10.7 g 11  . hydrochlorothiazide (MICROZIDE) 12.5 MG capsule Take 1 capsule (12.5 mg total) by mouth daily. 90 capsule 3  . ketoconazole (NIZORAL) 2 % cream Apply 1 application topically daily. 15 g 0  . ketoconazole (NIZORAL) 2 % shampoo Apply 1 application topically 2 (two) times a week. 120 mL 0  . losartan (COZAAR) 25 MG tablet Take 1 tablet (25 mg total) by mouth 2 (two) times daily. 180 tablet 3  . metFORMIN (GLUCOPHAGE) 500 MG tablet TAKE 2 TABLETS BY MOUTH  TWICE DAILY WITH MEALS 360 tablet 3  . metoprolol succinate (TOPROL-XL) 25 MG  24 hr tablet Take 1 tablet (25 mg total) by mouth daily. 90 tablet 3  . montelukast (SINGULAIR) 10 MG tablet Take 1 tablet (10 mg total) by mouth at bedtime. 30 tablet 3  . Multiple Vitamin (MULTIVITAMIN) tablet Take 1 tablet by mouth daily.    Marland Kitchen nystatin (MYCOSTATIN) 100000 UNIT/ML suspension Take 5 mLs (500,000 Units total) by mouth 4 (four) times  daily. X 7-10 days (Patient taking differently: Take 5 mLs by mouth 4 (four) times daily. X 7-10 days; as needed) 473 mL 0  . nystatin cream (MYCOSTATIN) Apply 1 application topically 2 (two) times daily. (Patient taking differently: Apply 1 application topically 2 (two) times daily. As needed) 30 g 0  . ondansetron (ZOFRAN ODT) 4 MG disintegrating tablet Take 1 tablet (4 mg total) by mouth every 8 (eight) hours as needed for nausea or vomiting. 20 tablet 0  . OXYGEN Inhale 2 L into the lungs daily. 2 liters at night, 1 liter when doing activities, and no oxygen when at home during the day.    . potassium chloride (KLOR-CON) 10 MEQ tablet Take 1 tablet (10 mEq total) by mouth 2 (two) times daily. 180 tablet 3  . PROVENTIL HFA 108 (90 Base) MCG/ACT inhaler Inhale 2 puffs into the lungs every 6 (six) hours as needed for wheezing or shortness of breath. 18 gram inhaler 3 each 1  . rosuvastatin (CRESTOR) 20 MG tablet Take 1 tablet (20 mg total) by mouth daily. 90 tablet 3  . sodium chloride (OCEAN) 0.65 % SOLN nasal spray Place 2 sprays into both nostrils as needed for congestion. 30 mL 11  . vitamin C (ASCORBIC ACID) 500 MG tablet Take 1,000 mg by mouth daily.     . Vitamin E 180 MG CAPS Take 1 capsule by mouth daily.     No current facility-administered medications on file prior to visit.    Allergies  Allergen Reactions  . Augmentin [Amoxicillin-Pot Clavulanate] Itching    Hives Can take PCN and Amoxicillin alone       Review of Systems ROS Review of Systems - General ROS: negative for - chills, fatigue, fever, hot flashes, night sweats,  weight gain or weight loss Psychological ROS: negative for - anxiety, decreased libido, depression, mood swings, physical abuse or sexual abuse Ophthalmic ROS: negative for - blurry vision, eye pain or loss of vision ENT ROS: negative for - headaches, hearing change, visual changes or vocal changes Allergy and Immunology ROS: negative for - hives, itchy/watery eyes or seasonal allergies Hematological and Lymphatic ROS: negative for - bleeding problems, bruising, swollen lymph nodes or weight loss Endocrine ROS: negative for - galactorrhea, hair pattern changes, hot flashes, malaise/lethargy, mood swings, palpitations, polydipsia/polyuria, skin changes, temperature intolerance or unexpected weight changes Breast ROS: negative for - new or changing breast lumps or nipple discharge Respiratory ROS: negative for - cough or shortness of breath Cardiovascular ROS: negative for - chest pain, irregular heartbeat, palpitations or shortness of breath Gastrointestinal ROS: no abdominal pain, change in bowel habits, or black or bloody stools Genito-Urinary ROS: no dysuria, trouble voiding, or hematuria Musculoskeletal ROS: negative for - joint pain or joint stiffness Neurological ROS: negative for - bowel and bladder control changes Dermatological ROS: positive for rash and skin changes beneath breasts, pannus, and thighs.  Objective:   BP 130/69   Pulse 85   Ht 5' 6"  (1.676 m)   Wt 252 lb 3.2 oz (114.4 kg)   LMP 04/10/2014   BMI 40.71 kg/m  CONSTITUTIONAL: Well-developed, well-nourished female in no acute distress.  PSYCHIATRIC: Normal mood and affect. Normal behavior. Normal judgment and thought content. [place]: Alert and oriented to person, place, and time. Normal muscle tone coordination. No cranial nerve deficit noted. HENT:  Normocephalic, atraumatic, External right and left ear normal. Oropharynx is clear and moist EYES: Conjunctivae and EOM are normal. Pupils are equal, round, and  reactive  to light. No scleral icterus.  NECK: Normal range of motion, supple, no masses.  Normal thyroid.  SKIN: Skin is warm and dry.  Not diaphoretic. No erythema. No pallor.  Erythematous macular rash noted in breast folds, abdominal fold and right groin region.  CARDIOVASCULAR: Normal heart rate noted, regular rhythm, no murmur. RESPIRATORY: Clear to auscultation bilaterally. Effort and breath sounds normal, no problems with respiration noted. BREASTS: Symmetric in size. No masses, skin changes, nipple drainage, or lymphadenopathy. ABDOMEN: Soft, normal bowel sounds, no distention noted.  No tenderness, rebound or guarding.  BLADDER: Normal PELVIC:  Bladder no bladder distension noted  Urethra: normal appearing urethra with no masses, tenderness or lesions  Vulva: left labia minora with large area of redundant tissue (approximately golf ball sized).   Vagina: mildly atrophic, no lesions or discharge.   Cervix: normal appearing cervix without discharge or lesions, mildly atrophic  Uterus: uterus is normal size, shape, consistency and nontender  Adnexa: normal adnexa in size, nontender and no masses  RV: External Exam NormaI, No Rectal Masses and Normal Sphincter tone  MUSCULOSKELETAL: Normal range of motion. No tenderness.  No cyanosis, clubbing, or edema.  2+ distal pulses. LYMPHATIC: No Axillary, Supraclavicular, or Inguinal Adenopathy.   Labs: Lab Results  Component Value Date   WBC 5.4 08/22/2020   HGB 14.2 08/22/2020   HCT 43.8 08/22/2020   MCV 83.6 08/22/2020   PLT 261.0 08/22/2020    Lab Results  Component Value Date   CREATININE 0.59 08/22/2020   BUN 12 08/22/2020   NA 141 08/22/2020   K 4.5 08/22/2020   CL 99 08/22/2020   CO2 36 (H) 08/22/2020    Lab Results  Component Value Date   ALT 35 09/20/2020   AST 37 09/20/2020   ALKPHOS 56 09/20/2020   BILITOT 0.4 09/20/2020    Lab Results  Component Value Date   CHOL 99 08/22/2020   HDL 39.70 08/22/2020    LDLCALC 33 08/22/2020   LDLDIRECT 57.0 07/27/2018   TRIG 131.0 08/22/2020   CHOLHDL 2 08/22/2020    Lab Results  Component Value Date   TSH 0.87 08/22/2020    Lab Results  Component Value Date   HGBA1C 6.7 (A) 11/23/2020     Assessment:   1. Encounter for well woman exam with routine gynecological exam   2. Pap smear for cervical cancer screening   3. Yeast infection of the skin   4. Skin tag of vulva     Plan:  Pap: Pap Co Test performed today.  Mammogram: Not Ordered. Up to date.  Stool Guaiac Testing:  Not Ordered. Colonoscopy up to date.  Labs: Up to date Routine preventative health maintenance measures emphasized: Exercise/Diet/Weight control, Tobacco Warnings, Alcohol/Substance use risks and Stress Management COVID Vaccination status: has completed series and booster.  Skin yeast infection, has tried OTC powders and nystatin cream with little relief.  Will also prescribe Nystatin powder.  Excess skin (skin tag) of labia. Patient desires removal.  Will schedule in the next 1-2 weeks. Return to [place] for annual exam.    [name], MD  [facility]

## 2021-02-05 NOTE — Progress Notes (Signed)
Pt present for est care for well woman care. Pt stated that she was doing well.

## 2021-02-06 LAB — HM DIABETES EYE EXAM

## 2021-02-11 LAB — CYTOLOGY - PAP
Comment: NEGATIVE
Diagnosis: NEGATIVE
Diagnosis: REACTIVE
High risk HPV: NEGATIVE

## 2021-02-13 ENCOUNTER — Other Ambulatory Visit: Payer: Self-pay

## 2021-02-19 ENCOUNTER — Other Ambulatory Visit: Payer: Self-pay

## 2021-02-19 ENCOUNTER — Other Ambulatory Visit (HOSPITAL_COMMUNITY)
Admission: RE | Admit: 2021-02-19 | Discharge: 2021-02-19 | Disposition: A | Discharge status: Home / Self Care | Payer: Medicare Other | Source: Ambulatory Visit | Attending: Obstetrics and Gynecology | Admitting: Obstetrics and Gynecology

## 2021-02-19 ENCOUNTER — Ambulatory Visit: Payer: Medicare Other | Admitting: Obstetrics and Gynecology

## 2021-02-19 ENCOUNTER — Encounter: Payer: Self-pay | Admitting: Obstetrics and Gynecology

## 2021-02-19 VITALS — BP 145/80 | HR 86 | Ht 66.0 in | Wt 251.2 lb

## 2021-02-19 DIAGNOSIS — N9089 Other specified noninflammatory disorders of vulva and perineum: Secondary | ICD-10-CM | POA: Insufficient documentation

## 2021-02-19 NOTE — Progress Notes (Signed)
    GYNECOLOGY PROGRESS NOTE  Subjective:    Patient ID: Martha Woodard, female    DOB: Mar 28, 1966, 55 y.o.   MRN: [id number]  HPI  Patient is a 55 y.o. G105P0010 female who presents for skin tag removal. Notes that the skin tags have been there for quite some time. Causes discomfort with wearing undergarments.   The following portions of the patient's history were reviewed and updated as appropriate: allergies, current medications, past family history, past medical history, past social history, past surgical history and problem list.  Review of Systems Pertinent items noted in HPI and remainder of comprehensive ROS otherwise negative.   Objective:   Blood pressure (!) 145/80, pulse 86, height 5\' 6"  (1.676 m), weight 251 lb 3.2 oz (113.9 kg), last menstrual period 04/10/2014. General appearance: alert and no distress Abdomen: soft, non-tender; bowel sounds normal; no masses,  no organomegaly Pelvic: External genitalia with left labia minora with large area of redundant tissue (approximately golf ball sized) on stalk.  Smaller skin tag (possibly genital wart) ~ 1 x 1 cm approximately 1 cm lateral to larger mass.  Rectovaginal septum normal. Internal exam not performed.    Assessment:   1. Vulvar skin tag     Plan:   1. Vulvar skin tags removed at patient's request.  Advised to f/u in 2 weeks to reassess excised area.  Given red flag symptoms of when to notify physician.    VULVAR BIOPSY NOTE The indications for vulvar biopsy (for comfort measures) were reviewed.   Risks of the biopsy including pain, bleeding, infection, inadequate specimen, scarring and need for additional procedures  were discussed. The patient stated understanding and agreed to undergo procedure today. Consent was signed.  The patient's vulva was prepped with Betadine. 1% lidocaine was injected into the left vulvar region at the site of both skin tags. The scalpel was used to excise the two vulvar lesions; biopsy  tissue was picked up with sterile forceps and placed in the specimen cup. Moderate amount of bleeding was noted (due to patient's aspirin use) and hemostasis was achieved using silver nitrate sticks and bovie.  The larger excised area was approximated with 4-0 Vicryl in a running fashion. The patient tolerated the procedure well. Post-procedure instructions were given to the patient. The patient is to call with heavy bleeding, fever greater than 100.4, foul smelling vaginal discharge or other concerns. The patient will be return to clinic in two weeks for discussion of results and reassessment of biopsy sites.   [name], MD [facility]

## 2021-02-19 NOTE — Progress Notes (Signed)
Pt present for skin tag removal. Pt stated that she was doing well.

## 2021-02-19 NOTE — Patient Instructions (Signed)
Vulva Biopsy, Care After This sheet gives you information about how to care for yourself after your procedure. Your doctor may also give you more specific instructions. If you have problems or questions, contact your doctor. What can I expect after the procedure? After the procedure, it is common to have:  Slight bleeding from the biopsy site.  Soreness at the biopsy site. Follow these instructions at home: Biopsy site care  Follow instructions from your doctor about how to take care of your biopsy site. Make sure you: ? Clean the area using water and mild soap two times a day or as told by your doctor. Gently pat the area dry. ? If you were prescribed an antibiotic ointment, apply it as told by your doctor. Do not stop using the antibiotic even if your condition gets better. ? Take a warm water bath (sitz bath) as needed to help with pain. A sitz bath is taken while you are sitting down. The water should only come up to your hips and cover your butt. ? Leave stitches (sutures), skin glue, or skin tape (adhesive) strips in place. They may need to stay in place for 2 weeks or longer. If tape strips get loose and curl up, you may trim the loose edges. Do not remove tape strips completely unless your doctor says it is okay. ? Check your biopsy area every day for signs of infection. Check for:  More redness, swelling, or pain.  More fluid or blood.  Warmth.  Pus or a bad smell. ? Do not rub the biopsy area after peeing (urinating).  Gently pat the area dry, or use a bottle filled with warm water (peri-bottle) to clean the area.  Gently wipe from front to back.   Lifestyle  Wear loose, cotton underwear.  Do not wear tight pants.  Do not use a tampon, douche, or put anything in your vagina for at least 1 week or until your doctor says it is okay.  Do not have sex for at least 1 week or until your doctor says it is okay.  Do not exercise until your doctor says it is okay.  Do not  swim or use a hot tub until your doctor says it is okay. You may shower or take a sitz bath. General instructions  Take over-the-counter and prescription medicines only as told by your doctor.  Use a sanitary pad until the bleeding stops.  Keep all follow-up visits as told by your doctor. This is important. Contact a doctor if:  You have more redness, swelling, or pain around your biopsy site.  You have more fluid or blood coming from your biopsy site.  Your biopsy site feels warm when you touch it.  Medicines do not help with your pain. Get help right away if:  You have a lot of bleeding from the vulva.  You have pus or a bad smell coming from your biopsy site.  You have a fever.  You have pain in the lower belly (abdomen). Summary  After the procedure, it is common to have slight bleeding and soreness at the biopsy site.  Follow all instructions as told by your doctor. Clean the area with water and mild soap. Do not rub. Pat the area dry.  Take sitz baths as needed. Leave any stitches in place.  Check your biopsy site for infection. Signs include more redness, swelling, pain, fluid, or blood, or feeling warm when you touch it.  Get help right away if you have  a lot of bleeding, a fever, pus or a bad smell, or pain in your lower belly. This information is not intended to replace advice given to you by your health care provider. Make sure you discuss any questions you have with your health care provider. Document Revised: 03/18/2018 Document Reviewed: 03/18/2018 Elsevier Patient Education  2021 [employer].

## 2021-02-20 ENCOUNTER — Encounter: Payer: BLUE CROSS/BLUE SHIELD | Admitting: Obstetrics and Gynecology

## 2021-02-20 ENCOUNTER — Ambulatory Visit (INDEPENDENT_AMBULATORY_CARE_PROVIDER_SITE_OTHER): Payer: Medicare Other | Admitting: Family Medicine

## 2021-02-20 ENCOUNTER — Encounter: Payer: Self-pay | Admitting: Family Medicine

## 2021-02-20 DIAGNOSIS — J439 Emphysema, unspecified: Secondary | ICD-10-CM | POA: Diagnosis not present

## 2021-02-20 DIAGNOSIS — R252 Cramp and spasm: Secondary | ICD-10-CM | POA: Diagnosis not present

## 2021-02-20 DIAGNOSIS — J309 Allergic rhinitis, unspecified: Secondary | ICD-10-CM

## 2021-02-20 DIAGNOSIS — E1159 Type 2 diabetes mellitus with other circulatory complications: Secondary | ICD-10-CM

## 2021-02-20 DIAGNOSIS — E041 Nontoxic single thyroid nodule: Secondary | ICD-10-CM

## 2021-02-20 LAB — BASIC METABOLIC PANEL
BUN: 11 mg/dL (ref 6–23)
CO2: 37 mEq/L — ABNORMAL HIGH (ref 19–32)
Calcium: 10.2 mg/dL (ref 8.4–10.5)
Chloride: 97 mEq/L (ref 96–112)
Creatinine, Ser: 0.57 mg/dL (ref 0.40–1.20)
GFR: 101.57 mL/min (ref >60.00)
Glucose, Bld: 85 mg/dL (ref 70–99)
Potassium: 4.6 mEq/L (ref 3.5–5.1)
Sodium: 141 mEq/L (ref 135–145)

## 2021-02-20 NOTE — Assessment & Plan Note (Signed)
Has been well controlled.  She will continue metformin 1000 mg twice daily.  Check A1c at next office visit.

## 2021-02-20 NOTE — Progress Notes (Signed)
Martha Rumps, MD Phone: [phone]  Martha Woodard is a 55 y.o. female who presents today for f/u.  COPD: Medication compliance- taking bevespi  Rescue inhaler use- rarely Dyspnea- no  Wheezing- no  Cough- chronic and stable   The patient also continues on Singulair, Zyrtec, and Flonase.  Notes her nasal saline spray helps the most with keeping her nose is clear.  DIABETES Disease Monitoring: Blood Sugar ranges-mostly <130s Polyuria/phagia/dipsia- no      Optho- UTD Medications: Compliance- taking metformin Hypoglycemic symptoms- only if she skips a meal  Thyroid nodule: Patient notes no difference in this on her own exam.  In the past she has declined follow-up with ultrasound.  Foot cramps: Notes over the last couple weeks has had cramps in her arches in her feet.  Typically occur in the afternoon or evening.  Do not occur daily.  She drinks four 16 ounce bottles of water a day.  Does take a potassium supplement.  She notices when she eats a banana the cramps resolve.     Social History   Tobacco Use  Smoking Status Former Smoker  . Packs/day: 3.00  . Years: 33.00  . Pack years: 99.00  . Quit date: 07/10/2013  . Years since quitting: 7.6  Smokeless Tobacco Never Used    Current Outpatient Medications on File Prior to Visit  Medication Sig Dispense Refill  . Accu-Chek Softclix Lancets lancets TEST UP TO 4 TIMES DAILY 400 each 3  . acetaminophen (TYLENOL) 650 MG CR tablet Take 650 mg by mouth every 8 (eight) hours as needed for pain.    Marland Kitchen albuterol (PROVENTIL) (2.5 MG/3ML) 0.083% nebulizer solution Take 3 mLs (2.5 mg total) by nebulization every 6 (six) hours as needed for wheezing or shortness of breath. 75 mL 12  . aspirin EC 81 MG tablet Take 1 tablet (81 mg total) by mouth daily. 90 tablet 3  . blood glucose meter kit and supplies Dispense based on patient and insurance preference. Use up to four times daily as directed. (FOR ICD-9 250.00, 250.01). 1 each 0  .  calcium-vitamin D (OSCAL WITH D) 500-200 MG-UNIT tablet Take 1 tablet by mouth 2 (two) times daily.    . cetirizine (ZYRTEC) 10 MG tablet Take 10 mg by mouth daily.    . fluticasone (FLONASE) 50 MCG/ACT nasal spray USE 1 OR 2 SPRAYS IN EACH NOSTRIL ONCE DAILY. 16 g 12  . furosemide (LASIX) 20 MG tablet Take 1 tablet (20 mg total) by mouth daily as needed. 90 tablet 3  . glucose blood (ACCU-CHEK AVIVA PLUS) test strip TEST UP TO 4 TIMES DAILY AS DIRECTED 100 strip 13  . Glycopyrrolate-Formoterol (BEVESPI AEROSPHERE) 9-4.8 MCG/ACT AERO Inhale 2 puffs into the lungs 2 (two) times daily. 10.7 g 11  . hydrochlorothiazide (MICROZIDE) 12.5 MG capsule Take 1 capsule (12.5 mg total) by mouth daily. 90 capsule 3  . ketoconazole (NIZORAL) 2 % cream Apply 1 application topically daily. 15 g 0  . ketoconazole (NIZORAL) 2 % shampoo Apply 1 application topically 2 (two) times a week. 120 mL 0  . losartan (COZAAR) 25 MG tablet Take 1 tablet (25 mg total) by mouth 2 (two) times daily. 180 tablet 3  . metFORMIN (GLUCOPHAGE) 500 MG tablet TAKE 2 TABLETS BY MOUTH  TWICE DAILY WITH MEALS 360 tablet 3  . metoprolol succinate (TOPROL-XL) 25 MG 24 hr tablet Take 1 tablet (25 mg total) by mouth daily. 90 tablet 3  . montelukast (SINGULAIR) 10 MG tablet  Take 1 tablet (10 mg total) by mouth at bedtime. 30 tablet 3  . Multiple Vitamin (MULTIVITAMIN) tablet Take 1 tablet by mouth daily.    Marland Kitchen nystatin (MYCOSTATIN) 100000 UNIT/ML suspension Take 5 mLs (500,000 Units total) by mouth 4 (four) times daily. X 7-10 days (Patient taking differently: Take 5 mLs by mouth 4 (four) times daily. X 7-10 days; as needed) 473 mL 0  . nystatin (MYCOSTATIN/NYSTOP) powder Apply 1 application topically 2 (two) times daily. 60 g 11  . nystatin cream (MYCOSTATIN) Apply 1 application topically 2 (two) times daily. (Patient taking differently: Apply 1 application topically 2 (two) times daily. As needed) 30 g 0  . ondansetron (ZOFRAN ODT) 4 MG  disintegrating tablet Take 1 tablet (4 mg total) by mouth every 8 (eight) hours as needed for nausea or vomiting. 20 tablet 0  . OXYGEN Inhale 2 L into the lungs daily. 2 liters at night, 1 liter when doing activities, and no oxygen when at home during the day.    . potassium chloride (KLOR-CON) 10 MEQ tablet Take 1 tablet (10 mEq total) by mouth 2 (two) times daily. 180 tablet 3  . PROVENTIL HFA 108 (90 Base) MCG/ACT inhaler Inhale 2 puffs into the lungs every 6 (six) hours as needed for wheezing or shortness of breath. 18 gram inhaler 3 each 1  . rosuvastatin (CRESTOR) 20 MG tablet Take 1 tablet (20 mg total) by mouth daily. 90 tablet 3  . sodium chloride (OCEAN) 0.65 % SOLN nasal spray Place 2 sprays into both nostrils as needed for congestion. 30 mL 11  . vitamin C (ASCORBIC ACID) 500 MG tablet Take 1,000 mg by mouth daily.     . Vitamin E 180 MG CAPS Take 1 capsule by mouth daily.     No current facility-administered medications on file prior to visit.     ROS see history of present illness  Objective  Physical Exam Vitals:   02/20/21 1028  BP: 130/70  Pulse: 85  Temp: 98.5 F (36.9 C)  SpO2: 93%    BP Readings from Last 3 Encounters:  02/20/21 130/70  02/19/21 (!) 145/80  02/05/21 130/69   Wt Readings from Last 3 Encounters:  02/20/21 249 lb 6.4 oz (113.1 kg)  02/19/21 251 lb 3.2 oz (113.9 kg)  02/05/21 252 lb 3.2 oz (114.4 kg)    Physical Exam Constitutional:      General: She is not in acute distress.    Appearance: She is not diaphoretic.  Neck:   Cardiovascular:     Rate and Rhythm: Normal rate and regular rhythm.     Heart sounds: Normal heart sounds.  Pulmonary:     Effort: Pulmonary effort is normal.     Breath sounds: Normal breath sounds.  Musculoskeletal:     Right lower leg: No edema.     Left lower leg: No edema.  Skin:    General: Skin is warm and dry.  Neurological:     Mental Status: She is alert.      Assessment/Plan: Please see  individual problem list.  Problem List Items Addressed This Visit    DM type 2 (diabetes mellitus, type 2) ([place])    Has been well controlled.  She will continue metformin 1000 mg twice daily.  Check A1c at next office visit.      COPD (chronic obstructive pulmonary disease) with emphysema ([place])    Well-controlled.  She will continue Bevespi.  She has albuterol to use as needed.  Thyroid nodule    The patient is willing to do a follow-up ultrasound today.  Order placed.      Relevant Orders   US THYROID   Allergic rhinitis    Much improved.  She will continue Singulair, Zyrtec, Flonase, and nasal saline spray.      Foot cramps    Discussed adequate water intake.  We will check electrolytes and kidney function.      Relevant Orders   Basic Metabolic Panel (BMET)     Return in about 3 months (around 05/23/2021) for Diabetes with A1c.  This visit occurred during the SARS-CoV-2 public health emergency.  Safety protocols were in place, including screening questions prior to the visit, additional usage of staff PPE, and extensive cleaning of exam room while observing appropriate contact time as indicated for disinfecting solutions.    Martha Rumps, MD [place]

## 2021-02-20 NOTE — Assessment & Plan Note (Signed)
Much improved.  She will continue Singulair, Zyrtec, Flonase, and nasal saline spray.

## 2021-02-20 NOTE — Patient Instructions (Signed)
Nice to see you. We will get lab work today and contact you with the results. Somebody will call you to schedule your thyroid ultrasound.

## 2021-02-20 NOTE — Assessment & Plan Note (Signed)
Well-controlled.  She will continue Bevespi.  She has albuterol to use as needed.

## 2021-02-20 NOTE — Assessment & Plan Note (Signed)
Discussed adequate water intake.  We will check electrolytes and kidney function.

## 2021-02-20 NOTE — Assessment & Plan Note (Signed)
The patient is willing to do a follow-up ultrasound today.  Order placed.

## 2021-02-21 LAB — SURGICAL PATHOLOGY

## 2021-02-22 ENCOUNTER — Telehealth: Payer: Self-pay | Admitting: Family Medicine

## 2021-02-22 NOTE — Telephone Encounter (Signed)
Pt called returning a call about labs  

## 2021-02-22 NOTE — Telephone Encounter (Signed)
See result note.  

## 2021-02-27 ENCOUNTER — Encounter: Payer: BLUE CROSS/BLUE SHIELD | Admitting: Obstetrics and Gynecology

## 2021-03-05 ENCOUNTER — Other Ambulatory Visit: Payer: Self-pay

## 2021-03-05 ENCOUNTER — Ambulatory Visit (INDEPENDENT_AMBULATORY_CARE_PROVIDER_SITE_OTHER): Payer: Medicare Other | Admitting: Obstetrics and Gynecology

## 2021-03-05 ENCOUNTER — Encounter: Payer: Self-pay | Admitting: Obstetrics and Gynecology

## 2021-03-05 VITALS — BP 136/83 | HR 74 | Resp 16 | Ht 65.5 in | Wt 254.3 lb

## 2021-03-05 DIAGNOSIS — Z5189 Encounter for other specified aftercare: Secondary | ICD-10-CM | POA: Diagnosis not present

## 2021-03-05 DIAGNOSIS — L918 Other hypertrophic disorders of the skin: Secondary | ICD-10-CM | POA: Diagnosis not present

## 2021-03-05 NOTE — Progress Notes (Signed)
    GYNECOLOGY PROGRESS NOTE  Subjective:    Patient ID: Martha Woodard, female    DOB: 04-10-1966, 55 y.o.   MRN: [id number]  HPI  Patient is a 55 y.o. [id number] female who presents for 2-week wound check for excision of left labial skin tags.  She reports that she initially was dealing with some pain at the excision sites for the first several days after the procedure.  Attempted to use warm compresses and Epson salts to help.  Also was still having some bleeding for several days after the procedure, however notes this was likely due to her use of aspirin.  Still noting some mild irritation at the biopsy site due to her close rubbing against the area.  The following portions of the patient's history were reviewed and updated as appropriate: allergies, current medications, past family history, past medical history, past social history, past surgical history and problem list.  Review of Systems Pertinent items noted in HPI and remainder of comprehensive ROS otherwise negative.   Objective:   Blood pressure 136/83, pulse 74, resp. rate 16, height 5' 5.5" (1.664 m), weight 254 lb 4.8 oz (115.3 kg), last menstrual period 04/10/2014. General appearance: alert and no distress Pelvic: External genitalia with left labial biopsy sites well-healed.  Residual suture material noted at larger biopsy site.  Suture material removed today.  No   Pathology:  A. VULVA, SKIN TAG:  - Benign fibroepithelial polyp (skin tag)   B. VULVA, SKIN TAG:  - Benign fibroepithelial polyp (skin tag)   Assessment:   1. Visit for wound check   2. Skin tag    Plan:   -Biopsy sites healing well.  Discussed use of aloe vera or lidocaine gel topically to the biopsy sites if needed for local analgesia until completion of healing.  Also patient is able to resume baths as desired. -Reviewed pathology results with patient.  All biopsies benign. -To follow-up as needed.   Martha Maid, MD [facility]

## 2021-03-18 ENCOUNTER — Ambulatory Visit: Payer: Medicare Other | Admitting: Pulmonary Disease

## 2021-03-18 ENCOUNTER — Other Ambulatory Visit: Payer: Self-pay

## 2021-03-18 ENCOUNTER — Encounter: Payer: Self-pay | Admitting: Pulmonary Disease

## 2021-03-18 VITALS — BP 130/70 | HR 77 | Temp 97.8°F | Ht 65.5 in | Wt 256.2 lb

## 2021-03-18 DIAGNOSIS — J440 Chronic obstructive pulmonary disease with acute lower respiratory infection: Secondary | ICD-10-CM

## 2021-03-18 DIAGNOSIS — J9611 Chronic respiratory failure with hypoxia: Secondary | ICD-10-CM

## 2021-03-18 DIAGNOSIS — E662 Morbid (severe) obesity with alveolar hypoventilation: Secondary | ICD-10-CM

## 2021-03-18 DIAGNOSIS — J9612 Chronic respiratory failure with hypercapnia: Secondary | ICD-10-CM | POA: Diagnosis not present

## 2021-03-18 DIAGNOSIS — J0111 Acute recurrent frontal sinusitis: Secondary | ICD-10-CM | POA: Diagnosis not present

## 2021-03-18 MED ORDER — AZITHROMYCIN 250 MG PO TABS: ORAL_TABLET | ORAL | 0 refills | Status: AC

## 2021-03-18 MED ORDER — PREDNISONE 20 MG PO TABS: 20.0000 mg | ORAL_TABLET | Freq: Every day | ORAL | 0 refills | Status: AC

## 2021-03-18 NOTE — Progress Notes (Signed)
Created in error

## 2021-03-18 NOTE — Patient Instructions (Signed)
We have sent a Z-Pak and prednisone to your pharmacy.  We will see you in follow-up in 2 to 3 months time call sooner should any new problems arise.

## 2021-03-18 NOTE — Progress Notes (Addendum)
Subjective:    Patient ID: Martha Woodard, female    DOB: 07/18/1966, 55 y.o.   MRN: [id number]  Chief Complaint  Patient presents with   Follow-up    Feels that she may have a sinus infection, nasal congestion,   PROBLEMS: Chronic hypoxic/hypercarbic respiratory failure Former smoker Moderate/severe COPD [facility] CHF Multiple pulmonary nodules - stable > 2 yrs and deemed benign   DATA: 09/28/2015 2D echo: EF 60 to 65%, grade 1 DD, mild left atrial dilation.  Right-sided pressures normal. 02/28/2020 PFTs:FEV1 1.42L or 49%, FVC or 2.24L 60%, FEV1/FVC 63%, no bronchodilator response.  RV is 122% indicating air trapping.  Diffusion capacity normal.  Consistent with moderate to severe COPD. 04/05/2020 chest x-ray: No active disease.  Consistent with COPD, left base scarring/atelectasis.   INTERVAL HISTORY: Last seen by me on 12/13/2020. No major pulmonary events since then.  She does report increased nasal congestion and yellow/green nasal discharge over the last 24 hours.  No fever.  HPI 55 year old former smoker (2014) who presents for follow-up on COPD.  This is a scheduled visit.  She has been doing well on Bevespi, 2 puffs twice a day.  She cannot tolerate inhaled corticosteroids due to thrush.  Over the last 1 to 2 days she has noted increased sinus pressure with purulent nasal drainage and now she is developing some discoloration in her sputum.  No hemoptysis.  She continues to have issues with dyspnea on exertion but this is at baseline.  She is on oxygen at 1 L/min she is compliant with the oxygen.  She does not endorse any other new complaint.  She does have issues with obesity with obesity hypoventilation aggravating her issues.   Review of Systems A 10 point review of systems was performed and it is as noted above otherwise negative.  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   Seborrheic dermatitis 10/02/2018   Light headedness 06/17/2018   Foot pain 06/17/2018   Allergic rhinitis 11/09/2017   Venous insufficiency 08/07/2017   Chronic respiratory failure ([place]) 11/13/2016   Skin cyst 08/19/2016   Thyroid nodule 11/28/2015   COPD (chronic obstructive pulmonary disease) with emphysema ([place]) 11/13/2015   Chronic diastolic heart failure ([place]) 11/13/2015   Morbid obesity with BMI of 45.0-49.9, adult ([place]) 10/01/2015   Obesity hypoventilation syndrome ([place]) 10/01/2015   Eczema 10/01/2015   Essential hypertension 10/01/2015   Palpitations 06/26/2014   DM type 2 (diabetes mellitus, type 2) ([place]) 06/26/2014   Hyperlipidemia 06/26/2014   Lung nodule seen on imaging study 02/06/2014   Social History   Tobacco Use   Smoking status: Former    Packs/day: 3.00    Years: 33.00    Pack years: 99.00    Types: Cigarettes    Quit date: 07/10/2013    Years since quitting: 7.6   Smokeless tobacco: Never  Substance Use Topics   Alcohol use: No    Alcohol/week: 0.0 standard drinks   Allergies  Allergen Reactions   Augmentin [Amoxicillin-Pot Clavulanate] Itching    Hives Can take PCN and Amoxicillin alone    Current Meds  Medication Sig   Accu-Chek Softclix Lancets lancets TEST UP TO 4 TIMES DAILY   acetaminophen (TYLENOL) 650 MG CR tablet Take 650 mg by mouth every 8 (eight) hours as needed for pain.   albuterol (PROVENTIL) (2.5 MG/3ML)  0.083% nebulizer solution Take 3 mLs (2.5 mg total) by nebulization every 6 (six) hours as needed for wheezing or shortness of breath.   aspirin EC 81 MG tablet Take 1 tablet (81 mg total) by mouth daily.   blood glucose meter kit and supplies Dispense based on patient and insurance preference. Use up to four times daily as directed. (FOR ICD-9 250.00, 250.01).   calcium-vitamin D (OSCAL WITH D) 500-200 MG-UNIT tablet Take 1 tablet by mouth 2 (two) times daily.   cetirizine  (ZYRTEC) 10 MG tablet Take 10 mg by mouth daily.   fluticasone (FLONASE) 50 MCG/ACT nasal spray USE 1 OR 2 SPRAYS IN EACH NOSTRIL ONCE DAILY.   furosemide (LASIX) 20 MG tablet Take 1 tablet (20 mg total) by mouth daily as needed.   glucose blood (ACCU-CHEK AVIVA PLUS) test strip TEST UP TO 4 TIMES DAILY AS DIRECTED   Glycopyrrolate-Formoterol (BEVESPI AEROSPHERE) 9-4.8 MCG/ACT AERO Inhale 2 puffs into the lungs 2 (two) times daily.   hydrochlorothiazide (MICROZIDE) 12.5 MG capsule Take 1 capsule (12.5 mg total) by mouth daily.   ketoconazole (NIZORAL) 2 % cream Apply 1 application topically daily.   ketoconazole (NIZORAL) 2 % shampoo Apply 1 application topically 2 (two) times a week.   losartan (COZAAR) 25 MG tablet Take 1 tablet (25 mg total) by mouth 2 (two) times daily.   metFORMIN (GLUCOPHAGE) 500 MG tablet TAKE 2 TABLETS BY MOUTH  TWICE DAILY WITH MEALS   metoprolol succinate (TOPROL-XL) 25 MG 24 hr tablet Take 1 tablet (25 mg total) by mouth daily.   montelukast (SINGULAIR) 10 MG tablet Take 1 tablet (10 mg total) by mouth at bedtime.   Multiple Vitamin (MULTIVITAMIN) tablet Take 1 tablet by mouth daily.   nystatin (MYCOSTATIN) 100000 UNIT/ML suspension Take 5 mLs (500,000 Units total) by mouth 4 (four) times daily. X 7-10 days (Patient taking differently: Take 5 mLs by mouth 4 (four) times daily. X 7-10 days; as needed)   nystatin (MYCOSTATIN/NYSTOP) powder Apply 1 application topically 2 (two) times daily.   nystatin cream (MYCOSTATIN) Apply 1 application topically 2 (two) times daily.   ondansetron (ZOFRAN ODT) 4 MG disintegrating tablet Take 1 tablet (4 mg total) by mouth every 8 (eight) hours as needed for nausea or vomiting.   OXYGEN Inhale 2 L into the lungs daily. 2 liters at night, 1 liter when doing activities, and no oxygen when at home during the day.   potassium chloride (KLOR-CON) 10 MEQ tablet Take 1 tablet (10 mEq total) by mouth 2 (two) times daily.   PROVENTIL HFA 108 (90  Base) MCG/ACT inhaler Inhale 2 puffs into the lungs every 6 (six) hours as needed for wheezing or shortness of breath. 18 gram inhaler   rosuvastatin (CRESTOR) 20 MG tablet Take 1 tablet (20 mg total) by mouth daily.   sodium chloride (OCEAN) 0.65 % SOLN nasal spray Place 2 sprays into both nostrils as needed for congestion.   vitamin C (ASCORBIC ACID) 500 MG tablet Take 1,000 mg by mouth daily.    Vitamin E 180 MG CAPS Take 1 capsule by mouth daily.   Immunization History  Administered Date(s) Administered   Influenza,inj,Quad PF,6+ Mos 07/02/2015, 08/19/2016, 08/07/2017, 06/16/2018, 06/29/2019, 07/31/2020   Moderna SARS-COV2 Booster Vaccination 11/05/2020   Moderna Sars-Covid-2 Vaccination 03/08/2020, 04/05/2020   Pneumococcal Polysaccharide-23 09/26/2015   Pneumococcal-Unspecified 11/23/2020      Objective:   Physical Exam BP 130/70 (BP Location: Left Arm, Patient Position: Sitting, Cuff Size: Normal)  Pulse 77   Temp 97.8 F (36.6 C) (Temporal)   Ht 5' 5.5" (1.664 m)   Wt 256 lb 3.2 oz (116.2 kg)   LMP 04/10/2014   SpO2 96%   BMI 41.99 kg/m  GENERAL: Obese woman, no acute distress, comfortable with nasal cannula O2.  Fully ambulatory. HEAD: Normocephalic, atraumatic. EYES: Pupils equal, round, reactive to light.  No scleral icterus. MOUTH: Nose/mouth/throat not examined due to masking requirements for COVID 19. NECK: Supple. No thyromegaly. Trachea midline. No JVD.  No adenopathy. PULMONARY: Mildly diminished breath sounds.  No rhonchi, mild end expiratory wheezing particularly in the upper lung zones. CARDIOVASCULAR: S1 and S2. Regular rate and rhythm.  No rubs, murmurs or gallops heard. ABDOMEN: Obese, otherwise benign. MUSCULOSKELETAL: No joint deformity, no clubbing, no edema. NEUROLOGIC: No focal deficit, no gait disturbance, speech is fluent. SKIN: Intact,warm,dry. PSYCH: Mood and behavior normal     Assessment & Plan:     ICD-10-CM   1. COPD with acute lower  respiratory infection ([place])  J44.0    Azithromycin and prednisone x5 days Continue inhalers    2. Acute recurrent frontal sinusitis  J01.11    Azithromycin plus prednisone x5 days Continue nasal hygiene    3. Chronic respiratory failure with hypoxia and hypercapnia ([facility])  J96.11    J96.12    Continue oxygen at 1 L/min     4. Obesity hypoventilation syndrome ([facility])  E66.2    Recommend weight loss     Meds ordered this encounter  Medications   azithromycin (ZITHROMAX Z-PAK) 250 MG tablet    Sig: Take 2 tablets (500 mg) on  Day 1,  followed by 1 tablet (250 mg) once daily on Days 2 through 5.    Dispense:  6 each    Refill:  0   predniSONE (DELTASONE) 20 MG tablet    Sig: Take 1 tablet (20 mg total) by mouth daily with breakfast for 5 days.    Dispense:  5 tablet    Refill:  0       Patient appears to have a mild exacerbation of COPD with lower respiratory infection after sinus symptoms started.  We will treat with azithromycin and prednisone.  She is to let us know if this is not effective.  We will see her in follow-up otherwise in 2 to 3 months time she is to contact us prior to that time should any new difficulties arise or her symptoms fail to improve as discussed above.  Renold Don, MD [facility]   *This note was dictated using voice recognition software/Dragon.  Despite best efforts to proofread, errors can occur which can change the meaning.  Any change was purely unintentional.

## 2021-03-26 ENCOUNTER — Other Ambulatory Visit: Payer: Self-pay | Admitting: Family Medicine

## 2021-03-26 DIAGNOSIS — J309 Allergic rhinitis, unspecified: Secondary | ICD-10-CM

## 2021-03-27 ENCOUNTER — Ambulatory Visit: Payer: Medicare Other

## 2021-04-07 ENCOUNTER — Other Ambulatory Visit: Payer: Self-pay | Admitting: Family Medicine

## 2021-04-18 ENCOUNTER — Telehealth: Payer: Self-pay

## 2021-04-18 NOTE — Telephone Encounter (Signed)
Spoke to pt and she is aware of her pap smear results and stated that she did not want to sign up for mychart.

## 2021-04-18 NOTE — Telephone Encounter (Signed)
Patient called no answer LM that her pap smear results were negative.

## 2021-04-24 ENCOUNTER — Ambulatory Visit (INDEPENDENT_AMBULATORY_CARE_PROVIDER_SITE_OTHER): Payer: Medicare Other | Admitting: Pharmacist

## 2021-04-24 DIAGNOSIS — I5032 Chronic diastolic (congestive) heart failure: Secondary | ICD-10-CM | POA: Diagnosis not present

## 2021-04-24 DIAGNOSIS — I10 Essential (primary) hypertension: Secondary | ICD-10-CM | POA: Diagnosis not present

## 2021-04-24 DIAGNOSIS — J439 Emphysema, unspecified: Secondary | ICD-10-CM

## 2021-04-24 DIAGNOSIS — E782 Mixed hyperlipidemia: Secondary | ICD-10-CM

## 2021-04-24 DIAGNOSIS — E1159 Type 2 diabetes mellitus with other circulatory complications: Secondary | ICD-10-CM | POA: Diagnosis not present

## 2021-04-24 NOTE — Patient Instructions (Signed)
Visit Information  PATIENT GOALS:  Goals Addressed               This Visit's Progress     Patient Stated     Medication Monitoring (pt-stated)        Patient Goals/Self-Care Activities Over the next 90 days, patient will:  - take medications as prescribed check glucose periodically, document, and provide at future appointments check blood pressure periodically, document, and provide at future appointments collaborate with provider on medication access solutions        Patient verbalizes understanding of instructions provided today and agrees to view in [place].   Plan: Telephone follow up appointment with care management team member scheduled for:  ~37  [name], PharmD, [place], [place] Clinical Pharmacist [employer] at [facility] [phone]

## 2021-04-24 NOTE — Chronic Care Management (AMB) (Signed)
Chronic Care Management Pharmacy Note  04/24/2021 Name:  Martha Woodard MRN:  [id number] DOB:  Feb 19, 1966  Subjective: Martha Woodard is an 55 y.o. year old female who is a primary patient of Sonnenberg, Angela Adam, MD.  The CCM team was consulted for assistance with disease management and care coordination needs.    Engaged with patient by telephone for follow up visit in response to provider referral for pharmacy case management and/or care coordination services.   Consent to Services:  The patient was given information about Chronic Care Management services, agreed to services, and gave verbal consent prior to initiation of services.  Please see initial visit note for detailed documentation.   Patient Care Team: Leone Haven, MD as PCP - General (Family Medicine) Robert Bellow, MD (General Surgery) Coral Spikes, DO as Consulting Physician (Family Medicine) Minna Merritts, MD as Consulting Physician (Cardiology) De Hollingshead, RPH-CPP as Pharmacist (Pharmacist)  Recent office visits: 5/25 - PCP - continue current regimen  Recent consult visits: 6/20 - pulmonary - sinus infection; tx w/ azithromycin and prednisone  Hospital visits: None in previous 6 months  Objective:  Lab Results  Component Value Date   CREATININE 0.57 02/20/2021   CREATININE 0.59 08/22/2020   CREATININE 0.58 06/29/2019    Lab Results  Component Value Date   HGBA1C 6.7 (A) 11/23/2020   Last diabetic Eye exam:  Lab Results  Component Value Date/Time   HMDIABEYEEXA No Retinopathy 02/06/2021 12:00 AM    Last diabetic Foot exam: No results found for: HMDIABFOOTEX      Component Value Date/Time   CHOL 99 08/22/2020 1019   TRIG 131.0 08/22/2020 1019   HDL 39.70 08/22/2020 1019   CHOLHDL 2 08/22/2020 1019   VLDL 26.2 08/22/2020 1019   LDLCALC 33 08/22/2020 1019   LDLDIRECT 57.0 07/27/2018 0920    Hepatic Function Latest Ref Rng & Units 09/20/2020 08/22/2020 06/29/2019   Total Protein 6.0 - 8.3 g/dL 6.9 6.7 6.9  Albumin 3.5 - 5.2 g/dL 4.3 4.3 4.2  AST 0 - 37 U/L 37 49(H) 28  ALT 0 - 35 U/L 35 37(H) 33  Alk Phosphatase 39 - 117 U/L 56 49 47  Total Bilirubin 0.2 - 1.2 mg/dL 0.4 0.4 0.4  Bilirubin, Direct 0.0 - 0.3 mg/dL 0.1 - -    Lab Results  Component Value Date/Time   TSH 0.87 08/22/2020 10:19 AM   TSH 1.805 06/11/2015 10:52 AM    CBC Latest Ref Rng & Units 08/22/2020 12/07/2018 12/03/2018  WBC 4.0 - 10.5 K/uL 5.4 4.9 4.6  Hemoglobin 12.0 - 15.0 g/dL 14.2 12.3 14.2  Hematocrit 36.0 - 46.0 % 43.8 37.0 44.1  Platelets 150.0 - 400.0 K/uL 261.0 158.0 144(L)    No results found for: VD25OH  Clinical ASCVD: No  The ASCVD Risk score Mikey Bussing DC Jr., et al., 2013) failed to calculate for the following reasons:   The valid total cholesterol range is 130 to 320 mg/dL      Social History   Tobacco Use  Smoking Status Former   Packs/day: 3.00   Years: 33.00   Pack years: 99.00   Types: Cigarettes   Quit date: 07/10/2013   Years since quitting: 7.7  Smokeless Tobacco Never   BP Readings from Last 3 Encounters:  03/18/21 130/70  03/05/21 136/83  02/20/21 130/70   Pulse Readings from Last 3 Encounters:  03/18/21 77  03/05/21 74  02/20/21 85   Wt Readings from  Last 3 Encounters:  03/18/21 256 lb 3.2 oz (116.2 kg)  03/05/21 254 lb 4.8 oz (115.3 kg)  02/20/21 249 lb 6.4 oz (113.1 kg)    Assessment: Review of patient past medical history, allergies, medications, health status, including review of consultants reports, laboratory and other test data, was performed as part of comprehensive evaluation and provision of chronic care management services.   SDOH:  (Social Determinants of Health) assessments and interventions performed:  SDOH Interventions    Flowsheet Row Most Recent Value  SDOH Interventions   Financial Strain Interventions Other (Comment)  [manufacturer assistance]       CCM Care Plan  Allergies  Allergen Reactions    Augmentin [Amoxicillin-Pot Clavulanate] Itching    Hives Can take PCN and Amoxicillin alone     Medications Reviewed Today     Reviewed by De Hollingshead, RPH-CPP (Pharmacist) on 04/24/21 at 1408  Med List Status: <None>   Medication Order Taking? Sig Documenting Provider Last Dose Status Informant  ACCU-CHEK AVIVA PLUS test strip [id number] Yes TEST UP TO 4 TIMES DAILY AS DIRECTED Leone Haven, MD Taking Active   Accu-Chek Softclix Lancets lancets [id number] Yes TEST UP TO 4 TIMES DAILY Leone Haven, MD Taking Active   acetaminophen (TYLENOL) 650 MG CR tablet [id number] Yes Take 650 mg by mouth every 8 (eight) hours as needed for pain. [provider] Taking Active            Med Note Darnelle Maffucci, Arville Lime   Wed Apr 24, 2021  2:03 PM)    albuterol (PROVENTIL) (2.5 MG/3ML) 0.083% nebulizer solution [id number] Yes Take 3 mLs (2.5 mg total) by nebulization every 6 (six) hours as needed for wheezing or shortness of breath. McLean-Scocuzza, Nino Glow, MD Taking Active   aspirin EC 81 MG tablet [id number] Yes Take 1 tablet (81 mg total) by mouth daily. Coral Spikes, DO Taking Active Self  blood glucose meter kit and supplies [id number] No Dispense based on patient and insurance preference. Use up to four times daily as directed. (FOR ICD-9 250.00, 250.01).  Patient not taking: Reported on 04/24/2021   Coral Spikes, DO Not Taking Active Self  calcium-vitamin D (OSCAL WITH D) 500-200 MG-UNIT tablet [id number] Yes Take 1 tablet by mouth 2 (two) times daily. [provider] Taking Active   cetirizine (ZYRTEC) 10 MG tablet [id number] Yes Take 10 mg by mouth daily. [provider] Taking Active   fluticasone (FLONASE) 50 MCG/ACT nasal spray [id number] Yes USE 1 OR 2 SPRAYS IN EACH NOSTRIL ONCE DAILY. Leone Haven, MD Taking Active Self           Med Note De Hollingshead   Wed Apr 24, 2021  2:04 PM)    furosemide (LASIX) 20 MG tablet [id number] Yes Take 1  tablet (20 mg total) by mouth daily as needed. Minna Merritts, MD Taking Active   Glycopyrrolate-Formoterol (BEVESPI AEROSPHERE) 9-4.8 MCG/ACT Hollie Salk [id number] Yes Inhale 2 puffs into the lungs 2 (two) times daily. Tyler Pita, MD Taking Active   hydrochlorothiazide (MICROZIDE) 12.5 MG capsule [id number] Yes Take 1 capsule (12.5 mg total) by mouth daily. Minna Merritts, MD Taking Active   ketoconazole (NIZORAL) 2 % cream [id number] No Apply 1 application topically daily.  Patient not taking: Reported on 04/24/2021   Leone Haven, MD Not Taking Active   ketoconazole (NIZORAL) 2 % shampoo [id number] No Apply 1 application topically 2 (two) times a week.  Patient not taking:  Reported on 04/24/2021   Leone Haven, MD Not Taking Active   losartan (COZAAR) 25 MG tablet [id number] Yes Take 1 tablet (25 mg total) by mouth 2 (two) times daily. Minna Merritts, MD Taking Active   metFORMIN (GLUCOPHAGE) 500 MG tablet [id number] Yes TAKE 2 TABLETS BY MOUTH  TWICE DAILY WITH MEALS Caryl Bis Angela Adam, MD Taking Active   metoprolol succinate (TOPROL-XL) 25 MG 24 hr tablet [id number] Yes Take 1 tablet (25 mg total) by mouth daily. Minna Merritts, MD Taking Active   montelukast (SINGULAIR) 10 MG tablet [id number] Yes TAKE 1 TABLET BY MOUTH AT  BEDTIME Leone Haven, MD Taking Active   Multiple Vitamin (MULTIVITAMIN) tablet [id number] Yes Take 1 tablet by mouth daily. [provider] Taking Active Self  nystatin (MYCOSTATIN) 100000 UNIT/ML suspension [id number] No Take 5 mLs (500,000 Units total) by mouth 4 (four) times daily. X 7-10 days  Patient not taking: Reported on 04/24/2021   McLean-Scocuzza, Nino Glow, MD Not Taking Active   nystatin (MYCOSTATIN/NYSTOP) powder [id number] Yes Apply 1 application topically 2 (two) times daily. [name], MD Taking Active   nystatin cream (MYCOSTATIN) [id number] No Apply 1 application topically 2 (two) times daily.  Patient not taking: Reported  on 04/24/2021   Leone Haven, MD Not Taking Active            Med Note Coralee Pesa Nov 05, 2020 10:46 AM)    ondansetron (ZOFRAN ODT) 4 MG disintegrating tablet [id number] No Take 1 tablet (4 mg total) by mouth every 8 (eight) hours as needed for nausea or vomiting.  Patient not taking: Reported on 04/24/2021   Earleen Newport, MD Not Taking Active   OXYGEN [id number] Yes Inhale 2 L into the lungs daily. 2 liters at night, 1 liter when doing activities, and no oxygen when at home during the day. [provider] Taking Active Self  potassium chloride (KLOR-CON) 10 MEQ tablet [id number] Yes Take 1 tablet (10 mEq total) by mouth 2 (two) times daily. Minna Merritts, MD Taking Active   PROVENTIL HFA 108 [phone] Base) MCG/ACT inhaler [id number] Yes Inhale 2 puffs into the lungs every 6 (six) hours as needed for wheezing or shortness of breath. 18 gram inhaler McLean-Scocuzza, Nino Glow, MD Taking Active   rosuvastatin (CRESTOR) 20 MG tablet [id number] Yes Take 1 tablet (20 mg total) by mouth daily. Minna Merritts, MD Taking Active   sodium chloride (OCEAN) 0.65 % SOLN nasal spray [id number] Yes Place 2 sprays into both nostrils as needed for congestion. McLean-Scocuzza, Nino Glow, MD Taking Active   vitamin C (ASCORBIC ACID) 500 MG tablet [id number] Yes Take 1,000 mg by mouth daily.  [provider] Taking Active   Vitamin E 180 MG CAPS [id number] Yes Take 1 capsule by mouth daily. 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   Seborrheic dermatitis 10/02/2018   Light headedness 06/17/2018   Foot pain 06/17/2018   Allergic rhinitis 11/09/2017   Venous insufficiency 08/07/2017   Chronic respiratory failure ([place]) 11/13/2016   Skin cyst 08/19/2016   Thyroid nodule 11/28/2015    COPD (chronic obstructive pulmonary disease) with emphysema ([place]) 11/13/2015   Chronic diastolic heart failure ([place]) 11/13/2015   Morbid  obesity with BMI of 45.0-49.9, adult ([place]) 10/01/2015   Obesity hypoventilation syndrome ([place]) 10/01/2015   Eczema 10/01/2015   Essential hypertension 10/01/2015   Palpitations 06/26/2014   DM type 2 (diabetes mellitus, type 2) ([place]) 06/26/2014   Hyperlipidemia 06/26/2014   Lung nodule seen on imaging study 02/06/2014    Immunization History  Administered Date(s) Administered   Influenza,inj,Quad PF,6+ Mos 07/02/2015, 08/19/2016, 08/07/2017, 06/16/2018, 06/29/2019, 07/31/2020   Moderna SARS-COV2 Booster Vaccination 11/05/2020   Moderna Sars-Covid-2 Vaccination 03/08/2020, 04/05/2020   Pneumococcal Polysaccharide-23 09/26/2015   Pneumococcal-Unspecified 11/23/2020    Conditions to be addressed/monitored: CHF, HTN, HLD, COPD, and DMII  Care Plan : Medication Management  Updates made by De Hollingshead, RPH-CPP since 04/24/2021 12:00 AM     Problem: Diabetes, COPD, HTN, CHF      Goal: Disease Progression Prevention   Start Date: 10/31/2020  This Visit's Progress: On track  Recent Progress: On track  Priority: High  Note:   Current Barriers:  Unable to independently afford treatment regimen Complex patient with multiple comorbidities at high risk for exacerbation   Pharmacist Clinical Goal(s):  Over the next 90 days, patient will verbalize ability to afford treatment regimen Over the next 90 days, patient will achieve adherence to monitoring guidelines and medication adherence to achieve therapeutic efficacy through collaboration with PharmD and provider.   Interventions: 1:1 collaboration with Leone Haven, MD regarding development and update of comprehensive plan of care as evidenced by provider attestation and co-signature Inter-disciplinary care team collaboration (see longitudinal plan of care) Comprehensive medication review  performed; medication list updated in electronic medical record  Health Maintenance: DM eye exam completed  OBGYN visit completed. Up to date.   Diabetes: Controlled per A1c; current treatment: metformin 1000 mg BID  Current dietary patterns: breakfast: banana or other fruits, sometimes boiled eggs; 2-3 pm lunch: sandwich, vegetables, chicken or pork chops; 6 pm- supper; similar to lunch, often leftovers; drinks: 1 glass of soda per day, water, black coffee, sweet tea (makes at home); snacks/desserts: fruit, but occasionally Current physical activity: limited by heat/humidity (breathing limits this) Recommended to continue current regimen at this time Counseled on GLP1 agonists, including mechanism of action, side effects, and benefits. Does have thyroid nodules, [place] ordered by PCP but she notes she cancelled because the copay is $115. She reports that her brother and sister had thyroid "problems", sounds like both are on thyroid supplements, but does not remember a history of cancer. No history of pancreatitis or gallbladder disease. Will discuss w/ PCP. Benefit of GLP1 for assistance with portion size reduction and weight management stands to be beneficial. She would qualify for Ozempic patient assistance.  Hypertension, CHF: Controlled; current treatment: losartan 25 mg BID, HCTZ 12.5 mg daily, metoprolol succinate 25 mg daily, furosemide 20 mg PRN - though requiring daily; potassium 10 mEq BID, now taking a magnesium supplement daily; follows w/ Dr. Rockey Situ. Due for follow up in 10/2021 Current home readings: not checking because last office visit was well controlled.  Recommended to continue current regimen at this time along with cardiology collaboration  Hyperlipidemia and ASCVD risk reduction: Controlled per last lipid panel; current treatment: rosuvastatin 20 mg daily;  Antiplatelet regimen: aspirin 81 mg daily Recommended to continue current regimen at this time  Chronic Obstructive  Pulmonary Disease; Chronic Sinus Congestion often leading to COPD exacerbation: Controlled COPD, sinus symptoms; current treatment: Bevespi 9/4.8 mcg 2 puffs BID, albuterol HFA PRN, albuterol nebulizer PRN; chronic O2 use. Allergy regimen: cetirizine 10  mg daily, fluticasone intranasal both nostrils BID, montelukast 10 mg daily, nasal saline spray continues to be beneficial in clearing out sinuses to reduce frequency of sinus infections; follows w/ Dr. Elta Guadeloupe to utilize Trelegy, Judithann Sauger- use of inhaled corticosteroid resulted in mouth irritation, even if she rinsed her mouth out well after use.  Approved for Bevespi assistance through 09/28/21 GOLD Classification: D Encouraged to continue current regimen at this time along wiht continued collaboration with pulmonology.   Supplements: Vitamin C, Vitamin D + Calcium, Vitamin E, zinc  Patient Goals/Self-Care Activities Over the next 90 days, patient will:  - take medications as prescribed check glucose periodically, document, and provide at future appointments check blood pressure periodically, document, and provide at future appointments collaborate with provider on medication access solutions  Follow Up Plan: Telephone follow up appointment with care management team member scheduled for: ~ 12 weeks      Medication Assistance:  Bevespi obtained through [place] medication assistance program.  Enrollment ends 09/28/21  Patient's preferred pharmacy is:  [facility]  ([place]) - [place], [address] [place] [address] Phone: [phone] Fax: [place], [address]. [place] [address] Phone: [phone] Fax: [phone]   Follow Up:  Patient agrees to Care Plan and Follow-up.  Plan: Telephone follow up appointment with care management team member scheduled for:  ~37  [name], PharmD, [place],  [place] Clinical Pharmacist [employer] at [facility] [phone]

## 2021-05-04 DIAGNOSIS — N3001 Acute cystitis with hematuria: Secondary | ICD-10-CM | POA: Diagnosis not present

## 2021-05-21 ENCOUNTER — Telehealth: Payer: Self-pay | Admitting: Family Medicine

## 2021-05-21 NOTE — Telephone Encounter (Signed)
PT called to advise that she was seen for a bladder infection at [facility] on [address] in [place], [place]. They were suppose to fax over the info in regards to that visit to [place] and wanted to see if we had gotten it or not. She also wanted to see if Dr.Sonnenberg wanted her to have another urine test done before Friday visit. PT advise she was seen by Alfonzo Feller at [facility] either the end of last month or beginning of this month.

## 2021-05-21 NOTE — Telephone Encounter (Signed)
I have not seen a note regarding this.  Can you look in our records stack at some point to see if we receive this?  We can plan on rechecking a urine during her follow-up visit.

## 2021-05-22 NOTE — Telephone Encounter (Signed)
Noted.  Reviewed.  There is not a significant amount of helpful information in the note.  We will see how she is doing at follow-up.

## 2021-05-23 NOTE — Telephone Encounter (Signed)
Patient was seen at [facility] 8/6 for a bladder infection and she is calling to see if she can give a urine sample before her appointment tomorrow.Please call her at [phone].

## 2021-05-23 NOTE — Telephone Encounter (Signed)
We can discuss it during her visit.  We can collect the urine at the time of her visit and run an in office dipstick.

## 2021-05-23 NOTE — Telephone Encounter (Signed)
I called the patient and informed her that the urine would be collected at her visit next week and she understood. Martha Woodard,cma

## 2021-05-24 ENCOUNTER — Telehealth: Payer: Self-pay | Admitting: Family Medicine

## 2021-05-24 ENCOUNTER — Other Ambulatory Visit: Payer: Self-pay

## 2021-05-24 ENCOUNTER — Ambulatory Visit (INDEPENDENT_AMBULATORY_CARE_PROVIDER_SITE_OTHER): Payer: Medicare Other | Admitting: Family Medicine

## 2021-05-24 DIAGNOSIS — I5032 Chronic diastolic (congestive) heart failure: Secondary | ICD-10-CM

## 2021-05-24 DIAGNOSIS — R232 Flushing: Secondary | ICD-10-CM | POA: Diagnosis not present

## 2021-05-24 DIAGNOSIS — I10 Essential (primary) hypertension: Secondary | ICD-10-CM | POA: Diagnosis not present

## 2021-05-24 DIAGNOSIS — R300 Dysuria: Secondary | ICD-10-CM | POA: Insufficient documentation

## 2021-05-24 DIAGNOSIS — E1159 Type 2 diabetes mellitus with other circulatory complications: Secondary | ICD-10-CM | POA: Diagnosis not present

## 2021-05-24 DIAGNOSIS — R309 Painful micturition, unspecified: Secondary | ICD-10-CM | POA: Insufficient documentation

## 2021-05-24 LAB — CBC WITH DIFFERENTIAL/PLATELET
Basophils Absolute: 0 10*3/uL (ref 0.0–0.1)
Basophils Relative: 0.5 % (ref 0.0–3.0)
Eosinophils Absolute: 0.2 10*3/uL (ref 0.0–0.7)
Eosinophils Relative: 3.1 % (ref 0.0–5.0)
HCT: 44 % (ref 36.0–46.0)
Hemoglobin: 14.3 g/dL (ref 12.0–15.0)
Lymphocytes Relative: 29.3 % (ref 12.0–46.0)
Lymphs Abs: 1.8 10*3/uL (ref 0.7–4.0)
MCHC: 32.4 g/dL (ref 30.0–36.0)
MCV: 84.6 fl (ref 78.0–100.0)
Monocytes Absolute: 0.6 10*3/uL (ref 0.1–1.0)
Monocytes Relative: 9.2 % (ref 3.0–12.0)
Neutro Abs: 3.5 10*3/uL (ref 1.4–7.7)
Neutrophils Relative %: 57.9 % (ref 43.0–77.0)
Platelets: 260 10*3/uL (ref 150.0–400.0)
RBC: 5.2 Mil/uL — ABNORMAL HIGH (ref 3.87–5.11)
RDW: 15.5 % (ref 11.5–15.5)
WBC: 6 10*3/uL (ref 4.0–10.5)

## 2021-05-24 LAB — POCT URINALYSIS DIPSTICK
Bilirubin, UA: NEGATIVE
Blood, UA: NEGATIVE
Glucose, UA: NEGATIVE
Ketones, UA: NEGATIVE
Leukocytes, UA: NEGATIVE
Nitrite, UA: NEGATIVE
Protein, UA: NEGATIVE
Spec Grav, UA: 1.01 (ref 1.010–1.025)
Urobilinogen, UA: 0.2 E.U./dL
pH, UA: 7 (ref 5.0–8.0)

## 2021-05-24 LAB — HEMOGLOBIN A1C: Hgb A1c MFr Bld: 6.9 % — ABNORMAL HIGH (ref 4.6–6.5)

## 2021-05-24 LAB — URINALYSIS, MICROSCOPIC ONLY

## 2021-05-24 LAB — TSH: TSH: 0.9 u[IU]/mL (ref 0.35–5.50)

## 2021-05-24 MED ORDER — CEFDINIR 300 MG PO CAPS: 300.0000 mg | ORAL_CAPSULE | Freq: Two times a day (BID) | ORAL | 0 refills | Status: DC

## 2021-05-24 NOTE — Assessment & Plan Note (Signed)
Suspect these are postmenopausal hot flashes though we will check lab work to evaluate for other causes.

## 2021-05-24 NOTE — Telephone Encounter (Signed)
I sent cefdinir to her pharmacy.

## 2021-05-24 NOTE — Assessment & Plan Note (Signed)
Weight is relatively stable.  Discussed monitoring.  I encouraged her to let us or cardiology if she develops weight gain of more than 2 pounds overnight or more than 5 pounds in a week.

## 2021-05-24 NOTE — Telephone Encounter (Signed)
Pt.notified

## 2021-05-24 NOTE — Patient Instructions (Addendum)
Nice to see you. We are going to check your urine today.  Please contact us with the name of the antibiotic you are treated with previously. We will get blood work today and contact you with the results as well.

## 2021-05-24 NOTE — Progress Notes (Signed)
[name], MD Phone: [phone]  Martha Woodard is a 55 y.o. female who presents today for f/u.  DIABETES Disease Monitoring: Blood Sugar ranges-112-140 Polyuria/phagia/dipsia- no      Optho- UTD Medications: Compliance- taking metformin Hypoglycemic symptoms- no Notes no personal or family history of thyroid cancer, parathyroid cancer, or adrenal gland cancer  HYPERTENSION/HFpEF Disease Monitoring Home BP Monitoring typically 120/80 Chest pain- no    Dyspnea- no change to chronic dyspnea from pulmonary issues Medications Compliance-  taking losartan, metoprolol.  Edema- no Does note occasional issues with weight gain of 2 pounds overnight.  This typically occurs if she has excessively salty foods. BMET    Component Value Date/Time   NA 141 02/20/2021 1056   K 4.6 02/20/2021 1056   CL 97 02/20/2021 1056   CO2 37 (H) 02/20/2021 1056   GLUCOSE 85 02/20/2021 1056   BUN 11 02/20/2021 1056   BUN 8 02/10/2014 1112   CREATININE 0.57 02/20/2021 1056   CALCIUM 10.2 02/20/2021 1056   GFRNONAA >60 12/03/2018 1503   GFRAA >60 12/03/2018 1503   UTI: Patient was treated at urgent care for this at the beginning of the month.  She does not remember the name of the antibiotic that she was treated with.  She notes recurrence of symptoms over the last week or so. Dysuria- yes  Frequency- yes   Urgency- yes   Hematuria- yes, prior to treatment at urgent care, no recurrence    Abd pain- no   Vaginal d/c- no  Hot flashes: Patient is postmenopausal.  Her last menstrual cycle was 4 years ago.  She gets hot flashes and some sweating with this.  She notes decreased energy level as well.    Social History   Tobacco Use  Smoking Status Former   Packs/day: 3.00   Years: 33.00   Pack years: 99.00   Types: Cigarettes   Quit date: 07/10/2013   Years since quitting: 7.8  Smokeless Tobacco Never    Current Outpatient Medications on File Prior to Visit  Medication Sig Dispense  Refill   ACCU-CHEK AVIVA PLUS test strip TEST UP TO 4 TIMES DAILY AS DIRECTED 200 strip 6   Accu-Chek Softclix Lancets lancets TEST UP TO 4 TIMES DAILY 400 each 3   acetaminophen (TYLENOL) 650 MG CR tablet Take 650 mg by mouth every 8 (eight) hours as needed for pain.     albuterol (PROVENTIL) (2.5 MG/3ML) 0.083% nebulizer solution Take 3 mLs (2.5 mg total) by nebulization every 6 (six) hours as needed for wheezing or shortness of breath. 75 mL 12   aspirin EC 81 MG tablet Take 1 tablet (81 mg total) by mouth daily. 90 tablet 3   blood glucose meter kit and supplies Dispense based on patient and insurance preference. Use up to four times daily as directed. (FOR ICD-9 250.00, 250.01). 1 each 0   calcium-vitamin D (OSCAL WITH D) 500-200 MG-UNIT tablet Take 1 tablet by mouth 2 (two) times daily.     cetirizine (ZYRTEC) 10 MG tablet Take 10 mg by mouth daily.     fluticasone (FLONASE) 50 MCG/ACT nasal spray USE 1 OR 2 SPRAYS IN EACH NOSTRIL ONCE DAILY. 16 g 12   furosemide (LASIX) 20 MG tablet Take 1 tablet (20 mg total) by mouth daily as needed. 90 tablet 3   Glycopyrrolate-Formoterol (BEVESPI AEROSPHERE) 9-4.8 MCG/ACT AERO Inhale 2 puffs into the lungs 2 (two) times daily. 10.7 g 11   hydrochlorothiazide (MICROZIDE) 12.5 MG capsule Take  1 capsule (12.5 mg total) by mouth daily. 90 capsule 3   ketoconazole (NIZORAL) 2 % cream Apply 1 application topically daily. 15 g 0   ketoconazole (NIZORAL) 2 % shampoo Apply 1 application topically 2 (two) times a week. 120 mL 0   losartan (COZAAR) 25 MG tablet Take 1 tablet (25 mg total) by mouth 2 (two) times daily. 180 tablet 3   magnesium oxide (MAG-OX) 400 MG tablet Take 400 mg by mouth daily.     metFORMIN (GLUCOPHAGE) 500 MG tablet TAKE 2 TABLETS BY MOUTH  TWICE DAILY WITH MEALS 360 tablet 3   metoprolol succinate (TOPROL-XL) 25 MG 24 hr tablet Take 1 tablet (25 mg total) by mouth daily. 90 tablet 3   montelukast (SINGULAIR) 10 MG tablet TAKE 1 TABLET BY  MOUTH AT  BEDTIME 30 tablet 11   Multiple Vitamin (MULTIVITAMIN) tablet Take 1 tablet by mouth daily.     nystatin (MYCOSTATIN) 100000 UNIT/ML suspension Take 5 mLs (500,000 Units total) by mouth 4 (four) times daily. X 7-10 days 473 mL 0   nystatin (MYCOSTATIN/NYSTOP) powder Apply 1 application topically 2 (two) times daily. 60 g 11   nystatin cream (MYCOSTATIN) Apply 1 application topically 2 (two) times daily. 30 g 0   ondansetron (ZOFRAN ODT) 4 MG disintegrating tablet Take 1 tablet (4 mg total) by mouth every 8 (eight) hours as needed for nausea or vomiting. 20 tablet 0   OXYGEN Inhale 2 L into the lungs daily. 2 liters at night, 1 liter when doing activities, and no oxygen when at home during the day.     potassium chloride (KLOR-CON) 10 MEQ tablet Take 1 tablet (10 mEq total) by mouth 2 (two) times daily. 180 tablet 3   PROVENTIL HFA 108 (90 Base) MCG/ACT inhaler Inhale 2 puffs into the lungs every 6 (six) hours as needed for wheezing or shortness of breath. 18 gram inhaler 3 each 1   rosuvastatin (CRESTOR) 20 MG tablet Take 1 tablet (20 mg total) by mouth daily. 90 tablet 3   sodium chloride (OCEAN) 0.65 % SOLN nasal spray Place 2 sprays into both nostrils as needed for congestion. 30 mL 11   vitamin C (ASCORBIC ACID) 500 MG tablet Take 1,000 mg by mouth daily.      Vitamin E 180 MG CAPS Take 1 capsule by mouth daily.     No current facility-administered medications on file prior to visit.     ROS see history of present illness  Objective  Physical Exam Vitals:   05/24/21 1101  BP: 120/80  Pulse: 85  Temp: 98.6 F (37 C)  SpO2: 94%    BP Readings from Last 3 Encounters:  05/24/21 120/80  03/18/21 130/70  03/05/21 136/83   Wt Readings from Last 3 Encounters:  05/24/21 251 lb 9.6 oz (114.1 kg)  03/18/21 256 lb 3.2 oz (116.2 kg)  03/05/21 254 lb 4.8 oz (115.3 kg)    Physical Exam Constitutional:      General: She is not in acute distress.    Appearance: She is not  diaphoretic.  Cardiovascular:     Rate and Rhythm: Normal rate and regular rhythm.     Heart sounds: Normal heart sounds.  Pulmonary:     Effort: Pulmonary effort is normal.     Breath sounds: Normal breath sounds.  Abdominal:     General: Bowel sounds are normal. There is no distension.     Palpations: Abdomen is soft.     Tenderness:  no abdominal tenderness There is no guarding or rebound.  Musculoskeletal:     Right lower leg: No edema.     Left lower leg: No edema.  Skin:    General: Skin is warm and dry.  Neurological:     Mental Status: She is alert.     Assessment/Plan: Please see individual problem list.  Problem List Items Addressed This Visit     Chronic diastolic heart failure ([place])    Weight is relatively stable.  Discussed monitoring.  I encouraged her to let us or cardiology if she develops weight gain of more than 2 pounds overnight or more than 5 pounds in a week.      DM type 2 (diabetes mellitus, type 2) ([facility])    Check A1c.  She will continue metformin 500 mg twice daily.      Relevant Orders   HgB A1c   Dysuria    Likely has a UTI.  We will check a urinalysis today.  She will look at the antibiotic bottle when she gets home and let us know what she was treated with previously and then I can determine what to treat her with now.      Relevant Orders   POCT Urinalysis Dipstick (Completed)   Urine Culture   Urine Microscopic   Essential hypertension    Well-controlled.  She will continue losartan 25 mg twice daily and metoprolol 25 mg daily.      Hot flashes    Suspect these are postmenopausal hot flashes though we will check lab work to evaluate for other causes.      Relevant Orders   TSH   CBC w/Diff    Return in about 6 weeks (around 07/05/2021) for Nurse visit for flu shot, 3 months with PCP.  This visit occurred during the SARS-CoV-2 public health emergency.  Safety protocols were in place, including screening questions prior to the  visit, additional usage of staff PPE, and extensive cleaning of exam room while observing appropriate contact time as indicated for disinfecting solutions.    [name], MD [facility]

## 2021-05-24 NOTE — Telephone Encounter (Signed)
Patient calling back in about medication  Sulfamethoxazole-trime 800-160 mg tablets to take one tablet by mouth twice daily until finished. Patient states she finished this a week ago.   Pain medication prescribed was Phenazopyridine 100 mg tablets taking 2 tablets three times daily as needed for pain. Patient has finished this as well.    States she is still having urinary symptoms and wondering if anything will be called in over the weekend?

## 2021-05-24 NOTE — Assessment & Plan Note (Signed)
Well-controlled.  She will continue losartan 25 mg twice daily and metoprolol 25 mg daily.

## 2021-05-24 NOTE — Telephone Encounter (Signed)
Patient calling back in about medication   Sulfamethoxazole-trime 800-160 mg tablets to take one tablet by mouth twice daily until finished. Patient states she finished this a week ago.    Pain medication prescribed was Phenazopyridine 100 mg tablets taking 2 tablets three times daily as needed for pain. Patient has finished this as well.      States she is still having urinary symptoms and wondering if anything will be called in over the weekend?   Martha Woodard,cma

## 2021-05-24 NOTE — Assessment & Plan Note (Signed)
Check A1c.  She will continue metformin 500 mg twice daily.

## 2021-05-24 NOTE — Assessment & Plan Note (Signed)
Likely has a UTI.  We will check a urinalysis today.  She will look at the antibiotic bottle when she gets home and let us know what she was treated with previously and then I can determine what to treat her with now.

## 2021-05-25 LAB — URINE CULTURE
MICRO NUMBER:: 12296956
Result:: NO GROWTH
SPECIMEN QUALITY:: ADEQUATE

## 2021-05-29 ENCOUNTER — Other Ambulatory Visit: Payer: Self-pay | Admitting: Family Medicine

## 2021-05-29 DIAGNOSIS — R232 Flushing: Secondary | ICD-10-CM

## 2021-05-29 DIAGNOSIS — R300 Dysuria: Secondary | ICD-10-CM

## 2021-05-29 MED ORDER — GABAPENTIN 100 MG PO CAPS: 100.0000 mg | ORAL_CAPSULE | Freq: Three times a day (TID) | ORAL | 3 refills | Status: DC

## 2021-05-30 ENCOUNTER — Ambulatory Visit: Payer: Medicare Other | Admitting: Pharmacist

## 2021-05-30 DIAGNOSIS — E1159 Type 2 diabetes mellitus with other circulatory complications: Secondary | ICD-10-CM

## 2021-05-30 DIAGNOSIS — R232 Flushing: Secondary | ICD-10-CM

## 2021-05-30 DIAGNOSIS — I5032 Chronic diastolic (congestive) heart failure: Secondary | ICD-10-CM

## 2021-05-30 DIAGNOSIS — J439 Emphysema, unspecified: Secondary | ICD-10-CM

## 2021-05-30 DIAGNOSIS — E782 Mixed hyperlipidemia: Secondary | ICD-10-CM

## 2021-05-30 MED ORDER — BEVESPI AEROSPHERE 9-4.8 MCG/ACT IN AERO: 2.0000 | INHALATION_SPRAY | Freq: Two times a day (BID) | RESPIRATORY_TRACT | 3 refills | Status: DC

## 2021-05-30 NOTE — Chronic Care Management (AMB) (Signed)
Chronic Care Management Pharmacy Note  05/30/2021 Name:  SAMHITHA ROSEN MRN:  [id number] DOB:  1966-09-06  Subjective: Martha Woodard is an 55 y.o. year old female who is a primary patient of Sonnenberg, Angela Adam, MD.  The CCM team was consulted for assistance with disease management and care coordination needs.    Engaged with patient by telephone for medication access in response to provider referral for pharmacy case management and/or care coordination services.   Consent to Services:  The patient was given information about Chronic Care Management services, agreed to services, and gave verbal consent prior to initiation of services.  Please see initial visit note for detailed documentation.   Patient Care Team: Leone Haven, MD as PCP - General (Family Medicine) Bary Castilla, Forest Gleason, MD (General Surgery) Coral Spikes, DO as Consulting Physician (Family Medicine) Minna Merritts, MD as Consulting Physician (Cardiology) De Hollingshead, RPH-CPP as Pharmacist (Pharmacist)   Objective:  Lab Results  Component Value Date   CREATININE 0.57 02/20/2021   CREATININE 0.59 08/22/2020   CREATININE 0.58 06/29/2019    Lab Results  Component Value Date   HGBA1C 6.9 (H) 05/24/2021   Last diabetic Eye exam:  Lab Results  Component Value Date/Time   HMDIABEYEEXA No Retinopathy 02/06/2021 12:00 AM    Last diabetic Foot exam: No results found for: HMDIABFOOTEX      Component Value Date/Time   CHOL 99 08/22/2020 1019   TRIG 131.0 08/22/2020 1019   HDL 39.70 08/22/2020 1019   CHOLHDL 2 08/22/2020 1019   VLDL 26.2 08/22/2020 1019   LDLCALC 33 08/22/2020 1019   LDLDIRECT 57.0 07/27/2018 0920    Hepatic Function Latest Ref Rng & Units 09/20/2020 08/22/2020 06/29/2019  Total Protein 6.0 - 8.3 g/dL 6.9 6.7 6.9  Albumin 3.5 - 5.2 g/dL 4.3 4.3 4.2  AST 0 - 37 U/L 37 49(H) 28  ALT 0 - 35 U/L 35 37(H) 33  Alk Phosphatase 39 - 117 U/L 56 49 47  Total Bilirubin 0.2 - 1.2  mg/dL 0.4 0.4 0.4  Bilirubin, Direct 0.0 - 0.3 mg/dL 0.1 - -    Lab Results  Component Value Date/Time   TSH 0.90 05/24/2021 11:36 AM   TSH 0.87 08/22/2020 10:19 AM    CBC Latest Ref Rng & Units 05/24/2021 08/22/2020 12/07/2018  WBC 4.0 - 10.5 K/uL 6.0 5.4 4.9  Hemoglobin 12.0 - 15.0 g/dL 14.3 14.2 12.3  Hematocrit 36.0 - 46.0 % 44.0 43.8 37.0  Platelets 150.0 - 400.0 K/uL 260.0 261.0 158.0    No results found for: VD25OH  Clinical ASCVD: No  The ASCVD Risk score Mikey Bussing DC Jr., et al., 2013) failed to calculate for the following reasons:   The valid total cholesterol range is 130 to 320 mg/dL      Social History   Tobacco Use  Smoking Status Former   Packs/day: 3.00   Years: 33.00   Pack years: 99.00   Types: Cigarettes   Quit date: 07/10/2013   Years since quitting: 7.8  Smokeless Tobacco Never   BP Readings from Last 3 Encounters:  05/24/21 120/80  03/18/21 130/70  03/05/21 136/83   Pulse Readings from Last 3 Encounters:  05/24/21 85  03/18/21 77  03/05/21 74   Wt Readings from Last 3 Encounters:  05/24/21 251 lb 9.6 oz (114.1 kg)  03/18/21 256 lb 3.2 oz (116.2 kg)  03/05/21 254 lb 4.8 oz (115.3 kg)    Assessment: Review of patient  past medical history, allergies, medications, health status, including review of consultants reports, laboratory and other test data, was performed as part of comprehensive evaluation and provision of chronic care management services.   SDOH:  (Social Determinants of Health) assessments and interventions performed:  SDOH Interventions    Flowsheet Row Most Recent Value  SDOH Interventions   Financial Strain Interventions Other (Comment)  [manufacturer assistance]       CCM Care Plan  Allergies  Allergen Reactions   Augmentin [Amoxicillin-Pot Clavulanate] Itching    Hives Can take PCN and Amoxicillin alone     Medications Reviewed Today     Reviewed by Gordy Councilman, CMA (Certified Medical Assistant) on 05/24/21  at 1102  Med List Status: <None>   Medication Order Taking? Sig Documenting Provider Last Dose Status Informant  ACCU-CHEK AVIVA PLUS test strip [id number] Yes TEST UP TO 4 TIMES DAILY AS DIRECTED Leone Haven, MD Taking Active   Accu-Chek Softclix Lancets lancets [id number] Yes TEST UP TO 4 TIMES DAILY Leone Haven, MD Taking Active   acetaminophen (TYLENOL) 650 MG CR tablet [id number] Yes Take 650 mg by mouth every 8 (eight) hours as needed for pain. [provider] Taking Active            Med Note Darnelle Maffucci, Arville Lime   Wed Apr 24, 2021  2:03 PM)    albuterol (PROVENTIL) (2.5 MG/3ML) 0.083% nebulizer solution [id number] Yes Take 3 mLs (2.5 mg total) by nebulization every 6 (six) hours as needed for wheezing or shortness of breath. McLean-Scocuzza, Nino Glow, MD Taking Active   aspirin EC 81 MG tablet [id number] Yes Take 1 tablet (81 mg total) by mouth daily. Coral Spikes, DO Taking Active Self  blood glucose meter kit and supplies [id number] Yes Dispense based on patient and insurance preference. Use up to four times daily as directed. (FOR ICD-9 250.00, 250.01). Coral Spikes, DO Taking Active   calcium-vitamin D (OSCAL WITH D) 500-200 MG-UNIT tablet [id number] Yes Take 1 tablet by mouth 2 (two) times daily. [provider] Taking Active   cetirizine (ZYRTEC) 10 MG tablet [id number] Yes Take 10 mg by mouth daily. [provider] Taking Active   fluticasone (FLONASE) 50 MCG/ACT nasal spray [id number] Yes USE 1 OR 2 SPRAYS IN EACH NOSTRIL ONCE DAILY. Leone Haven, MD Taking Active Self           Med Note De Hollingshead   Wed Apr 24, 2021  2:04 PM)    furosemide (LASIX) 20 MG tablet [id number] Yes Take 1 tablet (20 mg total) by mouth daily as needed. Minna Merritts, MD Taking Active   Glycopyrrolate-Formoterol (BEVESPI AEROSPHERE) 9-4.8 MCG/ACT Hollie Salk [id number] Yes Inhale 2 puffs into the lungs 2 (two) times daily. Tyler Pita, MD Taking Active    hydrochlorothiazide (MICROZIDE) 12.5 MG capsule [id number] Yes Take 1 capsule (12.5 mg total) by mouth daily. Minna Merritts, MD Taking Active   ketoconazole (NIZORAL) 2 % cream [id number] Yes Apply 1 application topically daily. Leone Haven, MD Taking Active   ketoconazole (NIZORAL) 2 % shampoo [id number] Yes Apply 1 application topically 2 (two) times a week. Leone Haven, MD Taking Active   losartan (COZAAR) 25 MG tablet [id number] Yes Take 1 tablet (25 mg total) by mouth 2 (two) times daily. Minna Merritts, MD Taking Active   magnesium oxide (MAG-OX) 400 MG tablet [id number] Yes Take 400 mg by mouth daily. [provider] Taking Active  metFORMIN (GLUCOPHAGE) 500 MG tablet [id number] Yes TAKE 2 TABLETS BY MOUTH  TWICE DAILY WITH MEALS Leone Haven, MD Taking Active   metoprolol succinate (TOPROL-XL) 25 MG 24 hr tablet [id number] Yes Take 1 tablet (25 mg total) by mouth daily. Minna Merritts, MD Taking Active   montelukast (SINGULAIR) 10 MG tablet [id number] Yes TAKE 1 TABLET BY MOUTH AT  BEDTIME Leone Haven, MD Taking Active   Multiple Vitamin (MULTIVITAMIN) tablet [id number] Yes Take 1 tablet by mouth daily. [provider] Taking Active Self  nystatin (MYCOSTATIN) 100000 UNIT/ML suspension [id number] Yes Take 5 mLs (500,000 Units total) by mouth 4 (four) times daily. X 7-10 days McLean-Scocuzza, Nino Glow, MD Taking Active   nystatin (MYCOSTATIN/NYSTOP) powder [id number] Yes Apply 1 application topically 2 (two) times daily. [name], MD Taking Active   nystatin cream (MYCOSTATIN) [id number] Yes Apply 1 application topically 2 (two) times daily. Leone Haven, MD Taking Active            Med Note Coralee Pesa Nov 05, 2020 10:46 AM)    ondansetron (ZOFRAN ODT) 4 MG disintegrating tablet [id number] Yes Take 1 tablet (4 mg total) by mouth every 8 (eight) hours as needed for nausea or vomiting. Earleen Newport, MD Taking Active    OXYGEN [id number] Yes Inhale 2 L into the lungs daily. 2 liters at night, 1 liter when doing activities, and no oxygen when at home during the day. [provider] Taking Active Self  potassium chloride (KLOR-CON) 10 MEQ tablet [id number] Yes Take 1 tablet (10 mEq total) by mouth 2 (two) times daily. Minna Merritts, MD Taking Active   PROVENTIL HFA 108 [phone] Base) MCG/ACT inhaler [id number] Yes Inhale 2 puffs into the lungs every 6 (six) hours as needed for wheezing or shortness of breath. 18 gram inhaler McLean-Scocuzza, Nino Glow, MD Taking Active   rosuvastatin (CRESTOR) 20 MG tablet [id number] Yes Take 1 tablet (20 mg total) by mouth daily. Minna Merritts, MD Taking Active   sodium chloride (OCEAN) 0.65 % SOLN nasal spray [id number] Yes Place 2 sprays into both nostrils as needed for congestion. McLean-Scocuzza, Nino Glow, MD Taking Active   vitamin C (ASCORBIC ACID) 500 MG tablet [id number] Yes Take 1,000 mg by mouth daily.  [provider] Taking Active   Vitamin E 180 MG CAPS [id number] Yes Take 1 capsule by mouth daily. 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/13/2018   Seborrheic dermatitis 10/02/2018   Light headedness 06/17/2018   Foot pain 06/17/2018   Allergic rhinitis 11/09/2017   Venous insufficiency 08/07/2017   Chronic respiratory failure ([place]) 11/13/2016   Skin cyst 08/19/2016   Thyroid nodule 11/28/2015   COPD (chronic obstructive pulmonary disease) with emphysema ([place]) 11/13/2015   Chronic diastolic heart failure ([place]) 11/13/2015   Morbid obesity with BMI of 45.0-49.9, adult ([place]) 10/01/2015   Obesity hypoventilation syndrome ([place]) 10/01/2015   Eczema 10/01/2015   Essential hypertension 10/01/2015   Palpitations  06/26/2014   DM type 2 (diabetes mellitus, type 2) ([place]) 06/26/2014   Hyperlipidemia 06/26/2014   Lung nodule seen on imaging study 02/06/2014    Immunization History  Administered Date(s) Administered  Influenza,inj,Quad PF,6+ Mos 07/02/2015, 08/19/2016, 08/07/2017, 06/16/2018, 06/29/2019, 07/31/2020   Moderna SARS-COV2 Booster Vaccination 11/05/2020   Moderna Sars-Covid-2 Vaccination 03/08/2020, 04/05/2020   Pneumococcal Polysaccharide-23 09/26/2015   Pneumococcal-Unspecified 11/23/2020    Conditions to be addressed/monitored: CHF, HTN, and DMII  Care Plan : Medication Management  Updates made by De Hollingshead, RPH-CPP since 05/30/2021 12:00 AM     Problem: Diabetes, COPD, HTN, CHF      Goal: Disease Progression Prevention   Start Date: 10/31/2020  This Visit's Progress: On track  Recent Progress: On track  Priority: High  Note:   Current Barriers:  Unable to independently afford treatment regimen Complex patient with multiple comorbidities at high risk for exacerbation   Pharmacist Clinical Goal(s):  Over the next 90 days, patient will verbalize ability to afford treatment regimen Over the next 90 days, patient will achieve adherence to monitoring guidelines and medication adherence to achieve therapeutic efficacy through collaboration with PharmD and provider.   Interventions: 1:1 collaboration with Leone Haven, MD regarding development and update of comprehensive plan of care as evidenced by provider attestation and co-signature Inter-disciplinary care team collaboration (see longitudinal plan of care) Comprehensive medication review performed; medication list updated in electronic medical record  Diabetes: Controlled per A1c; current treatment: metformin 1000 mg BID  Current dietary patterns: breakfast: banana or other fruits, sometimes boiled eggs; 2-3 pm lunch: sandwich, vegetables, chicken or pork chops; 6 pm- supper; similar to lunch, often leftovers;  drinks: 1 glass of soda per day, water, black coffee, sweet tea (makes at home); snacks/desserts: fruit, but occasionally Current physical activity: limited by heat/humidity (breathing limits this) Thyroid nodules previously identified, but patient canceled [place] due to copay. Discussed that due to question of family history thyroid cancer, we would need to have thyroid US completed to better gauge whether GLP1 would be appropriate therapy for her. Encouraged her to re-consider thyroid US and let us know if she would like to reschedule it Previously recommended to continue current regimen at this time  Hypertension, CHF: Controlled; current treatment: losartan 25 mg BID, HCTZ 12.5 mg daily, metoprolol succinate 25 mg daily, furosemide 20 mg PRN - though requiring daily; potassium 10 mEq BID, now taking a magnesium supplement daily; follows w/ Dr. Rockey Situ. Due for follow up in 10/2021 Previously recommended to continue current regimen at this time along with cardiology collaboration  Hyperlipidemia and ASCVD risk reduction: Controlled per last lipid panel; current treatment: rosuvastatin 20 mg daily;  Antiplatelet regimen: aspirin 81 mg daily Previously recommended to continue current regimen at this time  Chronic Obstructive Pulmonary Disease; Chronic Sinus Congestion often leading to COPD exacerbation: Controlled COPD, sinus symptoms; current treatment: Bevespi 9/4.8 mcg 2 puffs BID, albuterol HFA PRN, albuterol nebulizer PRN; chronic O2 use. Allergy regimen: cetirizine 10 mg daily, fluticasone intranasal both nostrils BID, montelukast 10 mg daily, nasal saline spray continues to be beneficial in clearing out sinuses to reduce frequency of sinus infections; follows w/ Dr. Elta Guadeloupe to utilize Trelegy, Judithann Sauger- use of inhaled corticosteroid resulted in mouth irritation, even if she rinsed her mouth out well after use.  Approved for Bevespi assistance through 09/28/21 GOLD Classification: D Calls  today to note that she needs a new script for [employer] sent to [employer] patient assistance. Sent today. Patient will call [place] in the morning to process refill request.   Hot Flashes: Reports she picked up gabapentin today, but read about it and saw that it was used to treat seizures so was  confused. Reviewed multiple uses of gabapentin. Reviewed potential side effects of sedation, lower extremity edema. Patient plans to just try in the evenings first and increase as tolerated.   Supplements: Vitamin C, Vitamin D + Calcium, Vitamin E, zinc  Patient Goals/Self-Care Activities Over the next 90 days, patient will:  - take medications as prescribed check glucose periodically, document, and provide at future appointments check blood pressure periodically, document, and provide at future appointments collaborate with provider on medication access solutions  Follow Up Plan: Telephone follow up appointment with care management team member scheduled for: ~ 6 weeks as previously scheduled      Medication Assistance:  Bevespi obtained through [employer] medication assistance program.  Enrollment ends 09/28/21  Patient's preferred pharmacy is:  [facility]  ([place], [place] - [address] [facility] [facility] [address] [place] [address] Phone: [phone] Fax: [place], [place]. [place] [address] Phone: [phone] Fax: [place], [place]. [place] [address] Phone: [phone] Fax: [phone]  Follow Up:  Patient agrees to Care Plan and Follow-up.  Plan: Telephone follow up appointment with care management team member scheduled for:  ~ 6 weeks as previously scheduled  [name], PharmD, [place], [place] Clinical Pharmacist [employer] at [employer] [phone]

## 2021-05-30 NOTE — Progress Notes (Signed)
I called and spoke with the patient and informed her that the provider wanted to check her urine and she is scheduled to do that.  She has stopped the antibiotic.  Gael Delude,cma

## 2021-05-30 NOTE — Patient Instructions (Signed)
Visit Information  PATIENT GOALS:  Goals Addressed               This Visit's Progress     Patient Stated     Medication Monitoring (pt-stated)        Patient Goals/Self-Care Activities Over the next 90 days, patient will:  - take medications as prescribed check glucose periodically, document, and provide at future appointments check blood pressure periodically, document, and provide at future appointments collaborate with provider on medication access solutions         Patient verbalizes understanding of instructions provided today and agrees to view in [place].   Plan: Telephone follow up appointment with care management team member scheduled for:  ~ 6 weeks as previously scheduled  [name], PharmD, [place], [place] Clinical Pharmacist [employer] at [employer] [phone]

## 2021-05-31 ENCOUNTER — Ambulatory Visit (INDEPENDENT_AMBULATORY_CARE_PROVIDER_SITE_OTHER): Payer: Medicare Other | Admitting: Pharmacist

## 2021-05-31 DIAGNOSIS — J439 Emphysema, unspecified: Secondary | ICD-10-CM | POA: Diagnosis not present

## 2021-05-31 DIAGNOSIS — E1159 Type 2 diabetes mellitus with other circulatory complications: Secondary | ICD-10-CM

## 2021-05-31 DIAGNOSIS — E782 Mixed hyperlipidemia: Secondary | ICD-10-CM | POA: Diagnosis not present

## 2021-05-31 DIAGNOSIS — I5032 Chronic diastolic (congestive) heart failure: Secondary | ICD-10-CM

## 2021-05-31 NOTE — Patient Instructions (Signed)
Visit Information  PATIENT GOALS:  Goals Addressed               This Visit's Progress     Patient Stated     Medication Monitoring (pt-stated)        Patient Goals/Self-Care Activities Over the next 90 days, patient will:  - take medications as prescribed check glucose periodically, document, and provide at future appointments check blood pressure periodically, document, and provide at future appointments collaborate with provider on medication access solutions        Patient verbalizes understanding of instructions provided today and agrees to view in [place].   Plan: Telephone follow up appointment with care management team member scheduled for:  ~6 weeks as previously scheduled  [name], PharmD, [place], [place] Clinical Pharmacist [employer] at [facility] [phone]

## 2021-05-31 NOTE — Chronic Care Management (AMB) (Signed)
Chronic Care Management Pharmacy Note  05/31/2021 Name:  Martha Woodard MRN:  [id number] DOB:  [birth year]   Subjective: Martha Woodard is an 55 y.o. year old female who is a primary patient of Sonnenberg, Angela Adam, MD.  The CCM team was consulted for assistance with disease management and care coordination needs.    Care coordination for  medication access  in response to provider referral for pharmacy case management and/or care coordination services.   Consent to Services:  The patient was given information about Chronic Care Management services, agreed to services, and gave verbal consent prior to initiation of services.  Please see initial visit note for detailed documentation.   Patient Care Team: Leone Haven, MD as PCP - General (Family Medicine) Bary Castilla, Forest Gleason, MD (General Surgery) Coral Spikes, DO as Consulting Physician (Family Medicine) Minna Merritts, MD as Consulting Physician (Cardiology) De Hollingshead, RPH-CPP as Pharmacist (Pharmacist)   Objective:  Lab Results  Component Value Date   CREATININE 0.57 02/20/2021   CREATININE 0.59 08/22/2020   CREATININE 0.58 06/29/2019    Lab Results  Component Value Date   HGBA1C 6.9 (H) 05/24/2021   Last diabetic Eye exam:  Lab Results  Component Value Date/Time   HMDIABEYEEXA No Retinopathy 02/06/2021 12:00 AM    Last diabetic Foot exam: No results found for: HMDIABFOOTEX      Component Value Date/Time   CHOL 99 08/22/2020 1019   TRIG 131.0 08/22/2020 1019   HDL 39.70 08/22/2020 1019   CHOLHDL 2 08/22/2020 1019   VLDL 26.2 08/22/2020 1019   LDLCALC 33 08/22/2020 1019   LDLDIRECT 57.0 07/27/2018 0920    Hepatic Function Latest Ref Rng & Units 09/20/2020 08/22/2020 06/29/2019  Total Protein 6.0 - 8.3 g/dL 6.9 6.7 6.9  Albumin 3.5 - 5.2 g/dL 4.3 4.3 4.2  AST 0 - 37 U/L 37 49(H) 28  ALT 0 - 35 U/L 35 37(H) 33  Alk Phosphatase 39 - 117 U/L 56 49 47  Total Bilirubin 0.2 - 1.2 mg/dL 0.4 0.4  0.4  Bilirubin, Direct 0.0 - 0.3 mg/dL 0.1 - -    Lab Results  Component Value Date/Time   TSH 0.90 05/24/2021 11:36 AM   TSH 0.87 08/22/2020 10:19 AM    CBC Latest Ref Rng & Units 05/24/2021 08/22/2020 12/07/2018  WBC 4.0 - 10.5 K/uL 6.0 5.4 4.9  Hemoglobin 12.0 - 15.0 g/dL 14.3 14.2 12.3  Hematocrit 36.0 - 46.0 % 44.0 43.8 37.0  Platelets 150.0 - 400.0 K/uL 260.0 261.0 158.0    No results found for: VD25OH  Clinical ASCVD: No  The ASCVD Risk score Mikey Bussing DC Jr., et al., 2013) failed to calculate for the following reasons:   The valid total cholesterol range is 130 to 320 mg/dL      Social History   Tobacco Use  Smoking Status Former   Packs/day: 3.00   Years: 33.00   Pack years: 99.00   Types: Cigarettes   Quit date: 07/10/2013   Years since quitting: 7.8  Smokeless Tobacco Never   BP Readings from Last 3 Encounters:  05/24/21 120/80  03/18/21 130/70  03/05/21 136/83   Pulse Readings from Last 3 Encounters:  05/24/21 85  03/18/21 77  03/05/21 74   Wt Readings from Last 3 Encounters:  05/24/21 251 lb 9.6 oz (114.1 kg)  03/18/21 256 lb 3.2 oz (116.2 kg)  03/05/21 254 lb 4.8 oz (115.3 kg)    Assessment: Review of patient  past medical history, allergies, medications, health status, including review of consultants reports, laboratory and other test data, was performed as part of comprehensive evaluation and provision of chronic care management services.   SDOH:  (Social Determinants of Health) assessments and interventions performed:  SDOH Interventions    Flowsheet Row Most Recent Value  SDOH Interventions   Financial Strain Interventions Intervention Not Indicated       CCM Care Plan  Allergies  Allergen Reactions   Augmentin [Amoxicillin-Pot Clavulanate] Itching    Hives Can take PCN and Amoxicillin alone     Medications Reviewed Today     Reviewed by Gordy Councilman, CMA (Certified Medical Assistant) on 05/24/21 at [place] List Status:  <None>   Medication Order Taking? Sig Documenting Provider Last Dose Status Informant  ACCU-CHEK AVIVA PLUS test strip [id number] Yes TEST UP TO 4 TIMES DAILY AS DIRECTED Leone Haven, MD Taking Active   Accu-Chek Softclix Lancets lancets [id number] Yes TEST UP TO 4 TIMES DAILY Leone Haven, MD Taking Active   acetaminophen (TYLENOL) 650 MG CR tablet [id number] Yes Take 650 mg by mouth every 8 (eight) hours as needed for pain. [provider] Taking Active            Med Note Darnelle Maffucci, Arville Lime   Wed Apr 24, 2021  2:03 PM)    albuterol (PROVENTIL) (2.5 MG/3ML) 0.083% nebulizer solution [id number] Yes Take 3 mLs (2.5 mg total) by nebulization every 6 (six) hours as needed for wheezing or shortness of breath. McLean-Scocuzza, Nino Glow, MD Taking Active   aspirin EC 81 MG tablet [id number] Yes Take 1 tablet (81 mg total) by mouth daily. Coral Spikes, DO Taking Active Self  blood glucose meter kit and supplies [id number] Yes Dispense based on patient and insurance preference. Use up to four times daily as directed. (FOR ICD-9 250.00, 250.01). Coral Spikes, DO Taking Active   calcium-vitamin D (OSCAL WITH D) 500-200 MG-UNIT tablet [id number] Yes Take 1 tablet by mouth 2 (two) times daily. [provider] Taking Active   cetirizine (ZYRTEC) 10 MG tablet [id number] Yes Take 10 mg by mouth daily. [provider] Taking Active   fluticasone (FLONASE) 50 MCG/ACT nasal spray [id number] Yes USE 1 OR 2 SPRAYS IN EACH NOSTRIL ONCE DAILY. Leone Haven, MD Taking Active Self           Med Note De Hollingshead   Wed Apr 24, 2021  2:04 PM)    furosemide (LASIX) 20 MG tablet [id number] Yes Take 1 tablet (20 mg total) by mouth daily as needed. Minna Merritts, MD Taking Active   Glycopyrrolate-Formoterol (BEVESPI AEROSPHERE) 9-4.8 MCG/ACT Hollie Salk [id number] Yes Inhale 2 puffs into the lungs 2 (two) times daily. Tyler Pita, MD Taking Active   hydrochlorothiazide  (MICROZIDE) 12.5 MG capsule [id number] Yes Take 1 capsule (12.5 mg total) by mouth daily. Minna Merritts, MD Taking Active   ketoconazole (NIZORAL) 2 % cream [id number] Yes Apply 1 application topically daily. Leone Haven, MD Taking Active   ketoconazole (NIZORAL) 2 % shampoo [id number] Yes Apply 1 application topically 2 (two) times a week. Leone Haven, MD Taking Active   losartan (COZAAR) 25 MG tablet [id number] Yes Take 1 tablet (25 mg total) by mouth 2 (two) times daily. Minna Merritts, MD Taking Active   magnesium oxide (MAG-OX) 400 MG tablet [id number] Yes Take 400 mg by mouth daily. [provider] Taking Active   metFORMIN (GLUCOPHAGE)  500 MG tablet [id number] Yes TAKE 2 TABLETS BY MOUTH  TWICE DAILY WITH MEALS Leone Haven, MD Taking Active   metoprolol succinate (TOPROL-XL) 25 MG 24 hr tablet [id number] Yes Take 1 tablet (25 mg total) by mouth daily. Minna Merritts, MD Taking Active   montelukast (SINGULAIR) 10 MG tablet [id number] Yes TAKE 1 TABLET BY MOUTH AT  BEDTIME Leone Haven, MD Taking Active   Multiple Vitamin (MULTIVITAMIN) tablet [id number] Yes Take 1 tablet by mouth daily. [provider] Taking Active Self  nystatin (MYCOSTATIN) 100000 UNIT/ML suspension [id number] Yes Take 5 mLs (500,000 Units total) by mouth 4 (four) times daily. X 7-10 days McLean-Scocuzza, Nino Glow, MD Taking Active   nystatin (MYCOSTATIN/NYSTOP) powder [id number] Yes Apply 1 application topically 2 (two) times daily. [name], MD Taking Active   nystatin cream (MYCOSTATIN) [id number] Yes Apply 1 application topically 2 (two) times daily. Leone Haven, MD Taking Active            Med Note Coralee Pesa Nov 05, 2020 10:46 AM)    ondansetron (ZOFRAN ODT) 4 MG disintegrating tablet [id number] Yes Take 1 tablet (4 mg total) by mouth every 8 (eight) hours as needed for nausea or vomiting. Earleen Newport, MD Taking Active   OXYGEN [id number] Yes Inhale  2 L into the lungs daily. 2 liters at night, 1 liter when doing activities, and no oxygen when at home during the day. [provider] Taking Active Self  potassium chloride (KLOR-CON) 10 MEQ tablet [id number] Yes Take 1 tablet (10 mEq total) by mouth 2 (two) times daily. Minna Merritts, MD Taking Active   PROVENTIL HFA 108 [phone] Base) MCG/ACT inhaler [id number] Yes Inhale 2 puffs into the lungs every 6 (six) hours as needed for wheezing or shortness of breath. 18 gram inhaler McLean-Scocuzza, Nino Glow, MD Taking Active   rosuvastatin (CRESTOR) 20 MG tablet [id number] Yes Take 1 tablet (20 mg total) by mouth daily. Minna Merritts, MD Taking Active   sodium chloride (OCEAN) 0.65 % SOLN nasal spray [id number] Yes Place 2 sprays into both nostrils as needed for congestion. McLean-Scocuzza, Nino Glow, MD Taking Active   vitamin C (ASCORBIC ACID) 500 MG tablet [id number] Yes Take 1,000 mg by mouth daily.  [provider] Taking Active   Vitamin E 180 MG CAPS [id number] Yes Take 1 capsule by mouth daily. 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/13/2018   Seborrheic dermatitis 10/02/2018   Light headedness 06/17/2018   Foot pain 06/17/2018   Allergic rhinitis 11/09/2017   Venous insufficiency 08/07/2017   Chronic respiratory failure ([place]) 11/13/2016   Skin cyst 08/19/2016   Thyroid nodule 11/28/2015   COPD (chronic obstructive pulmonary disease) with emphysema ([place]) 11/13/2015   Chronic diastolic heart failure ([place]) 11/13/2015   Morbid obesity with BMI of 45.0-49.9, adult ([place]) 10/01/2015   Obesity hypoventilation syndrome ([place]) 10/01/2015   Eczema 10/01/2015   Essential hypertension 10/01/2015   Palpitations 06/26/2014   DM type 2 (diabetes  mellitus, type 2) ([place]) 06/26/2014   Hyperlipidemia 06/26/2014   Lung nodule seen on imaging study 02/06/2014    Immunization History  Administered Date(s) Administered   Influenza,inj,Quad  PF,6+ Mos 07/02/2015, 08/19/2016, 08/07/2017, 06/16/2018, 06/29/2019, 07/31/2020   Moderna SARS-COV2 Booster Vaccination 11/05/2020   Moderna Sars-Covid-2 Vaccination 03/08/2020, 04/05/2020   Pneumococcal Polysaccharide-23 09/26/2015   Pneumococcal-Unspecified 11/23/2020    Conditions to be addressed/monitored: CHF, HTN, COPD, and DMII  Care Plan : Medication Management  Updates made by De Hollingshead, RPH-CPP since 05/31/2021 12:00 AM     Problem: Diabetes, COPD, HTN, CHF      Goal: Disease Progression Prevention   Start Date: 10/31/2020  This Visit's Progress: On track  Recent Progress: On track  Priority: High  Note:   Current Barriers:  Unable to independently afford treatment regimen Complex patient with multiple comorbidities at high risk for exacerbation   Pharmacist Clinical Goal(s):  Over the next 90 days, patient will verbalize ability to afford treatment regimen Over the next 90 days, patient will achieve adherence to monitoring guidelines and medication adherence to achieve therapeutic efficacy through collaboration with PharmD and provider.   Interventions: 1:1 collaboration with Leone Haven, MD regarding development and update of comprehensive plan of care as evidenced by provider attestation and co-signature Inter-disciplinary care team collaboration (see longitudinal plan of care) Comprehensive medication review performed; medication list updated in electronic medical record  Diabetes: Controlled per A1c; current treatment: metformin 1000 mg BID  Current dietary patterns: breakfast: banana or other fruits, sometimes boiled eggs; 2-3 pm lunch: sandwich, vegetables, chicken or pork chops; 6 pm- supper; similar to lunch, often leftovers; drinks: 1 glass of soda per  day, water, black coffee, sweet tea (makes at home); snacks/desserts: fruit, but occasionally Current physical activity: limited by heat/humidity (breathing limits this) Thyroid nodules previously identified, but patient canceled [place] due to copay. Discussed that due to question of family history thyroid cancer, we would need to have thyroid US completed to better gauge whether GLP1 would be appropriate therapy for her. Encouraged her to re-consider thyroid US and let us know if she would like to reschedule it Previously recommended to continue current regimen at this time  Hypertension, CHF: Controlled; current treatment: losartan 25 mg BID, HCTZ 12.5 mg daily, metoprolol succinate 25 mg daily, furosemide 20 mg PRN - though requiring daily; potassium 10 mEq BID, now taking a magnesium supplement daily; follows w/ Dr. Rockey Situ. Due for follow up in 10/2021 Previously recommended to continue current regimen at this time along with cardiology collaboration  Hyperlipidemia and ASCVD risk reduction: Controlled per last lipid panel; current treatment: rosuvastatin 20 mg daily;  Antiplatelet regimen: aspirin 81 mg daily Previously recommended to continue current regimen at this time  Chronic Obstructive Pulmonary Disease; Chronic Sinus Congestion often leading to COPD exacerbation: Controlled COPD, sinus symptoms; current treatment: Bevespi 9/4.8 mcg 2 puffs BID, albuterol HFA PRN, albuterol nebulizer PRN; chronic O2 use. Allergy regimen: cetirizine 10 mg daily, fluticasone intranasal both nostrils BID, montelukast 10 mg daily, nasal saline spray continues to be beneficial in clearing out sinuses to reduce frequency of sinus infections; follows w/ Dr. Elta Guadeloupe to utilize Trelegy, Judithann Sauger- use of inhaled corticosteroid resulted in mouth irritation, even if she rinsed her mouth out well after use.  Approved for Bevespi assistance through 09/28/21 GOLD Classification: D Left message today noting that she  was unable to get through to [facility] to refill her Bevespi due to a long hold time. Collaborated w/ [facility]. [place] experiencing long hold times due to the phone lines being closed for a week. Called patient back, encouraged to call [place] back and wait on hold as  needed.   Hot Flashes: Recently started therapy; gabapentin 100 mg TID PRN Previously discussed medication, side effects  Supplements: Vitamin C, Vitamin D + Calcium, Vitamin E, zinc  Patient Goals/Self-Care Activities Over the next 90 days, patient will:  - take medications as prescribed check glucose periodically, document, and provide at future appointments check blood pressure periodically, document, and provide at future appointments collaborate with provider on medication access solutions  Follow Up Plan: Telephone follow up appointment with care management team member scheduled for: ~ 6 weeks as previously scheduled      Medication Assistance:  Bevespi obtained through [employer] medication assistance program.  Enrollment ends 09/28/21  Patient's preferred pharmacy is:  [facility]  ([place], [place] - [address] [place] [address] Phone: [phone] Fax: [place], [place] [place] [address] Phone: [phone] Fax: [place], [place] [address] [place] [address] Phone: [phone] Fax: [phone]   Follow Up:  Patient agrees to Care Plan and Follow-up.  Plan: Telephone follow up appointment with care management team member scheduled for:  ~6 weeks as previously scheduled  [name], PharmD, [place], [place] Clinical Pharmacist [employer] at [facility] [phone]

## 2021-06-05 ENCOUNTER — Other Ambulatory Visit: Payer: Self-pay

## 2021-06-05 ENCOUNTER — Other Ambulatory Visit (INDEPENDENT_AMBULATORY_CARE_PROVIDER_SITE_OTHER): Payer: Medicare Other

## 2021-06-05 DIAGNOSIS — R300 Dysuria: Secondary | ICD-10-CM

## 2021-06-05 LAB — POCT URINALYSIS DIPSTICK
Bilirubin, UA: NEGATIVE
Blood, UA: NEGATIVE
Glucose, UA: NEGATIVE
Ketones, UA: NEGATIVE
Leukocytes, UA: NEGATIVE
Nitrite, UA: NEGATIVE
Protein, UA: NEGATIVE
Spec Grav, UA: <=1.005 — AB (ref 1.010–1.025)
Urobilinogen, UA: 0.2 E.U./dL
pH, UA: 5.5 (ref 5.0–8.0)

## 2021-06-06 ENCOUNTER — Telehealth: Payer: Self-pay | Admitting: Pulmonary Disease

## 2021-06-06 DIAGNOSIS — J439 Emphysema, unspecified: Secondary | ICD-10-CM

## 2021-06-06 MED ORDER — BEVESPI AEROSPHERE 9-4.8 MCG/ACT IN AERO
2.0000 | INHALATION_SPRAY | Freq: Two times a day (BID) | RESPIRATORY_TRACT | 11 refills | Status: DC
Start: 2021-06-06 — End: 2023-07-07

## 2021-06-06 NOTE — Telephone Encounter (Signed)
Rx has been printed and placed in Dr. Domingo Dimes folder for signature.

## 2021-06-06 NOTE — Telephone Encounter (Signed)
Patient called in on my phone asking for a Rx for her Martha Woodard to be sent to [facility] fax # [phone]. She is on her last refill and needs 90 day refill sent to them. She also wanted someone to call her back when this has been done

## 2021-06-07 NOTE — Telephone Encounter (Signed)
Signed Rx has been faxed to [facility].  Patient is aware and voiced her understanding.  Nothing further needed.

## 2021-06-15 DIAGNOSIS — J449 Chronic obstructive pulmonary disease, unspecified: Secondary | ICD-10-CM | POA: Diagnosis not present

## 2021-07-05 ENCOUNTER — Ambulatory Visit (INDEPENDENT_AMBULATORY_CARE_PROVIDER_SITE_OTHER): Payer: Medicare Other

## 2021-07-05 ENCOUNTER — Other Ambulatory Visit: Payer: Self-pay

## 2021-07-05 DIAGNOSIS — Z23 Encounter for immunization: Secondary | ICD-10-CM

## 2021-07-15 DIAGNOSIS — J449 Chronic obstructive pulmonary disease, unspecified: Secondary | ICD-10-CM | POA: Diagnosis not present

## 2021-07-31 ENCOUNTER — Ambulatory Visit (INDEPENDENT_AMBULATORY_CARE_PROVIDER_SITE_OTHER): Payer: Medicare Other | Admitting: Pharmacist

## 2021-07-31 DIAGNOSIS — I5032 Chronic diastolic (congestive) heart failure: Secondary | ICD-10-CM

## 2021-07-31 DIAGNOSIS — E1159 Type 2 diabetes mellitus with other circulatory complications: Secondary | ICD-10-CM

## 2021-07-31 DIAGNOSIS — I10 Essential (primary) hypertension: Secondary | ICD-10-CM

## 2021-07-31 DIAGNOSIS — E782 Mixed hyperlipidemia: Secondary | ICD-10-CM

## 2021-07-31 NOTE — Chronic Care Management (AMB) (Signed)
Chronic Care Management CCM Pharmacy Note  07/31/2021 Name:  Martha Woodard MRN:  [id number] DOB:  Jun 21, 1966  Subjective: Martha Woodard is an 55 y.o. year old female who is a primary patient of Sonnenberg, Angela Adam, MD.  The CCM team was consulted for assistance with disease management and care coordination needs.    Engaged with patient by telephone for follow up visit for pharmacy case management and/or care coordination services.   Objective:  Medications Reviewed Today     Reviewed by De Hollingshead, RPH-CPP (Pharmacist) on 07/31/21 at [phone]  Med List Status: <None>   Medication Order Taking? Sig Documenting Provider Last Dose Status Informant  ACCU-CHEK AVIVA PLUS test strip [id number] Yes TEST UP TO 4 TIMES DAILY AS DIRECTED Leone Haven, MD Taking Active   Accu-Chek Softclix Lancets lancets [id number] Yes TEST UP TO 4 TIMES DAILY Leone Haven, MD Taking Active   acetaminophen (TYLENOL) 650 MG CR tablet [id number] No Take 650 mg by mouth every 8 (eight) hours as needed for pain.  Patient not taking: Reported on 07/31/2021   [provider] Not Taking Active            Med Note ([place]   Wed Apr 24, 2021  2:03 PM)    albuterol (PROVENTIL) (2.5 MG/3ML) 0.083% nebulizer solution [id number] Yes Take 3 mLs (2.5 mg total) by nebulization every 6 (six) hours as needed for wheezing or shortness of breath. McLean-Scocuzza, Nino Glow, MD Taking Active   aspirin EC 81 MG tablet [id number] Yes Take 1 tablet (81 mg total) by mouth daily. Coral Spikes, DO Taking Active Self  blood glucose meter kit and supplies [id number]  Dispense based on patient and insurance preference. Use up to four times daily as directed. (FOR ICD-9 250.00, 250.01). Cook, [place] G, DO  Active   calcium-vitamin D (OSCAL WITH D) 500-200 MG-UNIT tablet [id number] Yes Take 1 tablet by mouth 2 (two) times daily. [provider] Taking Active   cetirizine (ZYRTEC) 10 MG tablet [id number]  Yes Take 10 mg by mouth daily. [provider] Taking Active   fluticasone (FLONASE) 50 MCG/ACT nasal spray [id number] Yes USE 1 OR 2 SPRAYS IN EACH NOSTRIL ONCE DAILY. Leone Haven, MD Taking Active Self           Med Note De Hollingshead   Wed Apr 24, 2021  2:04 PM)    furosemide (LASIX) 20 MG tablet [id number] Yes Take 1 tablet (20 mg total) by mouth daily as needed. Minna Merritts, MD Taking Active   gabapentin (NEURONTIN) 100 MG capsule [id number] Yes Take 1 capsule (100 mg total) by mouth 3 (three) times daily. Leone Haven, MD Taking Active   Glycopyrrolate-Formoterol (BEVESPI AEROSPHERE) 9-4.8 MCG/ACT Hollie Salk [id number] Yes Inhale 2 puffs into the lungs 2 (two) times daily. Tyler Pita, MD Taking Active   hydrochlorothiazide (MICROZIDE) 12.5 MG capsule [id number] Yes Take 1 capsule (12.5 mg total) by mouth daily. Minna Merritts, MD Taking Active   ketoconazole (NIZORAL) 2 % cream [id number] No Apply 1 application topically daily.  Patient not taking: Reported on 07/31/2021   Leone Haven, MD Not Taking Active   ketoconazole (NIZORAL) 2 % shampoo [id number] No Apply 1 application topically 2 (two) times a week.  Patient not taking: Reported on 07/31/2021   Leone Haven, MD Not Taking Active   losartan (COZAAR) 25 MG tablet [id number] Yes Take 1 tablet (25 mg total) by  mouth 2 (two) times daily. Minna Merritts, MD Taking Active   magnesium oxide (MAG-OX) 400 MG tablet [id number] Yes Take 400 mg by mouth daily. [provider] Taking Active   metFORMIN (GLUCOPHAGE) 500 MG tablet [id number] Yes TAKE 2 TABLETS BY MOUTH  TWICE DAILY WITH MEALS Caryl Bis Angela Adam, MD Taking Active   metoprolol succinate (TOPROL-XL) 25 MG 24 hr tablet [id number] Yes Take 1 tablet (25 mg total) by mouth daily. Minna Merritts, MD Taking Active   montelukast (SINGULAIR) 10 MG tablet [id number] Yes TAKE 1 TABLET BY MOUTH AT  BEDTIME Leone Haven, MD Taking Active    Multiple Vitamin (MULTIVITAMIN) tablet [id number] Yes Take 1 tablet by mouth daily. [provider] Taking Active Self  nystatin (MYCOSTATIN) 100000 UNIT/ML suspension [id number] No Take 5 mLs (500,000 Units total) by mouth 4 (four) times daily. X 7-10 days  Patient not taking: Reported on 07/31/2021   McLean-Scocuzza, Nino Glow, MD Not Taking Active   nystatin (MYCOSTATIN/NYSTOP) powder [id number] No Apply 1 application topically 2 (two) times daily.  Patient not taking: Reported on 07/31/2021   [name], MD Not Taking Active   nystatin cream (MYCOSTATIN) [id number] No Apply 1 application topically 2 (two) times daily.  Patient not taking: Reported on 07/31/2021   Leone Haven, MD Not Taking Active            Med Note Coralee Pesa Nov 05, 2020 10:46 AM)    ondansetron (ZOFRAN ODT) 4 MG disintegrating tablet [id number] No Take 1 tablet (4 mg total) by mouth every 8 (eight) hours as needed for nausea or vomiting.  Patient not taking: Reported on 07/31/2021   Earleen Newport, MD Not Taking Active   OXYGEN [id number] Yes Inhale 2 L into the lungs daily. 2 liters at night, 1 liter when doing activities, and no oxygen when at home during the day. [provider] Taking Active Self  potassium chloride (KLOR-CON) 10 MEQ tablet [id number] Yes Take 1 tablet (10 mEq total) by mouth 2 (two) times daily. Minna Merritts, MD Taking Active   PROVENTIL HFA [id number] Base) MCG/ACT inhaler [id number] Yes Inhale 2 puffs into the lungs every 6 (six) hours as needed for wheezing or shortness of breath. 18 gram inhaler McLean-Scocuzza, Nino Glow, MD Taking Active   rosuvastatin (CRESTOR) 20 MG tablet [id number] Yes Take 1 tablet (20 mg total) by mouth daily. Minna Merritts, MD Taking Active   sodium chloride (OCEAN) 0.65 % SOLN nasal spray [id number]  Place 2 sprays into both nostrils as needed for congestion. McLean-Scocuzza, Nino Glow, MD  Active   vitamin C (ASCORBIC ACID) 500 MG tablet  [id number] Yes Take 1,000 mg by mouth daily.  [provider] Taking Active   Vitamin E 180 MG CAPS [id number] Yes Take 1 capsule by mouth daily. [provider] Taking Active             Pertinent Labs:   Lab Results  Component Value Date   HGBA1C 6.9 (H) 05/24/2021   Lab Results  Component Value Date   CHOL 99 08/22/2020   HDL 39.70 08/22/2020   LDLCALC 33 08/22/2020   LDLDIRECT 57.0 07/27/2018   TRIG 131.0 08/22/2020   CHOLHDL 2 08/22/2020   Lab Results  Component Value Date   CREATININE 0.57 02/20/2021   BUN 11 02/20/2021   NA 141 02/20/2021   K 4.6 02/20/2021   CL 97 02/20/2021   CO2 37 (H) 02/20/2021  SDOH:  (Social Determinants of Health) assessments and interventions performed: Yes SDOH Interventions    Flowsheet Row Most Recent Value  SDOH Interventions   Financial Strain Interventions Other (Comment)  [manufacturer assistance]       CCM Care Plan  Review of patient past medical history, allergies, medications, health status, including review of consultants reports, laboratory and other test data, was performed as part of comprehensive evaluation and provision of chronic care management services.   Conditions to be addressed/monitored:  Hypertension, Hyperlipidemia, Diabetes, and Heart Failure  Care Plan : Medication Management  Updates made by De Hollingshead, RPH-CPP since 07/31/2021 12:00 AM     Problem: Diabetes, COPD, HTN, CHF      Goal: Disease Progression Prevention   Start Date: 10/31/2020  This Visit's Progress: On track  Recent Progress: On track  Priority: High  Note:   Current Barriers:  Unable to independently afford treatment regimen Complex patient with multiple comorbidities at high risk for exacerbation   Pharmacist Clinical Goal(s):  Over the next 90 days, patient will verbalize ability to afford treatment regimen Over the next 90 days, patient will achieve adherence to monitoring guidelines and  medication adherence to achieve therapeutic efficacy through collaboration with PharmD and provider.   Interventions: 1:1 collaboration with Leone Haven, MD regarding development and update of comprehensive plan of care as evidenced by provider attestation and co-signature Inter-disciplinary care team collaboration (see longitudinal plan of care) Comprehensive medication review performed; medication list updated in electronic medical record  Health Maintenance   Yearly diabetic eye exam: up to date Yearly diabetic foot exam: up to date Urine microalbumin: up to date Yearly influenza vaccination: up to date Td/Tdap vaccination: up to date Pneumonia vaccination: up to date but documentation is incorrect. Will collaborate w/ office staff to update COVID vaccinations: due - recommended to pursue bivalent booster Shingrix vaccinations: due - recommended to pursue in 2023  Diabetes: Controlled per A1c; current treatment: metformin 1000 mg BID  Current dietary patterns: breakfast: banana or other fruits, sometimes boiled eggs; 2-3 pm lunch: sandwich, vegetables, chicken or pork chops; 6 pm- supper; similar to lunch, often leftovers; drinks: 1 glass of soda per day, water, black coffee, sweet tea (makes at home); snacks/desserts: fruit, but occasionally Current physical activity: limited by heat/humidity (breathing limits this) Previously discussed GLP1 for weight assistance, but patient declined [place] to work up thyroid nodules.  Discussed SGLT2 in light of dual CHF/DM. Encouraged to discuss w/ PCP and Dr. Rockey Situ moving forward. Patient would qualify for assistance for Farxiga through [employer].   Heart Failure, preserved:  Appropriately managed with opportunities for optimization; follows w/ Dr. Rockey Situ ARNI/ACEi/ARB: losartan 25 mg BID Beta blocker: metoprolol succinate 25 mg  SGLT2: none, consider moving forward Mineralocorticoid Receptor Antagonist: none, consider moving  forward Diuretic: furosemide 20 mg PRN, potassium 10 mEq BID,  Current home vitals: 120-130s/70s; HR reports her machine does not report pulse/HF  Confirms understanding of importance of weighing daily. Confirms understanding to contact cardiology/primary care team if weight gain >3 lbs in 1 day or >5 lbs in 1 week Discussed benefit of SGLT2 as above. Discussed SGLT2 in light of dual CHF/DM. Encouraged to discuss w/ PCP and Dr. Rockey Situ moving forward. Patient would qualify for assistance for Farxiga through [employer].   Hyperlipidemia and ASCVD risk reduction: Controlled per last lipid panel; current treatment: rosuvastatin 20 mg daily;  Antiplatelet regimen: aspirin 81 mg daily Recommended to continue current regimen at this time  Chronic  Obstructive Pulmonary Disease; Chronic Sinus Congestion often leading to COPD exacerbation: Controlled COPD, sinus symptoms; current treatment: Bevespi 9/4.8 mcg 2 puffs BID, albuterol HFA PRN, albuterol nebulizer PRN; chronic O2 use. Allergy regimen: cetirizine 10 mg daily, fluticasone intranasal both nostrils BID, montelukast 10 mg daily, nasal saline spray continues to be beneficial in clearing out sinuses to reduce frequency of sinus infections; follows w/ Dr. Patsey Berthold Reports more nasal/head congestion recently. More productive cough.  Unable to utilize Trelegy, [employer]- use of inhaled corticosteroid resulted in mouth irritation, even if she rinsed her mouth out well after use.  Approved for Bevespi assistance through 09/28/21 GOLD Classification: D Discussed trying to switch to a different daily antihistamine, such as generic Xyzal. Patient will try this prior to appointment with Dr. Patsey Berthold. Also discussed addition of azelastine nasal spray, though discussed that her nasal passages are often irritated by nasal cannula. Discussed use of nasal saline for rehydration if nasal passages get dry  Hot Flashes: Improved; gabapentin 100 mg TID PRN Reports  improvement in hot flash symptoms, also reports some improvement in mild peripheral neuropathy. Denies sedation.  Previously discussed medication, side effects  Supplements: Vitamin C, Vitamin D + Calcium, Vitamin E, zinc  Patient Goals/Self-Care Activities Over the next 90 days, patient will:  - take medications as prescribed check glucose periodically, document, and provide at future appointments check blood pressure periodically, document, and provide at future appointments collaborate with provider on medication access solutions       Plan: Telephone follow up appointment with care management team member scheduled for:  3 months  [name], PharmD, [place], [place] Pharmacist [employer] at [employer] [phone]

## 2021-07-31 NOTE — Patient Instructions (Addendum)
Martha Woodard,   It was great talking with you today!  Talk to Dr. Caryl Bis and/or Dr. Rockey Situ about adding a medication called Wilder Glade - this is a medication for diabetes and heart failure.   We recommend you get the updated bivalent COVID-19 booster, at least 2 months after any prior doses. You may consider delaying a booster dose by 3 months from a prior episode of COVID-19 per the CDC.   You can find pharmacies that have this formulation in stock at [email or web address].   We also recommend you pursue the Shingrix vaccine series at your local pharmacy. If you have a copay now, there should be a $0 copay in 2023.   Take care!  [name], PharmD   Visit Information  Patient verbalizes understanding of instructions provided today and agrees to view in [place].    Plan: Telephone follow up appointment with care management team member scheduled for:  3 months  [name], PharmD, [place], [place] Clinical Pharmacist [employer] at [employer] [phone]

## 2021-08-07 ENCOUNTER — Telehealth: Payer: Self-pay | Admitting: Pharmacy Technician

## 2021-08-07 NOTE — Progress Notes (Signed)
[facility])                                            [place] Team    08/07/2021  Martha Woodard 23-Sep-1966 [id number]  FOR 2023 RE ENROLLMENT                                      Medication Assistance Referral  Referral From: [place] Catie T.   Medication/Company: [employer]ke / [place] Patient application portion:  N/A per program guidelines since patient enrolled in 2022 and has Medicare Part D she will automatically be re enrolled in 4098 Provider application portion: Faxed  to Dr. Vernard Gambles Provider address/fax verified via: Office website    Martha Woodard P. Yuette Putnam, [place]  [phone]

## 2021-08-13 ENCOUNTER — Ambulatory Visit (INDEPENDENT_AMBULATORY_CARE_PROVIDER_SITE_OTHER): Payer: Medicare Other

## 2021-08-13 VITALS — Ht 65.0 in | Wt 251.0 lb

## 2021-08-13 DIAGNOSIS — Z0000 Encounter for general adult medical examination without abnormal findings: Secondary | ICD-10-CM

## 2021-08-13 DIAGNOSIS — Z1231 Encounter for screening mammogram for malignant neoplasm of breast: Secondary | ICD-10-CM

## 2021-08-13 NOTE — Patient Instructions (Addendum)
  Martha Woodard , Thank you for taking time to come for your Medicare Wellness Visit. I appreciate your ongoing commitment to your health goals. Please review the following plan we discussed and let me know if I can assist you in the future.   These are the goals we discussed:  Goals       Patient Stated     Medication Monitoring (pt-stated)      Patient Goals/Self-Care Activities Over the next 90 days, patient will:  - take medications as prescribed check glucose periodically, document, and provide at future appointments check blood pressure periodically, document, and provide at future appointments collaborate with provider on medication access solutions       Weight (lb) < 200 lb (90.7 kg) (pt-stated)      Eat healthy - salad, eggs, healthy soup. Exercise - walks 20 minutes daily, COPD limits this.        This is a list of the screening recommended for you and due dates:  Health Maintenance  Topic Date Due   COVID-19 Vaccine (3 - Moderna risk series) 08/29/2021*   Zoster (Shingles) Vaccine (1 of 2) 11/13/2021*   Pneumococcal Vaccination (2 - PCV) 03/18/2022*   Complete foot exam   08/22/2021   Hemoglobin A1C  11/24/2021   Eye exam for diabetics  02/06/2022   Mammogram  04/24/2022   Tetanus Vaccine  06/10/2025   Colon Cancer Screening  12/31/2025   Pap Smear  02/05/2026   Flu Shot  Completed   Hepatitis C Screening: USPSTF Recommendation to screen - Ages 18-79 yo.  Completed   HIV Screening  Completed   HPV Vaccine  Aged Out  *Topic was postponed. The date shown is not the original due date.

## 2021-08-13 NOTE — Progress Notes (Signed)
Subjective:   Martha Woodard is a 55 y.o. female who presents for Medicare Annual (Subsequent) preventive examination.  Review of Systems    No ROS.  Medicare Wellness Virtual Visit.  Visual/audio telehealth visit, UTA vital signs.   See social history for additional risk factors.   Cardiac Risk Factors include: advanced age (>34mn, >>51women);diabetes mellitus     Objective:    Today's Vitals   08/13/21 0827  Weight: 251 lb (113.9 kg)  Height: 5' 5"  (1.651 m)   Body mass index is 41.77 kg/m.  Advanced Directives 08/13/2021 08/10/2020 08/10/2019 12/03/2018 12/24/2017 09/25/2015 09/25/2015  Does Patient Have a Medical Advance Directive? No No No No No No No  Would patient like information on creating a medical advance directive? No - Patient declined No - Patient declined No - Patient declined No - Patient declined Yes (MAU/Ambulatory/Procedural Areas - Information given) Yes - Spiritual care consult ordered -    Current Medications (verified) Outpatient Encounter Medications as of 08/13/2021  Medication Sig   ACCU-CHEK AVIVA PLUS test strip TEST UP TO 4 TIMES DAILY AS DIRECTED   Accu-Chek Softclix Lancets lancets TEST UP TO 4 TIMES DAILY   acetaminophen (TYLENOL) 650 MG CR tablet Take 650 mg by mouth every 8 (eight) hours as needed for pain. (Patient not taking: Reported on 07/31/2021)   albuterol (PROVENTIL) (2.5 MG/3ML) 0.083% nebulizer solution Take 3 mLs (2.5 mg total) by nebulization every 6 (six) hours as needed for wheezing or shortness of breath.   aspirin EC 81 MG tablet Take 1 tablet (81 mg total) by mouth daily.   blood glucose meter kit and supplies Dispense based on patient and insurance preference. Use up to four times daily as directed. (FOR ICD-9 250.00, 250.01).   calcium-vitamin D (OSCAL WITH D) 500-200 MG-UNIT tablet Take 1 tablet by mouth 2 (two) times daily.   cetirizine (ZYRTEC) 10 MG tablet Take 10 mg by mouth daily.   fluticasone (FLONASE) 50 MCG/ACT  nasal spray USE 1 OR 2 SPRAYS IN EACH NOSTRIL ONCE DAILY.   furosemide (LASIX) 20 MG tablet Take 1 tablet (20 mg total) by mouth daily as needed.   gabapentin (NEURONTIN) 100 MG capsule Take 1 capsule (100 mg total) by mouth 3 (three) times daily.   Glycopyrrolate-Formoterol (BEVESPI AEROSPHERE) 9-4.8 MCG/ACT AERO Inhale 2 puffs into the lungs 2 (two) times daily.   hydrochlorothiazide (MICROZIDE) 12.5 MG capsule Take 1 capsule (12.5 mg total) by mouth daily.   ketoconazole (NIZORAL) 2 % cream Apply 1 application topically daily. (Patient not taking: Reported on 07/31/2021)   ketoconazole (NIZORAL) 2 % shampoo Apply 1 application topically 2 (two) times a week. (Patient not taking: Reported on 07/31/2021)   losartan (COZAAR) 25 MG tablet Take 1 tablet (25 mg total) by mouth 2 (two) times daily.   magnesium oxide (MAG-OX) 400 MG tablet Take 400 mg by mouth daily.   metFORMIN (GLUCOPHAGE) 500 MG tablet TAKE 2 TABLETS BY MOUTH  TWICE DAILY WITH MEALS   metoprolol succinate (TOPROL-XL) 25 MG 24 hr tablet Take 1 tablet (25 mg total) by mouth daily.   montelukast (SINGULAIR) 10 MG tablet TAKE 1 TABLET BY MOUTH AT  BEDTIME   Multiple Vitamin (MULTIVITAMIN) tablet Take 1 tablet by mouth daily.   nystatin (MYCOSTATIN) 100000 UNIT/ML suspension Take 5 mLs (500,000 Units total) by mouth 4 (four) times daily. X 7-10 days (Patient not taking: Reported on 07/31/2021)   nystatin (MYCOSTATIN/NYSTOP) powder Apply 1 application topically 2 (two) times  daily. (Patient not taking: Reported on 07/31/2021)   nystatin cream (MYCOSTATIN) Apply 1 application topically 2 (two) times daily. (Patient not taking: Reported on 07/31/2021)   ondansetron (ZOFRAN ODT) 4 MG disintegrating tablet Take 1 tablet (4 mg total) by mouth every 8 (eight) hours as needed for nausea or vomiting. (Patient not taking: Reported on 07/31/2021)   OXYGEN Inhale 2 L into the lungs daily. 2 liters at night, 1 liter when doing activities, and no oxygen when  at home during the day.   potassium chloride (KLOR-CON) 10 MEQ tablet Take 1 tablet (10 mEq total) by mouth 2 (two) times daily.   PROVENTIL HFA 108 (90 Base) MCG/ACT inhaler Inhale 2 puffs into the lungs every 6 (six) hours as needed for wheezing or shortness of breath. 18 gram inhaler   rosuvastatin (CRESTOR) 20 MG tablet Take 1 tablet (20 mg total) by mouth daily.   sodium chloride (OCEAN) 0.65 % SOLN nasal spray Place 2 sprays into both nostrils as needed for congestion.   vitamin C (ASCORBIC ACID) 500 MG tablet Take 1,000 mg by mouth daily.    Vitamin E 180 MG CAPS Take 1 capsule by mouth daily.   No facility-administered encounter medications on file as of 08/13/2021.    Allergies (verified) Augmentin [amoxicillin-pot clavulanate]   History: Past Medical History:  Diagnosis Date   CHF (congestive heart failure) ([place])    Colon polyp 01-01-2016   COPD (chronic obstructive pulmonary disease) (H[facility])    Diabetes mellitus without complication ([facility])    Diastolic heart failure ([facility])    HLD (hyperlipidemia)    Hypertension    Morbid obesity with BMI of 45.0-49.9, adult ([place])    Multiple lung nodules on CT    Shortness of breath dyspnea    Past Surgical History:  Procedure Laterality Date   BIOPSY THYROID Right 02-06-14   NEGATIVE FOR MALIGNANT CELLS proteinaceous material and macrophages.   BREAST BIOPSY Left    neg- core   COLONOSCOPY WITH PROPOFOL N/A 01/01/2016   Procedure: COLONOSCOPY WITH PROPOFOL;  Surgeon: Lucilla Lame, MD;  Location: [facility] ENDOSCOPY;  Service: Endoscopy;  Laterality: N/A;   thryoid fna Right April 2015   Proteinaceous material and macrophages.   WISDOM TOOTH EXTRACTION     Family History  Problem Relation Age of Onset   Cancer Mother 70       uterian and ovarian    Goiter Mother    Stroke Father    Breast cancer Sister 45   Stroke Brother    Breast cancer Sister 33   Heart murmur Sister    Social History   Socioeconomic History   Marital status:  Single    Spouse name: Not on file   Number of children: Not on file   Years of education: Not on file   Highest education level: Not on file  Occupational History   Not on file  Tobacco Use   Smoking status: Former    Packs/day: 3.00    Years: 33.00    Pack years: 99.00    Types: Cigarettes    Quit date: 07/10/2013    Years since quitting: 8.0   Smokeless tobacco: Never  Vaping Use   Vaping Use: Former  Substance and Sexual Activity   Alcohol use: No    Alcohol/week: 0.0 standard drinks   Drug use: No   Sexual activity: Not Currently  Other Topics Concern   Not on file  Social History Narrative   Not on file  Social Determinants of Health   Financial Resource Strain: Medium Risk   Difficulty of Paying Living Expenses: Somewhat hard  Food Insecurity: No Food Insecurity   Worried About Charity fundraiser in the Last Year: Never true   Ran Out of Food in the Last Year: Never true  Transportation Needs: No Transportation Needs   Lack of Transportation (Medical): No   Lack of Transportation (Non-Medical): No  Physical Activity: Insufficiently Active   Days of Exercise per Week: 7 days   Minutes of Exercise per Session: 20 min  Stress: No Stress Concern Present   Feeling of Stress : Not at all  Social Connections: Socially Isolated   Frequency of Communication with Friends and Family: More than three times a week   Frequency of Social Gatherings with Friends and Family: Three times a week   Attends Religious Services: Never   Active Member of Clubs or Organizations: No   Attends Music therapist: Never   Marital Status: Divorced    Tobacco Counseling Counseling given: Not Answered   Clinical Intake:  Pre-visit preparation completed: Yes        Diabetes: Yes (Followed by pcp)  How often do you need to have someone help you when you read instructions, pamphlets, or other written materials from your doctor or pharmacy?: 1 - Never  Nutrition  Risk Assessment: Does the patient have any non-healing wounds?  No   Diabetes: How often do you monitor your CBG's? Daily.   Financial Strains and Diabetes Management: Is the patient seen by Chronic Care Management for management of their diabetes?  Yes  Would the patient like to be referred to a Nutritionist or for Diabetic Management?  No  Interpreter Needed?: No      Activities of Daily Living In your present state of health, do you have any difficulty performing the following activities: 08/13/2021  Hearing? N  Vision? N  Difficulty concentrating or making decisions? N  Walking or climbing stairs? N  Comment Paces self. Oxygen in use. COPD.  Dressing or bathing? N  Doing errands, shopping? N  Preparing Food and eating ? N  Using the Toilet? N  In the past six months, have you accidently leaked urine? N  Do you have problems with loss of bowel control? N  Managing your Medications? N  Managing your Finances? N  Housekeeping or managing your Housekeeping? N  Some recent data might be hidden    Patient Care Team: Leone Haven, MD as PCP - General (Family Medicine) Bary Castilla, Forest Gleason, MD (General Surgery) Coral Spikes, DO as Consulting Physician (Family Medicine) Minna Merritts, MD as Consulting Physician (Cardiology) De Hollingshead, RPH-CPP as Pharmacist (Pharmacist)  Indicate any recent Medical Services you may have received from other than [facility] providers in the past year (date may be approximate).     Assessment:   This is a routine wellness examination for Adalyn.  Virtual Visit via Telephone Note  I connected with  Martha Woodard on 08/13/21 at  8:15 AM EST by telephone and verified that I am speaking with the correct person using two identifiers.  Location: Patient: home Provider: office Persons participating in the virtual visit: patient/Nurse Health Advisor   I discussed the limitations, risks, security and privacy concerns of performing an  evaluation and management service by telephone and the availability of in person appointments. The patient expressed understanding and agreed to proceed.  Interactive audio and video telecommunications were attempted between this  nurse and patient, however failed, due to patient having technical difficulties OR patient did not have access to video capability.  We continued and completed visit with audio only.  Some vital signs may be absent or patient reported.   Hearing/Vision screen Hearing Screening - Comments:: Patient is able to hear conversational tones without difficulty. No issues reported. Vision Screening - Comments:: Wears corrective lenses They have seen their ophthalmologist in the last 12 months. No retinopathy reported . Followed by Dr. Ellin Mayhew.  Dietary issues and exercise activities discussed: Current Exercise Habits: Home exercise routine, Type of exercise: walking, Time (Minutes): 20, Frequency (Times/Week): 7, Weekly Exercise (Minutes/Week): 140, Intensity: Mild Regular diet Good water intake   Goals Addressed               This Visit's Progress     Patient Stated     Weight (lb) < 200 lb (90.7 kg) (pt-stated)   251 lb (113.9 kg)     Eat healthy - salad, eggs, healthy soup. Exercise - walks 20 minutes daily, COPD limits this.       Depression Screen PHQ 2/9 Scores 08/13/2021 05/24/2021 03/05/2021 08/10/2020 02/07/2020 09/19/2019 08/29/2019  PHQ - 2 Score 0 0 0 0 0 0 0  PHQ- 9 Score - - - - - - -    Fall Risk Fall Risk  08/13/2021 05/24/2021 10/23/2020 08/10/2020 05/23/2020  Falls in the past year? 0 0 0 0 0  Number falls in past yr: 0 0 0 0 0  Injury with Fall? - - 0 - 0  Follow up Falls evaluation completed Falls evaluation completed Falls evaluation completed Falls evaluation completed Falls evaluation completed    [place]: Home free of loose throw rugs in walkways, pet beds, electrical cords, etc? Yes  Adequate  lighting in your home to reduce risk of falls? Yes   ASSISTIVE DEVICES UTILIZED TO PREVENT FALLS: Life alert? No  Use of a cane, walker or w/c? No  Grab bars in the bathroom? No  Shower chair or bench in shower? No  Elevated toilet seat or a handicapped toilet? No   TIMED UP AND GO: Was the test performed? No .   Cognitive Function: MMSE - Mini Mental State Exam 12/24/2017  Orientation to time 5  Orientation to Place 5  Registration 3  Attention/ Calculation 5  Recall 2  Language- name 2 objects 2  Language- repeat 1  Language- follow 3 step command 3  Language- read & follow direction 1  Write a sentence 1  Copy design 1  Total score 29     6CIT Screen 08/13/2021 08/10/2020 08/10/2019 12/24/2017  What Year? 0 points 0 points 0 points 0 points  What month? 0 points 0 points 0 points 0 points  What time? 0 points 0 points 0 points 0 points  Count back from 20 0 points 0 points 0 points 0 points  Months in reverse 0 points 0 points 0 points 0 points  Repeat phrase 0 points 0 points 0 points 0 points  Total Score 0 0 0 0    Immunizations Immunization History  Administered Date(s) Administered   Influenza,inj,Quad PF,6+ Mos 07/02/2015, 08/19/2016, 08/07/2017, 06/16/2018, 06/29/2019, 07/31/2020, 07/05/2021   Moderna SARS-COV2 Booster Vaccination 11/05/2020   Moderna Sars-Covid-2 Vaccination 03/08/2020, 04/05/2020   Pneumococcal Polysaccharide-23 09/26/2015   Pneumococcal-Unspecified 11/23/2020   Covid vaccine- plans to receive later in the season.   Screening Tests Health Maintenance  Topic Date Due  COVID-19 Vaccine (3 - Moderna risk series) 08/29/2021 (Originally 12/03/2020)   Zoster Vaccines- Shingrix (1 of 2) 11/13/2021 (Originally 09/13/1983)   Pneumococcal Vaccine 45-36 Years old (2 - PCV) 03/18/2022 (Originally 11/23/2021)   FOOT EXAM  08/22/2021   HEMOGLOBIN A1C  11/24/2021   OPHTHALMOLOGY EXAM  02/06/2022   MAMMOGRAM  04/24/2022   TETANUS/TDAP  06/10/2025    COLONOSCOPY (Pts 45-72yr Insurance coverage will need to be confirmed)  12/31/2025   PAP SMEAR-Modifier  02/05/2026   INFLUENZA VACCINE  Completed   Hepatitis C Screening  Completed   HIV Screening  Completed   HPV VACCINES  Aged Out    Health Maintenance  There are no preventive care reminders to display for this patient.  Mammogram- ordered.   Vision Screening: Recommended annual ophthalmology exams for early detection of glaucoma and other disorders of the eye. Is the patient up to date with their annual eye exam?  Yes   Dental Screening: Recommended annual dental exams for proper oral hygiene.  Community Resource Referral / Chronic Care Management: CRR required this visit?  No   CCM required this visit?  No      Plan:   Keep all routine maintenance appointments.   I have personally reviewed and noted the following in the patient's chart:   Medical and social history Use of alcohol, tobacco or illicit drugs  Current medications and supplements including opioid prescriptions. Not taking opioid.  Functional ability and status Nutritional status Physical activity Advanced directives List of other physicians Hospitalizations, surgeries, and ER visits in previous 12 months Vitals Screenings to include cognitive, depression, and falls Referrals and appointments  In addition, I have reviewed and discussed with patient certain preventive protocols, quality metrics, and best practice recommendations. A written personalized care plan for preventive services as well as general preventive health recommendations were provided to patient.     OVarney Biles LPN   120/72/1828

## 2021-08-19 ENCOUNTER — Telehealth: Payer: Self-pay | Admitting: Pharmacy Technician

## 2021-08-19 DIAGNOSIS — Z596 Low income: Secondary | ICD-10-CM

## 2021-08-19 NOTE — Progress Notes (Signed)
[facility])                                            [place] Team    08/19/2021  Martha Woodard 08/11/1966 [id number]  Received provider portion(s) of patient assistance application(s) for [employer]. Faxed completed application and required documents into [employer].   Martha Woodard Martha Woodard, [place]  [phone]

## 2021-08-27 ENCOUNTER — Telehealth: Payer: Medicare Other | Admitting: Family Medicine

## 2021-08-28 DIAGNOSIS — I10 Essential (primary) hypertension: Secondary | ICD-10-CM | POA: Diagnosis not present

## 2021-08-28 DIAGNOSIS — E1159 Type 2 diabetes mellitus with other circulatory complications: Secondary | ICD-10-CM | POA: Diagnosis not present

## 2021-08-28 DIAGNOSIS — E782 Mixed hyperlipidemia: Secondary | ICD-10-CM | POA: Diagnosis not present

## 2021-08-28 DIAGNOSIS — I5032 Chronic diastolic (congestive) heart failure: Secondary | ICD-10-CM

## 2021-08-29 ENCOUNTER — Other Ambulatory Visit: Payer: Self-pay | Admitting: Family Medicine

## 2021-08-29 ENCOUNTER — Encounter: Payer: Self-pay | Admitting: Family Medicine

## 2021-08-29 ENCOUNTER — Ambulatory Visit: Payer: Medicare Other | Admitting: Pulmonary Disease

## 2021-08-29 ENCOUNTER — Telehealth (INDEPENDENT_AMBULATORY_CARE_PROVIDER_SITE_OTHER): Payer: Medicare Other | Admitting: Family Medicine

## 2021-08-29 ENCOUNTER — Other Ambulatory Visit
Admission: RE | Admit: 2021-08-29 | Discharge: 2021-08-29 | Disposition: A | Discharge status: Home / Self Care | Payer: Medicare Other | Source: Ambulatory Visit | Attending: Pulmonary Disease | Admitting: Pulmonary Disease

## 2021-08-29 ENCOUNTER — Other Ambulatory Visit: Payer: Self-pay

## 2021-08-29 ENCOUNTER — Encounter: Payer: Self-pay | Admitting: Pulmonary Disease

## 2021-08-29 VITALS — BP 132/70 | HR 88 | Temp 97.1°F | Ht 66.0 in | Wt 256.4 lb

## 2021-08-29 DIAGNOSIS — J9612 Chronic respiratory failure with hypercapnia: Secondary | ICD-10-CM | POA: Diagnosis not present

## 2021-08-29 DIAGNOSIS — I10 Essential (primary) hypertension: Secondary | ICD-10-CM

## 2021-08-29 DIAGNOSIS — J439 Emphysema, unspecified: Secondary | ICD-10-CM | POA: Diagnosis not present

## 2021-08-29 DIAGNOSIS — J9611 Chronic respiratory failure with hypoxia: Secondary | ICD-10-CM | POA: Diagnosis not present

## 2021-08-29 DIAGNOSIS — E1159 Type 2 diabetes mellitus with other circulatory complications: Secondary | ICD-10-CM

## 2021-08-29 DIAGNOSIS — B370 Candidal stomatitis: Secondary | ICD-10-CM

## 2021-08-29 DIAGNOSIS — J449 Chronic obstructive pulmonary disease, unspecified: Secondary | ICD-10-CM

## 2021-08-29 DIAGNOSIS — J440 Chronic obstructive pulmonary disease with acute lower respiratory infection: Secondary | ICD-10-CM

## 2021-08-29 DIAGNOSIS — R232 Flushing: Secondary | ICD-10-CM

## 2021-08-29 DIAGNOSIS — Z87891 Personal history of nicotine dependence: Secondary | ICD-10-CM

## 2021-08-29 DIAGNOSIS — E662 Morbid (severe) obesity with alveolar hypoventilation: Secondary | ICD-10-CM

## 2021-08-29 MED ORDER — NYSTATIN 100000 UNIT/ML MT SUSP: 5.0000 mL | Freq: Four times a day (QID) | OROMUCOSAL | 0 refills | Status: DC

## 2021-08-29 MED ORDER — BUDESONIDE 180 MCG/ACT IN AEPB: 1.0000 | INHALATION_SPRAY | Freq: Two times a day (BID) | RESPIRATORY_TRACT | 0 refills | Status: DC

## 2021-08-29 MED ORDER — PREDNISONE 10 MG (21) PO TBPK: ORAL_TABLET | ORAL | 0 refills | Status: DC

## 2021-08-29 MED ORDER — AZITHROMYCIN 250 MG PO TABS: ORAL_TABLET | ORAL | 0 refills | Status: AC

## 2021-08-29 NOTE — Patient Instructions (Addendum)
I have provided you a sample of Pulmicort 180 mcg 1 puff twice a day AFTER your Bevespi.  Make sure you rinse your mouth well after use.  You can use some baking soda in the rinse water.  We have sent prescriptions for prednisone and azithromycin to your pharmacy.  We are checking a blood test for allergies.  We will see you in follow-up in 3 to 4 weeks time you will either see me or the nurse practitioner at that time.

## 2021-08-29 NOTE — Assessment & Plan Note (Signed)
Generally well controlled.  She will continue metformin 1000 mg twice daily.  Check A1c with lab work.

## 2021-08-29 NOTE — Assessment & Plan Note (Signed)
Well-controlled at pulmonology.  The patient will continue losartan 25 mg once daily and hydrochlorothiazide 12.5 mg daily.  She will come in for lab work in a couple of weeks.

## 2021-08-29 NOTE — Progress Notes (Signed)
Subjective:    Patient ID: Martha Woodard, female    DOB: [birth year], 54 y.o.   MRN: [id number] Chief Complaint  Patient presents with   Follow-up    Copd follow up - sob, coughing, wheezing.    PROBLEMS: Chronic hypoxic/hypercarbic respiratory failure Former smoker Moderate/severe COPD [facility] CHF Multiple pulmonary nodules - stable > 2 yrs and deemed benign   DATA: 09/28/2015 2D echo: EF 60 to 65%, grade 1 DD, mild left atrial dilation.  Right-sided pressures normal. 02/28/2020 PFTs:FEV1 1.42L or 49%, FVC or 2.24L 60%, FEV1/FVC 63%, no bronchodilator response.  RV is 122% indicating air trapping.  Diffusion capacity normal.  Consistent with moderate to severe COPD. 04/05/2020 chest x-ray: No active disease.  Consistent with COPD, left base scarring/atelectasis.   INTERVAL HISTORY: Last seen by me on 03/18/2021. No major exacerbations since then.  She does report increased nasal congestion and yellow/green nasal discharge over the last week now with congested cough.  No fever.   HPI Martha Woodard is 55 year old former smoker who presents for follow-up on the issue of COPD with chronic bronchitis.  She has an asthmatic component however does not tolerate inhaled corticosteroids due to thrush.  Previously had been on [place] but had to be switched to [employer].  She notes that lately does not feel that the [place] is "holding her".  Over the last week she has had issues with increased cough productive of yellowish to greenish sputum issues always start with sinus congestion and drainage.  She has not had any hemoptysis.  Continues to have issues with dyspnea on exertion that have been more accentuated over the last day or 2.  She is on oxygen at 1 L/min mostly due to obesity with obesity hypoventilation.  She has not had any fevers, chills or sweats.  Recent home test for COVID was negative.  She believes symptoms started with change in weather.   Review of Systems A 10 point review of systems was  performed and it is as noted above otherwise negative.  Patient Active Problem List   Diagnosis Date Noted   Dysuria 05/24/2021   Hot flashes 05/24/2021   Foot cramps 02/20/2021   De Quervain's tenosynovitis, left 11/23/2020   COVID-19 09/06/2019   Papilloma of oral cavity 12/13/2018   Seborrheic dermatitis 10/02/2018   Light headedness 06/17/2018   Foot pain 06/17/2018   Allergic rhinitis 11/09/2017   Venous insufficiency 08/07/2017   Chronic respiratory failure ([place]) 11/13/2016   Skin cyst 08/19/2016   Thyroid nodule 11/28/2015   COPD (chronic obstructive pulmonary disease) with emphysema ([name]) 11/13/2015   Chronic diastolic heart failure ([place]) 11/13/2015   Morbid obesity with BMI of 45.0-49.9, adult ([place]) 10/01/2015   Obesity hypoventilation syndrome ([place]) 10/01/2015   Eczema 10/01/2015   Essential hypertension 10/01/2015   Palpitations 06/26/2014   DM type 2 (diabetes mellitus, type 2) ([place]) 06/26/2014   Hyperlipidemia 06/26/2014   Lung nodule seen on imaging study 02/06/2014   Social History   Tobacco Use   Smoking status: Former    Packs/day: 3.00    Years: 33.00    Pack years: 99.00    Types: Cigarettes    Quit date: 07/10/2013    Years since quitting: 8.1   Smokeless tobacco: Never  Substance Use Topics   Alcohol use: No    Alcohol/week: 0.0 standard drinks   Allergies  Allergen Reactions   Augmentin [Amoxicillin-Pot Clavulanate] Itching    Hives Can take PCN and Amoxicillin alone    Current  Meds  Medication Sig   ACCU-CHEK AVIVA PLUS test strip TEST UP TO 4 TIMES DAILY AS DIRECTED   Accu-Chek Softclix Lancets lancets TEST UP TO 4 TIMES DAILY   acetaminophen (TYLENOL) 650 MG CR tablet Take 650 mg by mouth every 8 (eight) hours as needed for pain.   albuterol (PROVENTIL) (2.5 MG/3ML) 0.083% nebulizer solution Take 3 mLs (2.5 mg total) by nebulization every 6 (six) hours as needed for wheezing or shortness of breath.   aspirin EC 81 MG tablet Take 1  tablet (81 mg total) by mouth daily.   blood glucose meter kit and supplies Dispense based on patient and insurance preference. Use up to four times daily as directed. (FOR ICD-9 250.00, 250.01).   calcium-vitamin D (OSCAL WITH D) 500-200 MG-UNIT tablet Take 1 tablet by mouth 2 (two) times daily.   cetirizine (ZYRTEC) 10 MG tablet Take 10 mg by mouth daily.   fluticasone (FLONASE) 50 MCG/ACT nasal spray USE 1 OR 2 SPRAYS IN EACH NOSTRIL ONCE DAILY.   furosemide (LASIX) 20 MG tablet Take 1 tablet (20 mg total) by mouth daily as needed.   gabapentin (NEURONTIN) 100 MG capsule Take 1 capsule (100 mg total) by mouth 3 (three) times daily.   Glycopyrrolate-Formoterol (BEVESPI AEROSPHERE) 9-4.8 MCG/ACT AERO Inhale 2 puffs into the lungs 2 (two) times daily.   hydrochlorothiazide (MICROZIDE) 12.5 MG capsule Take 1 capsule (12.5 mg total) by mouth daily.   ketoconazole (NIZORAL) 2 % cream Apply 1 application topically daily.   ketoconazole (NIZORAL) 2 % shampoo Apply 1 application topically 2 (two) times a week.   losartan (COZAAR) 25 MG tablet Take 1 tablet (25 mg total) by mouth 2 (two) times daily.   magnesium oxide (MAG-OX) 400 MG tablet Take 400 mg by mouth daily.   metFORMIN (GLUCOPHAGE) 500 MG tablet TAKE 2 TABLETS BY MOUTH  TWICE DAILY WITH MEALS   metoprolol succinate (TOPROL-XL) 25 MG 24 hr tablet Take 1 tablet (25 mg total) by mouth daily.   montelukast (SINGULAIR) 10 MG tablet TAKE 1 TABLET BY MOUTH AT  BEDTIME   Multiple Vitamin (MULTIVITAMIN) tablet Take 1 tablet by mouth daily.   nystatin (MYCOSTATIN) 100000 UNIT/ML suspension Take 5 mLs (500,000 Units total) by mouth 4 (four) times daily. X 7-10 days   nystatin (MYCOSTATIN/NYSTOP) powder Apply 1 application topically 2 (two) times daily.   nystatin cream (MYCOSTATIN) Apply 1 application topically 2 (two) times daily.   ondansetron (ZOFRAN ODT) 4 MG disintegrating tablet Take 1 tablet (4 mg total) by mouth every 8 (eight) hours as needed  for nausea or vomiting.   OXYGEN Inhale 2 L into the lungs daily. 2 liters at night, 1 liter when doing activities, and no oxygen when at home during the day.   potassium chloride (KLOR-CON) 10 MEQ tablet Take 1 tablet (10 mEq total) by mouth 2 (two) times daily.   PROVENTIL HFA 108 (90 Base) MCG/ACT inhaler Inhale 2 puffs into the lungs every 6 (six) hours as needed for wheezing or shortness of breath. 18 gram inhaler   rosuvastatin (CRESTOR) 20 MG tablet Take 1 tablet (20 mg total) by mouth daily.   sodium chloride (OCEAN) 0.65 % SOLN nasal spray Place 2 sprays into both nostrils as needed for congestion.   vitamin C (ASCORBIC ACID) 500 MG tablet Take 1,000 mg by mouth daily.    Vitamin E 180 MG CAPS Take 1 capsule by mouth daily.   Immunization History  Administered Date(s) Administered   Influenza,inj,Quad  PF,6+ Mos 07/02/2015, 08/19/2016, 08/07/2017, 06/16/2018, 06/29/2019, 07/31/2020, 07/05/2021   Moderna SARS-COV2 Booster Vaccination 11/05/2020   Moderna Sars-Covid-2 Vaccination 03/08/2020, 04/05/2020   Pneumococcal Polysaccharide-23 09/26/2015   Pneumococcal-Unspecified 11/23/2020       Objective:   Physical Exam BP 132/70 (BP Location: Left Arm, Patient Position: Sitting, Cuff Size: Normal)   Pulse 88   Temp (!) 97.1 F (36.2 C) (Oral)   Ht 5' 6"  (1.676 m)   Wt 256 lb 6.4 oz (116.3 kg)   LMP 04/10/2014   SpO2 90%   BMI 41.38 kg/m  GENERAL: Obese woman, no acute distress, comfortable with nasal cannula O2.  Fully ambulatory. HEAD: Normocephalic, atraumatic. EYES: Pupils equal, round, reactive to light.  No scleral icterus. MOUTH: Nose/mouth/throat not examined due to masking requirements for COVID 19. NECK: Supple. No thyromegaly. Trachea midline. No JVD.  No adenopathy. PULMONARY: Mildly diminished breath sounds.  No rhonchi, mild end expiratory wheezing particularly in the upper lung zones. CARDIOVASCULAR: S1 and S2. Regular rate and rhythm.  No rubs, murmurs or gallops  heard. ABDOMEN: Obese, otherwise benign. MUSCULOSKELETAL: No joint deformity, no clubbing, no edema. NEUROLOGIC: No focal deficit, no gait disturbance, speech is fluent. SKIN: Intact,warm,dry. PSYCH: Mood and behavior normal         Assessment & Plan:     ICD-10-CM   1. Asthma-COPD overlap syndrome (HCC)  J44.9 Allergen Panel (27) + IGE    CBC With Differential   Will check allergen panel, CBC Reintroduce inhaled corticosteroid Pulmicort 180 mcg 1 puff twice daily Rinse mouth well after use    2. COPD with acute lower respiratory infection ([place])  J44.0    Azithromycin Z-Pak Prednisone taper pack    3. Chronic respiratory failure with hypoxia and hypercapnia (HCC)  J96.11    J96.12    Continue oxygen at 1 L/min Patient compliant with therapy    4. Obesity hypoventilation syndrome ([place])  E66.2    This issue adds complexity to her management    5. Former very heavy cigarette smoker (more than 40 per day)  Z87.891    No evidence of relapse      Orders Placed This Encounter  Procedures   Allergen Panel (27) + IGE    Standing Status:   Future    Standing Expiration Date:   08/29/2022   CBC With Differential    Standing Status:   Future    Standing Expiration Date:   08/29/2022   Meds ordered this encounter  Medications   predniSONE (STERAPRED UNI-PAK 21 TAB) 10 MG (21) TBPK tablet    Sig: Take as directed in the package    Dispense:  21 tablet    Refill:  0   azithromycin (ZITHROMAX) 250 MG tablet    Sig: Take 2 tablets (500 mg) on  Day 1,  followed by 1 tablet (250 mg) once daily on Days 2 through 5.    Dispense:  6 each    Refill:  0   budesonide (PULMICORT) 180 MCG/ACT inhaler    Sig: Inhale 1 puff into the lungs 2 (two) times daily.    Dispense:  1 each    Refill:  0   We will reintroduce inhaled corticosteroids to see if she can tolerate these.  She will be treated with prednisone and azithromycin for acute lower respiratory infection with mild COPD  exacerbation.  May have an allergic component will check with allergen panel.  We will see her in 3 to 4 weeks time she  is to contact us prior to that time should any new difficulties arise.  Renold Don, MD Advanced Bronchoscopy PCCM [facility]    *This note was dictated using voice recognition software/Dragon.  Despite best efforts to proofread, errors can occur which can change the meaning.  Any change was purely unintentional.

## 2021-08-29 NOTE — Progress Notes (Signed)
Virtual Visit via telephone Note  This visit type was conducted due to national recommendations for restrictions regarding the COVID-19 pandemic (e.g. social distancing).  This format is felt to be most appropriate for this patient at this time.  All issues noted in this document were discussed and addressed.  No physical exam was performed (except for noted visual exam findings with Video Visits).   I connected with Martha Woodard today at  2:00 PM EST by telephone and verified that I am speaking with the correct person using two identifiers. Location patient: home Location provider: home office Persons participating in the virtual visit: patient, provider  I discussed the limitations, risks, security and privacy concerns of performing an evaluation and management service by telephone and the availability of in person appointments. I also discussed with the patient that there may be a patient responsible charge related to this service. The patient expressed understanding and agreed to proceed.  Interactive audio and video telecommunications were attempted between this provider and patient, however failed, due to patient having technical difficulties OR patient did not have access to video capability.  We continued and completed visit with audio only.   Reason for visit: f/u  HPI: DIABETES Disease Monitoring: Blood Sugar ranges-100-135 Polyuria/phagia/dipsia- no      Optho- UTD Medications: Compliance- taking metformin Hypoglycemic symptoms- no  COPD: Medication compliance- taking bevespi  Dyspnea- yes  Wheezing- yes  Cough- yes  Productive- yes The patient saw pulmonology earlier today and they are treating her for bronchitis.  The prescribed azithromycin and prednisone.  Patient had noted several weeks of not breathing as well and having increased cough.  Hypertension: Taking HCTZ and losartan.  No chest pain.  Does have chronic edema right greater than left that resolves with propping  her legs up.   ROS: See pertinent positives and negatives per HPI.  Past Medical History:  Diagnosis Date   CHF (congestive heart failure) ([place])    Colon polyp 01-01-2016   COPD (chronic obstructive pulmonary disease) (HCC)    Diabetes mellitus without complication (HCC)    Diastolic heart failure (HCC)    HLD (hyperlipidemia)    Hypertension    Morbid obesity with BMI of 45.0-49.9, adult ([place])    Multiple lung nodules on CT    Shortness of breath dyspnea     Past Surgical History:  Procedure Laterality Date   BIOPSY THYROID Right 02-06-14   NEGATIVE FOR MALIGNANT CELLS proteinaceous material and macrophages.   BREAST BIOPSY Left    neg- core   COLONOSCOPY WITH PROPOFOL N/A 01/01/2016   Procedure: COLONOSCOPY WITH PROPOFOL;  Surgeon: Lucilla Lame, MD;  Location: [facility] ENDOSCOPY;  Service: Endoscopy;  Laterality: N/A;   thryoid fna Right April 2015   Proteinaceous material and macrophages.   WISDOM TOOTH EXTRACTION      Family History  Problem Relation Age of Onset   Cancer Mother 40       uterian and ovarian    Goiter Mother    Stroke Father    Breast cancer Sister 4   Stroke Brother    Breast cancer Sister 45   Heart murmur Sister     SOCIAL HX: Former smoker   Current Outpatient Medications:    ACCU-CHEK AVIVA PLUS test strip, TEST UP TO 4 TIMES DAILY AS DIRECTED, Disp: 200 strip, Rfl: 6   Accu-Chek Softclix Lancets lancets, TEST UP TO 4 TIMES DAILY, Disp: 400 each, Rfl: 3   acetaminophen (TYLENOL) 650 MG CR tablet, Take  650 mg by mouth every 8 (eight) hours as needed for pain., Disp: , Rfl:    albuterol (PROVENTIL) (2.5 MG/3ML) 0.083% nebulizer solution, Take 3 mLs (2.5 mg total) by nebulization every 6 (six) hours as needed for wheezing or shortness of breath., Disp: 75 mL, Rfl: 12   aspirin EC 81 MG tablet, Take 1 tablet (81 mg total) by mouth daily., Disp: 90 tablet, Rfl: 3   azithromycin (ZITHROMAX) 250 MG tablet, Take 2 tablets (500 mg) on  Day 1,  followed by 1  tablet (250 mg) once daily on Days 2 through 5., Disp: 6 each, Rfl: 0   blood glucose meter kit and supplies, Dispense based on patient and insurance preference. Use up to four times daily as directed. (FOR ICD-9 250.00, 250.01)., Disp: 1 each, Rfl: 0   budesonide (PULMICORT) 180 MCG/ACT inhaler, Inhale 1 puff into the lungs 2 (two) times daily., Disp: 1 each, Rfl: 0   calcium-vitamin D (OSCAL WITH D) 500-200 MG-UNIT tablet, Take 1 tablet by mouth 2 (two) times daily., Disp: , Rfl:    cetirizine (ZYRTEC) 10 MG tablet, Take 10 mg by mouth daily., Disp: , Rfl:    fluticasone (FLONASE) 50 MCG/ACT nasal spray, USE 1 OR 2 SPRAYS IN EACH NOSTRIL ONCE DAILY., Disp: 16 g, Rfl: 12   furosemide (LASIX) 20 MG tablet, Take 1 tablet (20 mg total) by mouth daily as needed., Disp: 90 tablet, Rfl: 3   Glycopyrrolate-Formoterol (BEVESPI AEROSPHERE) 9-4.8 MCG/ACT AERO, Inhale 2 puffs into the lungs 2 (two) times daily., Disp: 10.7 g, Rfl: 11   hydrochlorothiazide (MICROZIDE) 12.5 MG capsule, Take 1 capsule (12.5 mg total) by mouth daily., Disp: 90 capsule, Rfl: 3   ketoconazole (NIZORAL) 2 % cream, Apply 1 application topically daily., Disp: 15 g, Rfl: 0   ketoconazole (NIZORAL) 2 % shampoo, Apply 1 application topically 2 (two) times a week., Disp: 120 mL, Rfl: 0   losartan (COZAAR) 25 MG tablet, Take 1 tablet (25 mg total) by mouth 2 (two) times daily., Disp: 180 tablet, Rfl: 3   magnesium oxide (MAG-OX) 400 MG tablet, Take 400 mg by mouth daily., Disp: , Rfl:    metFORMIN (GLUCOPHAGE) 500 MG tablet, TAKE 2 TABLETS BY MOUTH  TWICE DAILY WITH MEALS, Disp: 360 tablet, Rfl: 3   metoprolol succinate (TOPROL-XL) 25 MG 24 hr tablet, Take 1 tablet (25 mg total) by mouth daily., Disp: 90 tablet, Rfl: 3   montelukast (SINGULAIR) 10 MG tablet, TAKE 1 TABLET BY MOUTH AT  BEDTIME, Disp: 30 tablet, Rfl: 11   Multiple Vitamin (MULTIVITAMIN) tablet, Take 1 tablet by mouth daily., Disp: , Rfl:    nystatin (MYCOSTATIN) 100000  UNIT/ML suspension, Take 5 mLs (500,000 Units total) by mouth 4 (four) times daily. X 7-10 days, Disp: 473 mL, Rfl: 0   nystatin (MYCOSTATIN/NYSTOP) powder, Apply 1 application topically 2 (two) times daily., Disp: 60 g, Rfl: 11   nystatin cream (MYCOSTATIN), Apply 1 application topically 2 (two) times daily., Disp: 30 g, Rfl: 0   ondansetron (ZOFRAN ODT) 4 MG disintegrating tablet, Take 1 tablet (4 mg total) by mouth every 8 (eight) hours as needed for nausea or vomiting., Disp: 20 tablet, Rfl: 0   OXYGEN, Inhale 2 L into the lungs daily. 2 liters at night, 1 liter when doing activities, and no oxygen when at home during the day., Disp: , Rfl:    potassium chloride (KLOR-CON) 10 MEQ tablet, Take 1 tablet (10 mEq total) by mouth 2 (two) times  daily., Disp: 180 tablet, Rfl: 3   predniSONE (STERAPRED UNI-PAK 21 TAB) 10 MG (21) TBPK tablet, Take as directed in the package, Disp: 21 tablet, Rfl: 0   PROVENTIL HFA 108 (90 Base) MCG/ACT inhaler, Inhale 2 puffs into the lungs every 6 (six) hours as needed for wheezing or shortness of breath. 18 gram inhaler, Disp: 3 each, Rfl: 1   rosuvastatin (CRESTOR) 20 MG tablet, Take 1 tablet (20 mg total) by mouth daily., Disp: 90 tablet, Rfl: 3   sodium chloride (OCEAN) 0.65 % SOLN nasal spray, Place 2 sprays into both nostrils as needed for congestion., Disp: 30 mL, Rfl: 11   vitamin C (ASCORBIC ACID) 500 MG tablet, Take 1,000 mg by mouth daily. , Disp: , Rfl:    Vitamin E 180 MG CAPS, Take 1 capsule by mouth daily., Disp: , Rfl:    gabapentin (NEURONTIN) 100 MG capsule, TAKE 1 CAPSULE BY MOUTH 3 TIMES DAILY, Disp: 90 capsule, Rfl: 3  EXAM: This was a telephone visit and thus no exam was completed.  ASSESSMENT AND PLAN:  Discussed the following assessment and plan:  Problem List Items Addressed This Visit     COPD (chronic obstructive pulmonary disease) with emphysema ([place])    Patient with exacerbation of her COPD.  She will complete the antibiotic and  prednisone per pulmonology.  They have also given her Pulmicort to try.  The patient request a refill on nystatin in case her mouth breaks out relating to the Pulmicort.  This was provided.      DM type 2 (diabetes mellitus, type 2) ([place])    Generally well controlled.  She will continue metformin 1000 mg twice daily.  Check A1c with lab work.      Relevant Orders   Hemoglobin A1c   Lipid panel   Essential hypertension    Well-controlled at pulmonology.  The patient will continue losartan 25 mg once daily and hydrochlorothiazide 12.5 mg daily.  She will come in for lab work in a couple of weeks.      Relevant Orders   Comp Met (CMET)   Other Visit Diagnoses     Thrush       Relevant Medications   nystatin (MYCOSTATIN) 100000 UNIT/ML suspension       Return in about 2 weeks (around 09/12/2021) for labs, 4 months PCP.   I discussed the assessment and treatment plan with the patient. The patient was provided an opportunity to ask questions and all were answered. The patient agreed with the plan and demonstrated an understanding of the instructions.   The patient was advised to call back or seek an in-person evaluation if the symptoms worsen or if the condition fails to improve as anticipated.  I provided 12 minutes of non-face-to-face time during this encounter.   [name], MD

## 2021-08-29 NOTE — Assessment & Plan Note (Signed)
Patient with exacerbation of her COPD.  She will complete the antibiotic and prednisone per pulmonology.  They have also given her Pulmicort to try.  The patient request a refill on nystatin in case her mouth breaks out relating to the Pulmicort.  This was provided.

## 2021-08-31 LAB — ALLERGEN PANEL (27) + IGE
Alternaria Alternata IgE: <0.1 kU/L
Aspergillus Fumigatus IgE: <0.1 kU/L
Bahia Grass IgE: <0.1 kU/L
Bermuda Grass IgE: <0.1 kU/L
Cat Dander IgE: <0.1 kU/L
Cedar, Mountain IgE: <0.1 kU/L
Cladosporium Herbarum IgE: <0.1 kU/L
Cocklebur IgE: <0.1 kU/L
Cockroach, American IgE: <0.1 kU/L
Common Silver Birch IgE: <0.1 kU/L
D Farinae IgE: <0.1 kU/L
D Pteronyssinus IgE: <0.1 kU/L
Dog Dander IgE: <0.1 kU/L
Elm, American IgE: <0.1 kU/L
Hickory, White IgE: <0.1 kU/L
IgE (Immunoglobulin E), Serum: 58 IU/mL (ref 6–495)
Johnson Grass IgE: <0.1 kU/L
Kentucky Bluegrass IgE: <0.1 kU/L
Maple/Box Elder IgE: <0.1 kU/L
Mucor Racemosus IgE: <0.1 kU/L
Oak, White IgE: <0.1 kU/L
Penicillium Chrysogen IgE: <0.1 kU/L
Pigweed, Rough IgE: <0.1 kU/L
Plantain, English IgE: <0.1 kU/L
Ragweed, Short IgE: <0.1 kU/L
Setomelanomma Rostrat: <0.1 kU/L
Timothy Grass IgE: <0.1 kU/L
White Mulberry IgE: <0.1 kU/L

## 2021-09-02 ENCOUNTER — Telehealth: Payer: Self-pay | Admitting: Pulmonary Disease

## 2021-09-02 NOTE — Telephone Encounter (Signed)
Allergen panel was negative   Patient is aware of results and voiced her understanding.  Nothing further needed.

## 2021-09-22 ENCOUNTER — Other Ambulatory Visit: Payer: Self-pay | Admitting: Cardiovascular Disease

## 2021-09-22 DIAGNOSIS — I491 Atrial premature depolarization: Secondary | ICD-10-CM

## 2021-10-03 ENCOUNTER — Telehealth: Payer: Self-pay | Admitting: Family Medicine

## 2021-10-03 ENCOUNTER — Encounter: Payer: Self-pay | Admitting: Pulmonary Disease

## 2021-10-03 ENCOUNTER — Other Ambulatory Visit: Payer: Self-pay

## 2021-10-03 ENCOUNTER — Ambulatory Visit: Payer: Medicare Other | Admitting: Pulmonary Disease

## 2021-10-03 ENCOUNTER — Telehealth: Payer: Self-pay | Admitting: Pharmacy Technician

## 2021-10-03 VITALS — BP 138/90 | HR 88 | Temp 97.0°F | Ht 66.0 in | Wt 252.0 lb

## 2021-10-03 DIAGNOSIS — Z596 Low income: Secondary | ICD-10-CM

## 2021-10-03 DIAGNOSIS — Z87891 Personal history of nicotine dependence: Secondary | ICD-10-CM

## 2021-10-03 DIAGNOSIS — E662 Morbid (severe) obesity with alveolar hypoventilation: Secondary | ICD-10-CM

## 2021-10-03 DIAGNOSIS — J9611 Chronic respiratory failure with hypoxia: Secondary | ICD-10-CM | POA: Diagnosis not present

## 2021-10-03 DIAGNOSIS — J439 Emphysema, unspecified: Secondary | ICD-10-CM

## 2021-10-03 DIAGNOSIS — J9612 Chronic respiratory failure with hypercapnia: Secondary | ICD-10-CM

## 2021-10-03 DIAGNOSIS — J449 Chronic obstructive pulmonary disease, unspecified: Secondary | ICD-10-CM | POA: Diagnosis not present

## 2021-10-03 MED ORDER — PULMICORT FLEXHALER 180 MCG/ACT IN AEPB: 1.0000 | INHALATION_SPRAY | Freq: Two times a day (BID) | RESPIRATORY_TRACT | 11 refills | Status: DC

## 2021-10-03 MED ORDER — BENFOTIAMINE MULTI-B PO CAPS: 1.0000 | ORAL_CAPSULE | Freq: Every day | ORAL | 6 refills | Status: DC

## 2021-10-03 NOTE — Progress Notes (Signed)
[facility])                                            [place] Team    10/03/2021  Martha Woodard Dec 09, 1965 [id number]  Care coordination call placed to [employer] in regard to [facility] application.  Spoke to [employer] who informs patient is re enrolled and APPROVED 09/29/21-09/28/22. She informs to have the prescription re faxed as it came over illegible. She informs once received then the medication will be refilled and shipped out based on last fill date in 2022. Will refax prescription.  Martha Woodard P. Martha Woodard, [place]  [phone]

## 2021-10-03 NOTE — Patient Instructions (Signed)
You can use your Pulmicort 2 puffs twice a day when you are very congested and then once the congestion is controlled you can go to 1 puff twice a day.   We will see her in follow-up in 6 to 8 weeks time call sooner should any new problems arise.

## 2021-10-03 NOTE — Progress Notes (Signed)
Subjective:    Patient ID: Martha Woodard, female    DOB: 12-14-1965, 56 y.o.   MRN: [id number] Chief Complaint  Patient presents with   Follow-up   PROBLEMS: Chronic hypoxic/hypercarbic respiratory failure Former smoker Moderate/severe COPD [facility] CHF Multiple pulmonary nodules - stable > 2 yrs and deemed benign   DATA: 09/28/2015 2D echo: EF 60 to 65%, grade 1 DD, mild left atrial dilation.  Right-sided pressures normal. 02/28/2020 PFTs:FEV1 1.42L or 49%, FVC or 2.24L 60%, FEV1/FVC 63%, no bronchodilator response.  RV is 122% indicating air trapping.  Diffusion capacity normal.  Consistent with moderate to severe COPD. 04/05/2020 chest x-ray: No active disease.  Consistent with COPD, left base scarring/atelectasis.   INTERVAL HISTORY: Last seen by me on 12/01//2022.  At that time she was having a mild flareup treated with prednisone and azithromycin.  She has not had any major exacerbations since then.  Dyspnea, cough at baseline.  No new issues   HPI Martha Woodard is a 56 year old former smoker who presents for follow-up on the issue of COPD with chronic bronchitis.  She has chronic respiratory failure with hypoxia related to her COPD and obesity with obesity hypoventilation syndrome.  This is a scheduled visit.  She has not had any major exacerbations since her last visit.  She had been on Breztri but had to be switched to [facility] due to recurrent issues with thrush.  During the last visit however she continued to have issues with airway inflammation as she does have an asthmatic component.  We reintroduce budesonide in the form of Pulmicort Flexhaler and she has been able to tolerate this well.  She adjust the dose to 2 puffs twice a day when she is very congested and then decreases to 1 puff twice a day when she is stable.  She notes that this is working well for her and so far has not had any issues with thrush.  She is rinsing her mouth well after she uses her Pulmicort Flexhaler.  She does  not endorse any other symptomatology today.  Her dyspnea and cough are at baseline.  Overall she feels well and looks well.   Review of Systems A 10 point review of systems was performed and it is as noted above otherwise negative.  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   Seborrheic dermatitis 10/02/2018   Light headedness 06/17/2018   Foot pain 06/17/2018   Allergic rhinitis 11/09/2017   Venous insufficiency 08/07/2017   Chronic respiratory failure ([place]) 11/13/2016   Skin cyst 08/19/2016   Thyroid nodule 11/28/2015   COPD (chronic obstructive pulmonary disease) with emphysema ([place]) 11/13/2015   Chronic diastolic heart failure ([place]) 11/13/2015   Morbid obesity with BMI of 45.0-49.9, adult ([place]) 10/01/2015   Obesity hypoventilation syndrome ([place]) 10/01/2015   Eczema 10/01/2015   Essential hypertension 10/01/2015   Palpitations 06/26/2014   DM type 2 (diabetes mellitus, type 2) ([place]) 06/26/2014   Hyperlipidemia 06/26/2014   Lung nodule seen on imaging study 02/06/2014   Social History   Tobacco Use   Smoking status: Former    Packs/day: 3.00    Years: 33.00    Pack years: 99.00    Types: Cigarettes    Quit date: 07/10/2013    Years since quitting: 8.2   Smokeless tobacco: Never  Substance Use Topics   Alcohol use:  No    Alcohol/week: 0.0 standard drinks   Allergies  Allergen Reactions   Augmentin [Amoxicillin-Pot Clavulanate] Itching    Hives Can take PCN and Amoxicillin alone    Current Meds  Medication Sig   ACCU-CHEK AVIVA PLUS test strip TEST UP TO 4 TIMES DAILY AS DIRECTED   Accu-Chek Softclix Lancets lancets TEST UP TO 4 TIMES DAILY   acetaminophen (TYLENOL) 650 MG CR tablet Take 650 mg by mouth every 8 (eight) hours as needed for pain.   albuterol (PROVENTIL) (2.5 MG/3ML) 0.083% nebulizer solution Take 3  mLs (2.5 mg total) by nebulization every 6 (six) hours as needed for wheezing or shortness of breath.   aspirin EC 81 MG tablet Take 1 tablet (81 mg total) by mouth daily.   blood glucose meter kit and supplies Dispense based on patient and insurance preference. Use up to four times daily as directed. (FOR ICD-9 250.00, 250.01).   budesonide (PULMICORT) 180 MCG/ACT inhaler Inhale 1 puff into the lungs 2 (two) times daily.   calcium-vitamin D (OSCAL WITH D) 500-200 MG-UNIT tablet Take 1 tablet by mouth 2 (two) times daily.   cetirizine (ZYRTEC) 10 MG tablet Take 10 mg by mouth daily.   fluticasone (FLONASE) 50 MCG/ACT nasal spray USE 1 OR 2 SPRAYS IN EACH NOSTRIL ONCE DAILY.   furosemide (LASIX) 20 MG tablet TAKE 1 TABLET BY MOUTH  DAILY AS NEEDED   gabapentin (NEURONTIN) 100 MG capsule TAKE 1 CAPSULE BY MOUTH 3 TIMES DAILY   Glycopyrrolate-Formoterol (BEVESPI AEROSPHERE) 9-4.8 MCG/ACT AERO Inhale 2 puffs into the lungs 2 (two) times daily.   hydrochlorothiazide (MICROZIDE) 12.5 MG capsule TAKE 1 CAPSULE BY MOUTH  DAILY   ketoconazole (NIZORAL) 2 % cream Apply 1 application topically daily.   ketoconazole (NIZORAL) 2 % shampoo Apply 1 application topically 2 (two) times a week.   losartan (COZAAR) 25 MG tablet TAKE 1 TABLET BY MOUTH  TWICE DAILY   magnesium oxide (MAG-OX) 400 MG tablet Take 400 mg by mouth daily.   metFORMIN (GLUCOPHAGE) 500 MG tablet TAKE 2 TABLETS BY MOUTH  TWICE DAILY WITH MEALS   metoprolol succinate (TOPROL-XL) 25 MG 24 hr tablet TAKE 1 TABLET BY MOUTH  DAILY   montelukast (SINGULAIR) 10 MG tablet TAKE 1 TABLET BY MOUTH AT  BEDTIME   Multiple Vitamin (MULTIVITAMIN) tablet Take 1 tablet by mouth daily.   nystatin (MYCOSTATIN) 100000 UNIT/ML suspension Take 5 mLs (500,000 Units total) by mouth 4 (four) times daily. X 7-10 days   nystatin (MYCOSTATIN/NYSTOP) powder Apply 1 application topically 2 (two) times daily.   nystatin cream (MYCOSTATIN) Apply 1 application topically 2  (two) times daily.   ondansetron (ZOFRAN ODT) 4 MG disintegrating tablet Take 1 tablet (4 mg total) by mouth every 8 (eight) hours as needed for nausea or vomiting.   OXYGEN Inhale 2 L into the lungs daily. 2 liters at night, 1 liter when doing activities, and no oxygen when at home during the day.   potassium chloride (KLOR-CON) 10 MEQ tablet Take 1 tablet (10 mEq total) by mouth 2 (two) times daily.   PROVENTIL HFA 108 (90 Base) MCG/ACT inhaler Inhale 2 puffs into the lungs every 6 (six) hours as needed for wheezing or shortness of breath. 18 gram inhaler   rosuvastatin (CRESTOR) 20 MG tablet TAKE 1 TABLET BY MOUTH  DAILY   sodium chloride (OCEAN) 0.65 % SOLN nasal spray Place 2 sprays into both nostrils as needed for congestion.   vitamin C (ASCORBIC  ACID) 500 MG tablet Take 1,000 mg by mouth daily.    Vitamin E 180 MG CAPS Take 1 capsule by mouth daily.   Immunization History  Administered Date(s) Administered   Influenza,inj,Quad PF,6+ Mos 07/02/2015, 08/19/2016, 08/07/2017, 06/16/2018, 06/29/2019, 07/31/2020, 07/05/2021   Moderna SARS-COV2 Booster Vaccination 11/05/2020   Moderna Sars-Covid-2 Vaccination 03/08/2020, 04/05/2020   Pneumococcal Polysaccharide-23 09/26/2015   Pneumococcal-Unspecified 11/23/2020       Objective:   Physical Exam BP 138/90 (BP Location: Left Arm, Patient Position: Sitting, Cuff Size: Normal)   Pulse 88   Temp (!) 97 F (36.1 C) (Oral)   Ht 5' 6"  (1.676 m)   Wt 252 lb (114.3 kg)   LMP 04/10/2014   SpO2 96%   BMI 40.67 kg/m  GENERAL: Obese woman, no acute distress, comfortable with nasal cannula O2.  Fully ambulatory. HEAD: Normocephalic, atraumatic. EYES: Pupils equal, round, reactive to light.  No scleral icterus. MOUTH: Nose/mouth/throat not examined due to masking requirements for COVID 19. NECK: Supple. No thyromegaly. Trachea midline. No JVD.  No adenopathy. PULMONARY: Mildly diminished breath sounds.  No rhonchi, mild end expiratory wheezing  particularly in the upper lung zones. CARDIOVASCULAR: S1 and S2. Regular rate and rhythm.  No rubs, murmurs or gallops heard. ABDOMEN: Obese, otherwise benign. MUSCULOSKELETAL: No joint deformity, no clubbing, no edema. NEUROLOGIC: No focal deficit, no gait disturbance, speech is fluent. SKIN: Intact,warm,dry. PSYCH: Mood and behavior normal     Assessment & Plan:     ICD-10-CM   1. Asthma-COPD overlap syndrome (HCC)  J44.9    Continue Bevespi and Pulmicort Flexhaler Continue as needed albuterol    2. Chronic respiratory failure with hypoxia and hypercapnia (HCC)  J96.11    J96.12    Continue oxygen supplementation at 1-1/2 L/min Patient is compliant with therapy and notes improvement in symptoms    3. Obesity hypoventilation syndrome ([place])  E66.2    This issue adds complexity to her management Weight loss is recommended    4. Former very heavy cigarette smoker (more than 40 per day)  Z87.891    No evidence of relapse     Meds ordered this encounter  Medications   Budesonide (PULMICORT FLEXHALER) 180 MCG/ACT inhaler    Sig: Inhale 1-2 puffs into the lungs 2 (two) times daily.    Dispense:  1 each    Refill:  11   We will see the patient in follow-up in 6 to 8 weeks time she is to contact us prior to that time should any new difficulties arise.  Renold Don, MD Advanced Bronchoscopy PCCM [facility]    *This note was dictated using voice recognition software/Dragon.  Despite best efforts to proofread, errors can occur which can change the meaning. Any transcriptional errors that result from this process are unintentional and may not be fully corrected at the time of dictation.

## 2021-10-03 NOTE — Telephone Encounter (Signed)
Pt came in office requesting a call back regarding getting on program to get medication fill . Proscription was left in your mail box . Please Advise [phone]

## 2021-10-04 ENCOUNTER — Other Ambulatory Visit: Payer: Self-pay

## 2021-10-04 ENCOUNTER — Telehealth: Payer: Self-pay | Admitting: Family Medicine

## 2021-10-04 DIAGNOSIS — R232 Flushing: Secondary | ICD-10-CM

## 2021-10-04 MED ORDER — GABAPENTIN 100 MG PO CAPS: 100.0000 mg | ORAL_CAPSULE | Freq: Three times a day (TID) | ORAL | 2 refills | Status: DC

## 2021-10-04 NOTE — Telephone Encounter (Signed)
Patient called  returning call  stated she missed a call from Martha Woodard patient to [place]

## 2021-10-04 NOTE — Telephone Encounter (Signed)
Patient called in requesting for Catie to call about another medication to be on program. Pulmicort inhaler 128mcg

## 2021-10-04 NOTE — Telephone Encounter (Signed)
Patient calling in stated that need medication refill gabapentin ( Neurontin) 100MG   90 day supply for the year to be called to   [facility].

## 2021-10-04 NOTE — Telephone Encounter (Signed)
Spoke with patient. Will fax Pulmicort script to [facility] [phone]). They may require an updated application for a new medication, but we will try just the prescription first.   Encouraged patient to call [facility] [phone]) next week to see if they will fill or if they need an application.

## 2021-10-04 NOTE — Telephone Encounter (Signed)
Pt called in requesting to speak with you. Advise Pt that you were with another Pt. Advise pt of prev note. Pt stated that she do understand that the one year supply was sent to [facility]. Pt is requesting for the medication refill to be sent to [facility] because Pt can get the one year supply for free instead of paying copays/deductibles at [facility] for medication refill. Pt requesting callback.

## 2021-10-04 NOTE — Telephone Encounter (Signed)
I spoke with the patient and informed her that I sent the remaining refills to [facility]. And cancelled the RX at [facility].  Lior Hoen,cma

## 2021-10-04 NOTE — Telephone Encounter (Signed)
I called the patient and LVM informing the patient that she has a years supply of gabapentin at [facility].  Sylus Stgermain,cma

## 2021-10-17 MED ORDER — PULMICORT FLEXHALER 180 MCG/ACT IN AEPB: 1.0000 | INHALATION_SPRAY | Freq: Two times a day (BID) | RESPIRATORY_TRACT | 3 refills | Status: DC

## 2021-10-17 NOTE — Telephone Encounter (Signed)
Pt called in stating that she spoke with [facility] and they had advise Pt that they never received the fax. Pt stated that there fax number is [phone]. Pt stated that [facility] had received 3 faxes for medication (budesonide (PULMICORT) 180 MCG/ACT inhaler). Pt stated that they never received the fax for medication (budesonide (PULMICORT FLEXHALER) 180 MCG/ACT inhaler). Pt request callback

## 2021-10-17 NOTE — Telephone Encounter (Signed)
Patient notes [facility] never received the Pulmicort prescription. They received Bevespi, but need Pulmicort electronically prescribed. Sending now under PCP.

## 2021-10-17 NOTE — Addendum Note (Signed)
Addended by: De Hollingshead on: 10/17/2021 04:36 PM   Modules accepted: Orders

## 2021-10-18 ENCOUNTER — Telehealth: Payer: Self-pay | Admitting: Pulmonary Disease

## 2021-10-18 NOTE — Telephone Encounter (Signed)
Received Rx for Pulmicort. Rx placed in Dr. Domingo Dimes folder for signature.

## 2021-10-22 NOTE — Telephone Encounter (Signed)
Rx faxed back to [facility].  Received successful fax confirmation.

## 2021-10-25 NOTE — Telephone Encounter (Signed)
Received shipment confirmation from [facility].

## 2021-11-13 ENCOUNTER — Ambulatory Visit (INDEPENDENT_AMBULATORY_CARE_PROVIDER_SITE_OTHER): Payer: Medicare Other | Admitting: Pharmacist

## 2021-11-13 DIAGNOSIS — I10 Essential (primary) hypertension: Secondary | ICD-10-CM

## 2021-11-13 DIAGNOSIS — E1159 Type 2 diabetes mellitus with other circulatory complications: Secondary | ICD-10-CM

## 2021-11-13 DIAGNOSIS — E782 Mixed hyperlipidemia: Secondary | ICD-10-CM

## 2021-11-13 DIAGNOSIS — J439 Emphysema, unspecified: Secondary | ICD-10-CM

## 2021-11-13 DIAGNOSIS — I5032 Chronic diastolic (congestive) heart failure: Secondary | ICD-10-CM

## 2021-11-13 NOTE — Chronic Care Management (AMB) (Signed)
Chronic Care Management CCM Pharmacy Note  11/13/2021 Name:  Martha Woodard MRN:  [id number] DOB:  10/30/1965  Summary: - Tolerating regimen. Re-approved for Bevespi and Pulmicort assistance   Recommendations/Changes made from today's visit: - Reviewed vaccine recommendations - Consider SGLT2 moving forward  Subjective: Martha Woodard is an 56 y.o. year old female who is a primary patient of Sonnenberg, Angela Adam, MD.  The CCM team was consulted for assistance with disease management and care coordination needs.    Engaged with patient by telephone for follow up visit for pharmacy case management and/or care coordination services.   Objective:  Medications Reviewed Today     Reviewed by De Hollingshead, RPH-CPP (Pharmacist) on 11/13/21 at [phone]  Med List Status: <None>   Medication Order Taking? Sig Documenting Provider Last Dose Status Informant  ACCU-CHEK AVIVA PLUS test strip [id number]  TEST UP TO 4 TIMES DAILY AS DIRECTED Leone Haven, MD  Active   Accu-Chek Softclix Lancets lancets [id number]  TEST UP TO 4 TIMES DAILY Leone Haven, MD  Active   acetaminophen (TYLENOL) 650 MG CR tablet [id number] Yes Take 650 mg by mouth every 8 (eight) hours as needed for pain. [provider] Taking Active            Med Note Darnelle Maffucci, Arville Lime   Wed Apr 24, 2021  2:03 PM)    albuterol (PROVENTIL) (2.5 MG/3ML) 0.083% nebulizer solution [id number] Yes Take 3 mLs (2.5 mg total) by nebulization every 6 (six) hours as needed for wheezing or shortness of breath. McLean-Scocuzza, Nino Glow, MD Taking Active   aspirin EC 81 MG tablet [id number] Yes Take 1 tablet (81 mg total) by mouth daily. Coral Spikes, DO Taking Active Self  blood glucose meter kit and supplies [id number]  Dispense based on patient and insurance preference. Use up to four times daily as directed. (FOR ICD-9 250.00, 250.01). Cook, Jayce G, DO  Active   budesonide (PULMICORT FLEXHALER) 180 MCG/ACT inhaler [id number]  Yes Inhale 1-2 puffs into the lungs 2 (two) times daily. Leone Haven, MD Taking Active   calcium-vitamin D Darron Doom WITH D) 500-200 MG-UNIT tablet [id number] Yes Take 1 tablet by mouth 2 (two) times daily. [provider] Taking Active   cetirizine (ZYRTEC) 10 MG tablet [id number] Yes Take 10 mg by mouth daily. [provider] Taking Active   fluticasone (FLONASE) 50 MCG/ACT nasal spray [id number] Yes USE 1 OR 2 SPRAYS IN EACH NOSTRIL ONCE DAILY. Leone Haven, MD Taking Active Self           Med Note Kelby Aline Apr 24, 2021  2:04 PM)    furosemide (LASIX) 20 MG tablet [id number] Yes TAKE 1 TABLET BY MOUTH  DAILY AS NEEDED Rockey Situ, Kathlene November, MD Taking Active   gabapentin (NEURONTIN) 100 MG capsule [id number] Yes Take 1 capsule (100 mg total) by mouth 3 (three) times daily. Leone Haven, MD Taking Active   Glycopyrrolate-Formoterol (BEVESPI AEROSPHERE) 9-4.8 MCG/ACT Hollie Salk [id number] Yes Inhale 2 puffs into the lungs 2 (two) times daily. Tyler Pita, MD Taking Active   hydrochlorothiazide (MICROZIDE) 12.5 MG capsule [id number] Yes TAKE 1 CAPSULE BY MOUTH  DAILY Gollan, Kathlene November, MD Taking Active   ketoconazole (NIZORAL) 2 % cream [id number] Yes Apply 1 application topically daily. Leone Haven, MD Taking Active   ketoconazole (NIZORAL) 2 % shampoo [id number] Yes Apply 1 application topically 2 (two) times a week. Leone Haven,  MD Taking Active   losartan (COZAAR) 25 MG tablet [id number] Yes TAKE 1 TABLET BY MOUTH  TWICE DAILY Gollan, Kathlene November, MD Taking Active   magnesium oxide (MAG-OX) 400 MG tablet [id number] Yes Take 400 mg by mouth daily. [provider] Taking Active   metFORMIN (GLUCOPHAGE) 500 MG tablet [id number] Yes TAKE 2 TABLETS BY MOUTH  TWICE DAILY WITH MEALS Leone Haven, MD Taking Active   metoprolol succinate (TOPROL-XL) 25 MG 24 hr tablet [id number] Yes TAKE 1 TABLET BY MOUTH  DAILY Rockey Situ, Kathlene November, MD Taking  Active   montelukast (SINGULAIR) 10 MG tablet [id number] Yes TAKE 1 TABLET BY MOUTH AT  BEDTIME Leone Haven, MD Taking Active   Multiple Vitamin (MULTIVITAMIN) tablet [id number] Yes Take 1 tablet by mouth daily. [provider] Taking Active Self  nystatin (MYCOSTATIN) 100000 UNIT/ML suspension [id number]  Take 5 mLs (500,000 Units total) by mouth 4 (four) times daily. X 7-10 days Leone Haven, MD  Active   nystatin (MYCOSTATIN/NYSTOP) powder [id number] Yes Apply 1 application topically 2 (two) times daily. [name], MD Taking Active   nystatin cream (MYCOSTATIN) [id number] Yes Apply 1 application topically 2 (two) times daily. Leone Haven, MD Taking Active            Med Note Coralee Pesa Nov 05, 2020 10:46 AM)    ondansetron (ZOFRAN ODT) 4 MG disintegrating tablet [id number] Yes Take 1 tablet (4 mg total) by mouth every 8 (eight) hours as needed for nausea or vomiting. Earleen Newport, MD Taking Active   OXYGEN [id number] Yes Inhale 2 L into the lungs daily. 2 liters at night, 1 liter when doing activities, and no oxygen when at home during the day. [provider] Taking Active Self  potassium chloride (KLOR-CON) 10 MEQ tablet [id number] Yes Take 1 tablet (10 mEq total) by mouth 2 (two) times daily. Minna Merritts, MD Taking Active   PROVENTIL HFA 108 [phone] Base) MCG/ACT inhaler [id number] Yes Inhale 2 puffs into the lungs every 6 (six) hours as needed for wheezing or shortness of breath. 18 gram inhaler McLean-Scocuzza, Nino Glow, MD Taking Active   rosuvastatin (CRESTOR) 20 MG tablet [id number] Yes TAKE 1 TABLET BY MOUTH  DAILY Gollan, Kathlene November, MD Taking Active   sodium chloride (OCEAN) 0.65 % SOLN nasal spray [id number] Yes Place 2 sprays into both nostrils as needed for congestion. McLean-Scocuzza, Nino Glow, MD Taking Active   vitamin C (ASCORBIC ACID) 500 MG tablet [id number] Yes Take 1,000 mg by mouth daily.  [provider] Taking Active    Vitamin E 180 MG CAPS [id number] Yes Take 1 capsule by mouth daily. [provider] Taking Active             Pertinent Labs:   Lab Results  Component Value Date   HGBA1C 6.9 (H) 05/24/2021   Lab Results  Component Value Date   CHOL 99 08/22/2020   HDL 39.70 08/22/2020   LDLCALC 33 08/22/2020   LDLDIRECT 57.0 07/27/2018   TRIG 131.0 08/22/2020   CHOLHDL 2 08/22/2020   Lab Results  Component Value Date   CREATININE 0.57 02/20/2021   BUN 11 02/20/2021   NA 141 02/20/2021   K 4.6 02/20/2021   CL 97 02/20/2021   CO2 37 (H) 02/20/2021    SDOH:  (Social Determinants of Health) assessments and interventions performed:    [place]  Review of patient past medical history, allergies, medications, health  status, including review of consultants reports, laboratory and other test data, was performed as part of comprehensive evaluation and provision of chronic care management services.   Care Plan : Medication Management  Updates made by De Hollingshead, RPH-CPP since 11/13/2021 12:00 AM     Problem: Diabetes, COPD, HTN, CHF      Goal: Disease Progression Prevention   Start Date: 10/31/2020  Recent Progress: On track  Priority: High  Note:   Current Barriers:  Unable to independently afford treatment regimen Complex patient with multiple comorbidities at high risk for exacerbation   Pharmacist Clinical Goal(s):  Over the next 90 days, patient will verbalize ability to afford treatment regimen Over the next 90 days, patient will achieve adherence to monitoring guidelines and medication adherence to achieve therapeutic efficacy through collaboration with PharmD and provider.   Interventions: 1:1 collaboration with Leone Haven, MD regarding development and update of comprehensive plan of care as evidenced by provider attestation and co-signature Inter-disciplinary care team collaboration (see longitudinal plan of care) Comprehensive medication  review performed; medication list updated in electronic medical record  Health Maintenance   Yearly diabetic eye exam: up to date Yearly diabetic foot exam: up to date Urine microalbumin: up to date Yearly influenza vaccination: up to date Td/Tdap vaccination: up to date Pneumonia vaccination: up to date  COVID vaccinations: due - recommended to pursue bivalent booster, discussed today Shingrix vaccinations: due - recommended to pursue, discussed today Overdue for lab work and PCP follow up. Scheduled.  Diabetes: Controlled per A1c; current treatment: metformin 1000 mg BID  Previously discussed GLP1 for weight assistance, but patient declined [place] to work up thyroid nodules.  Discussed SGLT2 in light of dual CHF/DM. Encouraged to discuss w/ PCP and Dr. Rockey Situ moving forward. Patient would qualify for assistance for Farxiga through [employer].   Heart Failure, preserved:  Appropriately managed; follows w/ Dr. Rockey Situ ARNI/ACEi/ARB: losartan 25 mg BID Beta blocker: metoprolol succinate 25 mg  SGLT2: none, consider moving forward Mineralocorticoid Receptor Antagonist: none, consider moving forward Diuretic: furosemide 20 mg PRN, potassium 10 mEq BID,  Current home vitals: reports dentist reading yesterday was 124/82  Confirms understanding of importance of weighing daily. Confirms understanding to contact cardiology/primary care team if weight gain >3 lbs in 1 day or >5 lbs in 1 week Encouraged to check home BP readings between now and Dr. Donivan Scull appointment Discussed benefit of SGLT2 as above. Discussed SGLT2 in light of dual CHF/DM. Encouraged to discuss w/ PCP and Dr. Rockey Situ moving forward. Patient would qualify for assistance for Farxiga through [employer].   Hyperlipidemia and ASCVD risk reduction: Controlled per last lipid panel; current treatment: rosuvastatin 20 mg daily;  Antiplatelet regimen: aspirin 81 mg daily Recommended to continue current regimen at this time. Lipid  panel scheduled  Chronic Obstructive Pulmonary Disease; Chronic Sinus Congestion often leading to COPD exacerbation: Controlled COPD, sinus symptoms; current treatment: Bevespi 9/4.8 mcg 2 puffs BID, albuterol HFA PRN, albuterol nebulizer PRN, budesonide nebulizer PRN; chronic O2 use. Allergy regimen: cetirizine 10 mg daily, fluticasone intranasal both nostrils BID, montelukast 10 mg daily, nasal saline spray continues to be beneficial in clearing out sinuses to reduce frequency of sinus infections; follows w/ Dr. Patsey Berthold Approved for assistnace  Reports more nasal/head congestion recently. More productive cough.  Unable to utilize Trelegy, [employer]- use of inhaled corticosteroid resulted in mouth irritation, even if she rinsed her mouth out well after use.  Approved for Bevespi and Pulmicort assistance through 09/28/22 GOLD Classification:  D Encouraged to continue current regimen at this time  Hot Flashes: Improved; gabapentin 100 mg TID PRN - taking BID Reports some dizziness ~ 1 hour after taking medication. Denies falls. Continue to monitor. Reports benefit outweighs this side effect at this time  Supplements: Vitamin C, Vitamin D + Calcium, Vitamin E, zinc  Patient Goals/Self-Care Activities Over the next 90 days, patient will:  - take medications as prescribed check glucose periodically, document, and provide at future appointments check blood pressure periodically, document, and provide at future appointments collaborate with provider on medication access solutions       Plan: Telephone follow up appointment with care management team member scheduled for:  6 months  [name], PharmD, [place], CPP Clinical Pharmacist [employer] [facility] [employer] [phone]

## 2021-11-13 NOTE — Patient Instructions (Addendum)
Jase,   It was great talking to you today!  We recommend the Shingrix (shingles) vaccine series for all over age 58. It should have a $0 copay on all Medicare plans this year. You can pursue this without a prescription at your local pharmacy, or feel free to call our [place] at [facility] at [phone].  We recommend you get the updated bivalent COVID-19 booster, at least 2 months after any prior doses. You may consider delaying a booster dose by 3 months from a prior episode of COVID-19 per the CDC. Our Outpatient Pharmacy also has this medication.   Talk to Dr. Rockey Situ about [facility] Glade - if he would like you to be on this medication for heart failure (as well as diabetes), you could get it for free from [employer] as well.   Take care!  [name], PharmD    Visit Information  Following are the goals we discussed today:  Patient Goals/Self-Care Activities Over the next 90 days, patient will:  - take medications as prescribed check glucose periodically, document, and provide at future appointments check blood pressure periodically, document, and provide at future appointments collaborate with provider on medication access solutions          Plan: Telephone follow up appointment with care management team member scheduled for:  6 months   [name], PharmD, [place], CPP Clinical Pharmacist [place] at [facility] [phone]   Please call the care guide team at [phone] if you need to cancel or reschedule your appointment.   The patient verbalized understanding of instructions, educational materials, and care plan provided today and agreed to receive a mailed copy of patient instructions, educational materials, and care plan.

## 2021-11-14 ENCOUNTER — Other Ambulatory Visit: Payer: Self-pay

## 2021-11-14 ENCOUNTER — Encounter: Payer: Self-pay | Admitting: Pulmonary Disease

## 2021-11-14 ENCOUNTER — Ambulatory Visit: Payer: Medicare Other | Admitting: Pulmonary Disease

## 2021-11-14 VITALS — BP 122/82 | HR 91 | Temp 99.5°F | Ht 66.0 in | Wt 254.0 lb

## 2021-11-14 DIAGNOSIS — J0111 Acute recurrent frontal sinusitis: Secondary | ICD-10-CM

## 2021-11-14 DIAGNOSIS — J9611 Chronic respiratory failure with hypoxia: Secondary | ICD-10-CM

## 2021-11-14 DIAGNOSIS — J449 Chronic obstructive pulmonary disease, unspecified: Secondary | ICD-10-CM

## 2021-11-14 DIAGNOSIS — E662 Morbid (severe) obesity with alveolar hypoventilation: Secondary | ICD-10-CM

## 2021-11-14 DIAGNOSIS — J9612 Chronic respiratory failure with hypercapnia: Secondary | ICD-10-CM

## 2021-11-14 DIAGNOSIS — Z87891 Personal history of nicotine dependence: Secondary | ICD-10-CM | POA: Diagnosis not present

## 2021-11-14 MED ORDER — AZITHROMYCIN 250 MG PO TABS: ORAL_TABLET | ORAL | 0 refills | Status: AC

## 2021-11-14 NOTE — Progress Notes (Signed)
Subjective:    Patient ID: Martha Woodard, female    DOB: [birth year], 56 y.o.   MRN: [id number] Chief Complaint  Patient presents with   Follow-up  PROBLEMS: Chronic hypoxic/hypercarbic respiratory failure Former smoker Moderate/severe COPD OHS CHF Multiple pulmonary nodules - stable > 2 yrs and deemed benign   DATA: 09/28/2015 2D echo: EF 60 to 65%, grade 1 DD, mild left atrial dilation.  Right-sided pressures normal. 02/28/2020 PFTs:FEV1 1.42L or 49%, FVC or 2.24L 60%, FEV1/FVC 63%, no bronchodilator response.  RV is 122% indicating air trapping.  Diffusion capacity normal.  Consistent with moderate to severe COPD. 04/05/2020 chest x-ray: No active disease.  Consistent with COPD, left base scarring/atelectasis.   INTERVAL HISTORY: Last seen by me on 03 October 2021. No major exacerbations since then.  She does report increased nasal congestion and yellow/green nasal discharge over the last week.  No fever.  Cough controlled with Pulmicort.   HPI Martha Woodard is a 56 year old former smoker who presents for follow-up on the issue of asthma with COPD overlap syndrome.  Previously did not tolerate inhaled corticosteroid on Breztri due to thrush.  She is maintained on Bevespi, we did add Pulmicort Flexhaler during her last visit to see if this would control her cough and bronchospasm.  She has been able to tolerate the Pulmicort Flexhaler without flares of thrush.  She adjust her dose according to how congested she feels.  She can do 2 puffs twice a day or 1 puff twice a day.  She notes that her cough has been totally controlled since on the Pulmicort.  She continues to have chronic issues with rhinosinusitis.  Appears to have another flareup of it with nasal discharge that is purulent and some anterior frontal sinus pain.  She is compliant with oxygen therapy for chronic hypoxic respiratory failure and obesity with obesity hypoventilation syndrome.  She has not had any fevers, chills or sweats.  No  purulent sputum production. Overall she feels well and looks well.   Review of Systems A 10 point review of systems was performed and it is as noted above otherwise negative.  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   Seborrheic dermatitis 10/02/2018   Light headedness 06/17/2018   Foot pain 06/17/2018   Allergic rhinitis 11/09/2017   Venous insufficiency 08/07/2017   Chronic respiratory failure ([place]) 11/13/2016   Skin cyst 08/19/2016   Thyroid nodule 11/28/2015   COPD (chronic obstructive pulmonary disease) with emphysema ([place]) 11/13/2015   Chronic diastolic heart failure ([place]) 11/13/2015   Morbid obesity with BMI of 45.0-49.9, adult ([place]) 10/01/2015   Obesity hypoventilation syndrome ([place]) 10/01/2015   Eczema 10/01/2015   Essential hypertension 10/01/2015   Palpitations 06/26/2014   DM type 2 (diabetes mellitus, type 2) ([place]) 06/26/2014   Hyperlipidemia 06/26/2014   Lung nodule seen on imaging study 02/06/2014   Social History   Tobacco Use   Smoking status: Former    Packs/day: 3.00    Years: 33.00    Pack years: 99.00    Types: Cigarettes    Quit date: 07/10/2013    Years since quitting: 8.3   Smokeless tobacco: Never  Substance Use Topics   Alcohol use: No    Alcohol/week: 0.0 standard drinks   Allergies  Allergen Reactions   Augmentin [Amoxicillin-Pot Clavulanate] Itching    Hives Can take  PCN and Amoxicillin alone    Current Meds  Medication Sig   ACCU-CHEK AVIVA PLUS test strip TEST UP TO 4 TIMES DAILY AS DIRECTED   Accu-Chek Softclix Lancets lancets TEST UP TO 4 TIMES DAILY   acetaminophen (TYLENOL) 650 MG CR tablet Take 650 mg by mouth every 8 (eight) hours as needed for pain.   albuterol (PROVENTIL) (2.5 MG/3ML) 0.083% nebulizer solution Take 3 mLs (2.5 mg total) by nebulization every 6 (six) hours as  needed for wheezing or shortness of breath.   aspirin EC 81 MG tablet Take 1 tablet (81 mg total) by mouth daily.   azithromycin (ZITHROMAX) 250 MG tablet Take 2 tablets (500 mg) on  Day 1,  followed by 1 tablet (250 mg) once daily on Days 2 through 5.   blood glucose meter kit and supplies Dispense based on patient and insurance preference. Use up to four times daily as directed. (FOR ICD-9 250.00, 250.01).   budesonide (PULMICORT FLEXHALER) 180 MCG/ACT inhaler Inhale 1-2 puffs into the lungs 2 (two) times daily.   calcium-vitamin D (OSCAL WITH D) 500-200 MG-UNIT tablet Take 1 tablet by mouth 2 (two) times daily.   cetirizine (ZYRTEC) 10 MG tablet Take 10 mg by mouth daily.   fluticasone (FLONASE) 50 MCG/ACT nasal spray USE 1 OR 2 SPRAYS IN EACH NOSTRIL ONCE DAILY.   furosemide (LASIX) 20 MG tablet TAKE 1 TABLET BY MOUTH  DAILY AS NEEDED   gabapentin (NEURONTIN) 100 MG capsule Take 1 capsule (100 mg total) by mouth 3 (three) times daily.   Glycopyrrolate-Formoterol (BEVESPI AEROSPHERE) 9-4.8 MCG/ACT AERO Inhale 2 puffs into the lungs 2 (two) times daily.   hydrochlorothiazide (MICROZIDE) 12.5 MG capsule TAKE 1 CAPSULE BY MOUTH  DAILY   ketoconazole (NIZORAL) 2 % cream Apply 1 application topically daily.   ketoconazole (NIZORAL) 2 % shampoo Apply 1 application topically 2 (two) times a week.   losartan (COZAAR) 25 MG tablet TAKE 1 TABLET BY MOUTH  TWICE DAILY   magnesium oxide (MAG-OX) 400 MG tablet Take 400 mg by mouth daily.   metFORMIN (GLUCOPHAGE) 500 MG tablet TAKE 2 TABLETS BY MOUTH  TWICE DAILY WITH MEALS   metoprolol succinate (TOPROL-XL) 25 MG 24 hr tablet TAKE 1 TABLET BY MOUTH  DAILY   montelukast (SINGULAIR) 10 MG tablet TAKE 1 TABLET BY MOUTH AT  BEDTIME   Multiple Vitamin (MULTIVITAMIN) tablet Take 1 tablet by mouth daily.   nystatin (MYCOSTATIN) 100000 UNIT/ML suspension Take 5 mLs (500,000 Units total) by mouth 4 (four) times daily. X 7-10 days   nystatin (MYCOSTATIN/NYSTOP)  powder Apply 1 application topically 2 (two) times daily.   nystatin cream (MYCOSTATIN) Apply 1 application topically 2 (two) times daily.   ondansetron (ZOFRAN ODT) 4 MG disintegrating tablet Take 1 tablet (4 mg total) by mouth every 8 (eight) hours as needed for nausea or vomiting.   OXYGEN Inhale 2 L into the lungs daily. 2 liters at night, 1 liter when doing activities, and no oxygen when at home during the day.   potassium chloride (KLOR-CON) 10 MEQ tablet Take 1 tablet (10 mEq total) by mouth 2 (two) times daily.   PROVENTIL HFA 108 (90 Base) MCG/ACT inhaler Inhale 2 puffs into the lungs every 6 (six) hours as needed for wheezing or shortness of breath. 18 gram inhaler   rosuvastatin (CRESTOR) 20 MG tablet TAKE 1 TABLET BY MOUTH  DAILY   sodium chloride (OCEAN) 0.65 % SOLN nasal spray Place 2 sprays into both nostrils  as needed for congestion.   vitamin C (ASCORBIC ACID) 500 MG tablet Take 1,000 mg by mouth daily.    Vitamin E 180 MG CAPS Take 1 capsule by mouth daily.   Immunization History  Administered Date(s) Administered   Influenza,inj,Quad PF,6+ Mos 07/02/2015, 08/19/2016, 08/07/2017, 06/16/2018, 06/29/2019, 07/31/2020, 07/05/2021   Moderna SARS-COV2 Booster Vaccination 11/05/2020   Moderna Sars-Covid-2 Vaccination 03/08/2020, 04/05/2020   Pneumococcal Polysaccharide-23 09/26/2015   Pneumococcal-Unspecified 11/23/2020     Objective:   Physical Exam BP 122/82 (BP Location: Left Arm, Patient Position: Sitting, Cuff Size: Normal)    Pulse 91    Temp 99.5 F (37.5 C) (Oral)    Ht 5' 6"  (1.676 m)    Wt 254 lb (115.2 kg)    LMP 04/10/2014    SpO2 91%    BMI 41.00 kg/m  GENERAL: Obese woman, no acute distress, comfortable with nasal cannula O2.  Fully ambulatory. HEAD: Normocephalic, atraumatic. EYES: Pupils equal, round, reactive to light.  No scleral icterus. MOUTH: Nose/mouth/throat not examined due to masking requirements for COVID 19. NECK: Supple. No thyromegaly. Trachea  midline. No JVD.  No adenopathy. PULMONARY: Mildly diminished breath sounds.  No adventitious sounds, lungs sound clear.  CARDIOVASCULAR: S1 and S2. Regular rate and rhythm.  No rubs, murmurs or gallops heard. ABDOMEN: Obese, otherwise benign. MUSCULOSKELETAL: No joint deformity, no clubbing, no edema. NEUROLOGIC: No focal deficit, no gait disturbance, speech is fluent. SKIN: Intact,warm,dry. PSYCH: Mood and behavior normal     Assessment & Plan:     ICD-10-CM   1. Asthma-COPD overlap syndrome (HCC)  J44.9    Continue Bevespi and Pulmicort Flexhaler Continue as needed albuterol    2. Chronic respiratory failure with hypoxia and hypercapnia (HCC)  J96.11    J96.12    Continue oxygen supplementation at 1 L/min    3. Acute recurrent frontal sinusitis  J01.11    Azithromycin pack    4. Obesity hypoventilation syndrome (HCC)  E66.2    Recommend weight loss Continue supplemental oxygen    5. Former very heavy cigarette smoker (more than 40 per day)  Z87.891    No evidence of relapse Will need lung cancer screening enrollment     Orders Placed This Encounter  Procedures   Ambulatory Referral for Lung Cancer Scre    Referral Priority:   Routine    Referral Type:   Consultation    Referral Reason:   Specialty Services Required    Number of Visits Requested:   1   Meds ordered this encounter  Medications   azithromycin (ZITHROMAX) 250 MG tablet    Sig: Take 2 tablets (500 mg) on  Day 1,  followed by 1 tablet (250 mg) once daily on Days 2 through 5.    Dispense:  6 each    Refill:  0   We will see the patient in follow-up in 3 months time she is to contact us prior to that time should any new difficulties arise.   Renold Don, MD Advanced Bronchoscopy PCCM [facility]    *This note was dictated using voice recognition software/Dragon.  Despite best efforts to proofread, errors can occur which can change the meaning. Any transcriptional errors that  result from this process are unintentional and may not be fully corrected at the time of dictation.

## 2021-11-14 NOTE — Patient Instructions (Signed)
Your lungs sounded very clear today.  We sent in an antibiotic for your sinuses to [facility].  We will see you in follow-up in 3 months time call sooner should any new problems arise.

## 2021-11-26 ENCOUNTER — Other Ambulatory Visit: Payer: Self-pay

## 2021-11-26 ENCOUNTER — Other Ambulatory Visit (INDEPENDENT_AMBULATORY_CARE_PROVIDER_SITE_OTHER): Payer: Medicare Other

## 2021-11-26 DIAGNOSIS — I10 Essential (primary) hypertension: Secondary | ICD-10-CM

## 2021-11-26 DIAGNOSIS — J439 Emphysema, unspecified: Secondary | ICD-10-CM

## 2021-11-26 DIAGNOSIS — E1159 Type 2 diabetes mellitus with other circulatory complications: Secondary | ICD-10-CM | POA: Diagnosis not present

## 2021-11-26 DIAGNOSIS — I5032 Chronic diastolic (congestive) heart failure: Secondary | ICD-10-CM

## 2021-11-26 DIAGNOSIS — E782 Mixed hyperlipidemia: Secondary | ICD-10-CM | POA: Diagnosis not present

## 2021-11-26 LAB — COMPREHENSIVE METABOLIC PANEL
ALT: 37 U/L — ABNORMAL HIGH (ref 0–35)
AST: 33 U/L (ref 0–37)
Albumin: 4.3 g/dL (ref 3.5–5.2)
Alkaline Phosphatase: 53 U/L (ref 39–117)
BUN: 13 mg/dL (ref 6–23)
CO2: 33 mEq/L — ABNORMAL HIGH (ref 19–32)
Calcium: 9.7 mg/dL (ref 8.4–10.5)
Chloride: 101 mEq/L (ref 96–112)
Creatinine, Ser: 0.67 mg/dL (ref 0.40–1.20)
GFR: 97.17 mL/min (ref >60.00)
Glucose, Bld: 112 mg/dL — ABNORMAL HIGH (ref 70–99)
Potassium: 4.1 mEq/L (ref 3.5–5.1)
Sodium: 142 mEq/L (ref 135–145)
Total Bilirubin: 0.5 mg/dL (ref 0.2–1.2)
Total Protein: 6.6 g/dL (ref 6.0–8.3)

## 2021-11-26 LAB — LIPID PANEL
Cholesterol: 109 mg/dL (ref 0–200)
HDL: 45.9 mg/dL (ref >39.00)
LDL Cholesterol: 40 mg/dL (ref 0–99)
NonHDL: 63.09
Total CHOL/HDL Ratio: 2
Triglycerides: 114 mg/dL (ref 0.0–149.0)
VLDL: 22.8 mg/dL (ref 0.0–40.0)

## 2021-11-26 LAB — HEMOGLOBIN A1C: Hgb A1c MFr Bld: 6.9 % — ABNORMAL HIGH (ref 4.6–6.5)

## 2021-12-01 NOTE — Progress Notes (Signed)
Date:  12/02/2021   ID:  Martha, Woodard 10/19/1965, MRN [id number]  Patient Location:  [place] [address]   Provider location:   [facility], [place] office  PCP:  Martha Haven, MD  Cardiologist:  Arvid Right [facility]  Chief Complaint  Patient presents with   1 year follow up     Patient c/o chest pain about 2 weeks ago and has shortness of breath most days with exertion. Medications reviewed by the patient verbally.     History of Present Illness:    Martha Woodard is a 56 y.o. female  past medical history of diabetes,  asthma,  COPD, Quit smoking 2014, smoked for >30 yrs lung nodules  hospital for acute respiratory distress, 20 pound weight gain, hypoxia, diagnosed with COPD exacerbation and acute diastolic CHF  Chronic LE edema>L obesity hypoventilation Ejection fraction 60% in December 2016 She presents for follow-up of her diastolic CHF  Last seen by myself in clinic 2/22  Having copd exercaerbation Choked on nystin swallow, inflammed throat, could not breath well Difficult time recovering that night, has since made recovery  Sedentary, limited by underlying COPD Denies significant leg swelling Takes Lasix as needed, sparingly  Labs reviewed A1C: 6.9 Total chol 109, LDL 40 CR 0.67   stopped smoking 06/2013  EKG personally reviewed by myself on todays visit Normal sinus rhythm rate 70 bpm no significant ST-T wave changes  Other past medical hx COVID-19 + December 2020 Sore throat, coughing, some shortness of breath, had to use oxygen at times. Felt like bronchiits Sinus infection has persisted No fever currently PMD called prednisone, may need abx  Prior episode of hypoxic respiratory distress Previous CT chest  Bilateral lower lobe atelectasis, pulmonary edema versus interstitial pneumonitis superimposed on changes of COPD and right lung nodules    initiated on steroids, antibiotics, inhalation  therapy as well as diuretics.     hypoxic intermittently, especially at nighttime and sitting in the bed, concerning for obesity hypoventilation syndrome.     Past Medical History:  Diagnosis Date   CHF (congestive heart failure) ([place])    Colon polyp 01-01-2016   COPD (chronic obstructive pulmonary disease) (HCC)    Diabetes mellitus without complication (HCC)    Diastolic heart failure (HCC)    HLD (hyperlipidemia)    Hypertension    Morbid obesity with BMI of 45.0-49.9, adult ([place])    Multiple lung nodules on CT    Shortness of breath dyspnea    Past Surgical History:  Procedure Laterality Date   BIOPSY THYROID Right 02-06-14   NEGATIVE FOR MALIGNANT CELLS proteinaceous material and macrophages.   BREAST BIOPSY Left    neg- core   COLONOSCOPY WITH PROPOFOL N/A 01/01/2016   Procedure: COLONOSCOPY WITH PROPOFOL;  Surgeon: Lucilla Lame, MD;  Location: [facility] ENDOSCOPY;  Service: Endoscopy;  Laterality: N/A;   thryoid fna Right April 2015   Proteinaceous material and macrophages.   WISDOM TOOTH EXTRACTION      Allergies:   Augmentin [amoxicillin-pot clavulanate]   Social History   Tobacco Use   Smoking status: Former    Packs/day: 3.00    Years: 33.00    Pack years: 99.00    Types: Cigarettes    Quit date: 07/10/2013    Years since quitting: 8.4   Smokeless tobacco: Never  Vaping Use   Vaping Use: Former  Substance Use Topics   Alcohol use: No  Alcohol/week: 0.0 standard drinks   Drug use: No     Current Outpatient Medications on File Prior to Visit  Medication Sig Dispense Refill   ACCU-CHEK AVIVA PLUS test strip TEST UP TO 4 TIMES DAILY AS DIRECTED 200 strip 6   Accu-Chek Softclix Lancets lancets TEST UP TO 4 TIMES DAILY 400 each 3   acetaminophen (TYLENOL) 650 MG CR tablet Take 650 mg by mouth every 8 (eight) hours as needed for pain.     albuterol (PROVENTIL) (2.5 MG/3ML) 0.083% nebulizer solution Take 3 mLs (2.5 mg total) by nebulization every 6 (six) hours as  needed for wheezing or shortness of breath. 75 mL 12   aspirin EC 81 MG tablet Take 1 tablet (81 mg total) by mouth daily. 90 tablet 3   blood glucose meter kit and supplies Dispense based on patient and insurance preference. Use up to four times daily as directed. (FOR ICD-9 250.00, 250.01). 1 each 0   budesonide (PULMICORT FLEXHALER) 180 MCG/ACT inhaler Inhale 1-2 puffs into the lungs 2 (two) times daily. 3 each 3   calcium-vitamin D (OSCAL WITH D) 500-200 MG-UNIT tablet Take 1 tablet by mouth 2 (two) times daily.     cetirizine (ZYRTEC) 10 MG tablet Take 10 mg by mouth daily.     fluticasone (FLONASE) 50 MCG/ACT nasal spray USE 1 OR 2 SPRAYS IN EACH NOSTRIL ONCE DAILY. 16 g 12   gabapentin (NEURONTIN) 100 MG capsule Take 1 capsule (100 mg total) by mouth 3 (three) times daily. 90 capsule 2   Glycopyrrolate-Formoterol (BEVESPI AEROSPHERE) 9-4.8 MCG/ACT AERO Inhale 2 puffs into the lungs 2 (two) times daily. 10.7 g 11   hydrochlorothiazide (MICROZIDE) 12.5 MG capsule TAKE 1 CAPSULE BY MOUTH  DAILY 90 capsule 0   ketoconazole (NIZORAL) 2 % cream Apply 1 application topically daily. 15 g 0   ketoconazole (NIZORAL) 2 % shampoo Apply 1 application topically 2 (two) times a week. 120 mL 0   losartan (COZAAR) 25 MG tablet TAKE 1 TABLET BY MOUTH  TWICE DAILY 180 tablet 0   magnesium oxide (MAG-OX) 400 MG tablet Take 400 mg by mouth daily.     metFORMIN (GLUCOPHAGE) 500 MG tablet TAKE 2 TABLETS BY MOUTH  TWICE DAILY WITH MEALS 360 tablet 3   metoprolol succinate (TOPROL-XL) 25 MG 24 hr tablet TAKE 1 TABLET BY MOUTH  DAILY 90 tablet 0   montelukast (SINGULAIR) 10 MG tablet TAKE 1 TABLET BY MOUTH AT  BEDTIME 30 tablet 11   Multiple Vitamin (MULTIVITAMIN) tablet Take 1 tablet by mouth daily.     nystatin (MYCOSTATIN) 100000 UNIT/ML suspension Take 5 mLs (500,000 Units total) by mouth 4 (four) times daily. X 7-10 days 473 mL 0   nystatin (MYCOSTATIN/NYSTOP) powder Apply 1 application topically 2 (two) times  daily. 60 g 11   nystatin cream (MYCOSTATIN) Apply 1 application topically 2 (two) times daily. 30 g 0   ondansetron (ZOFRAN ODT) 4 MG disintegrating tablet Take 1 tablet (4 mg total) by mouth every 8 (eight) hours as needed for nausea or vomiting. 20 tablet 0   OXYGEN Inhale 2 L into the lungs daily. 2 liters at night, 1 liter when doing activities, and no oxygen when at home during the day.     potassium chloride (KLOR-CON) 10 MEQ tablet Take 1 tablet (10 mEq total) by mouth 2 (two) times daily. 180 tablet 3   PROVENTIL HFA 108 (90 Base) MCG/ACT inhaler Inhale 2 puffs into the lungs every 6 (  six) hours as needed for wheezing or shortness of breath. 18 gram inhaler 3 each 1   rosuvastatin (CRESTOR) 20 MG tablet TAKE 1 TABLET BY MOUTH  DAILY 90 tablet 0   sodium chloride (OCEAN) 0.65 % SOLN nasal spray Place 2 sprays into both nostrils as needed for congestion. 30 mL 11   vitamin C (ASCORBIC ACID) 500 MG tablet Take 1,000 mg by mouth daily.      Vitamin E 180 MG CAPS Take 1 capsule by mouth daily.     No current facility-administered medications on file prior to visit.     Family Hx: The patient's family history includes Breast cancer (age of onset: 63) in her sister; Breast cancer (age of onset: 46) in her sister; Cancer (age of onset: 94) in her mother; Goiter in her mother; Heart murmur in her sister; Stroke in her brother and father.  ROS:   Please see the history of present illness.    Review of Systems  Constitutional: Negative.   HENT: Negative.    Respiratory:  Positive for shortness of breath.   Cardiovascular: Negative.   Gastrointestinal: Negative.   Musculoskeletal: Negative.   Neurological: Negative.   Psychiatric/Behavioral: Negative.    All other systems reviewed and are negative.   Labs/Other Tests and Data Reviewed:    Recent Labs: 05/24/2021: Hemoglobin 14.3; Platelets 260.0; TSH 0.90 11/26/2021: ALT 37; BUN 13; Creatinine, Ser 0.67; Potassium 4.1; Sodium 142    Recent Lipid Panel Lab Results  Component Value Date/Time   CHOL 109 11/26/2021 09:55 AM   TRIG 114.0 11/26/2021 09:55 AM   HDL 45.90 11/26/2021 09:55 AM   CHOLHDL 2 11/26/2021 09:55 AM   LDLCALC 40 11/26/2021 09:55 AM   LDLDIRECT 57.0 07/27/2018 09:20 AM    Wt Readings from Last 3 Encounters:  12/02/21 251 lb 8 oz (114.1 kg)  11/14/21 254 lb (115.2 kg)  10/03/21 252 lb (114.3 kg)     Exam:    Vital Signs: Vital signs may also be detailed in the HPI BP 130/70 (BP Location: Left Arm, Patient Position: Sitting, Cuff Size: Large)    Pulse 70    Ht 5' 6"  (1.676 m)    Wt 251 lb 8 oz (114.1 kg)    LMP 04/10/2014    SpO2 98%    BMI 40.59 kg/m   Constitutional:  oriented to person, place, and time. No distress.  HENT:  Head: Grossly normal Eyes:  no discharge. No scleral icterus.  Neck: No JVD, no carotid bruits  Cardiovascular: Regular rate and rhythm, no murmurs appreciated Pulmonary/Chest: Clear to auscultation bilaterally, no wheezes or rails Abdominal: Soft.  no distension.  no tenderness.  Musculoskeletal: Normal range of motion Neurological:  normal muscle tone. Coordination normal. No atrophy Skin: Skin warm and dry Psychiatric: normal affect, pleasant   ASSESSMENT & PLAN:    Problem List Items Addressed This Visit       Cardiology Problems   Essential hypertension   Relevant Medications   furosemide (LASIX) 20 MG tablet   Other Relevant Orders   EKG 12-Lead   Chronic diastolic heart failure (HCC) - Primary   Relevant Medications   furosemide (LASIX) 20 MG tablet   Hyperlipidemia   Relevant Medications   furosemide (LASIX) 20 MG tablet     Other   DM type 2 (diabetes mellitus, type 2) (HCC)   COPD (chronic obstructive pulmonary disease) with emphysema (HCC)   Obesity hypoventilation syndrome ([place])   Other Visit Diagnoses  PAC (premature atrial contraction)       Relevant Medications   furosemide (LASIX) 20 MG tablet       COPD: Stopped  smoking [place] on oxygen, no recent COPD exacerbations but does have flareups Followed by pulmonary  Obesity hypoventilation: Recommended calorie restriction, unable to exercise, weight well above ideal baseline  Diastolic CHF Taking Lasix as needed On low-dose HCTZ daily Denies leg swelling, feels her breathing is stable  Diabetes:  HBA1C 6.9 Calorie restriction recommended   Total encounter time more than 30 minutes  Greater than 50% was spent in counseling and coordination of care with the patient   Signed, Ida Rogue, [place] [facility] [id number], [place], [place] [address]

## 2021-12-02 ENCOUNTER — Other Ambulatory Visit: Payer: Self-pay

## 2021-12-02 ENCOUNTER — Encounter: Payer: Self-pay | Admitting: Cardiovascular Disease

## 2021-12-02 ENCOUNTER — Ambulatory Visit: Payer: Medicare Other | Admitting: Cardiovascular Disease

## 2021-12-02 VITALS — BP 130/70 | HR 70 | Ht 66.0 in | Wt 251.5 lb

## 2021-12-02 DIAGNOSIS — E782 Mixed hyperlipidemia: Secondary | ICD-10-CM

## 2021-12-02 DIAGNOSIS — E1159 Type 2 diabetes mellitus with other circulatory complications: Secondary | ICD-10-CM

## 2021-12-02 DIAGNOSIS — J432 Centrilobular emphysema: Secondary | ICD-10-CM

## 2021-12-02 DIAGNOSIS — E662 Morbid (severe) obesity with alveolar hypoventilation: Secondary | ICD-10-CM | POA: Diagnosis not present

## 2021-12-02 DIAGNOSIS — I5032 Chronic diastolic (congestive) heart failure: Secondary | ICD-10-CM | POA: Diagnosis not present

## 2021-12-02 DIAGNOSIS — I10 Essential (primary) hypertension: Secondary | ICD-10-CM | POA: Diagnosis not present

## 2021-12-02 DIAGNOSIS — I491 Atrial premature depolarization: Secondary | ICD-10-CM

## 2021-12-02 MED ORDER — FUROSEMIDE 20 MG PO TABS: 20.0000 mg | ORAL_TABLET | Freq: Every day | ORAL | 3 refills | Status: DC

## 2021-12-02 NOTE — Patient Instructions (Addendum)
Medication Instructions:  ?Lasix 20 mg daily ? ?If you need a refill on your cardiac medications before your next appointment, please call your pharmacy.  ? ?Lab work: ?No new labs needed ? ?Testing/Procedures: ?No new testing needed ? ?Follow-Up: ?At [facility], you and your health needs are our priority.  As part of our continuing mission to provide you with exceptional heart care, we have created designated Provider Care Teams.  These Care Teams include your primary Cardiologist (physician) and Advanced Practice Providers (APPs -  Physician Assistants and Nurse Practitioners) who all work together to provide you with the care you need, when you need it. ? ?You will need a follow up appointment in 12 months ? ?Providers on your designated Care Team:   ?Murray Hodgkins, NP ?Christell Faith, PA-C ?Cadence Kathlen Mody, PA-C ? ?COVID-19 Vaccine Information can be found at: ShippingScam.co.uk For questions related to vaccine distribution or appointments, please email [email or web address] or call [phone].  ? ?

## 2021-12-03 ENCOUNTER — Ambulatory Visit: Payer: Self-pay | Admitting: Pharmacist

## 2021-12-03 NOTE — Chronic Care Management (AMB) (Signed)
?  Chronic Care Management  ? ?Note ? ?12/03/2021 ?Name: ROSALEAH PERSON MRN: [id number] DOB: 1966-02-23 ? ? ? ?Closing pharmacy CCM case at this time.  Patient has clinic contact information for future questions or concerns.  ? ?[name], PharmD, [place], CPP ?Clinical Pharmacist ?Therapist, music at [employer] ?[phone] ? ?

## 2021-12-03 NOTE — Patient Instructions (Signed)
Hi Martha Woodard,  ? ?Unfortunately, I am being asked to quickly transition into another role within the health system, so I am unable to keep our next appointment. Please continue to follow up with Dr. Caryl Bis as scheduled.  ? ?As a reminder - The AZ&Me prescription savings program has an auto-refill program where you do not need to call and request a refill each time. You should receive your medication shipment before you run out of your medication. However, if you have any issues or need assistance, you can call them call [phone]. They are available Monday though Friday 9:00 AM - 6:00 PM.  ? ? ?It has been a pleasure working with you! ? ?[name], PharmD ? ?

## 2021-12-05 ENCOUNTER — Other Ambulatory Visit: Payer: Self-pay | Admitting: Family Medicine

## 2021-12-05 DIAGNOSIS — R232 Flushing: Secondary | ICD-10-CM

## 2021-12-09 ENCOUNTER — Other Ambulatory Visit: Payer: Self-pay | Admitting: Family Medicine

## 2021-12-18 ENCOUNTER — Other Ambulatory Visit: Payer: Self-pay | Admitting: Cardiovascular Disease

## 2021-12-22 LAB — HM DIABETES FOOT EXAM: HM Diabetic Foot Exam: NORMAL

## 2021-12-29 ENCOUNTER — Other Ambulatory Visit: Payer: Self-pay | Admitting: Cardiovascular Disease

## 2021-12-29 DIAGNOSIS — I491 Atrial premature depolarization: Secondary | ICD-10-CM

## 2022-01-02 ENCOUNTER — Other Ambulatory Visit: Payer: Self-pay | Admitting: *Deleted

## 2022-01-02 DIAGNOSIS — Z87891 Personal history of nicotine dependence: Secondary | ICD-10-CM

## 2022-01-02 DIAGNOSIS — Z122 Encounter for screening for malignant neoplasm of respiratory organs: Secondary | ICD-10-CM

## 2022-01-06 ENCOUNTER — Encounter: Payer: Self-pay | Admitting: Family Medicine

## 2022-01-06 ENCOUNTER — Ambulatory Visit (INDEPENDENT_AMBULATORY_CARE_PROVIDER_SITE_OTHER): Payer: Medicare Other | Admitting: Family Medicine

## 2022-01-06 DIAGNOSIS — E1159 Type 2 diabetes mellitus with other circulatory complications: Secondary | ICD-10-CM

## 2022-01-06 DIAGNOSIS — I5032 Chronic diastolic (congestive) heart failure: Secondary | ICD-10-CM | POA: Diagnosis not present

## 2022-01-06 DIAGNOSIS — I10 Essential (primary) hypertension: Secondary | ICD-10-CM

## 2022-01-06 DIAGNOSIS — J329 Chronic sinusitis, unspecified: Secondary | ICD-10-CM | POA: Insufficient documentation

## 2022-01-06 DIAGNOSIS — J0140 Acute pansinusitis, unspecified: Secondary | ICD-10-CM | POA: Diagnosis not present

## 2022-01-06 DIAGNOSIS — I872 Venous insufficiency (chronic) (peripheral): Secondary | ICD-10-CM | POA: Diagnosis not present

## 2022-01-06 MED ORDER — DOXYCYCLINE HYCLATE 100 MG PO TABS: 100.0000 mg | ORAL_TABLET | Freq: Two times a day (BID) | ORAL | 0 refills | Status: DC

## 2022-01-06 NOTE — Assessment & Plan Note (Signed)
Well-controlled.  She will continue metoprolol 25 mg once daily, losartan 25 mg twice daily, HCTZ 12.5 mg daily, and Lasix 20 mg daily. ?

## 2022-01-06 NOTE — Progress Notes (Signed)
?Martha Rumps, MD ?Phone: [phone] ? ?Martha Woodard is a 56 y.o. female who presents today for f/u. ? ?HYPERTENSION/CHF ?Disease Monitoring ?Chest pain- no    Dyspnea- no Orthopnea- no        PND- no ?Medications ?Compliance-  taking losartan, HCTZ, lasix, metoprolol.  Edema- no ?BMET ?   ?Component Value Date/Time  ? NA 142 11/26/2021 0955  ? K 4.1 11/26/2021 0955  ? CL 101 11/26/2021 0955  ? CO2 33 (H) 11/26/2021 0955  ? GLUCOSE 112 (H) 11/26/2021 0955  ? BUN 13 11/26/2021 0955  ? BUN 8 02/10/2014 1112  ? CREATININE 0.67 11/26/2021 0955  ? CALCIUM 9.7 11/26/2021 0955  ? GFRNONAA >60 12/03/2018 1503  ? GFRAA >60 12/03/2018 1503  ? ?DIABETES ?Disease Monitoring: ?Blood Sugar ranges-<130 Polyuria/phagia/dipsia- no      Optho- UTD ?Medications: ?Compliance- taking metformin Hypoglycemic symptoms- rare, eats and improves ? ?Congestion: Patient reports sinus pain and congestion over the past week.  Notes she is not getting much out of her nose.  No fevers.  Does have postnasal drip.  Some cough that is productive of yellow phlegm.  She has intermittent issues with sinus infections and notes her taste and smell always diminished with those.  She has been using her Flonase.  She notes no COVID exposure and she does not leave the house other than to go to the grocery store. ? 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  Occasionally there is some discomfort if she touches the area and she oftentimes will have some itching. ? ? ?Social History  ? ?Tobacco Use  ?Smoking Status Former  ? Packs/day: 3.00  ? Years: 33.00  ? Pack years: 99.00  ? Types: Cigarettes  ? Quit date: 07/10/2013  ? Years since quitting: 8.4  ?Smokeless Tobacco Never  ? ? ?Current Outpatient Medications on File Prior to Visit  ?Medication Sig Dispense Refill  ? ACCU-CHEK AVIVA PLUS test strip TEST UP TO 4 TIMES DAILY AS DIRECTED 400 strip 3  ? Accu-Chek Softclix Lancets lancets TEST UP TO 4 TIMES  DAILY 400 each 3  ? acetaminophen (TYLENOL) 650 MG CR tablet Take 650 mg by mouth every 8 (eight) hours as needed for pain.    ? albuterol (PROVENTIL) (2.5 MG/3ML) 0.083% nebulizer solution Take 3 mLs (2.5 mg total) by nebulization every 6 (six) hours as needed for wheezing or shortness of breath. 75 mL 12  ? aspirin EC 81 MG tablet Take 1 tablet (81 mg total) by mouth daily. 90 tablet 3  ? blood glucose meter kit and supplies Dispense based on patient and insurance preference. Use up to four times daily as directed. (FOR ICD-9 250.00, 250.01). 1 each 0  ? budesonide (PULMICORT FLEXHALER) 180 MCG/ACT inhaler Inhale 1-2 puffs into the lungs 2 (two) times daily. 3 each 3  ? calcium-vitamin D (OSCAL WITH D) 500-200 MG-UNIT tablet Take 1 tablet by mouth 2 (two) times daily.    ? cetirizine (ZYRTEC) 10 MG tablet Take 10 mg by mouth daily.    ? fluticasone (FLONASE) 50 MCG/ACT nasal spray USE 1 OR 2 SPRAYS IN EACH NOSTRIL ONCE DAILY. 16 g 12  ? furosemide (LASIX) 20 MG tablet Take 1 tablet (20 mg total) by mouth daily. 90 tablet 3  ? gabapentin (NEURONTIN) 100 MG capsule TAKE 1 CAPSULE BY MOUTH 3 TIMES  DAILY 270 capsule 3  ? Glycopyrrolate-Formoterol (BEVESPI AEROSPHERE) 9-4.8 MCG/ACT AERO Inhale 2 puffs into the  lungs 2 (two) times daily. 10.7 g 11  ? hydrochlorothiazide (MICROZIDE) 12.5 MG capsule TAKE 1 CAPSULE BY MOUTH DAILY 90 capsule 3  ? ketoconazole (NIZORAL) 2 % cream Apply 1 application topically daily. 15 g 0  ? ketoconazole (NIZORAL) 2 % shampoo Apply 1 application topically 2 (two) times a week. 120 mL 0  ? losartan (COZAAR) 25 MG tablet TAKE 1 TABLET BY MOUTH TWICE  DAILY 180 tablet 3  ? magnesium oxide (MAG-OX) 400 MG tablet Take 400 mg by mouth daily.    ? metFORMIN (GLUCOPHAGE) 500 MG tablet TAKE 2 TABLETS BY MOUTH  TWICE DAILY WITH MEALS 360 tablet 3  ? metoprolol succinate (TOPROL-XL) 25 MG 24 hr tablet TAKE 1 TABLET BY MOUTH ONCE  DAILY 90 tablet 3  ? montelukast (SINGULAIR) 10 MG tablet TAKE 1 TABLET  BY MOUTH AT  BEDTIME 30 tablet 11  ? Multiple Vitamin (MULTIVITAMIN) tablet Take 1 tablet by mouth daily.    ? nystatin (MYCOSTATIN) 100000 UNIT/ML suspension Take 5 mLs (500,000 Units total) by mouth 4 (four) times daily. X 7-10 days 473 mL 0  ? nystatin (MYCOSTATIN/NYSTOP) powder Apply 1 application topically 2 (two) times daily. 60 g 11  ? nystatin cream (MYCOSTATIN) Apply 1 application topically 2 (two) times daily. 30 g 0  ? ondansetron (ZOFRAN ODT) 4 MG disintegrating tablet Take 1 tablet (4 mg total) by mouth every 8 (eight) hours as needed for nausea or vomiting. 20 tablet 0  ? OXYGEN Inhale 2 L into the lungs daily. 2 liters at night, 1 liter when doing activities, and no oxygen when at home during the day.    ? potassium chloride (KLOR-CON) 10 MEQ tablet TAKE 1 TABLET BY MOUTH  TWICE DAILY 180 tablet 3  ? PROVENTIL HFA 108 (90 Base) MCG/ACT inhaler Inhale 2 puffs into the lungs every 6 (six) hours as needed for wheezing or shortness of breath. 18 gram inhaler 3 each 1  ? rosuvastatin (CRESTOR) 20 MG tablet TAKE 1 TABLET BY MOUTH DAILY 90 tablet 3  ? sodium chloride (OCEAN) 0.65 % SOLN nasal spray Place 2 sprays into both nostrils as needed for congestion. 30 mL 11  ? vitamin C (ASCORBIC ACID) 500 MG tablet Take 1,000 mg by mouth daily.     ? Vitamin E 180 MG CAPS Take 1 capsule by mouth daily.    ? ?No current facility-administered medications on file prior to visit.  ? ? ? ?ROS see history of present illness ? ?Objective ? ?Physical Exam ?Vitals:  ? 01/06/22 1034  ?BP: 130/70  ?Pulse: 70  ?Temp: 98.3 ?F (36.8 ?C)  ?SpO2: 96%  ? ? ?BP Readings from Last 3 Encounters:  ?01/06/22 130/70  ?12/02/21 130/70  ?11/14/21 122/82  ? ?Wt Readings from Last 3 Encounters:  ?01/06/22 258 lb 12.8 oz (117.4 kg)  ?12/02/21 251 lb 8 oz (114.1 kg)  ?11/14/21 254 lb (115.2 kg)  ? ? ?Physical Exam ?Constitutional:   ?   General: She is not in acute distress. ?   Appearance: She is not diaphoretic.  ?Cardiovascular:  ?   Rate  and Rhythm: Normal rate and regular rhythm.  ?   Heart sounds: Normal heart sounds.  ?Pulmonary:  ?   Effort: Pulmonary effort is normal.  ?   Breath sounds: Normal breath sounds.  ?Musculoskeletal:  ?   Right lower leg: No edema.  ?   Left lower leg: No edema.  ?Skin: ?   General: Skin is warm  and dry.  ?   Comments: Bilateral lower extremities with brown splotches over her lower legs, and around her ankles  ?Neurological:  ?   Mental Status: She is alert.  ? ?Diabetic Foot Exam - Simple   ?Simple Foot Form ?Diabetic Foot exam was performed with the following findings: Yes 01/06/2022 10:55 AM  ?Visual Inspection ?No deformities, no ulcerations, no other skin breakdown bilaterally: Yes ?Sensation Testing ?Intact to touch and monofilament testing bilaterally: Yes ?Pulse Check ?Posterior Tibialis and Dorsalis pulse intact bilaterally: Yes ?Comments ?  ? ? ? ?Assessment/Plan: Please see individual problem list. ? ?Problem List Items Addressed This Visit   ? ? Chronic diastolic heart failure ([place])  ?  Patient seems to be euvolemic.  I did discuss starting her on Farxiga for her cardiac benefit as well as her diabetes.  Advised to monitor for any yeast infections or UTI symptoms and if those occur she will need to let us know.  We will work on getting this approved through [employer]. ?  ?  ? DM type 2 (diabetes mellitus, type 2) ([place])  ?  Well-controlled on recent A1c.  She will continue metformin 1000 mg twice daily.  We will try to get her on Farxiga through the [place]. ?  ?  ? Essential hypertension  ?  Well-controlled.  She will continue metoprolol 25 mg once daily, losartan 25 mg twice daily, HCTZ 12.5 mg daily, and Lasix 20 mg daily. ?  ?  ? Sinusitis  ?  The patient's respiratory symptoms are consistent with sinusitis.  We will treat with doxycycline.  She was advised to monitor for diarrhea.  I discussed the risk of skin sensitivity to the sun with this medication and advised to  good sun protection if she goes outside.  Advised if she is not improving over the next 3 to 5 days on the antibiotic she should let us know. ?  ?  ? Relevant Medications  ? doxycycline (VIBRA-TABS) 10

## 2022-01-06 NOTE — Assessment & Plan Note (Signed)
Well-controlled on recent A1c.  She will continue metformin 1000 mg twice daily.  We will try to get her on Farxiga through the [place]. ?

## 2022-01-06 NOTE — Assessment & Plan Note (Signed)
Patient seems to be euvolemic.  I did discuss starting her on Farxiga for her cardiac benefit as well as her diabetes.  Advised to monitor for any yeast infections or UTI symptoms and if those occur she will need to let us know.  We will work on getting this approved through [employer]. ?

## 2022-01-06 NOTE — Assessment & Plan Note (Addendum)
The patient's respiratory symptoms are consistent with sinusitis.  We will treat with doxycycline.  She was advised to monitor for diarrhea.  I discussed the risk of skin sensitivity to the sun with this medication and advised to good sun protection if she goes outside.  Advised if she is not improving over the next 3 to 5 days on the antibiotic she should let us know. ?

## 2022-01-06 NOTE — Assessment & Plan Note (Signed)
Discussed that the skin changes in her legs are likely related to venous insufficiency.  Advised she could try topical over-the-counter hydrocortisone to see if that would help with the itching and discomfort.  If its not beneficial she will let us know and we can do a prescription topical steroid. ?

## 2022-01-06 NOTE — Patient Instructions (Signed)
Nice to see you. ?We will treat you with doxycycline for your sinus infection.  If you are not improving in the next 3 to 5 days please let us know. ?We will work on getting [facility] approved for you. ?

## 2022-01-07 ENCOUNTER — Telehealth: Payer: Self-pay | Admitting: *Deleted

## 2022-01-07 DIAGNOSIS — E1159 Type 2 diabetes mellitus with other circulatory complications: Secondary | ICD-10-CM

## 2022-01-07 MED ORDER — DAPAGLIFLOZIN PROPANEDIOL 10 MG PO TABS: 10.0000 mg | ORAL_TABLET | Freq: Every day | ORAL | 3 refills | Status: DC

## 2022-01-07 NOTE — Telephone Encounter (Signed)
The AZ&Me prescription savings program has an auto-refill program where you do not need to call and request a refill each time. You should receive your medication shipment before you run out of your medication. However, if you have any issues or need assistance, you can call them call [phone]. They are available Monday though Friday 9:00 AM - 6:00 PM.   ?

## 2022-01-07 NOTE — Progress Notes (Signed)
Farxiga 10 mg once daily can be sent in for her. 90 tablets. 3 refills.  ?

## 2022-01-07 NOTE — Progress Notes (Signed)
Completed see phone note 01/07/2022 ?

## 2022-01-07 NOTE — Telephone Encounter (Signed)
I have given each CMA a copy of the information Catie left me , I am willing to help with this process as well called [place], was advised patient could call reverify for new medication by phone which is in process that application not needed since she is already in the system. Once patient has completed process she is to call me and advise and then we can E-scribe [place] which dose? ?

## 2022-01-07 NOTE — Progress Notes (Signed)
I have given each CMA a copy of the information Catie left me , I am willing to help with this process as well called [place], was advised patient could call reverify for new medication by phone which is in process that application not needed since she is already in the system. Once patient has completed process she is to call me and advise and then we can E-scribe [place] which dose? ?

## 2022-01-07 NOTE — Telephone Encounter (Signed)
-----   Message from Leone Haven, MD sent at 01/06/2022 11:02 AM EDT ----- ?This patient has patient assistance through [employer]. I wanted to start her on farxiga which she should be able to get from them. Did Catie go over this process with you or any of the CMAs in the office before she left? ?

## 2022-01-07 NOTE — Telephone Encounter (Signed)
Script printed for PCP signature fax and case  given to [place] with script on cover sheet. Number Case No. [id number] fax [phone]. ?

## 2022-01-08 NOTE — Telephone Encounter (Signed)
Faxed and confirmation given.  Davionna Blacksher,cma  

## 2022-01-08 NOTE — Telephone Encounter (Signed)
Completed.  Please fax.

## 2022-01-17 ENCOUNTER — Ambulatory Visit (INDEPENDENT_AMBULATORY_CARE_PROVIDER_SITE_OTHER): Payer: Medicare Other | Admitting: Acute Care

## 2022-01-17 ENCOUNTER — Encounter: Payer: Self-pay | Admitting: Acute Care

## 2022-01-17 DIAGNOSIS — Z87891 Personal history of nicotine dependence: Secondary | ICD-10-CM | POA: Diagnosis not present

## 2022-01-17 NOTE — Progress Notes (Signed)
Virtual Visit via Telephone Note ? ?I connected with Martha Woodard on 08/13/21 at  2:00 PM EST by telephone and verified that I am speaking with the correct person using two identifiers. ? ?Location: ?Patient: Home ?Provider: Working from home ?  ?I discussed the limitations, risks, security and privacy concerns of performing an evaluation and management service by telephone and the availability of in person appointments. I also discussed with the patient that there may be a patient responsible charge related to this service. The patient expressed understanding and agreed to proceed. ? ?Shared Decision Making Visit Lung Cancer Screening Program ?[phone]) ? ? ?Eligibility: ?Age 56 y.o. ?Pack Years Smoking History Calculation 102 ?(# packs/per year x # years smoked) ?Recent History of coughing up blood  no ?Unexplained weight loss? no ?( >Than 15 pounds within the last 6 months ) ?Prior History Lung / other cancer no ?(Diagnosis within the last 5 years already requiring surveillance chest CT Scans). ?Smoking Status Former Smoker ?Former Smokers: Years since quit: 8 years ? Quit Date: 2014 ? ?Visit Components: ?Discussion included one or more decision making aids. yes ?Discussion included risk/benefits of screening. yes ?Discussion included potential follow up diagnostic testing for abnormal scans. yes ?Discussion included meaning and risk of over diagnosis. yes ?Discussion included meaning and risk of False Positives. yes ?Discussion included meaning of total radiation exposure. yes ? ?Counseling Included: ?Importance of adherence to annual lung cancer LDCT screening. yes ?Impact of comorbidities on ability to participate in the program. yes ?Ability and willingness to under diagnostic treatment. yes ? ?Smoking Cessation Counseling: ?Current Smokers:  ?Discussed importance of smoking cessation. yes ?Information about tobacco cessation classes and interventions provided to patient. yes ?Patient provided with "ticket"  for LDCT Scan. yes ?Symptomatic Patient. no ? Counseling NA ?Diagnosis Code: Tobacco Use Z72.0 ?Asymptomatic Patient no ? Counseling NA ?Former Smokers:  ?Discussed the importance of maintaining cigarette abstinence. yes ?Diagnosis Code: Personal History of Nicotine Dependence. U04.540 ?Information about tobacco cessation classes and interventions provided to patient. Yes ?Patient provided with "ticket" for LDCT Scan. yes ?Written Order for Lung Cancer Screening with LDCT placed in Epic. Yes ?(CT Chest Lung Cancer Screening Low Dose W/O CM) [id number] ?Z12.2-Screening of respiratory organs ?Z87.891-Personal history of nicotine dependence ? ? ?I spent 25 minutes of face to face time with her discussing the risks and benefits of lung cancer screening. We viewed a power point together that explained in detail the above noted topics. We took the time to pause the power point at intervals to allow for questions to be asked and answered to ensure understanding. We discussed that she had taken the single most powerful action possible to decrease her risk of developing lung cancer when she quit smoking. I counseled her to remain smoke free, and to contact me if she ever had the desire to smoke again so that I can provide resources and tools to help support the effort to remain smoke free. We discussed the time and location of the scan, and that either  Doroteo Glassman RN or I will call with the results within  24-48 hours of receiving them. She has my card and contact information in the event she needs to speak with me, in addition to a copy of the power point we reviewed as a resource. She verbalized understanding of all of the above and had no further questions upon leaving the office.  ? ? ? ?I explained to the patient that there has been a high incidence of  coronary artery disease noted on these exams. I explained that this is a non-gated exam therefore degree or severity cannot be determined. This patient is on statin  therapy. I have asked the patient to follow-up with their PCP regarding any incidental finding of coronary artery disease and management with diet or medication as they feel is clinically indicated. The patient verbalized understanding of the above and had no further questions. ? ? ?Zayven Powe D. Harris, NP-C ?[facility] ?Personal contact information can be found on Amion  ?01/17/2022, 8:45 AM ? ? ? ? ? ? ? ? ? ?

## 2022-01-17 NOTE — Patient Instructions (Signed)
Thank you for participating in the [facility] Lung Cancer Screening Program. It was our pleasure to meet you today. We will call you with the results of your scan within the next few days. Your scan will be assigned a Lung RADS category score by the physicians reading the scans.  This Lung RADS score determines follow up scanning.  See below for description of categories, and follow up screening recommendations. We will be in touch to schedule your follow up screening annually or based on recommendations of our providers. We will fax a copy of your scan results to your Primary Care Physician, or the physician who referred you to the program, to ensure they have the results. Please call the office if you have any questions or concerns regarding your scanning experience or results.  Our office number is [phone]. Please speak with Denise Phelps, RN. , or  Denise Buckner RN, They are  our Lung Cancer Screening RN.'s If They are unavailable when you call, Please leave a message on the voice mail. We will return your call at our earliest convenience.This voice mail is monitored several times a day.  Remember, if your scan is normal, we will scan you annually as long as you continue to meet the criteria for the program. (Age 55-77, Current smoker or smoker who has quit within the last 15 years). If you are a smoker, remember, quitting is the single most powerful action that you can take to decrease your risk of lung cancer and other pulmonary, breathing related problems. We know quitting is hard, and we are here to help.  Please let us know if there is anything we can do to help you meet your goal of quitting. If you are a former smoker, congratulations. We are proud of you! Remain smoke free! Remember you can refer friends or family members through the number above.  We will screen them to make sure they meet criteria for the program. Thank you for helping us take better care of you by  participating in Lung Screening.  You can receive free nicotine replacement therapy ( patches, gum or mints) by calling 1-800-QUIT NOW. Please call so we can get you on the path to becoming  a non-smoker. I know it is hard, but you can do this!  Lung RADS Categories:  Lung RADS 1: no nodules or definitely non-concerning nodules.  Recommendation is for a repeat annual scan in 12 months.  Lung RADS 2:  nodules that are non-concerning in appearance and behavior with a very low likelihood of becoming an active cancer. Recommendation is for a repeat annual scan in 12 months.  Lung RADS 3: nodules that are probably non-concerning , includes nodules with a low likelihood of becoming an active cancer.  Recommendation is for a 6-month repeat screening scan. Often noted after an upper respiratory illness. We will be in touch to make sure you have no questions, and to schedule your 6-month scan.  Lung RADS 4 A: nodules with concerning findings, recommendation is most often for a follow up scan in 3 months or additional testing based on our provider's assessment of the scan. We will be in touch to make sure you have no questions and to schedule the recommended 3 month follow up scan.  Lung RADS 4 B:  indicates findings that are concerning. We will be in touch with you to schedule additional diagnostic testing based on our provider's  assessment of the scan.  Other options for assistance in smoking cessation (   As covered by your insurance benefits)  Hypnosis for smoking cessation  [employer] [phone]  Acupuncture for smoking cessation  [facility] [phone]   

## 2022-01-21 ENCOUNTER — Ambulatory Visit
Admission: RE | Admit: 2022-01-21 | Discharge: 2022-01-21 | Disposition: A | Discharge status: Home / Self Care | Payer: Medicare Other | Source: Ambulatory Visit | Attending: Acute Care | Admitting: Acute Care

## 2022-01-21 DIAGNOSIS — Z122 Encounter for screening for malignant neoplasm of respiratory organs: Secondary | ICD-10-CM | POA: Diagnosis not present

## 2022-01-21 DIAGNOSIS — Z87891 Personal history of nicotine dependence: Secondary | ICD-10-CM | POA: Diagnosis not present

## 2022-01-22 ENCOUNTER — Telehealth: Payer: Self-pay | Admitting: Acute Care

## 2022-01-22 NOTE — Telephone Encounter (Signed)
I have attempted to call the patient with the results of her low-dose CT scan.  There was no answer.  I have left a HIPAA compliant message on the patient's voicemail asking her to call the office at her earliest convenience so we can review the results with her.  I left office contact information on her voicemail.  We will await her return call and if we have not heard from her by tomorrow we will try again. ?

## 2022-01-22 NOTE — Telephone Encounter (Signed)
I have called the patient with the results of her low-dose screening CT.  I explained that her scan was read as a lung RADS 3, probably benign findings.  There is a new 7.3 mm nodule that was not readily apparent on her prior screening CT that we will reevaluate in 6 months.  We also reviewed the finding of coronary artery disease, and hepatic steatosis.  Patient states she is followed by cardiology and she is on statin medication.  She is aware of the hepatic steatosis noted on her scan.  Denise please fax CT scan results to PCP, and Dr. Ida Rogue let them know plan is for 13-monthfollow-up CT. ? ?Please place order for 655-monthollow-up low-dose CT due after 07/23/2022.   ?Thanks so much ? ? ? ? ?Low Dose CT Chest 01/21/2022 ?Cardiovascular: Aortic atherosclerosis. Borderline cardiomegaly. ?Left main and 3 vessel coronary artery calcification. ?  ?Mediastinum/Nodes: No mediastinal or definite hilar adenopathy, ?given limitations of unenhanced CT. ?  ?Lungs/Pleura: No pleural fluid. Mild to moderate centrilobular ?emphysema. Multiple bilateral pulmonary nodules. Some of the larger ?nodules (example 13.6 mm in the right middle lobe) were present on ?the 01/22/2016 exam. However, other nodules were not readily ?apparent on the prior, including at up to volume derived equivalent ?diameter 7.3 mm. ?  ?Upper Abdomen: Hepatic steatosis with sparing in the posterior ?aspect of segment 4. Normal imaged portions of the spleen, stomach, ?pancreas, adrenal glands, kidneys. ?  ?Musculoskeletal: Presumed sebaceous cyst about the right paramidline ?posterior chest wall at 11 mm on 15/2. Present at 4 mm on the prior. ?Midthoracic spondylosis. ?  ?IMPRESSION: ?1. Lung-RADS 3, probably benign findings. Short-term follow-up in 6 ?months is recommended with repeat low-dose chest CT without contrast ?(please use the following order, "CT CHEST LCS NODULE FOLLOW-UP W/O ?CM"). Multiple pulmonary nodules as detailed above. ?2. Aortic  Atherosclerosis (ICD10-I70.0) and Emphysema (ICD10-J43.9). ?3. Age advanced coronary artery atherosclerosis. Recommend ?assessment of coronary risk factors. ?4. Hepatic steatosis ?

## 2022-01-23 ENCOUNTER — Other Ambulatory Visit: Payer: Self-pay | Admitting: Acute Care

## 2022-01-23 DIAGNOSIS — Z87891 Personal history of nicotine dependence: Secondary | ICD-10-CM

## 2022-01-23 DIAGNOSIS — Z122 Encounter for screening for malignant neoplasm of respiratory organs: Secondary | ICD-10-CM

## 2022-01-23 NOTE — Telephone Encounter (Signed)
CT results faxed to PCP with f/u plans included. Order placed for 6 mth nodule low dose CT.  ?

## 2022-01-23 NOTE — Telephone Encounter (Signed)
See other telephone note from 01/22/2022 ?

## 2022-02-04 DIAGNOSIS — H43813 Vitreous degeneration, bilateral: Secondary | ICD-10-CM | POA: Diagnosis not present

## 2022-02-04 DIAGNOSIS — H1045 Other chronic allergic conjunctivitis: Secondary | ICD-10-CM | POA: Diagnosis not present

## 2022-02-04 DIAGNOSIS — E119 Type 2 diabetes mellitus without complications: Secondary | ICD-10-CM | POA: Diagnosis not present

## 2022-02-04 LAB — HM DIABETES EYE EXAM

## 2022-02-06 ENCOUNTER — Ambulatory Visit (INDEPENDENT_AMBULATORY_CARE_PROVIDER_SITE_OTHER): Payer: Medicare Other | Admitting: Obstetrics and Gynecology

## 2022-02-06 ENCOUNTER — Encounter: Payer: Self-pay | Admitting: Obstetrics and Gynecology

## 2022-02-06 VITALS — BP 139/58 | HR 72 | Ht 66.0 in

## 2022-02-06 DIAGNOSIS — B372 Candidiasis of skin and nail: Secondary | ICD-10-CM

## 2022-02-06 DIAGNOSIS — Z01419 Encounter for gynecological examination (general) (routine) without abnormal findings: Secondary | ICD-10-CM | POA: Diagnosis not present

## 2022-02-06 DIAGNOSIS — E1159 Type 2 diabetes mellitus with other circulatory complications: Secondary | ICD-10-CM | POA: Diagnosis not present

## 2022-02-06 DIAGNOSIS — Z1231 Encounter for screening mammogram for malignant neoplasm of breast: Secondary | ICD-10-CM

## 2022-02-06 NOTE — Patient Instructions (Signed)
Breast Self-Awareness ?Breast self-awareness is knowing how your breasts look and feel. You need to: ?Check your breasts on a regular basis. ?Tell your doctor about any changes. ?Become familiar with the look and feel of your breasts. This can help you catch a breast problem while it is still small and can be treated. You should do breast self-exams even if you have breast implants. ?What you need: ?A mirror. ?A well-lit room. ?A pillow or other soft object. ?How to do a breast self-exam ?Follow these steps to do a breast self-exam: ?Look for changes ? ?Take off all the clothes above your waist. ?Stand in front of a mirror in a room with good lighting. ?Put your hands down at your sides. ?Compare your breasts in the mirror. Look for any difference between them, such as: ?A difference in shape. ?A difference in size. ?Wrinkles, dips, and bumps in one breast and not the other. ?Look at each breast for changes in the skin, such as: ?Redness. ?Scaly areas. ?Skin that has gotten thicker. ?Dimpling. ?Open sores (ulcers). ?Look for changes in your nipples, such as: ?Fluid coming out of a nipple. ?Fluid around a nipple. ?Bleeding. ?Dimpling. ?Redness. ?A nipple that looks pushed in (retracted), or that has changed position. ?Feel for changes ?Lie on your back. ?Feel each breast. To do this: ?Pick a breast to feel. ?Place a pillow under the shoulder closest to that breast. Put the arm closest to that breast behind your head. ?Feel the nipple area of that breast using the hand of your other arm. Feel the area with the pads of your three middle fingers by making small circles with your fingers. Use light, medium, and firm pressure. ?Continue the overlapping circles, moving downward over the breast. Keep making circles with your fingers. Stop when you feel your ribs. ?Start making circles with your fingers again, this time going upward until you reach your collarbone. ?Then, make circles outward across your breast and into your  armpit area. ?Squeeze your nipple. Check for discharge and lumps. ?Repeat these steps to check your other breast. ?Sit or stand in the tub or shower. ?With soapy water on your skin, feel each breast the same way you did when you were lying down. ?Write down what you find ?Writing down what you find can help you remember what to tell your doctor. Write down: ?What is normal for each breast. ?Any changes you find in each breast. These include: ?The kind of changes you find. ?A tender or painful breast. ?Any lump you find. Write down its size and where it is. ?When you last had your monthly period (menstrual cycle). ?General tips ?If you are breastfeeding, the best time to check your breasts is after you feed your baby or after you use a breast pump. ?If you get monthly bleeding, the best time to check your breasts is 5-7 days after your monthly cycle ends. ?With time, you will become comfortable with the self-exam. You will also start to know if there are changes in your breasts. ?Contact a doctor if: ?You see a change in the shape or size of your breasts or nipples. ?You see a change in the skin of your breast or nipples, such as red or scaly skin. ?You have fluid coming from your nipples that is not normal. ?You find a new lump or thick area. ?You have breast pain. ?You have any concerns about your breast health. ?Summary ?Breast self-awareness includes looking for changes in your breasts and feeling for changes   within your breasts. ?You should do breast self-awareness in front of a mirror in a well-lit room. ?If you get monthly periods (menstrual cycles), the best time to check your breasts is 5-7 days after your period ends. ?Tell your doctor about any changes you see in your breasts. Changes include changes in size, changes on the skin, painful or tender breasts, or fluid from your nipples that is not normal. ?This information is not intended to replace advice given to you by your health care provider. Make sure  you discuss any questions you have with your health care provider. ?Document Revised: 07/18/2021 Document Reviewed: 07/18/2021 ?Elsevier Patient Education ? Martha Woodard. ?Preventive Care 29-58 Years Old, Female ?Preventive care refers to lifestyle choices and visits with your health care provider that can promote health and wellness. Preventive care visits are also called wellness exams. ?What can I expect for my preventive care visit? ?Counseling ?Your health care provider may ask you questions about your: ?Medical history, including: ?Past medical problems. ?Family medical history. ?Pregnancy history. ?Current health, including: ?Menstrual cycle. ?Method of birth control. ?Emotional well-being. ?Home life and relationship well-being. ?Sexual activity and sexual health. ?Lifestyle, including: ?Alcohol, nicotine or tobacco, and drug use. ?Access to firearms. ?Diet, exercise, and sleep habits. ?Work and work Statistician. ?Sunscreen use. ?Safety issues such as seatbelt and bike helmet use. ?Physical exam ?Your health care provider will check your: ?Height and weight. These may be used to calculate your BMI (body mass index). BMI is a measurement that tells if you are at a healthy weight. ?Waist circumference. This measures the distance around your waistline. This measurement also tells if you are at a healthy weight and may help predict your risk of certain diseases, such as type 2 diabetes and high blood pressure. ?Heart rate and blood pressure. ?Body temperature. ?Skin for abnormal spots. ?What immunizations do I need? ? ?Vaccines are usually given at various ages, according to a schedule. Your health care provider will recommend vaccines for you based on your age, medical history, and lifestyle or other factors, such as travel or where you work. ?What tests do I need? ?Screening ?Your health care provider may recommend screening tests for certain conditions. This may include: ?Lipid and cholesterol  levels. ?Diabetes screening. This is done by checking your blood sugar (glucose) after you have not eaten for a while (fasting). ?Pelvic exam and Pap test. ?Hepatitis B test. ?Hepatitis C test. ?HIV (human immunodeficiency virus) test. ?STI (sexually transmitted infection) testing, if you are at risk. ?Lung cancer screening. ?Colorectal cancer screening. ?Mammogram. Talk with your health care provider about when you should start having regular mammograms. This may depend on whether you have a family history of breast cancer. ?BRCA-related cancer screening. This may be done if you have a family history of breast, ovarian, tubal, or peritoneal cancers. ?Bone density scan. This is done to screen for osteoporosis. ?Talk with your health care provider about your test results, treatment options, and if necessary, the need for more tests. ?Follow these instructions at home: ?Eating and drinking ? ?Eat a diet that includes fresh fruits and vegetables, whole grains, lean protein, and low-fat dairy products. ?Take vitamin and mineral supplements as recommended by your health care provider. ?Do not drink alcohol if: ?Your health care provider tells you not to drink. ?You are pregnant, may be pregnant, or are planning to become pregnant. ?If you drink alcohol: ?Limit how much you have to 0-1 drink a day. ?Know how much alcohol is in your  drink. In the [place] one drink equals one 12 oz bottle of beer (355 mL), one 5 oz glass of wine (148 mL), or one 1? oz glass of hard liquor (44 mL). ?Lifestyle ?Brush your teeth every morning and night with fluoride toothpaste. Floss one time each day. ?Exercise for at least 30 minutes 5 or more days each week. ?Do not use any products that contain nicotine or tobacco. These products include cigarettes, chewing tobacco, and vaping devices, such as e-cigarettes. If you need help quitting, ask your health care provider. ?Do not use drugs. ?If you are sexually active, practice safe sex. Use a  condom or other form of protection to prevent STIs. ?If you do not wish to become pregnant, use a form of birth control. If you plan to become pregnant, see your health care provider for a prepregnancy visit. ?Take

## 2022-02-06 NOTE — Progress Notes (Signed)
? ?ANNUAL PREVENTATIVE CARE GYNECOLOGY  ENCOUNTER NOTE ? ?Subjective:  ?    ? Martha Woodard is a 56 y.o. G45P0010 female here for a routine annual gynecologic exam. The patient is not sexually active. The patient is not taking hormone replacement therapy. Patient denies post-menopausal vaginal bleeding. The patient wears seatbelts: yes. The patient participates in regular exercise: no. Has the patient ever been transfused or tattooed?: no. The patient reports that there is not domestic violence in her life. ? ?Current complaints: ?1.  NONE  ?  ? ?Gynecologic History ?Patient's last menstrual period was 04/10/2014. ?Contraception: post menopausal status ?Last Pap: 02/05/2021. Results were: normal. Denies history of abnormal pap smears.  ?Last mammogram: 04/24/2020. Results were: normal ?Last Colonoscopy: 01/01/2016.  Results were normal.   ?  ? ?Obstetric History ?OB History  ?Gravida Para Term Preterm AB Living  ?1 0     1    ?SAB IAB Ectopic Multiple Live Births  ?1          ?  ?# Outcome Date GA Lbr Len/2nd Weight Sex Delivery Anes PTL Lv  ?1 SAB           ?  ?Obstetric Comments  ?1st Menstrual Cycle:  11  ?1st Pregnancy: 18  ? ? ?Past Medical History:  ?Diagnosis Date  ? CHF (congestive heart failure) ([place])   ? Colon polyp 01-01-2016  ? COPD (chronic obstructive pulmonary disease) ([place])   ? Diabetes mellitus without complication ([place])   ? Diastolic heart failure ([place])   ? HLD (hyperlipidemia)   ? Hypertension   ? Morbid obesity with BMI of 45.0-49.9, adult ([place])   ? Multiple lung nodules on CT   ? Shortness of breath dyspnea   ? ? ?Family History  ?Problem Relation Age of Onset  ? Cancer Mother 70  ?     uterian and ovarian   ? Goiter Mother   ? Stroke Father   ? Breast cancer Sister 99  ? Stroke Brother   ? Breast cancer Sister 69  ? Heart murmur Sister   ? ? ?Past Surgical History:  ?Procedure Laterality Date  ? BIOPSY THYROID Right 02-06-14  ? NEGATIVE FOR MALIGNANT CELLS proteinaceous material and macrophages.  ?  BREAST BIOPSY Left   ? neg- core  ? COLONOSCOPY WITH PROPOFOL N/A 01/01/2016  ? Procedure: COLONOSCOPY WITH PROPOFOL;  Surgeon: Lucilla Lame, MD;  Location: [facility] ENDOSCOPY;  Service: Endoscopy;  Laterality: N/A;  ? thryoid fna Right April 2015  ? Proteinaceous material and macrophages.  ? WISDOM TOOTH EXTRACTION    ? ? ?Social History  ? ?Socioeconomic History  ? Marital status: Single  ?  Spouse name: Not on file  ? Number of children: Not on file  ? Years of education: Not on file  ? Highest education level: Not on file  ?Occupational History  ? Not on file  ?Tobacco Use  ? Smoking status: Former  ?  Packs/day: 3.00  ?  Years: 34.00  ?  Pack years: 102.00  ?  Types: Cigarettes  ?  Quit date: 07/10/2013  ?  Years since quitting: 8.5  ? Smokeless tobacco: Never  ?Vaping Use  ? Vaping Use: Former  ?Substance and Sexual Activity  ? Alcohol use: No  ?  Alcohol/week: 0.0 standard drinks  ? Drug use: No  ? Sexual activity: Not Currently  ?Other Topics Concern  ? Not on file  ?Social History Narrative  ? Not on file  ? ?Social  Determinants of Health  ? ?Financial Resource Strain: Medium Risk  ? Difficulty of Paying Living Expenses: Somewhat hard  ?Food Insecurity: No Food Insecurity  ? Worried About Charity fundraiser in the Last Year: Never true  ? Ran Out of Food in the Last Year: Never true  ?Transportation Needs: No Transportation Needs  ? Lack of Transportation (Medical): No  ? Lack of Transportation (Non-Medical): No  ?Physical Activity: Insufficiently Active  ? Days of Exercise per Week: 7 days  ? Minutes of Exercise per Session: 20 min  ?Stress: No Stress Concern Present  ? Feeling of Stress : Not at all  ?Social Connections: Socially Isolated  ? Frequency of Communication with Friends and Family: More than three times a week  ? Frequency of Social Gatherings with Friends and Family: Three times a week  ? Attends Religious Services: Never  ? Active Member of Clubs or Organizations: No  ? Attends Archivist  Meetings: Never  ? Marital Status: Divorced  ?Intimate Partner Violence: Not At Risk  ? Fear of Current or Ex-Partner: No  ? Emotionally Abused: No  ? Physically Abused: No  ? Sexually Abused: No  ? ? ?Current Outpatient Medications on File Prior to Visit  ?Medication Sig Dispense Refill  ? ACCU-CHEK AVIVA PLUS test strip TEST UP TO 4 TIMES DAILY AS DIRECTED 400 strip 3  ? Accu-Chek Softclix Lancets lancets TEST UP TO 4 TIMES DAILY 400 each 3  ? acetaminophen (TYLENOL) 650 MG CR tablet Take 650 mg by mouth every 8 (eight) hours as needed for pain.    ? albuterol (PROVENTIL) (2.5 MG/3ML) 0.083% nebulizer solution Take 3 mLs (2.5 mg total) by nebulization every 6 (six) hours as needed for wheezing or shortness of breath. 75 mL 12  ? aspirin EC 81 MG tablet Take 1 tablet (81 mg total) by mouth daily. 90 tablet 3  ? blood glucose meter kit and supplies Dispense based on patient and insurance preference. Use up to four times daily as directed. (FOR ICD-9 250.00, 250.01). 1 each 0  ? budesonide (PULMICORT FLEXHALER) 180 MCG/ACT inhaler Inhale 1-2 puffs into the lungs 2 (two) times daily. 3 each 3  ? calcium-vitamin D (OSCAL WITH D) 500-200 MG-UNIT tablet Take 1 tablet by mouth 2 (two) times daily.    ? cetirizine (ZYRTEC) 10 MG tablet Take 10 mg by mouth daily.    ? dapagliflozin propanediol (FARXIGA) 10 MG TABS tablet Take 1 tablet (10 mg total) by mouth daily before breakfast. 90 tablet 3  ? doxycycline (VIBRA-TABS) 100 MG tablet Take 1 tablet (100 mg total) by mouth 2 (two) times daily. 14 tablet 0  ? fluticasone (FLONASE) 50 MCG/ACT nasal spray USE 1 OR 2 SPRAYS IN EACH NOSTRIL ONCE DAILY. 16 g 12  ? furosemide (LASIX) 20 MG tablet Take 1 tablet (20 mg total) by mouth daily. 90 tablet 3  ? gabapentin (NEURONTIN) 100 MG capsule TAKE 1 CAPSULE BY MOUTH 3 TIMES  DAILY 270 capsule 3  ? Glycopyrrolate-Formoterol (BEVESPI AEROSPHERE) 9-4.8 MCG/ACT AERO Inhale 2 puffs into the lungs 2 (two) times daily. 10.7 g 11  ?  hydrochlorothiazide (MICROZIDE) 12.5 MG capsule TAKE 1 CAPSULE BY MOUTH DAILY 90 capsule 3  ? ketoconazole (NIZORAL) 2 % cream Apply 1 application topically daily. 15 g 0  ? ketoconazole (NIZORAL) 2 % shampoo Apply 1 application topically 2 (two) times a week. 120 mL 0  ? losartan (COZAAR) 25 MG tablet TAKE 1 TABLET BY MOUTH  TWICE  DAILY 180 tablet 3  ? magnesium oxide (MAG-OX) 400 MG tablet Take 400 mg by mouth daily.    ? metFORMIN (GLUCOPHAGE) 500 MG tablet TAKE 2 TABLETS BY MOUTH  TWICE DAILY WITH MEALS 360 tablet 3  ? metoprolol succinate (TOPROL-XL) 25 MG 24 hr tablet TAKE 1 TABLET BY MOUTH ONCE  DAILY 90 tablet 3  ? montelukast (SINGULAIR) 10 MG tablet TAKE 1 TABLET BY MOUTH AT  BEDTIME 30 tablet 11  ? Multiple Vitamin (MULTIVITAMIN) tablet Take 1 tablet by mouth daily.    ? nystatin (MYCOSTATIN) 100000 UNIT/ML suspension Take 5 mLs (500,000 Units total) by mouth 4 (four) times daily. X 7-10 days 473 mL 0  ? nystatin (MYCOSTATIN/NYSTOP) powder Apply 1 application topically 2 (two) times daily. 60 g 11  ? nystatin cream (MYCOSTATIN) Apply 1 application topically 2 (two) times daily. 30 g 0  ? ondansetron (ZOFRAN ODT) 4 MG disintegrating tablet Take 1 tablet (4 mg total) by mouth every 8 (eight) hours as needed for nausea or vomiting. 20 tablet 0  ? OXYGEN Inhale 2 L into the lungs daily. 2 liters at night, 1 liter when doing activities, and no oxygen when at home during the day.    ? potassium chloride (KLOR-CON) 10 MEQ tablet TAKE 1 TABLET BY MOUTH  TWICE DAILY 180 tablet 3  ? PROVENTIL HFA 108 (90 Base) MCG/ACT inhaler Inhale 2 puffs into the lungs every 6 (six) hours as needed for wheezing or shortness of breath. 18 gram inhaler 3 each 1  ? rosuvastatin (CRESTOR) 20 MG tablet TAKE 1 TABLET BY MOUTH DAILY 90 tablet 3  ? sodium chloride (OCEAN) 0.65 % SOLN nasal spray Place 2 sprays into both nostrils as needed for congestion. 30 mL 11  ? vitamin C (ASCORBIC ACID) 500 MG tablet Take 1,000 mg by mouth daily.      ? Vitamin E 180 MG CAPS Take 1 capsule by mouth daily.    ? ?No current facility-administered medications on file prior to visit.  ? ? ?Allergies  ?Allergen Reactions  ? Augmentin [Amoxicillin-Pot Clavulan

## 2022-02-11 ENCOUNTER — Ambulatory Visit: Payer: Medicare Other | Admitting: Pulmonary Disease

## 2022-02-11 ENCOUNTER — Encounter: Payer: Self-pay | Admitting: Pulmonary Disease

## 2022-02-11 VITALS — BP 132/64 | HR 74 | Temp 97.6°F | Ht 66.0 in | Wt 250.6 lb

## 2022-02-11 DIAGNOSIS — J9611 Chronic respiratory failure with hypoxia: Secondary | ICD-10-CM | POA: Diagnosis not present

## 2022-02-11 DIAGNOSIS — I2510 Atherosclerotic heart disease of native coronary artery without angina pectoris: Secondary | ICD-10-CM | POA: Diagnosis not present

## 2022-02-11 DIAGNOSIS — J449 Chronic obstructive pulmonary disease, unspecified: Secondary | ICD-10-CM

## 2022-02-11 DIAGNOSIS — J9612 Chronic respiratory failure with hypercapnia: Secondary | ICD-10-CM

## 2022-02-11 DIAGNOSIS — Z87891 Personal history of nicotine dependence: Secondary | ICD-10-CM

## 2022-02-11 DIAGNOSIS — I2584 Coronary atherosclerosis due to calcified coronary lesion: Secondary | ICD-10-CM

## 2022-02-11 NOTE — Progress Notes (Signed)
Subjective:    Patient ID: Martha Woodard, female    DOB: 1966/04/13, 56 y.o.   MRN: [id number] Patient Care Team: Leone Haven, MD as PCP - General (Family Medicine) Bary Castilla, Forest Gleason, MD (General Surgery) Minna Merritts, MD as Consulting Physician (Cardiology)  Chief Complaint  Patient presents with   Follow-up    Prod cough with light yellow sputum.    PROBLEMS: Chronic hypoxic/hypercarbic respiratory failure Former smoker Moderate/severe COPD OHS CHF Multiple pulmonary nodules - stable > 2 yrs and deemed benign   DATA: 09/28/2015 2D echo: EF 60 to 65%, grade 1 DD, mild left atrial dilation.  Right-sided pressures normal. 02/28/2020 PFTs:FEV1 1.42L or 49%, FVC or 2.24L 60%, FEV1/FVC 63%, no bronchodilator response.  RV is 122% indicating air trapping.  Diffusion capacity normal.  Consistent with moderate to severe COPD. 04/05/2020 chest x-ray: No active disease.  Consistent with COPD, left base scarring/atelectasis. 01/21/2022 LDCT: Mild to moderate centrilobular emphysema.  Multiple bilateral pulmonary nodules of which the larger were present on 01/17/2016 exam.  Some new nodules from 2017 exam.  Coronary artery calcifications.  Lung RADS 3 short-term follow-up 6 months ordered   INTERVAL HISTORY: Last seen by me on 14 November 2021. No major exacerbations since then.  Only needs expressed today is replacement of her oxygen concentrator.  She is concerned with regards to her coronary artery calcification finding on low-dose CT.  Cardiologist is Dr. Rockey Situ.  HPI [name] presents today for follow-up asthma/COPD overlap syndrome.  She is well controlled on Bevespi and Pulmicort Flexhaler.  Not tolerate the Breztri combination as she occasionally has flares of thrush and has to discontinue the ICS component.  ICS component controls her cough and asthma symptoms.  She adjust this according to symptoms.  She is concerned about the coronary artery calcification finding on her  low-dose CT.  I have encouraged her to talk to her cardiologist Dr. Rockey Situ for further evaluation of this issue.  She has multiple lung nodules that were previously noted as far back as 2017 however there are some new ones from that last CT performed in 2017.  These will need to be followed up expectantly.  She has follow-up CT scan scheduled for 6 months.  She is compliant with oxygen therapy for chronic hypoxic/hypercarbic respiratory failure due to COPD and obesity with obesity hypoventilation.  She has not had any fevers, chills or sweats.  No purulent sputum production.  Overall she feels well and looks well.   Review of Systems A 10 point review of systems was performed and it is as noted above otherwise negative.  Patient Active Problem List   Diagnosis Date Noted   Sinusitis 01/06/2022   Hot flashes 05/24/2021   Foot cramps 02/20/2021   De Quervain's tenosynovitis, left 11/23/2020   Papilloma of oral cavity 12/13/2018   Seborrheic dermatitis 10/02/2018   Light headedness 06/17/2018   Foot pain 06/17/2018   Allergic rhinitis 11/09/2017   Venous insufficiency 08/07/2017   Chronic respiratory failure ([place]) 11/13/2016   Skin cyst 08/19/2016   Thyroid nodule 11/28/2015   COPD (chronic obstructive pulmonary disease) with emphysema ([place]) 11/13/2015   Chronic diastolic heart failure ([place]) 11/13/2015   Morbid obesity with BMI of 45.0-49.9, adult ([place]) 10/01/2015   Obesity hypoventilation syndrome ([place]) 10/01/2015   Eczema 10/01/2015   Essential hypertension 10/01/2015   Palpitations 06/26/2014   DM type 2 (diabetes mellitus, type 2) ([place]) 06/26/2014   Hyperlipidemia 06/26/2014   Lung nodule seen on imaging  study 02/06/2014   Social History   Tobacco Use   Smoking status: Former    Packs/day: 3.00    Years: 34.00    Pack years: 102.00    Types: Cigarettes    Quit date: 07/10/2013    Years since quitting: 8.5   Smokeless tobacco: Never  Substance Use Topics   Alcohol use:  No    Alcohol/week: 0.0 standard drinks   Allergies  Allergen Reactions   Augmentin [Amoxicillin-Pot Clavulanate] Itching    Hives Can take PCN and Amoxicillin alone    Current Meds  Medication Sig   ACCU-CHEK AVIVA PLUS test strip TEST UP TO 4 TIMES DAILY AS DIRECTED   Accu-Chek Softclix Lancets lancets TEST UP TO 4 TIMES DAILY   acetaminophen (TYLENOL) 650 MG CR tablet Take 650 mg by mouth every 8 (eight) hours as needed for pain.   albuterol (PROVENTIL) (2.5 MG/3ML) 0.083% nebulizer solution Take 3 mLs (2.5 mg total) by nebulization every 6 (six) hours as needed for wheezing or shortness of breath.   aspirin EC 81 MG tablet Take 1 tablet (81 mg total) by mouth daily.   blood glucose meter kit and supplies Dispense based on patient and insurance preference. Use up to four times daily as directed. (FOR ICD-9 250.00, 250.01).   budesonide (PULMICORT FLEXHALER) 180 MCG/ACT inhaler Inhale 1-2 puffs into the lungs 2 (two) times daily.   calcium-vitamin D (OSCAL WITH D) 500-200 MG-UNIT tablet Take 1 tablet by mouth 2 (two) times daily.   cetirizine (ZYRTEC) 10 MG tablet Take 10 mg by mouth daily.   dapagliflozin propanediol (FARXIGA) 10 MG TABS tablet Take 1 tablet (10 mg total) by mouth daily before breakfast.   fluticasone (FLONASE) 50 MCG/ACT nasal spray USE 1 OR 2 SPRAYS IN EACH NOSTRIL ONCE DAILY.   furosemide (LASIX) 20 MG tablet Take 1 tablet (20 mg total) by mouth daily.   gabapentin (NEURONTIN) 100 MG capsule TAKE 1 CAPSULE BY MOUTH 3 TIMES  DAILY   Glycopyrrolate-Formoterol (BEVESPI AEROSPHERE) 9-4.8 MCG/ACT AERO Inhale 2 puffs into the lungs 2 (two) times daily.   hydrochlorothiazide (MICROZIDE) 12.5 MG capsule TAKE 1 CAPSULE BY MOUTH DAILY   ketoconazole (NIZORAL) 2 % cream Apply 1 application topically daily.   ketoconazole (NIZORAL) 2 % shampoo Apply 1 application topically 2 (two) times a week.   losartan (COZAAR) 25 MG tablet TAKE 1 TABLET BY MOUTH TWICE  DAILY   magnesium  oxide (MAG-OX) 400 MG tablet Take 400 mg by mouth daily.   metFORMIN (GLUCOPHAGE) 500 MG tablet TAKE 2 TABLETS BY MOUTH  TWICE DAILY WITH MEALS   metoprolol succinate (TOPROL-XL) 25 MG 24 hr tablet TAKE 1 TABLET BY MOUTH ONCE  DAILY   montelukast (SINGULAIR) 10 MG tablet TAKE 1 TABLET BY MOUTH AT  BEDTIME   Multiple Vitamin (MULTIVITAMIN) tablet Take 1 tablet by mouth daily.   nystatin (MYCOSTATIN) 100000 UNIT/ML suspension Take 5 mLs (500,000 Units total) by mouth 4 (four) times daily. X 7-10 days   nystatin (MYCOSTATIN/NYSTOP) powder Apply 1 application topically 2 (two) times daily.   nystatin cream (MYCOSTATIN) Apply 1 application topically 2 (two) times daily.   ondansetron (ZOFRAN ODT) 4 MG disintegrating tablet Take 1 tablet (4 mg total) by mouth every 8 (eight) hours as needed for nausea or vomiting.   OXYGEN Inhale 2 L into the lungs daily. 2 liters at night, 1 liter when doing activities, and no oxygen when at home during the day.   potassium chloride (KLOR-CON)  10 MEQ tablet TAKE 1 TABLET BY MOUTH  TWICE DAILY   PROVENTIL HFA 108 (90 Base) MCG/ACT inhaler Inhale 2 puffs into the lungs every 6 (six) hours as needed for wheezing or shortness of breath. 18 gram inhaler   rosuvastatin (CRESTOR) 20 MG tablet TAKE 1 TABLET BY MOUTH DAILY   sodium chloride (OCEAN) 0.65 % SOLN nasal spray Place 2 sprays into both nostrils as needed for congestion.   vitamin C (ASCORBIC ACID) 500 MG tablet Take 1,000 mg by mouth daily.    Vitamin E 180 MG CAPS Take 1 capsule by mouth daily.   [DISCONTINUED] doxycycline (VIBRA-TABS) 100 MG tablet Take 1 tablet (100 mg total) by mouth 2 (two) times daily.   Immunization History  Administered Date(s) Administered   Influenza,inj,Quad PF,6+ Mos 07/02/2015, 08/19/2016, 08/07/2017, 06/16/2018, 06/29/2019, 07/31/2020, 07/05/2021   Moderna SARS-COV2 Booster Vaccination 11/05/2020   Moderna Sars-Covid-2 Vaccination 03/08/2020, 04/05/2020   Pneumococcal  Polysaccharide-23 09/26/2015   Pneumococcal-Unspecified 11/23/2020      Objective:   Physical Exam BP 132/64 (BP Location: Left Arm, Cuff Size: Large)   Pulse 74   Temp 97.6 F (36.4 C) (Temporal)   Ht _0  (1.676 m)   Wt 250 lb 9.6 oz (113.7 kg)   LMP 04/10/2014   SpO2 94%   BMI 40.45 kg/m  GENERAL: Obese woman, no acute distress, comfortable with nasal cannula O2.  Fully ambulatory. HEAD: Normocephalic, atraumatic. EYES: Pupils equal, round, reactive to light.  No scleral icterus. MOUTH: Nose/mouth/throat not examined due to masking requirements for COVID 19. NECK: Supple. No thyromegaly. Trachea midline. No JVD.  No adenopathy. PULMONARY: Mildly diminished breath sounds.  No adventitious sounds, lungs sound clear.  CARDIOVASCULAR: S1 and S2. Regular rate and rhythm.  No rubs, murmurs or gallops heard. ABDOMEN: Obese, otherwise benign. MUSCULOSKELETAL: No joint deformity, no clubbing, no edema. NEUROLOGIC: No focal deficit, no gait disturbance, speech is fluent. SKIN: Intact,warm,dry. PSYCH: Mood and behavior normal   Imaging studies were reviewed today the patient.      Assessment & Plan:     ICD-10-CM   1. Asthma-COPD overlap syndrome (HCC)  J44.9 AMB REFERRAL FOR DME   Continue Bevespi and Pulmicort Flexhaler Continue as needed albuterol Follow-up 4 months time or as needed    2. Chronic respiratory failure with hypoxia and hypercapnia (HCC)  J96.11    J96.12    Continue oxygen supplementation Will need new concentrator, order sent to [facility] 2 L at nighttime, 1 L on exertion    3. Coronary artery calcification  I25.10    I25.84    Noted on LDCT Encourage patient to reach out to her cardiologist, Dr. Rockey Situ    4. Former very heavy cigarette smoker (more than 40 per day)  Z87.891    Patient remains abstinent of cigarettes Enrolled on cancer screening program     Orders Placed This Encounter  Procedures   AMB REFERRAL FOR DME    Referral Priority:    Routine    Referral Type:   Durable Medical Equipment Purchase    Number of Visits Requested:   1   Patient will be seen in follow-up in 4 months time she is to contact us prior to that time should any new difficulties arise.  Renold Don, MD Advanced Bronchoscopy PCCM [facility]    *This note was dictated using voice recognition software/Dragon.  Despite best efforts to proofread, errors can occur which can change the meaning. Any transcriptional errors that result from this  process are unintentional and may not be fully corrected at the time of dictation.

## 2022-02-11 NOTE — Patient Instructions (Signed)
With regards to your artery calcifications in your heart it would be good if you make an appointment with Dr. Rockey Situ to go over those findings and see if you need further studies. ? ?With regards to your low-dose CT your CT will be repeated through the lung cancer screening program.  This has already been scheduled and you will be notified closer to the time of the test. ? ?Continue using your Bevespi and Pulmicort. ? ?We will see you in follow-up in 4 months time call sooner should any new problems arise. ?

## 2022-02-11 NOTE — Progress Notes (Signed)
Date:  02/14/2022   ID:  Rosha, Cocker [id number], MRN [id number]  Patient Location:  [place] [id number]   Provider location:   [facility], [place] office  PCP:  Leone Haven, MD  Cardiologist:  Arvid Right [facility]  Chief Complaint  Patient presents with   Follow up CT scan     "Doing well." Medications reviewed by the patient verbally.     History of Present Illness:    Martha Woodard is a 56 y.o. female  past medical history of diabetes,  asthma,  COPD, Quit smoking 2014, smoked for >30 yrs lung nodules  hospital for acute respiratory distress, 20 pound weight gain, hypoxia, diagnosed with COPD exacerbation and acute diastolic CHF  Chronic LE edema>L obesity hypoventilation Ejection fraction 60% in December 2016 She presents for follow-up of her diastolic CHF, coronary calcification on CT scan  Last seen by myself in clinic 3/23 Chest CT January 21, 2022, coronary calcification noted  Images pulled up and reviewed on today's visit Very mild coronary calcification Very mild aortic atherosclerosis  A1c in the high 6 range, recently started on Farxiga to improve numbers Cholesterol at goal Denies anginal symptoms  Labs reviewed A1C: 6.9 Total chol 109, LDL 40 CR 0.67   stopped smoking 06/2013  EKG personally reviewed by myself on todays visit Normal sinus rhythm rate 76 bpm no significant ST-T wave changes  Other past medical hx COVID-19 + December 2020 Sore throat, coughing, some shortness of breath, had to use oxygen at times. Felt like bronchiits Sinus infection has persisted No fever currently PMD called prednisone, may need abx  Prior episode of hypoxic respiratory distress Previous CT chest  Bilateral lower lobe atelectasis, pulmonary edema versus interstitial pneumonitis superimposed on changes of COPD and right lung nodules    initiated on steroids, antibiotics, inhalation therapy as well as  diuretics.     hypoxic intermittently, especially at nighttime and sitting in the bed, concerning for obesity hypoventilation syndrome.     Past Medical History:  Diagnosis Date   CHF (congestive heart failure) ([place])    Colon polyp 01-01-2016   COPD (chronic obstructive pulmonary disease) (HCC)    Diabetes mellitus without complication (HCC)    Diastolic heart failure (HCC)    HLD (hyperlipidemia)    Hypertension    Morbid obesity with BMI of 45.0-49.9, adult ([place])    Multiple lung nodules on CT    Shortness of breath dyspnea    Past Surgical History:  Procedure Laterality Date   BIOPSY THYROID Right 02-06-14   NEGATIVE FOR MALIGNANT CELLS proteinaceous material and macrophages.   BREAST BIOPSY Left    neg- core   COLONOSCOPY WITH PROPOFOL N/A 01/01/2016   Procedure: COLONOSCOPY WITH PROPOFOL;  Surgeon: Lucilla Lame, MD;  Location: [facility] ENDOSCOPY;  Service: Endoscopy;  Laterality: N/A;   thryoid fna Right April 2015   Proteinaceous material and macrophages.   WISDOM TOOTH EXTRACTION      Allergies:   Augmentin [amoxicillin-pot clavulanate]   Social History   Tobacco Use   Smoking status: Former    Packs/day: 3.00    Years: 34.00    Pack years: 102.00    Types: Cigarettes    Quit date: 07/10/2013    Years since quitting: 8.6   Smokeless tobacco: Never  Vaping Use   Vaping Use: Former  Substance Use Topics   Alcohol use: No    Alcohol/week: 0.0  standard drinks   Drug use: No     Current Outpatient Medications on File Prior to Visit  Medication Sig Dispense Refill   ACCU-CHEK AVIVA PLUS test strip TEST UP TO 4 TIMES DAILY AS DIRECTED 400 strip 3   Accu-Chek Softclix Lancets lancets TEST UP TO 4 TIMES DAILY 400 each 3   acetaminophen (TYLENOL) 650 MG CR tablet Take 650 mg by mouth every 8 (eight) hours as needed for pain.     albuterol (PROVENTIL) (2.5 MG/3ML) 0.083% nebulizer solution Take 3 mLs (2.5 mg total) by nebulization every 6 (six) hours as needed for wheezing  or shortness of breath. 75 mL 12   aspirin EC 81 MG tablet Take 1 tablet (81 mg total) by mouth daily. 90 tablet 3   blood glucose meter kit and supplies Dispense based on patient and insurance preference. Use up to four times daily as directed. (FOR ICD-9 250.00, 250.01). 1 each 0   budesonide (PULMICORT FLEXHALER) 180 MCG/ACT inhaler Inhale 1-2 puffs into the lungs 2 (two) times daily. 3 each 3   calcium-vitamin D (OSCAL WITH D) 500-200 MG-UNIT tablet Take 1 tablet by mouth 2 (two) times daily.     cetirizine (ZYRTEC) 10 MG tablet Take 10 mg by mouth daily.     dapagliflozin propanediol (FARXIGA) 10 MG TABS tablet Take 1 tablet (10 mg total) by mouth daily before breakfast. 90 tablet 3   fluticasone (FLONASE) 50 MCG/ACT nasal spray USE 1 OR 2 SPRAYS IN EACH NOSTRIL ONCE DAILY. 16 g 12   furosemide (LASIX) 20 MG tablet Take 1 tablet (20 mg total) by mouth daily. 90 tablet 3   gabapentin (NEURONTIN) 100 MG capsule TAKE 1 CAPSULE BY MOUTH 3 TIMES  DAILY 270 capsule 3   Glycopyrrolate-Formoterol (BEVESPI AEROSPHERE) 9-4.8 MCG/ACT AERO Inhale 2 puffs into the lungs 2 (two) times daily. 10.7 g 11   hydrochlorothiazide (MICROZIDE) 12.5 MG capsule TAKE 1 CAPSULE BY MOUTH DAILY 90 capsule 3   ketoconazole (NIZORAL) 2 % cream Apply 1 application topically daily. 15 g 0   ketoconazole (NIZORAL) 2 % shampoo Apply 1 application topically 2 (two) times a week. 120 mL 0   losartan (COZAAR) 25 MG tablet TAKE 1 TABLET BY MOUTH TWICE  DAILY 180 tablet 3   magnesium oxide (MAG-OX) 400 MG tablet Take 400 mg by mouth daily.     metFORMIN (GLUCOPHAGE) 500 MG tablet TAKE 2 TABLETS BY MOUTH  TWICE DAILY WITH MEALS 360 tablet 3   metoprolol succinate (TOPROL-XL) 25 MG 24 hr tablet TAKE 1 TABLET BY MOUTH ONCE  DAILY 90 tablet 3   montelukast (SINGULAIR) 10 MG tablet TAKE 1 TABLET BY MOUTH AT  BEDTIME 30 tablet 11   Multiple Vitamin (MULTIVITAMIN) tablet Take 1 tablet by mouth daily.     nystatin (MYCOSTATIN) 100000  UNIT/ML suspension Take 5 mLs (500,000 Units total) by mouth 4 (four) times daily. X 7-10 days 473 mL 0   nystatin (MYCOSTATIN/NYSTOP) powder Apply 1 application topically 2 (two) times daily. 60 g 11   nystatin cream (MYCOSTATIN) Apply 1 application topically 2 (two) times daily. 30 g 0   ondansetron (ZOFRAN ODT) 4 MG disintegrating tablet Take 1 tablet (4 mg total) by mouth every 8 (eight) hours as needed for nausea or vomiting. 20 tablet 0   OXYGEN Inhale 2 L into the lungs daily. 2 liters at night, 1 liter when doing activities, and no oxygen when at home during the day.     potassium  chloride (KLOR-CON) 10 MEQ tablet TAKE 1 TABLET BY MOUTH  TWICE DAILY 180 tablet 3   PROVENTIL HFA 108 (90 Base) MCG/ACT inhaler Inhale 2 puffs into the lungs every 6 (six) hours as needed for wheezing or shortness of breath. 18 gram inhaler 3 each 1   rosuvastatin (CRESTOR) 20 MG tablet TAKE 1 TABLET BY MOUTH DAILY 90 tablet 3   sodium chloride (OCEAN) 0.65 % SOLN nasal spray Place 2 sprays into both nostrils as needed for congestion. 30 mL 11   vitamin C (ASCORBIC ACID) 500 MG tablet Take 1,000 mg by mouth daily.      Vitamin E 180 MG CAPS Take 1 capsule by mouth daily.     No current facility-administered medications on file prior to visit.     Family Hx: The patient's family history includes Breast cancer (age of onset: 32) in her sister; Breast cancer (age of onset: 75) in her sister; Cancer (age of onset: 67) in her mother; Goiter in her mother; Heart murmur in her sister; Stroke in her brother and father.  ROS:   Please see the history of present illness.    Review of Systems  Constitutional: Negative.   HENT: Negative.    Respiratory:  Positive for shortness of breath.   Cardiovascular: Negative.   Gastrointestinal: Negative.   Musculoskeletal: Negative.   Neurological: Negative.   Psychiatric/Behavioral: Negative.    All other systems reviewed and are negative.   Labs/Other Tests and Data  Reviewed:    Recent Labs: 05/24/2021: Hemoglobin 14.3; Platelets 260.0; TSH 0.90 11/26/2021: ALT 37; BUN 13; Creatinine, Ser 0.67; Potassium 4.1; Sodium 142   Recent Lipid Panel Lab Results  Component Value Date/Time   CHOL 109 11/26/2021 09:55 AM   TRIG 114.0 11/26/2021 09:55 AM   HDL 45.90 11/26/2021 09:55 AM   CHOLHDL 2 11/26/2021 09:55 AM   LDLCALC 40 11/26/2021 09:55 AM   LDLDIRECT 57.0 07/27/2018 09:20 AM    Wt Readings from Last 3 Encounters:  02/14/22 253 lb (114.8 kg)  02/11/22 250 lb 9.6 oz (113.7 kg)  01/06/22 258 lb 12.8 oz (117.4 kg)     Exam:    Vital Signs: Vital signs may also be detailed in the HPI BP 118/60 (BP Location: Left Arm, Patient Position: Sitting, Cuff Size: Large)   Ht _0  (1.676 m)   Wt 253 lb (114.8 kg)   LMP 04/10/2014   SpO2 97% Comment: oxygen at 1 Liter  BMI 40.84 kg/m   Constitutional:  oriented to person, place, and time. No distress.  HENT:  Head: Grossly normal Eyes:  no discharge. No scleral icterus.  Neck: No JVD, no carotid bruits  Cardiovascular: Regular rate and rhythm, no murmurs appreciated Pulmonary/Chest: Clear to auscultation bilaterally, no wheezes or rails Abdominal: Soft.  no distension.  no tenderness.  Musculoskeletal: Normal range of motion Neurological:  normal muscle tone. Coordination normal. No atrophy Skin: Skin warm and dry Psychiatric: normal affect, pleasant   ASSESSMENT & PLAN:    Problem List Items Addressed This Visit       Cardiology Problems   Essential hypertension   Chronic diastolic heart failure (HCC) - Primary   Relevant Orders   EKG 12-Lead   Hyperlipidemia     Other   DM type 2 (diabetes mellitus, type 2) (HCC)   COPD (chronic obstructive pulmonary disease) with emphysema (HCC)   Relevant Orders   EKG 12-Lead   Obesity hypoventilation syndrome ([place])   Relevant Orders   EKG  12-Lead   Other Visit Diagnoses     Coronary artery calcification seen on CT scan       Relevant  Orders   EKG 12-Lead   PAC (premature atrial contraction)          Coronary calcification Noted on CT chest, mild in nature, images pulled up and reviewed No indication for further testing, denies anginal symptoms A1c improving, close to goal, rest while at goal, current non-smoker  COPD: Stopped smoking many years ago, maintained on oxygen, periodic COPD exacerbations Followed by pulmonary  Obesity hypoventilation: We have encouraged continued exercise, careful diet management in an effort to lose weight.  Diastolic CHF Taking Lasix as needed On low-dose HCTZ daily BMP stable  Diabetes:  HBA1C 6.9 Recommend calorie restriction, recently started on Farxiga   Total encounter time more than 30 minutes  Greater than 50% was spent in counseling and coordination of care with the patient   Signed, Ida Rogue, [place] [address], [place], [place] [address]

## 2022-02-14 ENCOUNTER — Encounter: Payer: Self-pay | Admitting: Cardiovascular Disease

## 2022-02-14 ENCOUNTER — Ambulatory Visit: Payer: Medicare Other | Admitting: Cardiovascular Disease

## 2022-02-14 VITALS — BP 118/60 | Ht 66.0 in | Wt 253.0 lb

## 2022-02-14 DIAGNOSIS — E662 Morbid (severe) obesity with alveolar hypoventilation: Secondary | ICD-10-CM | POA: Diagnosis not present

## 2022-02-14 DIAGNOSIS — I5032 Chronic diastolic (congestive) heart failure: Secondary | ICD-10-CM | POA: Diagnosis not present

## 2022-02-14 DIAGNOSIS — I10 Essential (primary) hypertension: Secondary | ICD-10-CM | POA: Diagnosis not present

## 2022-02-14 DIAGNOSIS — E1159 Type 2 diabetes mellitus with other circulatory complications: Secondary | ICD-10-CM

## 2022-02-14 DIAGNOSIS — I2510 Atherosclerotic heart disease of native coronary artery without angina pectoris: Secondary | ICD-10-CM

## 2022-02-14 DIAGNOSIS — E782 Mixed hyperlipidemia: Secondary | ICD-10-CM | POA: Diagnosis not present

## 2022-02-14 DIAGNOSIS — I491 Atrial premature depolarization: Secondary | ICD-10-CM | POA: Diagnosis not present

## 2022-02-14 DIAGNOSIS — J432 Centrilobular emphysema: Secondary | ICD-10-CM

## 2022-02-14 NOTE — Patient Instructions (Signed)
Medication Instructions:  No changes  If you need a refill on your cardiac medications before your next appointment, please call your pharmacy.   Lab work: No new labs needed  Testing/Procedures: No new testing needed  Follow-Up: At [facility], you and your health needs are our priority.  As part of our continuing mission to provide you with exceptional heart care, we have created designated Provider Care Teams.  These Care Teams include your primary Cardiologist (physician) and Advanced Practice Providers (APPs -  Physician Assistants and Nurse Practitioners) who all work together to provide you with the care you need, when you need it.  You will need a follow up appointment in 12 months  Providers on your designated Care Team:   Christopher Berge, NP Ryan Dunn, PA-C Cadence Furth, PA-C  COVID-19 Vaccine Information can be found at: [email or web address] For questions related to vaccine distribution or appointments, please email [email or web address] or call [phone].   

## 2022-02-18 ENCOUNTER — Encounter: Payer: Self-pay | Admitting: Pulmonary Disease

## 2022-02-24 ENCOUNTER — Other Ambulatory Visit: Payer: Self-pay | Admitting: Family Medicine

## 2022-03-12 ENCOUNTER — Other Ambulatory Visit: Payer: Self-pay | Admitting: Family Medicine

## 2022-03-12 DIAGNOSIS — J309 Allergic rhinitis, unspecified: Secondary | ICD-10-CM

## 2022-03-13 ENCOUNTER — Other Ambulatory Visit: Payer: Self-pay | Admitting: Family Medicine

## 2022-03-13 DIAGNOSIS — J309 Allergic rhinitis, unspecified: Secondary | ICD-10-CM

## 2022-03-14 ENCOUNTER — Telehealth: Payer: Self-pay | Admitting: Pulmonary Disease

## 2022-03-14 DIAGNOSIS — J449 Chronic obstructive pulmonary disease, unspecified: Secondary | ICD-10-CM

## 2022-03-14 NOTE — Telephone Encounter (Signed)
Order placed to [facility]. Patient is aware and voiced her understanding.  Nothing further needed.

## 2022-03-14 NOTE — Telephone Encounter (Signed)
Mrs. Martha Woodard called my phone today asking  if we could change her 02 to another [place] she is tired of the run around she is getting from [facility].   She is not happy with them. I have spoke with Dr. Patsey Berthold and she is ok to order 02 through [place] or [place]

## 2022-03-18 DIAGNOSIS — J449 Chronic obstructive pulmonary disease, unspecified: Secondary | ICD-10-CM | POA: Diagnosis not present

## 2022-04-07 ENCOUNTER — Encounter: Payer: Self-pay | Admitting: Family Medicine

## 2022-04-07 ENCOUNTER — Ambulatory Visit (INDEPENDENT_AMBULATORY_CARE_PROVIDER_SITE_OTHER): Payer: Medicare Other | Admitting: Family Medicine

## 2022-04-07 VITALS — BP 130/80 | HR 74 | Temp 98.7°F | Ht 66.0 in | Wt 252.0 lb

## 2022-04-07 DIAGNOSIS — R600 Localized edema: Secondary | ICD-10-CM | POA: Diagnosis not present

## 2022-04-07 DIAGNOSIS — E782 Mixed hyperlipidemia: Secondary | ICD-10-CM

## 2022-04-07 DIAGNOSIS — D179 Benign lipomatous neoplasm, unspecified: Secondary | ICD-10-CM | POA: Insufficient documentation

## 2022-04-07 DIAGNOSIS — E041 Nontoxic single thyroid nodule: Secondary | ICD-10-CM

## 2022-04-07 DIAGNOSIS — E1159 Type 2 diabetes mellitus with other circulatory complications: Secondary | ICD-10-CM | POA: Diagnosis not present

## 2022-04-07 DIAGNOSIS — D1721 Benign lipomatous neoplasm of skin and subcutaneous tissue of right arm: Secondary | ICD-10-CM

## 2022-04-07 DIAGNOSIS — Z1231 Encounter for screening mammogram for malignant neoplasm of breast: Secondary | ICD-10-CM | POA: Diagnosis not present

## 2022-04-07 LAB — MICROALBUMIN / CREATININE URINE RATIO
Creatinine,U: 13.2 mg/dL
Microalb Creat Ratio: 5.3 mg/g (ref 0.0–30.0)
Microalb, Ur: <0.7 mg/dL (ref 0.0–1.9)

## 2022-04-07 LAB — POCT GLYCOSYLATED HEMOGLOBIN (HGB A1C): Hemoglobin A1C: 6.1 % — AB (ref 4.0–5.6)

## 2022-04-07 NOTE — Assessment & Plan Note (Signed)
Continue Crestor 20 mg daily. 

## 2022-04-07 NOTE — Patient Instructions (Addendum)
Nice to see you. Please call [phone] to schedule your mammogram. If you do not hear about your ultrasounds being scheduled please let us know.

## 2022-04-07 NOTE — Progress Notes (Signed)
fol

## 2022-04-07 NOTE — Assessment & Plan Note (Addendum)
Check A1c.  Check urine microalbumin.  She will continue metformin 1000 mg twice daily.  We did discuss Ozempic and Mounjaro.  She was counseled on the risk of pancreatitis and gallbladder disease with these medications.  Discussed the risk of nausea.  They were advised to discontinue the Ozempic or Mounjaro and contact us immediately if they develop abdominal pain.  If they develop excessive nausea they will contact us right away.  I discussed that medullary thyroid cancer has been seen in rats studies.  The patient confirmed no personal or family history of thyroid cancer, parathyroid cancer, or adrenal gland cancer.  Discussed that we thus far have not seen medullary thyroid cancer result from use of this type of medication in humans.  They were advised to monitor the thyroid area and contact us for any lumps, swelling, trouble swallowing, or any other changes in this area.  If needed we will plan on starting 1 of these medications based on her A1c result if her thyroid ultrasound permits this.

## 2022-04-07 NOTE — Assessment & Plan Note (Signed)
Patient's forearm lesion appears to be a lipoma.  Discussed this was benign.  She will monitor for development of pain, enlargement, or development of firmness of the lipoma.  If those occur she will let us know.

## 2022-04-07 NOTE — Assessment & Plan Note (Signed)
Check ultrasound.

## 2022-04-07 NOTE — Progress Notes (Signed)
[name], MD Phone: [phone]  Martha Woodard is a 56 y.o. female who presents today for f/u.  DIABETES Disease Monitoring: Blood Sugar ranges-90s on farxiga, 114 this morning since stopped farxiga Polyuria/phagia/dipsia- chronic polydipsia      Optho- UTD Medications: Compliance- taking metformin, stopped farxiga related to genital yeast infection Hypoglycemic symptoms- noted while on farxiga  HYPERLIPIDEMIA Symptoms Chest pain on exertion:  no   Leg claudication:   no Medications: Compliance- taking crestor Right upper quadrant pain- no  Muscle aches- no Lipid Panel     Component Value Date/Time   CHOL 109 11/26/2021 0955   TRIG 114.0 11/26/2021 0955   HDL 45.90 11/26/2021 0955   CHOLHDL 2 11/26/2021 0955   VLDL 22.8 11/26/2021 0955   LDLCALC 40 11/26/2021 0955   LDLDIRECT 57.0 07/27/2018 0920   Thyroid nodule: Patient notes she had biopsies previously.  She is overdue for follow-up imaging.  Submandibular gland swelling: Patient notes this has been going on for quite some time.  There is no pain associated with this.  Right forearm lump: Patient notes this has been present for quite some time.  It has not enlarged.  It is not painful.  It is soft.   Social History   Tobacco Use  Smoking Status Former   Packs/day: 3.00   Years: 34.00   Total pack years: 102.00   Types: Cigarettes   Quit date: 07/10/2013   Years since quitting: 8.7  Smokeless Tobacco Never    Current Outpatient Medications on File Prior to Visit  Medication Sig Dispense Refill   ACCU-CHEK AVIVA PLUS test strip TEST UP TO 4 TIMES DAILY AS DIRECTED 400 strip 3   Accu-Chek Softclix Lancets lancets TEST UP TO 4 TIMES DAILY 400 each 3   acetaminophen (TYLENOL) 650 MG CR tablet Take 650 mg by mouth every 8 (eight) hours as needed for pain.     albuterol (PROVENTIL) (2.5 MG/3ML) 0.083% nebulizer solution Take 3 mLs (2.5 mg total) by nebulization every 6 (six) hours as needed for wheezing or  shortness of breath. 75 mL 12   aspirin EC 81 MG tablet Take 1 tablet (81 mg total) by mouth daily. 90 tablet 3   blood glucose meter kit and supplies Dispense based on patient and insurance preference. Use up to four times daily as directed. (FOR ICD-9 250.00, 250.01). 1 each 0   budesonide (PULMICORT FLEXHALER) 180 MCG/ACT inhaler Inhale 1-2 puffs into the lungs 2 (two) times daily. 3 each 3   calcium-vitamin D (OSCAL WITH D) 500-200 MG-UNIT tablet Take 1 tablet by mouth 2 (two) times daily.     cetirizine (ZYRTEC) 10 MG tablet Take 10 mg by mouth daily.     dapagliflozin propanediol (FARXIGA) 10 MG TABS tablet Take 1 tablet (10 mg total) by mouth daily before breakfast. 90 tablet 3   fluticasone (FLONASE) 50 MCG/ACT nasal spray USE 1 OR 2 SPRAYS IN EACH NOSTRIL ONCE DAILY. 16 g 12   furosemide (LASIX) 20 MG tablet Take 1 tablet (20 mg total) by mouth daily. 90 tablet 3   gabapentin (NEURONTIN) 100 MG capsule TAKE 1 CAPSULE BY MOUTH 3 TIMES  DAILY 270 capsule 3   Glycopyrrolate-Formoterol (BEVESPI AEROSPHERE) 9-4.8 MCG/ACT AERO Inhale 2 puffs into the lungs 2 (two) times daily. 10.7 g 11   hydrochlorothiazide (MICROZIDE) 12.5 MG capsule TAKE 1 CAPSULE BY MOUTH DAILY 90 capsule 3   ketoconazole (NIZORAL) 2 % cream Apply 1 application topically daily. 15 g 0  ketoconazole (NIZORAL) 2 % shampoo Apply 1 application topically 2 (two) times a week. 120 mL 0   losartan (COZAAR) 25 MG tablet TAKE 1 TABLET BY MOUTH TWICE  DAILY 180 tablet 3   magnesium oxide (MAG-OX) 400 MG tablet Take 400 mg by mouth daily.     meloxicam (MOBIC) 15 MG tablet Take 1 tablet every day by oral route.     metFORMIN (GLUCOPHAGE) 500 MG tablet TAKE 2 TABLETS BY MOUTH  TWICE DAILY WITH MEALS 360 tablet 3   metoprolol succinate (TOPROL-XL) 25 MG 24 hr tablet TAKE 1 TABLET BY MOUTH ONCE  DAILY 90 tablet 3   montelukast (SINGULAIR) 10 MG tablet TAKE 1 TABLET BY MOUTH AT  BEDTIME 90 tablet 3   Multiple Vitamin (MULTIVITAMIN)  tablet Take 1 tablet by mouth daily.     nystatin (MYCOSTATIN) 100000 UNIT/ML suspension Take 5 mLs (500,000 Units total) by mouth 4 (four) times daily. X 7-10 days 473 mL 0   nystatin (MYCOSTATIN/NYSTOP) powder Apply 1 application topically 2 (two) times daily. 60 g 11   nystatin cream (MYCOSTATIN) Apply 1 application topically 2 (two) times daily. 30 g 0   ondansetron (ZOFRAN ODT) 4 MG disintegrating tablet Take 1 tablet (4 mg total) by mouth every 8 (eight) hours as needed for nausea or vomiting. 20 tablet 0   OXYGEN Inhale 2 L into the lungs daily. 2 liters at night, 1 liter when doing activities, and no oxygen when at home during the day.     potassium chloride (KLOR-CON) 10 MEQ tablet TAKE 1 TABLET BY MOUTH  TWICE DAILY 180 tablet 3   PROVENTIL HFA 108 (90 Base) MCG/ACT inhaler Inhale 2 puffs into the lungs every 6 (six) hours as needed for wheezing or shortness of breath. 18 gram inhaler 3 each 1   rosuvastatin (CRESTOR) 20 MG tablet TAKE 1 TABLET BY MOUTH DAILY 90 tablet 3   sodium chloride (OCEAN) 0.65 % SOLN nasal spray Place 2 sprays into both nostrils as needed for congestion. 30 mL 11   vitamin C (ASCORBIC ACID) 500 MG tablet Take 1,000 mg by mouth daily.      Vitamin E 180 MG CAPS Take 1 capsule by mouth daily.     No current facility-administered medications on file prior to visit.     ROS see history of present illness  Objective  Physical Exam Vitals:   04/07/22 1008  BP: 130/80  Pulse: 74  Temp: 98.7 F (37.1 C)  SpO2: 94%    BP Readings from Last 3 Encounters:  04/07/22 130/80  02/14/22 118/60  02/11/22 132/64   Wt Readings from Last 3 Encounters:  04/07/22 252 lb (114.3 kg)  02/14/22 253 lb (114.8 kg)  02/11/22 250 lb 9.6 oz (113.7 kg)    Physical Exam Constitutional:      General: She is not in acute distress.    Appearance: She is not diaphoretic.  Neck:     Comments: Submandibular glands seem to be somewhat enlarged, nontender Cardiovascular:      Rate and Rhythm: Normal rate and regular rhythm.     Heart sounds: Normal heart sounds.  Pulmonary:     Effort: Pulmonary effort is normal.     Breath sounds: Normal breath sounds.  Musculoskeletal:     Comments: Right proximal volar forearm subcutaneous soft tissue lesion noted it is soft and fatty in consistency  Skin:    General: Skin is warm and dry.  Neurological:     Mental  Status: She is alert.      Assessment/Plan: Please see individual problem list.  Problem List Items Addressed This Visit     DM type 2 (diabetes mellitus, type 2) (HCC) (Chronic)    Check A1c.  Check urine microalbumin.  She will continue metformin 1000 mg twice daily.  We did discuss Ozempic and Mounjaro.  She was counseled on the risk of pancreatitis and gallbladder disease with these medications.  Discussed the risk of nausea.  They were advised to discontinue the Ozempic or Mounjaro and contact us immediately if they develop abdominal pain.  If they develop excessive nausea they will contact us right away.  I discussed that medullary thyroid cancer has been seen in rats studies.  The patient confirmed no personal or family history of thyroid cancer, parathyroid cancer, or adrenal gland cancer.  Discussed that we thus far have not seen medullary thyroid cancer result from use of this type of medication in humans.  They were advised to monitor the thyroid area and contact us for any lumps, swelling, trouble swallowing, or any other changes in this area.  If needed we will plan on starting 1 of these medications based on her A1c result if her thyroid ultrasound permits this.      Relevant Orders   POCT HgB A1C (Completed)   Urine Microalbumin w/creat. ratio   Hyperlipidemia (Chronic)    Continue Crestor 20 mg daily.      Thyroid nodule (Chronic)    Check ultrasound.  Discussed we would need to have this nodule evaluated prior to considering starting Mounjaro or Ozempic.      Relevant Orders   US THYROID    Lipoma    Patient's forearm lesion appears to be a lipoma.  Discussed this was benign.  She will monitor for development of pain, enlargement, or development of firmness of the lipoma.  If those occur she will let us know.      Submandibular gland swelling    Check ultrasound.      Relevant Orders   US Soft Tissue Head/Neck (NON-THYROID)   Other Visit Diagnoses     Encounter for screening mammogram for malignant neoplasm of breast    -  Primary   Relevant Orders   MM 3D SCREEN BREAST BILATERAL        Health Maintenance: patient will call to schedule her mammogram.  Return in about 3 months (around 07/08/2022) for dm.   [name], MD [place]

## 2022-04-07 NOTE — Assessment & Plan Note (Addendum)
Check ultrasound.  Discussed we would need to have this nodule evaluated prior to considering starting Mounjaro or Ozempic.

## 2022-04-08 ENCOUNTER — Other Ambulatory Visit: Payer: Self-pay

## 2022-04-08 NOTE — Progress Notes (Signed)
Message sent to Martha Woodard to discuss eligibility for the medication bevespi.  Zong Mcquarrie,cma

## 2022-04-14 ENCOUNTER — Other Ambulatory Visit: Payer: Self-pay | Admitting: Pharmacy Technician

## 2022-04-14 NOTE — Patient Outreach (Signed)
Received message from provider's office stating patient in coverage gap and looking for assistance with obtaining Bevespi inhaler.  Spoke with patient.  Patient stated that she was already in enrolled in [place] and is receiving assistance with Bevespi and Pulmicort.   Jacquelynn Cree Patient Advocate Specialist [place]

## 2022-04-15 ENCOUNTER — Ambulatory Visit
Admission: RE | Admit: 2022-04-15 | Discharge: 2022-04-15 | Disposition: A | Discharge status: Home / Self Care | Payer: Medicare Other | Source: Ambulatory Visit | Attending: Family Medicine | Admitting: Family Medicine

## 2022-04-15 ENCOUNTER — Ambulatory Visit: Admission: RE | Admit: 2022-04-15 | Payer: Medicare Other | Source: Ambulatory Visit

## 2022-04-15 DIAGNOSIS — R600 Localized edema: Secondary | ICD-10-CM | POA: Insufficient documentation

## 2022-04-15 DIAGNOSIS — E041 Nontoxic single thyroid nodule: Secondary | ICD-10-CM | POA: Insufficient documentation

## 2022-04-15 DIAGNOSIS — E010 Iodine-deficiency related diffuse (endemic) goiter: Secondary | ICD-10-CM | POA: Diagnosis not present

## 2022-04-15 DIAGNOSIS — R590 Localized enlarged lymph nodes: Secondary | ICD-10-CM | POA: Diagnosis not present

## 2022-04-15 DIAGNOSIS — Z8719 Personal history of other diseases of the digestive system: Secondary | ICD-10-CM | POA: Diagnosis not present

## 2022-04-17 DIAGNOSIS — J449 Chronic obstructive pulmonary disease, unspecified: Secondary | ICD-10-CM | POA: Diagnosis not present

## 2022-04-18 ENCOUNTER — Telehealth: Payer: Self-pay | Admitting: *Deleted

## 2022-04-18 ENCOUNTER — Other Ambulatory Visit: Payer: Self-pay | Admitting: Family

## 2022-04-18 ENCOUNTER — Other Ambulatory Visit: Payer: Self-pay

## 2022-04-18 DIAGNOSIS — E041 Nontoxic single thyroid nodule: Secondary | ICD-10-CM

## 2022-04-18 NOTE — Telephone Encounter (Signed)
Attempted to call patient but phone only rang & unable to LM.

## 2022-04-18 NOTE — Telephone Encounter (Signed)
Left voicemail to return call for results below:  !) Your thyroid ultrasound revealed 1 thyroid nodule that is enlarged and appears moderately abnormal.  Radiology recommended biopsy of this.  I would like to refer you to ENT to have this biopsied.  I can place the referral once patient is agreeable.  2) Your head/Neck ultrasound revealed that your submandibular glands are unremarkable.  You have small submandibular lymph nodes (under the jaw) which are not abnormal.  No further work-up needed at this time.

## 2022-04-18 NOTE — Telephone Encounter (Signed)
Patient returned called transferred to [facility].

## 2022-04-18 NOTE — Telephone Encounter (Signed)
Pt called back and I read message to her and she stated she is agreeable to the referral

## 2022-04-30 ENCOUNTER — Ambulatory Visit
Admission: RE | Admit: 2022-04-30 | Discharge: 2022-04-30 | Disposition: A | Discharge status: Home / Self Care | Payer: Medicare Other | Source: Ambulatory Visit | Attending: Family Medicine | Admitting: Family Medicine

## 2022-04-30 DIAGNOSIS — Z1231 Encounter for screening mammogram for malignant neoplasm of breast: Secondary | ICD-10-CM | POA: Diagnosis not present

## 2022-05-13 ENCOUNTER — Telehealth: Payer: Medicare Other

## 2022-05-16 DIAGNOSIS — M5432 Sciatica, left side: Secondary | ICD-10-CM | POA: Diagnosis not present

## 2022-05-16 DIAGNOSIS — M9905 Segmental and somatic dysfunction of pelvic region: Secondary | ICD-10-CM | POA: Diagnosis not present

## 2022-05-16 DIAGNOSIS — M955 Acquired deformity of pelvis: Secondary | ICD-10-CM | POA: Diagnosis not present

## 2022-05-16 DIAGNOSIS — M9904 Segmental and somatic dysfunction of sacral region: Secondary | ICD-10-CM | POA: Diagnosis not present

## 2022-05-18 DIAGNOSIS — J449 Chronic obstructive pulmonary disease, unspecified: Secondary | ICD-10-CM | POA: Diagnosis not present

## 2022-05-19 DIAGNOSIS — M5432 Sciatica, left side: Secondary | ICD-10-CM | POA: Diagnosis not present

## 2022-05-19 DIAGNOSIS — M9905 Segmental and somatic dysfunction of pelvic region: Secondary | ICD-10-CM | POA: Diagnosis not present

## 2022-05-19 DIAGNOSIS — M9904 Segmental and somatic dysfunction of sacral region: Secondary | ICD-10-CM | POA: Diagnosis not present

## 2022-05-19 DIAGNOSIS — M955 Acquired deformity of pelvis: Secondary | ICD-10-CM | POA: Diagnosis not present

## 2022-05-20 DIAGNOSIS — M955 Acquired deformity of pelvis: Secondary | ICD-10-CM | POA: Diagnosis not present

## 2022-05-20 DIAGNOSIS — M9904 Segmental and somatic dysfunction of sacral region: Secondary | ICD-10-CM | POA: Diagnosis not present

## 2022-05-20 DIAGNOSIS — M9905 Segmental and somatic dysfunction of pelvic region: Secondary | ICD-10-CM | POA: Diagnosis not present

## 2022-05-20 DIAGNOSIS — M5432 Sciatica, left side: Secondary | ICD-10-CM | POA: Diagnosis not present

## 2022-05-23 DIAGNOSIS — M955 Acquired deformity of pelvis: Secondary | ICD-10-CM | POA: Diagnosis not present

## 2022-05-23 DIAGNOSIS — M9905 Segmental and somatic dysfunction of pelvic region: Secondary | ICD-10-CM | POA: Diagnosis not present

## 2022-05-23 DIAGNOSIS — M5432 Sciatica, left side: Secondary | ICD-10-CM | POA: Diagnosis not present

## 2022-05-23 DIAGNOSIS — E042 Nontoxic multinodular goiter: Secondary | ICD-10-CM | POA: Diagnosis not present

## 2022-05-23 DIAGNOSIS — M9904 Segmental and somatic dysfunction of sacral region: Secondary | ICD-10-CM | POA: Diagnosis not present

## 2022-05-23 DIAGNOSIS — E041 Nontoxic single thyroid nodule: Secondary | ICD-10-CM | POA: Diagnosis not present

## 2022-05-26 ENCOUNTER — Other Ambulatory Visit: Payer: Self-pay | Admitting: Unknown Physician Specialty

## 2022-05-26 DIAGNOSIS — M9905 Segmental and somatic dysfunction of pelvic region: Secondary | ICD-10-CM | POA: Diagnosis not present

## 2022-05-26 DIAGNOSIS — E041 Nontoxic single thyroid nodule: Secondary | ICD-10-CM

## 2022-05-26 DIAGNOSIS — M955 Acquired deformity of pelvis: Secondary | ICD-10-CM | POA: Diagnosis not present

## 2022-05-26 DIAGNOSIS — M9904 Segmental and somatic dysfunction of sacral region: Secondary | ICD-10-CM | POA: Diagnosis not present

## 2022-05-26 DIAGNOSIS — M5432 Sciatica, left side: Secondary | ICD-10-CM | POA: Diagnosis not present

## 2022-05-28 DIAGNOSIS — M9905 Segmental and somatic dysfunction of pelvic region: Secondary | ICD-10-CM | POA: Diagnosis not present

## 2022-05-28 DIAGNOSIS — M5432 Sciatica, left side: Secondary | ICD-10-CM | POA: Diagnosis not present

## 2022-05-28 DIAGNOSIS — M955 Acquired deformity of pelvis: Secondary | ICD-10-CM | POA: Diagnosis not present

## 2022-05-28 DIAGNOSIS — M9904 Segmental and somatic dysfunction of sacral region: Secondary | ICD-10-CM | POA: Diagnosis not present

## 2022-05-30 DIAGNOSIS — M5432 Sciatica, left side: Secondary | ICD-10-CM | POA: Diagnosis not present

## 2022-05-30 DIAGNOSIS — M955 Acquired deformity of pelvis: Secondary | ICD-10-CM | POA: Diagnosis not present

## 2022-05-30 DIAGNOSIS — M9904 Segmental and somatic dysfunction of sacral region: Secondary | ICD-10-CM | POA: Diagnosis not present

## 2022-05-30 DIAGNOSIS — M9905 Segmental and somatic dysfunction of pelvic region: Secondary | ICD-10-CM | POA: Diagnosis not present

## 2022-06-03 DIAGNOSIS — M9905 Segmental and somatic dysfunction of pelvic region: Secondary | ICD-10-CM | POA: Diagnosis not present

## 2022-06-03 DIAGNOSIS — M5432 Sciatica, left side: Secondary | ICD-10-CM | POA: Diagnosis not present

## 2022-06-03 DIAGNOSIS — M9904 Segmental and somatic dysfunction of sacral region: Secondary | ICD-10-CM | POA: Diagnosis not present

## 2022-06-03 DIAGNOSIS — M955 Acquired deformity of pelvis: Secondary | ICD-10-CM | POA: Diagnosis not present

## 2022-06-04 DIAGNOSIS — M9905 Segmental and somatic dysfunction of pelvic region: Secondary | ICD-10-CM | POA: Diagnosis not present

## 2022-06-04 DIAGNOSIS — M5432 Sciatica, left side: Secondary | ICD-10-CM | POA: Diagnosis not present

## 2022-06-04 DIAGNOSIS — M9904 Segmental and somatic dysfunction of sacral region: Secondary | ICD-10-CM | POA: Diagnosis not present

## 2022-06-04 DIAGNOSIS — M955 Acquired deformity of pelvis: Secondary | ICD-10-CM | POA: Diagnosis not present

## 2022-06-06 ENCOUNTER — Ambulatory Visit
Admission: RE | Admit: 2022-06-06 | Discharge: 2022-06-06 | Disposition: A | Discharge status: Home / Self Care | Payer: Medicare Other | Source: Ambulatory Visit | Attending: Unknown Physician Specialty | Admitting: Unknown Physician Specialty

## 2022-06-06 DIAGNOSIS — E042 Nontoxic multinodular goiter: Secondary | ICD-10-CM | POA: Diagnosis not present

## 2022-06-06 DIAGNOSIS — E041 Nontoxic single thyroid nodule: Secondary | ICD-10-CM

## 2022-06-09 LAB — CYTOLOGY - NON PAP

## 2022-06-10 DIAGNOSIS — M9904 Segmental and somatic dysfunction of sacral region: Secondary | ICD-10-CM | POA: Diagnosis not present

## 2022-06-10 DIAGNOSIS — M955 Acquired deformity of pelvis: Secondary | ICD-10-CM | POA: Diagnosis not present

## 2022-06-10 DIAGNOSIS — M9905 Segmental and somatic dysfunction of pelvic region: Secondary | ICD-10-CM | POA: Diagnosis not present

## 2022-06-10 DIAGNOSIS — M5432 Sciatica, left side: Secondary | ICD-10-CM | POA: Diagnosis not present

## 2022-06-13 DIAGNOSIS — M9905 Segmental and somatic dysfunction of pelvic region: Secondary | ICD-10-CM | POA: Diagnosis not present

## 2022-06-13 DIAGNOSIS — M9904 Segmental and somatic dysfunction of sacral region: Secondary | ICD-10-CM | POA: Diagnosis not present

## 2022-06-13 DIAGNOSIS — M5432 Sciatica, left side: Secondary | ICD-10-CM | POA: Diagnosis not present

## 2022-06-13 DIAGNOSIS — M955 Acquired deformity of pelvis: Secondary | ICD-10-CM | POA: Diagnosis not present

## 2022-06-16 ENCOUNTER — Ambulatory Visit: Payer: Medicare Other | Admitting: Pulmonary Disease

## 2022-06-16 ENCOUNTER — Encounter: Payer: Self-pay | Admitting: Pulmonary Disease

## 2022-06-16 VITALS — BP 126/60 | HR 78 | Temp 98.1°F | Ht 66.0 in | Wt 256.6 lb

## 2022-06-16 DIAGNOSIS — J4489 Other specified chronic obstructive pulmonary disease: Secondary | ICD-10-CM

## 2022-06-16 DIAGNOSIS — J449 Chronic obstructive pulmonary disease, unspecified: Secondary | ICD-10-CM

## 2022-06-16 DIAGNOSIS — J9612 Chronic respiratory failure with hypercapnia: Secondary | ICD-10-CM

## 2022-06-16 DIAGNOSIS — J9611 Chronic respiratory failure with hypoxia: Secondary | ICD-10-CM | POA: Diagnosis not present

## 2022-06-16 DIAGNOSIS — Z87891 Personal history of nicotine dependence: Secondary | ICD-10-CM

## 2022-06-16 DIAGNOSIS — Z6839 Body mass index (BMI) 39.0-39.9, adult: Secondary | ICD-10-CM

## 2022-06-16 DIAGNOSIS — E662 Morbid (severe) obesity with alveolar hypoventilation: Secondary | ICD-10-CM | POA: Diagnosis not present

## 2022-06-16 NOTE — Patient Instructions (Signed)
We will see him in follow-up in 3 months time call sooner should any problems arise.  Does know when you are on your last Pulmicort inhaler so that we can send the prescription for the new one.  Then 1 will be call [facility].

## 2022-06-16 NOTE — Progress Notes (Signed)
Subjective:    Patient ID: Martha Woodard, female    DOB: 27-Oct-1965, 56 y.o.   MRN: [id number] Patient Care Team: Martha Haven, MD as PCP - General (Family Medicine) Martha Woodard, Martha Gleason, MD (General Surgery) Martha Merritts, MD as Consulting Physician (Cardiology)  Chief Complaint  Patient presents with   Follow-up    Increased cough over the past month- prod with yellow sputum. She is having some HA and sinus pressure as well. She has occ wheezing.    PROBLEMS: Chronic hypoxic/hypercarbic respiratory failure Former smoker Moderate/severe COPD [facility] CHF Multiple pulmonary nodules - stable > 2 yrs and deemed benign   DATA: 09/28/2015 2D echo: EF 60 to 65%, grade 1 DD, mild left atrial dilation.  Right-sided pressures normal. 02/28/2020 PFTs:FEV1 1.42L or 49%, FVC or 2.24L 60%, FEV1/FVC 63%, no bronchodilator response.  RV is 122% indicating air trapping.  Diffusion capacity normal.  Consistent with moderate to severe COPD. 04/05/2020 chest x-ray: No active disease.  Consistent with COPD, left base scarring/atelectasis. 01/21/2022 LDCT: Mild to moderate centrilobular emphysema.  Multiple bilateral pulmonary nodules of which the larger were present on 01/17/2016 exam.  Some new nodules from 2017 exam.  Coronary artery calcifications.  Lung RADS 3 short-term follow-up 6 months ordered   INTERVAL HISTORY: Last seen by me on 11 Feb 2022. No major exacerbations since then.  Concern today is that her Pulmicort Flexhaler will be discontinued by [employer].  He has enough medication to last through December.  HPI Martha Woodard is a 56 year old former smoker who presents today for follow-up asthma/COPD overlap syndrome. She is well controlled on Bevespi and Pulmicort Flexhaler.  She did not tolerate the Breztri combination due to flares of of thrush and has to discontinue the ICS component.  ICS component controls her cough and asthma symptoms.  We had to prescribe the ICS component separately as  she can then adjust this according to her asthma symptoms. She has multiple lung nodules that were previously noted as far back as 2017 however there are some new ones from that last CT performed in 2017.  These will need to be followed up expectantly.  She has follow-up CT scan scheduled for October.  She had biopsy of some thyroid nodules on 8 September, these were benign follicular nodules.   She is compliant with oxygen therapy for chronic hypoxic/hypercarbic respiratory failure due to COPD and obesity with obesity hypoventilation.  She has not had any fevers, chills or sweats.  No purulent sputum production.  She has had some increased nasal congestion and drainage that she attributes to ragweed allergy.  She feels that her dyspnea is well compensated.  Overall she feels well and looks well.   Review of Systems A 10 point review of systems was performed and it is as noted above otherwise negative.  Patient Active Problem List   Diagnosis Date Noted   Submandibular gland swelling 04/07/2022   Lipoma 04/07/2022   Sinusitis 01/06/2022   Hot flashes 05/24/2021   Foot cramps 02/20/2021   De Quervain's tenosynovitis, left 11/23/2020   Papilloma of oral cavity 12/13/2018   Seborrheic dermatitis 10/02/2018   Light headedness 06/17/2018   Foot pain 06/17/2018   Allergic rhinitis 11/09/2017   Venous insufficiency 08/07/2017   Chronic respiratory failure ([place]) 11/13/2016   Skin cyst 08/19/2016   Thyroid nodule 11/28/2015   COPD (chronic obstructive pulmonary disease) with emphysema ([place]) 11/13/2015   Chronic diastolic heart failure ([place]) 11/13/2015   Morbid obesity with BMI of  45.0-49.9, adult ([place]) 10/01/2015   Obesity hypoventilation syndrome ([place]) 10/01/2015   Eczema 10/01/2015   Essential hypertension 10/01/2015   Palpitations 06/26/2014   DM type 2 (diabetes mellitus, type 2) ([place]) 06/26/2014   Hyperlipidemia 06/26/2014   Lung nodule seen on imaging study 02/06/2014   Social  History   Tobacco Use   Smoking status: Former    Packs/day: 3.00    Years: 34.00    Total pack years: 102.00    Types: Cigarettes    Quit date: 07/10/2013    Years since quitting: 8.9   Smokeless tobacco: Never  Substance Use Topics   Alcohol use: No    Alcohol/week: 0.0 standard drinks of alcohol   Allergies  Allergen Reactions   Augmentin [Amoxicillin-Pot Clavulanate] Itching    Hives Can take PCN and Amoxicillin alone    Current Meds  Medication Sig   ACCU-CHEK AVIVA PLUS test strip TEST UP TO 4 TIMES DAILY AS DIRECTED   Accu-Chek Softclix Lancets lancets TEST UP TO 4 TIMES DAILY   acetaminophen (TYLENOL) 650 MG CR tablet Take 650 mg by mouth every 8 (eight) hours as needed for pain.   albuterol (PROVENTIL) (2.5 MG/3ML) 0.083% nebulizer solution Take 3 mLs (2.5 mg total) by nebulization every 6 (six) hours as needed for wheezing or shortness of breath.   aspirin EC 81 MG tablet Take 1 tablet (81 mg total) by mouth daily.   blood glucose meter kit and supplies Dispense based on patient and insurance preference. Use up to four times daily as directed. (FOR ICD-9 250.00, 250.01).   budesonide (PULMICORT FLEXHALER) 180 MCG/ACT inhaler Inhale 1-2 puffs into the lungs 2 (two) times daily.   calcium-vitamin D (OSCAL WITH D) 500-200 MG-UNIT tablet Take 1 tablet by mouth 2 (two) times daily.   cetirizine (ZYRTEC) 10 MG tablet Take 10 mg by mouth daily.   fluticasone (FLONASE) 50 MCG/ACT nasal spray USE 1 OR 2 SPRAYS IN EACH NOSTRIL ONCE DAILY.   furosemide (LASIX) 20 MG tablet Take 1 tablet (20 mg total) by mouth daily.   gabapentin (NEURONTIN) 100 MG capsule TAKE 1 CAPSULE BY MOUTH 3 TIMES  DAILY   Glycopyrrolate-Formoterol (BEVESPI AEROSPHERE) 9-4.8 MCG/ACT AERO Inhale 2 puffs into the lungs 2 (two) times daily.   hydrochlorothiazide (MICROZIDE) 12.5 MG capsule TAKE 1 CAPSULE BY MOUTH DAILY   ketoconazole (NIZORAL) 2 % cream Apply 1 application topically daily.   ketoconazole  (NIZORAL) 2 % shampoo Apply 1 application topically 2 (two) times a week.   losartan (COZAAR) 25 MG tablet TAKE 1 TABLET BY MOUTH TWICE  DAILY   magnesium oxide (MAG-OX) 400 MG tablet Take 400 mg by mouth daily.   meloxicam (MOBIC) 15 MG tablet Take 1 tablet every day by oral route.   metFORMIN (GLUCOPHAGE) 500 MG tablet TAKE 2 TABLETS BY MOUTH  TWICE DAILY WITH MEALS   metoprolol succinate (TOPROL-XL) 25 MG 24 hr tablet TAKE 1 TABLET BY MOUTH ONCE  DAILY   montelukast (SINGULAIR) 10 MG tablet TAKE 1 TABLET BY MOUTH AT  BEDTIME   Multiple Vitamin (MULTIVITAMIN) tablet Take 1 tablet by mouth daily.   nystatin (MYCOSTATIN) 100000 UNIT/ML suspension Take 5 mLs (500,000 Units total) by mouth 4 (four) times daily. X 7-10 days   nystatin (MYCOSTATIN/NYSTOP) powder Apply 1 application topically 2 (two) times daily.   nystatin cream (MYCOSTATIN) Apply 1 application topically 2 (two) times daily.   ondansetron (ZOFRAN ODT) 4 MG disintegrating tablet Take 1 tablet (4 mg total) by  mouth every 8 (eight) hours as needed for nausea or vomiting.   OXYGEN Inhale 2 L into the lungs daily. 2 liters at night, 1 liter when doing activities, and no oxygen when at home during the day.   potassium chloride (KLOR-CON) 10 MEQ tablet TAKE 1 TABLET BY MOUTH  TWICE DAILY   PROVENTIL HFA 108 (90 Base) MCG/ACT inhaler Inhale 2 puffs into the lungs every 6 (six) hours as needed for wheezing or shortness of breath. 18 gram inhaler   rosuvastatin (CRESTOR) 20 MG tablet TAKE 1 TABLET BY MOUTH DAILY   sodium chloride (OCEAN) 0.65 % SOLN nasal spray Place 2 sprays into both nostrils as needed for congestion.   vitamin C (ASCORBIC ACID) 500 MG tablet Take 1,000 mg by mouth daily.    Vitamin E 180 MG CAPS Take 1 capsule by mouth daily.   Immunization History  Administered Date(s) Administered   Influenza,inj,Quad PF,6+ Mos 07/02/2015, 08/19/2016, 08/07/2017, 06/16/2018, 06/29/2019, 07/31/2020, 07/05/2021   Moderna SARS-COV2  Booster Vaccination 11/05/2020   Moderna Sars-Covid-2 Vaccination 03/08/2020, 04/05/2020   Pneumococcal Polysaccharide-23 09/26/2015   Pneumococcal-Unspecified 11/23/2020      Objective:   Physical Exam BP 126/60 (BP Location: Left Arm, Cuff Size: Large)   Pulse 78   Temp 98.1 F (36.7 C) (Oral)   Ht '5\' 6"'$  (1.676 m)   Wt 256 lb 9.6 oz (116.4 kg)   LMP 04/10/2014   SpO2 94% Comment: 1lpm cont o2  BMI 41.42 kg/m   SpO2: 94 % (1lpm cont o2) O2 Device: Nasal cannula O2 Flow Rate (L/min): 1 L/min O2 Type: Continuous O2  GENERAL: Obese woman, no acute distress, comfortable with nasal cannula O2.  Fully ambulatory. HEAD: Normocephalic, atraumatic. EYES: Pupils equal, round, reactive to light.  No scleral icterus. MOUTH: Dentition intact, oral mucosa moist, no thrush. NECK: Supple. No thyromegaly. Trachea midline. No JVD.  No adenopathy. PULMONARY: Mildly diminished breath sounds.  No adventitious sounds, lungs sound clear.   CARDIOVASCULAR: S1 and S2. Regular rate and rhythm.  No rubs, murmurs or gallops heard. ABDOMEN: Obese, otherwise benign. MUSCULOSKELETAL: No joint deformity, no clubbing, no edema. NEUROLOGIC: No focal deficit, no gait disturbance, speech is fluent. SKIN: Intact,warm,dry. PSYCH: Mood and behavior normal     Assessment & Plan:     ICD-10-CM   1. 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 Chronic respiratory failure with hypoxia and hypercapnia (HCC)  J96.11    J96.12    Compliant with oxygen Continue oxygen at 1 L/min 24/7    3. Former very heavy cigarette smoker (more than 40 per day)  Z87.891    No evidence of relapse Enrolled in lung cancer screening    4. Class 2 obesity with alveolar hypoventilation, serious comorbidity, and body mass index (BMI) of 39.0 to 39.9 in adult [facility])  E66.2    Z68.39    This issue adds complexity to her  management She would benefit from weight loss     Overall the patient appears well compensated.  We will see her in follow-up in 3 months time she is to contact us prior to that time should any new difficulties arise.  Renold Don, MD Advanced Bronchoscopy PCCM [facility]    *This note was dictated using voice recognition software/Dragon.  Despite best efforts to proofread, errors can occur which can change the meaning. Any transcriptional errors that result from  this process are unintentional and may not be fully corrected at the time of dictation.

## 2022-06-18 DIAGNOSIS — J449 Chronic obstructive pulmonary disease, unspecified: Secondary | ICD-10-CM | POA: Diagnosis not present

## 2022-06-18 DIAGNOSIS — M5432 Sciatica, left side: Secondary | ICD-10-CM | POA: Diagnosis not present

## 2022-06-18 DIAGNOSIS — M9905 Segmental and somatic dysfunction of pelvic region: Secondary | ICD-10-CM | POA: Diagnosis not present

## 2022-06-18 DIAGNOSIS — M955 Acquired deformity of pelvis: Secondary | ICD-10-CM | POA: Diagnosis not present

## 2022-06-18 DIAGNOSIS — M9904 Segmental and somatic dysfunction of sacral region: Secondary | ICD-10-CM | POA: Diagnosis not present

## 2022-06-20 DIAGNOSIS — M9904 Segmental and somatic dysfunction of sacral region: Secondary | ICD-10-CM | POA: Diagnosis not present

## 2022-06-20 DIAGNOSIS — M9905 Segmental and somatic dysfunction of pelvic region: Secondary | ICD-10-CM | POA: Diagnosis not present

## 2022-06-20 DIAGNOSIS — M5432 Sciatica, left side: Secondary | ICD-10-CM | POA: Diagnosis not present

## 2022-06-20 DIAGNOSIS — M955 Acquired deformity of pelvis: Secondary | ICD-10-CM | POA: Diagnosis not present

## 2022-06-23 DIAGNOSIS — M9904 Segmental and somatic dysfunction of sacral region: Secondary | ICD-10-CM | POA: Diagnosis not present

## 2022-06-23 DIAGNOSIS — M5432 Sciatica, left side: Secondary | ICD-10-CM | POA: Diagnosis not present

## 2022-06-23 DIAGNOSIS — M9905 Segmental and somatic dysfunction of pelvic region: Secondary | ICD-10-CM | POA: Diagnosis not present

## 2022-06-23 DIAGNOSIS — M955 Acquired deformity of pelvis: Secondary | ICD-10-CM | POA: Diagnosis not present

## 2022-06-25 DIAGNOSIS — M5432 Sciatica, left side: Secondary | ICD-10-CM | POA: Diagnosis not present

## 2022-06-25 DIAGNOSIS — M955 Acquired deformity of pelvis: Secondary | ICD-10-CM | POA: Diagnosis not present

## 2022-06-25 DIAGNOSIS — M9904 Segmental and somatic dysfunction of sacral region: Secondary | ICD-10-CM | POA: Diagnosis not present

## 2022-06-25 DIAGNOSIS — M9905 Segmental and somatic dysfunction of pelvic region: Secondary | ICD-10-CM | POA: Diagnosis not present

## 2022-06-30 DIAGNOSIS — M9905 Segmental and somatic dysfunction of pelvic region: Secondary | ICD-10-CM | POA: Diagnosis not present

## 2022-06-30 DIAGNOSIS — M5432 Sciatica, left side: Secondary | ICD-10-CM | POA: Diagnosis not present

## 2022-06-30 DIAGNOSIS — M955 Acquired deformity of pelvis: Secondary | ICD-10-CM | POA: Diagnosis not present

## 2022-06-30 DIAGNOSIS — M9904 Segmental and somatic dysfunction of sacral region: Secondary | ICD-10-CM | POA: Diagnosis not present

## 2022-07-07 DIAGNOSIS — M9904 Segmental and somatic dysfunction of sacral region: Secondary | ICD-10-CM | POA: Diagnosis not present

## 2022-07-07 DIAGNOSIS — M955 Acquired deformity of pelvis: Secondary | ICD-10-CM | POA: Diagnosis not present

## 2022-07-07 DIAGNOSIS — M5432 Sciatica, left side: Secondary | ICD-10-CM | POA: Diagnosis not present

## 2022-07-07 DIAGNOSIS — M9905 Segmental and somatic dysfunction of pelvic region: Secondary | ICD-10-CM | POA: Diagnosis not present

## 2022-07-08 ENCOUNTER — Ambulatory Visit: Payer: Medicare Other | Admitting: Family Medicine

## 2022-07-14 DIAGNOSIS — M955 Acquired deformity of pelvis: Secondary | ICD-10-CM | POA: Diagnosis not present

## 2022-07-14 DIAGNOSIS — M9904 Segmental and somatic dysfunction of sacral region: Secondary | ICD-10-CM | POA: Diagnosis not present

## 2022-07-14 DIAGNOSIS — M9905 Segmental and somatic dysfunction of pelvic region: Secondary | ICD-10-CM | POA: Diagnosis not present

## 2022-07-14 DIAGNOSIS — M5432 Sciatica, left side: Secondary | ICD-10-CM | POA: Diagnosis not present

## 2022-07-16 ENCOUNTER — Ambulatory Visit
Admission: RE | Admit: 2022-07-16 | Discharge: 2022-07-16 | Disposition: A | Discharge status: Home / Self Care | Payer: Medicare Other | Source: Ambulatory Visit | Attending: Acute Care | Admitting: Acute Care

## 2022-07-16 DIAGNOSIS — Z122 Encounter for screening for malignant neoplasm of respiratory organs: Secondary | ICD-10-CM | POA: Diagnosis not present

## 2022-07-16 DIAGNOSIS — Z87891 Personal history of nicotine dependence: Secondary | ICD-10-CM | POA: Diagnosis not present

## 2022-07-16 DIAGNOSIS — R911 Solitary pulmonary nodule: Secondary | ICD-10-CM | POA: Diagnosis not present

## 2022-07-16 DIAGNOSIS — J439 Emphysema, unspecified: Secondary | ICD-10-CM | POA: Diagnosis not present

## 2022-07-18 DIAGNOSIS — J449 Chronic obstructive pulmonary disease, unspecified: Secondary | ICD-10-CM | POA: Diagnosis not present

## 2022-07-21 DIAGNOSIS — M9905 Segmental and somatic dysfunction of pelvic region: Secondary | ICD-10-CM | POA: Diagnosis not present

## 2022-07-21 DIAGNOSIS — M955 Acquired deformity of pelvis: Secondary | ICD-10-CM | POA: Diagnosis not present

## 2022-07-21 DIAGNOSIS — M5432 Sciatica, left side: Secondary | ICD-10-CM | POA: Diagnosis not present

## 2022-07-21 DIAGNOSIS — M9904 Segmental and somatic dysfunction of sacral region: Secondary | ICD-10-CM | POA: Diagnosis not present

## 2022-07-23 ENCOUNTER — Telehealth: Payer: Self-pay | Admitting: Acute Care

## 2022-07-23 ENCOUNTER — Other Ambulatory Visit: Payer: Self-pay

## 2022-07-23 ENCOUNTER — Telehealth: Payer: Self-pay | Admitting: Pulmonary Disease

## 2022-07-23 DIAGNOSIS — Z87891 Personal history of nicotine dependence: Secondary | ICD-10-CM

## 2022-07-23 DIAGNOSIS — R911 Solitary pulmonary nodule: Secondary | ICD-10-CM

## 2022-07-23 NOTE — Telephone Encounter (Signed)
Noted.  Will close encounter.  

## 2022-07-23 NOTE — Telephone Encounter (Signed)
I have called the patient with the results of her low dose CT Chest. I explained that her scan was read as a Lung  RADS 3, nodules that are probably benign findings, short term follow up suggested: includes nodules with a low likelihood of becoming a clinically active cancer. Radiology recommends a 6 month repeat LDCT follow up. There is a Solitary new 4.8 mm solid right upper lobe pulmonary nodule.Plan is for a 6 month follow up. Patient is in agreement with 6 month follow up.  There was notation of  Three-vessel coronary atherosclerosis.,Diffuse hepatic steatosis. Cholelithiasis. Left colonic diverticulosis. And Aortic Atherosclerosis (ICD10-I70.0) and Emphysema (ICD10-J43.9). The patient is followed by cardiology, she was already aware of the hepatic steatosis, she has had a recent colonoscopy, and she will talk with her PCP regarding the findings of Cholelithiasis.  This scan was itself a LR 3 follow up for a finding of multiple pulmonary nodules 12/2021. There was no comment on the previously noted nodules, so most likely they are considered stable.  Plan is for a 6 month follow up LDCT in 6 months to re-evaluate the Solitary new 4.8 mm solid right upper lobe pulmonary nodule.  Langley Gauss, please place order and fax results to PCP. I have included both PCP and Cards on this message.   Thanks so much

## 2022-07-23 NOTE — Telephone Encounter (Signed)
Noted  

## 2022-07-23 NOTE — Telephone Encounter (Signed)
Spoke to patient. She is requesting low dose CT results.   Sarah, please advise. thanks

## 2022-07-23 NOTE — Telephone Encounter (Signed)
Results with plan faxed to PCP and new order placed for 6 month follow up CT

## 2022-07-28 DIAGNOSIS — M9905 Segmental and somatic dysfunction of pelvic region: Secondary | ICD-10-CM | POA: Diagnosis not present

## 2022-07-28 DIAGNOSIS — M9904 Segmental and somatic dysfunction of sacral region: Secondary | ICD-10-CM | POA: Diagnosis not present

## 2022-07-28 DIAGNOSIS — M955 Acquired deformity of pelvis: Secondary | ICD-10-CM | POA: Diagnosis not present

## 2022-07-28 DIAGNOSIS — M5432 Sciatica, left side: Secondary | ICD-10-CM | POA: Diagnosis not present

## 2022-08-04 ENCOUNTER — Telehealth: Payer: Self-pay | Admitting: Family Medicine

## 2022-08-04 ENCOUNTER — Other Ambulatory Visit: Payer: Self-pay

## 2022-08-04 DIAGNOSIS — J309 Allergic rhinitis, unspecified: Secondary | ICD-10-CM

## 2022-08-04 MED ORDER — FLUTICASONE PROPIONATE 50 MCG/ACT NA SUSP: NASAL | 12 refills | Status: DC

## 2022-08-04 NOTE — Telephone Encounter (Signed)
Pt need a refill on FLONASE sent to [facility]

## 2022-08-04 NOTE — Telephone Encounter (Signed)
Medication sent to [facility].  Montel Vanderhoof,cma

## 2022-08-06 ENCOUNTER — Telehealth: Payer: Self-pay

## 2022-08-06 NOTE — Progress Notes (Signed)
[facility])  [place]   08/06/2022  Martha Woodard 1966-09-15 [id number]  2024 Medication Assistance Renewal Application Summary:  Patient was outreached by [place] Team regarding medication assistance renewal for 2024 fo r[employer]eves[employer]. Patient was contacted and able leave a message. F/u in 3-5 business days with patient.    Thank you for allowing pharmacy to be a part of this patient's care.   Kristeen Miss, PharmD Clinical Pharmacist [place] Cell: [phone]

## 2022-08-11 ENCOUNTER — Telehealth: Payer: Self-pay | Admitting: Family Medicine

## 2022-08-11 ENCOUNTER — Telehealth: Payer: Self-pay

## 2022-08-11 DIAGNOSIS — M9905 Segmental and somatic dysfunction of pelvic region: Secondary | ICD-10-CM | POA: Diagnosis not present

## 2022-08-11 DIAGNOSIS — M9904 Segmental and somatic dysfunction of sacral region: Secondary | ICD-10-CM | POA: Diagnosis not present

## 2022-08-11 DIAGNOSIS — M5432 Sciatica, left side: Secondary | ICD-10-CM | POA: Diagnosis not present

## 2022-08-11 DIAGNOSIS — M955 Acquired deformity of pelvis: Secondary | ICD-10-CM | POA: Diagnosis not present

## 2022-08-11 NOTE — Telephone Encounter (Signed)
Copied from [place] [phone]. Topic: Medicare AWV >> Aug 11, 2022 10:07 AM Devoria Glassing wrote: Reason for CRM: Left message for patient to schedule Annual Wellness Visit.  Please schedule with Nurse Health Advisor Denisa O'Brien-Blaney, LPN at [facility]. This appt can be telephone or office visit.  Please call [phone] ask for [facility]

## 2022-08-11 NOTE — Progress Notes (Signed)
[facility])  [place]   08/11/2022  ANTASIA HAIDER [birth year] [id number]  2024 Medication Assistance Renewal Application Summary:  Patient was outreached by [place] Team regarding medication assistance renewal for 2024 for [employer]. Patient was contacted and able leave a message. F/u in 3-5 business days with patient.    Thank you for allowing pharmacy to be a part of this patient's care.   Kristeen Miss, PharmD Clinical Pharmacist [place] Cell: [phone]

## 2022-08-14 ENCOUNTER — Ambulatory Visit (INDEPENDENT_AMBULATORY_CARE_PROVIDER_SITE_OTHER): Payer: Medicare Other

## 2022-08-14 VITALS — Ht 66.0 in | Wt 253.0 lb

## 2022-08-14 DIAGNOSIS — Z0000 Encounter for general adult medical examination without abnormal findings: Secondary | ICD-10-CM

## 2022-08-14 NOTE — Progress Notes (Signed)
Subjective:   Martha Woodard is a 56 y.o. female who presents for Medicare Annual (Subsequent) preventive examination.  Review of Systems    No ROS.  Medicare Wellness Virtual Visit.  Visual/audio telehealth visit, UTA vital signs.   See social history for additional risk factors.   Cardiac Risk Factors include: advanced age (>3mn, >>64women);hypertension;diabetes mellitus     Objective:    Today's Vitals   08/14/22 0959  Weight: 253 lb (114.8 kg)  Height: _0  (1.676 m)   Body mass index is 40.84 kg/m.     08/14/2022   10:07 AM 08/13/2021    8:36 AM 08/10/2020    8:59 AM 08/10/2019    8:42 AM 12/03/2018    2:58 PM 12/24/2017    9:22 AM 09/25/2015    1:50 PM  Advanced Directives  Does Patient Have a Medical Advance Directive? _1  No No  Would patient like information on creating a medical advance directive? No - Patient declined No - Patient declined No - Patient declined No - Patient declined No - Patient declined Yes (MAU/Ambulatory/Procedural Areas - Information given) Yes - Spiritual care consult ordered    Current Medications (verified) Outpatient Encounter Medications as of 08/14/2022  Medication Sig   ACCU-CHEK AVIVA PLUS test strip TEST UP TO 4 TIMES DAILY AS DIRECTED   Accu-Chek Softclix Lancets lancets TEST UP TO 4 TIMES DAILY   acetaminophen (TYLENOL) 650 MG CR tablet Take 650 mg by mouth every 8 (eight) hours as needed for pain.   albuterol (PROVENTIL) (2.5 MG/3ML) 0.083% nebulizer solution Take 3 mLs (2.5 mg total) by nebulization every 6 (six) hours as needed for wheezing or shortness of breath.   aspirin EC 81 MG tablet Take 1 tablet (81 mg total) by mouth daily.   blood glucose meter kit and supplies Dispense based on patient and insurance preference. Use up to four times daily as directed. (FOR ICD-9 250.00, 250.01).   budesonide (PULMICORT FLEXHALER) 180 MCG/ACT inhaler Inhale 1-2 puffs into the lungs 2 (two) times daily.   calcium-vitamin  D (OSCAL WITH D) 500-200 MG-UNIT tablet Take 1 tablet by mouth 2 (two) times daily.   cetirizine (ZYRTEC) 10 MG tablet Take 10 mg by mouth daily.   fluticasone (FLONASE) 50 MCG/ACT nasal spray USE 1 OR 2 SPRAYS IN EACH NOSTRIL ONCE DAILY.   furosemide (LASIX) 20 MG tablet Take 1 tablet (20 mg total) by mouth daily.   gabapentin (NEURONTIN) 100 MG capsule TAKE 1 CAPSULE BY MOUTH 3 TIMES  DAILY   Glycopyrrolate-Formoterol (BEVESPI AEROSPHERE) 9-4.8 MCG/ACT AERO Inhale 2 puffs into the lungs 2 (two) times daily.   hydrochlorothiazide (MICROZIDE) 12.5 MG capsule TAKE 1 CAPSULE BY MOUTH DAILY   ketoconazole (NIZORAL) 2 % cream Apply 1 application topically daily.   ketoconazole (NIZORAL) 2 % shampoo Apply 1 application topically 2 (two) times a week.   losartan (COZAAR) 25 MG tablet TAKE 1 TABLET BY MOUTH TWICE  DAILY   magnesium oxide (MAG-OX) 400 MG tablet Take 400 mg by mouth daily.   meloxicam (MOBIC) 15 MG tablet Take 1 tablet every day by oral route.   metFORMIN (GLUCOPHAGE) 500 MG tablet TAKE 2 TABLETS BY MOUTH  TWICE DAILY WITH MEALS   metoprolol succinate (TOPROL-XL) 25 MG 24 hr tablet TAKE 1 TABLET BY MOUTH ONCE  DAILY   montelukast (SINGULAIR) 10 MG tablet TAKE 1 TABLET BY MOUTH AT  BEDTIME   Multiple Vitamin (MULTIVITAMIN) tablet Take 1 tablet  by mouth daily.   nystatin (MYCOSTATIN) 100000 UNIT/ML suspension Take 5 mLs (500,000 Units total) by mouth 4 (four) times daily. X 7-10 days   nystatin (MYCOSTATIN/NYSTOP) powder Apply 1 application topically 2 (two) times daily.   nystatin cream (MYCOSTATIN) Apply 1 application topically 2 (two) times daily.   ondansetron (ZOFRAN ODT) 4 MG disintegrating tablet Take 1 tablet (4 mg total) by mouth every 8 (eight) hours as needed for nausea or vomiting.   OXYGEN Inhale 2 L into the lungs daily. 2 liters at night, 1 liter when doing activities, and no oxygen when at home during the day.   potassium chloride (KLOR-CON) 10 MEQ tablet TAKE 1 TABLET BY  MOUTH  TWICE DAILY   PROVENTIL HFA 108 (90 Base) MCG/ACT inhaler Inhale 2 puffs into the lungs every 6 (six) hours as needed for wheezing or shortness of breath. 18 gram inhaler   rosuvastatin (CRESTOR) 20 MG tablet TAKE 1 TABLET BY MOUTH DAILY   sodium chloride (OCEAN) 0.65 % SOLN nasal spray Place 2 sprays into both nostrils as needed for congestion.   vitamin C (ASCORBIC ACID) 500 MG tablet Take 1,000 mg by mouth daily.    Vitamin E 180 MG CAPS Take 1 capsule by mouth daily.   No facility-administered encounter medications on file as of 08/14/2022.    Allergies (verified) Augmentin [amoxicillin-pot clavulanate]   History: Past Medical History:  Diagnosis Date   CHF (congestive heart failure) ([place])    Colon polyp 01-01-2016   COPD (chronic obstructive pulmonary disease) (HCC)    Diabetes mellitus without complication (HCC)    Diastolic heart failure (HCC)    HLD (hyperlipidemia)    Hypertension    Morbid obesity with BMI of 45.0-49.9, adult ([place])    Multiple lung nodules on CT    Shortness of breath dyspnea    Past Surgical History:  Procedure Laterality Date   BIOPSY THYROID Right 02-06-14   NEGATIVE FOR MALIGNANT CELLS proteinaceous material and macrophages.   BREAST BIOPSY Left    neg- core   COLONOSCOPY WITH PROPOFOL N/A 01/01/2016   Procedure: COLONOSCOPY WITH PROPOFOL;  Surgeon: Lucilla Lame, MD;  Location: [facility] ENDOSCOPY;  Service: Endoscopy;  Laterality: N/A;   thryoid fna Right April 2015   Proteinaceous material and macrophages.   WISDOM TOOTH EXTRACTION     Family History  Problem Relation Age of Onset   Cancer Mother 66       uterian and ovarian    Goiter Mother    Stroke Father    Breast cancer Sister 76   Stroke Brother    Breast cancer Sister 11   Heart murmur Sister    Social History   Socioeconomic History   Marital status: Single    Spouse name: Not on file   Number of children: Not on file   Years of education: Not on file   Highest education  level: Not on file  Occupational History   Not on file  Tobacco Use   Smoking status: Former    Packs/day: 3.00    Years: 34.00    Total pack years: 102.00    Types: Cigarettes    Quit date: 07/10/2013    Years since quitting: 9.1   Smokeless tobacco: Never  Vaping Use   Vaping Use: Former  Substance and Sexual Activity   Alcohol use: No    Alcohol/week: 0.0 standard drinks of alcohol   Drug use: No   Sexual activity: Not Currently  Other Topics Concern  Not on file  Social History Narrative   Not on file   Social Determinants of Health   Financial Resource Strain: Low Risk  (08/14/2022)   Overall Financial Resource Strain (CARDIA)    Difficulty of Paying Living Expenses: Not very hard  Food Insecurity: No Food Insecurity (08/14/2022)   Hunger Vital Sign    Worried About Running Out of Food in the Last Year: Never true    Ran Out of Food in the Last Year: Never true  Transportation Needs: No Transportation Needs (08/14/2022)   PRAPARE - Hydrologist (Medical): No    Lack of Transportation (Non-Medical): No  Physical Activity: Insufficiently Active (08/14/2022)   Exercise Vital Sign    Days of Exercise per Week: 7 days    Minutes of Exercise per Session: 20 min  Stress: No Stress Concern Present (08/14/2022)   [place]    Feeling of Stress : Not at all  Social Connections: Socially Isolated (08/14/2022)   Social Connection and Isolation Panel [NHANES]    Frequency of Communication with Friends and Family: More than three times a week    Frequency of Social Gatherings with Friends and Family: Twice a week    Attends Religious Services: Never    Marine scientist or Organizations: No    Attends Music therapist: Never    Marital Status: Divorced    Tobacco Counseling Counseling given: Not Answered   Clinical Intake:  Pre-visit preparation  completed: Yes        Diabetes: Yes (Followed by PCP)  How often do you need to have someone help you when you read instructions, pamphlets, or other written materials from your doctor or pharmacy?: 1 - Never  Nutrition Risk Assessment: Does the patient have any non-healing wounds?  No  Has the patient had any unintentional weight loss or weight gain?  No   Financial Strains and Diabetes Management: Are you having any financial strains with the device, your supplies or your medication? No .  Does the patient want to be seen by Chronic Care Management for management of their diabetes?  No  Would the patient like to be referred to a Nutritionist or for Diabetic Management?  No    Interpreter Needed?: No      Activities of Daily Living    08/14/2022   10:05 AM  In your present state of health, do you have any difficulty performing the following activities:  Hearing? 0  Vision? 0  Difficulty concentrating or making decisions? 0  Walking or climbing stairs? 0  Dressing or bathing? 0  Doing errands, shopping? 0  Preparing Food and eating ? N  Using the Toilet? N  In the past six months, have you accidently leaked urine? N  Do you have problems with loss of bowel control? N  Managing your Medications? N  Managing your Finances? N  Housekeeping or managing your Housekeeping? N    Patient Care Team: Leone Haven, MD as PCP - General (Family Medicine) Bary Castilla, Forest Gleason, MD (General Surgery) Minna Merritts, MD as Consulting Physician (Cardiology)  Indicate any recent Medical Services you may have received from other than [facility] providers in the past year (date may be approximate).     Assessment:   This is a routine wellness examination for Rylen.  I connected with  Noura H Hine on 08/14/22 by a audio enabled telemedicine application and verified that  I am speaking with the correct person using two identifiers.  Patient Location: Home  Provider Location:  Office/Clinic  I discussed the limitations of evaluation and management by telemedicine. The patient expressed understanding and agreed to proceed.   Hearing/Vision screen Hearing Screening - Comments:: Patient is able to hear conversational tones without difficulty.  No issues reported.   Vision Screening - Comments:: Wears corrective lenses They have seen their ophthalmologist in the last 12 months. No retinopathy reported . Followed by Dr. Ellin Mayhew.    Dietary issues and exercise activities discussed: Current Exercise Habits: Home exercise routine, Time (Minutes): 20, Frequency (Times/Week): 7, Weekly Exercise (Minutes/Week): 140, Intensity: Mild  She tries to have a healthy diet. Monitors sugar intake.   Goals Addressed               This Visit's Progress     Patient Stated     Weight (lb) < 200 lb (90.7 kg) (pt-stated)   253 lb (114.8 kg)     Eat healthy - salad, eggs, healthy soup Exercise - walks 20 minutes daily, COPD limits this       Depression Screen    08/14/2022   10:03 AM 04/07/2022   10:10 AM 01/06/2022   10:38 AM 08/13/2021    8:37 AM 05/24/2021   11:03 AM 03/05/2021   11:08 AM 08/10/2020    8:43 AM  PHQ 2/9 Scores  PHQ - 2 Score 0 0 0 0 0 0 0    Fall Risk    08/14/2022   10:05 AM 04/07/2022   10:10 AM 01/06/2022   10:38 AM 08/13/2021    8:37 AM 05/24/2021   11:02 AM  Fall Risk   Falls in the past year? 0 0 0 0 0  Number falls in past yr: 0 0 0 0 0  Injury with Fall? 0 0 0    Risk for fall due to :  No Fall Risks No Fall Risks    Follow up Falls evaluation completed;Falls prevention discussed Falls evaluation completed Falls evaluation completed Falls evaluation completed Falls evaluation completed    FALL RISK PREVENTION PERTAINING TO THE HOME: Home free of loose throw rugs in walkways, pet beds, electrical cords, etc? Yes  Adequate lighting in your home to reduce risk of falls? Yes   ASSISTIVE DEVICES UTILIZED TO PREVENT FALLS: Life alert?  No  Use of a cane, walker or w/c? No  Grab bars in the bathroom? No  Shower chair or bench in shower? No  Elevated toilet seat or a handicapped toilet? No   TIMED UP AND GO: Was the test performed? No .   Cognitive Function:    12/24/2017    9:28 AM  MMSE - Mini Mental State Exam  Orientation to time 5  Orientation to Place 5  Registration 3  Attention/ Calculation 5  Recall 2  Language- name 2 objects 2  Language- repeat 1  Language- follow 3 step command 3  Language- read & follow direction 1  Write a sentence 1  Copy design 1  Total score 29        08/14/2022   10:06 AM 08/13/2021    8:41 AM 08/10/2020    8:59 AM 08/10/2019    8:48 AM 12/24/2017    9:31 AM  6CIT Screen  What Year? 0 points 0 points 0 points 0 points 0 points  What month? 0 points 0 points 0 points 0 points 0 points  What time? 0 points 0 points  0 points 0 points 0 points  Count back from 20 0 points 0 points 0 points 0 points 0 points  Months in reverse 0 points 0 points 0 points 0 points 0 points  Repeat phrase 0 points 0 points 0 points 0 points 0 points  Total Score 0 points 0 points 0 points 0 points 0 points    Immunizations Immunization History  Administered Date(s) Administered   Influenza,inj,Quad PF,6+ Mos 07/02/2015, 08/19/2016, 08/07/2017, 06/16/2018, 06/29/2019, 07/31/2020, 07/05/2021   Moderna SARS-COV2 Booster Vaccination 11/05/2020   Moderna Sars-Covid-2 Vaccination 03/08/2020, 04/05/2020   Pneumococcal Polysaccharide-23 09/26/2015   Pneumococcal-Unspecified 11/23/2020   Flu Vaccine status: Due, Education has been provided regarding the importance of this vaccine. Advised may receive this vaccine at local pharmacy or Health Dept. Aware to provide a copy of the vaccination record if obtained from local pharmacy or Health Dept. Verbalized acceptance and understanding.  Shingrix Completed?: No.    Education has been provided regarding the importance of this vaccine. Patient has  been advised to call insurance company to determine out of pocket expense if they have not yet received this vaccine. Advised may also receive vaccine at local pharmacy or Health Dept. Verbalized acceptance and understanding.  Screening Tests Health Maintenance  Topic Date Due   COVID-19 Vaccine (3 - Moderna risk series) 08/30/2022 (Originally 12/03/2020)   Zoster Vaccines- Shingrix (1 of 2) 11/14/2022 (Originally [birth year]/1983)   INFLUENZA VACCINE  12/28/2022 (Originally 04/29/2022)   HEMOGLOBIN A1C  10/08/2022   Diabetic kidney evaluation - GFR measurement  11/26/2022   FOOT EXAM  01/07/2023   OPHTHALMOLOGY EXAM  02/05/2023   Diabetic kidney evaluation - Urine ACR  04/08/2023   Lung Cancer Screening  07/17/2023   Medicare Annual Wellness (AWV)  08/15/2023   MAMMOGRAM  04/30/2024   TETANUS/TDAP  06/10/2025   COLONOSCOPY (Pts 45-34yr Insurance coverage will need to be confirmed)  12/31/2025   PAP SMEAR-Modifier  02/05/2026   Hepatitis C Screening  Completed   HIV Screening  Completed   HPV VACCINES  Aged Out    Health Maintenance There are no preventive care reminders to display for this patient.  CT Chest LCS Nodule f/u low dose with contrast completed 07/17/22.   Hepatitis C Screening: Completed 2018.  Vision Screening: Recommended annual ophthalmology exams for early detection of glaucoma and other disorders of the eye.  Dental Screening: Recommended annual dental exams for proper oral hygiene  Community Resource Referral / Chronic Care Management: CRR required this visit?  No   CCM required this visit?  No      Plan:     I have personally reviewed and noted the following in the patient's chart:   Medical and social history Use of alcohol, tobacco or illicit drugs  Current medications and supplements including opioid prescriptions. Patient is not currently taking opioid prescriptions. Functional ability and status Nutritional status Physical activity Advanced  directives List of other physicians Hospitalizations, surgeries, and ER visits in previous 12 months Vitals Screenings to include cognitive, depression, and falls Referrals and appointments  In addition, I have reviewed and discussed with patient certain preventive protocols, quality metrics, and best practice recommendations. A written personalized care plan for preventive services as well as general preventive health recommendations were provided to patient.     DLeta Jungling LPN   [id number]

## 2022-08-14 NOTE — Patient Instructions (Addendum)
[name] , Thank you for taking time to come for your Medicare Wellness Visit. I appreciate your ongoing commitment to your health goals. Please review the following plan we discussed and let me know if I can assist you in the future.   These are the goals we discussed:  Goals       Patient Stated     Weight (lb) < 200 lb (90.7 kg) (pt-stated)      Eat healthy - salad, eggs, healthy soup Exercise - walks 20 minutes daily, COPD limits this        This is a list of the screening recommended for you and due dates:  Health Maintenance  Topic Date Due   COVID-19 Vaccine (3 - Moderna risk series) 08/30/2022*   Zoster (Shingles) Vaccine (1 of 2) 11/14/2022*   Flu Shot  12/28/2022*   Hemoglobin A1C  10/08/2022   Yearly kidney function blood test for diabetes  11/26/2022   Complete foot exam   01/07/2023   Eye exam for diabetics  02/05/2023   Yearly kidney health urinalysis for diabetes  04/08/2023   Screening for Lung Cancer  07/17/2023   Medicare Annual Wellness Visit  08/15/2023   Mammogram  04/30/2024   Tetanus Vaccine  06/10/2025   Colon Cancer Screening  12/31/2025   Pap Smear  02/05/2026   Hepatitis C Screening: USPSTF Recommendation to screen - Ages 18-79 yo.  Completed   HIV Screening  Completed   HPV Vaccine  Aged Out  *Topic was postponed. The date shown is not the original due date.    Conditions/risks identified: none new  Next appointment: Follow up in one year for your annual wellness visit.   Preventive Care 40-64 Years, Female Preventive care refers to lifestyle choices and visits with your health care provider that can promote health and wellness. What does preventive care include? A yearly physical exam. This is also called an annual well check. Dental exams once or twice a year. Routine eye exams. Ask your health care provider how often you should have your eyes checked. Personal lifestyle choices, including: Daily care of your teeth and gums. Regular  physical activity. Eating a healthy diet. Avoiding tobacco and drug use. Limiting alcohol use. Practicing safe sex. Taking low-dose aspirin daily starting at age 21. Taking vitamin and mineral supplements as recommended by your health care provider. What happens during an annual well check? The services and screenings done by your health care provider during your annual well check will depend on your age, overall health, lifestyle risk factors, and family history of disease. Counseling  Your health care provider may ask you questions about your: Alcohol use. Tobacco use. Drug use. Emotional well-being. Home and relationship well-being. Sexual activity. Eating habits. Work and work Statistician. Method of birth control. Menstrual cycle. Pregnancy history. Screening  You may have the following tests or measurements: Height, weight, and BMI. Blood pressure. Lipid and cholesterol levels. These may be checked every 5 years, or more frequently if you are over 68 years old. Skin check. Lung cancer screening. You may have this screening every year starting at age 108 if you have a 30-pack-year history of smoking and currently smoke or have quit within the past 15 years. Fecal occult blood test (FOBT) of the stool. You may have this test every year starting at age 46. Flexible sigmoidoscopy or colonoscopy. You may have a sigmoidoscopy every 5 years or a colonoscopy every 10 years starting at age 67. Hepatitis C blood test.  Hepatitis B blood test. Sexually transmitted disease (STD) testing. Diabetes screening. This is done by checking your blood sugar (glucose) after you have not eaten for a while (fasting). You may have this done every 1-3 years. Mammogram. This may be done every 1-2 years. Talk to your health care provider about when you should start having regular mammograms. This may depend on whether you have a family history of breast cancer. BRCA-related cancer screening. This may be  done if you have a family history of breast, ovarian, tubal, or peritoneal cancers. Pelvic exam and Pap test. This may be done every 3 years starting at age 33. Starting at age 29, this may be done every 5 years if you have a Pap test in combination with an HPV test. Bone density scan. This is done to screen for osteoporosis. You may have this scan if you are at high risk for osteoporosis. Discuss your test results, treatment options, and if necessary, the need for more tests with your health care provider. Vaccines  Your health care provider may recommend certain vaccines, such as: Influenza vaccine. This is recommended every year. Tetanus, diphtheria, and acellular pertussis (Tdap, Td) vaccine. You may need a Td booster every 10 years. Zoster vaccine. You may need this after age 55. Pneumococcal 13-valent conjugate (PCV13) vaccine. You may need this if you have certain conditions and were not previously vaccinated. Pneumococcal polysaccharide (PPSV23) vaccine. You may need one or two doses if you smoke cigarettes or if you have certain conditions. Talk to your health care provider about which screenings and vaccines you need and how often you need them. This information is not intended to replace advice given to you by your health care provider. Make sure you discuss any questions you have with your health care provider. Document Released: 10/12/2015 Document Revised: 06/04/2016 Document Reviewed: 07/17/2015 Elsevier Interactive Patient Education  2017 [place] Prevention in the Home Falls can cause injuries. They can happen to people of all ages. There are many things you can do to make your home safe and to help prevent falls. What can I do on the outside of my home? Regularly fix the edges of walkways and driveways and fix any cracks. Remove anything that might make you trip as you walk through a door, such as a raised step or threshold. Trim any bushes or trees on the path  to your home. Use bright outdoor lighting. Clear any walking paths of anything that might make someone trip, such as rocks or tools. Regularly check to see if handrails are loose or broken. Make sure that both sides of any steps have handrails. Any raised decks and porches should have guardrails on the edges. Have any leaves, snow, or ice cleared regularly. Use sand or salt on walking paths during winter. Clean up any spills in your garage right away. This includes oil or grease spills. What can I do in the bathroom? Use night lights. Install grab bars by the toilet and in the tub and shower. Do not use towel bars as grab bars. Use non-skid mats or decals in the tub or shower. If you need to sit down in the shower, use a plastic, non-slip stool. Keep the floor dry. Clean up any water that spills on the floor as soon as it happens. Remove soap buildup in the tub or shower regularly. Attach bath mats securely with double-sided non-slip rug tape. Do not have throw rugs and other things on the floor that  can make you trip. What can I do in the bedroom? Use night lights. Make sure that you have a light by your bed that is easy to reach. Do not use any sheets or blankets that are too big for your bed. They should not hang down onto the floor. Have a firm chair that has side arms. You can use this for support while you get dressed. Do not have throw rugs and other things on the floor that can make you trip. What can I do in the kitchen? Clean up any spills right away. Avoid walking on wet floors. Keep items that you use a lot in easy-to-reach places. If you need to reach something above you, use a strong step stool that has a grab bar. Keep electrical cords out of the way. Do not use floor polish or wax that makes floors slippery. If you must use wax, use non-skid floor wax. Do not have throw rugs and other things on the floor that can make you trip. What can I do with my stairs? Do not  leave any items on the stairs. Make sure that there are handrails on both sides of the stairs and use them. Fix handrails that are broken or loose. Make sure that handrails are as long as the stairways. Check any carpeting to make sure that it is firmly attached to the stairs. Fix any carpet that is loose or worn. Avoid having throw rugs at the top or bottom of the stairs. If you do have throw rugs, attach them to the floor with carpet tape. Make sure that you have a light switch at the top of the stairs and the bottom of the stairs. If you do not have them, ask someone to add them for you. What else can I do to help prevent falls? Wear shoes that: Do not have high heels. Have rubber bottoms. Are comfortable and fit you well. Are closed at the toe. Do not wear sandals. If you use a stepladder: Make sure that it is fully opened. Do not climb a closed stepladder. Make sure that both sides of the stepladder are locked into place. Ask someone to hold it for you, if possible. Clearly mark and make sure that you can see: Any grab bars or handrails. First and last steps. Where the edge of each step is. Use tools that help you move around (mobility aids) if they are needed. These include: Canes. Walkers. Scooters. Crutches. Turn on the lights when you go into a dark area. Replace any light bulbs as soon as they burn out. Set up your furniture so you have a clear path. Avoid moving your furniture around. If any of your floors are uneven, fix them. If there are any pets around you, be aware of where they are. Review your medicines with your doctor. Some medicines can make you feel dizzy. This can increase your chance of falling. Ask your doctor what other things that you can do to help prevent falls. This information is not intended to replace advice given to you by your health care provider. Make sure you discuss any questions you have with your health care provider. Document Released:  07/12/2009 Document Revised: 02/21/2016 Document Reviewed: 10/20/2014 Elsevier Interactive Patient Education  2017 [employer].

## 2022-08-18 DIAGNOSIS — J449 Chronic obstructive pulmonary disease, unspecified: Secondary | ICD-10-CM | POA: Diagnosis not present

## 2022-08-19 ENCOUNTER — Telehealth: Payer: Self-pay

## 2022-08-19 NOTE — Progress Notes (Signed)
[facility]) [place]   08/19/2022  Martha Woodard 06/19/1966 [id number]   Reason for referral: Medication Assistance with [facility]  Referral source:  [employer] Technician   Reason for call: 2024 Medication assistance re-enrollment   Outreach:  Unsuccessful telephone call attempt #3 to patient.   HIPAA compliant voicemail left requesting a return call  Plan:  -I will close [place] case at this time as I have been unable to establish and/or maintain contact with patient.  -I am happy to assist in the future as needed.  -Thank you for allowing [facility] pharmacy to be involved in this patient's care.   Kristeen Miss, PharmD Clinical Pharmacist [place] Cell: [phone]

## 2022-08-25 DIAGNOSIS — M5432 Sciatica, left side: Secondary | ICD-10-CM | POA: Diagnosis not present

## 2022-08-25 DIAGNOSIS — M9904 Segmental and somatic dysfunction of sacral region: Secondary | ICD-10-CM | POA: Diagnosis not present

## 2022-08-25 DIAGNOSIS — M9905 Segmental and somatic dysfunction of pelvic region: Secondary | ICD-10-CM | POA: Diagnosis not present

## 2022-08-25 DIAGNOSIS — M955 Acquired deformity of pelvis: Secondary | ICD-10-CM | POA: Diagnosis not present

## 2022-08-27 ENCOUNTER — Ambulatory Visit (INDEPENDENT_AMBULATORY_CARE_PROVIDER_SITE_OTHER): Payer: Medicare Other | Admitting: Family Medicine

## 2022-08-27 ENCOUNTER — Encounter: Payer: Self-pay | Admitting: Family Medicine

## 2022-08-27 VITALS — BP 122/74 | HR 88 | Temp 98.5°F | Ht 66.0 in | Wt 255.6 lb

## 2022-08-27 DIAGNOSIS — E1159 Type 2 diabetes mellitus with other circulatory complications: Secondary | ICD-10-CM

## 2022-08-27 DIAGNOSIS — J988 Other specified respiratory disorders: Secondary | ICD-10-CM | POA: Insufficient documentation

## 2022-08-27 DIAGNOSIS — E041 Nontoxic single thyroid nodule: Secondary | ICD-10-CM | POA: Diagnosis not present

## 2022-08-27 DIAGNOSIS — I10 Essential (primary) hypertension: Secondary | ICD-10-CM | POA: Diagnosis not present

## 2022-08-27 MED ORDER — DOXYCYCLINE HYCLATE 100 MG PO TABS: 100.0000 mg | ORAL_TABLET | Freq: Two times a day (BID) | ORAL | 0 refills | Status: DC

## 2022-08-27 MED ORDER — PREDNISONE 20 MG PO TABS: 40.0000 mg | ORAL_TABLET | Freq: Every day | ORAL | 0 refills | Status: DC

## 2022-08-27 NOTE — Assessment & Plan Note (Signed)
Benign.  She will continue to see ENT.

## 2022-08-27 NOTE — Assessment & Plan Note (Signed)
Patient with likely upper respiratory infection though does have some components of a lower respiratory issue that could indicate a COPD exacerbation.  Will treat with prednisone 40 mg daily for 5 days and doxycycline 100 mg twice daily for 7 days.  If she has worsening symptoms she will seek medical attention.  We will also check her for COVID today.  She was advised to remain quarantined at home until we get the COVID test result back.

## 2022-08-27 NOTE — Assessment & Plan Note (Signed)
Patient will have an A1c in a week or 2 when she has recovered from her respiratory illness.  She will continue metformin 1000 mg twice daily.

## 2022-08-27 NOTE — Assessment & Plan Note (Signed)
Well-controlled.  She will continue Lasix 20 mg daily, HCTZ 12.5 mg daily, losartan 25 mg daily, and metoprolol succinate 25 mg daily.

## 2022-08-27 NOTE — Patient Instructions (Signed)
Nice to see you. We will contact you with your COVID test result. Please take the prednisone and doxycycline.  If you have any worsening symptoms please let us know. Will have you return in 1 to 2 weeks for labs and your flu shot.

## 2022-08-27 NOTE — Progress Notes (Signed)
[name], MD Phone: [phone]  Martha Woodard is a 56 y.o. female who presents today for f/u.  DIABETES Disease Monitoring: Blood Sugar ranges-140-150 highest  Polyuria/phagia/dipsia- no      Optho- UTD Medications: Compliance- taking metformin Hypoglycemic symptoms- no  HYPERTENSION Disease Monitoring Home BP Monitoring 120s/70s Chest pain- no    Dyspnea- no Medications Compliance-  taking HCTZ, losartan, metoprolol, Lasix.  Edema- no BMET    Component Value Date/Time   NA 142 11/26/2021 0955   K 4.1 11/26/2021 0955   CL 101 11/26/2021 0955   CO2 33 (H) 11/26/2021 0955   GLUCOSE 112 (H) 11/26/2021 0955   BUN 13 11/26/2021 0955   BUN 8 02/10/2014 1112   CREATININE 0.67 11/26/2021 0955   CALCIUM 9.7 11/26/2021 0955   GFRNONAA >60 12/03/2018 1503   GFRAA >60 12/03/2018 1503   Thyroid nodule: Patient underwent biopsy which revealed a Bethesda 2 benign thyroid nodule.  She notes she followed up with ENT and they advised everything looked good.  She notes she had some bruising and some voice change after the biopsy.  She notes she discussed that with ENT.  She notes the bruising has improved.  Occasionally her voice goes in and out.  She sees ENT for follow-up in March.  Respiratory infection: Patient notes 2 to 3 days of sinus congestion and cough.  She is a little more short of breath than usual.  Not much wheezing.  No fevers.  She does have some postnasal drip.  Flonase has helped some with the congestion.  She has been using her inhalers.   Social History   Tobacco Use  Smoking Status Former   Packs/day: 3.00   Years: 34.00   Total pack years: 102.00   Types: Cigarettes   Quit date: 07/10/2013   Years since quitting: 9.1  Smokeless Tobacco Never    Current Outpatient Medications on File Prior to Visit  Medication Sig Dispense Refill   ACCU-CHEK AVIVA PLUS test strip TEST UP TO 4 TIMES DAILY AS DIRECTED 400 strip 3   Accu-Chek Softclix Lancets lancets  TEST UP TO 4 TIMES DAILY 400 each 3   acetaminophen (TYLENOL) 650 MG CR tablet Take 650 mg by mouth every 8 (eight) hours as needed for pain.     albuterol (PROVENTIL) (2.5 MG/3ML) 0.083% nebulizer solution Take 3 mLs (2.5 mg total) by nebulization every 6 (six) hours as needed for wheezing or shortness of breath. 75 mL 12   aspirin EC 81 MG tablet Take 1 tablet (81 mg total) by mouth daily. 90 tablet 3   blood glucose meter kit and supplies Dispense based on patient and insurance preference. Use up to four times daily as directed. (FOR ICD-9 250.00, 250.01). 1 each 0   budesonide (PULMICORT FLEXHALER) 180 MCG/ACT inhaler Inhale 1-2 puffs into the lungs 2 (two) times daily. 3 each 3   calcium-vitamin D (OSCAL WITH D) 500-200 MG-UNIT tablet Take 1 tablet by mouth 2 (two) times daily.     cetirizine (ZYRTEC) 10 MG tablet Take 10 mg by mouth daily.     fluticasone (FLONASE) 50 MCG/ACT nasal spray USE 1 OR 2 SPRAYS IN EACH NOSTRIL ONCE DAILY. 16 g 12   furosemide (LASIX) 20 MG tablet Take 1 tablet (20 mg total) by mouth daily. 90 tablet 3   gabapentin (NEURONTIN) 100 MG capsule TAKE 1 CAPSULE BY MOUTH 3 TIMES  DAILY 270 capsule 3   Glycopyrrolate-Formoterol (BEVESPI AEROSPHERE) 9-4.8 MCG/ACT AERO Inhale 2 puffs  into the lungs 2 (two) times daily. 10.7 g 11   hydrochlorothiazide (MICROZIDE) 12.5 MG capsule TAKE 1 CAPSULE BY MOUTH DAILY 90 capsule 3   ketoconazole (NIZORAL) 2 % cream Apply 1 application topically daily. 15 g 0   ketoconazole (NIZORAL) 2 % shampoo Apply 1 application topically 2 (two) times a week. 120 mL 0   losartan (COZAAR) 25 MG tablet TAKE 1 TABLET BY MOUTH TWICE  DAILY 180 tablet 3   magnesium oxide (MAG-OX) 400 MG tablet Take 400 mg by mouth daily.     meloxicam (MOBIC) 15 MG tablet Take 1 tablet every day by oral route.     metFORMIN (GLUCOPHAGE) 500 MG tablet TAKE 2 TABLETS BY MOUTH  TWICE DAILY WITH MEALS 360 tablet 3   metoprolol succinate (TOPROL-XL) 25 MG 24 hr tablet TAKE 1  TABLET BY MOUTH ONCE  DAILY 90 tablet 3   montelukast (SINGULAIR) 10 MG tablet TAKE 1 TABLET BY MOUTH AT  BEDTIME 90 tablet 3   Multiple Vitamin (MULTIVITAMIN) tablet Take 1 tablet by mouth daily.     nystatin (MYCOSTATIN) 100000 UNIT/ML suspension Take 5 mLs (500,000 Units total) by mouth 4 (four) times daily. X 7-10 days 473 mL 0   nystatin (MYCOSTATIN/NYSTOP) powder Apply 1 application topically 2 (two) times daily. 60 g 11   nystatin cream (MYCOSTATIN) Apply 1 application topically 2 (two) times daily. 30 g 0   ondansetron (ZOFRAN ODT) 4 MG disintegrating tablet Take 1 tablet (4 mg total) by mouth every 8 (eight) hours as needed for nausea or vomiting. 20 tablet 0   OXYGEN Inhale 2 L into the lungs daily. 2 liters at night, 1 liter when doing activities, and no oxygen when at home during the day.     potassium chloride (KLOR-CON) 10 MEQ tablet TAKE 1 TABLET BY MOUTH  TWICE DAILY 180 tablet 3   PROVENTIL HFA 108 (90 Base) MCG/ACT inhaler Inhale 2 puffs into the lungs every 6 (six) hours as needed for wheezing or shortness of breath. 18 gram inhaler 3 each 1   rosuvastatin (CRESTOR) 20 MG tablet TAKE 1 TABLET BY MOUTH DAILY 90 tablet 3   sodium chloride (OCEAN) 0.65 % SOLN nasal spray Place 2 sprays into both nostrils as needed for congestion. 30 mL 11   vitamin C (ASCORBIC ACID) 500 MG tablet Take 1,000 mg by mouth daily.      Vitamin E 180 MG CAPS Take 1 capsule by mouth daily.     No current facility-administered medications on file prior to visit.     ROS see history of present illness  Objective  Physical Exam Vitals:   08/27/22 1040  BP: 122/74  Pulse: 88  Temp: 98.5 F (36.9 C)  SpO2: 96%    BP Readings from Last 3 Encounters:  08/27/22 122/74  06/16/22 126/60  04/07/22 130/80   Wt Readings from Last 3 Encounters:  08/27/22 255 lb 9.6 oz (115.9 kg)  08/14/22 253 lb (114.8 kg)  06/16/22 256 lb 9.6 oz (116.4 kg)    Physical Exam Constitutional:      General: She  is not in acute distress.    Appearance: She is not diaphoretic.  Cardiovascular:     Rate and Rhythm: Normal rate and regular rhythm.     Heart sounds: Normal heart sounds.  Pulmonary:     Effort: Pulmonary effort is normal.     Breath sounds: Normal breath sounds.  Skin:    General: Skin is warm and  dry.  Neurological:     Mental Status: She is alert.      Assessment/Plan: Please see individual problem list.  Problem List Items Addressed This Visit     DM type 2 (diabetes mellitus, type 2) ([place]) (Chronic)    Patient will have an A1c in a week or 2 when she has recovered from her respiratory illness.  She will continue metformin 1000 mg twice daily.      Relevant Orders   HgB A1c   Essential hypertension - Primary (Chronic)    Well-controlled.  She will continue Lasix 20 mg daily, HCTZ 12.5 mg daily, losartan 25 mg daily, and metoprolol succinate 25 mg daily.      Relevant Orders   Basic Metabolic Panel (BMET)   Thyroid nodule (Chronic)    Benign.  She will continue to see ENT.      Respiratory infection    Patient with likely upper respiratory infection though does have some components of a lower respiratory issue that could indicate a COPD exacerbation.  Will treat with prednisone 40 mg daily for 5 days and doxycycline 100 mg twice daily for 7 days.  If she has worsening symptoms she will seek medical attention.  We will also check her for COVID today.  She was advised to remain quarantined at home until we get the COVID test result back.      Relevant Medications   predniSONE (DELTASONE) 20 MG tablet   doxycycline (VIBRA-TABS) 100 MG tablet   Other Relevant Orders   Novel Coronavirus, NAA ([facility])     Return in about 1 week (around 09/03/2022) for labs and flu shot, 4 months with PCP.   [name], MD [place]

## 2022-08-28 LAB — NOVEL CORONAVIRUS, NAA: SARS-CoV-2, NAA: NOT DETECTED

## 2022-08-29 ENCOUNTER — Telehealth: Payer: Self-pay | Admitting: Family Medicine

## 2022-08-29 NOTE — Telephone Encounter (Signed)
Spoke to Martha Woodard regarding her covid test. Martha Woodard states her sickness has moved up to her chest and she would like to know if the medications Dr. Caryl Bis prescribed yesterday would take care of that? Per Dr. Caryl Bis yes it should take care of that. If Martha Woodard does not get better on the medications then she will have to be seen again. Martha Woodard verbalized understanding and is agreeable.

## 2022-08-29 NOTE — Telephone Encounter (Signed)
Patient called and note from lab, covid test was read. Patient said that now her cold is going into her upper chest. She wanted to know if the antibiotics that Dr Caryl Bis prescribed will help with that.

## 2022-09-08 DIAGNOSIS — M9905 Segmental and somatic dysfunction of pelvic region: Secondary | ICD-10-CM | POA: Diagnosis not present

## 2022-09-08 DIAGNOSIS — M5432 Sciatica, left side: Secondary | ICD-10-CM | POA: Diagnosis not present

## 2022-09-08 DIAGNOSIS — M9904 Segmental and somatic dysfunction of sacral region: Secondary | ICD-10-CM | POA: Diagnosis not present

## 2022-09-08 DIAGNOSIS — M955 Acquired deformity of pelvis: Secondary | ICD-10-CM | POA: Diagnosis not present

## 2022-09-10 ENCOUNTER — Ambulatory Visit (INDEPENDENT_AMBULATORY_CARE_PROVIDER_SITE_OTHER): Payer: Medicare Other

## 2022-09-10 DIAGNOSIS — I10 Essential (primary) hypertension: Secondary | ICD-10-CM | POA: Diagnosis not present

## 2022-09-10 DIAGNOSIS — E1159 Type 2 diabetes mellitus with other circulatory complications: Secondary | ICD-10-CM | POA: Diagnosis not present

## 2022-09-10 DIAGNOSIS — Z23 Encounter for immunization: Secondary | ICD-10-CM | POA: Diagnosis not present

## 2022-09-10 LAB — BASIC METABOLIC PANEL
BUN: 10 mg/dL (ref 6–23)
CO2: 37 mEq/L — ABNORMAL HIGH (ref 19–32)
Calcium: 10.5 mg/dL (ref 8.4–10.5)
Chloride: 99 mEq/L (ref 96–112)
Creatinine, Ser: 0.61 mg/dL (ref 0.40–1.20)
GFR: 98.84 mL/min (ref >60.00)
Glucose, Bld: 129 mg/dL — ABNORMAL HIGH (ref 70–99)
Potassium: 4 mEq/L (ref 3.5–5.1)
Sodium: 144 mEq/L (ref 135–145)

## 2022-09-10 LAB — HEMOGLOBIN A1C: Hgb A1c MFr Bld: 7.5 % — ABNORMAL HIGH (ref 4.6–6.5)

## 2022-09-15 ENCOUNTER — Emergency Department: Payer: Medicare Other

## 2022-09-15 ENCOUNTER — Other Ambulatory Visit: Payer: Self-pay

## 2022-09-15 ENCOUNTER — Ambulatory Visit: Payer: Medicare Other | Admitting: Pulmonary Disease

## 2022-09-15 ENCOUNTER — Inpatient Hospital Stay
Admission: EM | Admit: 2022-09-15 | Discharge: 2022-09-17 | DRG: 193 | Disposition: A | Discharge status: Home / Self Care | Payer: Medicare Other | Attending: Internal Medicine | Admitting: Internal Medicine

## 2022-09-15 DIAGNOSIS — Z1152 Encounter for screening for COVID-19: Secondary | ICD-10-CM | POA: Diagnosis not present

## 2022-09-15 DIAGNOSIS — J9621 Acute and chronic respiratory failure with hypoxia: Secondary | ICD-10-CM | POA: Diagnosis present

## 2022-09-15 DIAGNOSIS — Z79899 Other long term (current) drug therapy: Secondary | ICD-10-CM | POA: Diagnosis not present

## 2022-09-15 DIAGNOSIS — E785 Hyperlipidemia, unspecified: Secondary | ICD-10-CM | POA: Diagnosis present

## 2022-09-15 DIAGNOSIS — I152 Hypertension secondary to endocrine disorders: Secondary | ICD-10-CM | POA: Diagnosis present

## 2022-09-15 DIAGNOSIS — J111 Influenza due to unidentified influenza virus with other respiratory manifestations: Secondary | ICD-10-CM | POA: Diagnosis not present

## 2022-09-15 DIAGNOSIS — Z7984 Long term (current) use of oral hypoglycemic drugs: Secondary | ICD-10-CM

## 2022-09-15 DIAGNOSIS — R918 Other nonspecific abnormal finding of lung field: Secondary | ICD-10-CM | POA: Diagnosis present

## 2022-09-15 DIAGNOSIS — Z9981 Dependence on supplemental oxygen: Secondary | ICD-10-CM | POA: Diagnosis not present

## 2022-09-15 DIAGNOSIS — Z7982 Long term (current) use of aspirin: Secondary | ICD-10-CM

## 2022-09-15 DIAGNOSIS — I5032 Chronic diastolic (congestive) heart failure: Secondary | ICD-10-CM | POA: Diagnosis not present

## 2022-09-15 DIAGNOSIS — Z66 Do not resuscitate: Secondary | ICD-10-CM | POA: Diagnosis present

## 2022-09-15 DIAGNOSIS — E119 Type 2 diabetes mellitus without complications: Secondary | ICD-10-CM

## 2022-09-15 DIAGNOSIS — R059 Cough, unspecified: Secondary | ICD-10-CM | POA: Diagnosis not present

## 2022-09-15 DIAGNOSIS — E118 Type 2 diabetes mellitus with unspecified complications: Secondary | ICD-10-CM

## 2022-09-15 DIAGNOSIS — J101 Influenza due to other identified influenza virus with other respiratory manifestations: Secondary | ICD-10-CM | POA: Diagnosis not present

## 2022-09-15 DIAGNOSIS — Z7951 Long term (current) use of inhaled steroids: Secondary | ICD-10-CM | POA: Diagnosis not present

## 2022-09-15 DIAGNOSIS — Z881 Allergy status to other antibiotic agents status: Secondary | ICD-10-CM | POA: Diagnosis not present

## 2022-09-15 DIAGNOSIS — Z87891 Personal history of nicotine dependence: Secondary | ICD-10-CM | POA: Diagnosis not present

## 2022-09-15 DIAGNOSIS — E662 Morbid (severe) obesity with alveolar hypoventilation: Secondary | ICD-10-CM | POA: Diagnosis present

## 2022-09-15 DIAGNOSIS — J441 Chronic obstructive pulmonary disease with (acute) exacerbation: Secondary | ICD-10-CM | POA: Diagnosis not present

## 2022-09-15 DIAGNOSIS — I10 Essential (primary) hypertension: Secondary | ICD-10-CM | POA: Diagnosis present

## 2022-09-15 DIAGNOSIS — I110 Hypertensive heart disease with heart failure: Secondary | ICD-10-CM | POA: Diagnosis present

## 2022-09-15 DIAGNOSIS — E1159 Type 2 diabetes mellitus with other circulatory complications: Secondary | ICD-10-CM | POA: Diagnosis present

## 2022-09-15 DIAGNOSIS — Z6841 Body Mass Index (BMI) 40.0 and over, adult: Secondary | ICD-10-CM

## 2022-09-15 LAB — COMPREHENSIVE METABOLIC PANEL
ALT: 47 U/L — ABNORMAL HIGH (ref 0–44)
AST: 52 U/L — ABNORMAL HIGH (ref 15–41)
Albumin: 3.6 g/dL (ref 3.5–5.0)
Alkaline Phosphatase: 41 U/L (ref 38–126)
Anion gap: 8 (ref 5–15)
BUN: 16 mg/dL (ref 6–20)
CO2: 31 mmol/L (ref 22–32)
Calcium: 8.7 mg/dL — ABNORMAL LOW (ref 8.9–10.3)
Chloride: 98 mmol/L (ref 98–111)
Creatinine, Ser: 0.58 mg/dL (ref 0.44–1.00)
GFR, Estimated: >60 mL/min (ref >60)
Glucose, Bld: 170 mg/dL — ABNORMAL HIGH (ref 70–99)
Potassium: 3.5 mmol/L (ref 3.5–5.1)
Sodium: 137 mmol/L (ref 135–145)
Total Bilirubin: 0.5 mg/dL (ref 0.3–1.2)
Total Protein: 6.8 g/dL (ref 6.5–8.1)

## 2022-09-15 LAB — CBC
HCT: 41 % (ref 36.0–46.0)
Hemoglobin: 12.9 g/dL (ref 12.0–15.0)
MCH: 27.3 pg (ref 26.0–34.0)
MCHC: 31.5 g/dL (ref 30.0–36.0)
MCV: 86.7 fL (ref 80.0–100.0)
Platelets: 158 10*3/uL (ref 150–400)
RBC: 4.73 MIL/uL (ref 3.87–5.11)
RDW: 14.6 % (ref 11.5–15.5)
WBC: 5.2 10*3/uL (ref 4.0–10.5)
nRBC: 0 % (ref 0.0–0.2)

## 2022-09-15 LAB — RESP PANEL BY RT-PCR (RSV, FLU A&B, COVID)  RVPGX2
Influenza A by PCR: POSITIVE — AB
Influenza B by PCR: NEGATIVE
Resp Syncytial Virus by PCR: NEGATIVE
SARS Coronavirus 2 by RT PCR: NEGATIVE

## 2022-09-15 LAB — TROPONIN I (HIGH SENSITIVITY)
Troponin I (High Sensitivity): 22 ng/L — ABNORMAL HIGH (ref <18)
Troponin I (High Sensitivity): 18 ng/L — ABNORMAL HIGH (ref <18)

## 2022-09-15 LAB — PROCALCITONIN: Procalcitonin: <0.1 ng/mL

## 2022-09-15 MED ORDER — ONDANSETRON HCL 4 MG/2ML IJ SOLN
4.0000 mg | Freq: Four times a day (QID) | INTRAMUSCULAR | Status: DC | PRN
  Administered 2022-09-15 – 2022-09-17 (×2): 4 mg via INTRAVENOUS
  Filled 2022-09-15 (×2): qty 2

## 2022-09-15 MED ORDER — MONTELUKAST SODIUM 10 MG PO TABS
10.0000 mg | ORAL_TABLET | Freq: Every day | ORAL | Status: DC
  Administered 2022-09-15 – 2022-09-16 (×2): 10 mg via ORAL
  Filled 2022-09-15 (×2): qty 1

## 2022-09-15 MED ORDER — FLUTICASONE PROPIONATE 50 MCG/ACT NA SUSP
2.0000 | Freq: Every day | NASAL | Status: DC
  Administered 2022-09-15 – 2022-09-17 (×3): 2 via NASAL
  Filled 2022-09-15 (×2): qty 16

## 2022-09-15 MED ORDER — ENOXAPARIN SODIUM 40 MG/0.4ML IJ SOSY: 40.0000 mg | PREFILLED_SYRINGE | INTRAMUSCULAR | Status: DC

## 2022-09-15 MED ORDER — METHYLPREDNISOLONE SODIUM SUCC 125 MG IJ SOLR
125.0000 mg | INTRAMUSCULAR | Status: AC
  Administered 2022-09-15: 125 mg via INTRAVENOUS
  Filled 2022-09-15: qty 2

## 2022-09-15 MED ORDER — OSELTAMIVIR PHOSPHATE 75 MG PO CAPS
75.0000 mg | ORAL_CAPSULE | Freq: Once | ORAL | Status: AC
  Administered 2022-09-15: 75 mg via ORAL
  Filled 2022-09-15: qty 1

## 2022-09-15 MED ORDER — ROSUVASTATIN CALCIUM 10 MG PO TABS
20.0000 mg | ORAL_TABLET | Freq: Every day | ORAL | Status: DC
  Administered 2022-09-15 – 2022-09-17 (×3): 20 mg via ORAL
  Filled 2022-09-15: qty 2
  Filled 2022-09-15: qty 1
  Filled 2022-09-15: qty 2

## 2022-09-15 MED ORDER — PREDNISONE 20 MG PO TABS
40.0000 mg | ORAL_TABLET | Freq: Every day | ORAL | Status: DC
  Administered 2022-09-16 – 2022-09-17 (×2): 40 mg via ORAL
  Filled 2022-09-15 (×2): qty 2

## 2022-09-15 MED ORDER — OSELTAMIVIR PHOSPHATE 75 MG PO CAPS
75.0000 mg | ORAL_CAPSULE | Freq: Two times a day (BID) | ORAL | Status: DC
  Administered 2022-09-15 – 2022-09-17 (×4): 75 mg via ORAL
  Filled 2022-09-15 (×5): qty 1

## 2022-09-15 MED ORDER — ONDANSETRON HCL 4 MG/2ML IJ SOLN
4.0000 mg | INTRAMUSCULAR | Status: AC
  Administered 2022-09-15: 4 mg via INTRAVENOUS
  Filled 2022-09-15: qty 2

## 2022-09-15 MED ORDER — ENOXAPARIN SODIUM 60 MG/0.6ML IJ SOSY
0.5000 mg/kg | PREFILLED_SYRINGE | INTRAMUSCULAR | Status: DC
  Administered 2022-09-15 – 2022-09-16 (×2): 57.5 mg via SUBCUTANEOUS
  Filled 2022-09-15 (×2): qty 0.6

## 2022-09-15 MED ORDER — POTASSIUM CHLORIDE CRYS ER 20 MEQ PO TBCR
10.0000 meq | EXTENDED_RELEASE_TABLET | Freq: Two times a day (BID) | ORAL | Status: DC
  Administered 2022-09-15 – 2022-09-17 (×4): 10 meq via ORAL
  Filled 2022-09-15 (×4): qty 1

## 2022-09-15 MED ORDER — ACETAMINOPHEN 325 MG PO TABS
650.0000 mg | ORAL_TABLET | Freq: Four times a day (QID) | ORAL | Status: DC | PRN
  Administered 2022-09-15 – 2022-09-17 (×4): 650 mg via ORAL
  Filled 2022-09-15 (×4): qty 2

## 2022-09-15 MED ORDER — HYDROCHLOROTHIAZIDE 25 MG PO TABS: 12.5000 mg | ORAL_TABLET | Freq: Every day | ORAL | Status: DC

## 2022-09-15 MED ORDER — SODIUM CHLORIDE 0.9% FLUSH
3.0000 mL | Freq: Two times a day (BID) | INTRAVENOUS | Status: DC
  Administered 2022-09-15 – 2022-09-17 (×4): 3 mL via INTRAVENOUS

## 2022-09-15 MED ORDER — IPRATROPIUM-ALBUTEROL 0.5-2.5 (3) MG/3ML IN SOLN
3.0000 mL | Freq: Four times a day (QID) | RESPIRATORY_TRACT | Status: DC
  Administered 2022-09-15 – 2022-09-17 (×7): 3 mL via RESPIRATORY_TRACT
  Filled 2022-09-15 (×8): qty 3

## 2022-09-15 MED ORDER — METOPROLOL SUCCINATE ER 25 MG PO TB24
25.0000 mg | ORAL_TABLET | Freq: Every day | ORAL | Status: DC
  Administered 2022-09-15 – 2022-09-17 (×3): 25 mg via ORAL
  Filled 2022-09-15 (×3): qty 1

## 2022-09-15 MED ORDER — ASPIRIN 81 MG PO TBEC
81.0000 mg | DELAYED_RELEASE_TABLET | Freq: Every evening | ORAL | Status: DC
  Administered 2022-09-15 – 2022-09-16 (×2): 81 mg via ORAL
  Filled 2022-09-15 (×2): qty 1

## 2022-09-15 MED ORDER — SODIUM CHLORIDE 0.9 % IV BOLUS
500.0000 mL | Freq: Once | INTRAVENOUS | Status: AC
  Administered 2022-09-15: 500 mL via INTRAVENOUS

## 2022-09-15 MED ORDER — BUDESONIDE 0.5 MG/2ML IN SUSP
2.0000 mL | Freq: Two times a day (BID) | RESPIRATORY_TRACT | Status: DC
  Administered 2022-09-15 – 2022-09-17 (×4): 0.5 mg via RESPIRATORY_TRACT
  Filled 2022-09-15 (×4): qty 2

## 2022-09-15 MED ORDER — IPRATROPIUM-ALBUTEROL 20-100 MCG/ACT IN AERS
2.0000 | INHALATION_SPRAY | RESPIRATORY_TRACT | Status: AC
  Administered 2022-09-15: 2 via RESPIRATORY_TRACT
  Filled 2022-09-15: qty 4

## 2022-09-15 MED ORDER — FUROSEMIDE 20 MG PO TABS
20.0000 mg | ORAL_TABLET | Freq: Every day | ORAL | Status: DC
  Administered 2022-09-15 – 2022-09-17 (×3): 20 mg via ORAL
  Filled 2022-09-15 (×3): qty 1

## 2022-09-15 MED ORDER — LOSARTAN POTASSIUM 25 MG PO TABS
25.0000 mg | ORAL_TABLET | Freq: Two times a day (BID) | ORAL | Status: DC
  Administered 2022-09-16 – 2022-09-17 (×3): 25 mg via ORAL
  Filled 2022-09-15 (×3): qty 1

## 2022-09-15 MED ORDER — INSULIN ASPART 100 UNIT/ML IJ SOLN
0.0000 [IU] | Freq: Three times a day (TID) | INTRAMUSCULAR | Status: DC
  Administered 2022-09-16: 4 [IU] via SUBCUTANEOUS
  Administered 2022-09-16: 3 [IU] via SUBCUTANEOUS
  Administered 2022-09-16: 7 [IU] via SUBCUTANEOUS
  Administered 2022-09-17: 3 [IU] via SUBCUTANEOUS
  Administered 2022-09-17: 11 [IU] via SUBCUTANEOUS
  Filled 2022-09-15 (×5): qty 1

## 2022-09-15 NOTE — Assessment & Plan Note (Signed)
Continue losartan and toprol ?

## 2022-09-15 NOTE — H&P (Signed)
History and Physical    Patient: Martha Woodard LFY:[id number] DOB: [birth year] DOA: 09/15/2022 DOS: the patient was seen and examined on 09/15/2022 PCP: Leone Haven, MD  Patient coming from: Home  Chief Complaint:  Chief Complaint  Patient presents with   Cough   HPI: DOLLIE BRESSI is a 56 y.o. female with medical history significant of COPD on 1 L of supplemental oxygen as needed, type 2 diabetes, hypertension, hyperlipidemia, HFpEF,  Ms. Madaris states that on 12/15, she developed a cough that is occasionally productive in addition to shortness of breath, chills and diffuse bodyaches.  In addition, she has been experiencing nausea without vomiting, diarrhea or abdominal pain.  She is occasional experiencing abdominal cramps due to cough.  She notes that she wears 2 L of oxygen when sleeping usually.  Symptoms have progressively worsened since initial onset on the 15th.  Per chart review, patient was treated for possible COPD exacerbation on 11/29 with 5-day course of prednisone and 7-day course of doxycycline.  ED course: On arrival to the ED, patient was normotensive at 120/79 with heart rate of 90.  She was saturating at 83% on room air with respiratory rate of 22.  She improved to 90% on 3 L nasal cannula.  Initial workup remarkable for positive influenza A PCR, AST of 52, ALT 47, and chest x-ray with changes concerning for acute bronchitis and possible right basilar opacity.  TRH contacted for admission for COPD exacerbation and acute on chronic hypoxic respiratory failure in the setting of influenza.  Review of Systems: As mentioned in the history of present illness. All other systems reviewed and are negative.  Past Medical History:  Diagnosis Date   CHF (congestive heart failure) ([place])    Colon polyp 01-01-2016   COPD (chronic obstructive pulmonary disease) (HCC)    Diabetes mellitus without complication (HCC)    Diastolic heart failure (HCC)    HLD (hyperlipidemia)     Hypertension    Morbid obesity with BMI of 45.0-49.9, adult ([place])    Multiple lung nodules on CT    Shortness of breath dyspnea    Past Surgical History:  Procedure Laterality Date   BIOPSY THYROID Right 02-06-14   NEGATIVE FOR MALIGNANT CELLS proteinaceous material and macrophages.   BREAST BIOPSY Left    neg- core   COLONOSCOPY WITH PROPOFOL N/A 01/01/2016   Procedure: COLONOSCOPY WITH PROPOFOL;  Surgeon: Lucilla Lame, MD;  Location: [facility] ENDOSCOPY;  Service: Endoscopy;  Laterality: N/A;   thryoid fna Right April 2015   Proteinaceous material and macrophages.   WISDOM TOOTH EXTRACTION     Social History:  reports that she quit smoking about 9 years ago. Her smoking use included cigarettes. She has a 102.00 pack-year smoking history. She has never used smokeless tobacco. She reports that she does not drink alcohol and does not use drugs.  Allergies  Allergen Reactions   Augmentin [Amoxicillin-Pot Clavulanate] Itching    Hives Can take PCN and Amoxicillin alone     Family History  Problem Relation Age of Onset   Cancer Mother 4       uterian and ovarian    Goiter Mother    Stroke Father    Breast cancer Sister 54   Stroke Brother    Breast cancer Sister 31   Heart murmur Sister     Prior to Admission medications   Medication Sig Start Date End Date Taking? Authorizing Provider  ACCU-CHEK AVIVA PLUS test strip TEST UP TO 4  TIMES DAILY AS DIRECTED 12/11/21  Yes Dutch Quint B, FNP  Accu-Chek Softclix Lancets lancets TEST UP TO 4 TIMES DAILY 06/11/20  Yes Leone Haven, MD  acetaminophen (TYLENOL) 650 MG CR tablet Take 650 mg by mouth every 8 (eight) hours as needed for pain.   Yes [provider]  albuterol (PROVENTIL) (2.5 MG/3ML) 0.083% nebulizer solution Take 3 mLs (2.5 mg total) by nebulization every 6 (six) hours as needed for wheezing or shortness of breath. 10/23/20  Yes McLean-Scocuzza, Nino Glow, MD  aspirin EC 81 MG tablet Take 1 tablet (81 mg total) by  mouth daily. Patient taking differently: Take 81 mg by mouth every evening. 02/11/17  Yes Cook, Jayce G, DO  blood glucose meter kit and supplies Dispense based on patient and insurance preference. Use up to four times daily as directed. (FOR ICD-9 250.00, 250.01). 09/17/16  Yes Cook, Jayce G, DO  budesonide (PULMICORT FLEXHALER) 180 MCG/ACT inhaler Inhale 1-2 puffs into the lungs 2 (two) times daily. 10/17/21  Yes Leone Haven, MD  calcium-vitamin D (OSCAL WITH D) 500-200 MG-UNIT tablet Take 1 tablet by mouth 2 (two) times daily.   Yes [provider]  cetirizine (ZYRTEC) 10 MG tablet Take 10 mg by mouth daily.   Yes [provider]  fluticasone (FLONASE) 50 MCG/ACT nasal spray USE 1 OR 2 SPRAYS IN EACH NOSTRIL ONCE DAILY. 08/04/22  Yes Leone Haven, MD  furosemide (LASIX) 20 MG tablet Take 1 tablet (20 mg total) by mouth daily. 12/02/21  Yes Gollan, Kathlene November, MD  gabapentin (NEURONTIN) 100 MG capsule TAKE 1 CAPSULE BY MOUTH 3 TIMES  DAILY 12/05/21  Yes Dutch Quint B, FNP  Glycopyrrolate-Formoterol (BEVESPI AEROSPHERE) 9-4.8 MCG/ACT AERO Inhale 2 puffs into the lungs 2 (two) times daily. 06/06/21  Yes Tyler Pita, MD  hydrochlorothiazide (MICROZIDE) 12.5 MG capsule TAKE 1 CAPSULE BY MOUTH DAILY 12/30/21  Yes Gollan, Kathlene November, MD  losartan (COZAAR) 25 MG tablet TAKE 1 TABLET BY MOUTH TWICE  DAILY 12/30/21  Yes Gollan, Kathlene November, MD  magnesium oxide (MAG-OX) 400 MG tablet Take 400 mg by mouth daily.   Yes [provider]  metFORMIN (GLUCOPHAGE) 500 MG tablet TAKE 2 TABLETS BY MOUTH  TWICE DAILY WITH MEALS 02/25/22  Yes Leone Haven, MD  metoprolol succinate (TOPROL-XL) 25 MG 24 hr tablet TAKE 1 TABLET BY MOUTH ONCE  DAILY 12/30/21  Yes Gollan, Kathlene November, MD  montelukast (SINGULAIR) 10 MG tablet TAKE 1 TABLET BY MOUTH AT  BEDTIME 03/13/22  Yes Leone Haven, MD  Multiple Vitamin (MULTIVITAMIN) tablet Take 1 tablet by mouth daily.   Yes [provider]  potassium chloride (KLOR-CON) 10 MEQ tablet TAKE 1 TABLET BY MOUTH  TWICE DAILY 12/18/21  Yes Gollan, Kathlene November, MD  PROVENTIL HFA 108 (90 Base) MCG/ACT inhaler Inhale 2 puffs into the lungs every 6 (six) hours as needed for wheezing or shortness of breath. 18 gram inhaler 10/23/20  Yes McLean-Scocuzza, Nino Glow, MD  rosuvastatin (CRESTOR) 20 MG tablet TAKE 1 TABLET BY MOUTH DAILY 12/30/21  Yes Gollan, Kathlene November, MD  sodium chloride (OCEAN) 0.65 % SOLN nasal spray Place 2 sprays into both nostrils as needed for congestion. 10/23/20  Yes McLean-Scocuzza, Nino Glow, MD  vitamin C (ASCORBIC ACID) 500 MG tablet Take 1,000 mg by mouth daily.    Yes [provider]  Vitamin E 180 MG CAPS Take 180 mg by mouth daily.   Yes [provider]    Physical Exam: Vitals:   09/15/22 1023 09/15/22 1434  BP: 120/79 (!) 146/67  Pulse: 90 82  Resp: (!) 22 (!) 22  Temp: 99 F (37.2 C) 98.5 F (36.9 C)  TempSrc: Oral   SpO2: (!) 83% 90%  Weight: 116.1 kg   Height: _0  (1.676 m)    Physical Exam Vitals and nursing note reviewed.  Constitutional:      General: She is not in acute distress.    Appearance: She is obese. She is ill-appearing.  HENT:     Head: Normocephalic and atraumatic.     Mouth/Throat:     Mouth: Mucous membranes are moist.     Pharynx: Oropharynx is clear.  Eyes:     Conjunctiva/sclera: Conjunctivae normal.     Pupils: Pupils are equal, round, and reactive to light.  Cardiovascular:     Rate and Rhythm: Normal rate and regular rhythm.     Heart sounds: No murmur heard.    No gallop.  Pulmonary:     Effort: No respiratory distress.     Breath sounds: Decreased breath sounds (Poor air movement) and wheezing (Diffuse expiratory wheezing) present. No rhonchi or rales.  Abdominal:     General: Bowel sounds are normal.     Palpations: Abdomen is soft.  Musculoskeletal:     Cervical back: Neck supple.     Comments: Minimal pitting edema up to the ankles only   Skin:    General: Skin is warm and dry.  Neurological:     General: No focal deficit present.     Mental Status: She is oriented to person, place, and time.  Psychiatric:        Mood and Affect: Mood normal.        Behavior: Behavior normal.    Data Reviewed: CBC with WBC of 5.2, hemoglobin of 12.9 and platelets of 158. CMP with glucose of 170, calcium of 8.7, AST of 52 and ALT of 47.  Bicarb within normal limits at 31.  No other electrolyte abnormalities noted. Initial troponin elevated at 22 Influenza A PCR positive.  COVID-19 PCR and RSV PCR negative.  EKG personally sinus rhythm with rate of 87.  No acute changes concerning for acute ischemia.  Borderline PR patient.  DG Chest 2 View  Result Date: 09/15/2022 CLINICAL DATA:  Cough. EXAM: CHEST - 2 VIEW COMPARISON:  April 05, 2020 FINDINGS: Opacity in left base is stable since April 05, 2020, consistent with scar chronic atelectasis. Mild increased opacity in the right base. Coarsened lung markings diffusely. Bronchial cuffing identified. No pneumothorax. No nodules or masses. No change in the cardiomediastinal silhouette. Hyperinflation of the lungs with flattening of the diaphragm. IMPRESSION: 1. Hyperinflation of the lungs with flattening of the diaphragms suggesting COPD, emphysema, or asthma. 2. Bronchial cuffing and increased interstitial markings suggest bronchitic change. 3. Mild increased more focal opacity in the right base could represent atelectasis or early developing pneumonia. Recommend attention on follow-up and follow-up to resolution. 4. Chronic opacity in the left base is consistent with scar atelectasis. Electronically Signed   By: Dorise Bullion III M.D.   On: 09/15/2022 10:57    Results are pending, will review when available.  Assessment and Plan: * Influenza Patient presenting with 2-3 day history of cough, shortness of breath and bodyaches with PCR positive for influenza A.  She is on the borderline of timing  cutoff for Tamiflu, however due to high risk, will initiate at this time.  -  Tamiflu 75 mg twice daily - Tessalon Perles as needed for cough - Zofran as needed for nausea  COPD with acute exacerbation ([place]) Patient presenting with increased cough and shortness of breath in the setting of influenza A.  She is currently requiring 3 L supplemental oxygen to maintain her oxygen saturation above 88% at rest.  - Continue supplemental oxygen to maintain oxygen saturation above 88% - Wean as tolerated - DuoNebs every 6 hours - S/p Solu-Medrol 125 mg - Start prednisone 40 mg tomorrow - PT/OT  Obesity hypoventilation syndrome (HCC) No evidence of hypercapnia at this time.  Will monitor closely and avoid over oxygenation.  Essential hypertension - Continue home antihypertensives  DM type 2 (diabetes mellitus, type 2) ([place]) - Hold home antiglycemic agents - SSI, resistant  Chronic diastolic heart failure ([place]) - Restart home Lasix and potassium supplementation  Advance Care Planning:   Code Status: DNR.  Discussed CODE STATUS with patient.  She states that she has previously discussed with her family that she would not want CPR, however she is okay with intubation if necessary.  She states this is currently still in line with her goals at this time.  Consults: None  Family Communication: No family at bedside.  Severity of Illness: The appropriate patient status for this patient is OBSERVATION. Observation status is judged to be reasonable and necessary in order to provide the required intensity of service to ensure the patient's safety. The patient's presenting symptoms, physical exam findings, and initial radiographic and laboratory data in the context of their medical condition is felt to place them at decreased risk for further clinical deterioration. Furthermore, it is anticipated that the patient will be medically stable for discharge from the hospital within 2 midnights of admission.    Author: Jose Persia, MD 09/15/2022 6:05 PM  For on call review [email or web address].

## 2022-09-15 NOTE — ED Provider Notes (Signed)
[facility] Provider Note   Event Date/Time   First MD Initiated Contact with Patient 09/15/22 1153     (approximate)  History   Cough  HPI  DUDLEY COOLEY is a 56 y.o. female history of COPD hypertension heart failure  2 days ago started developing body aches chills fevers.  Is also been having some shortness of breath now typically uses 1 L of home oxygen has been having to use 2  She also had some loose stools and diarrhea.  Mild nausea but no abdominal pain.  Patient reports she wants to make sure she does not have COVID.  Also been experiencing some wheezing  No chest pain.     Physical Exam   Triage Vital Signs: ED Triage Vitals [09/15/22 1023]  Enc Vitals Group     BP 120/79     Pulse Rate 90     Resp (!) 22     Temp 99 F (37.2 C)     Temp Source Oral     SpO2 (!) 83 %     Weight 256 lb (116.1 kg)     Height '5\' 6"'$  (1.676 m)     Head Circumference      Peak Flow      Pain Score 6     Pain Loc      Pain Edu?      Excl. in [place]?     Most recent vital signs: Vitals:   09/15/22 1023 09/15/22 1434  BP: 120/79 (!) 146/67  Pulse: 90 82  Resp: (!) 22 (!) 22  Temp: 99 F (37.2 C) 98.5 F (36.9 C)  SpO2: (!) 83% 90%     General: Awake, no distress.  Does however appear mildly ill, slightly tachypneic mildly dyspneic. Saturation mid 90s now on 3 L nasal cannula CV:  Good peripheral perfusion.  Normal heart tones Resp:  Prolonged respiratory phase mild accessory muscle use and slight tachypnea, also moderate expiratory wheezing throughout fields.  No focal rales Abd:  No distention.  Soft nontenderOther:     ED Results / Procedures / Treatments   Labs (all labs ordered are listed, but only abnormal results are displayed) Labs Reviewed  RESP PANEL BY RT-PCR (RSV, FLU A&B, COVID)  [id number] - Abnormal; Notable for the following components:      Result Value   Influenza A by PCR POSITIVE (*)    All other components within normal  limits  COMPREHENSIVE METABOLIC PANEL - Abnormal; Notable for the following components:   Glucose, Bld 170 (*)    Calcium 8.7 (*)    AST 52 (*)    ALT 47 (*)    All other components within normal limits  TROPONIN I (HIGH SENSITIVITY) - Abnormal; Notable for the following components:   Troponin I (High Sensitivity) 22 (*)    All other components within normal limits  CBC  TROPONIN I (HIGH SENSITIVITY)     EKG  Entered by me at 1035 heart rate 90 QRS 100 QTc 440 Sinus tachycardia   RADIOLOGY  Chest x-ray interpreted by me as possible right lower lobe infiltrate.  Reviewed radiologist review as well, and they do note a possible infiltrate versus atelectasis  DG Chest 2 View  Result Date: 09/15/2022 CLINICAL DATA:  Cough. EXAM: CHEST - 2 VIEW COMPARISON:  April 05, 2020 FINDINGS: Opacity in left base is stable since April 05, 2020, consistent with scar chronic atelectasis. Mild increased opacity in the right base. Coarsened  lung markings diffusely. Bronchial cuffing identified. No pneumothorax. No nodules or masses. No change in the cardiomediastinal silhouette. Hyperinflation of the lungs with flattening of the diaphragm. IMPRESSION: 1. Hyperinflation of the lungs with flattening of the diaphragms suggesting COPD, emphysema, or asthma. 2. Bronchial cuffing and increased interstitial markings suggest bronchitic change. 3. Mild increased more focal opacity in the right base could represent atelectasis or early developing pneumonia. Recommend attention on follow-up and follow-up to resolution. 4. Chronic opacity in the left base is consistent with scar atelectasis. Electronically Signed   By: Dorise Bullion III M.D.   On: 09/15/2022 10:57      PROCEDURES:  Critical Care performed: No  Procedures   MEDICATIONS ORDERED IN ED: Medications  oseltamivir (TAMIFLU) capsule 75 mg (75 mg Oral Given 09/15/22 1436)  methylPREDNISolone sodium succinate (SOLU-MEDROL) 125 mg/2 mL injection 125 mg  (125 mg Intravenous Given 09/15/22 1435)  Ipratropium-Albuterol (COMBIVENT) respimat 2 puff (2 puffs Inhalation Given 09/15/22 1436)  sodium chloride 0.9 % bolus 500 mL (500 mLs Intravenous New Bag/Given 09/15/22 1444)  ondansetron (ZOFRAN) injection 4 mg (4 mg Intravenous Given 09/15/22 1435)     IMPRESSION / MDM / ASSESSMENT AND PLAN / ED COURSE  I reviewed the triage vital signs and the nursing notes.                              Differential diagnosis includes, but is not limited to, viral symptomatology, COVID, influenza, pneumonia, or other causation.  Patient's flu test has resulted positive and I think this explains the patient's symptoms well.  She is currently afebrile normal white count, and based on the positive flu test in 48 hours of symptom including body aches cough etc. I suspect this is likely viral in nature with about an obvious bacterial component though she does have a possible infiltrate versus atelectasis on chest x-ray, she will need to be followed clinically.  Also with wheezing appears to have mild to moderate COPD exacerbation.  She is currently in the hallway and we do not have a room that we can provide nebulizers for her and at the moment due to capacity, but will utilize inhaler, steroids, and started on Tamiflu.  She does note mild increased oxygen need from baseline  Patient's presentation is most consistent with acute presentation with potential threat to life or bodily function.   The patient is on the cardiac monitor to evaluate for evidence of arrhythmia and/or significant heart rate changes.  ----------------------------------------- 4:11 PM on 09/15/2022 ----------------------------------------- Patient understanding and agreeable with plan for admission.  Discussed case and patient admitted for influenza with associated COPD exacerbation to the service of Dr. Charleen Kirks     FINAL CLINICAL IMPRESSION(S) / ED DIAGNOSES   Final diagnoses:  Influenza   COPD with acute exacerbation ([place])     Rx / DC Orders   ED Discharge Orders     None        Note:  This document was prepared using Dragon voice recognition software and may include unintentional dictation errors.   Delman Kitten, MD 09/15/22 [phone]

## 2022-09-15 NOTE — ED Notes (Signed)
Pt placed on o2 at 3l per [place] and sats up to 93%.

## 2022-09-15 NOTE — Assessment & Plan Note (Addendum)
Patient presenting with 2-3 day history of cough, shortness of breath and bodyaches with PCR positive for influenza A.  She is on the borderline of timing cutoff for Tamiflu, however due to high risk, will initiate at this time.  - Tamiflu 75 mg twice daily - Tessalon Perles as needed for cough - Zofran as needed for nausea

## 2022-09-15 NOTE — ED Triage Notes (Signed)
Pt in with co cough and nausea for 2 days. Pt co pain and shob, states does have bodyaches. PT has hx of cpod, wears oxygen as needed.

## 2022-09-15 NOTE — Assessment & Plan Note (Signed)
-   continue lasix and toprol  - hold hctz, likely doesn't need both at discharge

## 2022-09-15 NOTE — Assessment & Plan Note (Addendum)
No evidence of hypercapnia at this time.  Will monitor closely and avoid over oxygenation.

## 2022-09-15 NOTE — Assessment & Plan Note (Signed)
-   continue SSI

## 2022-09-15 NOTE — Assessment & Plan Note (Signed)
Patient presenting with increased cough and shortness of breath in the setting of influenza A.  She is currently requiring 3 L supplemental oxygen to maintain her oxygen saturation above 88% at rest.  - Continue supplemental oxygen to maintain oxygen saturation above 88% - Wean as tolerated - DuoNebs every 6 hours - S/p Solu-Medrol 125 mg - Start prednisone 40 mg tomorrow - PT/OT

## 2022-09-16 ENCOUNTER — Encounter: Payer: Self-pay | Admitting: Internal Medicine

## 2022-09-16 DIAGNOSIS — Z66 Do not resuscitate: Secondary | ICD-10-CM | POA: Diagnosis present

## 2022-09-16 DIAGNOSIS — Z7982 Long term (current) use of aspirin: Secondary | ICD-10-CM | POA: Diagnosis not present

## 2022-09-16 DIAGNOSIS — Z87891 Personal history of nicotine dependence: Secondary | ICD-10-CM | POA: Diagnosis not present

## 2022-09-16 DIAGNOSIS — Z6841 Body Mass Index (BMI) 40.0 and over, adult: Secondary | ICD-10-CM | POA: Diagnosis not present

## 2022-09-16 DIAGNOSIS — I110 Hypertensive heart disease with heart failure: Secondary | ICD-10-CM | POA: Diagnosis present

## 2022-09-16 DIAGNOSIS — Z1152 Encounter for screening for COVID-19: Secondary | ICD-10-CM | POA: Diagnosis not present

## 2022-09-16 DIAGNOSIS — I5032 Chronic diastolic (congestive) heart failure: Secondary | ICD-10-CM | POA: Diagnosis present

## 2022-09-16 DIAGNOSIS — J441 Chronic obstructive pulmonary disease with (acute) exacerbation: Secondary | ICD-10-CM | POA: Diagnosis not present

## 2022-09-16 DIAGNOSIS — Z7984 Long term (current) use of oral hypoglycemic drugs: Secondary | ICD-10-CM | POA: Diagnosis not present

## 2022-09-16 DIAGNOSIS — Z881 Allergy status to other antibiotic agents status: Secondary | ICD-10-CM | POA: Diagnosis not present

## 2022-09-16 DIAGNOSIS — I10 Essential (primary) hypertension: Secondary | ICD-10-CM | POA: Diagnosis not present

## 2022-09-16 DIAGNOSIS — E785 Hyperlipidemia, unspecified: Secondary | ICD-10-CM | POA: Diagnosis present

## 2022-09-16 DIAGNOSIS — J111 Influenza due to unidentified influenza virus with other respiratory manifestations: Secondary | ICD-10-CM | POA: Diagnosis not present

## 2022-09-16 DIAGNOSIS — J449 Chronic obstructive pulmonary disease, unspecified: Secondary | ICD-10-CM | POA: Diagnosis not present

## 2022-09-16 DIAGNOSIS — E119 Type 2 diabetes mellitus without complications: Secondary | ICD-10-CM | POA: Diagnosis present

## 2022-09-16 DIAGNOSIS — R918 Other nonspecific abnormal finding of lung field: Secondary | ICD-10-CM | POA: Diagnosis present

## 2022-09-16 DIAGNOSIS — Z7951 Long term (current) use of inhaled steroids: Secondary | ICD-10-CM | POA: Diagnosis not present

## 2022-09-16 DIAGNOSIS — E662 Morbid (severe) obesity with alveolar hypoventilation: Secondary | ICD-10-CM | POA: Diagnosis present

## 2022-09-16 DIAGNOSIS — Z79899 Other long term (current) drug therapy: Secondary | ICD-10-CM | POA: Diagnosis not present

## 2022-09-16 DIAGNOSIS — Z9981 Dependence on supplemental oxygen: Secondary | ICD-10-CM | POA: Diagnosis not present

## 2022-09-16 DIAGNOSIS — J9621 Acute and chronic respiratory failure with hypoxia: Secondary | ICD-10-CM | POA: Diagnosis present

## 2022-09-16 DIAGNOSIS — J101 Influenza due to other identified influenza virus with other respiratory manifestations: Secondary | ICD-10-CM | POA: Diagnosis present

## 2022-09-16 LAB — COMPREHENSIVE METABOLIC PANEL
ALT: 57 U/L — ABNORMAL HIGH (ref 0–44)
AST: 61 U/L — ABNORMAL HIGH (ref 15–41)
Albumin: 3.7 g/dL (ref 3.5–5.0)
Alkaline Phosphatase: 42 U/L (ref 38–126)
Anion gap: 7 (ref 5–15)
BUN: 15 mg/dL (ref 6–20)
CO2: 34 mmol/L — ABNORMAL HIGH (ref 22–32)
Calcium: 8.6 mg/dL — ABNORMAL LOW (ref 8.9–10.3)
Chloride: 101 mmol/L (ref 98–111)
Creatinine, Ser: 0.5 mg/dL (ref 0.44–1.00)
GFR, Estimated: >60 mL/min (ref >60)
Glucose, Bld: 169 mg/dL — ABNORMAL HIGH (ref 70–99)
Potassium: 4.2 mmol/L (ref 3.5–5.1)
Sodium: 142 mmol/L (ref 135–145)
Total Bilirubin: 0.6 mg/dL (ref 0.3–1.2)
Total Protein: 7 g/dL (ref 6.5–8.1)

## 2022-09-16 LAB — GLUCOSE, CAPILLARY
Glucose-Capillary: 197 mg/dL — ABNORMAL HIGH (ref 70–99)
Glucose-Capillary: 206 mg/dL — ABNORMAL HIGH (ref 70–99)
Glucose-Capillary: 167 mg/dL — ABNORMAL HIGH (ref 70–99)

## 2022-09-16 LAB — PROCALCITONIN: Procalcitonin: <0.1 ng/mL

## 2022-09-16 LAB — CBC WITH DIFFERENTIAL/PLATELET
Abs Immature Granulocytes: 0.03 10*3/uL (ref 0.00–0.07)
Basophils Absolute: 0 10*3/uL (ref 0.0–0.1)
Basophils Relative: 0 %
Eosinophils Absolute: 0 10*3/uL (ref 0.0–0.5)
Eosinophils Relative: 0 %
HCT: 41.6 % (ref 36.0–46.0)
Hemoglobin: 12.9 g/dL (ref 12.0–15.0)
Immature Granulocytes: 1 %
Lymphocytes Relative: 18 %
Lymphs Abs: 0.9 10*3/uL (ref 0.7–4.0)
MCH: 27.4 pg (ref 26.0–34.0)
MCHC: 31 g/dL (ref 30.0–36.0)
MCV: 88.3 fL (ref 80.0–100.0)
Monocytes Absolute: 0.3 10*3/uL (ref 0.1–1.0)
Monocytes Relative: 7 %
Neutro Abs: 3.6 10*3/uL (ref 1.7–7.7)
Neutrophils Relative %: 74 %
Platelets: 182 10*3/uL (ref 150–400)
RBC: 4.71 MIL/uL (ref 3.87–5.11)
RDW: 14.2 % (ref 11.5–15.5)
WBC: 4.9 10*3/uL (ref 4.0–10.5)
nRBC: 0 % (ref 0.0–0.2)

## 2022-09-16 LAB — HIV ANTIBODY (ROUTINE TESTING W REFLEX): HIV Screen 4th Generation wRfx: NONREACTIVE

## 2022-09-16 LAB — CBG MONITORING, ED: Glucose-Capillary: 137 mg/dL — ABNORMAL HIGH (ref 70–99)

## 2022-09-16 NOTE — Progress Notes (Signed)
PT Cancellation Note  Patient Details Name: Martha Woodard MRN: [id number] DOB: 1966/04/08   Cancelled Treatment:    Reason Eval/Treat Not Completed: PT screened, no needs identified, will sign off Spoke with OT who saw pt earlier today, reports she was relatively independent and did not have further OT needs.  On entering pt's room she reports she has been getting up and walking in the room and neither wants nor feels she needs PT. She was thankful for PT checking in but pleasantly refuses as she is confident in her mobility and ability to go home once she is feeling/breathing better.  Per OT and pt report she does not appear to need further PT; should PT concerns arise before d/c please place new orders, will sign off at this time.      Kreg Shropshire, DPT 09/16/2022, 1:11 PM

## 2022-09-16 NOTE — Hospital Course (Signed)
Martha Woodard is a 56 yo female with PMH COPD on 1-2 L chronic O2, DMII, HTN, HLD, HFpEF who presented with SOB and cough. She also endorsed chills and body aches. She was found to have worsening hypoxia on admission and placed on 4L O2 as well. Workup was notable for Flu A and she was started on Tamiflu and steroids/nebs for COPD exacerbation.

## 2022-09-16 NOTE — Evaluation (Signed)
Occupational Therapy Evaluation Patient Details Name: Martha Woodard MRN: [id number] DOB: 1966-07-07 Today's Date: 09/16/2022   History of Present Illness 56 yo female with PMH COPD on 1-2 L chronic O2, DMII, HTN, HLD, HFpEF who presented with SOB and cough. She also endorsed chills and body aches. She was found to have worsening hypoxia on admission and placed on 4L O2 as well.  Workup was notable for Flu A and she was started on Tamiflu and steroids/nebs for COPD exacerbation.   Clinical Impression   Upon entering the room, pt supine in bed and agreeable to OT intervention. Pt reports living at home alone and independent at baseline. She is on 2Ls O2 at baseline. Pt demonstrates bed mobility and functional mobility within the room at Ind level without use of AD or LOB. Pt generally does not feel well and coughing throughout session , requiring increased time to complete tasks, but does not appear to need skilled OT intervention at this time. OT to complete order and pt agrees.     Recommendations for follow up therapy are one component of a multi-disciplinary discharge planning process, led by the attending physician.  Recommendations may be updated based on patient status, additional functional criteria and insurance authorization.   Follow Up Recommendations  No OT follow up     Assistance Recommended at Discharge None        Equipment Recommendations  None recommended by OT       Precautions / Restrictions Precautions Precautions: None      Mobility Bed Mobility Overal bed mobility: Independent                  Transfers Overall transfer level: Independent                        Balance Overall balance assessment: Independent                                         ADL either performed or assessed with clinical judgement   ADL Overall ADL's : At baseline;Independent                                              Vision Patient Visual Report: No change from baseline              Pertinent Vitals/Pain Pain Assessment Pain Assessment: No/denies pain     Hand Dominance Right   Extremity/Trunk Assessment Upper Extremity Assessment Upper Extremity Assessment: Overall WFL for tasks assessed   Lower Extremity Assessment Lower Extremity Assessment: Overall WFL for tasks assessed       Communication Communication Communication: No difficulties   Cognition Arousal/Alertness: Awake/alert Behavior During Therapy: WFL for tasks assessed/performed Overall Cognitive Status: Within Functional Limits for tasks assessed                                                  Home Living Family/patient expects to be discharged to:: Private residence Living Arrangements: Alone Available Help at Discharge: Family;Friend(s);Available PRN/intermittently Type of Home: House Home Access: Stairs to enter [employer] of Steps: 8 Entrance  Stairs-Rails: Right;Left Home Layout: One level     Bathroom Shower/Tub: Occupational psychologist: Standard     Home Equipment: Conservation officer, nature (2 wheels)          Prior Functioning/Environment Prior Level of Function : Independent/Modified Independent;Driving                                 OT Goals(Current goals can be found in the care plan section) Acute Rehab OT Goals Patient Stated Goal: to get better and return home OT Goal Formulation: With patient Time For Goal Achievement: 09/16/22 Potential to Achieve Goals: Good  OT Frequency:         AM-PAC OT "6 Clicks" Daily Activity     Outcome Measure Help from another person eating meals?: None Help from another person taking care of personal grooming?: None Help from another person toileting, which includes using toliet, bedpan, or urinal?: None Help from another person bathing (including washing, rinsing, drying)?: None Help from another person  to put on and taking off regular upper body clothing?: None Help from another person to put on and taking off regular lower body clothing?: None 6 Click Score: 24   End of Session Nurse Communication: Mobility status  Activity Tolerance: Patient tolerated treatment well Patient left: in bed;with call bell/phone within reach;with bed alarm set                   Time: 3335-4562 OT Time Calculation (min): 10 min Charges:  OT General Charges $OT Visit: 1 Visit OT Evaluation $OT Eval Low Complexity: 1 Low  Darleen Crocker, MS, OTR/L , CBIS ascom [phone]  09/16/22, 12:34 PM

## 2022-09-16 NOTE — Progress Notes (Signed)
Progress Note    XOCHITL EGLE   VEH:[id number]  DOB: 07/24/1966  DOA: 09/15/2022     0 PCP: Leone Haven, MD  Initial CC: SOB  Hospital Course: Ms. Mell is a 56 yo female with PMH COPD on 1-2 L chronic O2, DMII, HTN, HLD, HFpEF who presented with SOB and cough. She also endorsed chills and body aches. She was found to have worsening hypoxia on admission and placed on 4L O2 as well. Workup was notable for Flu A and she was started on Tamiflu and steroids/nebs for COPD exacerbation.   Interval History:  Still dyspneic and SOB in the ER. Remains on 4 L. Coughing ongoing. Not appropriate for discharge at this time. Will continue treatment further 1 more night.   Assessment and Plan: * Influenza Patient presenting with 2-3 day history of cough, shortness of breath and bodyaches with PCR positive for influenza A.   - continue tamiflu - continue droplet precautions  COPD with acute exacerbation (HCC) - SOB, cough, dyspnea, increased O2 requirement - hold off on abx for now; PCT also negative x 2 - continue nebs/breathing treatment - continue steroids  Obesity hypoventilation syndrome (HCC) No evidence of hypercapnia at this time.  Will monitor closely and avoid over oxygenation.  Essential hypertension - Continue losartan and toprol   DM type 2 (diabetes mellitus, type 2) ([place]) - continue SSI  Chronic diastolic heart failure ([place]) - continue lasix and toprol  - hold hctz, likely doesn't need both at discharge    Old records reviewed in assessment of this patient  Antimicrobials: Tamiflu 09/15/22 >> current   DVT prophylaxis:   Lovenox   Code Status:   Code Status: DNR  Mobility Assessment (last 72 hours)     Mobility Assessment     Row Name 09/16/22 1000 09/15/22 2200         Does patient have an order for bedrest or is patient medically unstable No - Continue assessment No - Continue assessment      What is the highest level of mobility based on  the progressive mobility assessment? Level 6 (Walks independently in room and hall) - Balance while walking in room without assist - Complete --               Barriers to discharge:  Disposition Plan:  Home 1-2 days Status is: Obs  Objective: Blood pressure 129/60, pulse 81, temperature 98.3 F (36.8 C), temperature source Oral, resp. rate 20, height '5\' 6"'$  (1.676 m), weight 116.1 kg, last menstrual period 04/10/2014, SpO2 95 %.  Examination:  Physical Exam Constitutional:      Appearance: Normal appearance.  HENT:     Head: Normocephalic and atraumatic.     Mouth/Throat:     Mouth: Mucous membranes are moist.  Eyes:     Extraocular Movements: Extraocular movements intact.  Cardiovascular:     Rate and Rhythm: Normal rate and regular rhythm.  Pulmonary:     Effort: Pulmonary effort is normal. No respiratory distress.     Breath sounds: Wheezing and rhonchi present.     Comments: More wheezing than rhonchi Abdominal:     General: Bowel sounds are normal. There is no distension.     Palpations: Abdomen is soft.     Tenderness: There is no abdominal tenderness.  Musculoskeletal:        General: No swelling. Normal range of motion.     Cervical back: Normal range of motion and neck supple.  Skin:  General: Skin is warm and dry.  Neurological:     General: No focal deficit present.     Mental Status: She is alert and oriented to person, place, and time.  Psychiatric:        Mood and Affect: Mood normal.        Behavior: Behavior normal.      Consultants:    Procedures:    Data Reviewed: Results for orders placed or performed during the hospital encounter of 09/15/22 (from the past 24 hour(s))  Troponin I (High Sensitivity)     Status: Abnormal   Collection Time: 09/15/22  6:06 PM  Result Value Ref Range   Troponin I (High Sensitivity) 18 (H) <18 ng/L  Procalcitonin - Baseline     Status: None   Collection Time: 09/15/22  6:06 PM  Result Value Ref Range    Procalcitonin <0.10 ng/mL  HIV Antibody (routine testing w rflx)     Status: None   Collection Time: 09/15/22  6:06 PM  Result Value Ref Range   HIV Screen 4th Generation wRfx Non Reactive Non Reactive  CBC with Differential/Platelet     Status: None   Collection Time: 09/16/22  4:29 AM  Result Value Ref Range   WBC 4.9 4.0 - 10.5 K/uL   RBC 4.71 3.87 - 5.11 MIL/uL   Hemoglobin 12.9 12.0 - 15.0 g/dL   HCT 41.6 36.0 - 46.0 %   MCV 88.3 80.0 - 100.0 fL   MCH 27.4 26.0 - 34.0 pg   MCHC 31.0 30.0 - 36.0 g/dL   RDW 14.2 11.5 - 15.5 %   Platelets 182 150 - 400 K/uL   nRBC 0.0 0.0 - 0.2 %   Neutrophils Relative % 74 %   Neutro Abs 3.6 1.7 - 7.7 K/uL   Lymphocytes Relative 18 %   Lymphs Abs 0.9 0.7 - 4.0 K/uL   Monocytes Relative 7 %   Monocytes Absolute 0.3 0.1 - 1.0 K/uL   Eosinophils Relative 0 %   Eosinophils Absolute 0.0 0.0 - 0.5 K/uL   Basophils Relative 0 %   Basophils Absolute 0.0 0.0 - 0.1 K/uL   Immature Granulocytes 1 %   Abs Immature Granulocytes 0.03 0.00 - 0.07 K/uL  Comprehensive metabolic panel     Status: Abnormal   Collection Time: 09/16/22  4:29 AM  Result Value Ref Range   Sodium 142 135 - 145 mmol/L   Potassium 4.2 3.5 - 5.1 mmol/L   Chloride 101 98 - 111 mmol/L   CO2 34 (H) 22 - 32 mmol/L   Glucose, Bld 169 (H) 70 - 99 mg/dL   BUN 15 6 - 20 mg/dL   Creatinine, Ser 0.50 0.44 - 1.00 mg/dL   Calcium 8.6 (L) 8.9 - 10.3 mg/dL   Total Protein 7.0 6.5 - 8.1 g/dL   Albumin 3.7 3.5 - 5.0 g/dL   AST 61 (H) 15 - 41 U/L   ALT 57 (H) 0 - 44 U/L   Alkaline Phosphatase 42 38 - 126 U/L   Total Bilirubin 0.6 0.3 - 1.2 mg/dL   GFR, Estimated >60 >60 mL/min   Anion gap 7 5 - 15  Procalcitonin - Baseline     Status: None   Collection Time: 09/16/22  4:29 AM  Result Value Ref Range   Procalcitonin <0.10 ng/mL  CBG monitoring, ED     Status: Abnormal   Collection Time: 09/16/22  7:39 AM  Result Value Ref Range   Glucose-Capillary 137 (H)  70 - 99 mg/dL    I have  Reviewed nursing notes, Vitals, and Lab results since pt's last encounter. Pertinent lab results : see above I have ordered labwork to follow up on.  I have reviewed the last note from staff over past 24 hours I have discussed pt's care plan and test results with nursing staff, CM/SW, and other staff as appropriate  Time spent: Greater than 50% of the 55 minute visit was spent in counseling/coordination of care for the patient as laid out in the A&P.   LOS: 0 days   Dwyane Dee, MD [facility] 09/16/2022, 11:56 AM

## 2022-09-17 ENCOUNTER — Telehealth: Payer: Self-pay | Admitting: Family Medicine

## 2022-09-17 LAB — CBC WITH DIFFERENTIAL/PLATELET
Abs Immature Granulocytes: 0.02 10*3/uL (ref 0.00–0.07)
Basophils Absolute: 0 10*3/uL (ref 0.0–0.1)
Basophils Relative: 0 %
Eosinophils Absolute: 0 10*3/uL (ref 0.0–0.5)
Eosinophils Relative: 0 %
HCT: 39.5 % (ref 36.0–46.0)
Hemoglobin: 12.3 g/dL (ref 12.0–15.0)
Immature Granulocytes: 0 %
Lymphocytes Relative: 36 %
Lymphs Abs: 2.4 10*3/uL (ref 0.7–4.0)
MCH: 27.5 pg (ref 26.0–34.0)
MCHC: 31.1 g/dL (ref 30.0–36.0)
MCV: 88.2 fL (ref 80.0–100.0)
Monocytes Absolute: 0.6 10*3/uL (ref 0.1–1.0)
Monocytes Relative: 9 %
Neutro Abs: 3.6 10*3/uL (ref 1.7–7.7)
Neutrophils Relative %: 55 %
Platelets: 166 10*3/uL (ref 150–400)
RBC: 4.48 MIL/uL (ref 3.87–5.11)
RDW: 14.3 % (ref 11.5–15.5)
WBC: 6.7 10*3/uL (ref 4.0–10.5)
nRBC: 0 % (ref 0.0–0.2)

## 2022-09-17 LAB — BASIC METABOLIC PANEL
Anion gap: 6 (ref 5–15)
BUN: 19 mg/dL (ref 6–20)
CO2: 32 mmol/L (ref 22–32)
Calcium: 7.9 mg/dL — ABNORMAL LOW (ref 8.9–10.3)
Chloride: 102 mmol/L (ref 98–111)
Creatinine, Ser: 0.47 mg/dL (ref 0.44–1.00)
GFR, Estimated: >60 mL/min (ref >60)
Glucose, Bld: 130 mg/dL — ABNORMAL HIGH (ref 70–99)
Potassium: 3.2 mmol/L — ABNORMAL LOW (ref 3.5–5.1)
Sodium: 140 mmol/L (ref 135–145)

## 2022-09-17 LAB — GLUCOSE, CAPILLARY
Glucose-Capillary: 127 mg/dL — ABNORMAL HIGH (ref 70–99)
Glucose-Capillary: 269 mg/dL — ABNORMAL HIGH (ref 70–99)

## 2022-09-17 LAB — PROCALCITONIN: Procalcitonin: <0.1 ng/mL

## 2022-09-17 LAB — MAGNESIUM: Magnesium: 2.2 mg/dL (ref 1.7–2.4)

## 2022-09-17 MED ORDER — PREDNISONE 10 MG PO TABS: 10.0000 mg | ORAL_TABLET | Freq: Every day | ORAL | Status: DC

## 2022-09-17 MED ORDER — PREDNISONE 20 MG PO TABS: 20.0000 mg | ORAL_TABLET | Freq: Every day | ORAL | 0 refills | Status: AC

## 2022-09-17 MED ORDER — PREDNISONE 10 MG PO TABS: 30.0000 mg | ORAL_TABLET | Freq: Every day | ORAL | 0 refills | Status: AC

## 2022-09-17 MED ORDER — GUAIFENESIN ER 600 MG PO TB12: 600.0000 mg | ORAL_TABLET | Freq: Two times a day (BID) | ORAL | 0 refills | Status: AC

## 2022-09-17 MED ORDER — OSELTAMIVIR PHOSPHATE 75 MG PO CAPS: 75.0000 mg | ORAL_CAPSULE | Freq: Two times a day (BID) | ORAL | 0 refills | Status: AC

## 2022-09-17 MED ORDER — PREDNISONE 20 MG PO TABS: 40.0000 mg | ORAL_TABLET | Freq: Every day | ORAL | 0 refills | Status: AC

## 2022-09-17 MED ORDER — PREDNISONE 20 MG PO TABS: 20.0000 mg | ORAL_TABLET | Freq: Every day | ORAL | Status: DC

## 2022-09-17 MED ORDER — PREDNISONE 10 MG PO TABS: 10.0000 mg | ORAL_TABLET | Freq: Every day | ORAL | 0 refills | Status: AC

## 2022-09-17 MED ORDER — PREDNISONE 20 MG PO TABS: 30.0000 mg | ORAL_TABLET | Freq: Every day | ORAL | Status: DC

## 2022-09-17 NOTE — Plan of Care (Signed)
progressing 

## 2022-09-17 NOTE — Plan of Care (Signed)
  Problem: Education: Goal: Ability to describe self-care measures that may prevent or decrease complications (Diabetes Survival Skills Education) will improve Outcome: Progressing   Problem: Coping: Goal: Ability to adjust to condition or change in health will improve Outcome: Progressing   Problem: Metabolic: Goal: Ability to maintain appropriate glucose levels will improve Outcome: Progressing   Problem: Nutritional: Goal: Maintenance of adequate nutrition will improve Outcome: Progressing   Problem: Skin Integrity: Goal: Risk for impaired skin integrity will decrease Outcome: Progressing   Problem: Tissue Perfusion: Goal: Adequacy of tissue perfusion will improve Outcome: Progressing   Problem: Clinical Measurements: Goal: Ability to maintain clinical measurements within normal limits will improve Outcome: Progressing Goal: Will remain free from infection Outcome: Progressing   Problem: Activity: Goal: Risk for activity intolerance will decrease Outcome: Progressing   Problem: Pain Managment: Goal: General experience of comfort will improve Outcome: Progressing

## 2022-09-17 NOTE — Telephone Encounter (Signed)
Hospital called and patient is being released today, 09/17/2022. She needs to be seen within a week. No available appointments.

## 2022-09-17 NOTE — Plan of Care (Signed)
Problem: Education: Goal: Ability to describe self-care measures that may prevent or decrease complications (Diabetes Survival Skills Education) will improve 09/17/2022 1215 by Clint Lipps, RN Outcome: Adequate for Discharge 09/17/2022 0951 by Clint Lipps, RN Outcome: Progressing Goal: Individualized Educational Video(s) 09/17/2022 1215 by Clint Lipps, RN Outcome: Adequate for Discharge 09/17/2022 0951 by Clint Lipps, RN Outcome: Progressing   Problem: Coping: Goal: Ability to adjust to condition or change in health will improve 09/17/2022 1215 by Clint Lipps, RN Outcome: Adequate for Discharge 09/17/2022 0951 by Clint Lipps, RN Outcome: Progressing   Problem: Fluid Volume: Goal: Ability to maintain a balanced intake and output will improve 09/17/2022 1215 by Clint Lipps, RN Outcome: Adequate for Discharge 09/17/2022 0951 by Clint Lipps, RN Outcome: Progressing   Problem: Health Behavior/Discharge Planning: Goal: Ability to identify and utilize available resources and services will improve 09/17/2022 1215 by Clint Lipps, RN Outcome: Adequate for Discharge 09/17/2022 0951 by Clint Lipps, RN Outcome: Progressing Goal: Ability to manage health-related needs will improve 09/17/2022 1215 by Clint Lipps, RN Outcome: Adequate for Discharge 09/17/2022 0951 by Clint Lipps, RN Outcome: Progressing   Problem: Metabolic: Goal: Ability to maintain appropriate glucose levels will improve 09/17/2022 1215 by Clint Lipps, RN Outcome: Adequate for Discharge 09/17/2022 0951 by Clint Lipps, RN Outcome: Progressing   Problem: Nutritional: Goal: Maintenance of adequate nutrition will improve 09/17/2022 1215 by Clint Lipps, RN Outcome: Adequate for Discharge 09/17/2022 0951 by Clint Lipps, RN Outcome: Progressing Goal: Progress toward achieving an optimal weight will improve 09/17/2022 1215 by Clint Lipps, RN Outcome: Adequate for  Discharge 09/17/2022 0951 by Clint Lipps, RN Outcome: Progressing   Problem: Skin Integrity: Goal: Risk for impaired skin integrity will decrease 09/17/2022 1215 by Clint Lipps, RN Outcome: Adequate for Discharge 09/17/2022 0951 by Clint Lipps, RN Outcome: Progressing   Problem: Tissue Perfusion: Goal: Adequacy of tissue perfusion will improve 09/17/2022 1215 by Clint Lipps, RN Outcome: Adequate for Discharge 09/17/2022 0951 by Clint Lipps, RN Outcome: Progressing   Problem: Education: Goal: Knowledge of General Education information will improve Description: Including pain rating scale, medication(s)/side effects and non-pharmacologic comfort measures 09/17/2022 1215 by Clint Lipps, RN Outcome: Adequate for Discharge 09/17/2022 0951 by Clint Lipps, RN Outcome: Progressing   Problem: Health Behavior/Discharge Planning: Goal: Ability to manage health-related needs will improve 09/17/2022 1215 by Clint Lipps, RN Outcome: Adequate for Discharge 09/17/2022 0951 by Clint Lipps, RN Outcome: Progressing   Problem: Clinical Measurements: Goal: Ability to maintain clinical measurements within normal limits will improve 09/17/2022 1215 by Clint Lipps, RN Outcome: Adequate for Discharge 09/17/2022 0951 by Clint Lipps, RN Outcome: Progressing Goal: Will remain free from infection 09/17/2022 1215 by Clint Lipps, RN Outcome: Adequate for Discharge 09/17/2022 0951 by Clint Lipps, RN Outcome: Progressing Goal: Diagnostic test results will improve 09/17/2022 1215 by Clint Lipps, RN Outcome: Adequate for Discharge 09/17/2022 0951 by Clint Lipps, RN Outcome: Progressing Goal: Respiratory complications will improve 09/17/2022 1215 by Clint Lipps, RN Outcome: Adequate for Discharge 09/17/2022 0951 by Clint Lipps, RN Outcome: Progressing Goal: Cardiovascular complication will be avoided 09/17/2022 1215 by Clint Lipps, RN Outcome:  Adequate for Discharge 09/17/2022 0951 by Clint Lipps, RN Outcome: Progressing   Problem: Activity: Goal: Risk for activity intolerance will decrease 09/17/2022 1215 by Clint Lipps, RN Outcome: Adequate for Discharge 09/17/2022 0951 by Clint Lipps, RN Outcome: Progressing   Problem: Nutrition: Goal: Adequate nutrition will be maintained 09/17/2022 1215 by Clint Lipps, RN Outcome: Adequate for Discharge 09/17/2022 0951 by Clint Lipps, RN Outcome: Progressing  Problem: Coping: Goal: Level of anxiety will decrease 09/17/2022 1215 by Clint Lipps, RN Outcome: Adequate for Discharge 09/17/2022 0951 by Clint Lipps, RN Outcome: Progressing   Problem: Elimination: Goal: Will not experience complications related to bowel motility 09/17/2022 1215 by Clint Lipps, RN Outcome: Adequate for Discharge 09/17/2022 0951 by Clint Lipps, RN Outcome: Progressing Goal: Will not experience complications related to urinary retention 09/17/2022 1215 by Clint Lipps, RN Outcome: Adequate for Discharge 09/17/2022 0951 by Clint Lipps, RN Outcome: Progressing   Problem: Pain Managment: Goal: General experience of comfort will improve 09/17/2022 1215 by Clint Lipps, RN Outcome: Adequate for Discharge 09/17/2022 0951 by Clint Lipps, RN Outcome: Progressing   Problem: Safety: Goal: Ability to remain free from injury will improve 09/17/2022 1215 by Clint Lipps, RN Outcome: Adequate for Discharge 09/17/2022 0951 by Clint Lipps, RN Outcome: Progressing   Problem: Skin Integrity: Goal: Risk for impaired skin integrity will decrease 09/17/2022 1215 by Clint Lipps, RN Outcome: Adequate for Discharge 09/17/2022 0951 by Clint Lipps, RN Outcome: Progressing

## 2022-09-17 NOTE — Discharge Summary (Signed)
Physician Discharge Summary  Martha Woodard HAL:[id number] DOB: 01-Oct-1965 DOA: 09/15/2022  PCP: Leone Haven, MD  Admit date: 09/15/2022 Discharge date: 09/17/2022  Admitted From: Home Disposition:  Home  Discharge Condition:Stable CODE STATUS:FULL Diet recommendation: Heart Healthy   Brief/Interim Summary: Ms. Travieso is a 56 yo female with PMH COPD on 1-2 L chronic O2, DMII, HTN, HLD, HFpEF who presented with SOB and cough. She also endorsed chills and body aches. She was found to have worsening hypoxia on admission and placed on 4L O2 as well. Workup was notable for Flu A and she was started on Tamiflu and steroids/nebs for COPD exacerbation.  Oxygen has been weaned down to 2 L/min.  She is already on oxygen at home on 2 L as needed.  She will be discharged on tapering dose of prednisone.  Following problems were addressed during hospitalization:  Influenza Patient presenting with 2-3 day history of cough, shortness of breath and bodyaches with PCR positive for influenza A.   - continue tamiflu to finish the course   COPD with acute exacerbation (HCC) - SOB, cough, dyspnea, increased O2 requirement - hold off on abx for now; PCT also negative x 2 -Continue prednisone tapering dose -Follows with pulmonology.  Using inhalers at home -On 2 L of oxygen at home as needed  Suspected obesity hypoventilation syndrome/OSA May benefit with sleep study as an outpatient   Essential hypertension - Continue losartan and toprol    DM type 2 (diabetes mellitus, type 2) ([place]) - continue SSI   Chronic diastolic heart failure ([place]) - continue lasix and toprol  - hold hctz, likely doesn't need both at discharge     Discharge Diagnoses:  Principal Problem:   Influenza Active Problems:   COPD with acute exacerbation ([place])   Obesity hypoventilation syndrome ([place])   Essential hypertension   DM type 2 (diabetes mellitus, type 2) ([place])   Chronic diastolic heart failure  ([place])    Discharge Instructions  Discharge Instructions     Diet - low sodium heart healthy   Complete by: As directed    Discharge instructions   Complete by: As directed    1)Please take prescribed medications as instructed 2)Follow up with your PCP in a week 3)Follow up with your pulmonologist as an outpatient   Increase activity slowly   Complete by: As directed       Allergies as of 09/17/2022       Reactions   Augmentin [amoxicillin-pot Clavulanate] Itching   Hives Can take PCN and Amoxicillin alone        Medication List     STOP taking these medications    hydrochlorothiazide 12.5 MG capsule Commonly known as: MICROZIDE       TAKE these medications    Accu-Chek Aviva Plus test strip Generic drug: glucose blood TEST UP TO 4 TIMES DAILY AS DIRECTED   Accu-Chek Softclix Lancets lancets TEST UP TO 4 TIMES DAILY   acetaminophen 650 MG CR tablet Commonly known as: TYLENOL Take 650 mg by mouth every 8 (eight) hours as needed for pain.   ascorbic acid 500 MG tablet Commonly known as: VITAMIN C Take 1,000 mg by mouth daily.   aspirin EC 81 MG tablet Take 1 tablet (81 mg total) by mouth daily. What changed: when to take this   Bevespi Aerosphere 9-4.8 MCG/ACT Aero Generic drug: Glycopyrrolate-Formoterol Inhale 2 puffs into the lungs 2 (two) times daily.   blood glucose meter kit and supplies Dispense based on  patient and insurance preference. Use up to four times daily as directed. (FOR ICD-9 250.00, 250.01).   calcium-vitamin D 500-200 MG-UNIT tablet Commonly known as: OSCAL WITH D Take 1 tablet by mouth 2 (two) times daily.   cetirizine 10 MG tablet Commonly known as: ZYRTEC Take 10 mg by mouth daily.   fluticasone 50 MCG/ACT nasal spray Commonly known as: FLONASE USE 1 OR 2 SPRAYS IN EACH NOSTRIL ONCE DAILY.   furosemide 20 MG tablet Commonly known as: LASIX Take 1 tablet (20 mg total) by mouth daily.   gabapentin 100 MG  capsule Commonly known as: NEURONTIN TAKE 1 CAPSULE BY MOUTH 3 TIMES  DAILY   guaiFENesin 600 MG 12 hr tablet Commonly known as: Mucinex Take 1 tablet (600 mg total) by mouth 2 (two) times daily for 7 days.   losartan 25 MG tablet Commonly known as: COZAAR TAKE 1 TABLET BY MOUTH TWICE  DAILY   magnesium oxide 400 MG tablet Commonly known as: MAG-OX Take 400 mg by mouth daily.   metFORMIN 500 MG tablet Commonly known as: GLUCOPHAGE TAKE 2 TABLETS BY MOUTH  TWICE DAILY WITH MEALS   metoprolol succinate 25 MG 24 hr tablet Commonly known as: TOPROL-XL TAKE 1 TABLET BY MOUTH ONCE  DAILY   montelukast 10 MG tablet Commonly known as: SINGULAIR TAKE 1 TABLET BY MOUTH AT  BEDTIME   multivitamin tablet Take 1 tablet by mouth daily.   oseltamivir 75 MG capsule Commonly known as: TAMIFLU Take 1 capsule (75 mg total) by mouth 2 (two) times daily for 3 days.   potassium chloride 10 MEQ tablet Commonly known as: KLOR-CON TAKE 1 TABLET BY MOUTH  TWICE DAILY   predniSONE 20 MG tablet Commonly known as: DELTASONE Take 2 tablets (40 mg total) by mouth daily with breakfast for 2 days. Start taking on: September 18, 2022   predniSONE 10 MG tablet Commonly known as: DELTASONE Take 3 tablets (30 mg total) by mouth daily with breakfast for 3 days. Start taking on: September 20, 2022   predniSONE 20 MG tablet Commonly known as: DELTASONE Take 1 tablet (20 mg total) by mouth daily with breakfast for 3 days. Start taking on: September 23, 2022   predniSONE 10 MG tablet Commonly known as: DELTASONE Take 1 tablet (10 mg total) by mouth daily with breakfast for 3 days. Start taking on: September 26, 2022   Proventil HFA 108 (90 Base) MCG/ACT inhaler Generic drug: albuterol Inhale 2 puffs into the lungs every 6 (six) hours as needed for wheezing or shortness of breath. 18 gram inhaler   albuterol (2.5 MG/3ML) 0.083% nebulizer solution Commonly known as: PROVENTIL Take 3 mLs (2.5 mg  total) by nebulization every 6 (six) hours as needed for wheezing or shortness of breath.   Pulmicort Flexhaler 180 MCG/ACT inhaler Generic drug: budesonide Inhale 1-2 puffs into the lungs 2 (two) times daily.   rosuvastatin 20 MG tablet Commonly known as: CRESTOR TAKE 1 TABLET BY MOUTH DAILY   sodium chloride 0.65 % Soln nasal spray Commonly known as: OCEAN Place 2 sprays into both nostrils as needed for congestion.   Vitamin E 180 MG Caps Take 180 mg by mouth daily.        Follow-up Information     Leone Haven, MD. Schedule an appointment as soon as possible for a visit in 1 week(s).   Specialty: Family Medicine Why: Office will call patient with an appointment. Contact information: [place] [address]  [phone]                Allergies  Allergen Reactions   Augmentin [Amoxicillin-Pot Clavulanate] Itching    Hives Can take PCN and Amoxicillin alone     Consultations: None   Procedures/Studies: DG Chest 2 View  Result Date: 09/15/2022 CLINICAL DATA:  Cough. EXAM: CHEST - 2 VIEW COMPARISON:  April 05, 2020 FINDINGS: Opacity in left base is stable since April 05, 2020, consistent with scar chronic atelectasis. Mild increased opacity in the right base. Coarsened lung markings diffusely. Bronchial cuffing identified. No pneumothorax. No nodules or masses. No change in the cardiomediastinal silhouette. Hyperinflation of the lungs with flattening of the diaphragm. IMPRESSION: 1. Hyperinflation of the lungs with flattening of the diaphragms suggesting COPD, emphysema, or asthma. 2. Bronchial cuffing and increased interstitial markings suggest bronchitic change. 3. Mild increased more focal opacity in the right base could represent atelectasis or early developing pneumonia. Recommend attention on follow-up and follow-up to resolution. 4. Chronic opacity in the left base is consistent with scar atelectasis. Electronically Signed   By:  Dorise Bullion III M.D.   On: 09/15/2022 10:57      Subjective: Patient seen and examined at bedside today.  Hemodynamically stable for discharge.  On 2 L of oxygen per minute.  Still having some mild bilateral expiratory wheezing and cough but she thinks that she is comfortable to go home today  Discharge Exam: Vitals:   09/17/22 0805 09/17/22 0821  BP: (!) 148/61   Pulse: 85   Resp: 20   Temp: 98 F (36.7 C)   SpO2: 93% 92%   Vitals:   09/17/22 0420 09/17/22 0740 09/17/22 0805 09/17/22 0821  BP: 116/60  (!) 148/61   Pulse: 77  85   Resp: 20  20   Temp: 98.3 F (36.8 C)  98 F (36.7 C)   TempSrc:   Oral   SpO2: 97% 98% 93% 92%  Weight:      Height:        General: Pt is alert, awake, not in acute distress,obese Cardiovascular: Diminished sounds bilaterally, mild bilateral expiratory wheezing Respiratory: CTA bilaterally, no wheezing, no rhonchi Abdominal: Soft, NT, ND, bowel sounds + Extremities: no edema, no cyanosis    The results of significant diagnostics from this hospitalization (including imaging, microbiology, ancillary and laboratory) are listed below for reference.     Microbiology: Recent Results (from the past 240 hour(s))  Resp panel by RT-PCR (RSV, Flu A&B, Covid) Anterior Nasal Swab     Status: Abnormal   Collection Time: 09/15/22 10:28 AM   Specimen: Anterior Nasal Swab  Result Value Ref Range Status   SARS Coronavirus 2 by RT PCR NEGATIVE NEGATIVE Final    Comment: (NOTE) SARS-CoV-2 target nucleic acids are NOT DETECTED.  The SARS-CoV-2 RNA is generally detectable in upper respiratory specimens during the acute phase of infection. The lowest concentration of SARS-CoV-2 viral copies this assay can detect is 138 copies/mL. A negative result does not preclude SARS-Cov-2 infection and should not be used as the sole basis for treatment or other patient management decisions. A negative result may occur with  improper specimen collection/handling,  submission of specimen other than nasopharyngeal swab, presence of viral mutation(s) within the areas targeted by this assay, and inadequate number of viral copies(<138 copies/mL). A negative result must be combined with clinical observations, patient history, and epidemiological information. The expected result is Negative.  Fact Sheet for Patients:  EntrepreneurPulse.com.au  Fact Sheet for  Healthcare Providers:  IncredibleEmployment.be  This test is no t yet approved or cleared by the [place] and  has been authorized for detection and/or diagnosis of SARS-CoV-2 by FDA under an Emergency Use Authorization (EUA). This EUA will remain  in effect (meaning this test can be used) for the duration of the COVID-19 declaration under Section 564(b)(1) of the Act, 21 U.S.C.section 360bbb-3(b)(1), unless the authorization is terminated  or revoked sooner.       Influenza A by PCR POSITIVE (A) NEGATIVE Final   Influenza B by PCR NEGATIVE NEGATIVE Final    Comment: (NOTE) The Xpert Xpress SARS-CoV-2/FLU/RSV plus assay is intended as an aid in the diagnosis of influenza from Nasopharyngeal swab specimens and should not be used as a sole basis for treatment. Nasal washings and aspirates are unacceptable for Xpert Xpress SARS-CoV-2/FLU/RSV testing.  Fact Sheet for Patients: EntrepreneurPulse.com.au  Fact Sheet for Healthcare Providers: IncredibleEmployment.be  This test is not yet approved or cleared by the [place] FDA and has been authorized for detection and/or diagnosis of SARS-CoV-2 by FDA under an Emergency Use Authorization (EUA). This EUA will remain in effect (meaning this test can be used) for the duration of the COVID-19 declaration under Section 564(b)(1) of the Act, 21 U.S.C. section 360bbb-3(b)(1), unless the authorization is terminated or revoked.     Resp Syncytial Virus by PCR  NEGATIVE NEGATIVE Final    Comment: (NOTE) Fact Sheet for Patients: EntrepreneurPulse.com.au  Fact Sheet for Healthcare Providers: IncredibleEmployment.be  This test is not yet approved or cleared by the [place] FDA and has been authorized for detection and/or diagnosis of SARS-CoV-2 by FDA under an Emergency Use Authorization (EUA). This EUA will remain in effect (meaning this test can be used) for the duration of the COVID-19 declaration under Section 564(b)(1) of the Act, 21 U.S.C. section 360bbb-3(b)(1), unless the authorization is terminated or revoked.  Performed at [facility], [address] [place], [place] [address]      Labs: BNP (last 3 results) No results for input(s): "BNP" in the last 8760 hours. Basic Metabolic Panel: Recent Labs  Lab 09/15/22 1028 09/16/22 0429 09/17/22 0713  NA 137 142 140  K 3.5 4.2 3.2*  CL 98 101 102  CO2 31 34* 32  GLUCOSE 170* 169* 130*  BUN _0 CREATININE 0.58 0.50 0.47  CALCIUM 8.7* 8.6* 7.9*  MG  --   --  2.2   Liver Function Tests: Recent Labs  Lab 09/15/22 1028 09/16/22 0429  AST 52* 61*  ALT 47* 57*  ALKPHOS 41 42  BILITOT 0.5 0.6  PROT 6.8 7.0  ALBUMIN 3.6 3.7   No results for input(s): "LIPASE", "AMYLASE" in the last 168 hours. No results for input(s): "AMMONIA" in the last 168 hours. CBC: Recent Labs  Lab 09/15/22 1028 09/16/22 0429 09/17/22 0713  WBC 5.2 4.9 6.7  NEUTROABS  --  3.6 3.6  HGB 12.9 12.9 12.3  HCT 41.0 41.6 39.5  MCV 86.7 88.3 88.2  PLT 158 182 166   Cardiac Enzymes: No results for input(s): "CKTOTAL", "CKMB", "CKMBINDEX", "TROPONINI" in the last 168 hours. BNP: Invalid input(s): "POCBNP" CBG: Recent Labs  Lab 09/16/22 1217 09/16/22 1704 09/16/22 2129 09/17/22 0759 09/17/22 1129  GLUCAP 197* 206* 167* 127* 269*   D-Dimer No results for input(s): "DDIMER" in the last 72 hours. Hgb A1c No results for input(s):  "HGBA1C" in the last 72 hours. Lipid Profile No results for input(s): "CHOL", "HDL", "  [place]", "TRIG", "CHOLHDL", "LDLDIRECT" in the last 72 hours. Thyroid function studies No results for input(s): "TSH", "T4TOTAL", "T3FREE", "THYROIDAB" in the last 72 hours.  Invalid input(s): "FREET3" Anemia work up No results for input(s): "VITAMINB12", "FOLATE", "FERRITIN", "TIBC", "IRON", "RETICCTPCT" in the last 72 hours. Urinalysis    Component Value Date/Time   COLORURINE STRAW (A) 09/30/2015 1114   APPEARANCEUR CLEAR (A) 09/30/2015 1114   LABSPEC 1.008 09/30/2015 1114   PHURINE 8.0 09/30/2015 1114   GLUCOSEU NEGATIVE 09/30/2015 1114   HGBUR NEGATIVE 09/30/2015 1114   BILIRUBINUR neg 06/05/2021 1002   KETONESUR NEGATIVE 09/30/2015 1114   PROTEINUR Negative 06/05/2021 1002   PROTEINUR NEGATIVE 09/30/2015 1114   UROBILINOGEN 0.2 06/05/2021 1002   NITRITE neg 06/05/2021 1002   NITRITE NEGATIVE 09/30/2015 1114   LEUKOCYTESUR Negative 06/05/2021 1002   Sepsis Labs Recent Labs  Lab 09/15/22 1028 09/16/22 0429 09/17/22 0713  WBC 5.2 4.9 6.7   Microbiology Recent Results (from the past 240 hour(s))  Resp panel by RT-PCR (RSV, Flu A&B, Covid) Anterior Nasal Swab     Status: Abnormal   Collection Time: 09/15/22 10:28 AM   Specimen: Anterior Nasal Swab  Result Value Ref Range Status   SARS Coronavirus 2 by RT PCR NEGATIVE NEGATIVE Final    Comment: (NOTE) SARS-CoV-2 target nucleic acids are NOT DETECTED.  The SARS-CoV-2 RNA is generally detectable in upper respiratory specimens during the acute phase of infection. The lowest concentration of SARS-CoV-2 viral copies this assay can detect is 138 copies/mL. A negative result does not preclude SARS-Cov-2 infection and should not be used as the sole basis for treatment or other patient management decisions. A negative result may occur with  improper specimen collection/handling, submission of specimen other than nasopharyngeal swab,  presence of viral mutation(s) within the areas targeted by this assay, and inadequate number of viral copies(<138 copies/mL). A negative result must be combined with clinical observations, patient history, and epidemiological information. The expected result is Negative.  Fact Sheet for Patients:  EntrepreneurPulse.com.au  Fact Sheet for Healthcare Providers:  IncredibleEmployment.be  This test is no t yet approved or cleared by the [place] FDA and  has been authorized for detection and/or diagnosis of SARS-CoV-2 by FDA under an Emergency Use Authorization (EUA). This EUA will remain  in effect (meaning this test can be used) for the duration of the COVID-19 declaration under Section 564(b)(1) of the Act, 21 U.S.C.section 360bbb-3(b)(1), unless the authorization is terminated  or revoked sooner.       Influenza A by PCR POSITIVE (A) NEGATIVE Final   Influenza B by PCR NEGATIVE NEGATIVE Final    Comment: (NOTE) The Xpert Xpress SARS-CoV-2/FLU/RSV plus assay is intended as an aid in the diagnosis of influenza from Nasopharyngeal swab specimens and should not be used as a sole basis for treatment. Nasal washings and aspirates are unacceptable for Xpert Xpress SARS-CoV-2/FLU/RSV testing.  Fact Sheet for Patients: EntrepreneurPulse.com.au  Fact Sheet for Healthcare Providers: IncredibleEmployment.be  This test is not yet approved or cleared by the [place] FDA and has been authorized for detection and/or diagnosis of SARS-CoV-2 by FDA under an Emergency Use Authorization (EUA). This EUA will remain in effect (meaning this test can be used) for the duration of the COVID-19 declaration under Section 564(b)(1) of the Act, 21 U.S.C. section 360bbb-3(b)(1), unless the authorization is terminated or revoked.     Resp Syncytial Virus by PCR NEGATIVE NEGATIVE Final    Comment: (NOTE) Fact Sheet for  Patients: EntrepreneurPulse.com.au  Fact Sheet for Healthcare Providers: IncredibleEmployment.be  This test is not yet approved or cleared by the [place] FDA and has been authorized for detection and/or diagnosis of SARS-CoV-2 by FDA under an Emergency Use Authorization (EUA). This EUA will remain in effect (meaning this test can be used) for the duration of the COVID-19 declaration under Section 564(b)(1) of the Act, 21 U.S.C. section 360bbb-3(b)(1), unless the authorization is terminated or revoked.  Performed at [facility], [address] [place], [place] [address]     Please note: You were cared for by a hospitalist during your hospital stay. Once you are discharged, your primary care physician will handle any further medical issues. Please note that NO REFILLS for any discharge medications will be authorized once you are discharged, as it is imperative that you return to your primary care physician (or establish a relationship with a primary care physician if you do not have one) for your post hospital discharge needs so that they can reassess your need for medications and monitor your lab values.    Time coordinating discharge: 40 minutes  SIGNED:   Shelly Coss, MD  [facility] 09/17/2022, 12:42 PM Pager [phone]  If 7PM-7AM, please contact night-coverage [email or web address] Password [email or web address]

## 2022-09-17 NOTE — TOC Initial Note (Signed)
Transition of Care [facility]) - Initial/Assessment Note    Patient Details  Name: Martha Woodard MRN: [id number] Date of Birth: 02-Dec-1965  Transition of Care [facility]) CM/SW Contact:    Beverly Sessions, RN Phone Number: 09/17/2022, 12:08 PM  Clinical Narrative:                     Transition of Care (TOC) Screening Note   Patient Details  Name: Martha Woodard Date of Birth: 02/16/1966   Transition of Care [facility]) CM/SW Contact:    Beverly Sessions, RN Phone Number: 09/17/2022, 12:08 PM    Transition of Care Department [facility]) has reviewed patient and no TOC needs have been identified at this time. We will continue to monitor patient advancement through interdisciplinary progression rounds. If new patient transition needs arise, please place a TOC consult.       Patient Goals and CMS Choice            Expected Discharge Plan and Services           Expected Discharge Date: 09/17/22                                    Prior Living Arrangements/Services                       Activities of Daily Living Home Assistive Devices/Equipment: None ADL Screening (condition at time of admission) Patient's cognitive ability adequate to safely complete daily activities?: Yes Is the patient deaf or have difficulty hearing?: No Does the patient have difficulty seeing, even when wearing glasses/contacts?: No Does the patient have difficulty concentrating, remembering, or making decisions?: No Patient able to express need for assistance with ADLs?: No Does the patient have difficulty dressing or bathing?: No Independently performs ADLs?: Yes (appropriate for developmental age) Does the patient have difficulty walking or climbing stairs?: No Weakness of Legs: None Weakness of Arms/Hands: None  Permission Sought/Granted                  Emotional Assessment              Admission diagnosis:  COPD with acute exacerbation ([place]) [J44.1] Influenza  [J11.1] Patient Active Problem List   Diagnosis Date Noted   Influenza 09/15/2022   Respiratory infection 08/27/2022   Submandibular gland swelling 04/07/2022   Lipoma 04/07/2022   Sinusitis 01/06/2022   Hot flashes 05/24/2021   Foot cramps 02/20/2021   De Quervain's tenosynovitis, left 11/23/2020   COPD with acute exacerbation ([place]) 10/23/2020   Papilloma of oral cavity 12/13/2018   Seborrheic dermatitis 10/02/2018   Light headedness 06/17/2018   Foot pain 06/17/2018   Allergic rhinitis 11/09/2017   Venous insufficiency 08/07/2017   Chronic respiratory failure ([place]) 11/13/2016   Skin cyst 08/19/2016   Thyroid nodule 11/28/2015   COPD (chronic obstructive pulmonary disease) with emphysema ([place]) 11/13/2015   Chronic diastolic heart failure ([place]) 11/13/2015   Morbid obesity with BMI of 45.0-49.9, adult ([place]) 10/01/2015   Obesity hypoventilation syndrome ([place]) 10/01/2015   Eczema 10/01/2015   Essential hypertension 10/01/2015   Palpitations 06/26/2014   DM type 2 (diabetes mellitus, type 2) ([place]) 06/26/2014   Hyperlipidemia 06/26/2014   Lung nodule seen on imaging study 02/06/2014   PCP:  Leone Haven, MD Pharmacy:   [facility] - [place], [place] [address]  [place] [address] Phone: [phone] Fax: [place], [place]. [place] [address] Phone: [phone] Fax: [place], [place]. [place] [address] Phone: [phone] Fax: [place], [place] [address] [place] [address] Phone: [phone] Fax: [phone]     Social Determinants of Health (SDOH) Social History: [place]: No Food Insecurity (09/16/2022)  Housing: Low Risk  (09/16/2022)  Transportation  Needs: No Transportation Needs (09/16/2022)  Utilities: Not At Risk (09/16/2022)  Depression (PHQ2-9): Low Risk  (08/27/2022)  Financial Resource Strain: Low Risk  (08/14/2022)  Physical Activity: Insufficiently Active (08/14/2022)  Social Connections: Socially Isolated (08/14/2022)  Stress: No Stress Concern Present (08/14/2022)  Tobacco Use: Medium Risk (09/16/2022)   SDOH Interventions:     Readmission Risk Interventions     No data to display

## 2022-09-17 NOTE — Telephone Encounter (Signed)
I called the patient and scheduled her for a hospital follow up.  Martha Woodard,cma

## 2022-09-18 ENCOUNTER — Telehealth: Payer: Self-pay | Admitting: *Deleted

## 2022-09-18 NOTE — Patient Outreach (Signed)
  Care Coordination [facility] Note Transition Care Management Unsuccessful Follow-up Telephone Call  Date of discharge and from where:  [facility] [phone]  Attempts:  1st Attempt  Reason for unsuccessful TCM follow-up call:  Left voice message  [place] Care Management [phone]

## 2022-09-19 ENCOUNTER — Other Ambulatory Visit: Payer: Self-pay | Admitting: Family Medicine

## 2022-09-19 ENCOUNTER — Telehealth: Payer: Self-pay

## 2022-09-19 DIAGNOSIS — E1159 Type 2 diabetes mellitus with other circulatory complications: Secondary | ICD-10-CM

## 2022-09-19 NOTE — Patient Outreach (Signed)
  Care Coordination TOC Note Transition Care Management Follow-up Telephone Call Date of discharge and from where: 09/17/22-[facility] Dx: "Influenza" How have you been since you were released from the hospital? Patient voices she is doing well. She is wearing oxygen 24/7. She has some SOB with exertion but managed. She reports occasional coughing but sputum is clear in color. She will finish Tamiflu dosage today. Appetite good. Blood sugars controlled -ranging in 130s with use of Prednisone.   Any questions or concerns? No  Items Reviewed: Did the pt receive and understand the discharge instructions provided? Yes  Medications obtained and verified? Yes  Other? Yes  Any new allergies since your discharge? No  Dietary orders reviewed? Yes Do you have support at home? Yes   Home Care and Equipment/Supplies: Were home health services ordered? not applicable If so, what is the name of the agency? N/A  Has the agency set up a time to come to the patient's home? not applicable Were any new equipment or medical supplies ordered?  No What is the name of the medical supply agency? N/A Were you able to get the supplies/equipment? not applicable Do you have any questions related to the use of the equipment or supplies? No  Functional Questionnaire: (I = Independent and D = Dependent) ADLs: I  Bathing/Dressing- I  Meal Prep- I  Eating- I  Maintaining continence- I  Transferring/Ambulation- I  Managing Meds- I  Follow up appointments reviewed:  PCP Hospital f/u appt confirmed? Yes  Scheduled to see Dr . Sonne[facility]1/02/[facility]u [facility]t confirmed? No  . Are transportation arrangements needed? No  If their condition worsens, is the pt aware to call PCP or go to the Emergency Dept.? Yes Was the patient provided with contact information for the PCP's office or ED? Yes Was to pt encouraged to call back with questions or concerns? Yes  SDOH assessments and  interventions completed:   Yes SDOH Interventions Today    Flowsheet Row Most Recent Value  SDOH Interventions   Food Insecurity Interventions Intervention Not Indicated  Transportation Interventions Intervention Not Indicated       Care Coordination Interventions:  Education provided    Encounter Outcome:  Pt. Visit Completed    Enzo Montgomery, RN,BSN,CCM [facility] Management Telephonic Care Management Coordinator Direct Phone: [phone] Toll Free: [phone] Fax: [phone]

## 2022-09-23 ENCOUNTER — Telehealth: Payer: Self-pay

## 2022-09-23 NOTE — Progress Notes (Signed)
Care Guide Note  09/23/2022 Name: JAYDENCE MONTANO MRN: [id number] DOB: 18-Jul-1966  Referred by: [name], MD Reason for referral : Care Coordination (Outreach to schedule with Pharm D )   ABBIGAL CURLEE is a 56 y.o. year old female who is a primary care patient of Birdie Sons, Yehuda Mao, MD. Lorenda Cahill Tool was referred to the pharmacist for assistance related to DM.    Successful contact was made with the patient to discuss pharmacy services including being ready for the pharmacist to call at least 5 minutes before the scheduled appointment time, to have medication bottles and any blood sugar or blood pressure readings ready for review. The patient agreed to meet with the pharmacist via with the pharmacist via telephone visit on (date/time).  10/14/2022  Penne Lash, RMA Care Guide [facility]  [place], [place] [address] Direct Dial: [phone] [email or web address]

## 2022-09-23 NOTE — Progress Notes (Signed)
Care Guide Note  09/23/2022 Name: Martha Woodard MRN: [id number] DOB: 1966/06/21  Referred by: [name], MD Reason for referral : Care Coordination (Outreach to schedule with Pharm D )   Martha Woodard is a 56 y.o. year old female who is a primary care patient of Birdie Sons, Yehuda Mao, MD. Martha Woodard was referred to the pharmacist for assistance related to DM.    An unsuccessful telephone outreach was attempted today to contact the patient who was referred to the pharmacy team for assistance with medication management. Additional attempts will be made to contact the patient.   Penne Lash, RMA Care Guide [facility]  [place], [place] [address] Direct Dial: [phone] [email or web address]

## 2022-09-30 ENCOUNTER — Encounter: Payer: Self-pay | Admitting: Family Medicine

## 2022-09-30 ENCOUNTER — Ambulatory Visit (INDEPENDENT_AMBULATORY_CARE_PROVIDER_SITE_OTHER): Payer: Medicare Other | Admitting: Family Medicine

## 2022-09-30 VITALS — BP 130/70 | HR 91 | Temp 98.9°F | Ht 66.0 in | Wt 248.6 lb

## 2022-09-30 DIAGNOSIS — I10 Essential (primary) hypertension: Secondary | ICD-10-CM | POA: Diagnosis not present

## 2022-09-30 DIAGNOSIS — J111 Influenza due to unidentified influenza virus with other respiratory manifestations: Secondary | ICD-10-CM

## 2022-09-30 DIAGNOSIS — J439 Emphysema, unspecified: Secondary | ICD-10-CM | POA: Diagnosis not present

## 2022-09-30 LAB — BASIC METABOLIC PANEL
BUN: 15 mg/dL (ref 6–23)
CO2: 34 mEq/L — ABNORMAL HIGH (ref 19–32)
Calcium: 11.1 mg/dL — ABNORMAL HIGH (ref 8.4–10.5)
Chloride: 98 mEq/L (ref 96–112)
Creatinine, Ser: 0.69 mg/dL (ref 0.40–1.20)
GFR: 95.91 mL/min (ref >60.00)
Glucose, Bld: 82 mg/dL (ref 70–99)
Potassium: 4.2 mEq/L (ref 3.5–5.1)
Sodium: 144 mEq/L (ref 135–145)

## 2022-09-30 NOTE — Assessment & Plan Note (Addendum)
Chronic issue.  Adequately controlled.  Patient will continue metoprolol 25 mg daily and losartan 25 mg twice daily.

## 2022-09-30 NOTE — Progress Notes (Signed)
[name], MD Phone: [phone]  Martha Woodard is a 57 y.o. female who presents today for follow-up.  COPD exacerbation/influenza: Patient presented to the hospital on 09/15/2022 for shortness of breath and cough.  She had chills and bodyaches.  She had worsening hypoxia and was placed on 4 L of oxygen.  She was found to have influenza A and was started on Tamiflu and steroids/nebulizers for COPD exacerbation.  She progressively improved and her oxygen was back at her home level of 2 L as needed.    Hypertension: Not checking.  She does note her blood pressure went low while in the hospital.  She continues on losartan and metoprolol.  No chest pain.  She did note some swelling in her ankles prior to hospitalization though that has resolved.  Social History   Tobacco Use  Smoking Status Former   Packs/day: 3.00   Years: 34.00   Total pack years: 102.00   Types: Cigarettes   Quit date: 07/10/2013   Years since quitting: 9.2  Smokeless Tobacco Never    Current Outpatient Medications on File Prior to Visit  Medication Sig Dispense Refill   ACCU-CHEK AVIVA PLUS test strip TEST UP TO 4 TIMES DAILY AS DIRECTED 400 strip 3   Accu-Chek Softclix Lancets lancets TEST UP TO 4 TIMES DAILY 400 each 3   acetaminophen (TYLENOL) 650 MG CR tablet Take 650 mg by mouth every 8 (eight) hours as needed for pain.     albuterol (PROVENTIL) (2.5 MG/3ML) 0.083% nebulizer solution Take 3 mLs (2.5 mg total) by nebulization every 6 (six) hours as needed for wheezing or shortness of breath. 75 mL 12   aspirin EC 81 MG tablet Take 1 tablet (81 mg total) by mouth daily. (Patient taking differently: Take 81 mg by mouth every evening.) 90 tablet 3   blood glucose meter kit and supplies Dispense based on patient and insurance preference. Use up to four times daily as directed. (FOR ICD-9 250.00, 250.01). 1 each 0   budesonide (PULMICORT FLEXHALER) 180 MCG/ACT inhaler Inhale 1-2 puffs into the lungs 2 (two)  times daily. 3 each 3   calcium-vitamin D (OSCAL WITH D) 500-200 MG-UNIT tablet Take 1 tablet by mouth 2 (two) times daily.     cetirizine (ZYRTEC) 10 MG tablet Take 10 mg by mouth daily.     fluticasone (FLONASE) 50 MCG/ACT nasal spray USE 1 OR 2 SPRAYS IN EACH NOSTRIL ONCE DAILY. 16 g 12   furosemide (LASIX) 20 MG tablet Take 1 tablet (20 mg total) by mouth daily. 90 tablet 3   gabapentin (NEURONTIN) 100 MG capsule TAKE 1 CAPSULE BY MOUTH 3 TIMES  DAILY 270 capsule 3   Glycopyrrolate-Formoterol (BEVESPI AEROSPHERE) 9-4.8 MCG/ACT AERO Inhale 2 puffs into the lungs 2 (two) times daily. 10.7 g 11   losartan (COZAAR) 25 MG tablet TAKE 1 TABLET BY MOUTH TWICE  DAILY 180 tablet 3   magnesium oxide (MAG-OX) 400 MG tablet Take 400 mg by mouth daily.     metFORMIN (GLUCOPHAGE) 500 MG tablet TAKE 2 TABLETS BY MOUTH  TWICE DAILY WITH MEALS 360 tablet 3   metoprolol succinate (TOPROL-XL) 25 MG 24 hr tablet TAKE 1 TABLET BY MOUTH ONCE  DAILY 90 tablet 3   montelukast (SINGULAIR) 10 MG tablet TAKE 1 TABLET BY MOUTH AT  BEDTIME 90 tablet 3   Multiple Vitamin (MULTIVITAMIN) tablet Take 1 tablet by mouth daily.     potassium chloride (KLOR-CON) 10 MEQ tablet TAKE 1 TABLET BY  MOUTH  TWICE DAILY 180 tablet 3   PROVENTIL HFA 108 (90 Base) MCG/ACT inhaler Inhale 2 puffs into the lungs every 6 (six) hours as needed for wheezing or shortness of breath. 18 gram inhaler 3 each 1   rosuvastatin (CRESTOR) 20 MG tablet TAKE 1 TABLET BY MOUTH DAILY 90 tablet 3   sodium chloride (OCEAN) 0.65 % SOLN nasal spray Place 2 sprays into both nostrils as needed for congestion. 30 mL 11   vitamin C (ASCORBIC ACID) 500 MG tablet Take 1,000 mg by mouth daily.      Vitamin E 180 MG CAPS Take 180 mg by mouth daily.     No current facility-administered medications on file prior to visit.     ROS see history of present illness  Objective  Physical Exam Vitals:   09/30/22 1107  BP: 130/70  Pulse: 91  Temp: 98.9 F (37.2 C)   SpO2: 93%    BP Readings from Last 3 Encounters:  09/30/22 130/70  09/17/22 (!) 148/61  08/27/22 122/74   Wt Readings from Last 3 Encounters:  09/30/22 248 lb 9.6 oz (112.8 kg)  09/15/22 256 lb (116.1 kg)  08/27/22 255 lb 9.6 oz (115.9 kg)    Physical Exam Constitutional:      General: She is not in acute distress.    Appearance: She is not diaphoretic.  Cardiovascular:     Rate and Rhythm: Normal rate and regular rhythm.     Heart sounds: Normal heart sounds.  Pulmonary:     Effort: Pulmonary effort is normal.     Breath sounds: Normal breath sounds.  Musculoskeletal:     Right lower leg: No edema.     Left lower leg: No edema.  Skin:    General: Skin is warm and dry.  Neurological:     Mental Status: She is alert.      Assessment/Plan: Please see individual problem list.  Influenza Assessment & Plan: Patient has recovered well.  She has completed Tamiflu.  She will monitor for any worsening symptoms and let us know if those occur.  Orders: -     Basic metabolic panel  Essential hypertension Assessment & Plan: Chronic issue.  Adequately controlled.  Patient will continue metoprolol 25 mg daily and losartan 25 mg twice daily.   Pulmonary emphysema, unspecified emphysema type ([place]) Assessment & Plan: Chronic issue with recent exacerbation.  She has progressively improved.  She will continue her Bevespi 2 puffs twice daily, Pulmicort 1 to 2 puffs into the lungs 2 times daily, and albuterol nebulizer as needed.      Return for as scheduled.   [name], MD [place]

## 2022-09-30 NOTE — Assessment & Plan Note (Signed)
Chronic issue with recent exacerbation.  She has progressively improved.  She will continue her Bevespi 2 puffs twice daily, Pulmicort 1 to 2 puffs into the lungs 2 times daily, and albuterol nebulizer as needed.

## 2022-09-30 NOTE — Assessment & Plan Note (Signed)
Patient has recovered well.  She has completed Tamiflu.  She will monitor for any worsening symptoms and let us know if those occur.

## 2022-09-30 NOTE — Patient Instructions (Signed)
Nice to see you. I am glad you are feeling better. If you have any worsening symptoms at any point please let us know.  If they severely worsen please seek medical attention.

## 2022-10-02 ENCOUNTER — Other Ambulatory Visit: Payer: Self-pay

## 2022-10-14 ENCOUNTER — Other Ambulatory Visit: Payer: Medicare Other | Admitting: Pharmacist

## 2022-10-14 MED ORDER — OZEMPIC (0.25 OR 0.5 MG/DOSE) 2 MG/3ML ~~LOC~~ SOPN: PEN_INJECTOR | SUBCUTANEOUS | 2 refills | Status: DC

## 2022-10-14 NOTE — Progress Notes (Signed)
THANKS! That sounds great when you get the complete application and you can fax to attention to Anette Riedel [phone]

## 2022-10-14 NOTE — Progress Notes (Signed)
10/14/2022 Name: Martha Woodard MRN: [id number] DOB: 17-Apr-1966  Chief Complaint  Patient presents with   Medication Management   Diabetes    Martha Woodard is a 57 y.o. year old female who presented for a telephone visit.   They were referred to the pharmacist by their PCP for assistance in managing diabetes and medication access.   Subjective:  Care Team: Primary Care Provider: Leone Haven, Woodard ; Next Scheduled Visit: 12/26/22  Medication Access/Adherence  Current Pharmacy:  [facility] ([place], [place] [facility] [place] [address] [address] Phone: [phone] Fax: [place], [place]. [place] [address] Phone: [phone] Fax: [place], [place]. [place] [address] Phone: [phone] Fax: [place], [place] [address] Phone: [phone] Fax: [phone]   Patient reports affordability concerns with their medications: Yes  Patient reports access/transportation concerns to their pharmacy: No  Patient reports adherence concerns with their medications:  No     Diabetes:  Current medications: metformin 1000 mg twice daily  Previously worked up thyroid nodule, no concerns with medullary thyroid cancer  Current glucose readings: fasting: 112-130s  Patient denies hypoglycemic s/sx including dizziness, shakiness, sweating. Patient denies hyperglycemic symptoms including polyuria, polydipsia, polyphagia, nocturia, neuropathy, blurred vision.  Current meal patterns:  - Breakfast: ~ 10 am 2 scrambled eggs, 2 pieces of toast;  - Supper: ~ 4 pm: spaghetti; hamburger; avoids salts - Snacks: apple, orange;   - Drinks: water; avoiding soda  Current medication access support:  requests need for Patient Assistance   COPD:  Current medications: Bevespi 2 puffs twice daily; pulmicort 1-2 puffs twice daily; both from [employer] patient assistance; reports she received notice that Pulmicort will not be available on [place] patient assistance moving forward - Unable to tolerate steroid in triple therapy previously  Reports she needs to call and reschedule her appointment with Dr. Ronalee Woodard.   Objective:  Lab Results  Component Value Date   HGBA1C 7.5 (H) 09/10/2022    Lab Results  Component Value Date   CREATININE 0.69 09/30/2022   BUN 15 09/30/2022   NA 144 09/30/2022   K 4.2 09/30/2022   CL 98 09/30/2022   CO2 34 (H) 09/30/2022    Lab Results  Component Value Date   CHOL 109 11/26/2021   HDL 45.90 11/26/2021   LDLCALC 40 11/26/2021   LDLDIRECT 57.0 07/27/2018   TRIG 114.0 11/26/2021   CHOLHDL 2 11/26/2021    Medications Reviewed Today     Reviewed by Martha Woodard (Pharmacist) on 10/14/22 at 1334  Med List Status: <None>   Medication Order Taking? Sig Documenting Provider Last Dose Status Informant  ACCU-CHEK AVIVA PLUS test strip [id number] Yes TEST UP TO 4 TIMES DAILY AS DIRECTED Martha Woodard Woodard, Woodard Taking Active Self  Accu-Chek Softclix Lancets lancets [id number] Yes TEST UP TO 4 TIMES DAILY Martha Haven, Woodard Taking Active Self  acetaminophen (TYLENOL) 650 MG CR tablet [id number] Yes Take 650 mg by mouth every 8 (eight) hours as needed for pain. Martha Woodard Taking Active Self           Med Note (Martha Woodard   Wed  Apr 24, 2021  2:03 PM)    albuterol (PROVENTIL) (2.5 MG/3ML) 0.083% nebulizer solution [id number] Yes Take 3 mLs (2.5 mg total) by nebulization every 6 (six) hours as needed for wheezing or shortness of breath. Martha Woodard, Martha Glow, Woodard Taking Active Self  aspirin EC 81 MG tablet [id number] Yes Take 1 tablet (81 mg total) by mouth daily. Martha Spikes, Martha Woodard Taking Active Self  blood glucose meter  kit and supplies [id number] Yes Dispense based on patient and insurance preference. Use up to four times daily as directed. (FOR ICD-9 250.00, 250.01). Martha Spikes, Martha Woodard Taking Active Self  budesonide (PULMICORT FLEXHALER) 180 MCG/ACT inhaler [id number] Yes Inhale 1-2 puffs into the lungs 2 (two) times daily. Martha Haven, Woodard Taking Active Self           Med Note Martha Woodard Oct 14, 2022  1:26 PM)    calcium-vitamin D (OSCAL WITH D) 500-200 MG-UNIT tablet [id number] Yes Take 1 tablet by mouth 2 (two) times daily. Martha Woodard Taking Active Self  cetirizine (ZYRTEC) 10 MG tablet [id number] Yes Take 10 mg by mouth daily. Martha Woodard Taking Active Self  fluticasone (FLONASE) 50 MCG/ACT nasal spray [id number] Yes USE 1 OR 2 SPRAYS IN EACH NOSTRIL ONCE DAILY. Martha Haven, Woodard Taking Active Self, Pharmacy Records  furosemide (LASIX) 20 MG tablet [id number] Yes Take 1 tablet (20 mg total) by mouth daily. Martha Merritts, Woodard Taking Active Self, Pharmacy Records  gabapentin (NEURONTIN) 100 MG capsule [id number] Yes TAKE 1 CAPSULE BY MOUTH 3 TIMES  DAILY Martha Woodard Taking Active Self, Pharmacy Records           Med Note [place], Martha Woodard   Tue Oct 14, 2022  1:31 PM) Twice daily  Glycopyrrolate-Formoterol (BEVESPI AEROSPHERE) 9-4.8 MCG/ACT Martha Woodard [id number] Yes Inhale 2 puffs into the lungs 2 (two) times daily. Martha Woodard Taking Active Self           Med Note Martha Woodard Oct 14, 2022  1:26 PM)    losartan (COZAAR) 25 MG tablet [id number] Yes TAKE 1 TABLET BY MOUTH TWICE  DAILY Martha Woodard Taking Active Self, Pharmacy Records  magnesium oxide (MAG-OX) 400 MG tablet [id number] Yes Take 400 mg by mouth daily. Martha Woodard Taking Active Self  metFORMIN (GLUCOPHAGE) 500 MG tablet [id number] Yes TAKE 2 TABLETS BY MOUTH  TWICE DAILY WITH MEALS Martha Haven, Woodard Taking Active Self, Pharmacy Records  metoprolol  succinate (TOPROL-XL) 25 MG 24 hr tablet [id number] Yes TAKE 1 TABLET BY MOUTH ONCE  DAILY Martha Woodard Taking Active Self, Pharmacy Records  montelukast (SINGULAIR) 10 MG tablet [id number] Yes TAKE 1 TABLET BY MOUTH AT  BEDTIME Martha Haven, Woodard Taking Active Self, Pharmacy Records  Multiple Vitamin (MULTIVITAMIN) tablet [id number] Yes Take 1 tablet by mouth daily. Martha Woodard Taking Active Self  potassium chloride (KLOR-CON) 10 MEQ tablet [id number] Yes TAKE 1 TABLET BY MOUTH  TWICE DAILY Martha Woodard Taking Active Self, Pharmacy Records           Med Note [place], Lacoya Wilbanks T   Tue Oct 14, 2022  1:32 PM) Taking once daily  PROVENTIL HFA 108 (90 Base) MCG/ACT inhaler [id number] Yes Inhale 2 puffs into the lungs every 6 (six) hours as needed for wheezing or shortness of breath. 18 gram inhaler Martha Woodard, Martha Glow, Woodard Taking Active Self  rosuvastatin (  CRESTOR) 20 MG tablet [id number] Yes TAKE 1 TABLET BY MOUTH DAILY Martha Woodard Taking Active Self, Pharmacy Records  sodium chloride (OCEAN) 0.65 % SOLN nasal spray [id number]  Place 2 sprays into both nostrils as needed for congestion. Martha Woodard, Martha Glow, Woodard  Active Self  vitamin C (ASCORBIC ACID) 500 MG tablet [id number] Yes Take 1,000 mg by mouth daily.  Martha Woodard Taking Active Self  Vitamin Woodard 180 MG CAPS [id number] Yes Take 180 mg by mouth daily. Martha Woodard Taking Active Self              Assessment/Plan:   Diabetes: - Currently uncontrolled - Reviewed long term cardiovascular and renal outcomes of uncontrolled blood sugar - Reviewed goal A1c, goal fasting, and goal 2 hour post prandial glucose - Reviewed dietary modifications including: focus on lean proteins, fruits and vegetables, whole grains - Recommend to continue metformin and start Ozempic 0.25 mg weekly for 4 weeks, then increase to 0.5 mg weekly. Will pursue patient assistance. Will collaborate with  Medication Access team.  - Counseled on Ozempic mechanism of action, side effects. No history of pancreatitis. Discussed to visit Ozempic website for videos of how to inject, and to call me if continued questions and we can schedule in person teaching - Recommend to check glucose daily, alternating between fasting and 2 hour post prandial.    COPD: - Currently controlled.  - Recommend to schedule follow up with Dr. Ronalee Woodard to discuss other therapy options with the ending of Pulmicort    Follow Up Plan: phone call in 8 weeks  Catie TJodi Woodard, PharmD, [place], [facility] [phone]

## 2022-10-15 ENCOUNTER — Telehealth: Payer: Self-pay | Admitting: Pharmacist

## 2022-10-15 NOTE — Telephone Encounter (Signed)
Medication Samples have been provided to the patient.  Drug name: Ozempic      Strength: '2mg'$ /71m        Qty: 1  LOT: [id number] Exp.Date: 03/28/2024  Dosing instructions: Inject 0.25 mg weekly for 4 weeks, then increase to 0.5 mg weekly   The patient has been instructed regarding the correct time, dose, and frequency of taking this medication, including desired effects and most common side effects.   LCannon Kettle12:50 PM 10/15/2022

## 2022-10-15 NOTE — Telephone Encounter (Signed)
Patient in for labs tomorrow. Please prepare sample of Ozempic 0.25/0.5 mg dose (red box).   Inject 0.25 mg weekly for 4 weeks, then increase to 0.5 mg weekly.   We will pursue patient assistance for 0.5 mg weekly dose.   Catie Hedwig Morton, PharmD, [place], [facility] [phone]

## 2022-10-16 ENCOUNTER — Other Ambulatory Visit (INDEPENDENT_AMBULATORY_CARE_PROVIDER_SITE_OTHER): Payer: Medicare Other

## 2022-10-16 LAB — BASIC METABOLIC PANEL
BUN: 10 mg/dL (ref 6–23)
CO2: 36 mEq/L — ABNORMAL HIGH (ref 19–32)
Calcium: 9.9 mg/dL (ref 8.4–10.5)
Chloride: 99 mEq/L (ref 96–112)
Creatinine, Ser: 0.62 mg/dL (ref 0.40–1.20)
GFR: 98.39 mL/min (ref >60.00)
Glucose, Bld: 187 mg/dL — ABNORMAL HIGH (ref 70–99)
Potassium: 4.2 mEq/L (ref 3.5–5.1)
Sodium: 143 mEq/L (ref 135–145)

## 2022-10-17 ENCOUNTER — Telehealth: Payer: Self-pay

## 2022-10-17 NOTE — Telephone Encounter (Signed)
Pt picked up medication at lab appt on 10/16/22. And instructions was given on how to use.

## 2022-10-17 NOTE — Telephone Encounter (Signed)
LMTCB

## 2022-10-17 NOTE — Telephone Encounter (Signed)
-----  Message from Leone Haven, MD sent at 10/16/2022  3:15 PM EST ----- Please let the patient know that her calcium level is acceptable.

## 2022-10-17 NOTE — Telephone Encounter (Signed)
Submitted application for OZEMPIC to [place] for patient assistance.   Phone: [phone]

## 2022-10-17 NOTE — Telephone Encounter (Signed)
Patient called office back and note was read to patient

## 2022-10-18 DIAGNOSIS — J449 Chronic obstructive pulmonary disease, unspecified: Secondary | ICD-10-CM | POA: Diagnosis not present

## 2022-10-21 ENCOUNTER — Other Ambulatory Visit: Payer: Self-pay | Admitting: Family

## 2022-10-23 ENCOUNTER — Telehealth: Payer: Self-pay | Admitting: Pulmonary Disease

## 2022-10-23 NOTE — Telephone Encounter (Signed)
Rx has been signed by Dr. Patsey Berthold and faxed to [facility].  Patient is aware and voiced her understanding.  Nothing further needed.

## 2022-10-23 NOTE — Telephone Encounter (Signed)
Patient called my phone wanted to get Rx for [facility] sent to [place] Fax # [phone]. When Rx is sent please call the patient

## 2022-10-29 NOTE — Telephone Encounter (Signed)
Received notification from [employer] regarding approval for [place]. Patient assistance approved from 10/28/2022 to 09/29/2023.  Phone: [phone]

## 2022-11-03 ENCOUNTER — Other Ambulatory Visit: Payer: Self-pay | Admitting: Pharmacist

## 2022-11-03 NOTE — Progress Notes (Signed)
Care Coordination Call  Patient contacted me noting that she received the letter that she was approved for Ozempic patient assistance. We reviewed that 4 month supply will ship to her. Discussed picking up as soon as possible after the clinic contacts her that it has arrived. Patient verbalizes understanding.   Catie Hedwig Morton, PharmD, [place], [facility] [phone]

## 2022-11-06 ENCOUNTER — Telehealth: Payer: Self-pay

## 2022-11-06 NOTE — Telephone Encounter (Signed)
I called the patient and informed her that her Ozempic has arrived and is available for pickup.  She understood.  Myna Freimark,cma

## 2022-11-06 NOTE — Telephone Encounter (Signed)
Pt came in to office and picked up medication Ozempic (5 boxes) from Patient Assistance @ 2pm on 11/06/22

## 2022-11-10 ENCOUNTER — Other Ambulatory Visit: Payer: Self-pay | Admitting: Cardiovascular Disease

## 2022-11-17 DIAGNOSIS — M955 Acquired deformity of pelvis: Secondary | ICD-10-CM | POA: Diagnosis not present

## 2022-11-17 DIAGNOSIS — M9905 Segmental and somatic dysfunction of pelvic region: Secondary | ICD-10-CM | POA: Diagnosis not present

## 2022-11-17 DIAGNOSIS — M9904 Segmental and somatic dysfunction of sacral region: Secondary | ICD-10-CM | POA: Diagnosis not present

## 2022-11-17 DIAGNOSIS — M5432 Sciatica, left side: Secondary | ICD-10-CM | POA: Diagnosis not present

## 2022-11-18 DIAGNOSIS — J449 Chronic obstructive pulmonary disease, unspecified: Secondary | ICD-10-CM | POA: Diagnosis not present

## 2022-11-20 ENCOUNTER — Encounter: Payer: Self-pay | Admitting: Pulmonary Disease

## 2022-11-20 ENCOUNTER — Ambulatory Visit: Payer: Medicare Other | Admitting: Pulmonary Disease

## 2022-11-20 VITALS — BP 138/84 | HR 99 | Temp 97.3°F | Ht 66.0 in | Wt 248.0 lb

## 2022-11-20 DIAGNOSIS — R918 Other nonspecific abnormal finding of lung field: Secondary | ICD-10-CM | POA: Diagnosis not present

## 2022-11-20 DIAGNOSIS — J4489 Other specified chronic obstructive pulmonary disease: Secondary | ICD-10-CM

## 2022-11-20 DIAGNOSIS — E662 Morbid (severe) obesity with alveolar hypoventilation: Secondary | ICD-10-CM

## 2022-11-20 DIAGNOSIS — J9612 Chronic respiratory failure with hypercapnia: Secondary | ICD-10-CM

## 2022-11-20 DIAGNOSIS — J9611 Chronic respiratory failure with hypoxia: Secondary | ICD-10-CM | POA: Diagnosis not present

## 2022-11-20 MED ORDER — QVAR REDIHALER 80 MCG/ACT IN AERB: 2.0000 | INHALATION_SPRAY | Freq: Two times a day (BID) | RESPIRATORY_TRACT | 11 refills | Status: DC

## 2022-11-20 NOTE — Progress Notes (Signed)
Subjective:    Patient ID: Martha Woodard, female    DOB: 1966-03-31, 57 y.o.   MRN: [id number] Patient Care Team: Leone Haven, MD as PCP - General (Family Medicine) Bary Castilla, Forest Gleason, MD (General Surgery) Minna Merritts, MD as Consulting Physician (Cardiology)  Chief Complaint  Patient presents with   Follow-up    SOB with exertion. Wheezing last night. Dry cough. ER for flu on 09/15/2022.   PROBLEMS: Chronic hypoxic/hypercarbic respiratory failure Former smoker Moderate/severe COPD [facility] CHF Multiple pulmonary nodules   DATA: 09/28/2015 2D echo: EF 60 to 65%, grade 1 DD, mild left atrial dilation.  Right-sided pressures normal. 02/28/2020 PFTs:FEV1 1.42L or 49%, FVC or 2.24L 60%, FEV1/FVC 63%, no bronchodilator response.  RV is 122% indicating air trapping.  Diffusion capacity normal.  Consistent with moderate to severe COPD. 04/05/2020 chest x-ray: No active disease.  Consistent with COPD, left base scarring/atelectasis. 01/21/2022 LDCT: Mild to moderate centrilobular emphysema.  Multiple bilateral pulmonary nodules of which the larger were present on 01/17/2016 exam.  Some new nodules from 2017 exam.  Coronary artery calcifications.  Lung RADS 3 short-term follow-up 6 months ordered 07/16/2022 LDCT chest: Mild paraseptal and centrilobular emphysema use bronchial wall thickening.  Numerous scattered solid pulmonary nodules, solitary new basilar right upper lobe pulmonary nodule measuring 4.8 mm.  Lung RADS 3 probably benign finding short-term follow-up in 6 months recommended   INTERVAL HISTORY: Last seen by me on 16 June 2022 at that time she was doing well and no major changes were instituted.  Admitted to [facility] for exacerbation on 18 December due to influenza A, discharged 20 December.  No issues since hospital discharge.  Needs alternative to Pulmicort Flexhaler as no longer made by [employer].  HPI Martha Woodard is a 57 year old former smoker who presents today for  follow-up asthma/COPD overlap syndrome. She is well controlled on Bevespi and Pulmicort Flexhaler.  She did not tolerate the Breztri combination due to flares of of thrush and has to discontinue the ICS component.  ICS component controls her cough and asthma symptoms.  We had to prescribe the ICS component separately as she can then adjust this according to her asthma symptoms.  Previously she also had issues with "sores" with Anoro Ellipta.  Her Pulmicort Flexhaler will no longer be manufactured by [employer] so she needs an alternative ICS.  She was admitted to [facility] between 18 December through 20 December due to influenza A and mild exacerbation.  She has had no sequela since then.  She has multiple lung nodules that were previously noted as far back as 2017 however there are some new ones from that last CT performed in 2017.  These will need to be followed up expectantly.  She has follow-up CT scan scheduled for April 2024.   She is compliant with oxygen therapy for chronic hypoxic/hypercarbic respiratory failure due to COPD and obesity with obesity hypoventilation.  She has not had any fevers, chills or sweats.  No purulent sputum production.  She feels that her dyspnea is well compensated.  Overall she feels well and looks well.  I independently reviewed the available imaging in particular, last LDCT from 16 July 2022 and recent chest x-ray of 15 September 2022.     Review of Systems A 10 point review of systems was performed and it is as noted above otherwise negative.  Patient Active Problem List   Diagnosis Date Noted   Influenza 09/15/2022   Respiratory infection 08/27/2022   Submandibular gland swelling  04/07/2022   Lipoma 04/07/2022   Sinusitis 01/06/2022   Hot flashes 05/24/2021   Foot cramps 02/20/2021   De Quervain's tenosynovitis, left 11/23/2020   COPD with acute exacerbation ([place]) 10/23/2020   Papilloma of oral cavity 12/13/2018   Seborrheic dermatitis 10/02/2018    Light headedness 06/17/2018   Foot pain 06/17/2018   Allergic rhinitis 11/09/2017   Venous insufficiency 08/07/2017   Chronic respiratory failure ([place]) 11/13/2016   Skin cyst 08/19/2016   Thyroid nodule 11/28/2015   COPD (chronic obstructive pulmonary disease) with emphysema ([place]) 11/13/2015   Chronic diastolic heart failure ([place]) 11/13/2015   Morbid obesity with BMI of 45.0-49.9, adult ([place]) 10/01/2015   Obesity hypoventilation syndrome ([place]) 10/01/2015   Eczema 10/01/2015   Essential hypertension 10/01/2015   Palpitations 06/26/2014   DM type 2 (diabetes mellitus, type 2) ([place]) 06/26/2014   Hyperlipidemia 06/26/2014   Lung nodule seen on imaging study 02/06/2014   Social History   Tobacco Use   Smoking status: Former    Packs/day: 3.00    Years: 34.00    Total pack years: 102.00    Types: Cigarettes    Quit date: 07/10/2013    Years since quitting: 9.3   Smokeless tobacco: Never  Substance Use Topics   Alcohol use: No    Alcohol/week: 0.0 standard drinks of alcohol   Allergies  Allergen Reactions   Augmentin [Amoxicillin-Pot Clavulanate] Itching    Hives Can take PCN and Amoxicillin alone    Current Meds  Medication Sig   ACCU-CHEK AVIVA PLUS test strip TEST UP TO 4 TIMES DAILY AS DIRECTED   Accu-Chek Softclix Lancets lancets TEST UP TO 4 TIMES DAILY   acetaminophen (TYLENOL) 650 MG CR tablet Take 650 mg by mouth every 8 (eight) hours as needed for pain.   albuterol (PROVENTIL) (2.5 MG/3ML) 0.083% nebulizer solution Take 3 mLs (2.5 mg total) by nebulization every 6 (six) hours as needed for wheezing or shortness of breath.   aspirin EC 81 MG tablet Take 1 tablet (81 mg total) by mouth daily.   blood glucose meter kit and supplies Dispense based on patient and insurance preference. Use up to four times daily as directed. (FOR ICD-9 250.00, 250.01).   budesonide (PULMICORT FLEXHALER) 180 MCG/ACT inhaler Inhale 1-2 puffs into the lungs 2 (two) times daily.    calcium-vitamin D (OSCAL WITH D) 500-200 MG-UNIT tablet Take 1 tablet by mouth 2 (two) times daily.   cetirizine (ZYRTEC) 10 MG tablet Take 10 mg by mouth daily.   fluticasone (FLONASE) 50 MCG/ACT nasal spray USE 1 OR 2 SPRAYS IN EACH NOSTRIL ONCE DAILY.   furosemide (LASIX) 20 MG tablet Take 1 tablet (20 mg total) by mouth daily.   gabapentin (NEURONTIN) 100 MG capsule TAKE 1 CAPSULE BY MOUTH 3 TIMES  DAILY   Glycopyrrolate-Formoterol (BEVESPI AEROSPHERE) 9-4.8 MCG/ACT AERO Inhale 2 puffs into the lungs 2 (two) times daily.   hydrochlorothiazide (MICROZIDE) 12.5 MG capsule Take 12.5 mg by mouth daily.   losartan (COZAAR) 25 MG tablet TAKE 1 TABLET BY MOUTH TWICE  DAILY   magnesium oxide (MAG-OX) 400 MG tablet Take 400 mg by mouth daily.   metFORMIN (GLUCOPHAGE) 500 MG tablet TAKE 2 TABLETS BY MOUTH  TWICE DAILY WITH MEALS   metoprolol succinate (TOPROL-XL) 25 MG 24 hr tablet TAKE 1 TABLET BY MOUTH ONCE  DAILY   montelukast (SINGULAIR) 10 MG tablet TAKE 1 TABLET BY MOUTH AT  BEDTIME   Multiple Vitamin (MULTIVITAMIN) tablet Take 1 tablet by  mouth daily.   potassium chloride (KLOR-CON) 10 MEQ tablet TAKE 1 TABLET BY MOUTH TWICE  DAILY   PROVENTIL HFA 108 (90 Base) MCG/ACT inhaler Inhale 2 puffs into the lungs every 6 (six) hours as needed for wheezing or shortness of breath. 18 gram inhaler   rosuvastatin (CRESTOR) 20 MG tablet TAKE 1 TABLET BY MOUTH DAILY   Semaglutide,0.25 or 0.5MG/DOS, (OZEMPIC, 0.25 OR 0.5 MG/DOSE,) 2 MG/3ML SOPN Inject 0.25 mg weekly for 4 weeks, then increase to 0.5 mg weekly   sodium chloride (OCEAN) 0.65 % SOLN nasal spray Place 2 sprays into both nostrils as needed for congestion.   vitamin C (ASCORBIC ACID) 500 MG tablet Take 1,000 mg by mouth daily.    Vitamin E 180 MG CAPS Take 180 mg by mouth daily.   Immunization History  Administered Date(s) Administered   Influenza,inj,Quad PF,6+ Mos 07/02/2015, 08/19/2016, 08/07/2017, 06/16/2018, 06/29/2019, 07/31/2020,  07/05/2021, 09/10/2022   Moderna SARS-COV2 Booster Vaccination 11/05/2020   Moderna Sars-Covid-2 Vaccination 03/08/2020, 04/05/2020   Pneumococcal Polysaccharide-23 09/26/2015   Pneumococcal-Unspecified 11/23/2020       Objective:   Physical Exam BP 138/84 (BP Location: Left Wrist, Cuff Size: Normal)   Pulse 99   Temp (!) 97.3 F (36.3 C)   Ht 5' 6"$  (1.676 m)   Wt 248 lb (112.5 kg)   LMP 04/10/2014   SpO2 92%   BMI 40.03 kg/m   SpO2: 92 % O2 Device: Nasal cannula O2 Flow Rate (L/min): 1 L/min O2 Type: Continuous O2  GENERAL: Obese woman, no acute distress, comfortable with nasal cannula O2.  Ambulatory with assistance of cane. HEAD: Normocephalic, atraumatic. EYES: Pupils equal, round, reactive to light.  No scleral icterus. MOUTH: Dentition intact, oral mucosa moist, no thrush. NECK: Supple. No thyromegaly. Trachea midline. No JVD.  No adenopathy. PULMONARY: Mildly diminished breath sounds.  Coarse, otherwise no adventitious sounds.   CARDIOVASCULAR: S1 and S2. Regular rate and rhythm.  No rubs, murmurs or gallops heard. ABDOMEN: Obese, otherwise benign. MUSCULOSKELETAL: No joint deformity, no clubbing, no edema. NEUROLOGIC: No focal deficit, no gait disturbance, speech is fluent. SKIN: Intact,warm,dry. PSYCH: Mood and behavior normal.      Assessment & Plan:     ICD-10-CM   1. Asthma-COPD overlap syndrome  J44.89    Continue Bevespi 2 puffs twice a day Switch Pulmicort to Qvar 80, 2 puffs twice a day Continue as needed albuterol    2. Chronic respiratory failure with hypoxia and hypercapnia (HCC)  J96.11    J96.12    Patient compliant with oxygen at 1 L/min with rest and sleep 2 L/min with exertion    3. Multiple lung nodules on CT  R91.8    Has follow-up CT April 2024    4. Obesity hypoventilation syndrome (HCC)  E66.2    This issue adds complexity to her management Weight loss recommended     Meds ordered this encounter  Medications   beclomethasone  (QVAR REDIHALER) 80 MCG/ACT inhaler    Sig: Inhale 2 puffs into the lungs 2 (two) times daily.    Dispense:  10.6 g    Refill:  11   Will see her in follow-up in 3 to 4 months time she is to contact us prior to that time should any new problems arise.  Renold Don, MD Advanced Bronchoscopy PCCM [facility]    *This note was dictated using voice recognition software/Dragon.  Despite best efforts to proofread, errors can occur which can change the meaning. Any transcriptional  errors that result from this process are unintentional and may not be fully corrected at the time of dictation.

## 2022-11-20 NOTE — Patient Instructions (Addendum)
Continue your Bevespi 2 puffs twice a day  We will change her Pulmicort to Qvar 80, 2 inhalations twice a day.  Make sure you rinse your mouth well after you use it.  You will have your follow-up CT scan with the lung cancer screening in April please keep that appointment they will call you closer to the time of the appointment.  We will see you in follow-up in 3 to 4 months time call sooner should any new problems arise.

## 2022-11-24 DIAGNOSIS — M9905 Segmental and somatic dysfunction of pelvic region: Secondary | ICD-10-CM | POA: Diagnosis not present

## 2022-11-24 DIAGNOSIS — M9904 Segmental and somatic dysfunction of sacral region: Secondary | ICD-10-CM | POA: Diagnosis not present

## 2022-11-24 DIAGNOSIS — M955 Acquired deformity of pelvis: Secondary | ICD-10-CM | POA: Diagnosis not present

## 2022-11-24 DIAGNOSIS — M5432 Sciatica, left side: Secondary | ICD-10-CM | POA: Diagnosis not present

## 2022-11-27 ENCOUNTER — Ambulatory Visit (INDEPENDENT_AMBULATORY_CARE_PROVIDER_SITE_OTHER): Payer: Medicare Other | Admitting: Nurse Practitioner

## 2022-11-27 ENCOUNTER — Encounter: Payer: Self-pay | Admitting: Nurse Practitioner

## 2022-11-27 VITALS — BP 140/82 | HR 93 | Temp 98.8°F | Ht 66.0 in | Wt 247.6 lb

## 2022-11-27 DIAGNOSIS — J309 Allergic rhinitis, unspecified: Secondary | ICD-10-CM

## 2022-11-27 DIAGNOSIS — R110 Nausea: Secondary | ICD-10-CM

## 2022-11-27 DIAGNOSIS — J0140 Acute pansinusitis, unspecified: Secondary | ICD-10-CM

## 2022-11-27 MED ORDER — HYDROCOD POLI-CHLORPHE POLI ER 10-8 MG/5ML PO SUER: 5.0000 mL | Freq: Every evening | ORAL | 0 refills | Status: DC | PRN

## 2022-11-27 MED ORDER — ONDANSETRON HCL 4 MG PO TABS: 4.0000 mg | ORAL_TABLET | Freq: Three times a day (TID) | ORAL | 0 refills | Status: DC | PRN

## 2022-11-27 MED ORDER — PREDNISONE 10 MG PO TABS: ORAL_TABLET | ORAL | 0 refills | Status: DC

## 2022-11-27 MED ORDER — DOXYCYCLINE HYCLATE 100 MG PO TABS: 100.0000 mg | ORAL_TABLET | Freq: Two times a day (BID) | ORAL | 0 refills | Status: DC

## 2022-11-27 MED ORDER — FLUTICASONE PROPIONATE 50 MCG/ACT NA SUSP: NASAL | 12 refills | Status: DC

## 2022-11-27 NOTE — Patient Instructions (Signed)
Started on doxycycline and prednisone tapering and Tussionex. Increase fluid intake. Continue antihistamine and Flonase nasal spray. If symptoms does not improve please call the office for further evaluation

## 2022-11-27 NOTE — Progress Notes (Signed)
Established Patient Office Visit  Subjective:  Patient ID: Martha Woodard, female    DOB: Aug 29, 1966  Age: 57 y.o. MRN: [id number]  CC:  Chief Complaint  Patient presents with   Acute Visit    Sinus pressure, coughing, sneezing & post nasal drip x 2 days     HPI  Martha Woodard presents for:  Sinus Problem This is a new problem. The current episode started in the past 7 days. The problem is unchanged. There has been no fever. Associated symptoms include coughing, sneezing and a sore throat.     Past Medical History:  Diagnosis Date   CHF (congestive heart failure) ([place])    Colon polyp 01-01-2016   COPD (chronic obstructive pulmonary disease) (HCC)    Diabetes mellitus without complication (HCC)    Diastolic heart failure (HCC)    HLD (hyperlipidemia)    Hypertension    Morbid obesity with BMI of 45.0-49.9, adult ([place])    Multiple lung nodules on CT    Shortness of breath dyspnea     Past Surgical History:  Procedure Laterality Date   BIOPSY THYROID Right 02-06-14   NEGATIVE FOR MALIGNANT CELLS proteinaceous material and macrophages.   BREAST BIOPSY Left    neg- core   COLONOSCOPY WITH PROPOFOL N/A 01/01/2016   Procedure: COLONOSCOPY WITH PROPOFOL;  Surgeon: Lucilla Lame, MD;  Location: [facility] ENDOSCOPY;  Service: Endoscopy;  Laterality: N/A;   thryoid fna Right April 2015   Proteinaceous material and macrophages.   WISDOM TOOTH EXTRACTION      Family History  Problem Relation Age of Onset   Cancer Mother 12       uterian and ovarian    Goiter Mother    Stroke Father    Breast cancer Sister 67   Stroke Brother    Breast cancer Sister 82   Heart murmur Sister     Social History   Socioeconomic History   Marital status: Single    Spouse name: Not on file   Number of children: Not on file   Years of education: Not on file   Highest education level: Not on file  Occupational History   Not on file  Tobacco Use   Smoking status: Former    Packs/day: 3.00     Years: 34.00    Total pack years: 102.00    Types: Cigarettes    Quit date: 07/10/2013    Years since quitting: 9.4   Smokeless tobacco: Never  Vaping Use   Vaping Use: Former  Substance and Sexual Activity   Alcohol use: No    Alcohol/week: 0.0 standard drinks of alcohol   Drug use: No   Sexual activity: Not Currently  Other Topics Concern   Not on file  Social History Narrative   Not on file   Social Determinants of Health   Financial Resource Strain: Low Risk  (08/14/2022)   Overall Financial Resource Strain (CARDIA)    Difficulty of Paying Living Expenses: Not very hard  Food Insecurity: No Food Insecurity (09/19/2022)   Hunger Vital Sign    Worried About Running Out of Food in the Last Year: Never true    Ran Out of Food in the Last Year: Never true  Transportation Needs: No Transportation Needs (09/19/2022)   PRAPARE - Hydrologist (Medical): No    Lack of Transportation (Non-Medical): No  Physical Activity: Insufficiently Active (08/14/2022)   Exercise Vital Sign    Days of Exercise  per Week: 7 days    Minutes of Exercise per Session: 20 min  Stress: No Stress Concern Present (08/14/2022)   [place]    Feeling of Stress : Not at all  Social Connections: Socially Isolated (08/14/2022)   Social Connection and Isolation Panel [NHANES]    Frequency of Communication with Friends and Family: More than three times a week    Frequency of Social Gatherings with Friends and Family: Twice a week    Attends Religious Services: Never    Marine scientist or Organizations: No    Attends Archivist Meetings: Never    Marital Status: Divorced  Human resources officer Violence: Not At Risk (09/16/2022)   Humiliation, Afraid, Rape, and Kick questionnaire    Fear of Current or Ex-Partner: No    Emotionally Abused: No    Physically Abused: No    Sexually Abused: No      Outpatient Medications Prior to Visit  Medication Sig Dispense Refill   ACCU-CHEK AVIVA PLUS test strip TEST UP TO 4 TIMES DAILY AS DIRECTED 400 strip 3   Accu-Chek Softclix Lancets lancets TEST UP TO 4 TIMES DAILY 400 each 3   acetaminophen (TYLENOL) 650 MG CR tablet Take 650 mg by mouth every 8 (eight) hours as needed for pain.     albuterol (PROVENTIL) (2.5 MG/3ML) 0.083% nebulizer solution Take 3 mLs (2.5 mg total) by nebulization every 6 (six) hours as needed for wheezing or shortness of breath. 75 mL 12   aspirin EC 81 MG tablet Take 1 tablet (81 mg total) by mouth daily. 90 tablet 3   beclomethasone (QVAR REDIHALER) 80 MCG/ACT inhaler Inhale 2 puffs into the lungs 2 (two) times daily. 10.6 g 11   blood glucose meter kit and supplies Dispense based on patient and insurance preference. Use up to four times daily as directed. (FOR ICD-9 250.00, 250.01). 1 each 0   calcium-vitamin D (OSCAL WITH D) 500-200 MG-UNIT tablet Take 1 tablet by mouth 2 (two) times daily.     cetirizine (ZYRTEC) 10 MG tablet Take 10 mg by mouth daily.     furosemide (LASIX) 20 MG tablet Take 1 tablet (20 mg total) by mouth daily. 90 tablet 3   gabapentin (NEURONTIN) 100 MG capsule TAKE 1 CAPSULE BY MOUTH 3 TIMES  DAILY 270 capsule 3   Glycopyrrolate-Formoterol (BEVESPI AEROSPHERE) 9-4.8 MCG/ACT AERO Inhale 2 puffs into the lungs 2 (two) times daily. 10.7 g 11   hydrochlorothiazide (MICROZIDE) 12.5 MG capsule Take 12.5 mg by mouth daily.     losartan (COZAAR) 25 MG tablet TAKE 1 TABLET BY MOUTH TWICE  DAILY 180 tablet 3   magnesium oxide (MAG-OX) 400 MG tablet Take 400 mg by mouth daily.     metFORMIN (GLUCOPHAGE) 500 MG tablet TAKE 2 TABLETS BY MOUTH  TWICE DAILY WITH MEALS 360 tablet 3   metoprolol succinate (TOPROL-XL) 25 MG 24 hr tablet TAKE 1 TABLET BY MOUTH ONCE  DAILY 90 tablet 3   montelukast (SINGULAIR) 10 MG tablet TAKE 1 TABLET BY MOUTH AT  BEDTIME 90 tablet 3   Multiple Vitamin (MULTIVITAMIN) tablet  Take 1 tablet by mouth daily.     potassium chloride (KLOR-CON) 10 MEQ tablet TAKE 1 TABLET BY MOUTH TWICE  DAILY 180 tablet 0   PROVENTIL HFA 108 (90 Base) MCG/ACT inhaler Inhale 2 puffs into the lungs every 6 (six) hours as needed for wheezing or shortness of breath. 18 gram  inhaler 3 each 1   rosuvastatin (CRESTOR) 20 MG tablet TAKE 1 TABLET BY MOUTH DAILY 90 tablet 3   Semaglutide,0.25 or 0.'5MG'$ /DOS, (OZEMPIC, 0.25 OR 0.5 MG/DOSE,) 2 MG/3ML SOPN Inject 0.25 mg weekly for 4 weeks, then increase to 0.5 mg weekly 3 mL 2   sodium chloride (OCEAN) 0.65 % SOLN nasal spray Place 2 sprays into both nostrils as needed for congestion. 30 mL 11   vitamin C (ASCORBIC ACID) 500 MG tablet Take 1,000 mg by mouth daily.      Vitamin E 180 MG CAPS Take 180 mg by mouth daily.     fluticasone (FLONASE) 50 MCG/ACT nasal spray USE 1 OR 2 SPRAYS IN EACH NOSTRIL ONCE DAILY. 16 g 12   No facility-administered medications prior to visit.    Allergies  Allergen Reactions   Augmentin [Amoxicillin-Pot Clavulanate] Itching    Hives Can take PCN and Amoxicillin alone     ROS Review of Systems  Constitutional: Negative.   HENT:  Positive for postnasal drip, sneezing and sore throat.   Respiratory:  Positive for cough.   Cardiovascular:  Negative for leg swelling.  Gastrointestinal:  Positive for nausea.  Genitourinary: Negative.   Musculoskeletal: Negative.   Neurological: Negative.   Psychiatric/Behavioral: Negative.        Objective:    Physical Exam Constitutional:      Appearance: Normal appearance.  HENT:     Right Ear: Tympanic membrane normal.     Left Ear: Tympanic membrane normal.     Nose:     Right Sinus: Maxillary sinus tenderness and frontal sinus tenderness present.     Left Sinus: Maxillary sinus tenderness and frontal sinus tenderness present.     Mouth/Throat:     Mouth: Mucous membranes are moist.     Pharynx: Posterior oropharyngeal erythema present.  Cardiovascular:     Rate  and Rhythm: Normal rate and regular rhythm.     Pulses: Normal pulses.     Heart sounds: Normal heart sounds.  Pulmonary:     Effort: Pulmonary effort is normal.     Breath sounds: No wheezing.  Neurological:     General: No focal deficit present.     Mental Status: She is alert and oriented to person, place, and time. Mental status is at baseline.  Psychiatric:        Mood and Affect: Mood normal.        Behavior: Behavior normal.        Thought Content: Thought content normal.        Judgment: Judgment normal.     BP (!) 140/82   Pulse 93   Temp 98.8 F (37.1 C)   Ht '5\' 6"'$  (1.676 m)   Wt 247 lb 9.6 oz (112.3 kg)   LMP 04/10/2014   SpO2 94%   BMI 39.96 kg/m  Wt Readings from Last 3 Encounters:  11/27/22 247 lb 9.6 oz (112.3 kg)  11/20/22 248 lb (112.5 kg)  09/30/22 248 lb 9.6 oz (112.8 kg)     Health Maintenance  Topic Date Due   DTaP/Tdap/Td (1 - Tdap) Never done   Zoster Vaccines- Shingrix (1 of 2) Never done   COVID-19 Vaccine (3 - Moderna risk series) 12/12/2022 (Originally 12/03/2020)   FOOT EXAM  01/07/2023   OPHTHALMOLOGY EXAM  02/05/2023   HEMOGLOBIN A1C  03/12/2023   Diabetic kidney evaluation - Urine ACR  04/08/2023   Lung Cancer Screening  07/17/2023   Medicare Annual Wellness (AWV)  08/15/2023   Diabetic kidney evaluation - eGFR measurement  10/17/2023   MAMMOGRAM  04/30/2024   COLONOSCOPY (Pts 45-82yr Insurance coverage will need to be confirmed)  12/31/2025   PAP SMEAR-Modifier  02/05/2026   INFLUENZA VACCINE  Completed   Hepatitis C Screening  Completed   HIV Screening  Completed   HPV VACCINES  Aged Out    There are no preventive care reminders to display for this patient.  Lab Results  Component Value Date   TSH 0.90 05/24/2021   Lab Results  Component Value Date   WBC 6.7 09/17/2022   HGB 12.3 09/17/2022   HCT 39.5 09/17/2022   MCV 88.2 09/17/2022   PLT 166 09/17/2022   Lab Results  Component Value Date   NA 143 10/16/2022   K  4.2 10/16/2022   CO2 36 (H) 10/16/2022   GLUCOSE 187 (H) 10/16/2022   BUN 10 10/16/2022   CREATININE 0.62 10/16/2022   BILITOT 0.6 09/16/2022   ALKPHOS 42 09/16/2022   AST 61 (H) 09/16/2022   ALT 57 (H) 09/16/2022   PROT 7.0 09/16/2022   ALBUMIN 3.7 09/16/2022   CALCIUM 9.9 10/16/2022   ANIONGAP 6 09/17/2022   GFR 98.39 10/16/2022   Lab Results  Component Value Date   CHOL 109 11/26/2021   Lab Results  Component Value Date   HDL 45.90 11/26/2021   Lab Results  Component Value Date   LDLCALC 40 11/26/2021   Lab Results  Component Value Date   TRIG 114.0 11/26/2021   Lab Results  Component Value Date   CHOLHDL 2 11/26/2021   Lab Results  Component Value Date   HGBA1C 7.5 (H) 09/10/2022      Assessment & Plan:   Problem List Items Addressed This Visit       Respiratory   Sinusitis - Primary    Started on doxycycline, Tussionex and prednisone tapering. Advised to continue antihistamine and Flonase nasal spray.   Increase fluid intake      Relevant Medications   fluticasone (FLONASE) 50 MCG/ACT nasal spray   doxycycline (VIBRA-TABS) 100 MG tablet   predniSONE (DELTASONE) 10 MG tablet   chlorpheniramine-HYDROcodone (TUSSIONEX) 10-8 MG/5ML   Other Visit Diagnoses     Nausea       Relevant Medications   ondansetron (ZOFRAN) 4 MG tablet        Meds ordered this encounter  Medications   fluticasone (FLONASE) 50 MCG/ACT nasal spray    Sig: USE 1 OR 2 SPRAYS IN EACH NOSTRIL ONCE DAILY.    Dispense:  16 g    Refill:  12   doxycycline (VIBRA-TABS) 100 MG tablet    Sig: Take 1 tablet (100 mg total) by mouth 2 (two) times daily for 7 days.    Dispense:  14 tablet    Refill:  0   predniSONE (DELTASONE) 10 MG tablet    Sig: Take 6 tablets ( total 60 mg) PO for 1 day, take 5 tablets ( total 50 mg) PO for 1 day; take 4 tablets ( total 40 mg) PO for 1 day; take 3 tablets ( total 30 mg) PO  for 1 day, take 2 tablets ( total '20mg'$ ) PO for 1 day, take 1 tablet  PO x one day.    Dispense:  21 tablet    Refill:  0   DISCONTD: chlorpheniramine-HYDROcodone (TUSSIONEX) 10-8 MG/5ML    Sig: Take 5 mLs by mouth at bedtime as needed for cough.    Dispense:  70 mL    Refill:  0   DISCONTD: chlorpheniramine-HYDROcodone (TUSSIONEX) 10-8 MG/5ML    Sig: Take 5 mLs by mouth at bedtime as needed for cough.    Dispense:  70 mL    Refill:  0   chlorpheniramine-HYDROcodone (TUSSIONEX) 10-8 MG/5ML    Sig: Take 5 mLs by mouth at bedtime as needed for cough.    Dispense:  70 mL    Refill:  0   DISCONTD: ondansetron (ZOFRAN) 4 MG tablet    Sig: Take 1 tablet (4 mg total) by mouth every 8 (eight) hours as needed for nausea or vomiting.    Dispense:  20 tablet    Refill:  0   ondansetron (ZOFRAN) 4 MG tablet    Sig: Take 1 tablet (4 mg total) by mouth every 8 (eight) hours as needed for nausea or vomiting.    Dispense:  20 tablet    Refill:  0     Follow-up: No follow-ups on file.    Theresia Lo, NP

## 2022-12-02 NOTE — Assessment & Plan Note (Signed)
Started on doxycycline, Tussionex and prednisone tapering. Advised to continue antihistamine and Flonase nasal spray.   Increase fluid intake

## 2022-12-03 ENCOUNTER — Telehealth: Payer: Self-pay | Admitting: Family Medicine

## 2022-12-03 NOTE — Telephone Encounter (Signed)
She needs to be reevaluated if she is still coughing and wheezing.

## 2022-12-03 NOTE — Telephone Encounter (Signed)
Pt states that she did not pick up the Tussionex because it was not covered by her insurance. She did completed the Doxycycline & Prednisone taper as instructed. She is also taking the antihistamine & nasal spray & increased fluid intake as advised at [place].  Please let me know if you would be willing to send in an alternative cough medication or if she needs another appt with [facility] this week.  Pt denies shortness of breath.

## 2022-12-03 NOTE — Telephone Encounter (Signed)
Patient scheduled with [facility] tomorrow.

## 2022-12-03 NOTE — Telephone Encounter (Signed)
Patient seen [facility] on 2/29 for a sinus infection. She finished her antibiotics. She is still wheezing and coughing and wanted to know if she needs another round of medication and could she get some cough medication.

## 2022-12-04 ENCOUNTER — Telehealth: Payer: Self-pay

## 2022-12-04 ENCOUNTER — Ambulatory Visit (INDEPENDENT_AMBULATORY_CARE_PROVIDER_SITE_OTHER): Payer: Medicare Other

## 2022-12-04 ENCOUNTER — Ambulatory Visit (INDEPENDENT_AMBULATORY_CARE_PROVIDER_SITE_OTHER): Payer: Medicare Other | Admitting: Nurse Practitioner

## 2022-12-04 ENCOUNTER — Encounter: Payer: Self-pay | Admitting: Nurse Practitioner

## 2022-12-04 VITALS — BP 124/72 | HR 104 | Temp 98.9°F | Ht 66.0 in | Wt 244.0 lb

## 2022-12-04 DIAGNOSIS — R053 Chronic cough: Secondary | ICD-10-CM | POA: Diagnosis not present

## 2022-12-04 DIAGNOSIS — R062 Wheezing: Secondary | ICD-10-CM | POA: Diagnosis not present

## 2022-12-04 DIAGNOSIS — R058 Other specified cough: Secondary | ICD-10-CM

## 2022-12-04 DIAGNOSIS — R0902 Hypoxemia: Secondary | ICD-10-CM | POA: Insufficient documentation

## 2022-12-04 DIAGNOSIS — J449 Chronic obstructive pulmonary disease, unspecified: Secondary | ICD-10-CM | POA: Diagnosis not present

## 2022-12-04 DIAGNOSIS — R059 Cough, unspecified: Secondary | ICD-10-CM | POA: Insufficient documentation

## 2022-12-04 MED ORDER — BENZONATATE 200 MG PO CAPS: 200.0000 mg | ORAL_CAPSULE | Freq: Two times a day (BID) | ORAL | 0 refills | Status: DC | PRN

## 2022-12-04 MED ORDER — PREDNISONE 10 MG PO TABS: ORAL_TABLET | ORAL | 0 refills | Status: DC

## 2022-12-04 NOTE — Telephone Encounter (Signed)
Spoke to pt and she was at home in her recliner just relaxing but pt did state that insurance would not cover the cough medicine, but pt stated that she thought you were sending in an antibiotic.

## 2022-12-04 NOTE — Patient Instructions (Addendum)
We will  do x rays today. Advised to take  plain mucinex capsules. Continue the nebulizer treatment. Increase hydration. Medication sent a to pharmacy. Call emergency help if difficulty breathing or SOB

## 2022-12-04 NOTE — Assessment & Plan Note (Addendum)
POCT COVID and flu negative. 88 % saturation  on 1 L nasal canula. Nebulizer treatment given in the office  oxygen saturation 92 and HR 88 % Advise to take plain mucinex capsules OTC.  The author and Dr. Nicki Reaper advised patient to go to ER for further evaluation but patient declined.

## 2022-12-04 NOTE — Progress Notes (Signed)
Established Patient Office Visit  Subjective:  Patient ID: Martha Woodard, female    DOB: November 19, 1965  Age: 57 y.o. MRN: [id number]  CC:  Chief Complaint  Patient presents with   Cough    wheezing    HPI  Martha Woodard presents for cough and congestion for more than a week.  She was started on doxycycline 100 mg twice a day  for 7 days and prednisone tapering 6 days without significant improvement.   She states she is wheezing and has more chest congestion and not able to get the phlegm out. She reports of feeling tight.   Denies fever, chills or bodyaches.    Past Medical History:  Diagnosis Date   CHF (congestive heart failure) ([place])    Colon polyp 01-01-2016   COPD (chronic obstructive pulmonary disease) (H[facility])    Diabetes mellitus without complication ([facility])    Diastolic heart failure ([facility])    HLD (hyperlipidemia)    Hypertension    Morbid obesity with BMI of 45.0-49.9, adult ([place])    Multiple lung nodules on CT    Shortness of breath dyspnea     Past Surgical History:  Procedure Laterality Date   BIOPSY THYROID Right 02-06-14   NEGATIVE FOR MALIGNANT CELLS proteinaceous material and macrophages.   BREAST BIOPSY Left    neg- core   COLONOSCOPY WITH PROPOFOL N/A 01/01/2016   Procedure: COLONOSCOPY WITH PROPOFOL;  Surgeon: Lucilla Lame, MD;  Location: [facility] ENDOSCOPY;  Service: Endoscopy;  Laterality: N/A;   thryoid fna Right April 2015   Proteinaceous material and macrophages.   WISDOM TOOTH EXTRACTION      Family History  Problem Relation Age of Onset   Cancer Mother 28       uterian and ovarian    Goiter Mother    Stroke Father    Breast cancer Sister 25   Stroke Brother    Breast cancer Sister 60   Heart murmur Sister     Social History   Socioeconomic History   Marital status: Single    Spouse name: Not on file   Number of children: Not on file   Years of education: Not on file   Highest education level: Not on file  Occupational History   Not on  file  Tobacco Use   Smoking status: Former    Packs/day: 3.00    Years: 34.00    Additional pack years: 0.00    Total pack years: 102.00    Types: Cigarettes    Quit date: 07/10/2013    Years since quitting: 9.4   Smokeless tobacco: Never  Vaping Use   Vaping Use: Former  Substance and Sexual Activity   Alcohol use: No    Alcohol/week: 0.0 standard drinks of alcohol   Drug use: No   Sexual activity: Not Currently  Other Topics Concern   Not on file  Social History Narrative   Not on file   Social Determinants of Health   Financial Resource Strain: Low Risk  (08/14/2022)   Overall Financial Resource Strain (CARDIA)    Difficulty of Paying Living Expenses: Not very hard  Food Insecurity: No Food Insecurity (09/19/2022)   Hunger Vital Sign    Worried About Running Out of Food in the Last Year: Never true    Ran Out of Food in the Last Year: Never true  Transportation Needs: No Transportation Needs (09/19/2022)   PRAPARE - Hydrologist (Medical): No  Lack of Transportation (Non-Medical): No  Physical Activity: Insufficiently Active (08/14/2022)   Exercise Vital Sign    Days of Exercise per Week: 7 days    Minutes of Exercise per Session: 20 min  Stress: No Stress Concern Present (08/14/2022)   [place]    Feeling of Stress : Not at all  Social Connections: Socially Isolated (08/14/2022)   Social Connection and Isolation Panel [NHANES]    Frequency of Communication with Friends and Family: More than three times a week    Frequency of Social Gatherings with Friends and Family: Twice a week    Attends Religious Services: Never    Marine scientist or Organizations: No    Attends Archivist Meetings: Never    Marital Status: Divorced  Human resources officer Violence: Not At Risk (09/16/2022)   Humiliation, Afraid, Rape, and Kick questionnaire    Fear of Current or  Ex-Partner: No    Emotionally Abused: No    Physically Abused: No    Sexually Abused: No     Outpatient Medications Prior to Visit  Medication Sig Dispense Refill   ACCU-CHEK AVIVA PLUS test strip TEST UP TO 4 TIMES DAILY AS DIRECTED 400 strip 3   Accu-Chek Softclix Lancets lancets TEST UP TO 4 TIMES DAILY 400 each 3   acetaminophen (TYLENOL) 650 MG CR tablet Take 650 mg by mouth every 8 (eight) hours as needed for pain.     albuterol (PROVENTIL) (2.5 MG/3ML) 0.083% nebulizer solution Take 3 mLs (2.5 mg total) by nebulization every 6 (six) hours as needed for wheezing or shortness of breath. 75 mL 12   aspirin EC 81 MG tablet Take 1 tablet (81 mg total) by mouth daily. 90 tablet 3   beclomethasone (QVAR REDIHALER) 80 MCG/ACT inhaler Inhale 2 puffs into the lungs 2 (two) times daily. 10.6 g 11   blood glucose meter kit and supplies Dispense based on patient and insurance preference. Use up to four times daily as directed. (FOR ICD-9 250.00, 250.01). 1 each 0   calcium-vitamin D (OSCAL WITH D) 500-200 MG-UNIT tablet Take 1 tablet by mouth 2 (two) times daily.     cetirizine (ZYRTEC) 10 MG tablet Take 10 mg by mouth daily.     chlorpheniramine-HYDROcodone (TUSSIONEX) 10-8 MG/5ML Take 5 mLs by mouth at bedtime as needed for cough. 70 mL 0   fluticasone (FLONASE) 50 MCG/ACT nasal spray USE 1 OR 2 SPRAYS IN EACH NOSTRIL ONCE DAILY. 16 g 12   furosemide (LASIX) 20 MG tablet Take 1 tablet (20 mg total) by mouth daily. 90 tablet 3   gabapentin (NEURONTIN) 100 MG capsule TAKE 1 CAPSULE BY MOUTH 3 TIMES  DAILY 270 capsule 3   Glycopyrrolate-Formoterol (BEVESPI AEROSPHERE) 9-4.8 MCG/ACT AERO Inhale 2 puffs into the lungs 2 (two) times daily. 10.7 g 11   hydrochlorothiazide (MICROZIDE) 12.5 MG capsule Take 12.5 mg by mouth daily.     losartan (COZAAR) 25 MG tablet TAKE 1 TABLET BY MOUTH TWICE  DAILY 180 tablet 3   magnesium oxide (MAG-OX) 400 MG tablet Take 400 mg by mouth daily.     metFORMIN  (GLUCOPHAGE) 500 MG tablet TAKE 2 TABLETS BY MOUTH  TWICE DAILY WITH MEALS 360 tablet 3   metoprolol succinate (TOPROL-XL) 25 MG 24 hr tablet TAKE 1 TABLET BY MOUTH ONCE  DAILY 90 tablet 3   montelukast (SINGULAIR) 10 MG tablet TAKE 1 TABLET BY MOUTH AT  BEDTIME 90 tablet  3   Multiple Vitamin (MULTIVITAMIN) tablet Take 1 tablet by mouth daily.     ondansetron (ZOFRAN) 4 MG tablet Take 1 tablet (4 mg total) by mouth every 8 (eight) hours as needed for nausea or vomiting. 20 tablet 0   potassium chloride (KLOR-CON) 10 MEQ tablet TAKE 1 TABLET BY MOUTH TWICE  DAILY 180 tablet 0   PROVENTIL HFA 108 (90 Base) MCG/ACT inhaler Inhale 2 puffs into the lungs every 6 (six) hours as needed for wheezing or shortness of breath. 18 gram inhaler 3 each 1   rosuvastatin (CRESTOR) 20 MG tablet TAKE 1 TABLET BY MOUTH DAILY 90 tablet 3   Semaglutide,0.25 or 0.5MG /DOS, (OZEMPIC, 0.25 OR 0.5 MG/DOSE,) 2 MG/3ML SOPN Inject 0.25 mg weekly for 4 weeks, then increase to 0.5 mg weekly 3 mL 2   sodium chloride (OCEAN) 0.65 % SOLN nasal spray Place 2 sprays into both nostrils as needed for congestion. 30 mL 11   vitamin C (ASCORBIC ACID) 500 MG tablet Take 1,000 mg by mouth daily.      Vitamin E 180 MG CAPS Take 180 mg by mouth daily.     doxycycline (VIBRA-TABS) 100 MG tablet Take 1 tablet (100 mg total) by mouth 2 (two) times daily for 7 days. (Patient not taking: Reported on 12/04/2022) 14 tablet 0   predniSONE (DELTASONE) 10 MG tablet Take 6 tablets ( total 60 mg) PO for 1 day, take 5 tablets ( total 50 mg) PO for 1 day; take 4 tablets ( total 40 mg) PO for 1 day; take 3 tablets ( total 30 mg) PO  for 1 day, take 2 tablets ( total 20mg ) PO for 1 day, take 1 tablet PO x one day. (Patient not taking: Reported on 12/04/2022) 21 tablet 0   No facility-administered medications prior to visit.    Allergies  Allergen Reactions   Augmentin [Amoxicillin-Pot Clavulanate] Itching    Hives Can take PCN and Amoxicillin alone      ROS Review of Systems  Constitutional: Negative.   HENT:  Positive for postnasal drip. Negative for sore throat.   Respiratory:  Positive for cough and wheezing.   Cardiovascular:  Negative for leg swelling.  Gastrointestinal:  Positive for nausea.  Genitourinary: Negative.   Musculoskeletal: Negative.   Neurological: Negative.   Psychiatric/Behavioral: Negative.        Objective:    Physical Exam Constitutional:      Appearance: Normal appearance.  HENT:     Right Ear: Tympanic membrane normal.     Left Ear: Tympanic membrane normal.     Nose:     Right Sinus: Maxillary sinus tenderness and frontal sinus tenderness present.     Left Sinus: Maxillary sinus tenderness and frontal sinus tenderness present.     Mouth/Throat:     Mouth: Mucous membranes are moist.  Cardiovascular:     Rate and Rhythm: Normal rate and regular rhythm.     Pulses: Normal pulses.     Heart sounds: Normal heart sounds.  Pulmonary:     Effort: Pulmonary effort is normal.     Breath sounds: Wheezing present.  Neurological:     General: No focal deficit present.     Mental Status: She is alert and oriented to person, place, and time. Mental status is at baseline.  Psychiatric:        Mood and Affect: Mood normal.        Behavior: Behavior normal.        Thought  Content: Thought content normal.        Judgment: Judgment normal.     BP 124/72   Pulse (!) 104   Temp 98.9 F (37.2 C) (Oral)   Ht 5\' 6"  (1.676 m)   Wt 244 lb (110.7 kg)   LMP 04/10/2014   SpO2 92% Comment: 1 L [place] after nebulizer treatement  BMI 39.38 kg/m  Wt Readings from Last 3 Encounters:  12/04/22 244 lb (110.7 kg)  11/27/22 247 lb 9.6 oz (112.3 kg)  11/20/22 248 lb (112.5 kg)     Health Maintenance  Topic Date Due   DTaP/Tdap/Td (1 - Tdap) Never done   Zoster Vaccines- Shingrix (1 of 2) Never done   COVID-19 Vaccine (3 - Moderna risk series) 12/03/2020   FOOT EXAM  01/07/2023   OPHTHALMOLOGY EXAM   02/05/2023   HEMOGLOBIN A1C  03/12/2023   Diabetic kidney evaluation - Urine ACR  04/08/2023   Lung Cancer Screening  07/17/2023   Medicare Annual Wellness (AWV)  08/15/2023   Diabetic kidney evaluation - eGFR measurement  10/17/2023   MAMMOGRAM  04/30/2024   COLONOSCOPY (Pts 45-43yrs Insurance coverage will need to be confirmed)  12/31/2025   PAP SMEAR-Modifier  02/05/2026   INFLUENZA VACCINE  Completed   Hepatitis C Screening  Completed   HIV Screening  Completed   HPV VACCINES  Aged Out    There are no preventive care reminders to display for this patient.  Lab Results  Component Value Date   TSH 0.90 05/24/2021   Lab Results  Component Value Date   WBC 6.7 09/17/2022   HGB 12.3 09/17/2022   HCT 39.5 09/17/2022   MCV 88.2 09/17/2022   PLT 166 09/17/2022   Lab Results  Component Value Date   NA 143 10/16/2022   K 4.2 10/16/2022   CO2 36 (H) 10/16/2022   GLUCOSE 187 (H) 10/16/2022   BUN 10 10/16/2022   CREATININE 0.62 10/16/2022   BILITOT 0.6 09/16/2022   ALKPHOS 42 09/16/2022   AST 61 (H) 09/16/2022   ALT 57 (H) 09/16/2022   PROT 7.0 09/16/2022   ALBUMIN 3.7 09/16/2022   CALCIUM 9.9 10/16/2022   ANIONGAP 6 09/17/2022   GFR 98.39 10/16/2022   Lab Results  Component Value Date   CHOL 109 11/26/2021   Lab Results  Component Value Date   HDL 45.90 11/26/2021   Lab Results  Component Value Date   LDLCALC 40 11/26/2021   Lab Results  Component Value Date   TRIG 114.0 11/26/2021   Lab Results  Component Value Date   CHOLHDL 2 11/26/2021   Lab Results  Component Value Date   HGBA1C 7.5 (H) 09/10/2022      Assessment & Plan:   Problem List Items Addressed This Visit       Respiratory   Hypoxia    SpO2 88% on 1 L nasal cannula. After nebulizer treatment SpO2 increased to 92%.        Other   Cough - Primary    POCT COVID and flu negative. 88 % saturation  on 1 L nasal canula. Nebulizer treatment given in the office  oxygen saturation 92  and HR 88 % Advise to take plain mucinex capsules OTC.  The author and Dr. Nicki Reaper advised patient to go to ER for further evaluation but patient declined.       Relevant Orders   DG Chest 2 View (Completed)   Wheezing   Relevant Orders   DG Chest  2 View (Completed)    Meds ordered this encounter  Medications   benzonatate (TESSALON) 200 MG capsule    Sig: Take 1 capsule (200 mg total) by mouth 2 (two) times daily as needed for cough.    Dispense:  20 capsule    Refill:  0   predniSONE (DELTASONE) 10 MG tablet    Sig: Take 6 tablets ( total 60 mg) PO for 1 day, take 5 tablets ( total 50 mg) PO for 1 day; take 4 tablets ( total 40 mg) PO for 1 day; take 3 tablets ( total 30 mg) PO  for 1 day, take 2 tablets ( total 20mg ) PO for 1 day, take 1 tablet PO x one day.    Dispense:  21 tablet    Refill:  0   azithromycin (ZITHROMAX) 250 MG tablet    Sig: Take 2 tablets on day 1, then 1 tablet daily on days 2 through 5    Dispense:  6 tablet    Refill:  0     Follow-up: Return if symptoms worsen or fail to improve.    Theresia Lo, NP

## 2022-12-05 ENCOUNTER — Encounter: Payer: Self-pay | Admitting: Pulmonary Disease

## 2022-12-05 MED ORDER — AZITHROMYCIN 250 MG PO TABS: ORAL_TABLET | ORAL | 0 refills | Status: AC

## 2022-12-05 NOTE — Progress Notes (Signed)
The Xray shows no infection. But changes consistent with COPD.  Antibiotic Azithromycin sent to pharmacy.  How are you feeling now?

## 2022-12-15 DIAGNOSIS — M9905 Segmental and somatic dysfunction of pelvic region: Secondary | ICD-10-CM | POA: Diagnosis not present

## 2022-12-15 DIAGNOSIS — M9904 Segmental and somatic dysfunction of sacral region: Secondary | ICD-10-CM | POA: Diagnosis not present

## 2022-12-15 DIAGNOSIS — M955 Acquired deformity of pelvis: Secondary | ICD-10-CM | POA: Diagnosis not present

## 2022-12-15 DIAGNOSIS — M5432 Sciatica, left side: Secondary | ICD-10-CM | POA: Diagnosis not present

## 2022-12-16 NOTE — Assessment & Plan Note (Signed)
SpO2 88% on 1 L nasal cannula. After nebulizer treatment SpO2 increased to 92%.

## 2022-12-17 DIAGNOSIS — J449 Chronic obstructive pulmonary disease, unspecified: Secondary | ICD-10-CM | POA: Diagnosis not present

## 2022-12-18 ENCOUNTER — Other Ambulatory Visit: Payer: Self-pay | Admitting: Cardiovascular Disease

## 2022-12-18 DIAGNOSIS — I491 Atrial premature depolarization: Secondary | ICD-10-CM

## 2022-12-19 ENCOUNTER — Other Ambulatory Visit: Payer: Medicare Other | Admitting: Pharmacist

## 2022-12-19 NOTE — Progress Notes (Signed)
12/19/2022 Name: Martha Woodard MRN: [id number] DOB: [birth year]  Chief Complaint  Patient presents with   Medication Management   Diabetes    Martha Woodard is a 57 y.o. year old female who presented for a telephone visit.   They were referred to the pharmacist by their PCP for assistance in managing medication access.   Subjective:  Care Team: Primary Care Provider: Leone Haven, MD ; Next Scheduled Visit: not scheduled  Medication Access/Adherence  Current Pharmacy:  [facility] ([place], [place] [facility] [address] [place] [address] Phone: [phone] Fax: [place], [place]. [place]  [address] Phone: [phone] Fax: [place], [place]. [place] [address] Phone: [phone] Fax: [place], [place] [address] Phone: [phone] Fax: [phone]   Patient reports affordability concerns with their medications: No  Patient reports access/transportation concerns to their pharmacy: No  Patient reports adherence concerns with their medications:  No     Diabetes:  Current medications: Ozempic 0.5 mg weekly, metformin 1000 mg twice daily  Current glucose readings: 90-110s  Does report a decrease in appetite, weight loss since starting on Ozempic. Does note some constipation  Current meal patterns: Tries to avoid excessive salt, grease; often gets chicken breasts and fixes that with vegetables. Likes broccoli, zucchini, squash. Cabbage. Focuses on lean proteins.   Asthma/COPD  Current medications: Bevespi through [facility] patient assistance; Qvar added by Dr. Patsey Berthold but patient was worried about cost, so has not tried to fill her prescription yet.    Objective:  Lab  Results  Component Value Date   HGBA1C 7.5 (H) 09/10/2022    Lab Results  Component Value Date   CREATININE 0.62 10/16/2022   BUN 10 10/16/2022   NA 143 10/16/2022   K 4.2 10/16/2022   CL 99 10/16/2022   CO2 36 (H) 10/16/2022    Lab Results  Component Value Date   CHOL 109 11/26/2021   HDL 45.90 11/26/2021   LDLCALC 40 11/26/2021   LDLDIRECT 57.0 07/27/2018   TRIG 114.0 11/26/2021   CHOLHDL 2 11/26/2021    Medications Reviewed Today     Reviewed by Osker Mason, RPH-CPP (Pharmacist) on 12/19/22 at 1347  Med List Status: <None>   Medication Order Taking? Sig Documenting Provider Last Dose Status Informant  ACCU-CHEK AVIVA PLUS test strip [id number]  TEST UP TO 4 TIMES DAILY AS DIRECTED Dutch Quint B, FNP  Active Self  Accu-Chek Softclix Lancets lancets [id number]  TEST UP TO 4 TIMES DAILY Leone Haven, MD  Active Self  acetaminophen (TYLENOL) 650 MG CR tablet [id number]  Take 650 mg by mouth every 8 (eight) hours as needed for pain. [provider]  Active Self           Med Note Darnelle Maffucci, Arville Lime   Wed Apr 24, 2021  2:03 PM)    albuterol (PROVENTIL) (2.5 MG/3ML) 0.083% nebulizer solution [id number]  Take 3 mLs (2.5 mg total) by nebulization every 6 (six) hours as needed for wheezing or shortness of breath. McLean-Scocuzza, Nino Glow, MD  Active Self  aspirin EC 81 MG tablet [id number]  Take 1 tablet (81 mg total) by mouth daily.  Coral Spikes, DO  Active Self  beclomethasone (QVAR REDIHALER) 80 MCG/ACT inhaler [id number] No Inhale 2 puffs into the lungs 2 (two) times daily.  Patient not taking: Reported on 12/19/2022   Tyler Pita, MD Not Taking Active   benzonatate (TESSALON) 200 MG capsule [id number]  Take 1 capsule (200 mg total) by mouth 2 (two) times daily as needed for cough. Theresia Lo, NP  Active   blood glucose meter kit and supplies [id number]  Dispense based on patient and insurance preference. Use up to four times daily as directed.  (FOR ICD-9 250.00, 250.01). Coral Spikes, DO  Active Self  calcium-vitamin D (OSCAL WITH D) 500-200 MG-UNIT tablet [id number]  Take 1 tablet by mouth 2 (two) times daily. [provider]  Active Self  cetirizine (ZYRTEC) 10 MG tablet [id number]  Take 10 mg by mouth daily. [provider]  Active Self  chlorpheniramine-HYDROcodone (TUSSIONEX) 10-8 MG/5ML [id number]  Take 5 mLs by mouth at bedtime as needed for cough. Theresia Lo, NP  Active   fluticasone (FLONASE) 50 MCG/ACT nasal spray [id number]  USE 1 OR 2 SPRAYS IN EACH NOSTRIL ONCE DAILY. Theresia Lo, NP  Active   furosemide (LASIX) 20 MG tablet [id number]  TAKE 1 TABLET BY MOUTH DAILY Rockey Situ, Kathlene November, MD  Active   gabapentin (NEURONTIN) 100 MG capsule [id number]  TAKE 1 CAPSULE BY MOUTH 3 TIMES  DAILY Kennyth Arnold, FNP  Active Self, Pharmacy Records           Med Note [place], Grace Bushy   Tue Oct 14, 2022  1:31 PM) Twice daily  Glycopyrrolate-Formoterol (BEVESPI AEROSPHERE) 9-4.8 MCG/ACT Hollie Salk [id number]  Inhale 2 puffs into the lungs 2 (two) times daily. Tyler Pita, MD  Active Self           Med Note Jodi Mourning, Loraine Grip Oct 14, 2022  1:26 PM)    hydrochlorothiazide (MICROZIDE) 12.5 MG capsule [id number]  TAKE 1 CAPSULE BY MOUTH DAILY Rockey Situ Kathlene November, MD  Active   losartan (COZAAR) 25 MG tablet [id number]  TAKE 1 TABLET BY MOUTH TWICE  DAILY Gollan, Kathlene November, MD  Active   magnesium oxide (MAG-OX) 400 MG tablet [id number]  Take 400 mg by mouth daily. [provider]  Active Self  metFORMIN (GLUCOPHAGE) 500 MG tablet [id number]  TAKE 2 TABLETS BY MOUTH  TWICE DAILY WITH MEALS Leone Haven, MD  Active Self, Pharmacy Records  metoprolol succinate (TOPROL-XL) 25 MG 24 hr tablet [id number]  TAKE 1 TABLET BY MOUTH ONCE  DAILY Rockey Situ, Kathlene November, MD  Active   montelukast (SINGULAIR) 10 MG tablet [id number]  TAKE 1 TABLET BY MOUTH AT  BEDTIME Leone Haven, MD  Active Self, Pharmacy Records   Multiple Vitamin (MULTIVITAMIN) tablet [id number]  Take 1 tablet by mouth daily. [provider]  Active Self  ondansetron (ZOFRAN) 4 MG tablet [id number]  Take 1 tablet (4 mg total) by mouth every 8 (eight) hours as needed for nausea or vomiting. Theresia Lo, NP  Active   potassium chloride (KLOR-CON) 10 MEQ tablet [id number]  TAKE 1 TABLET BY MOUTH TWICE  DAILY Gollan, Kathlene November, MD  Active   predniSONE (DELTASONE) 10 MG tablet [id number] Yes Take 6 tablets ( total 60 mg) PO for 1 day, take 5 tablets ( total 50 mg) PO for 1 day; take 4 tablets ( total 40 mg) PO for 1 day; take 3 tablets ( total 30 mg) PO  for 1  day, take 2 tablets ( total 20mg ) PO for 1 day, take 1 tablet PO x one day. Theresia Lo, NP Taking Active   PROVENTIL HFA 108 [phone] Base) MCG/ACT inhaler [id number]  Inhale 2 puffs into the lungs every 6 (six) hours as needed for wheezing or shortness of breath. 18 gram inhaler McLean-Scocuzza, Nino Glow, MD  Active Self  rosuvastatin (CRESTOR) 20 MG tablet [id number]  TAKE 1 TABLET BY MOUTH DAILY Gollan, Kathlene November, MD  Active   Semaglutide,0.25 or 0.5MG /DOS, (OZEMPIC, 0.25 OR 0.5 MG/DOSE,) 2 MG/3ML SOPN [id number] Yes Inject 0.25 mg weekly for 4 weeks, then increase to 0.5 mg weekly Leone Haven, MD Taking Active   sodium chloride (OCEAN) 0.65 % SOLN nasal spray [id number]  Place 2 sprays into both nostrils as needed for congestion. McLean-Scocuzza, Nino Glow, MD  Active Self  vitamin C (ASCORBIC ACID) 500 MG tablet [id number]  Take 1,000 mg by mouth daily.  [provider]  Active Self  Vitamin E 180 MG CAPS [id number]  Take 180 mg by mouth daily. [provider]  Active Self              Assessment/Plan:   Diabetes: - Currently uncontrolled - Reviewed long term cardiovascular and renal outcomes of uncontrolled blood sugar - Reviewed goal A1c, goal fasting, and goal 2 hour post prandial glucose - Reviewed dietary modifications including continued  focus on lean proteins, fruits and vegetables, whole grains.  - Recommend to continue Ozempic 0.5 mg weekly and metformin 1000 mg twice daily. Assisted in rescheduling PCP visit.   COPD/Asthma: - Currently uncontrolled.  - Reviewed appropriate inhaler technique. - Recommend to take Qvar prescription to the pharmacy to determine cost. Discussed recently advertised programs from inhaler manufacturers touring $35 copay. Will follow.   Follow Up Plan: follow up with PCP as scheduled  Catie Hedwig Morton, PharmD, [place], [facility] [phone]

## 2022-12-26 ENCOUNTER — Ambulatory Visit: Payer: Medicare Other | Admitting: Family Medicine

## 2022-12-28 ENCOUNTER — Other Ambulatory Visit: Payer: Self-pay | Admitting: Family

## 2022-12-28 DIAGNOSIS — R232 Flushing: Secondary | ICD-10-CM

## 2022-12-29 DIAGNOSIS — M9904 Segmental and somatic dysfunction of sacral region: Secondary | ICD-10-CM | POA: Diagnosis not present

## 2022-12-29 DIAGNOSIS — M9905 Segmental and somatic dysfunction of pelvic region: Secondary | ICD-10-CM | POA: Diagnosis not present

## 2022-12-29 DIAGNOSIS — M955 Acquired deformity of pelvis: Secondary | ICD-10-CM | POA: Diagnosis not present

## 2022-12-29 DIAGNOSIS — M5432 Sciatica, left side: Secondary | ICD-10-CM | POA: Diagnosis not present

## 2023-01-12 ENCOUNTER — Encounter: Payer: Self-pay | Admitting: Family Medicine

## 2023-01-12 ENCOUNTER — Ambulatory Visit (INDEPENDENT_AMBULATORY_CARE_PROVIDER_SITE_OTHER): Payer: Medicare Other | Admitting: Family Medicine

## 2023-01-12 VITALS — BP 128/78 | HR 85 | Temp 97.4°F | Ht 66.0 in | Wt 247.0 lb

## 2023-01-12 DIAGNOSIS — E1159 Type 2 diabetes mellitus with other circulatory complications: Secondary | ICD-10-CM

## 2023-01-12 DIAGNOSIS — R5383 Other fatigue: Secondary | ICD-10-CM | POA: Diagnosis not present

## 2023-01-12 DIAGNOSIS — J309 Allergic rhinitis, unspecified: Secondary | ICD-10-CM

## 2023-01-12 DIAGNOSIS — Z8639 Personal history of other endocrine, nutritional and metabolic disease: Secondary | ICD-10-CM | POA: Diagnosis not present

## 2023-01-12 DIAGNOSIS — B370 Candidal stomatitis: Secondary | ICD-10-CM

## 2023-01-12 DIAGNOSIS — I10 Essential (primary) hypertension: Secondary | ICD-10-CM

## 2023-01-12 DIAGNOSIS — J439 Emphysema, unspecified: Secondary | ICD-10-CM | POA: Diagnosis not present

## 2023-01-12 LAB — CBC
HCT: 42.7 % (ref 36.0–46.0)
Hemoglobin: 14.1 g/dL (ref 12.0–15.0)
MCHC: 33 g/dL (ref 30.0–36.0)
MCV: 84.1 fl (ref 78.0–100.0)
Platelets: 222 10*3/uL (ref 150.0–400.0)
RBC: 5.08 Mil/uL (ref 3.87–5.11)
RDW: 15 % (ref 11.5–15.5)
WBC: 6.3 10*3/uL (ref 4.0–10.5)

## 2023-01-12 LAB — TSH: TSH: 0.91 u[IU]/mL (ref 0.35–5.50)

## 2023-01-12 LAB — COMPREHENSIVE METABOLIC PANEL
ALT: 26 U/L (ref 0–35)
AST: 23 U/L (ref 0–37)
Albumin: 4.3 g/dL (ref 3.5–5.2)
Alkaline Phosphatase: 48 U/L (ref 39–117)
BUN: 11 mg/dL (ref 6–23)
CO2: 32 mEq/L (ref 19–32)
Calcium: 9.8 mg/dL (ref 8.4–10.5)
Chloride: 100 mEq/L (ref 96–112)
Creatinine, Ser: 0.54 mg/dL (ref 0.40–1.20)
GFR: 101.55 mL/min (ref >60.00)
Glucose, Bld: 65 mg/dL — ABNORMAL LOW (ref 70–99)
Potassium: 4.3 mEq/L (ref 3.5–5.1)
Sodium: 143 mEq/L (ref 135–145)
Total Bilirubin: 0.3 mg/dL (ref 0.2–1.2)
Total Protein: 6.5 g/dL (ref 6.0–8.3)

## 2023-01-12 LAB — IBC + FERRITIN
Ferritin: 28 ng/mL (ref 10.0–291.0)
Iron: 83 ug/dL (ref 42–145)
Saturation Ratios: 21 % (ref 20.0–50.0)
TIBC: 394.8 ug/dL (ref 250.0–450.0)
Transferrin: 282 mg/dL (ref 212.0–360.0)

## 2023-01-12 LAB — HEMOGLOBIN A1C: Hgb A1c MFr Bld: 6.3 % (ref 4.6–6.5)

## 2023-01-12 LAB — VITAMIN D 25 HYDROXY (VIT D DEFICIENCY, FRACTURES): VITD: 61.76 ng/mL (ref 30.00–100.00)

## 2023-01-12 LAB — VITAMIN B12: Vitamin B-12: 210 pg/mL — ABNORMAL LOW (ref 211–911)

## 2023-01-12 MED ORDER — AZELASTINE HCL 0.1 % NA SOLN
2.0000 | Freq: Two times a day (BID) | NASAL | 12 refills | Status: DC
Start: 2023-01-12 — End: 2024-07-06

## 2023-01-12 MED ORDER — NYSTATIN 100000 UNIT/ML MT SUSP
5.0000 mL | Freq: Four times a day (QID) | OROMUCOSAL | 0 refills | Status: DC
Start: 2023-01-12 — End: 2024-01-22

## 2023-01-12 NOTE — Assessment & Plan Note (Signed)
History of this. Nystatin sent to pharmacy to have on hand in case she develops thrush.

## 2023-01-12 NOTE — Assessment & Plan Note (Signed)
I suspect this is related to her chronic underlying medical issues though we will check lab work to evaluate for other underlying causes.

## 2023-01-12 NOTE — Progress Notes (Signed)
Marikay Alar, MD Phone: [phone]  Martha Woodard is a 57 y.o. female who presents today for f/u.  COPD: Medication compliance- bevespi, qvar  Dyspnea- stable  Wheezing- no  Cough- no   Notes she was sick recently though has improved after a couple rounds of antibiotics.  She feels back to her baseline.  HYPERTENSION Disease Monitoring Home BP Monitoring not checking Chest pain- no    Dyspnea- stable Medications Compliance-  taking HCTZ, losartan.  Edema- no BMET    Component Value Date/Time   NA 143 10/16/2022 1021   K 4.2 10/16/2022 1021   CL 99 10/16/2022 1021   CO2 36 (H) 10/16/2022 1021   GLUCOSE 187 (H) 10/16/2022 1021   BUN 10 10/16/2022 1021   BUN 8 02/10/2014 1112   CREATININE 0.62 10/16/2022 1021   CALCIUM 9.9 10/16/2022 1021   GFRNONAA >60 09/17/2022 0713   GFRAA >60 12/03/2018 1503   DIABETES Disease Monitoring: Blood Sugar ranges-130 highest Polyuria/phagia/dipsia- no     Optho- UTD Medications: Compliance- taking metformin, ozempic Hypoglycemic symptoms- no  Sinus drainage: Patient notes this is a chronic ongoing issue.  She does sneeze and her eyes water a lot.  She has some congestion around her nose and eyes.  She does use Flonase, Zyrtec, and Singulair.  Fatigue: Patient notes some chronic fatigue.  She wonders if it could be related to iron deficiency as she had that in the distant past.    Social History   Tobacco Use  Smoking Status Former   Packs/day: 3.00   Years: 34.00   Additional pack years: 0.00   Total pack years: 102.00   Types: Cigarettes   Quit date: 07/10/2013   Years since quitting: 9.5  Smokeless Tobacco Never    Current Outpatient Medications on File Prior to Visit  Medication Sig Dispense Refill   ACCU-CHEK AVIVA PLUS test strip TEST UP TO 4 TIMES DAILY AS DIRECTED 400 strip 3   Accu-Chek Softclix Lancets lancets TEST UP TO 4 TIMES DAILY 400 each 3   acetaminophen (TYLENOL) 650 MG CR tablet Take 650 mg by mouth  every 8 (eight) hours as needed for pain.     albuterol (PROVENTIL) (2.5 MG/3ML) 0.083% nebulizer solution Take 3 mLs (2.5 mg total) by nebulization every 6 (six) hours as needed for wheezing or shortness of breath. 75 mL 12   aspirin EC 81 MG tablet Take 1 tablet (81 mg total) by mouth daily. 90 tablet 3   beclomethasone (QVAR REDIHALER) 80 MCG/ACT inhaler Inhale 2 puffs into the lungs 2 (two) times daily. 10.6 g 11   blood glucose meter kit and supplies Dispense based on patient and insurance preference. Use up to four times daily as directed. (FOR ICD-9 250.00, 250.01). 1 each 0   calcium-vitamin D (OSCAL WITH D) 500-200 MG-UNIT tablet Take 1 tablet by mouth 2 (two) times daily.     cetirizine (ZYRTEC) 10 MG tablet Take 10 mg by mouth daily.     fluticasone (FLONASE) 50 MCG/ACT nasal spray USE 1 OR 2 SPRAYS IN EACH NOSTRIL ONCE DAILY. 16 g 12   furosemide (LASIX) 20 MG tablet TAKE 1 TABLET BY MOUTH DAILY 90 tablet 0   gabapentin (NEURONTIN) 100 MG capsule TAKE 1 CAPSULE BY MOUTH 3 TIMES  DAILY 270 capsule 3   Glycopyrrolate-Formoterol (BEVESPI AEROSPHERE) 9-4.8 MCG/ACT AERO Inhale 2 puffs into the lungs 2 (two) times daily. 10.7 g 11   hydrochlorothiazide (MICROZIDE) 12.5 MG capsule TAKE 1 CAPSULE BY MOUTH  DAILY 90 capsule 0   losartan (COZAAR) 25 MG tablet TAKE 1 TABLET BY MOUTH TWICE  DAILY 180 tablet 0   magnesium oxide (MAG-OX) 400 MG tablet Take 400 mg by mouth daily.     metFORMIN (GLUCOPHAGE) 500 MG tablet TAKE 2 TABLETS BY MOUTH  TWICE DAILY WITH MEALS 360 tablet 3   metoprolol succinate (TOPROL-XL) 25 MG 24 hr tablet TAKE 1 TABLET BY MOUTH ONCE  DAILY 90 tablet 0   montelukast (SINGULAIR) 10 MG tablet TAKE 1 TABLET BY MOUTH AT  BEDTIME 90 tablet 3   Multiple Vitamin (MULTIVITAMIN) tablet Take 1 tablet by mouth daily.     ondansetron (ZOFRAN) 4 MG tablet Take 1 tablet (4 mg total) by mouth every 8 (eight) hours as needed for nausea or vomiting. 20 tablet 0   potassium chloride  (KLOR-CON) 10 MEQ tablet TAKE 1 TABLET BY MOUTH TWICE  DAILY 180 tablet 0   PROVENTIL HFA 108 (90 Base) MCG/ACT inhaler Inhale 2 puffs into the lungs every 6 (six) hours as needed for wheezing or shortness of breath. 18 gram inhaler 3 each 1   rosuvastatin (CRESTOR) 20 MG tablet TAKE 1 TABLET BY MOUTH DAILY 90 tablet 0   Semaglutide,0.25 or 0.5MG /DOS, (OZEMPIC, 0.25 OR 0.5 MG/DOSE,) 2 MG/3ML SOPN Inject 0.25 mg weekly for 4 weeks, then increase to 0.5 mg weekly 3 mL 2   sodium chloride (OCEAN) 0.65 % SOLN nasal spray Place 2 sprays into both nostrils as needed for congestion. 30 mL 11   vitamin C (ASCORBIC ACID) 500 MG tablet Take 1,000 mg by mouth daily.      Vitamin E 180 MG CAPS Take 180 mg by mouth daily.     No current facility-administered medications on file prior to visit.     ROS see history of present illness  Objective  Physical Exam Vitals:   01/12/23 1029 01/12/23 1051  BP: 134/78 128/78  Pulse: 85   Temp: (!) 97.4 F (36.3 C)   SpO2: 93%     BP Readings from Last 3 Encounters:  01/12/23 128/78  12/04/22 124/72  11/27/22 (!) 140/82   Wt Readings from Last 3 Encounters:  01/12/23 247 lb (112 kg)  12/04/22 244 lb (110.7 kg)  11/27/22 247 lb 9.6 oz (112.3 kg)    Physical Exam Constitutional:      General: She is not in acute distress.    Appearance: She is not diaphoretic.  Cardiovascular:     Rate and Rhythm: Normal rate and regular rhythm.     Heart sounds: Normal heart sounds.  Pulmonary:     Effort: Pulmonary effort is normal.     Breath sounds: Normal breath sounds.  Musculoskeletal:     Right lower leg: No edema.     Left lower leg: No edema.  Skin:    General: Skin is warm and dry.  Neurological:     Mental Status: She is alert.      Assessment/Plan: Please see individual problem list.  Essential hypertension Assessment & Plan: Chronic issue.  Adequately controlled on recheck.  She will continue metoprolol 25 mg daily, losartan 25 mg  twice daily, and HCTZ 12.5 mg daily.  Check labs.  Orders: -     Comprehensive metabolic panel  Type 2 diabetes mellitus with other circulatory complication, without long-term current use of insulin Assessment & Plan: Chronic issue.  Seems to be very well-controlled.  She will continue metformin 1000 mg twice daily and Ozempic 0.5 mg weekly.  Check A1c today.  Orders: -     Hemoglobin A1c  Pulmonary emphysema, unspecified emphysema type Assessment & Plan: Chronic issue.  Seems to be back to her baseline after recent illness.  She will continue her Qvar 2 puffs twice daily and Bevespi 2 puffs twice daily.  Patient does report intermittently getting thrush with the use of her inhalers.  She does rinse her mouth appropriately.  She is requesting a refill on nystatin to use in case she gets thrush.   Allergic rhinitis, unspecified seasonality, unspecified trigger Assessment & Plan: Chronic issue.  Symptoms seem worse at this point.  We will add Astelin nasal spray 2 sprays each nostril twice daily.  She will continue Flonase, Zyrtec, and Singulair.  Orders: -     Azelastine HCl; Place 2 sprays into both nostrils 2 (two) times daily. Use in each nostril as directed  Dispense: 30 mL; Refill: 12  Other fatigue Assessment & Plan: I suspect this is related to her chronic underlying medical issues though we will check lab work to evaluate for other underlying causes.  Orders: -     TSH -     CBC -     Vitamin B12 -     VITAMIN D 25 Hydroxy (Vit-D Deficiency, Fractures)  Thrush Assessment & Plan: History of this. Nystatin sent to pharmacy to have on hand in case she develops thrush.   Orders: -     Nystatin; Take 5 mLs (500,000 Units total) by mouth 4 (four) times daily.  Dispense: 473 mL; Refill: 0  History of iron deficiency -     CBC -     IBC + Ferritin    Return in about 3 months (around 04/13/2023).   Marikay Alar, MD [facility]

## 2023-01-12 NOTE — Patient Instructions (Signed)
Please try the astelin for the allergy symptoms.  We will let you know what your labs reveal.

## 2023-01-12 NOTE — Assessment & Plan Note (Addendum)
Chronic issue.  Seems to be back to her baseline after recent illness.  She will continue her Qvar 2 puffs twice daily and Bevespi 2 puffs twice daily.  Patient does report intermittently getting thrush with the use of her inhalers.  She does rinse her mouth appropriately.  She is requesting a refill on nystatin to use in case she gets thrush.

## 2023-01-12 NOTE — Assessment & Plan Note (Signed)
Chronic issue.  Adequately controlled on recheck.  She will continue metoprolol 25 mg daily, losartan 25 mg twice daily, and HCTZ 12.5 mg daily.  Check labs.

## 2023-01-12 NOTE — Assessment & Plan Note (Signed)
Chronic issue.  Seems to be very well-controlled.  She will continue metformin 1000 mg twice daily and Ozempic 0.5 mg weekly.  Check A1c today.

## 2023-01-12 NOTE — Assessment & Plan Note (Signed)
Chronic issue.  Symptoms seem worse at this point.  We will add Astelin nasal spray 2 sprays each nostril twice daily.  She will continue Flonase, Zyrtec, and Singulair.

## 2023-01-13 ENCOUNTER — Other Ambulatory Visit: Payer: Self-pay | Admitting: Cardiovascular Disease

## 2023-01-13 ENCOUNTER — Telehealth: Payer: Self-pay | Admitting: *Deleted

## 2023-01-13 ENCOUNTER — Other Ambulatory Visit: Payer: Self-pay | Admitting: Family Medicine

## 2023-01-13 DIAGNOSIS — M5432 Sciatica, left side: Secondary | ICD-10-CM | POA: Diagnosis not present

## 2023-01-13 DIAGNOSIS — M9904 Segmental and somatic dysfunction of sacral region: Secondary | ICD-10-CM | POA: Diagnosis not present

## 2023-01-13 DIAGNOSIS — J309 Allergic rhinitis, unspecified: Secondary | ICD-10-CM

## 2023-01-13 DIAGNOSIS — M955 Acquired deformity of pelvis: Secondary | ICD-10-CM | POA: Diagnosis not present

## 2023-01-13 DIAGNOSIS — M9905 Segmental and somatic dysfunction of pelvic region: Secondary | ICD-10-CM | POA: Diagnosis not present

## 2023-01-13 NOTE — Telephone Encounter (Signed)
-----   Message from [name], MD sent at 01/13/2023  9:24 AM EDT ----- Please let the patient know her B12 is minimally low.  This potentially could be contributing to her fatigue.  I would advise her to start on vitamin B12 1000 mcg once daily by mouth over-the-counter and have this rechecked in 3 months.  Her A1c is very well-controlled at 6.3.  Her other lab work is acceptable.

## 2023-01-13 NOTE — Telephone Encounter (Signed)
Left message to call office patient has 3 month follow up scheduled.

## 2023-01-13 NOTE — Telephone Encounter (Signed)
Pt called back and I read the message to her and she wanted to know what the B12 was so I transferred her to [facility]

## 2023-01-15 ENCOUNTER — Ambulatory Visit: Payer: Medicare Other

## 2023-01-17 DIAGNOSIS — J449 Chronic obstructive pulmonary disease, unspecified: Secondary | ICD-10-CM | POA: Diagnosis not present

## 2023-01-21 ENCOUNTER — Other Ambulatory Visit (HOSPITAL_COMMUNITY): Payer: Self-pay

## 2023-01-21 NOTE — Telephone Encounter (Signed)
This is a request for 100 day supply rx change, no PA needed. Please sign off as PA team is unable to sign off on Rx Request encounters. Thanks

## 2023-01-27 DIAGNOSIS — M955 Acquired deformity of pelvis: Secondary | ICD-10-CM | POA: Diagnosis not present

## 2023-01-27 DIAGNOSIS — M9905 Segmental and somatic dysfunction of pelvic region: Secondary | ICD-10-CM | POA: Diagnosis not present

## 2023-01-27 DIAGNOSIS — M9904 Segmental and somatic dysfunction of sacral region: Secondary | ICD-10-CM | POA: Diagnosis not present

## 2023-01-27 DIAGNOSIS — M5432 Sciatica, left side: Secondary | ICD-10-CM | POA: Diagnosis not present

## 2023-02-03 ENCOUNTER — Other Ambulatory Visit: Payer: Self-pay | Admitting: Family Medicine

## 2023-02-06 ENCOUNTER — Telehealth: Payer: Self-pay

## 2023-02-06 NOTE — Telephone Encounter (Signed)
Calleda nd informed pt that her patient assistance meds are availble for pick up:   Medication: Ozempic  QTY: 5 boxes   LOT: [id number] EXP: 07-29-24  Sig: inject 0.5 mg into skin weekly

## 2023-02-09 DIAGNOSIS — M9904 Segmental and somatic dysfunction of sacral region: Secondary | ICD-10-CM | POA: Diagnosis not present

## 2023-02-09 DIAGNOSIS — M955 Acquired deformity of pelvis: Secondary | ICD-10-CM | POA: Diagnosis not present

## 2023-02-09 DIAGNOSIS — M9905 Segmental and somatic dysfunction of pelvic region: Secondary | ICD-10-CM | POA: Diagnosis not present

## 2023-02-09 DIAGNOSIS — M5432 Sciatica, left side: Secondary | ICD-10-CM | POA: Diagnosis not present

## 2023-02-10 ENCOUNTER — Encounter: Payer: Self-pay | Admitting: Obstetrics and Gynecology

## 2023-02-11 ENCOUNTER — Ambulatory Visit: Payer: Medicare Other | Admitting: Pulmonary Disease

## 2023-02-13 NOTE — Patient Instructions (Signed)
Preventive Care 40-59 Years Old, Female Preventive care refers to lifestyle choices and visits with your health care provider that can promote health and wellness. Preventive care visits are also called wellness exams. What can I expect for my preventive care visit? Counseling Your health care provider may ask you questions about your: Medical history, including: Past medical problems. Family medical history. Pregnancy history. Current health, including: Menstrual cycle. Method of birth control. Emotional well-being. Home life and relationship well-being. Sexual activity and sexual health. Lifestyle, including: Alcohol, nicotine or tobacco, and drug use. Access to firearms. Diet, exercise, and sleep habits. Work and work environment. Sunscreen use. Safety issues such as seatbelt and bike helmet use. Physical exam Your health care provider will check your: Height and weight. These may be used to calculate your BMI (body mass index). BMI is a measurement that tells if you are at a healthy weight. Waist circumference. This measures the distance around your waistline. This measurement also tells if you are at a healthy weight and may help predict your risk of certain diseases, such as type 2 diabetes and high blood pressure. Heart rate and blood pressure. Body temperature. Skin for abnormal spots. What immunizations do I need?  Vaccines are usually given at various ages, according to a schedule. Your health care provider will recommend vaccines for you based on your age, medical history, and lifestyle or other factors, such as travel or where you work. What tests do I need? Screening Your health care provider may recommend screening tests for certain conditions. This may include: Lipid and cholesterol levels. Diabetes screening. This is done by checking your blood sugar (glucose) after you have not eaten for a while (fasting). Pelvic exam and Pap test. Hepatitis B test. Hepatitis C  test. HIV (human immunodeficiency virus) test. STI (sexually transmitted infection) testing, if you are at risk. Lung cancer screening. Colorectal cancer screening. Mammogram. Talk with your health care provider about when you should start having regular mammograms. This may depend on whether you have a family history of breast cancer. BRCA-related cancer screening. This may be done if you have a family history of breast, ovarian, tubal, or peritoneal cancers. Bone density scan. This is done to screen for osteoporosis. Talk with your health care provider about your test results, treatment options, and if necessary, the need for more tests. Follow these instructions at home: Eating and drinking  Eat a diet that includes fresh fruits and vegetables, whole grains, lean protein, and low-fat dairy products. Take vitamin and mineral supplements as recommended by your health care provider. Do not drink alcohol if: Your health care provider tells you not to drink. You are pregnant, may be pregnant, or are planning to become pregnant. If you drink alcohol: Limit how much you have to 0-1 drink a day. Know how much alcohol is in your drink. In the [place] one drink equals one 12 oz bottle of beer (355 mL), one 5 oz glass of wine (148 mL), or one 1 oz glass of hard liquor (44 mL). Lifestyle Brush your teeth every morning and night with fluoride toothpaste. Floss one time each day. Exercise for at least 30 minutes 5 or more days each week. Do not use any products that contain nicotine or tobacco. These products include cigarettes, chewing tobacco, and vaping devices, such as e-cigarettes. If you need help quitting, ask your health care provider. Do not use drugs. If you are sexually active, practice safe sex. Use a condom or other form of protection to   prevent STIs. If you do not wish to become pregnant, use a form of birth control. If you plan to become pregnant, see your health care provider for a  prepregnancy visit. Take aspirin only as told by your health care provider. Make sure that you understand how much to take and what form to take. Work with your health care provider to find out whether it is safe and beneficial for you to take aspirin daily. Find healthy ways to manage stress, such as: Meditation, yoga, or listening to music. Journaling. Talking to a trusted person. Spending time with friends and family. Minimize exposure to UV radiation to reduce your risk of skin cancer. Safety Always wear your seat belt while driving or riding in a vehicle. Do not drive: If you have been drinking alcohol. Do not ride with someone who has been drinking. When you are tired or distracted. While texting. If you have been using any mind-altering substances or drugs. Wear a helmet and other protective equipment during sports activities. If you have firearms in your house, make sure you follow all gun safety procedures. Seek help if you have been physically or sexually abused. What's next? Visit your health care provider once a year for an annual wellness visit. Ask your health care provider how often you should have your eyes and teeth checked. Stay up to date on all vaccines. This information is not intended to replace advice given to you by your health care provider. Make sure you discuss any questions you have with your health care provider. Document Revised: 03/13/2021 Document Reviewed: 03/13/2021 Elsevier Patient Education  2023 [employer] Breast Self-Awareness Breast self-awareness is knowing how your breasts look and feel. You need to: Check your breasts on a regular basis. Tell your doctor about any changes. Become familiar with the look and feel of your breasts. This can help you catch a breast problem while it is still small and can be treated. You should do breast self-exams even if you have breast implants. What you need: A mirror. A well-lit room. A pillow or other  soft object. How to do a breast self-exam Follow these steps to do a breast self-exam: Look for changes  Take off all the clothes above your waist. Stand in front of a mirror in a room with good lighting. Put your hands down at your sides. Compare your breasts in the mirror. Look for any difference between them, such as: A difference in shape. A difference in size. Wrinkles, dips, and bumps in one breast and not the other. Look at each breast for changes in the skin, such as: Redness. Scaly areas. Skin that has gotten thicker. Dimpling. Open sores (ulcers). Look for changes in your nipples, such as: Fluid coming out of a nipple. Fluid around a nipple. Bleeding. Dimpling. Redness. A nipple that looks pushed in (retracted), or that has changed position. Feel for changes Lie on your back. Feel each breast. To do this: Pick a breast to feel. Place a pillow under the shoulder closest to that breast. Put the arm closest to that breast behind your head. Feel the nipple area of that breast using the hand of your other arm. Feel the area with the pads of your three middle fingers by making small circles with your fingers. Use light, medium, and firm pressure. Continue the overlapping circles, moving downward over the breast. Keep making circles with your fingers. Stop when you feel your ribs. Start making circles with your fingers again, this time going   upward until you reach your collarbone. Then, make circles outward across your breast and into your armpit area. Squeeze your nipple. Check for discharge and lumps. Repeat these steps to check your other breast. Sit or stand in the tub or shower. With soapy water on your skin, feel each breast the same way you did when you were lying down. Write down what you find Writing down what you find can help you remember what to tell your doctor. Write down: What is normal for each breast. Any changes you find in each breast. These  include: The kind of changes you find. A tender or painful breast. Any lump you find. Write down its size and where it is. When you last had your monthly period (menstrual cycle). General tips If you are breastfeeding, the best time to check your breasts is after you feed your baby or after you use a breast pump. If you get monthly bleeding, the best time to check your breasts is 5-7 days after your monthly cycle ends. With time, you will become comfortable with the self-exam. You will also start to know if there are changes in your breasts. Contact a doctor if: You see a change in the shape or size of your breasts or nipples. You see a change in the skin of your breast or nipples, such as red or scaly skin. You have fluid coming from your nipples that is not normal. You find a new lump or thick area. You have breast pain. You have any concerns about your breast health. Summary Breast self-awareness includes looking for changes in your breasts and feeling for changes within your breasts. You should do breast self-awareness in front of a mirror in a well-lit room. If you get monthly periods (menstrual cycles), the best time to check your breasts is 5-7 days after your period ends. Tell your doctor about any changes you see in your breasts. Changes include changes in size, changes on the skin, painful or tender breasts, or fluid from your nipples that is not normal. This information is not intended to replace advice given to you by your health care provider. Make sure you discuss any questions you have with your health care provider. Document Revised: 02/20/2022 Document Reviewed: 07/18/2021 Elsevier Patient Education  2023 [employer]  

## 2023-02-13 NOTE — Progress Notes (Unsigned)
ANNUAL PREVENTATIVE CARE GYNECOLOGY  ENCOUNTER NOTE  Subjective:       Martha Woodard is a 57 y.o. [id number] female here for a routine annual gynecologic exam. The patient is not sexually active. The patient is not taking hormone replacement therapy. Patient denies post-menopausal vaginal bleeding. The patient wears seatbelts: yes. The patient participates in regular exercise: yes, patient states that she walks. Has the patient ever been transfused or tattooed?: yes. The patient reports that there is not domestic violence in her life.    Current complaints: 1.  None    Gynecologic History Patient's last menstrual period was 04/10/2014. Contraception: post menopausal status Last Pap: 02/05/2021. Results were: normal. Denies history of abnormal pap smears.  Last mammogram: 04/30/2022. Results were: normal Last Colonoscopy: 01/01/2016: Repeat in 10 years Last Dexa Scan: Not age appropriate   Obstetric History OB History  Gravida Para Term Preterm AB Living  1 0     1    SAB IAB Ectopic Multiple Live Births  1            # Outcome Date GA Lbr Len/2nd Weight Sex Delivery Anes PTL Lv  1 SAB             Obstetric Comments  1st Menstrual Cycle:  11  1st Pregnancy: 18    Past Medical History:  Diagnosis Date   CHF (congestive heart failure) (HCC)    Colon polyp 01-01-2016   COPD (chronic obstructive pulmonary disease) (HCC)    Diabetes mellitus without complication (HCC)    Diastolic heart failure (HCC)    HLD (hyperlipidemia)    Hypertension    Morbid obesity with BMI of 45.0-49.9, adult (HCC)    Multiple lung nodules on CT    Shortness of breath dyspnea     Family History  Problem Relation Age of Onset   Cancer Mother 23       uterian and ovarian    Goiter Mother    Stroke Father    Breast cancer Sister 69   Stroke Brother    Breast cancer Sister 36   Heart murmur Sister     Past Surgical History:  Procedure Laterality Date   BIOPSY THYROID Right 02-06-14    NEGATIVE FOR MALIGNANT CELLS proteinaceous material and macrophages.   BREAST BIOPSY Left    neg- core   COLONOSCOPY WITH PROPOFOL N/A 01/01/2016   Procedure: COLONOSCOPY WITH PROPOFOL;  Surgeon: [name], MD;  Location: [facility] ENDOSCOPY;  Service: Endoscopy;  Laterality: N/A;   thryoid fna Right April 2015   Proteinaceous material and macrophages.   WISDOM TOOTH EXTRACTION      Social History   Socioeconomic History   Marital status: Single    Spouse name: Not on file   Number of children: Not on file   Years of education: Not on file   Highest education level: Not on file  Occupational History   Not on file  Tobacco Use   Smoking status: Former    Packs/day: 3.00    Years: 34.00    Additional pack years: 0.00    Total pack years: 102.00    Types: Cigarettes    Quit date: 07/10/2013    Years since quitting: 9.6   Smokeless tobacco: Never  Vaping Use   Vaping Use: Former  Substance and Sexual Activity   Alcohol use: No    Alcohol/week: 0.0 standard drinks of alcohol   Drug use: No   Sexual activity: Not Currently  Other  Topics Concern   Not on file  Social History Narrative   Not on file   Social Determinants of Health   Financial Resource Strain: Low Risk  (08/14/2022)   Overall Financial Resource Strain (CARDIA)    Difficulty of Paying Living Expenses: Not very hard  Food Insecurity: No Food Insecurity (09/19/2022)   Hunger Vital Sign    Worried About Running Out of Food in the Last Year: Never true    Ran Out of Food in the Last Year: Never true  Transportation Needs: No Transportation Needs (09/19/2022)   PRAPARE - Administrator, Civil Service (Medical): No    Lack of Transportation (Non-Medical): No  Physical Activity: Insufficiently Active (08/14/2022)   Exercise Vital Sign    Days of Exercise per Week: 7 days    Minutes of Exercise per Session: 20 min  Stress: No Stress Concern Present (08/14/2022)   [employer] of Occupational  Health - Occupational Stress Questionnaire    Feeling of Stress : Not at all  Social Connections: Socially Isolated (08/14/2022)   Social Connection and Isolation Panel [NHANES]    Frequency of Communication with Friends and Family: More than three times a week    Frequency of Social Gatherings with Friends and Family: Twice a week    Attends Religious Services: Never    Database administrator or Organizations: No    Attends Banker Meetings: Never    Marital Status: Divorced  Catering manager Violence: Not At Risk (09/16/2022)   Humiliation, Afraid, Rape, and Kick questionnaire    Fear of Current or Ex-Partner: No    Emotionally Abused: No    Physically Abused: No    Sexually Abused: No    Current Outpatient Medications on File Prior to Visit  Medication Sig Dispense Refill   ACCU-CHEK AVIVA PLUS test strip TEST UP TO 4 TIMES DAILY AS DIRECTED 400 strip 3   Accu-Chek Softclix Lancets lancets TEST UP TO 4 TIMES DAILY 400 each 3   acetaminophen (TYLENOL) 650 MG CR tablet Take 650 mg by mouth every 8 (eight) hours as needed for pain.     albuterol (PROVENTIL) (2.5 MG/3ML) 0.083% nebulizer solution Take 3 mLs (2.5 mg total) by nebulization every 6 (six) hours as needed for wheezing or shortness of breath. 75 mL 12   aspirin EC 81 MG tablet Take 1 tablet (81 mg total) by mouth daily. 90 tablet 3   azelastine (ASTELIN) 0.1 % nasal spray Place 2 sprays into both nostrils 2 (two) times daily. Use in each nostril as directed 30 mL 12   beclomethasone (QVAR REDIHALER) 80 MCG/ACT inhaler Inhale 2 puffs into the lungs 2 (two) times daily. 10.6 g 11   blood glucose meter kit and supplies Dispense based on patient and insurance preference. Use up to four times daily as directed. (FOR ICD-9 250.00, 250.01). 1 each 0   calcium-vitamin D (OSCAL WITH D) 500-200 MG-UNIT tablet Take 1 tablet by mouth 2 (two) times daily.     cetirizine (ZYRTEC) 10 MG tablet Take 10 mg by mouth daily.      fluticasone (FLONASE) 50 MCG/ACT nasal spray USE 1 OR 2 SPRAYS IN EACH NOSTRIL ONCE DAILY. 16 g 12   furosemide (LASIX) 20 MG tablet Take 1 tablet (20 mg total) by mouth daily. 90 tablet 3   gabapentin (NEURONTIN) 100 MG capsule TAKE 1 CAPSULE BY MOUTH 3 TIMES  DAILY 270 capsule 3   Glycopyrrolate-Formoterol (BEVESPI AEROSPHERE) 9-4.8 MCG/ACT  AERO Inhale 2 puffs into the lungs 2 (two) times daily. 10.7 g 11   hydrochlorothiazide (MICROZIDE) 12.5 MG capsule Take 1 capsule (12.5 mg total) by mouth daily. 90 capsule 3   losartan (COZAAR) 25 MG tablet Take 1 tablet (25 mg total) by mouth 2 (two) times daily for 25 days. 180 tablet 3   magnesium oxide (MAG-OX) 400 MG tablet Take 400 mg by mouth daily.     metFORMIN (GLUCOPHAGE) 500 MG tablet TAKE 2 TABLETS BY MOUTH TWICE  DAILY WITH MEALS 360 tablet 3   metoprolol succinate (TOPROL-XL) 25 MG 24 hr tablet Take 1 tablet (25 mg total) by mouth daily. 90 tablet 3   montelukast (SINGULAIR) 10 MG tablet TAKE 1 TABLET BY MOUTH AT  BEDTIME 90 tablet 3   Multiple Vitamin (MULTIVITAMIN) tablet Take 1 tablet by mouth daily.     nystatin (MYCOSTATIN) 100000 UNIT/ML suspension Take 5 mLs (500,000 Units total) by mouth 4 (four) times daily. 473 mL 0   ondansetron (ZOFRAN) 4 MG tablet Take 1 tablet (4 mg total) by mouth every 8 (eight) hours as needed for nausea or vomiting. 20 tablet 0   potassium chloride (KLOR-CON) 10 MEQ tablet Take 1 tablet (10 mEq total) by mouth 2 (two) times daily. 180 tablet 3   PROVENTIL HFA 108 (90 Base) MCG/ACT inhaler Inhale 2 puffs into the lungs every 6 (six) hours as needed for wheezing or shortness of breath. 18 gram inhaler 3 each 1   rosuvastatin (CRESTOR) 20 MG tablet Take 1 tablet (20 mg total) by mouth daily. 90 tablet 3   Semaglutide,0.25 or 0.5MG /DOS, (OZEMPIC, 0.25 OR 0.5 MG/DOSE,) 2 MG/3ML SOPN Inject 0.25 mg weekly for 4 weeks, then increase to 0.5 mg weekly 3 mL 2   sodium chloride (OCEAN) 0.65 % SOLN nasal spray Place 2  sprays into both nostrils as needed for congestion. 30 mL 11   vitamin C (ASCORBIC ACID) 500 MG tablet Take 1,000 mg by mouth daily.      Vitamin E 180 MG CAPS Take 180 mg by mouth daily.     No current facility-administered medications on file prior to visit.    Allergies  Allergen Reactions   Augmentin [Amoxicillin-Pot Clavulanate] Itching    Hives Can take PCN and Amoxicillin alone       Review of Systems ROS Review of Systems - General ROS: negative for - chills, fatigue, fever, hot flashes, night sweats, weight gain or weight loss Psychological ROS: negative for - anxiety, decreased libido, depression, mood swings, physical abuse or sexual abuse Ophthalmic ROS: negative for - blurry vision, eye pain or loss of vision ENT ROS: negative for - headaches, hearing change, visual changes or vocal changes Allergy and Immunology ROS: negative for - hives, itchy/watery eyes or seasonal allergies Hematological and Lymphatic ROS: negative for - bleeding problems, bruising, swollen lymph nodes or weight loss Endocrine ROS: negative for - galactorrhea, hair pattern changes, hot flashes, malaise/lethargy, mood swings, palpitations, polydipsia/polyuria, skin changes, temperature intolerance or unexpected weight changes Breast ROS: negative for - new or changing breast lumps or nipple discharge Respiratory ROS: negative for - cough or shortness of breath Cardiovascular ROS: negative for - chest pain, irregular heartbeat, palpitations or shortness of breath Gastrointestinal ROS: no abdominal pain, change in bowel habits, or black or bloody stools Genito-Urinary ROS: no dysuria, trouble voiding, or hematuria Musculoskeletal ROS: negative for - joint pain or joint stiffness Neurological ROS: negative for - bowel and bladder control changes Dermatological ROS:  negative for rash and skin lesion changes   Objective:   BP (!) 143/76   Pulse 82   Resp 16   Ht 5\' 6"  (1.676 m)   Wt 250 lb 14.4 oz  (113.8 kg)   LMP 04/10/2014   BMI 40.50 kg/m  CONSTITUTIONAL: Well-developed, well-nourished female in no acute distress.  PSYCHIATRIC: Normal mood and affect. Normal behavior. Normal judgment and thought content. NEUROLGIC: Alert and oriented to person, place, and time. Normal muscle tone coordination. No cranial nerve deficit noted. HENT:  Normocephalic, atraumatic, External right and left ear normal. Oropharynx is clear and moist EYES: Conjunctivae and EOM are normal. Pupils are equal, round, and reactive to light. No scleral icterus.  NECK: Normal range of motion, supple, no masses.  Normal thyroid.  SKIN: Skin is warm and dry. Hyperemic macular rash noted beneath breasts, and in groin folds and pannus region.  Not diaphoretic. No erythema. No pallor. CARDIOVASCULAR: Normal heart rate noted, regular rhythm, no murmur. RESPIRATORY: Clear to auscultation bilaterally. Effort and breath sounds normal, no problems with respiration noted. BREASTS: Symmetric in size. No masses, skin changes, nipple drainage, or lymphadenopathy. ABDOMEN: Soft, normal bowel sounds, no distention noted.  No tenderness, rebound or guarding.  BLADDER: Normal PELVIC:  Bladder no bladder distension noted  Urethra: normal appearing urethra with no masses, tenderness or lesions  Vulva: normal appearing vulva with no masses, tenderness or lesions  Vagina: normal appearing vagina with normal color and discharge, no lesions  Cervix: normal appearing cervix without discharge or lesions  Uterus: uterus is normal size, shape, consistency and nontender  Adnexa: normal adnexa in size, nontender and no masses  RV: External Exam NormaI, No Rectal Masses, and Normal Sphincter tone  MUSCULOSKELETAL: Normal range of motion. No tenderness.  No cyanosis, clubbing, or edema.  2+ distal pulses. LYMPHATIC: No Axillary, Supraclavicular, or Inguinal Adenopathy.   Labs: Lab Results  Component Value Date   WBC 6.3 01/12/2023   HGB  14.1 01/12/2023   HCT 42.7 01/12/2023   MCV 84.1 01/12/2023   PLT 222.0 01/12/2023    Lab Results  Component Value Date   CREATININE 0.54 01/12/2023   BUN 11 01/12/2023   NA 143 01/12/2023   K 4.3 01/12/2023   CL 100 01/12/2023   CO2 32 01/12/2023    Lab Results  Component Value Date   ALT 26 01/12/2023   AST 23 01/12/2023   ALKPHOS 48 01/12/2023   BILITOT 0.3 01/12/2023    Lab Results  Component Value Date   CHOL 109 11/26/2021   HDL 45.90 11/26/2021   LDLCALC 40 11/26/2021   LDLDIRECT 57.0 07/27/2018   TRIG 114.0 11/26/2021   CHOLHDL 2 11/26/2021    Lab Results  Component Value Date   TSH 0.91 01/12/2023    Lab Results  Component Value Date   HGBA1C 6.3 01/12/2023     Assessment:   1. Well woman exam with routine gynecological exam   2. Essential hypertension   3. Type 2 diabetes mellitus with other circulatory complication, without long-term current use of insulin (HCC)   4. Yeast infection of the skin   5. Breast cancer screening by mammogram      Plan:  - Pap:  UTD - Mammogram: Ordered. - Colon Screening:   UTD - Labs:  UTD - Routine preventative health maintenance measures emphasized:  Self Breast Exams and Exercise/Diet/Weight control - Patient with chronic yeast dermatitis. Notes that Nystatin cream sometimes irritates her and the powder does not  seem to be effective. Will prescribe Diflucan oral therapy x 2 weeks. Advised on use of cornstarch to affected areas to decrease moisture.  - HTN and DM managed by PCP.  - Return to Clinic - 1 Year   Hildred Laser, MD [facility]

## 2023-02-15 NOTE — Progress Notes (Unsigned)
Date:  02/16/2023   ID:  Martha, Woodard March 07, 1966, MRN [id number]  Patient Location:  [address] Martha Woodard [place] [address]   Provider location:   [facility], [place] office  PCP:  Martha Luis, MD  Cardiologist:  Martha Woodard  Chief Complaint  Patient presents with   12 month follow up     "Doing well." Medications reviewed by the patient verbally.     History of Present Illness:    Martha Woodard is a 57 y.o. female  past medical history of diabetes,  asthma,  COPD, Quit smoking 2014, smoked for >30 yrs lung nodules  hospital for acute respiratory distress, 20 pound weight gain, hypoxia, diagnosed with COPD exacerbation and acute diastolic CHF  Chronic LE edema>L obesity hypoventilation Ejection fraction 60% in December 2016 She presents for follow-up of her diastolic CHF, coronary calcification on CT scan  Last seen by myself in clinic 5/23  COPD exacerbation dec 2023, into jan 2024 Flu positive A ABX, prednisone Has made full recovery  On ozempic, weight slowly trending downward  Lab work reviewed A1C 6.3, down from 7.5  No regular exercise program Denies significant chest pain, no leg edema no PND orthopnea Takes HCTZ and Lasix daily  Blood pressure relatively well-controlled  Chest CT January 21, 2022, coronary calcification noted Very mild coronary calcification Very mild aortic atherosclerosis   stopped smoking 06/2013  EKG personally reviewed by myself on todays visit Normal sinus rhythm rate 80 bpm no significant ST-T wave changes  Other past medical hx COVID-19 + December 2020 Sore throat, coughing, some shortness of breath, had to use oxygen at times. Felt like bronchiits Sinus infection has persisted No fever currently PMD called prednisone, may need abx  Prior episode of hypoxic respiratory distress Previous CT chest  Bilateral lower lobe atelectasis, pulmonary edema versus interstitial  pneumonitis superimposed on changes of COPD and right lung nodules    initiated on steroids, antibiotics, inhalation therapy as well as diuretics.     hypoxic intermittently, especially at nighttime and sitting in the bed, concerning for obesity hypoventilation syndrome.     Past Medical History:  Diagnosis Date   CHF (congestive heart failure) (HCC)    Colon polyp 01-01-2016   COPD (chronic obstructive pulmonary disease) (HCC)    Diabetes mellitus without complication (HCC)    Diastolic heart failure (HCC)    HLD (hyperlipidemia)    Hypertension    Morbid obesity with BMI of 45.0-49.9, adult (HCC)    Multiple lung nodules on CT    Shortness of breath dyspnea    Past Surgical History:  Procedure Laterality Date   BIOPSY THYROID Right 02-06-14   NEGATIVE FOR MALIGNANT CELLS proteinaceous material and macrophages.   BREAST BIOPSY Left    neg- core   COLONOSCOPY WITH PROPOFOL N/A 01/01/2016   Procedure: COLONOSCOPY WITH PROPOFOL;  Surgeon: [name], MD;  Location: [facility] ENDOSCOPY;  Service: Endoscopy;  Laterality: N/A;   thryoid fna Right April 2015   Proteinaceous material and macrophages.   WISDOM TOOTH EXTRACTION      Allergies:   Augmentin [amoxicillin-pot clavulanate]   Social History   Tobacco Use   Smoking status: Former    Packs/day: 3.00    Years: 34.00    Additional pack years: 0.00    Total pack years: 102.00    Types: Cigarettes    Quit date: 07/10/2013    Years since quitting: 9.6  Smokeless tobacco: Never  Vaping Use   Vaping Use: Former  Substance Use Topics   Alcohol use: No    Alcohol/week: 0.0 standard drinks of alcohol   Drug use: No     Current Outpatient Medications on File Prior to Visit  Medication Sig Dispense Refill   ACCU-CHEK AVIVA PLUS test strip TEST UP TO 4 TIMES DAILY AS DIRECTED 400 strip 3   Accu-Chek Softclix Lancets lancets TEST UP TO 4 TIMES DAILY 400 each 3   acetaminophen (TYLENOL) 650 MG CR tablet Take 650 mg by mouth every  8 (eight) hours as needed for pain.     albuterol (PROVENTIL) (2.5 MG/3ML) 0.083% nebulizer solution Take 3 mLs (2.5 mg total) by nebulization every 6 (six) hours as needed for wheezing or shortness of breath. 75 mL 12   aspirin EC 81 MG tablet Take 1 tablet (81 mg total) by mouth daily. 90 tablet 3   azelastine (ASTELIN) 0.1 % nasal spray Place 2 sprays into both nostrils 2 (two) times daily. Use in each nostril as directed 30 mL 12   beclomethasone (QVAR REDIHALER) 80 MCG/ACT inhaler Inhale 2 puffs into the lungs 2 (two) times daily. 10.6 g 11   blood glucose meter kit and supplies Dispense based on patient and insurance preference. Use up to four times daily as directed. (FOR ICD-9 250.00, 250.01). 1 each 0   calcium-vitamin D (OSCAL WITH D) 500-200 MG-UNIT tablet Take 1 tablet by mouth 2 (two) times daily.     cetirizine (ZYRTEC) 10 MG tablet Take 10 mg by mouth daily.     fluticasone (FLONASE) 50 MCG/ACT nasal spray USE 1 OR 2 SPRAYS IN EACH NOSTRIL ONCE DAILY. 16 g 12   furosemide (LASIX) 20 MG tablet TAKE 1 TABLET BY MOUTH DAILY 90 tablet 0   gabapentin (NEURONTIN) 100 MG capsule TAKE 1 CAPSULE BY MOUTH 3 TIMES  DAILY 270 capsule 3   Glycopyrrolate-Formoterol (BEVESPI AEROSPHERE) 9-4.8 MCG/ACT AERO Inhale 2 puffs into the lungs 2 (two) times daily. 10.7 g 11   hydrochlorothiazide (MICROZIDE) 12.5 MG capsule TAKE 1 CAPSULE BY MOUTH DAILY 90 capsule 0   losartan (COZAAR) 25 MG tablet TAKE 1 TABLET BY MOUTH TWICE  DAILY 180 tablet 0   magnesium oxide (MAG-OX) 400 MG tablet Take 400 mg by mouth daily.     metFORMIN (GLUCOPHAGE) 500 MG tablet TAKE 2 TABLETS BY MOUTH TWICE  DAILY WITH MEALS 360 tablet 3   metoprolol succinate (TOPROL-XL) 25 MG 24 hr tablet TAKE 1 TABLET BY MOUTH ONCE  DAILY 90 tablet 0   montelukast (SINGULAIR) 10 MG tablet TAKE 1 TABLET BY MOUTH AT  BEDTIME 90 tablet 3   Multiple Vitamin (MULTIVITAMIN) tablet Take 1 tablet by mouth daily.     nystatin (MYCOSTATIN) 100000 UNIT/ML  suspension Take 5 mLs (500,000 Units total) by mouth 4 (four) times daily. 473 mL 0   ondansetron (ZOFRAN) 4 MG tablet Take 1 tablet (4 mg total) by mouth every 8 (eight) hours as needed for nausea or vomiting. 20 tablet 0   potassium chloride (KLOR-CON) 10 MEQ tablet TAKE 1 TABLET BY MOUTH TWICE  DAILY 180 tablet 3   PROVENTIL HFA 108 (90 Base) MCG/ACT inhaler Inhale 2 puffs into the lungs every 6 (six) hours as needed for wheezing or shortness of breath. 18 gram inhaler 3 each 1   rosuvastatin (CRESTOR) 20 MG tablet TAKE 1 TABLET BY MOUTH DAILY 90 tablet 0   Semaglutide,0.25 or 0.5MG /DOS, (OZEMPIC, 0.25 OR  0.5 MG/DOSE,) 2 MG/3ML SOPN Inject 0.25 mg weekly for 4 weeks, then increase to 0.5 mg weekly 3 mL 2   sodium chloride (OCEAN) 0.65 % SOLN nasal spray Place 2 sprays into both nostrils as needed for congestion. 30 mL 11   vitamin C (ASCORBIC ACID) 500 MG tablet Take 1,000 mg by mouth daily.      Vitamin E 180 MG CAPS Take 180 mg by mouth daily.     No current facility-administered medications on file prior to visit.     Family Hx: The patient's family history includes Breast cancer (age of onset: 58) in her sister; Breast cancer (age of onset: 72) in her sister; Cancer (age of onset: 72) in her mother; Goiter in her mother; Heart murmur in her sister; Stroke in her brother and father.  ROS:   Please see the history of present illness.    Review of Systems  Constitutional: Negative.   HENT: Negative.    Respiratory:  Positive for shortness of breath.   Cardiovascular: Negative.   Gastrointestinal: Negative.   Musculoskeletal: Negative.   Neurological: Negative.   Psychiatric/Behavioral: Negative.    All other systems reviewed and are negative.    Labs/Other Tests and Data Reviewed:    Recent Labs: 09/17/2022: Magnesium 2.2 01/12/2023: ALT 26; BUN 11; Creatinine, Ser 0.54; Hemoglobin 14.1; Platelets 222.0; Potassium 4.3; Sodium 143; TSH 0.91   Recent Lipid Panel Lab Results   Component Value Date/Time   CHOL 109 11/26/2021 09:55 AM   TRIG 114.0 11/26/2021 09:55 AM   HDL 45.90 11/26/2021 09:55 AM   CHOLHDL 2 11/26/2021 09:55 AM   LDLCALC 40 11/26/2021 09:55 AM   LDLDIRECT 57.0 07/27/2018 09:20 AM    Wt Readings from Last 3 Encounters:  02/16/23 250 lb (113.4 kg)  02/16/23 250 lb 6.4 oz (113.6 kg)  01/12/23 247 lb (112 kg)     Exam:    Vital Signs: Vital signs may also be detailed in the HPI BP (!) 140/70 (BP Location: Left Arm, Patient Position: Sitting, Cuff Size: Large)   Pulse 80   Ht 5\' 6"  (1.676 m)   Wt 250 lb (113.4 kg)   LMP 04/10/2014   SpO2 95% Comment: .5 L O2  BMI 40.35 kg/m   Constitutional:  oriented to person, place, and time. No distress.  HENT:  Head: Grossly normal Eyes:  no discharge. No scleral icterus.  Neck: No JVD, no carotid bruits  Cardiovascular: Regular rate and rhythm, no murmurs appreciated Pulmonary/Chest: Clear to auscultation bilaterally, no wheezes or rails Abdominal: Soft.  no distension.  no tenderness.  Musculoskeletal: Normal range of motion Neurological:  normal muscle tone. Coordination normal. 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)   Relevant Orders   EKG 12-Lead   Obesity hypoventilation syndrome (HCC)   Other Visit Diagnoses     Coronary artery calcification seen on CT scan       Relevant Orders   EKG 12-Lead   PAC (premature atrial contraction)  Coronary calcification Noted on CT chest, mild in nature,  A1c improving, cholesterol at  goal, continue Crestor 20 daily current non-smoker  COPD: Severe symptoms December 2023 into January 2024 Multiple rounds of antibiotic and steroids Stopped smoking many years ago,  Remains on chronic oxygen when she ambulates outside the house and at night Followed by pulmonary Feels back to her baseline  Obesity hypoventilation: On Ozempic, slow weight loss  Diastolic CHF Taking Lasix with HCTZ, BMP stable Denies leg swelling, PND orthopnea  Diabetes:  HBA1C 6.3 Numbers dramatically improved over the past 6 months Reports that she stopped drinking soda   Total encounter time more than 30 minutes  Greater than 50% was spent in counseling and coordination of care with the patient   Signed, Julien Nordmann, MD  [facility] [address], [place], [place] [address]

## 2023-02-16 ENCOUNTER — Ambulatory Visit: Payer: Medicare Other | Admitting: Pulmonary Disease

## 2023-02-16 ENCOUNTER — Encounter: Payer: Self-pay | Admitting: Pulmonary Disease

## 2023-02-16 ENCOUNTER — Ambulatory Visit: Payer: Medicare Other | Attending: Cardiovascular Disease | Admitting: Cardiovascular Disease

## 2023-02-16 ENCOUNTER — Encounter: Payer: Self-pay | Admitting: Cardiovascular Disease

## 2023-02-16 VITALS — BP 140/70 | HR 80 | Ht 66.0 in | Wt 250.0 lb

## 2023-02-16 VITALS — BP 132/70 | HR 87 | Temp 98.1°F | Ht 66.0 in | Wt 250.4 lb

## 2023-02-16 DIAGNOSIS — E662 Morbid (severe) obesity with alveolar hypoventilation: Secondary | ICD-10-CM

## 2023-02-16 DIAGNOSIS — I5032 Chronic diastolic (congestive) heart failure: Secondary | ICD-10-CM

## 2023-02-16 DIAGNOSIS — R918 Other nonspecific abnormal finding of lung field: Secondary | ICD-10-CM | POA: Diagnosis not present

## 2023-02-16 DIAGNOSIS — J449 Chronic obstructive pulmonary disease, unspecified: Secondary | ICD-10-CM | POA: Diagnosis not present

## 2023-02-16 DIAGNOSIS — E782 Mixed hyperlipidemia: Secondary | ICD-10-CM | POA: Diagnosis not present

## 2023-02-16 DIAGNOSIS — J9611 Chronic respiratory failure with hypoxia: Secondary | ICD-10-CM

## 2023-02-16 DIAGNOSIS — I10 Essential (primary) hypertension: Secondary | ICD-10-CM

## 2023-02-16 DIAGNOSIS — Z7984 Long term (current) use of oral hypoglycemic drugs: Secondary | ICD-10-CM | POA: Diagnosis not present

## 2023-02-16 DIAGNOSIS — E1159 Type 2 diabetes mellitus with other circulatory complications: Secondary | ICD-10-CM

## 2023-02-16 DIAGNOSIS — I491 Atrial premature depolarization: Secondary | ICD-10-CM | POA: Diagnosis not present

## 2023-02-16 DIAGNOSIS — J4489 Other specified chronic obstructive pulmonary disease: Secondary | ICD-10-CM

## 2023-02-16 DIAGNOSIS — J9612 Chronic respiratory failure with hypercapnia: Secondary | ICD-10-CM

## 2023-02-16 DIAGNOSIS — I2510 Atherosclerotic heart disease of native coronary artery without angina pectoris: Secondary | ICD-10-CM | POA: Diagnosis not present

## 2023-02-16 DIAGNOSIS — J432 Centrilobular emphysema: Secondary | ICD-10-CM

## 2023-02-16 MED ORDER — FUROSEMIDE 20 MG PO TABS: 20.0000 mg | ORAL_TABLET | Freq: Every day | ORAL | 3 refills | Status: DC

## 2023-02-16 MED ORDER — POTASSIUM CHLORIDE ER 10 MEQ PO TBCR: 10.0000 meq | EXTENDED_RELEASE_TABLET | Freq: Two times a day (BID) | ORAL | 3 refills | Status: DC

## 2023-02-16 MED ORDER — LOSARTAN POTASSIUM 25 MG PO TABS: 25.0000 mg | ORAL_TABLET | Freq: Two times a day (BID) | ORAL | 3 refills | Status: DC

## 2023-02-16 MED ORDER — ROSUVASTATIN CALCIUM 20 MG PO TABS: 20.0000 mg | ORAL_TABLET | Freq: Every day | ORAL | 3 refills | Status: DC

## 2023-02-16 MED ORDER — METOPROLOL SUCCINATE ER 25 MG PO TB24
25.0000 mg | ORAL_TABLET | Freq: Every day | ORAL | 3 refills | Status: DC
Start: 2023-02-16 — End: 2023-11-30

## 2023-02-16 MED ORDER — HYDROCHLOROTHIAZIDE 12.5 MG PO CAPS: 12.5000 mg | ORAL_CAPSULE | Freq: Every day | ORAL | 3 refills | Status: DC

## 2023-02-16 NOTE — Patient Instructions (Signed)
Your lungs sounded clear today.  Your oxygen level was also good on your supplemental oxygen.  We will see him in follow-up in 3 months time call sooner should any new problems arise.

## 2023-02-16 NOTE — Patient Instructions (Signed)
Medication Instructions:  No changes  If you need a refill on your cardiac medications before your next appointment, please call your pharmacy.   Lab work: No new labs needed  Testing/Procedures: No new testing needed  Follow-Up: At [facility], you and your health needs are our priority.  As part of our continuing mission to provide you with exceptional heart care, we have created designated Provider Care Teams.  These Care Teams include your primary Cardiologist (physician) and Advanced Practice Providers (APPs -  Physician Assistants and Nurse Practitioners) who all work together to provide you with the care you need, when you need it.  You will need a follow up appointment in 12 months  Providers on your designated Care Team:   Christopher Berge, NP Ryan Dunn, PA-C Cadence Furth, PA-C  COVID-19 Vaccine Information can be found at: [email or web address] For questions related to vaccine distribution or appointments, please email [email or web address] or call [phone].   

## 2023-02-16 NOTE — Progress Notes (Signed)
Subjective:    Patient ID: Martha Woodard, female    DOB: 1965-11-29, 57 y.o.   MRN: [id number] Patient Care Team: [name], MD as PCP - General (Family Medicine) Lemar Livings, Merrily Pew, MD (General Surgery) Antonieta Iba, MD as Consulting Physician (Cardiology)  Chief Complaint  Patient presents with   Follow-up    SOB with exertion. Occasional wheezing. Cough with yellow or white sputum.   PROBLEMS: Chronic hypoxic/hypercarbic respiratory failure Former smoker Moderate/severe COPD [facility] CHF Multiple pulmonary nodules   DATA: 09/28/2015 2D echo: EF 60 to 65%, grade 1 DD, mild left atrial dilation.  Right-sided pressures normal. 02/28/2020 PFTs:FEV1 1.42L or 49%, FVC or 2.24L 60%, FEV1/FVC 63%, no bronchodilator response.  RV is 122% indicating air trapping.  Diffusion capacity normal.  Consistent with moderate to severe COPD. 04/05/2020 chest x-ray: No active disease.  Consistent with COPD, left base scarring/atelectasis. 01/21/2022 LDCT: Mild to moderate centrilobular emphysema.  Multiple bilateral pulmonary nodules of which the larger were present on 01/17/2016 exam.  Some new nodules from 2017 exam.  Coronary artery calcifications.  Lung RADS 3 short-term follow-up 6 months ordered 07/16/2022 LDCT chest: Mild paraseptal and centrilobular emphysema use bronchial wall thickening.  Numerous scattered solid pulmonary nodules, solitary new basilar right upper lobe pulmonary nodule measuring 4.8 mm.  Lung RADS 3 probably benign finding short-term follow-up in 6 months recommended 09/15/2022 chest x-ray PA and lateral: Hyperinflation of the lungs, flattening of the diaphragms consistent with COPD, bronchial cuffing and increased interstitial markings consistent with bronchitis.  Increased focal opacity in right base could represent atelectasis or early developing pneumonia, chronic opacity in the left base consistent with scar atelectasis. 12/04/2022 chest x-ray PA and lateral:  COPD with basilar scarring, no acute findings.   INTERVAL HISTORY: Last seen by me on 20 November 2022 at that time she was doing well and no major changes were instituted.  Last admitted to [facility] for exacerbation on 18 December due to influenza A, discharged 20 December.  No issues since hospital discharge, no other hospitalizations since then.   HPI Martha Woodard is a 57 year old former smoker who presents today for follow-up asthma/COPD overlap syndrome. She is well controlled on Bevespi and Qvar inhaler.  In the past she did not tolerate the Breztri combination due to flares of of thrush and had to discontinue the ICS component.  However, the ICS component controls her cough and asthma symptoms.  We had to prescribe the ICS component separately as she can then adjust this according to her asthma symptoms.  Previously she also had issues with "sores" with Anoro Ellipta, she has however been able to tolerate the Bevespi without difficulty.  Has not needed to use albuterol rescue since January.  She has multiple lung nodules that were previously noted as far back as 2017 however there are some new ones from that last CT performed in 2017.  These will need to be followed up expectantly.  She has follow-up CT scan that is upcoming.  She is compliant with oxygen therapy for chronic hypoxic/hypercarbic respiratory failure due to COPD and obesity with obesity hypoventilation.  She has not had any fevers, chills or sweats.  No purulent sputum production.  She feels that her dyspnea is well compensated.  Overall she feels well and looks well.    Review of Systems A 10 point review of systems was performed and it is as noted above otherwise negative.  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   Light headedness 06/17/2018   Allergic rhinitis 11/09/2017   Venous insufficiency  08/07/2017   Chronic respiratory failure (HCC) 11/13/2016   Thyroid nodule 11/28/2015   COPD (chronic obstructive pulmonary disease) with emphysema (HCC) 11/13/2015   Chronic diastolic heart failure (HCC) 11/13/2015   Morbid obesity with BMI of 45.0-49.9, adult (HCC) 10/01/2015   Obesity hypoventilation syndrome (HCC) 10/01/2015   Essential hypertension 10/01/2015   DM type 2 (diabetes mellitus, type 2) (HCC) 06/26/2014   Hyperlipidemia 06/26/2014   Social History   Tobacco Use   Smoking status: Former    Packs/day: 3.00    Years: 34.00    Additional pack years: 0.00    Total pack years: 102.00    Types: Cigarettes    Quit date: 07/10/2013    Years since quitting: 9.6   Smokeless tobacco: Never  Substance Use Topics   Alcohol use: No    Alcohol/week: 0.0 standard drinks of alcohol   Current Meds  Medication Sig   ACCU-CHEK AVIVA PLUS test strip TEST UP TO 4 TIMES DAILY AS DIRECTED   Accu-Chek Softclix Lancets lancets TEST UP TO 4 TIMES DAILY   acetaminophen (TYLENOL) 650 MG CR tablet Take 650 mg by mouth every 8 (eight) hours as needed for pain.   albuterol (PROVENTIL) (2.5 MG/3ML) 0.083% nebulizer solution Take 3 mLs (2.5 mg total) by nebulization every 6 (six) hours as needed for wheezing or shortness of breath.   aspirin EC 81 MG tablet Take 1 tablet (81 mg total) by mouth daily.   azelastine (ASTELIN) 0.1 % nasal spray Place 2 sprays into both nostrils 2 (two) times daily. Use in each nostril as directed   beclomethasone (QVAR REDIHALER) 80 MCG/ACT inhaler Inhale 2 puffs into the lungs 2 (two) times daily.   blood glucose meter kit and supplies Dispense based on patient and insurance preference. Use up to four times daily as directed. (FOR ICD-9 250.00, 250.01).   calcium-vitamin D (OSCAL WITH D) 500-200 MG-UNIT tablet Take 1 tablet by mouth 2 (two) times daily.   cetirizine (ZYRTEC) 10 MG tablet Take 10 mg by mouth daily.   fluticasone (FLONASE) 50 MCG/ACT nasal spray USE  1 OR 2 SPRAYS IN EACH NOSTRIL ONCE DAILY.   furosemide (LASIX) 20 MG tablet TAKE 1 TABLET BY MOUTH DAILY   gabapentin (NEURONTIN) 100 MG capsule TAKE 1 CAPSULE BY MOUTH 3 TIMES  DAILY   Glycopyrrolate-Formoterol (BEVESPI AEROSPHERE) 9-4.8 MCG/ACT AERO Inhale 2 puffs into the lungs 2 (two) times daily.   hydrochlorothiazide (MICROZIDE) 12.5 MG capsule TAKE 1 CAPSULE BY MOUTH DAILY   losartan (COZAAR) 25 MG tablet TAKE 1 TABLET BY MOUTH TWICE  DAILY   magnesium oxide (MAG-OX) 400 MG tablet Take 400 mg by mouth daily.   metFORMIN (GLUCOPHAGE) 500 MG tablet TAKE 2 TABLETS BY MOUTH TWICE  DAILY WITH MEALS   metoprolol succinate (TOPROL-XL) 25 MG 24 hr tablet TAKE 1 TABLET BY MOUTH ONCE  DAILY   montelukast (SINGULAIR) 10 MG tablet TAKE 1 TABLET BY MOUTH AT  BEDTIME   Multiple Vitamin (MULTIVITAMIN) tablet Take 1 tablet by mouth daily.   nystatin (MYCOSTATIN) 100000 UNIT/ML suspension Take 5 mLs (500,000 Units total) by mouth 4 (four) times daily.   ondansetron (ZOFRAN) 4 MG tablet Take 1 tablet (4 mg total) by mouth every 8 (eight) hours as needed for nausea or vomiting.  potassium chloride (KLOR-CON) 10 MEQ tablet TAKE 1 TABLET BY MOUTH TWICE  DAILY   PROVENTIL HFA 108 (90 Base) MCG/ACT inhaler Inhale 2 puffs into the lungs every 6 (six) hours as needed for wheezing or shortness of breath. 18 gram inhaler   rosuvastatin (CRESTOR) 20 MG tablet TAKE 1 TABLET BY MOUTH DAILY   Semaglutide,0.25 or 0.5MG /DOS, (OZEMPIC, 0.25 OR 0.5 MG/DOSE,) 2 MG/3ML SOPN Inject 0.25 mg weekly for 4 weeks, then increase to 0.5 mg weekly   sodium chloride (OCEAN) 0.65 % SOLN nasal spray Place 2 sprays into both nostrils as needed for congestion.   vitamin C (ASCORBIC ACID) 500 MG tablet Take 1,000 mg by mouth daily.    Vitamin E 180 MG CAPS Take 180 mg by mouth daily.   Immunization History  Administered Date(s) Administered   Influenza,inj,Quad PF,6+ Mos 07/02/2015, 08/19/2016, 08/07/2017, 06/16/2018, 06/29/2019,  07/31/2020, 07/05/2021, 09/10/2022   Moderna SARS-COV2 Booster Vaccination 11/05/2020   Moderna Sars-Covid-2 Vaccination 03/08/2020, 04/05/2020   Pneumococcal Polysaccharide-23 09/26/2015   Pneumococcal-Unspecified 11/23/2020       Objective:   Physical Exam BP 132/70 (BP Location: Left Arm, Cuff Size: Large)   Pulse 87   Temp 98.1 F (36.7 C)   Ht 5\' 6"  (1.676 m)   Wt 250 lb 6.4 oz (113.6 kg)   LMP 04/10/2014   SpO2 94%   BMI 40.42 kg/m   SpO2: 94 % O2 Device: Nasal cannula O2 Flow Rate (L/min): 1 L/min O2 Type: Continuous O2  GENERAL: Obese woman, no acute distress, comfortable with nasal cannula O2.  Ambulatory with assistance of cane. HEAD: Normocephalic, atraumatic. EYES: Pupils equal, round, reactive to light.  No scleral icterus. MOUTH: Dentition intact, oral mucosa moist, no thrush. NECK: Supple. No thyromegaly. Trachea midline. No JVD.  No adenopathy. PULMONARY: Mildly diminished breath sounds.  Coarse, otherwise no adventitious sounds.   CARDIOVASCULAR: S1 and S2. Regular rate and rhythm.  No rubs, murmurs or gallops heard. ABDOMEN: Obese, otherwise benign. MUSCULOSKELETAL: No joint deformity, no clubbing, no edema. NEUROLOGIC: No focal deficit, no gait disturbance, speech is fluent. SKIN: Intact,warm,dry. PSYCH: Mood and behavior normal.     Assessment & Plan:     ICD-10-CM   1. Asthma-COPD overlap syndrome  J44.89    Continue Bevespi and Qvar Continue as needed albuterol    2. Chronic respiratory failure with hypoxia and hypercapnia (HCC)  J96.11    J96.12    Continue oxygen at 1 L/min during the day Continue oxygen at 2 L/min during the night    3. Multiple lung nodules on CT  R91.8    Has upcoming LDCT follow-up    4. Obesity hypoventilation syndrome (HCC)  E66.2    She would benefit from weight loss      Overall Evolett is well compensated.  She is to continue her regimen as it is currently.  Will see the patient in follow-up in 3 months time  she is to contact us prior to that time should any new difficulties arise.  Gailen Shelter, MD Advanced Bronchoscopy PCCM [facility]    *This note was dictated using voice recognition software/Dragon.  Despite best efforts to proofread, errors can occur which can change the meaning. Any transcriptional errors that result from this process are unintentional and may not be fully corrected at the time of dictation.

## 2023-02-17 ENCOUNTER — Ambulatory Visit (INDEPENDENT_AMBULATORY_CARE_PROVIDER_SITE_OTHER): Payer: Medicare Other | Admitting: Obstetrics and Gynecology

## 2023-02-17 ENCOUNTER — Encounter: Payer: Self-pay | Admitting: Obstetrics and Gynecology

## 2023-02-17 VITALS — BP 143/76 | HR 82 | Resp 16 | Ht 66.0 in | Wt 250.9 lb

## 2023-02-17 DIAGNOSIS — E1159 Type 2 diabetes mellitus with other circulatory complications: Secondary | ICD-10-CM

## 2023-02-17 DIAGNOSIS — B372 Candidiasis of skin and nail: Secondary | ICD-10-CM

## 2023-02-17 DIAGNOSIS — Z1231 Encounter for screening mammogram for malignant neoplasm of breast: Secondary | ICD-10-CM

## 2023-02-17 DIAGNOSIS — Z01419 Encounter for gynecological examination (general) (routine) without abnormal findings: Secondary | ICD-10-CM

## 2023-02-17 DIAGNOSIS — I10 Essential (primary) hypertension: Secondary | ICD-10-CM

## 2023-02-17 MED ORDER — FLUCONAZOLE 100 MG PO TABS: 100.0000 mg | ORAL_TABLET | Freq: Every day | ORAL | 2 refills | Status: DC

## 2023-02-24 DIAGNOSIS — M9905 Segmental and somatic dysfunction of pelvic region: Secondary | ICD-10-CM | POA: Diagnosis not present

## 2023-02-24 DIAGNOSIS — M955 Acquired deformity of pelvis: Secondary | ICD-10-CM | POA: Diagnosis not present

## 2023-02-24 DIAGNOSIS — M5432 Sciatica, left side: Secondary | ICD-10-CM | POA: Diagnosis not present

## 2023-02-24 DIAGNOSIS — M9904 Segmental and somatic dysfunction of sacral region: Secondary | ICD-10-CM | POA: Diagnosis not present

## 2023-03-19 DIAGNOSIS — J449 Chronic obstructive pulmonary disease, unspecified: Secondary | ICD-10-CM | POA: Diagnosis not present

## 2023-03-23 ENCOUNTER — Telehealth: Payer: Self-pay | Admitting: Pulmonary Disease

## 2023-03-23 DIAGNOSIS — J441 Chronic obstructive pulmonary disease with (acute) exacerbation: Secondary | ICD-10-CM

## 2023-03-23 MED ORDER — ALBUTEROL SULFATE (2.5 MG/3ML) 0.083% IN NEBU
2.5000 mg | INHALATION_SOLUTION | Freq: Four times a day (QID) | RESPIRATORY_TRACT | 12 refills | Status: AC | PRN
Start: 2023-03-23 — End: ?

## 2023-03-23 NOTE — Telephone Encounter (Signed)
Called and spoke to patient. She stated that she is currently taking Qvar and Bevespi. She does not feel that Qvar is effective for her. She reports of persistent prod cough with white sputum and increased SOB since switching from Pulmicort to Qvar. She felt that Pulmicort worked well for her but [employer] is no longer making it.  She would like to try a different inhaler.  She gets some relief with albuterol HFA and nebulizer daily. She would also like refills on albuterol solution. This was prescribed by PCP.   Dr. Jayme Cloud, please advise. Thanks

## 2023-03-23 NOTE — Telephone Encounter (Signed)
Patient is aware of recommendations and voiced her understanding.  Albuterol sent to preferred pharmacy. Nothing further needed.

## 2023-03-23 NOTE — Telephone Encounter (Signed)
PT calling because on her last appt Dr. Reece Agar changed her inhaler to Qvar because the company no longer made her inhaler. PT states it does not work as well and she wonders if Dr. can change her inhaler to something better. As long as her ins covers it.   Pls call @ [phone]

## 2023-03-23 NOTE — Telephone Encounter (Signed)
She can actually increase to 4 puffs twice a day on the Qvar to see if this helps this still would be a reasonable dose.  She was doing well on it previously.  Suspect that the weather change may have worsened her symptoms.  We can send in a refill for her albuterol.  Unfortunately there are not very many of the inhalers are covered by her insurance.

## 2023-04-14 ENCOUNTER — Encounter: Payer: Self-pay | Admitting: Family Medicine

## 2023-04-14 ENCOUNTER — Ambulatory Visit (INDEPENDENT_AMBULATORY_CARE_PROVIDER_SITE_OTHER): Payer: Medicare Other | Admitting: Family Medicine

## 2023-04-14 VITALS — BP 122/76 | HR 90 | Temp 98.5°F | Ht 66.0 in | Wt 260.5 lb

## 2023-04-14 DIAGNOSIS — E782 Mixed hyperlipidemia: Secondary | ICD-10-CM

## 2023-04-14 DIAGNOSIS — E1159 Type 2 diabetes mellitus with other circulatory complications: Secondary | ICD-10-CM

## 2023-04-14 DIAGNOSIS — J439 Emphysema, unspecified: Secondary | ICD-10-CM

## 2023-04-14 DIAGNOSIS — J309 Allergic rhinitis, unspecified: Secondary | ICD-10-CM | POA: Diagnosis not present

## 2023-04-14 DIAGNOSIS — Z7985 Long-term (current) use of injectable non-insulin antidiabetic drugs: Secondary | ICD-10-CM | POA: Diagnosis not present

## 2023-04-14 DIAGNOSIS — R911 Solitary pulmonary nodule: Secondary | ICD-10-CM

## 2023-04-14 LAB — LIPID PANEL
Cholesterol: 113 mg/dL (ref 0–200)
HDL: 53.9 mg/dL (ref >39.00)
LDL Cholesterol: 29 mg/dL (ref 0–99)
NonHDL: 59.01
Total CHOL/HDL Ratio: 2
Triglycerides: 148 mg/dL (ref 0.0–149.0)
VLDL: 29.6 mg/dL (ref 0.0–40.0)

## 2023-04-14 LAB — COMPREHENSIVE METABOLIC PANEL
ALT: 33 U/L (ref 0–35)
AST: 27 U/L (ref 0–37)
Albumin: 4.4 g/dL (ref 3.5–5.2)
Alkaline Phosphatase: 50 U/L (ref 39–117)
BUN: 9 mg/dL (ref 6–23)
CO2: 37 mEq/L — ABNORMAL HIGH (ref 19–32)
Calcium: 10.4 mg/dL (ref 8.4–10.5)
Chloride: 98 mEq/L (ref 96–112)
Creatinine, Ser: 0.59 mg/dL (ref 0.40–1.20)
GFR: 99.23 mL/min (ref >60.00)
Glucose, Bld: 89 mg/dL (ref 70–99)
Potassium: 3.7 mEq/L (ref 3.5–5.1)
Sodium: 141 mEq/L (ref 135–145)
Total Bilirubin: 0.4 mg/dL (ref 0.2–1.2)
Total Protein: 7.1 g/dL (ref 6.0–8.3)

## 2023-04-14 LAB — MICROALBUMIN / CREATININE URINE RATIO
Creatinine,U: 17.5 mg/dL
Microalb Creat Ratio: 4 mg/g (ref 0.0–30.0)
Microalb, Ur: <0.7 mg/dL (ref 0.0–1.9)

## 2023-04-14 LAB — HEMOGLOBIN A1C: Hgb A1c MFr Bld: 6.3 % (ref 4.6–6.5)

## 2023-04-14 NOTE — Assessment & Plan Note (Signed)
Chronic issue.  Somewhat improved.  She will continue Astelin nasal spray 2 sprays each nostril twice daily and continue Flonase 2 sprays each nostril daily and Zyrtec over-the-counter and Singulair 10 mg daily.  Offered referral to an allergist to see about allergy injections or drops though she defers this at this time.

## 2023-04-14 NOTE — Assessment & Plan Note (Signed)
Patient declines further follow-up and evaluation of this issue given that she would not undergo any diagnostic procedure or treatment for this.

## 2023-04-14 NOTE — Assessment & Plan Note (Signed)
Chronic issue.  Seems to be well-controlled.  Will check an A1c today.  She will continue metformin 1000 mg twice daily and Ozempic 0.5 mg weekly.

## 2023-04-14 NOTE — Progress Notes (Signed)
Marikay Alar, MD Phone: [phone]  Martha Woodard is a 57 y.o. female who presents today for f/u.  COPD: Medication compliance- taking qvar 4 puffs BID  Dyspnea- some  Wheezing- some  Cough- some  Productive- white mucus Patient notes she has discussed this with her pulmonologist.  She was initially on 2 puffs twice daily of Qvar notes the pulmonologist advised her to go to 4 puffs twice daily.  She follows up with pulmonology next month.  Diabetes: Typically 95-131 on her glucose.  She is on metformin and Ozempic.  She does note chronic polydipsia.  No hypoglycemia.  She notes she is probably due to see her ophthalmologist.  Allergic rhinitis: Patient continues on Astelin, Flonase, Zyrtec, and Singulair.  She notes continued sneezing, rhinorrhea, postnasal drip, watery eyes, and intermittent sinus pain.  She does note addition of Astelin has been beneficial.   Social History   Tobacco Use  Smoking Status Former   Current packs/day: 0.00   Average packs/day: 3.0 packs/day for 34.0 years (102.0 ttl pk-yrs)   Types: Cigarettes   Start date: 07/11/1979   Quit date: 07/10/2013   Years since quitting: 9.7  Smokeless Tobacco Never    Current Outpatient Medications on File Prior to Visit  Medication Sig Dispense Refill   ACCU-CHEK AVIVA PLUS test strip TEST UP TO 4 TIMES DAILY AS DIRECTED 400 strip 3   Accu-Chek Softclix Lancets lancets TEST UP TO 4 TIMES DAILY 400 each 3   acetaminophen (TYLENOL) 650 MG CR tablet Take 650 mg by mouth every 8 (eight) hours as needed for pain.     albuterol (PROVENTIL) (2.5 MG/3ML) 0.083% nebulizer solution Take 3 mLs (2.5 mg total) by nebulization every 6 (six) hours as needed for wheezing or shortness of breath. 125 mL 12   aspirin EC 81 MG tablet Take 1 tablet (81 mg total) by mouth daily. 90 tablet 3   azelastine (ASTELIN) 0.1 % nasal spray Place 2 sprays into both nostrils 2 (two) times daily. Use in each nostril as directed 30 mL 12    beclomethasone (QVAR REDIHALER) 80 MCG/ACT inhaler Inhale 2 puffs into the lungs 2 (two) times daily. 10.6 g 11   blood glucose meter kit and supplies Dispense based on patient and insurance preference. Use up to four times daily as directed. (FOR ICD-9 250.00, 250.01). 1 each 0   calcium-vitamin D (OSCAL WITH D) 500-200 MG-UNIT tablet Take 1 tablet by mouth 2 (two) times daily.     cetirizine (ZYRTEC) 10 MG tablet Take 10 mg by mouth daily.     fluconazole (DIFLUCAN) 100 MG tablet Take 1 tablet (100 mg total) by mouth daily. Take 2 tablets by mouth on Day 1, then 1 tablet daily for 13 days. 15 tablet 2   fluticasone (FLONASE) 50 MCG/ACT nasal spray USE 1 OR 2 SPRAYS IN EACH NOSTRIL ONCE DAILY. 16 g 12   furosemide (LASIX) 20 MG tablet Take 1 tablet (20 mg total) by mouth daily. 90 tablet 3   gabapentin (NEURONTIN) 100 MG capsule TAKE 1 CAPSULE BY MOUTH 3 TIMES  DAILY 270 capsule 3   Glycopyrrolate-Formoterol (BEVESPI AEROSPHERE) 9-4.8 MCG/ACT AERO Inhale 2 puffs into the lungs 2 (two) times daily. 10.7 g 11   hydrochlorothiazide (MICROZIDE) 12.5 MG capsule Take 1 capsule (12.5 mg total) by mouth daily. 90 capsule 3   magnesium oxide (MAG-OX) 400 MG tablet Take 400 mg by mouth daily.     metFORMIN (GLUCOPHAGE) 500 MG tablet TAKE 2 TABLETS  BY MOUTH TWICE  DAILY WITH MEALS 360 tablet 3   metoprolol succinate (TOPROL-XL) 25 MG 24 hr tablet Take 1 tablet (25 mg total) by mouth daily. 90 tablet 3   montelukast (SINGULAIR) 10 MG tablet TAKE 1 TABLET BY MOUTH AT  BEDTIME 90 tablet 3   Multiple Vitamin (MULTIVITAMIN) tablet Take 1 tablet by mouth daily.     nystatin (MYCOSTATIN) 100000 UNIT/ML suspension Take 5 mLs (500,000 Units total) by mouth 4 (four) times daily. 473 mL 0   ondansetron (ZOFRAN) 4 MG tablet Take 1 tablet (4 mg total) by mouth every 8 (eight) hours as needed for nausea or vomiting. 20 tablet 0   potassium chloride (KLOR-CON) 10 MEQ tablet Take 1 tablet (10 mEq total) by mouth 2 (two)  times daily. 180 tablet 3   PROVENTIL HFA 108 (90 Base) MCG/ACT inhaler Inhale 2 puffs into the lungs every 6 (six) hours as needed for wheezing or shortness of breath. 18 gram inhaler 3 each 1   rosuvastatin (CRESTOR) 20 MG tablet Take 1 tablet (20 mg total) by mouth daily. 90 tablet 3   Semaglutide,0.25 or 0.5MG /DOS, (OZEMPIC, 0.25 OR 0.5 MG/DOSE,) 2 MG/3ML SOPN Inject 0.25 mg weekly for 4 weeks, then increase to 0.5 mg weekly 3 mL 2   sodium chloride (OCEAN) 0.65 % SOLN nasal spray Place 2 sprays into both nostrils as needed for congestion. 30 mL 11   vitamin C (ASCORBIC ACID) 500 MG tablet Take 1,000 mg by mouth daily.      Vitamin E 180 MG CAPS Take 180 mg by mouth daily.     losartan (COZAAR) 25 MG tablet Take 1 tablet (25 mg total) by mouth 2 (two) times daily for 25 days. 180 tablet 3   No current facility-administered medications on file prior to visit.     ROS see history of present illness  Objective  Physical Exam Vitals:   04/14/23 1030 04/14/23 1053  BP: 126/82 122/76  Pulse: 90   Temp: 98.5 F (36.9 C)   SpO2: 96%     BP Readings from Last 3 Encounters:  04/14/23 122/76  02/17/23 (!) 143/76  02/16/23 (!) 140/70   Wt Readings from Last 3 Encounters:  04/14/23 260 lb 8 oz (118.2 kg)  02/17/23 250 lb 14.4 oz (113.8 kg)  02/16/23 250 lb (113.4 kg)    Physical Exam Constitutional:      General: She is not in acute distress.    Appearance: She is not diaphoretic.  Cardiovascular:     Rate and Rhythm: Normal rate and regular rhythm.     Heart sounds: Normal heart sounds.  Pulmonary:     Effort: Pulmonary effort is normal.     Breath sounds: Normal breath sounds.  Musculoskeletal:     Right lower leg: No edema.     Left lower leg: No edema.  Skin:    General: Skin is warm and dry.  Neurological:     Mental Status: She is alert.      Assessment/Plan: Please see individual problem list.  Type 2 diabetes mellitus with other circulatory complication,  without long-term current use of insulin (HCC) Assessment & Plan: Chronic issue.  Seems to be well-controlled.  Will check an A1c today.  She will continue metformin 1000 mg twice daily and Ozempic 0.5 mg weekly.  Orders: -     Microalbumin / creatinine urine ratio -     Hemoglobin A1c  Pulmonary emphysema, unspecified emphysema type (HCC) Assessment & Plan:  Chronic issue.  Patient will continue Qvar 4 puffs twice daily as advised by pulmonology.  She will follow-up with pulmonology as planned.  I encouraged her to discuss possible medication change if the Qvar is not beneficial for her.   Mixed hyperlipidemia Assessment & Plan: Chronic issue.  Check lipid panel.  Continue Crestor 20 mg daily.  Orders: -     Comprehensive metabolic panel -     Lipid panel  Allergic rhinitis, unspecified seasonality, unspecified trigger Assessment & Plan: Chronic issue.  Somewhat improved.  She will continue Astelin nasal spray 2 sprays each nostril twice daily and continue Flonase 2 sprays each nostril daily and Zyrtec over-the-counter and Singulair 10 mg daily.  Offered referral to an allergist to see about allergy injections or drops though she defers this at this time.   Lung nodule Assessment & Plan: Patient declines further follow-up and evaluation of this issue given that she would not undergo any diagnostic procedure or treatment for this.    Health maintenance: Patient notes she canceled her lung cancer screening follow-up given that it was not going to be covered by her insurance.  She also notes she would not undergo biopsy or any kind of treatment if she did end up with lung cancer so she does not think she needs to proceed with any further evaluation of this.  Return in about 3 months (around 07/15/2023).   Marikay Alar, MD [facility]

## 2023-04-14 NOTE — Assessment & Plan Note (Signed)
Chronic issue.  Patient will continue Qvar 4 puffs twice daily as advised by pulmonology.  She will follow-up with pulmonology as planned.  I encouraged her to discuss possible medication change if the Qvar is not beneficial for her.

## 2023-04-14 NOTE — Assessment & Plan Note (Signed)
Chronic issue.  Check lipid panel.  Continue Crestor 20 mg daily. 

## 2023-04-18 DIAGNOSIS — J449 Chronic obstructive pulmonary disease, unspecified: Secondary | ICD-10-CM | POA: Diagnosis not present

## 2023-05-04 ENCOUNTER — Ambulatory Visit
Admission: RE | Admit: 2023-05-04 | Discharge: 2023-05-04 | Disposition: A | Discharge status: Home / Self Care | Payer: Medicare Other | Source: Ambulatory Visit | Attending: Obstetrics and Gynecology | Admitting: Obstetrics and Gynecology

## 2023-05-04 DIAGNOSIS — Z1231 Encounter for screening mammogram for malignant neoplasm of breast: Secondary | ICD-10-CM | POA: Diagnosis not present

## 2023-05-11 DIAGNOSIS — M955 Acquired deformity of pelvis: Secondary | ICD-10-CM | POA: Diagnosis not present

## 2023-05-11 DIAGNOSIS — M5432 Sciatica, left side: Secondary | ICD-10-CM | POA: Diagnosis not present

## 2023-05-11 DIAGNOSIS — M9905 Segmental and somatic dysfunction of pelvic region: Secondary | ICD-10-CM | POA: Diagnosis not present

## 2023-05-11 DIAGNOSIS — M9904 Segmental and somatic dysfunction of sacral region: Secondary | ICD-10-CM | POA: Diagnosis not present

## 2023-05-13 DIAGNOSIS — M9905 Segmental and somatic dysfunction of pelvic region: Secondary | ICD-10-CM | POA: Diagnosis not present

## 2023-05-13 DIAGNOSIS — M5432 Sciatica, left side: Secondary | ICD-10-CM | POA: Diagnosis not present

## 2023-05-13 DIAGNOSIS — M955 Acquired deformity of pelvis: Secondary | ICD-10-CM | POA: Diagnosis not present

## 2023-05-13 DIAGNOSIS — M9904 Segmental and somatic dysfunction of sacral region: Secondary | ICD-10-CM | POA: Diagnosis not present

## 2023-05-15 ENCOUNTER — Telehealth: Payer: Self-pay | Admitting: Acute Care

## 2023-05-15 ENCOUNTER — Ambulatory Visit: Payer: Medicare Other | Admitting: Pulmonary Disease

## 2023-05-15 ENCOUNTER — Encounter: Payer: Self-pay | Admitting: Pulmonary Disease

## 2023-05-15 ENCOUNTER — Telehealth: Payer: Self-pay

## 2023-05-15 VITALS — BP 158/84 | HR 82 | Temp 97.7°F | Ht 66.0 in | Wt 252.0 lb

## 2023-05-15 DIAGNOSIS — J9611 Chronic respiratory failure with hypoxia: Secondary | ICD-10-CM | POA: Diagnosis not present

## 2023-05-15 DIAGNOSIS — J4489 Other specified chronic obstructive pulmonary disease: Secondary | ICD-10-CM

## 2023-05-15 DIAGNOSIS — E662 Morbid (severe) obesity with alveolar hypoventilation: Secondary | ICD-10-CM | POA: Diagnosis not present

## 2023-05-15 DIAGNOSIS — R918 Other nonspecific abnormal finding of lung field: Secondary | ICD-10-CM

## 2023-05-15 DIAGNOSIS — J9612 Chronic respiratory failure with hypercapnia: Secondary | ICD-10-CM

## 2023-05-15 NOTE — Telephone Encounter (Signed)
Called and left VM to call back to schedule follow up LCS nodule scan.

## 2023-05-15 NOTE — Telephone Encounter (Signed)
LVM to let pt know ozempic(5 boxes are ready for pick up.    Medication: Ozempic   QTY: 5 Boxes   LOT: [id number] EXP: 09/28/2024   Sig: inject 0.5mg  into skin weekly

## 2023-05-15 NOTE — Telephone Encounter (Signed)
Pt picked up medication.

## 2023-05-15 NOTE — Progress Notes (Signed)
Subjective:    Patient ID: Martha Woodard, female    DOB: 04/13/1966, 57 y.o.   MRN: [id number]  Patient Care Team: [name], MD as PCP - General (Family Medicine) Lemar Livings, Merrily Pew, MD (General Surgery) Antonieta Iba, MD as Consulting Physician (Cardiology)  Chief Complaint  Patient presents with   Follow-up    DOE. Wheezing. Cough with yellow sputum.   PROBLEMS: Chronic hypoxic/hypercarbic respiratory failure Former smoker Moderate/severe COPD [facility] CHF Multiple pulmonary nodules   DATA: 09/28/2015 2D echo: EF 60 to 65%, grade 1 DD, mild left atrial dilation.  Right-sided pressures normal. 02/28/2020 PFTs:FEV1 1.42L or 49%, FVC or 2.24L 60%, FEV1/FVC 63%, no bronchodilator response.  RV is 122% indicating air trapping.  Diffusion capacity normal.  Consistent with moderate to severe COPD. 04/05/2020 chest x-ray: No active disease.  Consistent with COPD, left base scarring/atelectasis. 01/21/2022 LDCT: Mild to moderate centrilobular emphysema.  Multiple bilateral pulmonary nodules of which the larger were present on 01/17/2016 exam.  Some new nodules from 2017 exam.  Coronary artery calcifications.  Lung RADS 3 short-term follow-up 6 months ordered 07/16/2022 LDCT chest: Mild paraseptal and centrilobular emphysema use bronchial wall thickening.  Numerous scattered solid pulmonary nodules, solitary new basilar right upper lobe pulmonary nodule measuring 4.8 mm.  Lung RADS 3 probably benign finding short-term follow-up in 6 months recommended 09/15/2022 chest x-ray PA and lateral: Hyperinflation of the lungs, flattening of the diaphragms consistent with COPD, bronchial cuffing and increased interstitial markings consistent with bronchitis.  Increased focal opacity in right base could represent atelectasis or early developing pneumonia, chronic opacity in the left base consistent with scar atelectasis. 12/04/2022 chest x-ray PA and lateral: COPD with basilar scarring, no  acute findings.   INTERVAL HISTORY: Last seen by me on 16 Feb 2023 at that time she was doing well and no major changes were instituted.  Did develop issues with increasing cough in June due to allergen exposure, resolved with increasing Qvar transiently to 4 puffs twice a day.  Last admitted to [facility] for exacerbation on 18 December due to influenza A, discharged 20 December.  No issues since hospital discharge, no other hospitalizations since then.   HPI Martha Woodard is a 57 year old former smoker who presents today for follow-up asthma/COPD overlap syndrome. She is well controlled on Bevespi and Qvar inhaler.  In the past she did not tolerate the Breztri combination due to flares of of thrush and had to discontinue the ICS component.  However, the ICS component controls her cough and asthma symptoms.  We had to prescribe the ICS component separately as she can then adjust this according to her asthma symptoms.  Previously she also had issues with "sores" with Anoro Ellipta, she has however been able to tolerate the Bevespi without difficulty.  Has not needed to use albuterol rescue since January.  She did develop some issues with increasing cough in June and response to weather change and allergens.  This resolved with increasing Qvar 80 mcg to 4 puffs twice a day.  She then decreased it back to 2 puffs twice a day as maintenance once the cough resolved.   She has multiple lung nodules that were previously noted as far back as 2017 however there are some new ones from that last CT performed in 2017.  Her most recent low-dose CT was done on 16 July 2022, nodules were relatively stable however there was a new 4.8 mm solid right upper lobe nodule and she should have had  follow-up CT in April or May of this year however this did not occur.  We will reestablish her with the lung cancer screening program.  These nodules will need to be followed up expectantly.    She is compliant with oxygen therapy for chronic  hypoxic/hypercarbic respiratory failure due to COPD and obesity with obesity hypoventilation.  She has not had any fevers, chills or sweats.  No purulent sputum production.  She feels that her dyspnea is well compensated.  Overall she feels well and looks well.   Review of Systems A 10 point review of systems was performed and it is as noted above otherwise negative.   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   Light headedness 06/17/2018   Allergic rhinitis 11/09/2017   Venous insufficiency 08/07/2017   Chronic respiratory failure (HCC) 11/13/2016   Thyroid nodule 11/28/2015   COPD (chronic obstructive pulmonary disease) with emphysema (HCC) 11/13/2015   Chronic diastolic heart failure (HCC) 11/13/2015   Morbid obesity with BMI of 45.0-49.9, adult (HCC) 10/01/2015   Obesity hypoventilation syndrome (HCC) 10/01/2015   Essential hypertension 10/01/2015   DM type 2 (diabetes mellitus, type 2) (HCC) 06/26/2014   Hyperlipidemia 06/26/2014    Social History   Tobacco Use   Smoking status: Former    Current packs/day: 0.00    Average packs/day: 3.0 packs/day for 34.0 years (102.0 ttl pk-yrs)    Types: Cigarettes    Start date: 07/11/1979    Quit date: 07/10/2013    Years since quitting: 9.8   Smokeless tobacco: Never  Substance Use Topics   Alcohol use: No    Alcohol/week: 0.0 standard drinks of alcohol    Allergies  Allergen Reactions   Augmentin [Amoxicillin-Pot Clavulanate] Itching    Hives Can take PCN and Amoxicillin alone     Current Meds  Medication Sig   ACCU-CHEK AVIVA PLUS test strip TEST UP TO 4 TIMES DAILY AS DIRECTED   Accu-Chek Softclix Lancets lancets TEST UP TO 4 TIMES DAILY   acetaminophen (TYLENOL) 650 MG CR tablet Take 650 mg by mouth every 8 (eight) hours as needed for pain.   albuterol  (PROVENTIL) (2.5 MG/3ML) 0.083% nebulizer solution Take 3 mLs (2.5 mg total) by nebulization every 6 (six) hours as needed for wheezing or shortness of breath.   aspirin EC 81 MG tablet Take 1 tablet (81 mg total) by mouth daily.   azelastine (ASTELIN) 0.1 % nasal spray Place 2 sprays into both nostrils 2 (two) times daily. Use in each nostril as directed   beclomethasone (QVAR REDIHALER) 80 MCG/ACT inhaler Inhale 2 puffs into the lungs 2 (two) times daily.   blood glucose meter kit and supplies Dispense based on patient and insurance preference. Use up to four times daily as directed. (FOR ICD-9 250.00, 250.01).   calcium-vitamin D (OSCAL WITH D) 500-200 MG-UNIT tablet Take 1 tablet by mouth 2 (two) times daily.   cetirizine (ZYRTEC) 10 MG tablet Take 10 mg by mouth daily.   fluconazole (DIFLUCAN) 100 MG tablet Take 1 tablet (100 mg total) by mouth daily. Take 2 tablets by mouth on Day 1, then 1 tablet daily for 13 days.   fluticasone (FLONASE) 50 MCG/ACT nasal spray USE 1 OR 2 SPRAYS IN EACH NOSTRIL ONCE DAILY.   furosemide (LASIX) 20 MG tablet Take 1  tablet (20 mg total) by mouth daily.   gabapentin (NEURONTIN) 100 MG capsule TAKE 1 CAPSULE BY MOUTH 3 TIMES  DAILY   Glycopyrrolate-Formoterol (BEVESPI AEROSPHERE) 9-4.8 MCG/ACT AERO Inhale 2 puffs into the lungs 2 (two) times daily.   hydrochlorothiazide (MICROZIDE) 12.5 MG capsule Take 1 capsule (12.5 mg total) by mouth daily.   magnesium oxide (MAG-OX) 400 MG tablet Take 400 mg by mouth daily.   metFORMIN (GLUCOPHAGE) 500 MG tablet TAKE 2 TABLETS BY MOUTH TWICE  DAILY WITH MEALS   metoprolol succinate (TOPROL-XL) 25 MG 24 hr tablet Take 1 tablet (25 mg total) by mouth daily.   montelukast (SINGULAIR) 10 MG tablet TAKE 1 TABLET BY MOUTH AT  BEDTIME   Multiple Vitamin (MULTIVITAMIN) tablet Take 1 tablet by mouth daily.   nystatin (MYCOSTATIN) 100000 UNIT/ML suspension Take 5 mLs (500,000 Units total) by mouth 4 (four) times daily.   ondansetron  (ZOFRAN) 4 MG tablet Take 1 tablet (4 mg total) by mouth every 8 (eight) hours as needed for nausea or vomiting.   potassium chloride (KLOR-CON) 10 MEQ tablet Take 1 tablet (10 mEq total) by mouth 2 (two) times daily.   PROVENTIL HFA 108 (90 Base) MCG/ACT inhaler Inhale 2 puffs into the lungs every 6 (six) hours as needed for wheezing or shortness of breath. 18 gram inhaler   rosuvastatin (CRESTOR) 20 MG tablet Take 1 tablet (20 mg total) by mouth daily.   Semaglutide,0.25 or 0.5MG /DOS, (OZEMPIC, 0.25 OR 0.5 MG/DOSE,) 2 MG/3ML SOPN Inject 0.25 mg weekly for 4 weeks, then increase to 0.5 mg weekly   sodium chloride (OCEAN) 0.65 % SOLN nasal spray Place 2 sprays into both nostrils as needed for congestion.   vitamin C (ASCORBIC ACID) 500 MG tablet Take 1,000 mg by mouth daily.    Vitamin E 180 MG CAPS Take 180 mg by mouth daily.    Immunization History  Administered Date(s) Administered   Influenza,inj,Quad PF,6+ Mos 07/02/2015, 08/19/2016, 08/07/2017, 06/16/2018, 06/29/2019, 07/31/2020, 07/05/2021, 09/10/2022   Moderna SARS-COV2 Booster Vaccination 11/05/2020   Moderna Sars-Covid-2 Vaccination 03/08/2020, 04/05/2020   Pneumococcal Polysaccharide-23 09/26/2015   Pneumococcal-Unspecified 11/23/2020        Objective:     BP (!) 158/84 (BP Location: Left Arm, Cuff Size: Large)   Pulse 82   Temp 97.7 F (36.5 C)   Ht 5\' 6"  (1.676 m)   Wt 252 lb (114.3 kg)   LMP 04/10/2014   SpO2 92%   BMI 40.67 kg/m   SpO2: 92 % O2 Device: Nasal cannula O2 Flow Rate (L/min): 1 L/min O2 Type: Continuous O2  GENERAL: Obese woman, no acute distress, comfortable with nasal cannula O2.  Ambulatory with assistance of cane. HEAD: Normocephalic, atraumatic. EYES: Pupils equal, round, reactive to light.  No scleral icterus. MOUTH: Dentition intact, oral mucosa moist, no thrush. NECK: Supple. No thyromegaly. Trachea midline. No JVD.  No adenopathy. PULMONARY: Mildly diminished breath sounds.  Coarse,  otherwise no adventitious sounds.   CARDIOVASCULAR: S1 and S2. Regular rate and rhythm.  No rubs, murmurs or gallops heard. ABDOMEN: Obese, otherwise benign. MUSCULOSKELETAL: No joint deformity, no clubbing, no edema. NEUROLOGIC: No focal deficit, no gait disturbance, speech is fluent. SKIN: Intact,warm,dry. PSYCH: Mood and behavior normal.  Assessment & Plan:     ICD-10-CM   1. Asthma-COPD overlap syndrome  J44.89    Continue Bevespi 2 puffs twice a day Continue Qvar 80,2 puffs twice a day Continue as needed albuterol Increase Qvar to 4 puffs twice a day as  needed    2. Chronic respiratory failure with hypoxia and hypercapnia (HCC)  J96.11    J96.12    Patient compliant with oxygen therapy Notes benefit of the therapy    3. Multiple lung nodules on CT  R91.8    Missed follow-up CT Will reconnect with lung cancer screening program    4. Obesity hypoventilation syndrome (HCC)  E66.2    This issue adds complexity to her management She would benefit from weight loss     We will see the patient in follow-up in 3 months time she is to contact us prior to that time should any new difficulties arise.   Gailen Shelter, MD Advanced Bronchoscopy PCCM [facility]    *This note was dictated using voice recognition software/Dragon.  Despite best efforts to proofread, errors can occur which can change the meaning. Any transcriptional errors that result from this process are unintentional and may not be fully corrected at the time of dictation.

## 2023-05-15 NOTE — Patient Instructions (Addendum)
We are going to continue the same medications for now.  Your lungs sounded clear today.  It looks like you missed a follow-up with a lung cancer screening program, we will make sure that that gets reinstituted.  Will see him in follow-up in 3 months time call sooner should any new problems arise.

## 2023-05-18 NOTE — Telephone Encounter (Signed)
Noted thank you

## 2023-05-18 NOTE — Telephone Encounter (Signed)
Called and spoke to pt. Informed pt of the recommended 6 month follow up scan from 06/2022 (read as a Lung-RADS 3). Patient states she originally cancelled her April 2024 scan because she had a copay she could not afford and her insurance has not changed since April. Patient states she would like to wait for a bit before doing the scan as she knows she would not be able to afford it. Patient has our number and will call back when she is ready to scheduled. Will forward to Dr. Jayme Cloud as an Lorain Childes.

## 2023-05-18 NOTE — Telephone Encounter (Signed)
Called and left VM for pt

## 2023-05-19 DIAGNOSIS — J449 Chronic obstructive pulmonary disease, unspecified: Secondary | ICD-10-CM | POA: Diagnosis not present

## 2023-05-25 ENCOUNTER — Ambulatory Visit: Payer: Medicare Other | Admitting: Pulmonary Disease

## 2023-05-27 DIAGNOSIS — M955 Acquired deformity of pelvis: Secondary | ICD-10-CM | POA: Diagnosis not present

## 2023-05-27 DIAGNOSIS — M9904 Segmental and somatic dysfunction of sacral region: Secondary | ICD-10-CM | POA: Diagnosis not present

## 2023-05-27 DIAGNOSIS — M9905 Segmental and somatic dysfunction of pelvic region: Secondary | ICD-10-CM | POA: Diagnosis not present

## 2023-05-27 DIAGNOSIS — M5432 Sciatica, left side: Secondary | ICD-10-CM | POA: Diagnosis not present

## 2023-06-03 ENCOUNTER — Encounter: Payer: Self-pay | Admitting: Family Medicine

## 2023-06-03 ENCOUNTER — Telehealth (INDEPENDENT_AMBULATORY_CARE_PROVIDER_SITE_OTHER): Payer: Medicare Other | Admitting: Family Medicine

## 2023-06-03 VITALS — Ht 66.0 in | Wt 252.0 lb

## 2023-06-03 DIAGNOSIS — J0191 Acute recurrent sinusitis, unspecified: Secondary | ICD-10-CM | POA: Diagnosis not present

## 2023-06-03 MED ORDER — PREDNISONE 10 MG PO TABS: 10.0000 mg | ORAL_TABLET | Freq: Every day | ORAL | 0 refills | Status: DC

## 2023-06-03 MED ORDER — DOXYCYCLINE HYCLATE 100 MG PO TABS
100.0000 mg | ORAL_TABLET | Freq: Two times a day (BID) | ORAL | 0 refills | Status: AC
Start: 2023-06-03 — End: 2023-06-08

## 2023-06-03 MED ORDER — SACCHAROMYCES BOULARDII 250 MG PO CAPS: 250.0000 mg | ORAL_CAPSULE | Freq: Every day | ORAL | 0 refills | Status: DC

## 2023-06-03 NOTE — Progress Notes (Signed)
Virtual Visit via Video note  I connected with Martha Woodard on 06/03/23 at 1000 by video and verified that I am speaking with the correct person using two identifiers. Martha Woodard is currently located at home and  is currently alone during visit. The provider, Dana Allan, MD is located in their office at time of visit.  I discussed the limitations, risks, security and privacy concerns of performing an evaluation and management service by video and the availability of in person appointments. I also discussed with the patient that there may be a patient responsible charge related to this service. The patient expressed understanding and agreed to proceed.  Subjective: PCP: [name], MD  Chief Complaint  Patient presents with   Sinus Problem    In nose, head and eye X this weekend. Has not taken covid.    Sinus Problem  Symptoms started 6 days ago Nasal congestion and drainage. Sinus pressure and headache Intermittent cough  Subjective fevers Mild swelling around nasal area [employer] with some relief No recent sick contacts No testing for COVID Denies chest pain, sob, wheezing.  CBG 117   ROS: Per HPI  Current Outpatient Medications:    ACCU-CHEK AVIVA PLUS test strip, TEST UP TO 4 TIMES DAILY AS DIRECTED, Disp: 400 strip, Rfl: 3   Accu-Chek Softclix Lancets lancets, TEST UP TO 4 TIMES DAILY, Disp: 400 each, Rfl: 3   acetaminophen (TYLENOL) 650 MG CR tablet, Take 650 mg by mouth every 8 (eight) hours as needed for pain., Disp: , Rfl:    albuterol (PROVENTIL) (2.5 MG/3ML) 0.083% nebulizer solution, Take 3 mLs (2.5 mg total) by nebulization every 6 (six) hours as needed for wheezing or shortness of breath., Disp: 125 mL, Rfl: 12   aspirin EC 81 MG tablet, Take 1 tablet (81 mg total) by mouth daily., Disp: 90 tablet, Rfl: 3   azelastine (ASTELIN) 0.1 % nasal spray, Place 2 sprays into both nostrils 2 (two) times daily. Use in each nostril as directed,  Disp: 30 mL, Rfl: 12   beclomethasone (QVAR REDIHALER) 80 MCG/ACT inhaler, Inhale 2 puffs into the lungs 2 (two) times daily., Disp: 10.6 g, Rfl: 11   blood glucose meter kit and supplies, Dispense based on patient and insurance preference. Use up to four times daily as directed. (FOR ICD-9 250.00, 250.01)., Disp: 1 each, Rfl: 0   calcium-vitamin D (OSCAL WITH D) 500-200 MG-UNIT tablet, Take 1 tablet by mouth 2 (two) times daily., Disp: , Rfl:    cetirizine (ZYRTEC) 10 MG tablet, Take 10 mg by mouth daily., Disp: , Rfl:    doxycycline (VIBRA-TABS) 100 MG tablet, Take 1 tablet (100 mg total) by mouth 2 (two) times daily for 5 days., Disp: 10 tablet, Rfl: 0   fluticasone (FLONASE) 50 MCG/ACT nasal spray, USE 1 OR 2 SPRAYS IN EACH NOSTRIL ONCE DAILY., Disp: 16 g, Rfl: 12   furosemide (LASIX) 20 MG tablet, Take 1 tablet (20 mg total) by mouth daily., Disp: 90 tablet, Rfl: 3   gabapentin (NEURONTIN) 100 MG capsule, TAKE 1 CAPSULE BY MOUTH 3 TIMES  DAILY, Disp: 270 capsule, Rfl: 3   Glycopyrrolate-Formoterol (BEVESPI AEROSPHERE) 9-4.8 MCG/ACT AERO, Inhale 2 puffs into the lungs 2 (two) times daily., Disp: 10.7 g, Rfl: 11   hydrochlorothiazide (MICROZIDE) 12.5 MG capsule, Take 1 capsule (12.5 mg total) by mouth daily., Disp: 90 capsule, Rfl: 3   magnesium oxide (MAG-OX) 400 MG tablet, Take 400 mg by mouth daily., Disp: ,  Rfl:    metFORMIN (GLUCOPHAGE) 500 MG tablet, TAKE 2 TABLETS BY MOUTH TWICE  DAILY WITH MEALS, Disp: 360 tablet, Rfl: 3   metoprolol succinate (TOPROL-XL) 25 MG 24 hr tablet, Take 1 tablet (25 mg total) by mouth daily., Disp: 90 tablet, Rfl: 3   montelukast (SINGULAIR) 10 MG tablet, TAKE 1 TABLET BY MOUTH AT  BEDTIME, Disp: 90 tablet, Rfl: 3   Multiple Vitamin (MULTIVITAMIN) tablet, Take 1 tablet by mouth daily., Disp: , Rfl:    nystatin (MYCOSTATIN) 100000 UNIT/ML suspension, Take 5 mLs (500,000 Units total) by mouth 4 (four) times daily., Disp: 473 mL, Rfl: 0   ondansetron (ZOFRAN) 4 MG  tablet, Take 1 tablet (4 mg total) by mouth every 8 (eight) hours as needed for nausea or vomiting., Disp: 20 tablet, Rfl: 0   potassium chloride (KLOR-CON) 10 MEQ tablet, Take 1 tablet (10 mEq total) by mouth 2 (two) times daily., Disp: 180 tablet, Rfl: 3   predniSONE (DELTASONE) 10 MG tablet, Take 1 tablet (10 mg total) by mouth daily with breakfast., Disp: 10 tablet, Rfl: 0   PROVENTIL HFA 108 (90 Base) MCG/ACT inhaler, Inhale 2 puffs into the lungs every 6 (six) hours as needed for wheezing or shortness of breath. 18 gram inhaler, Disp: 3 each, Rfl: 1   rosuvastatin (CRESTOR) 20 MG tablet, Take 1 tablet (20 mg total) by mouth daily., Disp: 90 tablet, Rfl: 3   saccharomyces boulardii (FLORASTOR) 250 MG capsule, Take 1 capsule (250 mg total) by mouth daily., Disp: 90 capsule, Rfl: 0   Semaglutide,0.25 or 0.5MG /DOS, (OZEMPIC, 0.25 OR 0.5 MG/DOSE,) 2 MG/3ML SOPN, Inject 0.25 mg weekly for 4 weeks, then increase to 0.5 mg weekly, Disp: 3 mL, Rfl: 2   sodium chloride (OCEAN) 0.65 % SOLN nasal spray, Place 2 sprays into both nostrils as needed for congestion., Disp: 30 mL, Rfl: 11   vitamin C (ASCORBIC ACID) 500 MG tablet, Take 1,000 mg by mouth daily. , Disp: , Rfl:    Vitamin E 180 MG CAPS, Take 180 mg by mouth daily., Disp: , Rfl:    losartan (COZAAR) 25 MG tablet, Take 1 tablet (25 mg total) by mouth 2 (two) times daily for 25 days., Disp: 180 tablet, Rfl: 3  Observations/Objective: Physical Exam HENT:     Nose: Nasal tenderness, mucosal edema, congestion and rhinorrhea present. No nasal deformity.  Pulmonary:     Effort: Pulmonary effort is normal.  Neurological:     Mental Status: She is alert and oriented to person, place, and time. Mental status is at baseline.  Psychiatric:        Mood and Affect: Mood normal.        Behavior: Behavior normal.        Thought Content: Thought content normal.        Judgment: Judgment normal.    Assessment and Plan: Acute recurrent sinusitis,  unspecified location Assessment & Plan: Acute on Chronic Recurrent sinusitis Start steroids x 5 days Start antibiotics x 5 days Start Probiotics daily and continue until treatment completed Continue Flonase, Astelin and nasal saline as previously prescribed Referral sent to ENT for evaluation  Orders: -     predniSONE; Take 1 tablet (10 mg total) by mouth daily with breakfast.  Dispense: 10 tablet; Refill: 0 -     Doxycycline Hyclate; Take 1 tablet (100 mg total) by mouth 2 (two) times daily for 5 days.  Dispense: 10 tablet; Refill: 0 -     Saccharomyces boulardii; Take  1 capsule (250 mg total) by mouth daily.  Dispense: 90 capsule; Refill: 0 -     Ambulatory referral to ENT    Follow Up Instructions: Return if symptoms worsen or fail to improve, for PCP.   I discussed the assessment and treatment plan with the patient. The patient was provided an opportunity to ask questions and all were answered. The patient agreed with the plan and demonstrated an understanding of the instructions.   The patient was advised to call back or seek an in-person evaluation if the symptoms worsen or if the condition fails to improve as anticipated.  The above assessment and management plan was discussed with the patient. The patient verbalized understanding of and has agreed to the management plan. Patient is aware to call the clinic if symptoms persist or worsen. Patient is aware when to return to the clinic for a follow-up visit. Patient educated on when it is appropriate to go to the emergency department.     Dana Allan, MD

## 2023-06-03 NOTE — Assessment & Plan Note (Addendum)
Acute on Chronic Recurrent sinusitis Start steroids x 5 days Start antibiotics x 5 days Start Probiotics daily and continue until treatment completed Continue Flonase, Astelin and nasal saline as previously prescribed Referral sent to ENT for evaluation

## 2023-06-03 NOTE — Patient Instructions (Addendum)
It was a pleasure meeting you today. Thank you for allowing me to take part in your health care.  Our goals for today as we discussed include:  Start Prednisone 10 mg daily for 5 days Continue Flonase Continue Astelin Start Doxycyline 100 mg two times a day for 5 days Start Probiotics daily and continue for 14 days after treatment  Increase water intake Use Neti Pot, can be purchased over the counter  Referral sent to ENT for evaluation of sinuses  If no improvement follow up with PCP  If you have any questions or concerns, please do not hesitate to call the office at [phone].  I look forward to our next visit and until then take care and stay safe.  Regards,   Dana Allan, MD   [facility]

## 2023-06-10 DIAGNOSIS — M9904 Segmental and somatic dysfunction of sacral region: Secondary | ICD-10-CM | POA: Diagnosis not present

## 2023-06-10 DIAGNOSIS — M955 Acquired deformity of pelvis: Secondary | ICD-10-CM | POA: Diagnosis not present

## 2023-06-10 DIAGNOSIS — M5432 Sciatica, left side: Secondary | ICD-10-CM | POA: Diagnosis not present

## 2023-06-10 DIAGNOSIS — M9905 Segmental and somatic dysfunction of pelvic region: Secondary | ICD-10-CM | POA: Diagnosis not present

## 2023-06-17 DIAGNOSIS — J320 Chronic maxillary sinusitis: Secondary | ICD-10-CM | POA: Diagnosis not present

## 2023-06-17 DIAGNOSIS — J340 Abscess, furuncle and carbuncle of nose: Secondary | ICD-10-CM | POA: Diagnosis not present

## 2023-06-18 ENCOUNTER — Other Ambulatory Visit: Payer: Self-pay

## 2023-06-18 DIAGNOSIS — R232 Flushing: Secondary | ICD-10-CM

## 2023-06-18 NOTE — Telephone Encounter (Signed)
LOV 04/14/23 NOV 07/15/23 Last Fill: 12/05/21

## 2023-06-19 MED ORDER — GABAPENTIN 100 MG PO CAPS
100.0000 mg | ORAL_CAPSULE | Freq: Three times a day (TID) | ORAL | 1 refills | Status: DC
Start: 2023-06-19 — End: 2023-08-26

## 2023-06-24 DIAGNOSIS — M955 Acquired deformity of pelvis: Secondary | ICD-10-CM | POA: Diagnosis not present

## 2023-06-24 DIAGNOSIS — M9905 Segmental and somatic dysfunction of pelvic region: Secondary | ICD-10-CM | POA: Diagnosis not present

## 2023-06-24 DIAGNOSIS — M9904 Segmental and somatic dysfunction of sacral region: Secondary | ICD-10-CM | POA: Diagnosis not present

## 2023-06-24 DIAGNOSIS — M5432 Sciatica, left side: Secondary | ICD-10-CM | POA: Diagnosis not present

## 2023-07-02 ENCOUNTER — Telehealth: Payer: Self-pay | Admitting: Pulmonary Disease

## 2023-07-02 DIAGNOSIS — J439 Emphysema, unspecified: Secondary | ICD-10-CM

## 2023-07-02 NOTE — Telephone Encounter (Signed)
Pt calling in to get her prescription sent in to [facility] for her bevespi inhaler [phone]

## 2023-07-07 MED ORDER — BEVESPI AEROSPHERE 9-4.8 MCG/ACT IN AERO
2.0000 | INHALATION_SPRAY | Freq: Two times a day (BID) | RESPIRATORY_TRACT | 3 refills | Status: DC
Start: 2023-07-07 — End: 2023-09-14

## 2023-07-07 NOTE — Telephone Encounter (Signed)
I have sent in the prescription and notified the patient.  Nothing further needed. 

## 2023-07-08 DIAGNOSIS — M9904 Segmental and somatic dysfunction of sacral region: Secondary | ICD-10-CM | POA: Diagnosis not present

## 2023-07-08 DIAGNOSIS — M9905 Segmental and somatic dysfunction of pelvic region: Secondary | ICD-10-CM | POA: Diagnosis not present

## 2023-07-08 DIAGNOSIS — M955 Acquired deformity of pelvis: Secondary | ICD-10-CM | POA: Diagnosis not present

## 2023-07-08 DIAGNOSIS — M5432 Sciatica, left side: Secondary | ICD-10-CM | POA: Diagnosis not present

## 2023-07-15 ENCOUNTER — Ambulatory Visit: Payer: Medicare Other | Admitting: Family Medicine

## 2023-07-21 ENCOUNTER — Encounter: Payer: Self-pay | Admitting: Family Medicine

## 2023-07-21 ENCOUNTER — Ambulatory Visit: Payer: Medicare Other | Admitting: Family Medicine

## 2023-07-21 VITALS — BP 116/72 | HR 91 | Temp 97.9°F | Ht 66.0 in | Wt 252.0 lb

## 2023-07-21 DIAGNOSIS — Z7985 Long-term (current) use of injectable non-insulin antidiabetic drugs: Secondary | ICD-10-CM | POA: Diagnosis not present

## 2023-07-21 DIAGNOSIS — E1159 Type 2 diabetes mellitus with other circulatory complications: Secondary | ICD-10-CM | POA: Diagnosis not present

## 2023-07-21 DIAGNOSIS — M5432 Sciatica, left side: Secondary | ICD-10-CM | POA: Diagnosis not present

## 2023-07-21 DIAGNOSIS — M955 Acquired deformity of pelvis: Secondary | ICD-10-CM | POA: Diagnosis not present

## 2023-07-21 DIAGNOSIS — Z6841 Body Mass Index (BMI) 40.0 and over, adult: Secondary | ICD-10-CM

## 2023-07-21 DIAGNOSIS — M9905 Segmental and somatic dysfunction of pelvic region: Secondary | ICD-10-CM | POA: Diagnosis not present

## 2023-07-21 DIAGNOSIS — J0191 Acute recurrent sinusitis, unspecified: Secondary | ICD-10-CM

## 2023-07-21 DIAGNOSIS — R252 Cramp and spasm: Secondary | ICD-10-CM | POA: Insufficient documentation

## 2023-07-21 DIAGNOSIS — Z23 Encounter for immunization: Secondary | ICD-10-CM

## 2023-07-21 DIAGNOSIS — Z7984 Long term (current) use of oral hypoglycemic drugs: Secondary | ICD-10-CM

## 2023-07-21 DIAGNOSIS — I10 Essential (primary) hypertension: Secondary | ICD-10-CM

## 2023-07-21 DIAGNOSIS — M9904 Segmental and somatic dysfunction of sacral region: Secondary | ICD-10-CM | POA: Diagnosis not present

## 2023-07-21 LAB — POCT GLYCOSYLATED HEMOGLOBIN (HGB A1C): Hemoglobin A1C: 5.9 % — AB (ref 4.0–5.6)

## 2023-07-21 NOTE — Assessment & Plan Note (Signed)
Resolved.  Patient has completed ENT evaluation for this.  Monitor for recurrence.

## 2023-07-21 NOTE — Progress Notes (Signed)
Marikay Alar, MD Phone: [phone]  Martha Woodard is a 57 y.o. female who presents today for f/u.  HYPERTENSION Disease Monitoring Home BP Monitoring 120s/70s Chest pain- no    Dyspnea- no change to chronic dyspnea Medications Compliance-  taking hydrochlorothiazide, losartan, metoprolol.  Edema- no BMET    Component Value Date/Time   NA 141 04/14/2023 1105   K 3.7 04/14/2023 1105   CL 98 04/14/2023 1105   CO2 37 (H) 04/14/2023 1105   GLUCOSE 89 04/14/2023 1105   BUN 9 04/14/2023 1105   BUN 8 02/10/2014 1112   CREATININE 0.59 04/14/2023 1105   CALCIUM 10.4 04/14/2023 1105   GFRNONAA >60 09/17/2022 0713   GFRAA >60 12/03/2018 1503   DIABETES Disease Monitoring: Blood Sugar ranges-95-140 Polyuria/phagia/dipsia- no      Optho- UTD Medications: Compliance- taking metformin, ozempic Hypoglycemic symptoms- no  Obesity: Patient notes she walks daily at her house.  Her chronic lung issues limit her ability to exercise aggressively.  She has eggs and toast for breakfast.  Has a salad for lunch and sometimes has a cheat meal.  She will typically have soup with lots of vegetables for dinner.  No soda or sweet tea.  No fried foods.  No sugary foods.  Sinus infection: Patient recently treated for sinus infection and has recovered.  She did see ENT and she notes everything checked out well from their perspective.  Muscle cramps: Patient notes chronic issues with muscle cramps in her legs.  She thinks she drinks half a gallon of water daily.  She does not stretch.   Social History   Tobacco Use  Smoking Status Former   Current packs/day: 0.00   Average packs/day: 3.0 packs/day for 34.0 years (102.0 ttl pk-yrs)   Types: Cigarettes   Start date: 07/11/1979   Quit date: 07/10/2013   Years since quitting: 10.0  Smokeless Tobacco Never    Current Outpatient Medications on File Prior to Visit  Medication Sig Dispense Refill   ACCU-CHEK AVIVA PLUS test strip TEST UP TO 4 TIMES  DAILY AS DIRECTED 400 strip 3   Accu-Chek Softclix Lancets lancets TEST UP TO 4 TIMES DAILY 400 each 3   acetaminophen (TYLENOL) 650 MG CR tablet Take 650 mg by mouth every 8 (eight) hours as needed for pain.     albuterol (PROVENTIL) (2.5 MG/3ML) 0.083% nebulizer solution Take 3 mLs (2.5 mg total) by nebulization every 6 (six) hours as needed for wheezing or shortness of breath. 125 mL 12   aspirin EC 81 MG tablet Take 1 tablet (81 mg total) by mouth daily. 90 tablet 3   azelastine (ASTELIN) 0.1 % nasal spray Place 2 sprays into both nostrils 2 (two) times daily. Use in each nostril as directed 30 mL 12   beclomethasone (QVAR REDIHALER) 80 MCG/ACT inhaler Inhale 2 puffs into the lungs 2 (two) times daily. 10.6 g 11   blood glucose meter kit and supplies Dispense based on patient and insurance preference. Use up to four times daily as directed. (FOR ICD-9 250.00, 250.01). 1 each 0   calcium-vitamin D (OSCAL WITH D) 500-200 MG-UNIT tablet Take 1 tablet by mouth 2 (two) times daily.     cetirizine (ZYRTEC) 10 MG tablet Take 10 mg by mouth daily.     fluticasone (FLONASE) 50 MCG/ACT nasal spray USE 1 OR 2 SPRAYS IN EACH NOSTRIL ONCE DAILY. 16 g 12   furosemide (LASIX) 20 MG tablet Take 1 tablet (20 mg total) by mouth daily. 90  tablet 3   gabapentin (NEURONTIN) 100 MG capsule Take 1 capsule (100 mg total) by mouth 3 (three) times daily. 270 capsule 1   Glycopyrrolate-Formoterol (BEVESPI AEROSPHERE) 9-4.8 MCG/ACT AERO Inhale 2 puffs into the lungs 2 (two) times daily. 3 each 3   hydrochlorothiazide (MICROZIDE) 12.5 MG capsule Take 1 capsule (12.5 mg total) by mouth daily. 90 capsule 3   magnesium oxide (MAG-OX) 400 MG tablet Take 400 mg by mouth daily.     metFORMIN (GLUCOPHAGE) 500 MG tablet TAKE 2 TABLETS BY MOUTH TWICE  DAILY WITH MEALS 360 tablet 3   metoprolol succinate (TOPROL-XL) 25 MG 24 hr tablet Take 1 tablet (25 mg total) by mouth daily. 90 tablet 3   montelukast (SINGULAIR) 10 MG tablet TAKE  1 TABLET BY MOUTH AT  BEDTIME 90 tablet 3   Multiple Vitamin (MULTIVITAMIN) tablet Take 1 tablet by mouth daily.     nystatin (MYCOSTATIN) 100000 UNIT/ML suspension Take 5 mLs (500,000 Units total) by mouth 4 (four) times daily. 473 mL 0   ondansetron (ZOFRAN) 4 MG tablet Take 1 tablet (4 mg total) by mouth every 8 (eight) hours as needed for nausea or vomiting. 20 tablet 0   potassium chloride (KLOR-CON) 10 MEQ tablet Take 1 tablet (10 mEq total) by mouth 2 (two) times daily. 180 tablet 3   predniSONE (DELTASONE) 10 MG tablet Take 1 tablet (10 mg total) by mouth daily with breakfast. 10 tablet 0   PROVENTIL HFA 108 (90 Base) MCG/ACT inhaler Inhale 2 puffs into the lungs every 6 (six) hours as needed for wheezing or shortness of breath. 18 gram inhaler 3 each 1   rosuvastatin (CRESTOR) 20 MG tablet Take 1 tablet (20 mg total) by mouth daily. 90 tablet 3   saccharomyces boulardii (FLORASTOR) 250 MG capsule Take 1 capsule (250 mg total) by mouth daily. 90 capsule 0   Semaglutide,0.25 or 0.5MG /DOS, (OZEMPIC, 0.25 OR 0.5 MG/DOSE,) 2 MG/3ML SOPN Inject 0.25 mg weekly for 4 weeks, then increase to 0.5 mg weekly 3 mL 2   sodium chloride (OCEAN) 0.65 % SOLN nasal spray Place 2 sprays into both nostrils as needed for congestion. 30 mL 11   vitamin C (ASCORBIC ACID) 500 MG tablet Take 1,000 mg by mouth daily.      Vitamin E 180 MG CAPS Take 180 mg by mouth daily.     losartan (COZAAR) 25 MG tablet Take 1 tablet (25 mg total) by mouth 2 (two) times daily for 25 days. 180 tablet 3   No current facility-administered medications on file prior to visit.     ROS see history of present illness  Objective  Physical Exam Vitals:   07/21/23 1044 07/21/23 1102  BP: 122/82 116/72  Pulse: 91   Temp: 97.9 F (36.6 C)   SpO2: 94%     BP Readings from Last 3 Encounters:  07/21/23 116/72  05/15/23 (!) 158/84  04/14/23 122/76   Wt Readings from Last 3 Encounters:  07/21/23 252 lb (114.3 kg)  06/03/23 252  lb (114.3 kg)  05/15/23 252 lb (114.3 kg)    Physical Exam Constitutional:      General: She is not in acute distress.    Appearance: She is not diaphoretic.  Cardiovascular:     Rate and Rhythm: Normal rate and regular rhythm.     Heart sounds: Normal heart sounds.  Pulmonary:     Effort: Pulmonary effort is normal.     Breath sounds: Normal breath sounds.  Skin:  General: Skin is warm and dry.  Neurological:     Mental Status: She is alert.      Assessment/Plan: Please see individual problem list.  Essential hypertension Assessment & Plan: Chronic issue.  Adequately controlled at home.  Patient will continue metoprolol 25 mg daily, HCTZ 12.5 mg daily, and losartan 25 mg twice daily.   Acute recurrent sinusitis, unspecified location Assessment & Plan: Resolved.  Patient has completed ENT evaluation for this.  Monitor for recurrence.   Type 2 diabetes mellitus with other circulatory complication, without long-term current use of insulin (HCC) Assessment & Plan: Chronic issue.  Check A1c.  Continue Ozempic 0.5 mg weekly and metformin 1000 mg twice daily.  Orders: -     POCT glycosylated hemoglobin (Hb A1C)  Morbid obesity with BMI of 45.0-49.9, adult [facility]) Assessment & Plan: Chronic issue.  Encouraged remaining as active as she is able to.  She will continue healthy diet.   Muscle cramps Assessment & Plan: Chronic remittent issue.  Discussed stretching adequately before bed.  Discussed taking an adequate water.  She can try yellow mustard prior to bedtime.  Prior electrolytes were acceptable.   Encounter for administration of vaccine -     Flu vaccine trivalent PF, 6mos and older(Flulaval,Afluria,Fluarix,Fluzone) -     Pneumococcal conjugate vaccine 20-valent     Health Maintenance: We will request her ophthalmology records.  Patient will be on Prevnar 20 and flu vaccines today.  Return in about 3 months (around 10/21/2023) for With PCP for diabetes, 6  months with Dr. Anola Gurney for transfer of care.   Marikay Alar, MD [facility]

## 2023-07-21 NOTE — Assessment & Plan Note (Signed)
Chronic issue.  Adequately controlled at home.  Patient will continue metoprolol 25 mg daily, HCTZ 12.5 mg daily, and losartan 25 mg twice daily.

## 2023-07-21 NOTE — Assessment & Plan Note (Signed)
Chronic issue.  Encouraged remaining as active as she is able to.  She will continue healthy diet.

## 2023-07-21 NOTE — Assessment & Plan Note (Signed)
Chronic remittent issue.  Discussed stretching adequately before bed.  Discussed taking an adequate water.  She can try yellow mustard prior to bedtime.  Prior electrolytes were acceptable.

## 2023-07-21 NOTE — Assessment & Plan Note (Signed)
Chronic issue.  Check A1c.  Continue Ozempic 0.5 mg weekly and metformin 1000 mg twice daily.

## 2023-08-04 DIAGNOSIS — M7532 Calcific tendinitis of left shoulder: Secondary | ICD-10-CM | POA: Diagnosis not present

## 2023-08-11 DIAGNOSIS — M9904 Segmental and somatic dysfunction of sacral region: Secondary | ICD-10-CM | POA: Diagnosis not present

## 2023-08-11 DIAGNOSIS — M9905 Segmental and somatic dysfunction of pelvic region: Secondary | ICD-10-CM | POA: Diagnosis not present

## 2023-08-11 DIAGNOSIS — M955 Acquired deformity of pelvis: Secondary | ICD-10-CM | POA: Diagnosis not present

## 2023-08-11 DIAGNOSIS — M5432 Sciatica, left side: Secondary | ICD-10-CM | POA: Diagnosis not present

## 2023-08-17 ENCOUNTER — Ambulatory Visit (INDEPENDENT_AMBULATORY_CARE_PROVIDER_SITE_OTHER): Payer: Medicare Other | Admitting: *Deleted

## 2023-08-17 VITALS — Ht 66.0 in | Wt 250.0 lb

## 2023-08-17 DIAGNOSIS — Z0000 Encounter for general adult medical examination without abnormal findings: Secondary | ICD-10-CM

## 2023-08-17 NOTE — Patient Instructions (Signed)
Martha Woodard , Thank you for taking time to come for your Medicare Wellness Visit. I appreciate your ongoing commitment to your health goals. Please review the following plan we discussed and let me know if I can assist you in the future.   Referrals/Orders/Follow-Ups/Clinician Recommendations: Remember to update your vaccines  This is a list of the screening recommended for you and due dates:  Health Maintenance  Topic Date Due   DTaP/Tdap/Td vaccine (1 - Tdap) Never done   Zoster (Shingles) Vaccine (1 of 2) Never done   COVID-19 Vaccine (3 - Moderna risk series) 12/03/2020   Eye exam for diabetics  02/05/2023   Screening for Lung Cancer  07/17/2023   Complete foot exam   01/12/2024   Hemoglobin A1C  01/19/2024   Yearly kidney function blood test for diabetes  04/13/2024   Yearly kidney health urinalysis for diabetes  04/13/2024   Medicare Annual Wellness Visit  08/16/2024   Mammogram  05/03/2025   Colon Cancer Screening  12/31/2025   Pap with HPV screening  02/05/2026   Flu Shot  Completed   Hepatitis C Screening  Completed   HIV Screening  Completed   HPV Vaccine  Aged Out    Advanced directives: (Declined) Advance directive discussed with you today. Even though you declined this today, please call our office should you change your mind, and we can give you the proper paperwork for you to fill out.  Next Medicare Annual Wellness Visit scheduled for next year: Yes 08/19/24 @ 9:30

## 2023-08-17 NOTE — Progress Notes (Signed)
Subjective:   Martha Woodard is a 57 y.o. female who presents for Medicare Annual (Subsequent) preventive examination.  Visit Complete: Virtual I connected with  Angelli H Menard on 08/17/23 by a audio enabled telemedicine application and verified that I am speaking with the correct person using two identifiers.  Patient Location: Home  Provider Location: Office/Clinic  I discussed the limitations of evaluation and management by telemedicine. The patient expressed understanding and agreed to proceed.  Vital Signs: Because this visit was a virtual/telehealth visit, some criteria may be missing or patient reported. Any vitals not documented were not able to be obtained and vitals that have been documented are patient reported.  Cardiac Risk Factors include: diabetes mellitus;dyslipidemia;obesity (BMI >30kg/m2);hypertension     Objective:    Today's Vitals   08/17/23 1424  Weight: 250 lb (113.4 kg)  Height: 5\' 6"  (1.676 m)   Body mass index is 40.35 kg/m.     08/17/2023    2:41 PM 08/14/2022   10:07 AM 08/13/2021    8:36 AM 08/10/2020    8:59 AM 08/10/2019    8:42 AM 12/03/2018    2:58 PM 12/24/2017    9:22 AM  Advanced Directives  Does Patient Have a Medical Advance Directive? Yes No No No No No No  Type of Estate agent of [place];Living will        Copy of Healthcare Power of Attorney in Chart? No - copy requested        Would patient like information on creating a medical advance directive?  No - Patient declined No - Patient declined No - Patient declined No - Patient declined No - Patient declined Yes (MAU/Ambulatory/Procedural Areas - Information given)    Current Medications (verified) Outpatient Encounter Medications as of 08/17/2023  Medication Sig   ACCU-CHEK AVIVA PLUS test strip TEST UP TO 4 TIMES DAILY AS DIRECTED   Accu-Chek Softclix Lancets lancets TEST UP TO 4 TIMES DAILY   acetaminophen (TYLENOL) 650 MG CR tablet Take 650 mg by mouth  every 8 (eight) hours as needed for pain.   albuterol (PROVENTIL) (2.5 MG/3ML) 0.083% nebulizer solution Take 3 mLs (2.5 mg total) by nebulization every 6 (six) hours as needed for wheezing or shortness of breath.   aspirin EC 81 MG tablet Take 1 tablet (81 mg total) by mouth daily.   azelastine (ASTELIN) 0.1 % nasal spray Place 2 sprays into both nostrils 2 (two) times daily. Use in each nostril as directed   beclomethasone (QVAR REDIHALER) 80 MCG/ACT inhaler Inhale 2 puffs into the lungs 2 (two) times daily.   blood glucose meter kit and supplies Dispense based on patient and insurance preference. Use up to four times daily as directed. (FOR ICD-9 250.00, 250.01).   calcium-vitamin D (OSCAL WITH D) 500-200 MG-UNIT tablet Take 1 tablet by mouth 2 (two) times daily.   cetirizine (ZYRTEC) 10 MG tablet Take 10 mg by mouth daily.   fluticasone (FLONASE) 50 MCG/ACT nasal spray USE 1 OR 2 SPRAYS IN EACH NOSTRIL ONCE DAILY.   furosemide (LASIX) 20 MG tablet Take 1 tablet (20 mg total) by mouth daily.   gabapentin (NEURONTIN) 100 MG capsule Take 1 capsule (100 mg total) by mouth 3 (three) times daily.   Glycopyrrolate-Formoterol (BEVESPI AEROSPHERE) 9-4.8 MCG/ACT AERO Inhale 2 puffs into the lungs 2 (two) times daily.   hydrochlorothiazide (MICROZIDE) 12.5 MG capsule Take 1 capsule (12.5 mg total) by mouth daily.   magnesium oxide (MAG-OX) 400 MG tablet  Take 400 mg by mouth daily.   metFORMIN (GLUCOPHAGE) 500 MG tablet TAKE 2 TABLETS BY MOUTH TWICE  DAILY WITH MEALS   metoprolol succinate (TOPROL-XL) 25 MG 24 hr tablet Take 1 tablet (25 mg total) by mouth daily.   montelukast (SINGULAIR) 10 MG tablet TAKE 1 TABLET BY MOUTH AT  BEDTIME   Multiple Vitamin (MULTIVITAMIN) tablet Take 1 tablet by mouth daily.   nystatin (MYCOSTATIN) 100000 UNIT/ML suspension Take 5 mLs (500,000 Units total) by mouth 4 (four) times daily.   ondansetron (ZOFRAN) 4 MG tablet Take 1 tablet (4 mg total) by mouth every 8 (eight)  hours as needed for nausea or vomiting.   potassium chloride (KLOR-CON) 10 MEQ tablet Take 1 tablet (10 mEq total) by mouth 2 (two) times daily.   PROVENTIL HFA 108 (90 Base) MCG/ACT inhaler Inhale 2 puffs into the lungs every 6 (six) hours as needed for wheezing or shortness of breath. 18 gram inhaler   rosuvastatin (CRESTOR) 20 MG tablet Take 1 tablet (20 mg total) by mouth daily.   saccharomyces boulardii (FLORASTOR) 250 MG capsule Take 1 capsule (250 mg total) by mouth daily.   Semaglutide,0.25 or 0.5MG /DOS, (OZEMPIC, 0.25 OR 0.5 MG/DOSE,) 2 MG/3ML SOPN Inject 0.25 mg weekly for 4 weeks, then increase to 0.5 mg weekly   sodium chloride (OCEAN) 0.65 % SOLN nasal spray Place 2 sprays into both nostrils as needed for congestion.   vitamin C (ASCORBIC ACID) 500 MG tablet Take 1,000 mg by mouth daily.    Vitamin E 180 MG CAPS Take 180 mg by mouth daily.   losartan (COZAAR) 25 MG tablet Take 1 tablet (25 mg total) by mouth 2 (two) times daily for 25 days.   predniSONE (DELTASONE) 10 MG tablet Take 1 tablet (10 mg total) by mouth daily with breakfast. (Patient not taking: Reported on 08/17/2023)   No facility-administered encounter medications on file as of 08/17/2023.    Allergies (verified) Augmentin [amoxicillin-pot clavulanate]   History: Past Medical History:  Diagnosis Date   CHF (congestive heart failure) (HCC)    Colon polyp 01-01-2016   COPD (chronic obstructive pulmonary disease) (HCC)    Diabetes mellitus without complication (HCC)    Diastolic heart failure (HCC)    HLD (hyperlipidemia)    Hypertension    Morbid obesity with BMI of 45.0-49.9, adult (HCC)    Multiple lung nodules on CT    Shortness of breath dyspnea    Past Surgical History:  Procedure Laterality Date   BIOPSY THYROID Right 02-06-14   NEGATIVE FOR MALIGNANT CELLS proteinaceous material and macrophages.   BREAST BIOPSY Left    neg- core   COLONOSCOPY WITH PROPOFOL N/A 01/01/2016   Procedure: COLONOSCOPY WITH  PROPOFOL;  Surgeon: [name], MD;  Location: [facility] ENDOSCOPY;  Service: Endoscopy;  Laterality: N/A;   thryoid fna Right April 2015   Proteinaceous material and macrophages.   WISDOM TOOTH EXTRACTION     Family History  Problem Relation Age of Onset   Cancer Mother 72       uterian and ovarian    Goiter Mother    Stroke Father    Breast cancer Sister 70   Stroke Brother    Breast cancer Sister 38   Heart murmur Sister    Social History   Socioeconomic History   Marital status: Single    Spouse name: Not on file   Number of children: Not on file   Years of education: Not on file   Highest  education level: Not on file  Occupational History   Not on file  Tobacco Use   Smoking status: Former    Current packs/day: 0.00    Average packs/day: 3.0 packs/day for 34.0 years (102.0 ttl pk-yrs)    Types: Cigarettes    Start date: 07/11/1979    Quit date: 07/10/2013    Years since quitting: 10.1   Smokeless tobacco: Never  Vaping Use   Vaping status: Former  Substance and Sexual Activity   Alcohol use: No    Alcohol/week: 0.0 standard drinks of alcohol   Drug use: No   Sexual activity: Not Currently  Other Topics Concern   Not on file  Social History Narrative   Not on file   Social Determinants of Health   Financial Resource Strain: Low Risk  (08/17/2023)   Overall Financial Resource Strain (CARDIA)    Difficulty of Paying Living Expenses: Not hard at all  Food Insecurity: No Food Insecurity (08/17/2023)   Hunger Vital Sign    Worried About Running Out of Food in the Last Year: Never true    Ran Out of Food in the Last Year: Never true  Transportation Needs: No Transportation Needs (08/17/2023)   PRAPARE - Administrator, Civil Service (Medical): No    Lack of Transportation (Non-Medical): No  Physical Activity: Inactive (08/17/2023)   Exercise Vital Sign    Days of Exercise per Week: 0 days    Minutes of Exercise per Session: 0 min  Stress: No Stress  Concern Present (08/17/2023)   [employer] of Occupational Health - Occupational Stress Questionnaire    Feeling of Stress : Only a little  Social Connections: Socially Isolated (08/17/2023)   Social Connection and Isolation Panel [NHANES]    Frequency of Communication with Friends and Family: More than three times a week    Frequency of Social Gatherings with Friends and Family: More than three times a week    Attends Religious Services: Never    Database administrator or Organizations: No    Attends Engineer, structural: Never    Marital Status: Divorced    Tobacco Counseling Counseling given: Not Answered   Clinical Intake:  Pre-visit preparation completed: Yes  Pain : No/denies pain     BMI - recorded: 40.35 Nutritional Status: BMI > 30  Obese Nutritional Risks: None (has a little nausea yesterday, fine today) Diabetes: Yes CBG done?: Yes (FBS 110) CBG resulted in Enter/ Edit results?: No Did pt. bring in CBG monitor from home?: No  How often do you need to have someone help you when you read instructions, pamphlets, or other written materials from your doctor or pharmacy?: 1 - Never  Interpreter Needed?: No  Information entered by :: R. Antoino Westhoff LPN   Activities of Daily Living    08/17/2023    2:27 PM 09/16/2022    3:29 PM  In your present state of health, do you have any difficulty performing the following activities:  Hearing? 0 0  Vision? 0 0  Comment readers   Difficulty concentrating or making decisions? 0 0  Walking or climbing stairs? 1 0  Dressing or bathing? 0 0  Doing errands, shopping? 0 0  Preparing Food and eating ? N   Using the Toilet? N   In the past six months, have you accidently leaked urine? Y   Comment when she coughs at times   Do you have problems with loss of bowel control? N  Managing your Medications? N   Managing your Finances? N   Housekeeping or managing your Housekeeping? N     Patient Care  Team: [name], MD as PCP - General (Family Medicine) Antonieta Iba, MD as Consulting Physician (Cardiology) Salena Saner, MD as Consulting Physician (Pulmonary Disease)  Indicate any recent Medical Services you may have received from other than [facility] providers in the past year (date may be approximate).     Assessment:   This is a routine wellness examination for Ellana.  Hearing/Vision screen Hearing Screening - Comments:: No issues Vision Screening - Comments:: readers   Goals Addressed             This Visit's Progress    Patient Stated       Wants to lose some weight       Depression Screen    08/17/2023    2:36 PM 07/21/2023   10:48 AM 04/14/2023   10:47 AM 01/12/2023   10:32 AM 12/04/2022   10:32 AM 11/27/2022    9:30 AM 08/27/2022   10:43 AM  PHQ 2/9 Scores  PHQ - 2 Score 0 0 0 2 0 0 1  PHQ- 9 Score 4 6 0 8 4 4      Fall Risk    08/17/2023    2:29 PM 07/21/2023   10:48 AM 04/14/2023   10:47 AM 01/12/2023   10:31 AM 12/04/2022   10:32 AM  Fall Risk   Falls in the past year? 0 0 0 0 0  Number falls in past yr: 0 0 0 0 0  Injury with Fall? 0 0 0 0 0  Risk for fall due to : No Fall Risks No Fall Risks No Fall Risks No Fall Risks No Fall Risks  Follow up Falls prevention discussed;Falls evaluation completed Falls evaluation completed Falls evaluation completed Falls evaluation completed Falls evaluation completed    MEDICARE RISK AT HOME: Medicare Risk at Home Any stairs in or around the home?: Yes If so, are there any without handrails?: No Home free of loose throw rugs in walkways, pet beds, electrical cords, etc?: Yes Adequate lighting in your home to reduce risk of falls?: Yes Life alert?: No Use of a cane, walker or w/c?: No Grab bars in the bathroom?: Yes Shower chair or bench in shower?: No Elevated toilet seat or a handicapped toilet?: No   Cognitive Function:    12/24/2017    9:28 AM  MMSE - Mini Mental State Exam   Orientation to time 5  Orientation to Place 5  Registration 3  Attention/ Calculation 5  Recall 2  Language- name 2 objects 2  Language- repeat 1  Language- follow 3 step command 3  Language- read & follow direction 1  Write a sentence 1  Copy design 1  Total score 29        08/17/2023    2:41 PM 08/14/2022   10:06 AM 08/13/2021    8:41 AM 08/10/2020    8:59 AM 08/10/2019    8:48 AM  6CIT Screen  What Year? 0 points 0 points 0 points 0 points 0 points  What month? 0 points 0 points 0 points 0 points 0 points  What time? 0 points 0 points 0 points 0 points 0 points  Count back from 20 0 points 0 points 0 points 0 points 0 points  Months in reverse 4 points 0 points 0 points 0 points 0 points  Repeat phrase 0 points  0 points 0 points 0 points 0 points  Total Score 4 points 0 points 0 points 0 points 0 points    Immunizations Immunization History  Administered Date(s) Administered   Influenza, Seasonal, Injecte, Preservative Fre 07/21/2023   Influenza,inj,Quad PF,6+ Mos 07/02/2015, 08/19/2016, 08/07/2017, 06/16/2018, 06/29/2019, 07/31/2020, 07/05/2021, 09/10/2022   Moderna SARS-COV2 Booster Vaccination 11/05/2020   Moderna Sars-Covid-2 Vaccination 03/08/2020, 04/05/2020   PNEUMOCOCCAL CONJUGATE-20 07/21/2023   Pneumococcal Polysaccharide-23 09/26/2015   Pneumococcal-Unspecified 11/23/2020    TDAP status: Due, Education has been provided regarding the importance of this vaccine. Advised may receive this vaccine at local pharmacy or Health Dept. Aware to provide a copy of the vaccination record if obtained from local pharmacy or Health Dept. Verbalized acceptance and understanding.  Flu Vaccine status: Up to date  Pneumococcal vaccine status: Up to date  Covid-19 vaccine status: Information provided on how to obtain vaccines.   Qualifies for Shingles Vaccine? Yes   Zostavax completed No   Shingrix Completed?: No.    Education has been provided regarding the  importance of this vaccine. Patient has been advised to call insurance company to determine out of pocket expense if they have not yet received this vaccine. Advised may also receive vaccine at local pharmacy or Health Dept. Verbalized acceptance and understanding.  Screening Tests Health Maintenance  Topic Date Due   DTaP/Tdap/Td (1 - Tdap) Never done   Zoster Vaccines- Shingrix (1 of 2) Never done   COVID-19 Vaccine (3 - Moderna risk series) 12/03/2020   OPHTHALMOLOGY EXAM  02/05/2023   Lung Cancer Screening  07/17/2023   Medicare Annual Wellness (AWV)  08/15/2023   FOOT EXAM  01/12/2024   HEMOGLOBIN A1C  01/19/2024   Diabetic kidney evaluation - eGFR measurement  04/13/2024   Diabetic kidney evaluation - Urine ACR  04/13/2024   MAMMOGRAM  05/03/2025   Colonoscopy  12/31/2025   Cervical Cancer Screening (HPV/Pap Cotest)  02/05/2026   INFLUENZA VACCINE  Completed   Hepatitis C Screening  Completed   HIV Screening  Completed   HPV VACCINES  Aged Out    Health Maintenance  Health Maintenance Due  Topic Date Due   DTaP/Tdap/Td (1 - Tdap) Never done   Zoster Vaccines- Shingrix (1 of 2) Never done   COVID-19 Vaccine (3 - Moderna risk series) 12/03/2020   OPHTHALMOLOGY EXAM  02/05/2023   Lung Cancer Screening  07/17/2023   Medicare Annual Wellness (AWV)  08/15/2023    Colonoscopy Status Colonoscopy  Completed 12/2015 Repeat in 10 years  Mammogram status: Completed 04/2023. Repeat every year    Lung Cancer Screening: (Low Dose CT Chest recommended if Age 50-80 years, 20 pack-year currently smoking OR have quit w/in 15years.) does qualify.  Last 06/2022 Sees pulmonologist   Additional Screening:  Hepatitis C Screening: does qualify; Completed 08/2017  Vision Screening: Recommended annual ophthalmology exams for early detection of glaucoma and other disorders of the eye. Is the patient up to date with their annual eye exam?  Yes  Who is the provider or what is the name of  the office in which the patient attends annual eye exams? [facility] If pt is not established with a provider, would they like to be referred to a provider to establish care? No .   Dental Screening: Recommended annual dental exams for proper oral hygiene  Diabetic Foot Exam: Diabetic Foot Exam: Completed 12/2022  Community Resource Referral / Chronic Care Management: CRR required this visit?  No   CCM required this  visit?  No     Plan:     I have personally reviewed and noted the following in the patient's chart:   Medical and social history Use of alcohol, tobacco or illicit drugs  Current medications and supplements including opioid prescriptions. Patient is not currently taking opioid prescriptions. Functional ability and status Nutritional status Physical activity Advanced directives List of other physicians Hospitalizations, surgeries, and ER visits in previous 12 months Vitals Screenings to include cognitive, depression, and falls Referrals and appointments  In addition, I have reviewed and discussed with patient certain preventive protocols, quality metrics, and best practice recommendations. A written personalized care plan for preventive services as well as general preventive health recommendations were provided to patient.     Sydell Axon, LPN   [id number]/06/[id number]   After Visit Summary: (Pick Up) Due to this being a telephonic visit, with patients personalized plan was offered to patient and patient has requested to Pick up at office.  Nurse Notes: None

## 2023-08-19 ENCOUNTER — Ambulatory Visit: Payer: Medicare Other | Admitting: Pulmonary Disease

## 2023-08-19 ENCOUNTER — Encounter: Payer: Self-pay | Admitting: Pulmonary Disease

## 2023-08-19 VITALS — BP 138/80 | HR 89 | Temp 97.1°F | Ht 66.0 in | Wt 250.0 lb

## 2023-08-19 DIAGNOSIS — J9612 Chronic respiratory failure with hypercapnia: Secondary | ICD-10-CM | POA: Diagnosis not present

## 2023-08-19 DIAGNOSIS — R918 Other nonspecific abnormal finding of lung field: Secondary | ICD-10-CM | POA: Diagnosis not present

## 2023-08-19 DIAGNOSIS — J9611 Chronic respiratory failure with hypoxia: Secondary | ICD-10-CM | POA: Diagnosis not present

## 2023-08-19 DIAGNOSIS — J449 Chronic obstructive pulmonary disease, unspecified: Secondary | ICD-10-CM

## 2023-08-19 DIAGNOSIS — E662 Morbid (severe) obesity with alveolar hypoventilation: Secondary | ICD-10-CM | POA: Diagnosis not present

## 2023-08-19 DIAGNOSIS — Z2911 Encounter for prophylactic immunotherapy for respiratory syncytial virus (RSV): Secondary | ICD-10-CM

## 2023-08-19 DIAGNOSIS — J4489 Other specified chronic obstructive pulmonary disease: Secondary | ICD-10-CM | POA: Diagnosis not present

## 2023-08-19 MED ORDER — OHTUVAYRE 3 MG/2.5ML IN SUSP: 3.0000 mg | Freq: Two times a day (BID) | RESPIRATORY_TRACT | 11 refills | Status: DC

## 2023-08-19 NOTE — Patient Instructions (Signed)
VISIT SUMMARY:  During today's visit, we discussed your COPD management, recent exacerbation, and general health maintenance. We reviewed your current medications and addressed your recent increase in shortness of breath. We also talked about your history of sinus infections and your experiences with vaccinations.  YOUR PLAN:  -CHRONIC OBSTRUCTIVE PULMONARY DISEASE (COPD): COPD is a chronic lung condition that makes it hard to breathe. You have been managing it with Bevespi, Qvar, and Albuterol as needed. Recently, you experienced increased shortness of breath. We will start you on Ohtuvayre twice daily to improve lung function, refer you to pulmonary rehab, and order pulmonary function tests. Please follow up in three months.  -GENERAL HEALTH MAINTENANCE: You have received flu and pneumonia vaccines. We discussed the importance of the RSV vaccine, which can help protect you from respiratory infections. We will administer the RSV vaccine today.  INSTRUCTIONS:  Please follow up in three months. We will check your progress with the new medication and review the results of your pulmonary function tests.

## 2023-08-19 NOTE — Progress Notes (Signed)
Subjective:    Patient ID: Martha Woodard, female    DOB: [birth year], 57 y.o.   MRN: [id number]  Patient Care Team: [name], MD as PCP - General (Family Medicine) Antonieta Iba, MD as Consulting Physician (Cardiology) Salena Saner, MD as Consulting Physician (Pulmonary Disease) Myrene Galas, OD as Referring Physician [facility])  Chief Complaint  Patient presents with   Follow-up    DOE. No wheezing. Cough with white/yellow sputum.    BACKGROUND/INTERVAL:Martha Woodard is a 57 year old former smoker who presents today for follow-up asthma/COPD overlap syndrome. She is obtained on Bevespi and Qvar inhaler.  She was last seen on 15 May 2023.  No exacerbations since then.  She has noted increased shortness of breath gradually since her last visit.  PROBLEMS: Chronic hypoxic/hypercarbic respiratory failure Former smoker Moderate/severe COPD [facility] CHF Multiple pulmonary nodules  HPI Discussed the use of AI scribe software for clinical note transcription with the patient, who gave verbal consent to proceed.  History of Present Illness   The patient, diagnosed with COPD, has been managing her condition with Bevespi, Qvar, and as-needed Albuterol. She reports not having to use Albuterol for over three months, indicating a period of stability. However, she has recently experienced an increase in shortness of breath, the cause of which is uncertain. She speculates it may be due to dry weather conditions.  In addition to her COPD, the patient has a history of recurrent sinus infections. She was referred to an ear, nose, and throat specialist, but no infection was detected at the time of her visit, possibly due to recent antibiotic use.  The patient has been vaccinated against flu and pneumonia. However, she reports experiencing sickness, including nausea and discomfort at the injection site, for several days following vaccinations. Despite these side effects, she is open to  receiving the RSV vaccine.  The patient also mentions a recent issue with her pharmacy being unable to provide her Qvar medication due to a backorder, but this was eventually resolved.     DATA: 09/28/2015 2D echo: EF 60 to 65%, grade 1 DD, mild left atrial dilation.  Right-sided pressures normal. 02/28/2020 PFTs:FEV1 1.42L or 49%, FVC or 2.24L 60%, FEV1/FVC 63%, no bronchodilator response.  RV is 122% indicating air trapping.  Diffusion capacity normal.  Consistent with moderate to severe COPD. 04/05/2020 chest x-ray: No active disease.  Consistent with COPD, left base scarring/atelectasis. 01/21/2022 LDCT: Mild to moderate centrilobular emphysema.  Multiple bilateral pulmonary nodules of which the larger were present on 01/17/2016 exam.  Some new nodules from 2017 exam.  Coronary artery calcifications.  Lung RADS 3 short-term follow-up 6 months ordered 07/16/2022 LDCT chest: Mild paraseptal and centrilobular emphysema use bronchial wall thickening.  Numerous scattered solid pulmonary nodules, solitary new basilar right upper lobe pulmonary nodule measuring 4.8 mm.  Lung RADS 3 probably benign finding short-term follow-up in 6 months recommended 09/15/2022 chest x-ray PA and lateral: Hyperinflation of the lungs, flattening of the diaphragms consistent with COPD, bronchial cuffing and increased interstitial markings consistent with bronchitis.  Increased focal opacity in right base could represent atelectasis or early developing pneumonia, chronic opacity in the left base consistent with scar atelectasis. 12/04/2022 chest x-ray PA and lateral: COPD with basilar scarring, no acute findings.   Review of Systems A 10 point review of systems was performed and it is as noted above otherwise negative.   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   Light headedness 06/17/2018   Allergic rhinitis 11/09/2017   Venous insufficiency 08/07/2017   Chronic respiratory failure (HCC) 11/13/2016   Thyroid nodule 11/28/2015   COPD (chronic obstructive pulmonary disease) with emphysema (HCC) 11/13/2015   Chronic diastolic heart failure (HCC) 11/13/2015   Morbid obesity with BMI of 45.0-49.9, adult (HCC) 10/01/2015   Obesity hypoventilation syndrome (HCC) 10/01/2015   Essential hypertension 10/01/2015   DM type 2 (diabetes mellitus, type 2) (HCC) 06/26/2014   Hyperlipidemia 06/26/2014    Social History   Tobacco Use   Smoking status: Former    Current packs/day: 0.00    Average packs/day: 3.0 packs/day for 34.0 years (102.0 ttl pk-yrs)    Types: Cigarettes    Start date: 07/11/1979    Quit date: 07/10/2013    Years since quitting: 10.1   Smokeless tobacco: Never  Substance Use Topics   Alcohol use: No    Alcohol/week: 0.0 standard drinks of alcohol    Allergies  Allergen Reactions   Augmentin [Amoxicillin-Pot Clavulanate] Itching    Hives Can take PCN and Amoxicillin alone     Current Meds  Medication Sig   ACCU-CHEK AVIVA PLUS test strip TEST UP TO 4 TIMES DAILY AS DIRECTED   Accu-Chek Softclix Lancets lancets TEST UP TO 4 TIMES DAILY   acetaminophen (TYLENOL) 650 MG CR tablet Take 650 mg by mouth every 8 (eight) hours as needed for pain.   albuterol (PROVENTIL) (2.5 MG/3ML) 0.083% nebulizer solution Take 3 mLs (2.5 mg total) by nebulization every 6 (six) hours as needed for wheezing or shortness of breath.   aspirin EC 81 MG tablet Take 1 tablet (81 mg total) by mouth daily.   azelastine (ASTELIN) 0.1 % nasal spray Place 2 sprays into both nostrils 2 (two) times daily. Use in each nostril as directed   beclomethasone (QVAR REDIHALER) 80 MCG/ACT inhaler Inhale 2 puffs into the lungs 2 (two) times daily.   blood glucose meter kit and supplies  Dispense based on patient and insurance preference. Use up to four times daily as directed. (FOR ICD-9 250.00, 250.01).   calcium-vitamin D (OSCAL WITH D) 500-200 MG-UNIT tablet Take 1 tablet by mouth 2 (two) times daily.   cetirizine (ZYRTEC) 10 MG tablet Take 10 mg by mouth daily.   Ensifentrine (OHTUVAYRE) 3 MG/2.5ML SUSP Take 3 mg by nebulization 2 (two) times daily.   fluticasone (FLONASE) 50 MCG/ACT nasal spray USE 1 OR 2 SPRAYS IN EACH NOSTRIL ONCE DAILY.   furosemide (LASIX) 20 MG tablet Take 1 tablet (20 mg total) by mouth daily.   gabapentin (NEURONTIN) 100 MG capsule Take 1 capsule (100 mg total) by mouth 3 (three) times daily.   Glycopyrrolate-Formoterol (BEVESPI AEROSPHERE) 9-4.8 MCG/ACT AERO Inhale 2 puffs into the lungs 2 (two) times daily.   hydrochlorothiazide (MICROZIDE) 12.5 MG capsule Take 1 capsule (12.5 mg total) by mouth daily.   magnesium oxide (MAG-OX) 400 MG tablet Take 400 mg by mouth daily.   metFORMIN (GLUCOPHAGE) 500 MG tablet TAKE 2 TABLETS BY MOUTH TWICE  DAILY WITH MEALS   metoprolol succinate (TOPROL-XL) 25 MG 24 hr tablet Take 1 tablet (25 mg total) by mouth daily.   montelukast (SINGULAIR) 10 MG tablet TAKE 1 TABLET BY MOUTH AT  BEDTIME   Multiple Vitamin (MULTIVITAMIN) tablet Take 1 tablet  by mouth daily.   nystatin (MYCOSTATIN) 100000 UNIT/ML suspension Take 5 mLs (500,000 Units total) by mouth 4 (four) times daily.   ondansetron (ZOFRAN) 4 MG tablet Take 1 tablet (4 mg total) by mouth every 8 (eight) hours as needed for nausea or vomiting.   potassium chloride (KLOR-CON) 10 MEQ tablet Take 1 tablet (10 mEq total) by mouth 2 (two) times daily.   predniSONE (DELTASONE) 10 MG tablet Take 1 tablet (10 mg total) by mouth daily with breakfast.   PROVENTIL HFA 108 (90 Base) MCG/ACT inhaler Inhale 2 puffs into the lungs every 6 (six) hours as needed for wheezing or shortness of breath. 18 gram inhaler   rosuvastatin (CRESTOR) 20 MG tablet Take 1 tablet (20 mg total)  by mouth daily.   saccharomyces boulardii (FLORASTOR) 250 MG capsule Take 1 capsule (250 mg total) by mouth daily.   Semaglutide,0.25 or 0.5MG /DOS, (OZEMPIC, 0.25 OR 0.5 MG/DOSE,) 2 MG/3ML SOPN Inject 0.25 mg weekly for 4 weeks, then increase to 0.5 mg weekly   sodium chloride (OCEAN) 0.65 % SOLN nasal spray Place 2 sprays into both nostrils as needed for congestion.   vitamin C (ASCORBIC ACID) 500 MG tablet Take 1,000 mg by mouth daily.    Vitamin E 180 MG CAPS Take 180 mg by mouth daily.    Immunization History  Administered Date(s) Administered   Influenza, Seasonal, Injecte, Preservative Fre 07/21/2023   Influenza,inj,Quad PF,6+ Mos 07/02/2015, 08/19/2016, 08/07/2017, 06/16/2018, 06/29/2019, 07/31/2020, 07/05/2021, 09/10/2022   Moderna SARS-COV2 Booster Vaccination 11/05/2020   Moderna Sars-Covid-2 Vaccination 03/08/2020, 04/05/2020   PNEUMOCOCCAL CONJUGATE-20 07/21/2023   Pneumococcal Polysaccharide-23 09/26/2015   Pneumococcal-Unspecified 11/23/2020      Objective:   BP 138/80 (BP Location: Right Arm, Cuff Size: Large)   Pulse 89   Temp (!) 97.1 F (36.2 C)   Ht 5\' 6"  (1.676 m)   Wt 250 lb (113.4 kg)   LMP 04/10/2014   SpO2 93%   BMI 40.35 kg/m   SpO2: 93 % O2 Device: None (Room air), Nasal cannula O2 Flow Rate (L/min): 1 L/min O2 Type: Continuous O2  GENERAL: Obese woman, no acute distress, comfortable with nasal cannula O2.  Ambulatory with assistance of cane. HEAD: Normocephalic, atraumatic. EYES: Pupils equal, round, reactive to light.  No scleral icterus. MOUTH: Dentition intact, oral mucosa moist, no thrush. NECK: Supple. No thyromegaly. Trachea midline. No JVD.  No adenopathy. PULMONARY: Mildly diminished breath sounds.  Coarse, otherwise no adventitious sounds.   CARDIOVASCULAR: S1 and S2. Regular rate and rhythm.  No rubs, murmurs or gallops heard. ABDOMEN: Obese, otherwise benign. MUSCULOSKELETAL: No joint deformity, no clubbing, no edema. NEUROLOGIC: No  focal deficit, no gait disturbance, speech is fluent. SKIN: Intact,warm,dry. PSYCH: Mood and behavior normal.    Assessment & Plan:     ICD-10-CM   1. Stage 3 severe COPD by GOLD classification (HCC)  J44.9 AMB referral to pulmonary rehabilitation    Pulmonary Function Test [facility] Only   Repeat PFTs Continue Bevespi twice a day Add Ohtuvayre twice a day Pulmonary rehab    2. Asthma-COPD overlap syndrome (HCC)  J44.89 Pulmonary Function Test [facility] Only   Continue Qvar    3. Chronic respiratory failure with hypoxia and hypercapnia (HCC)  J96.11 AMB referral to pulmonary rehabilitation   J96.12    Continue oxygen at 1 L/min with exertion and 2 L/min with sleep    4. Obesity hypoventilation syndrome (HCC)  E66.2    Patient would benefit from weight loss  5. Multiple lung nodules on CT  R91.8    Being followed through lung cancer screening program    6. Need for RSV immunization  Z29.11 RSV,Recombinant PF (Arexvy)   Received RSV vaccine today      Orders Placed This Encounter  Procedures   RSV,Recombinant PF (Arexvy)   AMB referral to pulmonary rehabilitation    Referral Priority:   Routine    Referral Type:   Consultation    Number of Visits Requested:   1   Pulmonary Function Test [facility] Only    Standing Status:   Future    Standing Expiration Date:   08/18/2024    Order Specific Question:   Full PFT: includes the following: basic spirometry, spirometry pre & post bronchodilator, diffusion capacity (DLCO), lung volumes    Answer:   Full PFT    Order Specific Question:   This test can only be performed at    Answer:   [facility]    Meds ordered this encounter  Medications   Ensifentrine (OHTUVAYRE) 3 MG/2.5ML SUSP    Sig: Take 3 mg by nebulization 2 (two) times daily.    Dispense:  150 mL    Refill:  11   Discussion:    Chronic Obstructive Pulmonary Disease (COPD) COPD with recent exacerbation, including increased dyspnea. Managed with Bevespi and Qvar,  with albuterol PRN. No recent albuterol use. Discussed Ohtuvayre for improved lung function, specialty pharmacy acquisition, and potential financial assistance. Emphasized the need for a specific nebulizer and the importance of pulmonary rehab. - Prescribe Ohtuvayre BID - Refer to pulmonary rehab - Order pulmonary function tests - Follow up in three months  General Health Maintenance Received flu and pneumonia vaccines. No record of RSV vaccination. Discussed RSV vaccine importance and potential side effects, noting some patients tolerate it better than the flu shot. - Administer RSV vaccine today  Follow-up - Follow up in three months.      Gailen Shelter, MD [facility]    *This note was generated using voice recognition software/Dragon and/or AI transcription program.  Despite best efforts to proofread, errors can occur which can change the meaning. Any transcriptional errors that result from this process are unintentional and may not be fully corrected at the time of dictation.

## 2023-08-20 ENCOUNTER — Telehealth: Payer: Self-pay

## 2023-08-20 NOTE — Telephone Encounter (Signed)
PAP: Application for Ozempic has been submitted to PAP Companies: [employer], Retail banker provider pages to PCP

## 2023-08-24 ENCOUNTER — Other Ambulatory Visit: Payer: Self-pay | Admitting: Family Medicine

## 2023-08-24 DIAGNOSIS — R232 Flushing: Secondary | ICD-10-CM

## 2023-08-25 DIAGNOSIS — M5432 Sciatica, left side: Secondary | ICD-10-CM | POA: Diagnosis not present

## 2023-08-25 DIAGNOSIS — M9904 Segmental and somatic dysfunction of sacral region: Secondary | ICD-10-CM | POA: Diagnosis not present

## 2023-08-25 DIAGNOSIS — M9905 Segmental and somatic dysfunction of pelvic region: Secondary | ICD-10-CM | POA: Diagnosis not present

## 2023-08-25 DIAGNOSIS — M955 Acquired deformity of pelvis: Secondary | ICD-10-CM | POA: Diagnosis not present

## 2023-08-26 ENCOUNTER — Telehealth: Payer: Self-pay

## 2023-08-26 NOTE — Telephone Encounter (Signed)
Patient was seen in the office on 08/19/2023. Dr. Jayme Cloud has ordered [facility] for the patient.  She has signed the form and it has been faxed to the pharmacy team for completion.   Pharmacy did you receive the patient's information?

## 2023-08-31 DIAGNOSIS — M13861 Other specified arthritis, right knee: Secondary | ICD-10-CM | POA: Diagnosis not present

## 2023-08-31 DIAGNOSIS — M13862 Other specified arthritis, left knee: Secondary | ICD-10-CM | POA: Diagnosis not present

## 2023-09-01 NOTE — Telephone Encounter (Signed)
Ohtuvayre form received. Sent to [employer] with clinicals and insurance card copy  Phone#: [phone] Fax#: [phone]  Chesley Mires, PharmD, MPH, BCPS, CPP Clinical Pharmacist (Rheumatology and Pulmonology)

## 2023-09-02 NOTE — Telephone Encounter (Signed)
Received fax from [employer]. Patient's Martha Woodard is going to be triaged to [facility].  Patient ID: [id number]  Chesley Mires, PharmD, MPH, BCPS, CPP Clinical Pharmacist (Rheumatology and Pulmonology)

## 2023-09-03 NOTE — Telephone Encounter (Signed)
Printed and placed in provider's basket for signature.

## 2023-09-04 DIAGNOSIS — M5432 Sciatica, left side: Secondary | ICD-10-CM | POA: Diagnosis not present

## 2023-09-04 DIAGNOSIS — M955 Acquired deformity of pelvis: Secondary | ICD-10-CM | POA: Diagnosis not present

## 2023-09-04 DIAGNOSIS — M9905 Segmental and somatic dysfunction of pelvic region: Secondary | ICD-10-CM | POA: Diagnosis not present

## 2023-09-04 DIAGNOSIS — M9904 Segmental and somatic dysfunction of sacral region: Secondary | ICD-10-CM | POA: Diagnosis not present

## 2023-09-04 NOTE — Telephone Encounter (Signed)
Signed. Placed in signed folder.

## 2023-09-07 ENCOUNTER — Telehealth: Payer: Self-pay | Admitting: Pulmonary Disease

## 2023-09-07 MED ORDER — QVAR REDIHALER 80 MCG/ACT IN AERB: 2.0000 | INHALATION_SPRAY | Freq: Two times a day (BID) | RESPIRATORY_TRACT | 3 refills | Status: DC

## 2023-09-07 NOTE — Addendum Note (Signed)
Addended by: Bonney Leitz on: 09/07/2023 02:51 PM   Modules accepted: Orders

## 2023-09-07 NOTE — Telephone Encounter (Signed)
Patient states needs refill for Qvar inhaler. Would like 3 month supply. Pharmacy is [facility] [place]. Patient phone number is [phone].

## 2023-09-07 NOTE — Telephone Encounter (Signed)
Refill has been sent in and I have notified the patient.  Nothing further needed.

## 2023-09-11 ENCOUNTER — Telehealth: Payer: Self-pay | Admitting: Pulmonary Disease

## 2023-09-11 MED ORDER — QVAR REDIHALER 80 MCG/ACT IN AERB: 2.0000 | INHALATION_SPRAY | Freq: Two times a day (BID) | RESPIRATORY_TRACT | 3 refills | Status: DC

## 2023-09-11 NOTE — Telephone Encounter (Signed)
Patient confirmed that she uses [facility] as well. New prescription sent to mail order pharmacy. They do have medication in stock. Patient advised. Nothing further needed.

## 2023-09-11 NOTE — Telephone Encounter (Signed)
 Provider portion received and faxed to [employer] with [employer].

## 2023-09-14 ENCOUNTER — Telehealth: Payer: Self-pay | Admitting: Pulmonary Disease

## 2023-09-14 DIAGNOSIS — J441 Chronic obstructive pulmonary disease with (acute) exacerbation: Secondary | ICD-10-CM

## 2023-09-14 MED ORDER — PROVENTIL HFA 108 (90 BASE) MCG/ACT IN AERS: 2.0000 | INHALATION_SPRAY | Freq: Four times a day (QID) | RESPIRATORY_TRACT | 1 refills | Status: DC | PRN

## 2023-09-14 MED ORDER — BUDESONIDE 0.25 MG/2ML IN SUSP: 0.2500 mg | Freq: Two times a day (BID) | RESPIRATORY_TRACT | 2 refills | Status: DC

## 2023-09-14 NOTE — Telephone Encounter (Signed)
She can have an albuterol inhaler called in.  To substitute for Qvar can send in budesonide 0.25 mg via neb twice a day.  She also should be on Ohtuvayre.

## 2023-09-14 NOTE — Telephone Encounter (Signed)
Spoke to patient and verified below message.   Dr. Jayme Cloud, please advise. Thanks

## 2023-09-14 NOTE — Telephone Encounter (Signed)
Patient is aware below message/recommendations and voiced her understanding.  Pulmicort and albuterol has been sent to preferred pharmacy. She stated that ohtuvayre is not affordable and she did not qualify for insurance. I have made Dr. Carola Frost of this verbally.  Nothing further needed.

## 2023-09-14 NOTE — Telephone Encounter (Signed)
Patient is returning phone call. Patient phone number is [phone].

## 2023-09-14 NOTE — Telephone Encounter (Signed)
Patient is out of Qvar. Both pharmacies that she uses are out of stock of the medication. Patient would like albuterol to be called in for her to use as a substitute until her pharmacies have Qvar back in stock.   Pharmacy: [facility]

## 2023-09-14 NOTE — Telephone Encounter (Signed)
Lm x1 for patient.  

## 2023-09-15 ENCOUNTER — Telehealth: Payer: Self-pay | Admitting: Pulmonary Disease

## 2023-09-15 MED ORDER — ALBUTEROL SULFATE HFA 108 (90 BASE) MCG/ACT IN AERS: 2.0000 | INHALATION_SPRAY | Freq: Four times a day (QID) | RESPIRATORY_TRACT | 2 refills | Status: AC | PRN

## 2023-09-15 NOTE — Telephone Encounter (Signed)
90 days supply of Ventolin has been sent to preferred pharmacy. Patient and Sam w/ [facility] are aware and voiced their understanding.  Nothing further needed.

## 2023-09-15 NOTE — Telephone Encounter (Signed)
Proventil is not covered by insurance. Pharmacist needs a new prescription of Ventail sent in saying 56. It doesn't need to say brand medically necessary unless needed.  [facility]

## 2023-09-16 DIAGNOSIS — M9904 Segmental and somatic dysfunction of sacral region: Secondary | ICD-10-CM | POA: Diagnosis not present

## 2023-09-16 DIAGNOSIS — M955 Acquired deformity of pelvis: Secondary | ICD-10-CM | POA: Diagnosis not present

## 2023-09-16 DIAGNOSIS — M5432 Sciatica, left side: Secondary | ICD-10-CM | POA: Diagnosis not present

## 2023-09-16 DIAGNOSIS — M9905 Segmental and somatic dysfunction of pelvic region: Secondary | ICD-10-CM | POA: Diagnosis not present

## 2023-09-25 DIAGNOSIS — M13861 Other specified arthritis, right knee: Secondary | ICD-10-CM | POA: Diagnosis not present

## 2023-09-25 DIAGNOSIS — M13862 Other specified arthritis, left knee: Secondary | ICD-10-CM | POA: Diagnosis not present

## 2023-09-28 NOTE — Telephone Encounter (Signed)
[employer], automated system stated information was missing from pt portion, have resent full application and insurance information to [employer] via fax.

## 2023-10-05 ENCOUNTER — Other Ambulatory Visit: Payer: Self-pay | Admitting: Family Medicine

## 2023-10-05 DIAGNOSIS — J309 Allergic rhinitis, unspecified: Secondary | ICD-10-CM

## 2023-10-06 DIAGNOSIS — M955 Acquired deformity of pelvis: Secondary | ICD-10-CM | POA: Diagnosis not present

## 2023-10-06 DIAGNOSIS — M5432 Sciatica, left side: Secondary | ICD-10-CM | POA: Diagnosis not present

## 2023-10-06 DIAGNOSIS — M9904 Segmental and somatic dysfunction of sacral region: Secondary | ICD-10-CM | POA: Diagnosis not present

## 2023-10-06 DIAGNOSIS — M9905 Segmental and somatic dysfunction of pelvic region: Secondary | ICD-10-CM | POA: Diagnosis not present

## 2023-10-13 NOTE — Telephone Encounter (Signed)
 PAP: Patient assistance application Ozempic  for has been approved by PAP Companies: [employer] from 09/30/2023 to 09/28/2024. Medication should be delivered to PAP Delivery: Provider's office For further shipping updates, please contact [employer] at [phone] Pt ID is: no ID

## 2023-10-19 ENCOUNTER — Other Ambulatory Visit: Payer: Self-pay | Admitting: Family Medicine

## 2023-10-19 ENCOUNTER — Ambulatory Visit: Payer: Medicare Other | Admitting: Pulmonary Disease

## 2023-10-20 DIAGNOSIS — M9905 Segmental and somatic dysfunction of pelvic region: Secondary | ICD-10-CM | POA: Diagnosis not present

## 2023-10-20 DIAGNOSIS — M955 Acquired deformity of pelvis: Secondary | ICD-10-CM | POA: Diagnosis not present

## 2023-10-20 DIAGNOSIS — M5432 Sciatica, left side: Secondary | ICD-10-CM | POA: Diagnosis not present

## 2023-10-20 DIAGNOSIS — M9904 Segmental and somatic dysfunction of sacral region: Secondary | ICD-10-CM | POA: Diagnosis not present

## 2023-10-21 ENCOUNTER — Ambulatory Visit: Payer: Medicare Other | Admitting: Family Medicine

## 2023-10-21 DIAGNOSIS — R232 Flushing: Secondary | ICD-10-CM

## 2023-10-26 ENCOUNTER — Ambulatory Visit (INDEPENDENT_AMBULATORY_CARE_PROVIDER_SITE_OTHER): Payer: Medicare Other | Admitting: Family Medicine

## 2023-10-26 VITALS — BP 130/70 | HR 74 | Temp 98.9°F | Ht 66.0 in | Wt 251.0 lb

## 2023-10-26 DIAGNOSIS — R232 Flushing: Secondary | ICD-10-CM | POA: Diagnosis not present

## 2023-10-26 DIAGNOSIS — K5900 Constipation, unspecified: Secondary | ICD-10-CM | POA: Insufficient documentation

## 2023-10-26 DIAGNOSIS — Z7984 Long term (current) use of oral hypoglycemic drugs: Secondary | ICD-10-CM

## 2023-10-26 DIAGNOSIS — L659 Nonscarring hair loss, unspecified: Secondary | ICD-10-CM | POA: Diagnosis not present

## 2023-10-26 DIAGNOSIS — J3489 Other specified disorders of nose and nasal sinuses: Secondary | ICD-10-CM | POA: Insufficient documentation

## 2023-10-26 DIAGNOSIS — I10 Essential (primary) hypertension: Secondary | ICD-10-CM | POA: Diagnosis not present

## 2023-10-26 DIAGNOSIS — E1159 Type 2 diabetes mellitus with other circulatory complications: Secondary | ICD-10-CM

## 2023-10-26 DIAGNOSIS — R5383 Other fatigue: Secondary | ICD-10-CM

## 2023-10-26 DIAGNOSIS — Z6841 Body Mass Index (BMI) 40.0 and over, adult: Secondary | ICD-10-CM

## 2023-10-26 DIAGNOSIS — E782 Mixed hyperlipidemia: Secondary | ICD-10-CM | POA: Diagnosis not present

## 2023-10-26 DIAGNOSIS — J439 Emphysema, unspecified: Secondary | ICD-10-CM

## 2023-10-26 DIAGNOSIS — K5903 Drug induced constipation: Secondary | ICD-10-CM

## 2023-10-26 LAB — COMPREHENSIVE METABOLIC PANEL
ALT: 30 U/L (ref 0–35)
AST: 30 U/L (ref 0–37)
Albumin: 4.4 g/dL (ref 3.5–5.2)
Alkaline Phosphatase: 55 U/L (ref 39–117)
BUN: 11 mg/dL (ref 6–23)
CO2: 34 meq/L — ABNORMAL HIGH (ref 19–32)
Calcium: 9.9 mg/dL (ref 8.4–10.5)
Chloride: 102 meq/L (ref 96–112)
Creatinine, Ser: 0.56 mg/dL (ref 0.40–1.20)
GFR: 100.11 mL/min (ref >60.00)
Glucose, Bld: 85 mg/dL (ref 70–99)
Potassium: 3.9 meq/L (ref 3.5–5.1)
Sodium: 141 meq/L (ref 135–145)
Total Bilirubin: 0.3 mg/dL (ref 0.2–1.2)
Total Protein: 6.9 g/dL (ref 6.0–8.3)

## 2023-10-26 LAB — HEMOGLOBIN A1C: Hgb A1c MFr Bld: 6.3 % (ref 4.6–6.5)

## 2023-10-26 LAB — VITAMIN B12: Vitamin B-12: >1537 pg/mL — ABNORMAL HIGH (ref 211–911)

## 2023-10-26 MED ORDER — DEXAMETHASONE 1 MG PO TABS: 1.0000 mg | ORAL_TABLET | Freq: Once | ORAL | 0 refills | Status: AC

## 2023-10-26 MED ORDER — GABAPENTIN 100 MG PO CAPS: 100.0000 mg | ORAL_CAPSULE | Freq: Three times a day (TID) | ORAL | 2 refills | Status: DC

## 2023-10-26 NOTE — Progress Notes (Signed)
Marikay Alar, MD Phone: [phone]  Martha Woodard is a 58 y.o. female who presents today for follow-up.  Hypertension: Not checking blood pressures.  She is taking losartan and metoprolol.  No chest pain.  Generally stable shortness of breath.  Mild chronic edema that is unchanged.  COPD: Patient is now on Pulmicort nebulizers as well as Bevespi and using albuterol as needed.  She is supposed to be on Qvar though her pharmacy cannot keep this in stock.  Notes a little bit of wheezing and a little more cough than usual since being off of her Qvar.  Constipation: Patient reports the Ozempic has been keeping her constipated.  She had a bowel movement this morning.  She takes a stool softener and laxative nightly.  Diabetes: Patient wonders about coming off of the Ozempic given her constipation.  She wonders about testing for cortisol issues given her diabetes, obesity, and hair loss.  Nasal sores: These have been an intermittent ongoing issue.  She uses an ointment from ENT.  Occasionally uses an over-the-counter ointment as well.  Social History   Tobacco Use  Smoking Status Former   Current packs/day: 0.00   Average packs/day: 3.0 packs/day for 34.0 years (102.0 ttl pk-yrs)   Types: Cigarettes   Start date: 07/11/1979   Quit date: 07/10/2013   Years since quitting: 10.3  Smokeless Tobacco Never    Current Outpatient Medications on File Prior to Visit  Medication Sig Dispense Refill   ACCU-CHEK AVIVA PLUS test strip TEST UP TO 4 TIMES DAILY AS DIRECTED 400 strip 3   Accu-Chek Softclix Lancets lancets TEST UP TO 4 TIMES DAILY 400 each 3   acetaminophen (TYLENOL) 650 MG CR tablet Take 650 mg by mouth every 8 (eight) hours as needed for pain.     albuterol (PROVENTIL) (2.5 MG/3ML) 0.083% nebulizer solution Take 3 mLs (2.5 mg total) by nebulization every 6 (six) hours as needed for wheezing or shortness of breath. 125 mL 12   albuterol (VENTOLIN HFA) 108 (90 Base) MCG/ACT inhaler  Inhale 2 puffs into the lungs every 6 (six) hours as needed for wheezing or shortness of breath. 54 g 2   aspirin EC 81 MG tablet Take 1 tablet (81 mg total) by mouth daily. 90 tablet 3   azelastine (ASTELIN) 0.1 % nasal spray Place 2 sprays into both nostrils 2 (two) times daily. Use in each nostril as directed 30 mL 12   blood glucose meter kit and supplies Dispense based on patient and insurance preference. Use up to four times daily as directed. (FOR ICD-9 250.00, 250.01). 1 each 0   budesonide (PULMICORT) 0.25 MG/2ML nebulizer solution Take 2 mLs (0.25 mg total) by nebulization 2 (two) times daily. 120 mL 2   calcium-vitamin D (OSCAL WITH D) 500-200 MG-UNIT tablet Take 1 tablet by mouth 2 (two) times daily.     cetirizine (ZYRTEC) 10 MG tablet Take 10 mg by mouth daily.     Ensifentrine (OHTUVAYRE) 3 MG/2.5ML SUSP Take 3 mg by nebulization 2 (two) times daily. 150 mL 11   fluticasone (FLONASE) 50 MCG/ACT nasal spray USE 1 OR 2 SPRAYS IN EACH NOSTRIL ONCE DAILY. 16 g 12   furosemide (LASIX) 20 MG tablet Take 1 tablet (20 mg total) by mouth daily. 90 tablet 3   Glycopyrrolate-Formoterol (BEVESPI AEROSPHERE) 9-4.8 MCG/ACT AERO Inhale 2 puffs into the lungs 2 (two) times daily.     hydrochlorothiazide (MICROZIDE) 12.5 MG capsule Take 1 capsule (12.5 mg total) by mouth  daily. 90 capsule 3   losartan (COZAAR) 25 MG tablet Take 1 tablet (25 mg total) by mouth 2 (two) times daily for 25 days. 180 tablet 3   magnesium oxide (MAG-OX) 400 MG tablet Take 400 mg by mouth daily.     metFORMIN (GLUCOPHAGE) 500 MG tablet TAKE 2 TABLETS BY MOUTH TWICE  DAILY WITH MEALS 400 tablet 2   metoprolol succinate (TOPROL-XL) 25 MG 24 hr tablet Take 1 tablet (25 mg total) by mouth daily. 90 tablet 3   montelukast (SINGULAIR) 10 MG tablet TAKE 1 TABLET BY MOUTH AT  BEDTIME 100 tablet 2   Multiple Vitamin (MULTIVITAMIN) tablet Take 1 tablet by mouth daily.     nystatin (MYCOSTATIN) 100000 UNIT/ML suspension Take 5 mLs  (500,000 Units total) by mouth 4 (four) times daily. 473 mL 0   ondansetron (ZOFRAN) 4 MG tablet Take 1 tablet (4 mg total) by mouth every 8 (eight) hours as needed for nausea or vomiting. 20 tablet 0   potassium chloride (KLOR-CON) 10 MEQ tablet Take 1 tablet (10 mEq total) by mouth 2 (two) times daily. 180 tablet 3   predniSONE (DELTASONE) 10 MG tablet Take 1 tablet (10 mg total) by mouth daily with breakfast. 10 tablet 0   PROVENTIL HFA 108 (90 Base) MCG/ACT inhaler Inhale 2 puffs into the lungs every 6 (six) hours as needed for wheezing or shortness of breath. 18 gram inhaler 3 each 1   rosuvastatin (CRESTOR) 20 MG tablet Take 1 tablet (20 mg total) by mouth daily. 90 tablet 3   saccharomyces boulardii (FLORASTOR) 250 MG capsule Take 1 capsule (250 mg total) by mouth daily. 90 capsule 0   sodium chloride (OCEAN) 0.65 % SOLN nasal spray Place 2 sprays into both nostrils as needed for congestion. 30 mL 11   vitamin C (ASCORBIC ACID) 500 MG tablet Take 1,000 mg by mouth daily.      Vitamin E 180 MG CAPS Take 180 mg by mouth daily.     No current facility-administered medications on file prior to visit.     ROS see history of present illness  Objective  Physical Exam Vitals:   10/26/23 1203  BP: 130/70  Pulse: 74  Temp: 98.9 F (37.2 C)  SpO2: 92%    BP Readings from Last 3 Encounters:  10/26/23 130/70  08/19/23 138/80  07/21/23 116/72   Wt Readings from Last 3 Encounters:  10/26/23 251 lb (113.9 kg)  08/19/23 250 lb (113.4 kg)  08/17/23 250 lb (113.4 kg)    Physical Exam Constitutional:      General: She is not in acute distress.    Appearance: She is not diaphoretic.  Cardiovascular:     Rate and Rhythm: Normal rate and regular rhythm.     Heart sounds: Normal heart sounds.  Pulmonary:     Effort: Pulmonary effort is normal.     Breath sounds: Normal breath sounds.  Skin:    General: Skin is warm and dry.  Neurological:     Mental Status: She is alert.       Assessment/Plan: Please see individual problem list.  Essential hypertension Assessment & Plan: Chronic issue.  Patient will continue losartan 25 mg twice daily and metoprolol 25 mg daily.  Orders: -     dexAMETHasone; Take 1 tablet (1 mg total) by mouth once for 1 dose. Take at 11 pm the night before lab work.  Dispense: 1 tablet; Refill: 0 -     Cortisol-am, blood; Future  Hot flashes -     Gabapentin; Take 1 capsule (100 mg total) by mouth 3 (three) times daily.  Dispense: 300 capsule; Refill: 2  Type 2 diabetes mellitus with other circulatory complication, without long-term current use of insulin (HCC) Assessment & Plan: Chronic issue.  Check A1c.  Discontinue Ozempic given her constipation.  She will continue metformin 1000 mg twice daily.  I think it is reasonable to check a cortisol level given her body habitus and diabetes history.  Orders: -     Hemoglobin A1c -     dexAMETHasone; Take 1 tablet (1 mg total) by mouth once for 1 dose. Take at 11 pm the night before lab work.  Dispense: 1 tablet; Refill: 0 -     Cortisol-am, blood; Future  Mixed hyperlipidemia -     Comprehensive metabolic panel  Other fatigue -     Vitamin B12  Hair loss -     dexAMETHasone; Take 1 tablet (1 mg total) by mouth once for 1 dose. Take at 11 pm the night before lab work.  Dispense: 1 tablet; Refill: 0 -     Cortisol-am, blood; Future  Morbid obesity with BMI of 45.0-49.9, adult (HCC) -     dexAMETHasone; Take 1 tablet (1 mg total) by mouth once for 1 dose. Take at 11 pm the night before lab work.  Dispense: 1 tablet; Refill: 0 -     Cortisol-am, blood; Future  Sore in nose Assessment & Plan: She can continue topical treatment through ENT.   Pulmonary emphysema, unspecified emphysema type (HCC) Assessment & Plan: Chronic issue.  Slightly worse off of her Qvar.  She notes she is supposed to get this in the near future.  For now she will continue her Pulmicort nebulizers, Bevespi,  and albuterol as needed.   Drug-induced constipation Assessment & Plan: We will see if this improves coming off of Ozempic.  She can continue over the counter stool softener and laxative as needed.     Return in about 1 week (around 11/02/2023) for 8 am lab, 3 months transfer of care.   Marikay Alar, MD [facility]

## 2023-10-26 NOTE — Assessment & Plan Note (Signed)
Chronic issue.  Check A1c.  Discontinue Ozempic given her constipation.  She will continue metformin 1000 mg twice daily.  I think it is reasonable to check a cortisol level given her body habitus and diabetes history.

## 2023-10-26 NOTE — Assessment & Plan Note (Addendum)
We will see if this improves coming off of Ozempic.  She can continue over the counter stool softener and laxative as needed.

## 2023-10-26 NOTE — Assessment & Plan Note (Signed)
Chronic issue.  Patient will continue losartan 25 mg twice daily and metoprolol 25 mg daily.

## 2023-10-26 NOTE — Assessment & Plan Note (Signed)
Chronic issue.  Slightly worse off of her Qvar.  She notes she is supposed to get this in the near future.  For now she will continue her Pulmicort nebulizers, Bevespi, and albuterol as needed.

## 2023-10-26 NOTE — Assessment & Plan Note (Signed)
She can continue topical treatment through ENT.

## 2023-10-26 NOTE — Patient Instructions (Signed)
Stop the ozempic. If the constipation does not improve please let us know.

## 2023-10-27 ENCOUNTER — Other Ambulatory Visit: Payer: Self-pay | Admitting: Family Medicine

## 2023-10-27 ENCOUNTER — Other Ambulatory Visit: Payer: Self-pay

## 2023-10-27 DIAGNOSIS — R110 Nausea: Secondary | ICD-10-CM

## 2023-10-27 DIAGNOSIS — J0140 Acute pansinusitis, unspecified: Secondary | ICD-10-CM

## 2023-10-27 MED ORDER — FLUTICASONE PROPIONATE 50 MCG/ACT NA SUSP: NASAL | 3 refills | Status: DC

## 2023-10-27 MED ORDER — ONDANSETRON HCL 4 MG PO TABS: 4.0000 mg | ORAL_TABLET | Freq: Three times a day (TID) | ORAL | 0 refills | Status: DC | PRN

## 2023-10-30 DIAGNOSIS — E119 Type 2 diabetes mellitus without complications: Secondary | ICD-10-CM | POA: Diagnosis not present

## 2023-10-30 DIAGNOSIS — M1712 Unilateral primary osteoarthritis, left knee: Secondary | ICD-10-CM | POA: Diagnosis not present

## 2023-11-09 DIAGNOSIS — M955 Acquired deformity of pelvis: Secondary | ICD-10-CM | POA: Diagnosis not present

## 2023-11-09 DIAGNOSIS — M5432 Sciatica, left side: Secondary | ICD-10-CM | POA: Diagnosis not present

## 2023-11-09 DIAGNOSIS — M9905 Segmental and somatic dysfunction of pelvic region: Secondary | ICD-10-CM | POA: Diagnosis not present

## 2023-11-09 DIAGNOSIS — M9904 Segmental and somatic dysfunction of sacral region: Secondary | ICD-10-CM | POA: Diagnosis not present

## 2023-11-17 ENCOUNTER — Telehealth: Payer: Self-pay | Admitting: Pulmonary Disease

## 2023-11-17 ENCOUNTER — Other Ambulatory Visit: Payer: Self-pay | Admitting: Pulmonary Disease

## 2023-11-17 ENCOUNTER — Other Ambulatory Visit (INDEPENDENT_AMBULATORY_CARE_PROVIDER_SITE_OTHER): Payer: Medicare Other

## 2023-11-17 DIAGNOSIS — E1159 Type 2 diabetes mellitus with other circulatory complications: Secondary | ICD-10-CM | POA: Diagnosis not present

## 2023-11-17 DIAGNOSIS — I10 Essential (primary) hypertension: Secondary | ICD-10-CM

## 2023-11-17 DIAGNOSIS — L659 Nonscarring hair loss, unspecified: Secondary | ICD-10-CM

## 2023-11-17 MED ORDER — BUDESONIDE 0.5 MG/2ML IN SUSP: RESPIRATORY_TRACT | 11 refills | Status: DC

## 2023-11-17 NOTE — Telephone Encounter (Signed)
 Patient advised that prescription for budesonide 0.5 mg was sent to [facility].  Patient requesting clarification on sig: 1/2 vial twice daily? Dr. Jayme Cloud please advise.

## 2023-11-17 NOTE — Telephone Encounter (Signed)
 The Sig. should be 1 vial (2 mL) twice a day via nebulizer.

## 2023-11-17 NOTE — Telephone Encounter (Signed)
 Patient advised. Nothing further needed.

## 2023-11-17 NOTE — Telephone Encounter (Signed)
 Patient needs refill of budesonide (PULMICORT) 0.5 MG/2ML nebulizer solution   Pharmacy: [facility]

## 2023-11-18 LAB — CORTISOL-AM, BLOOD: Cortisol - AM: 2.5 ug/dL — ABNORMAL LOW

## 2023-11-19 ENCOUNTER — Ambulatory Visit: Payer: Medicare Other

## 2023-11-19 ENCOUNTER — Ambulatory Visit: Payer: Medicare Other | Admitting: Pulmonary Disease

## 2023-11-20 ENCOUNTER — Other Ambulatory Visit: Payer: Self-pay | Admitting: Family Medicine

## 2023-11-20 DIAGNOSIS — R947 Abnormal results of other endocrine function studies: Secondary | ICD-10-CM

## 2023-11-23 DIAGNOSIS — M9905 Segmental and somatic dysfunction of pelvic region: Secondary | ICD-10-CM | POA: Diagnosis not present

## 2023-11-23 DIAGNOSIS — M5432 Sciatica, left side: Secondary | ICD-10-CM | POA: Diagnosis not present

## 2023-11-23 DIAGNOSIS — M955 Acquired deformity of pelvis: Secondary | ICD-10-CM | POA: Diagnosis not present

## 2023-11-23 DIAGNOSIS — M9904 Segmental and somatic dysfunction of sacral region: Secondary | ICD-10-CM | POA: Diagnosis not present

## 2023-11-27 ENCOUNTER — Telehealth: Payer: Self-pay

## 2023-11-27 ENCOUNTER — Other Ambulatory Visit: Payer: Self-pay | Admitting: Cardiovascular Disease

## 2023-11-27 DIAGNOSIS — I491 Atrial premature depolarization: Secondary | ICD-10-CM

## 2023-11-27 NOTE — Telephone Encounter (Signed)
 Received [facility] PAP Ozempic 0.25/0.5mg  x 4 boxes.  Medication was discontinued by Dr. Birdie Sons 10/26/23 due to constipation.  Attempted to call [facility] to stop further shipments.  Waited on hold for 15 minutes and was not able to connect with customer service.  Faxed a cover sheet to [employer] at [phone].  Fax confirmation received.

## 2023-12-07 DIAGNOSIS — M5432 Sciatica, left side: Secondary | ICD-10-CM | POA: Diagnosis not present

## 2023-12-07 DIAGNOSIS — M9904 Segmental and somatic dysfunction of sacral region: Secondary | ICD-10-CM | POA: Diagnosis not present

## 2023-12-07 DIAGNOSIS — M9905 Segmental and somatic dysfunction of pelvic region: Secondary | ICD-10-CM | POA: Diagnosis not present

## 2023-12-07 DIAGNOSIS — M955 Acquired deformity of pelvis: Secondary | ICD-10-CM | POA: Diagnosis not present

## 2023-12-10 ENCOUNTER — Ambulatory Visit: Payer: Medicare Other | Admitting: Pulmonary Disease

## 2023-12-10 ENCOUNTER — Encounter: Payer: Self-pay | Admitting: Pulmonary Disease

## 2023-12-10 VITALS — BP 130/78 | HR 83 | Temp 96.9°F | Ht 66.0 in | Wt 248.0 lb

## 2023-12-10 DIAGNOSIS — J9612 Chronic respiratory failure with hypercapnia: Secondary | ICD-10-CM

## 2023-12-10 DIAGNOSIS — Z6841 Body Mass Index (BMI) 40.0 and over, adult: Secondary | ICD-10-CM

## 2023-12-10 DIAGNOSIS — R918 Other nonspecific abnormal finding of lung field: Secondary | ICD-10-CM | POA: Diagnosis not present

## 2023-12-10 DIAGNOSIS — E662 Morbid (severe) obesity with alveolar hypoventilation: Secondary | ICD-10-CM

## 2023-12-10 DIAGNOSIS — Z87891 Personal history of nicotine dependence: Secondary | ICD-10-CM

## 2023-12-10 DIAGNOSIS — J449 Chronic obstructive pulmonary disease, unspecified: Secondary | ICD-10-CM

## 2023-12-10 DIAGNOSIS — J4489 Other specified chronic obstructive pulmonary disease: Secondary | ICD-10-CM | POA: Diagnosis not present

## 2023-12-10 DIAGNOSIS — Z9981 Dependence on supplemental oxygen: Secondary | ICD-10-CM

## 2023-12-10 DIAGNOSIS — J9611 Chronic respiratory failure with hypoxia: Secondary | ICD-10-CM

## 2023-12-10 NOTE — Progress Notes (Signed)
 Subjective:    Patient ID: Martha Woodard, female    DOB: [birth year], 58 y.o.   MRN: [id number]  Patient Care Team: [name], MD (Inactive) as PCP - General (Family Medicine) Antonieta Iba, MD as Consulting Physician (Cardiology) Salena Saner, MD as Consulting Physician (Pulmonary Disease) Myrene Galas, OD as Referring Physician [facility])  Chief Complaint  Patient presents with   Follow-up    DOE. Wheezing. Cough with white/yellowish.    BACKGROUND/INTERVAL:Martha Woodard is a 58 year old former smoker who presents today for follow-up asthma/COPD overlap syndrome. She is obtained on Bevespi and Pulmicort neb solution.  She was last seen on 19 August 2023.  No exacerbations since then.  She has noted increased shortness of breath gradually since her last visit.   PROBLEMS: Chronic hypoxic/hypercarbic respiratory failure Former smoker Moderate/severe COPD [facility] CHF Multiple pulmonary nodules   HPI Discussed the use of AI scribe software for clinical note transcription with the patient, who gave verbal consent to proceed.  History of Present Illness   The patient, with COPD, obesity hypoventilation syndrome, and chronic hypoxic respiratory failure, presents for follow-up.  She has a history of COPD and chronic hypoxic respiratory failure. She uses a nebulizer with budesonide twice daily and Bevespi, which she finds effective but notices when it wears off. Ventolin is used as needed. Her insurance now covers [place], which was previously too expensive.  She has obesity with hypoventilation, which has hindered her ability to participate in a recommended exercise program due to knee issues that prevent her from using a bike.  She experiences severe muscle spasms, particularly at night, causing significant pain. She takes magnesium 400 mg daily and has had muscle cramps in the past, but not spasms of this severity. She also takes B12 vitamins every other day due to  previous low energy levels.  She has knee osteoarthritis, with X-rays showing bone-on-bone contact and spurs. Cortisone injections in December and January have alleviated the pain, but muscle spasms continue to affect her foot position.  She has been referred to an endocrinologist due to low cortisol levels, which may indicate adrenal insufficiency. Her B12 levels were high, attributed to her supplementation.  She reports episodes of ankle swelling, which have since reduced. A previous breathing test was canceled due to inclement weather on the day of the test and has not yet been rescheduled.    DATA: 09/28/2015 2D echo: EF 60 to 65%, grade 1 DD, mild left atrial dilation.  Right-sided pressures normal. 02/28/2020 PFTs:FEV1 1.42L or 49%, FVC or 2.24L 60%, FEV1/FVC 63%, no bronchodilator response.  RV is 122% indicating air trapping.  Diffusion capacity normal.  Consistent with moderate to severe COPD. 04/05/2020 chest x-ray: No active disease.  Consistent with COPD, left base scarring/atelectasis. 01/21/2022 LDCT: Mild to moderate centrilobular emphysema.  Multiple bilateral pulmonary nodules of which the larger were present on 01/17/2016 exam.  Some new nodules from 2017 exam.  Coronary artery calcifications.  Lung RADS 3 short-term follow-up 6 months ordered 07/16/2022 LDCT chest: Mild paraseptal and centrilobular emphysema use bronchial wall thickening.  Numerous scattered solid pulmonary nodules, solitary new basilar right upper lobe pulmonary nodule measuring 4.8 mm.  Lung RADS 3 probably benign finding short-term follow-up in 6 months recommended 09/15/2022 chest x-ray PA and lateral: Hyperinflation of the lungs, flattening of the diaphragms consistent with COPD, bronchial cuffing and increased interstitial markings consistent with bronchitis.  Increased focal opacity in right base could represent atelectasis or early developing pneumonia, chronic opacity in  the left base consistent with scar  atelectasis. 12/04/2022 chest x-ray PA and lateral: COPD with basilar scarring, no acute findings.  Review of Systems A 10 point review of systems was performed and it is as noted above otherwise negative.   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   Light headedness 06/17/2018   Allergic rhinitis 11/09/2017   Venous insufficiency 08/07/2017   Chronic respiratory failure (HCC) 11/13/2016   Thyroid nodule 11/28/2015   COPD (chronic obstructive pulmonary disease) with emphysema (HCC) 11/13/2015   Chronic diastolic heart failure (HCC) 11/13/2015   Morbid obesity with BMI of 45.0-49.9, adult (HCC) 10/01/2015   Obesity hypoventilation syndrome (HCC) 10/01/2015   Essential hypertension 10/01/2015   DM type 2 (diabetes mellitus, type 2) (HCC) 06/26/2014   Hyperlipidemia 06/26/2014    Social History   Tobacco Use   Smoking status: Former    Current packs/day: 0.00    Average packs/day: 3.0 packs/day for 34.0 years (102.0 ttl pk-yrs)    Types: Cigarettes    Start date: 07/11/1979    Quit date: 07/10/2013    Years since quitting: 10.4   Smokeless tobacco: Never  Substance Use Topics   Alcohol use: No    Alcohol/week: 0.0 standard drinks of alcohol    Allergies  Allergen Reactions   Amoxicillin-Pot Clavulanate Itching and Hives    Hives  Can take PCN and Amoxicillin alone    Current Meds  Medication Sig   ACCU-CHEK AVIVA PLUS test strip TEST UP TO 4 TIMES DAILY AS DIRECTED   Accu-Chek Softclix Lancets lancets TEST UP TO 4 TIMES DAILY   acetaminophen (TYLENOL) 650 MG CR tablet Take 650 mg by mouth every 8 (eight) hours as needed for pain.   albuterol (PROVENTIL) (2.5 MG/3ML) 0.083% nebulizer  solution Take 3 mLs (2.5 mg total) by nebulization every 6 (six) hours as needed for wheezing or shortness of breath.   albuterol (VENTOLIN HFA) 108 (90 Base) MCG/ACT inhaler Inhale 2 puffs into the lungs every 6 (six) hours as needed for wheezing or shortness of breath.   aspirin EC 81 MG tablet Take 1 tablet (81 mg total) by mouth daily.   azelastine (ASTELIN) 0.1 % nasal spray Place 2 sprays into both nostrils 2 (two) times daily. Use in each nostril as directed   blood glucose meter kit and supplies Dispense based on patient and insurance preference. Use up to four times daily as directed. (FOR ICD-9 250.00, 250.01).   budesonide (PULMICORT) 0.5 MG/2ML nebulizer solution Use 1 vial twice a daily as directed.   calcium-vitamin D (OSCAL WITH D) 500-200 MG-UNIT tablet Take 1 tablet by mouth 2 (two) times daily.   cetirizine (ZYRTEC) 10 MG tablet Take 10 mg by mouth daily.   fluticasone (FLONASE) 50 MCG/ACT nasal spray USE 1 OR 2 SPRAYS IN EACH NOSTRIL ONCE DAILY.   furosemide (LASIX) 20 MG tablet TAKE 1 TABLET BY MOUTH DAILY   gabapentin (NEURONTIN) 100 MG capsule Take 1 capsule (100 mg total) by mouth 3 (three) times daily.   Glycopyrrolate-Formoterol (BEVESPI AEROSPHERE) 9-4.8 MCG/ACT AERO Inhale 2 puffs into the lungs 2 (two) times daily.   hydrochlorothiazide (MICROZIDE) 12.5 MG capsule TAKE 1 CAPSULE BY MOUTH  DAILY   losartan (COZAAR) 25 MG tablet TAKE 1 TABLET BY MOUTH TWICE  DAILY   magnesium oxide (MAG-OX) 400 MG tablet Take 400 mg by mouth daily.   metFORMIN (GLUCOPHAGE) 500 MG tablet TAKE 2 TABLETS BY MOUTH TWICE  DAILY WITH MEALS   metoprolol succinate (TOPROL-XL) 25 MG 24 hr tablet TAKE 1 TABLET BY MOUTH DAILY   montelukast (SINGULAIR) 10 MG tablet TAKE 1 TABLET BY MOUTH AT  BEDTIME   Multiple Vitamin (MULTIVITAMIN) tablet Take 1 tablet by mouth daily.   nystatin (MYCOSTATIN) 100000 UNIT/ML suspension Take 5 mLs (500,000 Units total) by mouth 4 (four) times daily.   ondansetron  (ZOFRAN) 4 MG tablet Take 1 tablet (4 mg total) by mouth every 8 (eight) hours as needed for nausea or vomiting.   potassium chloride (KLOR-CON) 10 MEQ tablet Take 1 tablet (10 mEq total) by mouth 2 (two) times daily.   rosuvastatin (CRESTOR) 20 MG tablet TAKE 1 TABLET BY MOUTH DAILY   saccharomyces boulardii (FLORASTOR) 250 MG capsule Take 1 capsule (250 mg total) by mouth daily.   sodium chloride (OCEAN) 0.65 % SOLN nasal spray Place 2 sprays into both nostrils as needed for congestion.   vitamin C (ASCORBIC ACID) 500 MG tablet Take 1,000 mg by mouth daily.    Vitamin E 180 MG CAPS Take 180 mg by mouth daily.    Immunization History  Administered Date(s) Administered   Influenza, Seasonal, Injecte, Preservative Fre 07/21/2023   Influenza,inj,Quad PF,6+ Mos 07/02/2015, 08/19/2016, 08/07/2017, 06/16/2018, 06/29/2019, 07/31/2020, 07/05/2021, 09/10/2022   Moderna SARS-COV2 Booster Vaccination 11/05/2020   Moderna Sars-Covid-2 Vaccination 03/08/2020, 04/05/2020   PNEUMOCOCCAL CONJUGATE-20 07/21/2023   Pneumococcal Polysaccharide-23 09/26/2015   Pneumococcal-Unspecified 11/23/2020   Respiratory Syncytial Virus Vaccine,Recomb Aduvanted(Arexvy) 08/19/2023        Objective:     BP 130/78 (BP Location: Right Arm, Cuff Size: Large)   Pulse 83   Temp (!) 96.9 F (36.1 C)   Ht 5\' 6"  (1.676 m)   Wt 248 lb (112.5 kg)   LMP 04/10/2014   SpO2 93%   BMI 40.03 kg/m   SpO2: 93 % O2 Device: Nasal cannula O2 Flow Rate (L/min): 1 L/min O2 Type: Continuous O2  GENERAL: Obese woman, no acute distress, comfortable with nasal cannula O2.  Ambulatory with assistance of cane. HEAD: Normocephalic, atraumatic. EYES: Pupils equal, round, reactive to light.  No scleral icterus. MOUTH: Dentition intact, oral mucosa moist, no thrush. NECK: Supple. No thyromegaly. Trachea midline. No JVD.  No adenopathy. PULMONARY: Mildly diminished breath sounds.  Coarse, otherwise no adventitious sounds.    CARDIOVASCULAR: S1 and S2. Regular rate and rhythm.  No rubs, murmurs or gallops heard. ABDOMEN: Obese, otherwise benign. MUSCULOSKELETAL: No joint deformity, no clubbing, no edema. NEUROLOGIC: No focal deficit, no gait disturbance, speech is fluent. SKIN: Intact,warm,dry. PSYCH: Mood and behavior normal.   Assessment & Plan:     ICD-10-CM   1. Stage 3 severe COPD by GOLD classification (HCC)  J44.9     2. Asthma-COPD overlap syndrome (HCC)  J44.89     3. Obesity hypoventilation syndrome (HCC)  E66.2     4. Chronic respiratory failure with hypoxia and hypercapnia (HCC)  J96.11    J96.12     5. Multiple lung nodules on CT  R91.8      Discussion:    Chronic Obstructive Pulmonary Disease (COPD) with Obesity Hypoventilation Syndrome and Chronic Hypoxic Respiratory Failure COPD is complicated by obesity hypoventilation syndrome and chronic hypoxic respiratory failure. Current  regimen includes nebulized budesonide, Bevespi, and Ventolin as needed. Bevespi is effective but its effect diminishes over time. Optimization of inhaler regimen is needed. Insurance coverage for Lovenia Shuck may now be available. - Use Bevespi first, followed by Pulmicort or budesonide. - Resubmit insurance for [employer] to verify coverage. - Schedule repeat pulmonary function test.  Muscle Spasms Severe nocturnal muscle spasms cause significant discomfort. She takes 400 mg of magnesium daily. Spasms are not clearly linked to inhaler use. Further evaluation of trace mineral levels is warranted. Acupuncture has provided some relief. - Check magnesium level and trace minerals with primary care provider. - Consider B6 vitamin supplementation if needed. - Follow-up with primary provider with regards to this issue.  Osteoarthritis Osteoarthritis with bone-on-bone contact and spurs in knees. Cortisone injections provide relief. Muscle spasms may be related to osteoarthritis. - Continue cortisone injections as needed. -  Consult with primary care or orthopedic specialist if symptoms worsen.  Adrenal Insufficiency Low cortisol levels suggest adrenal insufficiency or Addison's disease.  Referral to endocrinologist for further evaluation is in place. - Follow up with endocrinologist for evaluation of adrenal insufficiency.  Vitamin B12 Supplementation B12 supplements taken for low energy levels have resulted in high B12 levels. Intake adjusted to every other day, considering further reduction. - Reduce B12 supplementation to once a week.  Follow-up Scheduled for follow-up to reassess conditions and treatment efficacy. - Schedule follow-up appointment in three months.      Advised if symptoms do not improve or worsen, to please contact office for sooner follow up or seek emergency care.    I spent 42 minutes of dedicated to the care of this patient on the date of this encounter to include pre-visit review of records, face-to-face time with the patient discussing conditions above, post visit ordering of testing, clinical documentation with the electronic health record, making appropriate referrals as documented, and communicating necessary findings to members of the patients care team.     C. Danice Goltz, MD [facility]    *This note was generated using voice recognition software/Dragon and/or AI transcription program.  Despite best efforts to proofread, errors can occur which can change the meaning. Any transcriptional errors that result from this process are unintentional and may not be fully corrected at the time of dictation.

## 2023-12-10 NOTE — Patient Instructions (Addendum)
 VISIT SUMMARY:  You came in today for a follow-up visit to discuss your COPD, obesity hypoventilation syndrome, chronic hypoxic respiratory failure, muscle spasms, knee osteoarthritis, and possible adrenal insufficiency. We reviewed your current medications and discussed some adjustments to better manage your symptoms. We also talked about your recent lab results and the need for further evaluation by specialists.  YOUR PLAN:  -CHRONIC OBSTRUCTIVE PULMONARY DISEASE (COPD) WITH OBESITY HYPOVENTILATION SYNDROME AND CHRONIC HYPOXIC RESPIRATORY FAILURE: COPD is a chronic lung disease that makes it hard to breathe, and it is complicated by obesity hypoventilation syndrome and chronic hypoxic respiratory failure, which means your body struggles to get enough oxygen. Continue using Bevespi first, followed by Pulmicort or budesonide. We will resubmit your insurance for [employer] to verify coverage and re schedule a repeat pulmonary function test.  -MUSCLE SPASMS: Muscle spasms are sudden, involuntary muscle contractions that can be very painful. We will check your magnesium level and trace minerals with your primary care provider and consider B6 vitamin supplementation if needed.  You state acupuncture has provided some relief, so you may continue with that.  -OSTEOARTHRITIS: Osteoarthritis is a joint disease that causes pain and stiffness, often due to wear and tear. Your knee X-rays show bone-on-bone contact and spurs. Continue with cortisone injections as needed and consult with your primary care or orthopedic specialist if symptoms worsen.  -ADRENAL INSUFFICIENCY: Adrenal insufficiency is when your adrenal glands do not produce enough hormones, which can affect your overall health.  Your cortisol level which is the main hormone that the adrenal gland produces is low.  Follow up with the endocrinologist for further evaluation.  -VITAMIN B12 SUPPLEMENTATION: You have been taking B12 supplements for low  energy levels, but your B12 levels are now high. Reduce your B12 supplementation to once a week.  -MULTIPLE LUNG NODULES: Your last lung screening test was in October 2023, you are past due, we will notify the lung screening program that you need to be reinstated.  INSTRUCTIONS:  Please schedule a follow-up appointment in three months to reassess your conditions and the effectiveness of your treatment. Additionally, schedule a repeat pulmonary function test and follow up with your primary care provider to check your magnesium level and trace minerals. Continue with your referral to the endocrinologist for evaluation of adrenal insufficiency.

## 2023-12-10 NOTE — Telephone Encounter (Signed)
 Patient was seen in the office today. She did not get the [facility] previously because of cost. Dr. Jayme Cloud wants to know if you can try to resubmit it through her new insurance?

## 2023-12-14 NOTE — Telephone Encounter (Signed)
 I have notified the patient and I have mailed her the form to sign. She will return it to our office once it is signed.  Holding message to ensure completion.

## 2023-12-15 ENCOUNTER — Telehealth: Payer: Self-pay | Admitting: Pulmonary Disease

## 2023-12-15 DIAGNOSIS — J9611 Chronic respiratory failure with hypoxia: Secondary | ICD-10-CM

## 2023-12-15 DIAGNOSIS — J449 Chronic obstructive pulmonary disease, unspecified: Secondary | ICD-10-CM

## 2023-12-15 NOTE — Telephone Encounter (Signed)
 Order has been placed.

## 2023-12-15 NOTE — Telephone Encounter (Signed)
 Dr. Jayme Cloud you saw this patient on 12/10/23 and the DME [employer] has requested an order for 02 and supplies be sent to them for the patient

## 2023-12-15 NOTE — Telephone Encounter (Signed)
 Is she changing DME? If so, ok to send order.

## 2023-12-16 ENCOUNTER — Telehealth: Payer: Self-pay | Admitting: Family Medicine

## 2023-12-16 NOTE — Telephone Encounter (Signed)
 Dr Clent Ridges is leaving the practice and your New Patient or Transfer of Care appointment needs to be rescheduled with another provider. Please call the office to schedule a Transfer of Care to either Dr Charlann Lange, Darleen Crocker or Kara Dies, NP.  [facility] for patient. E2C2, please reschedule this patient's TOC visit   Thank you

## 2023-12-16 NOTE — Telephone Encounter (Signed)
 Waiting on completed note

## 2023-12-17 ENCOUNTER — Telehealth: Payer: Self-pay

## 2023-12-17 NOTE — Telephone Encounter (Signed)
 Copied from CRM [id number]. Topic: Referral - Question >> Dec 17, 2023 10:10 AM Denese Killings wrote: Reason for CRM: Joyce Gross with [facility] received a fax from clinic and wants to know if Dr. Antony Haste is referring patient to Pulmonary or Endocrinology.

## 2023-12-28 ENCOUNTER — Encounter: Payer: Self-pay | Admitting: Pulmonary Disease

## 2023-12-28 DIAGNOSIS — M9905 Segmental and somatic dysfunction of pelvic region: Secondary | ICD-10-CM | POA: Diagnosis not present

## 2023-12-28 DIAGNOSIS — M9904 Segmental and somatic dysfunction of sacral region: Secondary | ICD-10-CM | POA: Diagnosis not present

## 2023-12-28 DIAGNOSIS — M5432 Sciatica, left side: Secondary | ICD-10-CM | POA: Diagnosis not present

## 2023-12-28 DIAGNOSIS — M955 Acquired deformity of pelvis: Secondary | ICD-10-CM | POA: Diagnosis not present

## 2024-01-01 NOTE — Telephone Encounter (Signed)
 The form has been returned. It has been signed by Dr. Jayme Cloud and faxed to the pharmacy team for completion.

## 2024-01-05 ENCOUNTER — Telehealth: Payer: Self-pay | Admitting: Pharmacist

## 2024-01-05 NOTE — Telephone Encounter (Signed)
 Received [employer] new start paperwork. Completed form and faxed with clinicals and insurance card copy to [facility]   Phone#: [phone] Fax#: [phone]

## 2024-01-06 NOTE — Telephone Encounter (Signed)
 Received fax from [employer] with summary of benefits. Referral form for [facility] received. Rx will be triaged to [facility].. Once benefits investigation completed, pharmacy will reach out the patient to schedule shipment. If medication is unaffordable, patient will need to express financial hardship to be referred back to [employer] for patient assistance program pre-screening.   Patient ID: [id number] Pharmacy phone: [phone] [place] [facility] Phone#: [phone]

## 2024-01-07 NOTE — Telephone Encounter (Signed)
 I have spoke with Diane with [employer] the 02 concentrator was delivered by [place] on 12/17/23

## 2024-01-08 ENCOUNTER — Ambulatory Visit: Payer: Self-pay

## 2024-01-08 ENCOUNTER — Other Ambulatory Visit: Payer: Self-pay | Admitting: Cardiovascular Disease

## 2024-01-08 NOTE — Telephone Encounter (Signed)
 I spoke with the patient. She said she does not qualify for the assistance program and can not pay $500 a month for the [facility]. I told her I would like Dr. Jayme Cloud know.

## 2024-01-08 NOTE — Telephone Encounter (Signed)
 LMTCB for the patient to call back. She needs to call [employer] to be enrolled in their patient assistance program, [id number].  E2C2 please advise when patient calls back.

## 2024-01-08 NOTE — Telephone Encounter (Signed)
 Noted.

## 2024-01-08 NOTE — Telephone Encounter (Signed)
 Patient called in to notify Pulmonology of an inability to afford medication. See attached note from Murrell Redden, RPH-CPP   Please contact patient regarding necessary actions going forward.    Received fax from [employer] with summary of benefits. Referral form for [facility] received. Rx will be triaged to [facility].. Once benefits investigation completed, pharmacy will reach out the patient to schedule shipment. If medication is unaffordable, patient will need to express financial hardship to be referred back to [employer] for patient assistance program pre-screening.   Patient ID: [id number] Pharmacy phone: [phone] [place] [facility] Phone#: [phone]

## 2024-01-08 NOTE — Telephone Encounter (Signed)
 Pt calling. this am to let the nurse know she is unable to afford this medication at over $500 /mo.   Pt states she was hoping she could afford this, but she cannot. Please advise

## 2024-01-08 NOTE — Telephone Encounter (Signed)
 Patient is saying she has tried the patient assistance program and she was denied . Patient is saying she can't afford the medication . I relayed the information I see from Ms. Foye Clock  and that is what the patient said and now she is asking to speak with Ms Foye Clock after I have relayed the information

## 2024-01-08 NOTE — Telephone Encounter (Signed)
 See telephone encounter from 4/8.  Closing this encounter.

## 2024-01-18 DIAGNOSIS — E139 Other specified diabetes mellitus without complications: Secondary | ICD-10-CM | POA: Diagnosis not present

## 2024-01-18 DIAGNOSIS — M955 Acquired deformity of pelvis: Secondary | ICD-10-CM | POA: Diagnosis not present

## 2024-01-18 DIAGNOSIS — M13861 Other specified arthritis, right knee: Secondary | ICD-10-CM | POA: Diagnosis not present

## 2024-01-18 DIAGNOSIS — M13862 Other specified arthritis, left knee: Secondary | ICD-10-CM | POA: Diagnosis not present

## 2024-01-18 DIAGNOSIS — M5432 Sciatica, left side: Secondary | ICD-10-CM | POA: Diagnosis not present

## 2024-01-18 DIAGNOSIS — M9904 Segmental and somatic dysfunction of sacral region: Secondary | ICD-10-CM | POA: Diagnosis not present

## 2024-01-18 DIAGNOSIS — M9905 Segmental and somatic dysfunction of pelvic region: Secondary | ICD-10-CM | POA: Diagnosis not present

## 2024-01-22 ENCOUNTER — Encounter: Payer: Self-pay | Admitting: Family Medicine

## 2024-01-22 ENCOUNTER — Ambulatory Visit (INDEPENDENT_AMBULATORY_CARE_PROVIDER_SITE_OTHER): Payer: Medicare Other | Admitting: Family Medicine

## 2024-01-22 VITALS — BP 130/82 | HR 82 | Temp 97.5°F | Ht 66.0 in | Wt 248.0 lb

## 2024-01-22 DIAGNOSIS — E118 Type 2 diabetes mellitus with unspecified complications: Secondary | ICD-10-CM | POA: Diagnosis not present

## 2024-01-22 DIAGNOSIS — I5032 Chronic diastolic (congestive) heart failure: Secondary | ICD-10-CM

## 2024-01-22 DIAGNOSIS — E1159 Type 2 diabetes mellitus with other circulatory complications: Secondary | ICD-10-CM | POA: Diagnosis not present

## 2024-01-22 DIAGNOSIS — I152 Hypertension secondary to endocrine disorders: Secondary | ICD-10-CM

## 2024-01-22 DIAGNOSIS — B370 Candidal stomatitis: Secondary | ICD-10-CM

## 2024-01-22 DIAGNOSIS — E1169 Type 2 diabetes mellitus with other specified complication: Secondary | ICD-10-CM

## 2024-01-22 DIAGNOSIS — J432 Centrilobular emphysema: Secondary | ICD-10-CM | POA: Diagnosis not present

## 2024-01-22 DIAGNOSIS — R232 Flushing: Secondary | ICD-10-CM

## 2024-01-22 DIAGNOSIS — E785 Hyperlipidemia, unspecified: Secondary | ICD-10-CM | POA: Diagnosis not present

## 2024-01-22 DIAGNOSIS — Z6841 Body Mass Index (BMI) 40.0 and over, adult: Secondary | ICD-10-CM

## 2024-01-22 MED ORDER — NYSTATIN 100000 UNIT/ML MT SUSP: 5.0000 mL | Freq: Four times a day (QID) | OROMUCOSAL | 1 refills | Status: AC

## 2024-01-22 MED ORDER — METFORMIN HCL 500 MG PO TABS: 1000.0000 mg | ORAL_TABLET | Freq: Two times a day (BID) | ORAL | 2 refills | Status: DC

## 2024-01-22 MED ORDER — GABAPENTIN 100 MG PO CAPS: 100.0000 mg | ORAL_CAPSULE | Freq: Three times a day (TID) | ORAL | 2 refills | Status: DC

## 2024-01-22 MED ORDER — NYSTATIN 100000 UNIT/ML MT SUSP: 5.0000 mL | Freq: Four times a day (QID) | OROMUCOSAL | 1 refills | Status: DC

## 2024-01-22 NOTE — Progress Notes (Signed)
 SUBJECTIVE:   Chief Complaint  Patient presents with   Medical Management of Chronic Issues    6 mth f/u   HPI Presents for follow up chronic disease management  Discussed the use of AI scribe software for clinical note transcription with the patient, who gave verbal consent to proceed.  History of Present Illness Martha Woodard is a 58 year old female with COPD, asthma, and emphysema who presents for medication refills and management of chronic conditions.  She uses a nebulizer twice a day and Bevespi , two puffs twice a day, along with an emergency inhaler, Flovent . She experiences occasional irritation but manages it with her current regimen. She is on oxygen  therapy, using one liter when leaving the house and two liters at night. Her oxygen  saturation is typically around 93%.  She has a history of diabetes and is currently taking metformin , two tablets twice a day. She recently received cortisone injections for osteoarthritis, which temporarily increased her blood sugar to 169 mg/dL, but it has since stabilized at 125 mg/dL. She also takes gabapentin , 100 mg twice a day, sometimes three times a day depending on her activities.  She has a history of congestive heart failure and respiratory failure, having been hospitalized in 2016 for these conditions. At that time, she lost 20 pounds of fluid and was discharged on 24-hour oxygen  therapy, which has since been reduced. She quit smoking over ten years ago after smoking for 30 years.  She experiences recurrent oral thrush, for which she uses nystatin  oral rinse. She prefers the bubble gum flavor over the mint flavor, which she finds unpleasant.  Her past medical history includes an irregular heartbeat, and she has been under the care of a pulmonologist and a cardiologist. She has an upcoming endocrinology appointment in May.    PERTINENT PMH / PSH: As above  OBJECTIVE:  BP 130/82 (Cuff Size: Large)   Pulse 82   Temp (!) 97.5 F  (36.4 C) (Oral)   Ht 5\' 6"  (1.676 m)   Wt 248 lb (112.5 kg)   LMP 04/10/2014   SpO2 94%   BMI 40.03 kg/m    Physical Exam Vitals reviewed.  Constitutional:      General: She is not in acute distress.    Appearance: She is obese. She is not ill-appearing.  HENT:     Head: Normocephalic.     Nose: Nose normal.     Mouth/Throat:     Mouth: Mucous membranes are moist.  Eyes:     Extraocular Movements: Extraocular movements intact.     Conjunctiva/sclera: Conjunctivae normal.     Pupils: Pupils are equal, round, and reactive to light.  Neck:     Vascular: No carotid bruit.  Cardiovascular:     Rate and Rhythm: Normal rate and regular rhythm.     Pulses: Normal pulses.     Heart sounds: Normal heart sounds.  Pulmonary:     Effort: Pulmonary effort is normal.     Breath sounds: Normal breath sounds. No wheezing.  Abdominal:     General: Bowel sounds are normal. There is no distension.     Palpations: Abdomen is soft.     Tenderness: There is no abdominal tenderness. There is no right CVA tenderness, left CVA tenderness, guarding or rebound.  Musculoskeletal:        General: Normal range of motion.     Cervical back: Normal range of motion.     Right lower leg: No edema.  Left lower leg: No edema.  Lymphadenopathy:     Cervical: No cervical adenopathy.  Skin:    Capillary Refill: Capillary refill takes less than 2 seconds.  Neurological:     General: No focal deficit present.     Mental Status: She is alert and oriented to person, place, and time. Mental status is at baseline.     Motor: No weakness.  Psychiatric:        Mood and Affect: Mood normal.        Behavior: Behavior normal.        Thought Content: Thought content normal.        Judgment: Judgment normal.           01/22/2024   10:16 AM 10/26/2023   12:04 PM 08/17/2023    2:36 PM 07/21/2023   10:48 AM 04/14/2023   10:47 AM  Depression screen PHQ 2/9  Decreased Interest 2 2 0 0 0  Down, Depressed,  Hopeless 0 0 0 0 0  PHQ - 2 Score 2 2 0 0 0  Altered sleeping 2 2 2 2  0  Tired, decreased energy 2 3 2 2  0  Change in appetite 0 0 0 1 0  Feeling bad or failure about yourself  0 0 0 0 0  Trouble concentrating 0 1 0 1 0  Moving slowly or fidgety/restless 0 0 0 0 0  Suicidal thoughts 0 0 0 0 0  PHQ-9 Score 6 8 4 6  0  Difficult doing work/chores Not difficult at all Not difficult at all Not difficult at all Not difficult at all Not difficult at all      01/22/2024   10:17 AM 10/26/2023   12:04 PM 07/21/2023   10:48 AM 04/14/2023   10:48 AM  GAD 7 : Generalized Anxiety Score  Nervous, Anxious, on Edge 0 0 0 0  Control/stop worrying 0 0 0 0  Worry too much - different things 0 0 0 0  Trouble relaxing 0 2 0 0  Restless 0 0 0 0  Easily annoyed or irritable 0 0 0 0  Afraid - awful might happen 0 0 0 0  Total GAD 7 Score 0 2 0 0  Anxiety Difficulty Not difficult at all Not difficult at all Not difficult at all Not difficult at all    ASSESSMENT/PLAN:  Type 2 diabetes mellitus with complications (HCC) Assessment & Plan: Blood sugar elevated to 169 mg/dL post-cortisone, now stabilized at 125 mg/dL. On metformin  1 gm twice daily. - Refill metformin   - Follow up with endocrinology.  Referral previously placed by former PCP for abnormal Dexamethasone  testing   Orders: -     metFORMIN  HCl; Take 2 tablets (1,000 mg total) by mouth 2 (two) times daily with a meal.  Dispense: 400 tablet; Refill: 2  Chronic diastolic heart failure (HCC) Assessment & Plan: -Continue Lasix  and Metoprolol  - Follow up with cardiologist as scheduled   Morbid obesity with BMI of 45.0-49.9, adult (HCC) Assessment & Plan: Elevated BMI with multiple comorbidities Did not tolerate Ozempic  Encourage healthy diet and activity as tolerated   Centrilobular emphysema (HCC) Assessment & Plan: COPD with emphysema requiring oxygen  therapy. Oxygen  saturation stable at 93%. - Continue current oxygen  therapy regimen.  2L n/c continuous - Continue current inhalers - Follow up with pulmonologist on 6/19.   Hot flashes Assessment & Plan: - Refill Gabapentin   Orders: -     Gabapentin ; Take 1 capsule (100 mg total) by mouth 3 (three)  times daily.  Dispense: 300 capsule; Refill: 2  Thrush Assessment & Plan: History of thrush secondary to inhaler use Discussed importance of rinsing after each use Refill nystatin    Orders: -     Nystatin ; Take 5 mLs (500,000 Units total) by mouth 4 (four) times daily.  Dispense: 473 mL; Refill: 1  Hypertension associated with diabetes (HCC) Assessment & Plan: Well controlled Continue Losartan , Metoprolol , hydrochlorothiazide     Hyperlipidemia associated with type 2 diabetes mellitus (HCC) Assessment & Plan: Continue Crestor          PDMP reviewed  Return if symptoms worsen or fail to improve, for PCP.  Valli Gaw, MD

## 2024-01-22 NOTE — Patient Instructions (Signed)
 It was a pleasure meeting you today. Thank you for allowing me to take part in your health care.  Our goals for today as we discussed include:  Refills sent for requested medications  Follow up with Endocrinology as scheduled Follow up with Cardiology as scheduled Follow up with Pulmonology scheduled    This is a list of the screening recommended for you and due dates:  Health Maintenance  Topic Date Due   DTaP/Tdap/Td vaccine (1 - Tdap) Never done   Zoster (Shingles) Vaccine (1 of 2) Never done   COVID-19 Vaccine (3 - Moderna risk series) 12/03/2020   Eye exam for diabetics  02/05/2023   Screening for Lung Cancer  07/17/2023   Complete foot exam   01/12/2024   Yearly kidney health urinalysis for diabetes  04/13/2024   Hemoglobin A1C  04/24/2024   Flu Shot  04/29/2024   Medicare Annual Wellness Visit  08/16/2024   Yearly kidney function blood test for diabetes  10/25/2024   Mammogram  05/03/2025   Colon Cancer Screening  12/31/2025   Pap with HPV screening  02/05/2026   Pneumococcal Vaccination  Completed   Hepatitis C Screening  Completed   HIV Screening  Completed   HPV Vaccine  Aged Out   Meningitis B Vaccine  Aged Out      If you have any questions or concerns, please do not hesitate to call the office at [phone].  I look forward to our next visit and until then take care and stay safe.  Regards,   Valli Gaw, MD   [facility]

## 2024-01-28 DIAGNOSIS — H1045 Other chronic allergic conjunctivitis: Secondary | ICD-10-CM | POA: Diagnosis not present

## 2024-01-28 DIAGNOSIS — H02831 Dermatochalasis of right upper eyelid: Secondary | ICD-10-CM | POA: Diagnosis not present

## 2024-01-28 DIAGNOSIS — H5203 Hypermetropia, bilateral: Secondary | ICD-10-CM | POA: Diagnosis not present

## 2024-01-28 DIAGNOSIS — H02834 Dermatochalasis of left upper eyelid: Secondary | ICD-10-CM | POA: Diagnosis not present

## 2024-01-28 DIAGNOSIS — E119 Type 2 diabetes mellitus without complications: Secondary | ICD-10-CM | POA: Diagnosis not present

## 2024-01-28 DIAGNOSIS — H43813 Vitreous degeneration, bilateral: Secondary | ICD-10-CM | POA: Diagnosis not present

## 2024-01-28 LAB — HM DIABETES EYE EXAM

## 2024-01-31 ENCOUNTER — Encounter: Payer: Self-pay | Admitting: Family Medicine

## 2024-01-31 NOTE — Assessment & Plan Note (Signed)
 Well controlled Continue Losartan , Metoprolol , hydrochlorothiazide 

## 2024-01-31 NOTE — Assessment & Plan Note (Signed)
 COPD with emphysema requiring oxygen  therapy. Oxygen  saturation stable at 93%. - Continue current oxygen  therapy regimen. 2L n/c continuous - Continue current inhalers - Follow up with pulmonologist on 6/19.

## 2024-01-31 NOTE — Assessment & Plan Note (Signed)
-  Continue Lasix  and Metoprolol  - Follow up with cardiologist as scheduled

## 2024-01-31 NOTE — Assessment & Plan Note (Signed)
 Elevated BMI with multiple comorbidities Did not tolerate Ozempic  Encourage healthy diet and activity as tolerated

## 2024-01-31 NOTE — Assessment & Plan Note (Signed)
Refill Gabapentin 

## 2024-01-31 NOTE — Assessment & Plan Note (Signed)
 History of thrush secondary to inhaler use Discussed importance of rinsing after each use Refill nystatin 

## 2024-01-31 NOTE — Assessment & Plan Note (Addendum)
 Blood sugar elevated to 169 mg/dL post-cortisone, now stabilized at 125 mg/dL. On metformin  1 gm twice daily. - Refill metformin   - Follow up with endocrinology.  Referral previously placed by former PCP for abnormal Dexamethasone  testing

## 2024-01-31 NOTE — Assessment & Plan Note (Signed)
 Continue Crestor

## 2024-02-02 DIAGNOSIS — H00021 Hordeolum internum right upper eyelid: Secondary | ICD-10-CM | POA: Diagnosis not present

## 2024-02-08 DIAGNOSIS — M5432 Sciatica, left side: Secondary | ICD-10-CM | POA: Diagnosis not present

## 2024-02-08 DIAGNOSIS — M9905 Segmental and somatic dysfunction of pelvic region: Secondary | ICD-10-CM | POA: Diagnosis not present

## 2024-02-08 DIAGNOSIS — M9904 Segmental and somatic dysfunction of sacral region: Secondary | ICD-10-CM | POA: Diagnosis not present

## 2024-02-08 DIAGNOSIS — M955 Acquired deformity of pelvis: Secondary | ICD-10-CM | POA: Diagnosis not present

## 2024-02-09 DIAGNOSIS — R233 Spontaneous ecchymoses: Secondary | ICD-10-CM | POA: Diagnosis not present

## 2024-02-26 DIAGNOSIS — E249 Cushing's syndrome, unspecified: Secondary | ICD-10-CM | POA: Diagnosis not present

## 2024-02-29 DIAGNOSIS — M9904 Segmental and somatic dysfunction of sacral region: Secondary | ICD-10-CM | POA: Diagnosis not present

## 2024-02-29 DIAGNOSIS — M955 Acquired deformity of pelvis: Secondary | ICD-10-CM | POA: Diagnosis not present

## 2024-02-29 DIAGNOSIS — M9905 Segmental and somatic dysfunction of pelvic region: Secondary | ICD-10-CM | POA: Diagnosis not present

## 2024-02-29 DIAGNOSIS — M5432 Sciatica, left side: Secondary | ICD-10-CM | POA: Diagnosis not present

## 2024-03-02 DIAGNOSIS — E249 Cushing's syndrome, unspecified: Secondary | ICD-10-CM | POA: Diagnosis not present

## 2024-03-08 ENCOUNTER — Telehealth: Payer: Self-pay | Admitting: Pulmonary Disease

## 2024-03-08 NOTE — Telephone Encounter (Signed)
 Patient needs PFT scheduled prior to 03/17/2024.

## 2024-03-09 NOTE — Telephone Encounter (Signed)
 Attempted to call patient to schedule PFT prior to 03/17/2024.

## 2024-03-09 NOTE — Telephone Encounter (Signed)
 Attempted to call patient to schedule PFT prior to 03/17/2024. Please put patient thru office when she calls back.

## 2024-03-15 ENCOUNTER — Other Ambulatory Visit: Payer: Self-pay

## 2024-03-15 DIAGNOSIS — R0602 Shortness of breath: Secondary | ICD-10-CM

## 2024-03-16 ENCOUNTER — Ambulatory Visit: Admitting: Pulmonary Disease

## 2024-03-16 DIAGNOSIS — R0602 Shortness of breath: Secondary | ICD-10-CM

## 2024-03-16 DIAGNOSIS — J4489 Other specified chronic obstructive pulmonary disease: Secondary | ICD-10-CM

## 2024-03-16 DIAGNOSIS — J449 Chronic obstructive pulmonary disease, unspecified: Secondary | ICD-10-CM

## 2024-03-16 LAB — PULMONARY FUNCTION TEST
DL/VA % pred: 148 %
DL/VA: 6.16 ml/min/mmHg/L
DLCO unc % pred: 105 %
DLCO unc: 22.75 ml/min/mmHg
FEF 25-75 Post: 1.08 L/s
FEF 25-75 Pre: 0.55 L/s
FEF2575-%Change-Post: 96 %
FEF2575-%Pred-Post: 42 %
FEF2575-%Pred-Pre: 21 %
FEV1-%Change-Post: 10 %
FEV1-%Pred-Post: 54 %
FEV1-%Pred-Pre: 49 %
FEV1-Post: 1.51 L
FEV1-Pre: 1.37 L
FEV1FVC-%Change-Post: 1 %
FEV1FVC-%Pred-Pre: 82 %
FEV6-%Change-Post: 8 %
FEV6-%Pred-Post: 66 %
FEV6-%Pred-Pre: 61 %
FEV6-Post: 2.3 L
FEV6-Pre: 2.12 L
FEV6FVC-%Pred-Post: 103 %
FEV6FVC-%Pred-Pre: 103 %
FVC-%Change-Post: 8 %
FVC-%Pred-Post: 64 %
FVC-%Pred-Pre: 59 %
FVC-Post: 2.3 L
FVC-Pre: 2.12 L
Post FEV1/FVC ratio: 66 %
Post FEV6/FVC ratio: 100 %
Pre FEV1/FVC ratio: 65 %
Pre FEV6/FVC Ratio: 100 %
RV % pred: 173 %
RV: 3.58 L
TLC % pred: 108 %
TLC: 5.8 L

## 2024-03-16 NOTE — Progress Notes (Signed)
 Full PFT completed today ? ?

## 2024-03-16 NOTE — Patient Instructions (Signed)
 Full PFT completed today ? ?

## 2024-03-17 ENCOUNTER — Ambulatory Visit: Admitting: Pulmonary Disease

## 2024-03-17 ENCOUNTER — Encounter: Payer: Self-pay | Admitting: Pulmonary Disease

## 2024-03-17 VITALS — BP 128/74 | HR 71 | Temp 98.8°F | Ht 66.0 in

## 2024-03-17 DIAGNOSIS — J9611 Chronic respiratory failure with hypoxia: Secondary | ICD-10-CM

## 2024-03-17 DIAGNOSIS — J9612 Chronic respiratory failure with hypercapnia: Secondary | ICD-10-CM | POA: Diagnosis not present

## 2024-03-17 DIAGNOSIS — E662 Morbid (severe) obesity with alveolar hypoventilation: Secondary | ICD-10-CM | POA: Diagnosis not present

## 2024-03-17 DIAGNOSIS — Z87891 Personal history of nicotine dependence: Secondary | ICD-10-CM

## 2024-03-17 DIAGNOSIS — J449 Chronic obstructive pulmonary disease, unspecified: Secondary | ICD-10-CM

## 2024-03-17 DIAGNOSIS — J4489 Other specified chronic obstructive pulmonary disease: Secondary | ICD-10-CM | POA: Diagnosis not present

## 2024-03-17 NOTE — Patient Instructions (Signed)
 VISIT SUMMARY:  Today, you came in for a follow-up visit to check on your COPD and chronic respiratory failure. We discussed your current treatment plan, recent symptoms, and test results.  YOUR PLAN:  -COPD WITH CHRONIC RESPIRATORY FAILURE: COPD with chronic respiratory failure means that your lungs have difficulty moving air in and out, and your body struggles to get enough oxygen . Your lung function has improved from 49% to 54% since 2021. Continue using Bevespi , two puffs twice daily, and Pulmicort  via nebulizer. We will follow up in four months to monitor your progress.  -EDEMA DUE TO FLUID RETENTION: Edema is swelling caused by excess fluid trapped in your body's tissues. Your swelling is likely due to fluid retention made worse by the heat. You are managing this with diuretics prescribed by your cardiologist. Please monitor for any changes in your swelling, especially with the upcoming hot weather.  INSTRUCTIONS:  Please continue with your current medications: Bevespi , two puffs twice daily, and Pulmicort  via nebulizer. Monitor your swelling and keep your feet elevated at night. We will see you again in four months for a follow-up visit.

## 2024-03-17 NOTE — Progress Notes (Signed)
 Subjective:    Patient ID: Martha Woodard, female    DOB: [birth year], 58 y.o.   MRN: [id number]  Patient Care Team: Perla Evalene PARAS, MD as Consulting Physician (Cardiology) Tamea Dedra CROME, MD as Consulting Physician (Pulmonary Disease) Mevelyn Charleston, OD as Referring Physician [facility])  Chief Complaint  Patient presents with   Follow-up    Cough, shortness of breath on exertion.     BACKGROUND/INTERVAL:Martha Woodard is a 58 year old former smoker who presents today for follow-up asthma/COPD overlap syndrome. She is obtained on Bevespi  and Pulmicort  neb solution.  She was last seen on 10 December 2023.  No exacerbations since then.  She has noted increased shortness of breath gradually.  She had PFTs performed 16 March 2024.  PROBLEMS: Chronic hypoxic/hypercarbic respiratory failure Former smoker Moderate/severe COPD [facility] CHF Multiple pulmonary nodules    HPI Discussed the use of AI scribe software for clinical note transcription with the patient, who gave verbal consent to proceed.  History of Present Illness   Martha Woodard is a 58 year old female with COPD and chronic respiratory failure who presents for follow-up.  Her lung function has shown slight improvement since 2021, with an increase in response to treatment from 49% to 54%. She is currently using Bevespi  and budesonide  via nebulizer but is unable to afford Ohtuvayre .  She has experienced peripheral edema over the past two days, which she attributes to the heat. She manages this by propping her feet up at night and is on two diuretics prescribed by her cardiologist.  She recently visited an endocrinologist after a three-month wait and underwent a 24-hour urine test, which showed normal levels. Her weight has decreased to 245 pounds.   Discussed PFTs with the patient.    DATA: 09/28/2015 2D echo: EF 60 to 65%, grade 1 DD, mild left atrial dilation.  Right-sided pressures normal. 02/28/2020 PFTs:FEV1 1.42L or 49%, FVC  or 2.24L 60%, FEV1/FVC 63%, no bronchodilator response.  RV is 122% indicating air trapping.  Diffusion capacity normal.  Consistent with moderate to severe COPD. 04/05/2020 chest x-ray: No active disease.  Consistent with COPD, left base scarring/atelectasis. 01/21/2022 LDCT: Mild to moderate centrilobular emphysema.  Multiple bilateral pulmonary nodules of which the larger were present on 01/17/2016 exam.  Some new nodules from 2017 exam.  Coronary artery calcifications.  Lung RADS 3 short-term follow-up 6 months ordered 07/16/2022 LDCT chest: Mild paraseptal and centrilobular emphysema use bronchial wall thickening.  Numerous scattered solid pulmonary nodules, solitary new basilar right upper lobe pulmonary nodule measuring 4.8 mm.  Lung RADS 3 probably benign finding short-term follow-up in 6 months recommended 09/15/2022 chest x-ray PA and lateral: Hyperinflation of the lungs, flattening of the diaphragms consistent with COPD, bronchial cuffing and increased interstitial markings consistent with bronchitis.  Increased focal opacity in right base could represent atelectasis or early developing pneumonia, chronic opacity in the left base consistent with scar atelectasis. 12/04/2022 chest x-ray PA and lateral: COPD with basilar scarring, no acute findings. 03/16/2024 PFTs: FEV1 1.37 L or 49% predicted, FVC 2.12 L or 59% predicted, FEV1/FVC 65%.  There is a modest response to bronchodilators.  Lung volumes are normal with mild hyperinflation and air trapping noted.  Diffusion capacity normal to supranormal.  Overall no significant decline from prior   Review of Systems A 10 point review of systems was performed and it is as noted above otherwise negative.   Patient Active Problem List   Diagnosis Date Noted   Sore in nose 10/26/2023  Constipation 10/26/2023   Muscle cramps 07/21/2023   Acute recurrent sinusitis 06/03/2023   Lung nodule 04/14/2023   Fatigue 01/12/2023   Thrush 01/12/2023    Lipoma 04/07/2022   Hot flashes 05/24/2021   Papilloma of oral cavity 12/13/2018   Light headedness 06/17/2018   Allergic rhinitis 11/09/2017   Venous insufficiency 08/07/2017   Chronic respiratory failure (HCC) 11/13/2016   Thyroid  nodule 11/28/2015   COPD (chronic obstructive pulmonary disease) with emphysema (HCC) 11/13/2015   Chronic diastolic heart failure (HCC) 11/13/2015   Morbid obesity with BMI of 45.0-49.9, adult (HCC) 10/01/2015   Obesity hypoventilation syndrome (HCC) 10/01/2015   Hypertension associated with diabetes (HCC) 10/01/2015   Type 2 diabetes mellitus with complications (HCC) 06/26/2014   Hyperlipidemia associated with type 2 diabetes mellitus (HCC) 06/26/2014    Social History   Tobacco Use   Smoking status: Former    Current packs/day: 0.00    Average packs/day: 3.0 packs/day for 34.0 years (102.0 ttl pk-yrs)    Types: Cigarettes    Start date: 07/11/1979    Quit date: 07/10/2013    Years since quitting: 10.6   Smokeless tobacco: Never  Substance Use Topics   Alcohol use: No    Alcohol/week: 0.0 standard drinks of alcohol    Allergies  Allergen Reactions   Amoxicillin -Pot Clavulanate Itching and Hives    Hives  Can take PCN and Amoxicillin  alone    Current Meds  Medication Sig   ACCU-CHEK AVIVA PLUS test strip TEST UP TO 4 TIMES DAILY AS DIRECTED   Accu-Chek Softclix Lancets lancets TEST UP TO 4 TIMES DAILY   acetaminophen  (TYLENOL ) 650 MG CR tablet Take 650 mg by mouth every 8 (eight) hours as needed for pain.   albuterol  (PROVENTIL ) (2.5 MG/3ML) 0.083% nebulizer solution Take 3 mLs (2.5 mg total) by nebulization every 6 (six) hours as needed for wheezing or shortness of breath.   albuterol  (VENTOLIN  HFA) 108 (90 Base) MCG/ACT inhaler Inhale 2 puffs into the lungs every 6 (six) hours as needed for wheezing or shortness of breath.   aspirin  EC 81 MG tablet Take 1 tablet (81 mg total) by mouth daily.   azelastine  (ASTELIN ) 0.1 % nasal spray  Place 2 sprays into both nostrils 2 (two) times daily. Use in each nostril as directed   blood glucose meter kit and supplies Dispense based on patient and insurance preference. Use up to four times daily as directed. (FOR ICD-9 250.00, 250.01).   budesonide  (PULMICORT ) 0.5 MG/2ML nebulizer solution Use 1 vial twice a daily as directed. (Patient taking differently: Take 0.5 mg by nebulization 2 (two) times daily. Use 1 vial twice a daily as directed.)   calcium -vitamin D  (OSCAL WITH D) 500-200 MG-UNIT tablet Take 1 tablet by mouth 2 (two) times daily.   cetirizine (ZYRTEC) 10 MG tablet Take 10 mg by mouth daily.   fluticasone  (FLONASE ) 50 MCG/ACT nasal spray USE 1 OR 2 SPRAYS IN EACH NOSTRIL ONCE DAILY.   furosemide  (LASIX ) 20 MG tablet TAKE 1 TABLET BY MOUTH DAILY   gabapentin  (NEURONTIN ) 100 MG capsule Take 1 capsule (100 mg total) by mouth 3 (three) times daily.   Glycopyrrolate-Formoterol  (BEVESPI  AEROSPHERE) 9-4.8 MCG/ACT AERO Inhale 2 puffs into the lungs 2 (two) times daily.   hydrochlorothiazide  (MICROZIDE ) 12.5 MG capsule TAKE 1 CAPSULE BY MOUTH DAILY   losartan  (COZAAR ) 25 MG tablet TAKE 1 TABLET BY MOUTH TWICE  DAILY   magnesium oxide (MAG-OX) 400 MG tablet Take 400 mg by mouth daily.  metFORMIN  (GLUCOPHAGE ) 500 MG tablet Take 2 tablets (1,000 mg total) by mouth 2 (two) times daily with a meal.   metoprolol  succinate (TOPROL -XL) 25 MG 24 hr tablet TAKE 1 TABLET BY MOUTH DAILY   montelukast  (SINGULAIR ) 10 MG tablet TAKE 1 TABLET BY MOUTH AT  BEDTIME   Multiple Vitamin (MULTIVITAMIN) tablet Take 1 tablet by mouth daily.   nystatin  (MYCOSTATIN ) 100000 UNIT/ML suspension Take 5 mLs (500,000 Units total) by mouth 4 (four) times daily.   ondansetron  (ZOFRAN ) 4 MG tablet Take 1 tablet (4 mg total) by mouth every 8 (eight) hours as needed for nausea or vomiting.   potassium chloride  (KLOR-CON ) 10 MEQ tablet TAKE 1 TABLET BY MOUTH TWICE  DAILY   PROVENTIL  HFA 108 (90 Base) MCG/ACT inhaler  Inhale 2 puffs into the lungs every 6 (six) hours as needed for wheezing or shortness of breath. 18 gram inhaler   rosuvastatin  (CRESTOR ) 20 MG tablet TAKE 1 TABLET BY MOUTH DAILY   sodium chloride  (OCEAN) 0.65 % SOLN nasal spray Place 2 sprays into both nostrils as needed for congestion.   vitamin C (ASCORBIC ACID) 500 MG tablet Take 1,000 mg by mouth daily.    Vitamin E 180 MG CAPS Take 180 mg by mouth daily.    Immunization History  Administered Date(s) Administered   Influenza, Seasonal, Injecte, Preservative Fre 07/21/2023   Influenza,inj,Quad PF,6+ Mos 07/02/2015, 08/19/2016, 08/07/2017, 06/16/2018, 06/29/2019, 07/31/2020, 07/05/2021, 09/10/2022   Moderna SARS-COV2 Booster Vaccination 11/05/2020   Moderna Sars-Covid-2 Vaccination 03/08/2020, 04/05/2020   PNEUMOCOCCAL CONJUGATE-20 07/21/2023   Pneumococcal Polysaccharide-23 09/26/2015   Pneumococcal-Unspecified 11/23/2020   Respiratory Syncytial Virus Vaccine ,Recomb Aduvanted(Arexvy ) 08/19/2023        Objective:     BP 128/74 (BP Location: Left Arm, Patient Position: Sitting, Cuff Size: Large)   Pulse 71   Temp 98.8 F (37.1 C) (Oral)   Ht 5' 6 (1.676 m)   LMP 04/10/2014   SpO2 95% Comment: 1L continuous oxygen   BMI 39.90 kg/m   SpO2: 95 % (1L continuous oxygen )  GENERAL: Obese woman, no acute distress, comfortable with nasal cannula O2.  Ambulatory with assistance of cane. HEAD: Normocephalic, atraumatic. EYES: Pupils equal, round, reactive to light.  No scleral icterus. MOUTH: Dentition intact, oral mucosa moist, no thrush. NECK: Supple. No thyromegaly. Trachea midline. No JVD.  No adenopathy. PULMONARY: Mildly diminished breath sounds.  Coarse, otherwise no adventitious sounds.   CARDIOVASCULAR: S1 and S2. Regular rate and rhythm.  No rubs, murmurs or gallops heard. ABDOMEN: Obese, otherwise benign. MUSCULOSKELETAL: No joint deformity, no clubbing, no edema. NEUROLOGIC: No focal deficit, no gait disturbance,  speech is fluent. SKIN: Intact,warm,dry. PSYCH: Mood and behavior normal.     Assessment & Plan:     ICD-10-CM   1. Stage 3 severe COPD by GOLD classification (HCC)  J44.9     2. Asthma-COPD overlap syndrome (HCC)  J44.89     3. Chronic respiratory failure with hypoxia and hypercapnia (HCC)  J96.11    J96.12     4. Obesity hypoventilation syndrome (HCC)  E66.2      Discussion:    COPD with chronic respiratory failure COPD with chronic respiratory failure is well compensated. Lung function has improved from 49% to 54% since 2021. She continues on Bevespi  and Pulmicort  via nebulizer. Unable to afford Ohtuvayre . No acute exacerbations. - Continue Bevespi , two puffs twice daily. - Continue Pulmicort  via nebulizer. - Follow up in four months.  Edema due to fluid retention Edema likely due to fluid  retention exacerbated by heat. Managed with diuretics prescribed by cardiologist. No new diuretic prescription needed. - Monitor for changes in edema, especially with upcoming hot weather.     Advised if symptoms do not improve or worsen, to please contact office for sooner follow up or seek emergency care.    I spent 34 minutes of dedicated to the care of this patient on the date of this encounter to include pre-visit review of records, face-to-face time with the patient discussing conditions above, post visit ordering of testing, clinical documentation with the electronic health record, making appropriate referrals as documented, and communicating necessary findings to members of the patients care team.     C. Leita Sanders, MD [facility]    *This note was generated using voice recognition software/Dragon and/or AI transcription program.  Despite best efforts to proofread, errors can occur which can change the meaning. Any transcriptional errors that result from this process are unintentional and may not be fully corrected at the time  of dictation.

## 2024-03-19 ENCOUNTER — Encounter: Payer: Self-pay | Admitting: Pulmonary Disease

## 2024-03-22 ENCOUNTER — Ambulatory Visit: Payer: Self-pay | Admitting: Pulmonary Disease

## 2024-03-24 DIAGNOSIS — M1712 Unilateral primary osteoarthritis, left knee: Secondary | ICD-10-CM | POA: Diagnosis not present

## 2024-03-28 DIAGNOSIS — M9905 Segmental and somatic dysfunction of pelvic region: Secondary | ICD-10-CM | POA: Diagnosis not present

## 2024-03-28 DIAGNOSIS — M5432 Sciatica, left side: Secondary | ICD-10-CM | POA: Diagnosis not present

## 2024-03-28 DIAGNOSIS — M955 Acquired deformity of pelvis: Secondary | ICD-10-CM | POA: Diagnosis not present

## 2024-03-28 DIAGNOSIS — M9904 Segmental and somatic dysfunction of sacral region: Secondary | ICD-10-CM | POA: Diagnosis not present

## 2024-04-05 ENCOUNTER — Telehealth: Payer: Self-pay

## 2024-04-05 DIAGNOSIS — Z1231 Encounter for screening mammogram for malignant neoplasm of breast: Secondary | ICD-10-CM

## 2024-04-05 NOTE — Addendum Note (Signed)
 Addended by: Eliora Nienhuis on: 04/05/2024 04:34 PM   Modules accepted: Orders

## 2024-04-05 NOTE — Telephone Encounter (Signed)
 Sending to doc of dy for approval. Mammo pended

## 2024-04-05 NOTE — Telephone Encounter (Signed)
 Copied from CRM [id number]. Topic: Clinical - Request for Lab/Test Order >> Apr 05, 2024 12:30 PM Ernestene P wrote: Reason for CRM: Pt advise her mammogram is due next month [facility] is requesting an order be put in for pt to receive it, pt advise she was a pt of dr. Hope. Pt can be reached if there's an issue [id number]

## 2024-04-06 NOTE — Telephone Encounter (Signed)
 Former pt of Dr Hope. Order signed for mammogram.

## 2024-04-06 NOTE — Addendum Note (Signed)
 Addended by: GLENDIA ALLENA RAMAN on: 04/06/2024 03:41 AM   Modules accepted: Orders

## 2024-04-16 NOTE — Progress Notes (Unsigned)
 Date:  04/18/2024   ID:  Mikeya, Tomasetti 1965-11-21, MRN [id number]  Patient Location:  [address] [place] [address]   Provider location:   [facility] [place],  office  PCP:  Abbey Bruckner, MD  Cardiologist:  Perla CRIS Nicolas  Chief Complaint  Patient presents with   Follow-up    OD 12 Month f/u no complaints today. Meds reviewed verbally with pt.    History of Present Illness:    Martha Woodard is a 58 y.o. female  past medical history of diabetes,  asthma,  COPD, Quit smoking 2014, smoked for >30 yrs lung nodules  hospital for acute respiratory distress, 20 pound weight gain, hypoxia, diagnosed with COPD exacerbation and acute diastolic CHF  Chronic LE edema>L obesity hypoventilation Ejection fraction 60% in December 2016 She presents for follow-up of her diastolic CHF, coronary calcification on CT scan  Last seen by myself in clinic 5/24 On oxygen  2 liters at night Feels her shortness of breath and COPD is worse in the hot summer weather Denies significant lower extremity edema  Reports that she stopped Ozempic  as she was having severe constipation Feels A1c is stable  Having severe cramping in her legs at nighttime Takes her potassium as well as magnesium supplement Wonders if it could be the Crestor ,   Happened before with a statin  Continues to take HCTZ and Lasix  daily Denies chest pain concerning for angina Not very active at baseline  COPD exacerbation dec 2023, into jan 2024 Flu positive A, ABX, prednisone   Lab work reviewed A1C 6.3,  CMP normal Total chol 113, LDL 29  Chest CT January 21, 2022, coronary calcification noted Very mild coronary calcification Very mild aortic atherosclerosis   stopped smoking 06/2013  EKG personally reviewed by myself on todays visit EKG Interpretation Date/Time:  Monday April 18 2024 13:23:12 EDT Ventricular Rate:  72 PR Interval:  186 QRS Duration:  84 QT  Interval:  386 QTC Calculation: 422 R Axis:   -1  Text Interpretation: Normal sinus rhythm Low voltage QRS When compared with ECG of 15-Sep-2022 10:34, Questionable change in QRS duration Confirmed by Perla Lye [phone]) on 04/18/2024 1:42:14 PM    Other past medical hx COVID-19 + December 2020 Sore throat, coughing, some shortness of breath, had to use oxygen  at times. Felt like bronchiits Sinus infection has persisted No fever currently PMD called prednisone , may need abx  Prior episode of hypoxic respiratory distress Previous CT chest  Bilateral lower lobe atelectasis, pulmonary edema versus interstitial pneumonitis superimposed on changes of COPD and right lung nodules    initiated on steroids, antibiotics, inhalation therapy as well as diuretics.     hypoxic intermittently, especially at nighttime and sitting in the bed, concerning for obesity hypoventilation syndrome.   Past Medical History:  Diagnosis Date   CHF (congestive heart failure) (HCC)    Colon polyp 01-01-2016   COPD (chronic obstructive pulmonary disease) (HCC)    Diabetes mellitus without complication (HCC)    Diastolic heart failure (HCC)    HLD (hyperlipidemia)    Hypertension    Morbid obesity with BMI of 45.0-49.9, adult (HCC)    Multiple lung nodules on CT    Shortness of breath dyspnea    Past Surgical History:  Procedure Laterality Date   BIOPSY THYROID  Right 02-06-14   NEGATIVE FOR MALIGNANT CELLS proteinaceous material and macrophages.   BREAST BIOPSY Left    neg- core   COLONOSCOPY  WITH PROPOFOL  N/A 01/01/2016   Procedure: COLONOSCOPY WITH PROPOFOL ;  Surgeon: Rogelia Copping, MD;  Location: [facility] ENDOSCOPY;  Service: Endoscopy;  Laterality: N/A;   thryoid fna Right April 2015   Proteinaceous material and macrophages.   WISDOM TOOTH EXTRACTION      Allergies:   Amoxicillin -pot clavulanate   Social History   Tobacco Use   Smoking status: Former    Current packs/day: 0.00    Average packs/day:  3.0 packs/day for 34.0 years (102.0 ttl pk-yrs)    Types: Cigarettes    Start date: 07/11/1979    Quit date: 07/10/2013    Years since quitting: 10.7   Smokeless tobacco: Never  Vaping Use   Vaping status: Former  Substance Use Topics   Alcohol use: No    Alcohol/week: 0.0 standard drinks of alcohol   Drug use: No     Current Outpatient Medications on File Prior to Visit  Medication Sig Dispense Refill   ACCU-CHEK AVIVA PLUS test strip TEST UP TO 4 TIMES DAILY AS DIRECTED 400 strip 3   Accu-Chek Softclix Lancets lancets TEST UP TO 4 TIMES DAILY 400 each 3   acetaminophen  (TYLENOL ) 650 MG CR tablet Take 650 mg by mouth every 8 (eight) hours as needed for pain.     albuterol  (PROVENTIL ) (2.5 MG/3ML) 0.083% nebulizer solution Take 3 mLs (2.5 mg total) by nebulization every 6 (six) hours as needed for wheezing or shortness of breath. 125 mL 12   albuterol  (VENTOLIN  HFA) 108 (90 Base) MCG/ACT inhaler Inhale 2 puffs into the lungs every 6 (six) hours as needed for wheezing or shortness of breath. 54 g 2   aspirin  EC 81 MG tablet Take 1 tablet (81 mg total) by mouth daily. 90 tablet 3   azelastine  (ASTELIN ) 0.1 % nasal spray Place 2 sprays into both nostrils 2 (two) times daily. Use in each nostril as directed 30 mL 12   blood glucose meter kit and supplies Dispense based on patient and insurance preference. Use up to four times daily as directed. (FOR ICD-9 250.00, 250.01). 1 each 0   budesonide  (PULMICORT ) 0.5 MG/2ML nebulizer solution Use 1 vial twice a daily as directed. (Patient taking differently: Take 0.5 mg by nebulization 2 (two) times daily. Use 1 vial twice a daily as directed.) 60 mL 11   calcium -vitamin D  (OSCAL WITH D) 500-200 MG-UNIT tablet Take 1 tablet by mouth 2 (two) times daily.     cetirizine (ZYRTEC) 10 MG tablet Take 10 mg by mouth daily.     fluticasone  (FLONASE ) 50 MCG/ACT nasal spray USE 1 OR 2 SPRAYS IN EACH NOSTRIL ONCE DAILY. 16 g 3   furosemide  (LASIX ) 20 MG tablet  TAKE 1 TABLET BY MOUTH DAILY 100 tablet 2   gabapentin  (NEURONTIN ) 100 MG capsule Take 1 capsule (100 mg total) by mouth 3 (three) times daily. 300 capsule 2   Glycopyrrolate-Formoterol  (BEVESPI  AEROSPHERE) 9-4.8 MCG/ACT AERO Inhale 2 puffs into the lungs 2 (two) times daily.     hydrochlorothiazide  (MICROZIDE ) 12.5 MG capsule TAKE 1 CAPSULE BY MOUTH DAILY 100 capsule 2   losartan  (COZAAR ) 25 MG tablet TAKE 1 TABLET BY MOUTH TWICE  DAILY 200 tablet 2   magnesium oxide (MAG-OX) 400 MG tablet Take 400 mg by mouth daily.     metFORMIN  (GLUCOPHAGE ) 500 MG tablet Take 2 tablets (1,000 mg total) by mouth 2 (two) times daily with a meal. 400 tablet 2   metoprolol  succinate (TOPROL -XL) 25 MG 24 hr tablet TAKE 1  TABLET BY MOUTH DAILY 100 tablet 2   montelukast  (SINGULAIR ) 10 MG tablet TAKE 1 TABLET BY MOUTH AT  BEDTIME 100 tablet 2   Multiple Vitamin (MULTIVITAMIN) tablet Take 1 tablet by mouth daily.     nystatin  (MYCOSTATIN ) 100000 UNIT/ML suspension Take 5 mLs (500,000 Units total) by mouth 4 (four) times daily. 473 mL 1   ondansetron  (ZOFRAN ) 4 MG tablet Take 1 tablet (4 mg total) by mouth every 8 (eight) hours as needed for nausea or vomiting. 20 tablet 0   potassium chloride  (KLOR-CON ) 10 MEQ tablet TAKE 1 TABLET BY MOUTH TWICE  DAILY 200 tablet 2   PROVENTIL  HFA 108 (90 Base) MCG/ACT inhaler Inhale 2 puffs into the lungs every 6 (six) hours as needed for wheezing or shortness of breath. 18 gram inhaler 3 each 1   rosuvastatin  (CRESTOR ) 20 MG tablet TAKE 1 TABLET BY MOUTH DAILY 100 tablet 2   sodium chloride  (OCEAN) 0.65 % SOLN nasal spray Place 2 sprays into both nostrils as needed for congestion. 30 mL 11   vitamin C (ASCORBIC ACID) 500 MG tablet Take 1,000 mg by mouth daily.      Vitamin E 180 MG CAPS Take 180 mg by mouth daily.     Ensifentrine  (OHTUVAYRE ) 3 MG/2.5ML SUSP Take 3 mg by nebulization 2 (two) times daily. (Patient not taking: Reported on 04/18/2024) 150 mL 11   No current  facility-administered medications on file prior to visit.     Family Hx: The patient's family history includes Breast cancer (age of onset: 41) in her sister; Breast cancer (age of onset: 77) in her sister; Cancer (age of onset: 30) in her mother; Goiter in her mother; Heart murmur in her sister; Stroke in her brother and father.  ROS:   Please see the history of present illness.    Review of Systems  Constitutional: Negative.   HENT: Negative.    Respiratory:  Positive for shortness of breath.   Cardiovascular: Negative.   Gastrointestinal: Negative.   Musculoskeletal: Negative.   Neurological: Negative.   Psychiatric/Behavioral: Negative.    All other systems reviewed and are negative.    Labs/Other Tests and Data Reviewed:    Recent Labs: 10/26/2023: ALT 30; BUN 11; Creatinine, Ser 0.56; Potassium 3.9; Sodium 141   Recent Lipid Panel Lab Results  Component Value Date/Time   CHOL 113 04/14/2023 11:05 AM   TRIG 148.0 04/14/2023 11:05 AM   HDL 53.90 04/14/2023 11:05 AM   CHOLHDL 2 04/14/2023 11:05 AM   LDLCALC 29 04/14/2023 11:05 AM   LDLDIRECT 57.0 07/27/2018 09:20 AM    Wt Readings from Last 3 Encounters:  04/18/24 246 lb 4 oz (111.7 kg)  03/16/24 247 lb 3.2 oz (112.1 kg)  01/22/24 248 lb (112.5 kg)     Exam:    Vital Signs: Vital signs may also be detailed in the HPI BP 120/70 (BP Location: Left Arm, Patient Position: Sitting, Cuff Size: Large)   Pulse 72   Ht 5' 6 (1.676 m)   Wt 246 lb 4 oz (111.7 kg)   LMP 04/10/2014   SpO2 98%   BMI 39.75 kg/m   Constitutional:  oriented to person, place, and time. No distress.  HENT:  Head: Grossly normal Eyes:  no discharge. No scleral icterus.  Neck: No JVD, no carotid bruits  Cardiovascular: Regular rate and rhythm, no murmurs appreciated Pulmonary/Chest: Clear to auscultation bilaterally, no wheezes or rales Abdominal: Soft.  no distension.  no tenderness.  Musculoskeletal: Normal  range of motion Neurological:   normal muscle tone. Coordination normal. No atrophy Skin: Skin warm and dry Psychiatric: normal affect, pleasant   ASSESSMENT & PLAN:    Problem List Items Addressed This Visit       Cardiology Problems   Chronic diastolic heart failure (HCC) - Primary   Relevant Orders   EKG 12-Lead (Completed)     Other   COPD (chronic obstructive pulmonary disease) with emphysema (HCC) (Chronic)   Relevant Orders   EKG 12-Lead (Completed)   Morbid obesity with BMI of 45.0-49.9, adult (HCC)   Relevant Orders   EKG 12-Lead (Completed)   Obesity hypoventilation syndrome (HCC)   Relevant Orders   EKG 12-Lead (Completed)   Other Visit Diagnoses       Type 2 diabetes mellitus with other circulatory complication, without long-term current use of insulin  (HCC)       Relevant Orders   EKG 12-Lead (Completed)     Coronary artery calcification seen on CT scan       Relevant Orders   EKG 12-Lead (Completed)     Mixed hyperlipidemia       Relevant Orders   EKG 12-Lead (Completed)     Essential hypertension       Relevant Orders   EKG 12-Lead (Completed)     PAC (premature atrial contraction)       Relevant Orders   EKG 12-Lead (Completed)     Myalgia due to statin          Coronary calcification Noted on CT chest, mild in nature,  A1c improving, cholesterol at goal,  current non-smoker  hold the Crestor  20 given severe leg cramping overnight If symptoms improve, may need Crestor  10 with Zetia or PCSK9 inhibitor  COPD: Severe symptoms December 2023 into January 2024 Multiple rounds of antibiotic and steroids Stopped smoking 2014 Remains on chronic oxygen  when she ambulates outside the house and at night Followed by pulmonary, feels her symptoms are stable  Obesity hypoventilation: Off Ozempic , weight down several pounds from last year  Diastolic CHF Taking Lasix  with HCTZ, potassium 20 daily  Also takes magnesium  BMP stable  Diabetes:  HBA1C 6.3 stopped drinking soda,  numbers improved   Signed, Martha Lunger, MD  [facility] [address], [place], [place] [address]

## 2024-04-18 ENCOUNTER — Encounter: Payer: Self-pay | Admitting: Cardiovascular Disease

## 2024-04-18 ENCOUNTER — Ambulatory Visit: Attending: Cardiovascular Disease | Admitting: Cardiovascular Disease

## 2024-04-18 VITALS — BP 120/70 | HR 72 | Ht 66.0 in | Wt 246.2 lb

## 2024-04-18 DIAGNOSIS — E782 Mixed hyperlipidemia: Secondary | ICD-10-CM | POA: Diagnosis not present

## 2024-04-18 DIAGNOSIS — I491 Atrial premature depolarization: Secondary | ICD-10-CM

## 2024-04-18 DIAGNOSIS — I5032 Chronic diastolic (congestive) heart failure: Secondary | ICD-10-CM

## 2024-04-18 DIAGNOSIS — T466X5A Adverse effect of antihyperlipidemic and antiarteriosclerotic drugs, initial encounter: Secondary | ICD-10-CM

## 2024-04-18 DIAGNOSIS — I2510 Atherosclerotic heart disease of native coronary artery without angina pectoris: Secondary | ICD-10-CM

## 2024-04-18 DIAGNOSIS — E1159 Type 2 diabetes mellitus with other circulatory complications: Secondary | ICD-10-CM

## 2024-04-18 DIAGNOSIS — I10 Essential (primary) hypertension: Secondary | ICD-10-CM

## 2024-04-18 DIAGNOSIS — J432 Centrilobular emphysema: Secondary | ICD-10-CM | POA: Diagnosis not present

## 2024-04-18 DIAGNOSIS — M791 Myalgia, unspecified site: Secondary | ICD-10-CM

## 2024-04-18 DIAGNOSIS — E662 Morbid (severe) obesity with alveolar hypoventilation: Secondary | ICD-10-CM | POA: Diagnosis not present

## 2024-04-18 DIAGNOSIS — Z6841 Body Mass Index (BMI) 40.0 and over, adult: Secondary | ICD-10-CM

## 2024-04-18 NOTE — Patient Instructions (Addendum)
 Medication Instructions:   Please hold the cholesterol medication, rosuvastatin , for a few weeks If cramps go away, please call the office  If you need a refill on your cardiac medications before your next appointment, please call your pharmacy.   Lab work: No new labs needed  Testing/Procedures: No new testing needed  Follow-Up: At [facility], you and your health needs are our priority.  As part of our continuing mission to provide you with exceptional heart care, we have created designated Provider Care Teams.  These Care Teams include your primary Cardiologist (physician) and Advanced Practice Providers (APPs -  Physician Assistants and Nurse Practitioners) who all work together to provide you with the care you need, when you need it.  You will need a follow up appointment in 12 months  Providers on your designated Care Team:   Lonni Meager, NP Bernardino Bring, PA-C Cadence [place], [place]  COVID-19 Vaccine Information can be found at: [email or web address] For questions related to vaccine distribution or appointments, please email [email or web address] or call [phone].

## 2024-04-22 DIAGNOSIS — M170 Bilateral primary osteoarthritis of knee: Secondary | ICD-10-CM | POA: Diagnosis not present

## 2024-04-26 NOTE — Telephone Encounter (Signed)
 Martha Woodard

## 2024-04-27 ENCOUNTER — Telehealth: Payer: Self-pay | Admitting: Cardiovascular Disease

## 2024-04-27 DIAGNOSIS — Z79899 Other long term (current) drug therapy: Secondary | ICD-10-CM

## 2024-04-27 NOTE — Telephone Encounter (Signed)
 Pt c/o medication issue:  1. Name of Medication: rosuvastatin  (CRESTOR ) 20 MG tablet   2. How are you currently taking this medication (dosage and times per day)? Stopped taking   3. Are you having a reaction (difficulty breathing--STAT)? No   4. What is your medication issue? Per pt has stopped medication per Dr.Gollan to see if her legs have stopped cramping and pt reports her leg cramps have stopped. She wants to know if her is going to put her on another BP Medication.

## 2024-04-27 NOTE — Telephone Encounter (Signed)
 Patient called to report that since discontinuing rosuvastatin , her leg cramps have resolved. She is calling to inform Dr. Gollan and to inquire whether he would like to initiate an alternative medication. The patient stated she does not wish to take another statin, as she believes statins are the cause of her muscle cramps.  Will forward to MD for recommendations.

## 2024-04-28 DIAGNOSIS — H02403 Unspecified ptosis of bilateral eyelids: Secondary | ICD-10-CM | POA: Diagnosis not present

## 2024-05-02 DIAGNOSIS — M9904 Segmental and somatic dysfunction of sacral region: Secondary | ICD-10-CM | POA: Diagnosis not present

## 2024-05-02 DIAGNOSIS — M9905 Segmental and somatic dysfunction of pelvic region: Secondary | ICD-10-CM | POA: Diagnosis not present

## 2024-05-02 DIAGNOSIS — M5432 Sciatica, left side: Secondary | ICD-10-CM | POA: Diagnosis not present

## 2024-05-02 DIAGNOSIS — M955 Acquired deformity of pelvis: Secondary | ICD-10-CM | POA: Diagnosis not present

## 2024-05-02 MED ORDER — EZETIMIBE 10 MG PO TABS: 10.0000 mg | ORAL_TABLET | Freq: Every day | ORAL | 3 refills | Status: AC

## 2024-05-02 MED ORDER — ROSUVASTATIN CALCIUM 5 MG PO TABS: 5.0000 mg | ORAL_TABLET | Freq: Every day | ORAL | 3 refills | Status: AC

## 2024-05-02 NOTE — Telephone Encounter (Signed)
 Called and spoke with patient. Notified her of the following from Dr. Gollan.  Would recommend we retry Crestor  but at 5 mg down from 20 mg Would also add Zetia  10 mg daily If she gets a leg cramp would take 2 to 3 days off from the Crestor  then restart the 5 mg Repeat lipids in 3 months Thx TGollan    Patient verbalizes understanding. Prescriptions sent to preferred pharmacy. Orders placed for labs.

## 2024-05-23 DIAGNOSIS — M5432 Sciatica, left side: Secondary | ICD-10-CM | POA: Diagnosis not present

## 2024-05-23 DIAGNOSIS — M9904 Segmental and somatic dysfunction of sacral region: Secondary | ICD-10-CM | POA: Diagnosis not present

## 2024-05-23 DIAGNOSIS — M9905 Segmental and somatic dysfunction of pelvic region: Secondary | ICD-10-CM | POA: Diagnosis not present

## 2024-05-23 DIAGNOSIS — M955 Acquired deformity of pelvis: Secondary | ICD-10-CM | POA: Diagnosis not present

## 2024-06-02 DIAGNOSIS — H57813 Brow ptosis, bilateral: Secondary | ICD-10-CM | POA: Diagnosis not present

## 2024-06-02 DIAGNOSIS — H02831 Dermatochalasis of right upper eyelid: Secondary | ICD-10-CM | POA: Diagnosis not present

## 2024-06-06 ENCOUNTER — Other Ambulatory Visit: Payer: Self-pay

## 2024-06-06 MED ORDER — BEVESPI AEROSPHERE 9-4.8 MCG/ACT IN AERO: 2.0000 | INHALATION_SPRAY | Freq: Two times a day (BID) | RESPIRATORY_TRACT | 11 refills | Status: AC

## 2024-06-16 DIAGNOSIS — H02831 Dermatochalasis of right upper eyelid: Secondary | ICD-10-CM | POA: Diagnosis not present

## 2024-06-16 DIAGNOSIS — H02834 Dermatochalasis of left upper eyelid: Secondary | ICD-10-CM | POA: Diagnosis not present

## 2024-06-16 HISTORY — PX: RECONSTRUCTION OF EYELID: SHX6576

## 2024-06-23 ENCOUNTER — Other Ambulatory Visit: Payer: Self-pay

## 2024-06-23 DIAGNOSIS — J309 Allergic rhinitis, unspecified: Secondary | ICD-10-CM

## 2024-06-23 MED ORDER — MONTELUKAST SODIUM 10 MG PO TABS: 10.0000 mg | ORAL_TABLET | Freq: Every day | ORAL | 0 refills | Status: DC

## 2024-06-24 DIAGNOSIS — Z9889 Other specified postprocedural states: Secondary | ICD-10-CM | POA: Diagnosis not present

## 2024-06-30 ENCOUNTER — Telehealth: Payer: Self-pay

## 2024-06-30 NOTE — Telephone Encounter (Signed)
 Copied from CRM #[id number]. Topic: General - Other >> Jun 30, 2024 11:07 AM Ahlexyia S wrote: Reason for CRM: Pt called in to confirm if Dr. Abbey is in network with [facility]. Contacted [facility] to confirm and was told that she is in network. Informed pt and pt stated that [facility] is telling pt that provider needs to update her credentials with [facility] so the pt isnt receiving any out of network cost. Pt would like to be contacted when this is done.

## 2024-07-06 ENCOUNTER — Telehealth: Payer: Self-pay

## 2024-07-06 ENCOUNTER — Ambulatory Visit (INDEPENDENT_AMBULATORY_CARE_PROVIDER_SITE_OTHER)

## 2024-07-06 ENCOUNTER — Ambulatory Visit: Payer: Self-pay

## 2024-07-06 VITALS — BP 126/70 | HR 91 | Temp 99.5°F | Ht 66.0 in | Wt 242.4 lb

## 2024-07-06 DIAGNOSIS — E1140 Type 2 diabetes mellitus with diabetic neuropathy, unspecified: Secondary | ICD-10-CM

## 2024-07-06 DIAGNOSIS — J9611 Chronic respiratory failure with hypoxia: Secondary | ICD-10-CM | POA: Diagnosis not present

## 2024-07-06 DIAGNOSIS — I152 Hypertension secondary to endocrine disorders: Secondary | ICD-10-CM

## 2024-07-06 DIAGNOSIS — E538 Deficiency of other specified B group vitamins: Secondary | ICD-10-CM | POA: Insufficient documentation

## 2024-07-06 DIAGNOSIS — R232 Flushing: Secondary | ICD-10-CM

## 2024-07-06 DIAGNOSIS — E782 Mixed hyperlipidemia: Secondary | ICD-10-CM

## 2024-07-06 DIAGNOSIS — R5383 Other fatigue: Secondary | ICD-10-CM

## 2024-07-06 DIAGNOSIS — I5032 Chronic diastolic (congestive) heart failure: Secondary | ICD-10-CM

## 2024-07-06 DIAGNOSIS — Z0189 Encounter for other specified special examinations: Secondary | ICD-10-CM

## 2024-07-06 DIAGNOSIS — B370 Candidal stomatitis: Secondary | ICD-10-CM | POA: Diagnosis not present

## 2024-07-06 DIAGNOSIS — R110 Nausea: Secondary | ICD-10-CM | POA: Diagnosis not present

## 2024-07-06 DIAGNOSIS — Z1231 Encounter for screening mammogram for malignant neoplasm of breast: Secondary | ICD-10-CM

## 2024-07-06 DIAGNOSIS — Z8639 Personal history of other endocrine, nutritional and metabolic disease: Secondary | ICD-10-CM

## 2024-07-06 DIAGNOSIS — E1159 Type 2 diabetes mellitus with other circulatory complications: Secondary | ICD-10-CM | POA: Diagnosis not present

## 2024-07-06 DIAGNOSIS — E118 Type 2 diabetes mellitus with unspecified complications: Secondary | ICD-10-CM

## 2024-07-06 DIAGNOSIS — J309 Allergic rhinitis, unspecified: Secondary | ICD-10-CM | POA: Diagnosis not present

## 2024-07-06 DIAGNOSIS — Z23 Encounter for immunization: Secondary | ICD-10-CM | POA: Diagnosis not present

## 2024-07-06 DIAGNOSIS — G8929 Other chronic pain: Secondary | ICD-10-CM

## 2024-07-06 DIAGNOSIS — J432 Centrilobular emphysema: Secondary | ICD-10-CM

## 2024-07-06 DIAGNOSIS — R945 Abnormal results of liver function studies: Secondary | ICD-10-CM | POA: Insufficient documentation

## 2024-07-06 LAB — LIPID PANEL
Cholesterol: 105 mg/dL (ref 0–200)
HDL: 46.5 mg/dL (ref >39.00)
LDL Cholesterol: 31 mg/dL (ref 0–99)
NonHDL: 58.08
Total CHOL/HDL Ratio: 2
Triglycerides: 137 mg/dL (ref 0.0–149.0)
VLDL: 27.4 mg/dL (ref 0.0–40.0)

## 2024-07-06 LAB — COMPREHENSIVE METABOLIC PANEL WITH GFR
ALT: 61 U/L — ABNORMAL HIGH (ref 0–35)
AST: 56 U/L — ABNORMAL HIGH (ref 0–37)
Albumin: 4.5 g/dL (ref 3.5–5.2)
Alkaline Phosphatase: 50 U/L (ref 39–117)
BUN: 14 mg/dL (ref 6–23)
CO2: 33 meq/L — ABNORMAL HIGH (ref 19–32)
Calcium: 10.2 mg/dL (ref 8.4–10.5)
Chloride: 100 meq/L (ref 96–112)
Creatinine, Ser: 0.59 mg/dL (ref 0.40–1.20)
GFR: 98.37 mL/min (ref >60.00)
Glucose, Bld: 94 mg/dL (ref 70–99)
Potassium: 4.2 meq/L (ref 3.5–5.1)
Sodium: 144 meq/L (ref 135–145)
Total Bilirubin: 0.4 mg/dL (ref 0.2–1.2)
Total Protein: 6.7 g/dL (ref 6.0–8.3)

## 2024-07-06 LAB — HEMOGLOBIN A1C: Hgb A1c MFr Bld: 6.5 % (ref 4.6–6.5)

## 2024-07-06 LAB — MICROALBUMIN / CREATININE URINE RATIO
Creatinine,U: 31.7 mg/dL
Microalb Creat Ratio: UNDETERMINED mg/g (ref 0.0–30.0)
Microalb, Ur: <0.7 mg/dL

## 2024-07-06 LAB — VITAMIN B12: Vitamin B-12: 717 pg/mL (ref 211–911)

## 2024-07-06 MED ORDER — FLUTICASONE PROPIONATE 50 MCG/ACT NA SUSP: NASAL | 3 refills | Status: AC

## 2024-07-06 MED ORDER — METFORMIN HCL 500 MG PO TABS: 1000.0000 mg | ORAL_TABLET | Freq: Two times a day (BID) | ORAL | 3 refills | Status: AC

## 2024-07-06 MED ORDER — GABAPENTIN 100 MG PO CAPS: 100.0000 mg | ORAL_CAPSULE | Freq: Two times a day (BID) | ORAL | 3 refills | Status: AC

## 2024-07-06 MED ORDER — MONTELUKAST SODIUM 10 MG PO TABS: 10.0000 mg | ORAL_TABLET | Freq: Every day | ORAL | 0 refills | Status: DC

## 2024-07-06 MED ORDER — ACCU-CHEK AVIVA PLUS VI STRP
ORAL_STRIP | 3 refills | Status: AC
Start: 2024-07-06 — End: ?

## 2024-07-06 MED ORDER — ONDANSETRON HCL 4 MG PO TABS: 4.0000 mg | ORAL_TABLET | Freq: Every day | ORAL | 0 refills | Status: DC | PRN

## 2024-07-06 NOTE — Assessment & Plan Note (Signed)
 Osteoarthritis of Bilateral Knees with Chronic Lower Extremity Pain and occasional hip pain.  Chronic knee pain managed with cortisone injections (f/u with [facility]), OTC meds, and Voltaren gel. - Encourage gentle stretching and activity. Consider physical therapy if pain persists.

## 2024-07-06 NOTE — Assessment & Plan Note (Signed)
 Occasional mostly in the mornings that resolves with prn use of zofran  4 mg, refill sent. Recommend patient reevaluated if worsening or new GI symptoms.

## 2024-07-06 NOTE — Assessment & Plan Note (Signed)
 BP within goal today of <130/80 mmHg. Continue current medications: Losartan  25 mg BID, Metoprolol  25 mg daily, hydrochlorothiazide  12.5 mg daily, Lasix  20 mg daily.

## 2024-07-06 NOTE — Assessment & Plan Note (Signed)
 Involving b/l feet. Diabetic foot exam done, normal today. Continue Gabapentin  100 mg, twice a day, refill sent. PDMP reviewed.

## 2024-07-06 NOTE — Assessment & Plan Note (Signed)
 Stable. Continue nasal Flonase  2 puff in each nostril, Zyrtec 10 mg and singular 10 mg daily (refill sent).

## 2024-07-06 NOTE — Assessment & Plan Note (Signed)
 Recurrent with prn use of nystatin . Oral hygiene discussed with the patient.

## 2024-07-06 NOTE — Patient Instructions (Addendum)
-   You are due for tetanus booster (you can update this through your local pharmacy). You also qualify for COVID-19, shingles vaccine I recommend you update this through your local pharmacy.   You can try Diclofenac gel 1% up to 4 times a day on both knees, hip when you have pain.

## 2024-07-06 NOTE — Assessment & Plan Note (Addendum)
 On supplemental oxygen , no chest pain, dyspnea, orthopnea from baseline.  Continue current medication: Losartan  25 mg BID, Metoprolol  25 mg daily, hydrochlorothiazide  12.5 mg daily, Lasix  20 mg daily.

## 2024-07-06 NOTE — Progress Notes (Signed)
 Please let the patient know her lab shows elevation in liver enzymes called AST and ALT. Her bilirubin and rest of the liver function looks normal. I recommend she limit tylenol  to less than 2 gm per day. I recommend repeating liver function in 1 week, if it continues to be elevated we will need to do further evaluation. Future lab ordered.   A1c, cholesterol looks stable. Urine was negative for proteins. B 12 is stable and normalized.   Thank you,  Luke Shade, MD  ------------- 1. Abnormal liver function (Primary) - Hepatic function panel; Future - CBC w/Diff; Future - Gamma GT; Future Luke Shade, MD

## 2024-07-06 NOTE — Assessment & Plan Note (Signed)
 COPD with emphysema requiring oxygen  therapy. Follows up with pulmonology closely. No new concerns. Does get oral candidiasis recurrently. Discussed oral hygiene. Continue f/u with Dr. Tamea.

## 2024-07-06 NOTE — Assessment & Plan Note (Addendum)
 With hyperlipidemia, b/l lower leg neuropathy.  Repeat A1c, urine microalbumin.  Continue Metformin  1000 mg twice a day (500 mg 2 tab).  Continue Rosuvastatin  5 mg.  Continue Losartan  25 mg BID.  Influenza immunization updated.  Recommend updating shingles, tdap, COVID-19 through local pharmacy.

## 2024-07-06 NOTE — Telephone Encounter (Signed)
 Sam from [facility] was notified of Dr Graylon recommendations. Sam verbalized understanding.

## 2024-07-06 NOTE — Progress Notes (Signed)
 Established Patient Office Visit TOC from Dr. Hope    Subjective  Patient ID: Martha Woodard, female    DOB: 06-11-1966  Age: 58 y.o. MRN: [id number]  Chief Complaint  Patient presents with   Establish Care   Hip Pain   Spasms    She  has a past medical history of CHF (congestive heart failure) (HCC), Colon polyp (01-01-2016), COPD (chronic obstructive pulmonary disease) (HCC), Diabetes mellitus without complication (HCC), Diastolic heart failure (HCC), HLD (hyperlipidemia), Hypertension, Morbid obesity with BMI of 45.0-49.9, adult (HCC), Multiple lung nodules on CT, and Shortness of breath dyspnea.   HPI:  Discussed the use of AI scribe software for clinical note transcription with the patient, who gave verbal consent to proceed.  History of Present Illness Martha Woodard is a 58 year old female with diabetes, osteoarthritis, and chronic heart failure who presents for a follow-up visit.  She experiences pain down her left leg, described as muscular rather than bone-related, persisting for a few weeks with recent exacerbation. She has a history of diabetic neuropathy in her legs and feet, managed with gabapentin  100 mg twice a day, which she finds effective.  She underwent eyelid surgery on June 16, 2024, with reported improvement, although she still feels the stitches. She experienced a ruptured vein after choking on food, resulting in bruising that has since improved.  She has osteoarthritis and receives cortisone injections every three months in her knees. She experiences bone-on-bone rubbing in her knees and uses Tylenol  and ibuprofen for pain management. Recently, she has been using arthritis BC for increased pain.  She has a history of diabetes, monitoring her blood sugar every morning with readings generally under 130 mg/dL. Her last A1c was 6.3% in January 2025. She takes metformin  500 mg, two tablets in the morning and two at night.  She has chronic heart failure and  COPD, requiring oxygen  therapy at two liters at night and one liter during the day. She uses an albuterol  inhaler and nebulizer as needed. She reports swelling in her ankles and difficulty wearing compression socks due to pain. She sleeps in a recliner due to discomfort and orthopnea.  She takes several medications for allergies, including Flonase , cetirizine, and montelukast . She also takes Zetia  for cholesterol, hydrochlorothiazide , losartan , and metoprolol  for blood pressure management.  She experiences occasional nausea, for which she uses Zofran  as needed, finding it helpful. She has a history of low B12 levels and takes B12 supplements intermittently. She also takes magnesium daily.    ROS As per HPI    Objective:     BP 126/70 (BP Location: Right Arm, Patient Position: Sitting, Cuff Size: Normal)   Pulse 91   Temp 99.5 F (37.5 C) (Oral)   Ht 5' 6 (1.676 m)   Wt 242 lb 6.4 oz (110 kg)   LMP 04/10/2014   SpO2 93%   BMI 39.12 kg/m      07/06/2024   10:20 AM 01/22/2024   10:16 AM 10/26/2023   12:04 PM  Depression screen PHQ 2/9  Decreased Interest 0 2 2  Down, Depressed, Hopeless 0 0 0  PHQ - 2 Score 0 2 2  Altered sleeping 1 2 2   Tired, decreased energy 3 2 3   Change in appetite 2 0 0  Feeling bad or failure about yourself  0 0 0  Trouble concentrating 1 0 1  Moving slowly or fidgety/restless 2 0 0  Suicidal thoughts 0 0 0  PHQ-9 Score 9 6  8  Difficult doing work/chores Somewhat difficult Not difficult at all Not difficult at all      07/06/2024   10:20 AM 01/22/2024   10:17 AM 10/26/2023   12:04 PM 07/21/2023   10:48 AM  GAD 7 : Generalized Anxiety Score  Nervous, Anxious, on Edge 0 0 0 0  Control/stop worrying 0 0 0 0  Worry too much - different things 0 0 0 0  Trouble relaxing 1 0 2 0  Restless 0 0 0 0  Easily annoyed or irritable 0 0 0 0  Afraid - awful might happen 0 0 0 0  Total GAD 7 Score 1 0 2 0  Anxiety Difficulty  Not difficult at all Not  difficult at all Not difficult at all      07/06/2024   10:20 AM 01/22/2024   10:16 AM 10/26/2023   12:04 PM  Depression screen PHQ 2/9  Decreased Interest 0 2 2  Down, Depressed, Hopeless 0 0 0  PHQ - 2 Score 0 2 2  Altered sleeping 1 2 2   Tired, decreased energy 3 2 3   Change in appetite 2 0 0  Feeling bad or failure about yourself  0 0 0  Trouble concentrating 1 0 1  Moving slowly or fidgety/restless 2 0 0  Suicidal thoughts 0 0 0  PHQ-9 Score 9 6 8   Difficult doing work/chores Somewhat difficult Not difficult at all Not difficult at all      07/06/2024   10:20 AM 01/22/2024   10:17 AM 10/26/2023   12:04 PM 07/21/2023   10:48 AM  GAD 7 : Generalized Anxiety Score  Nervous, Anxious, on Edge 0 0 0 0  Control/stop worrying 0 0 0 0  Worry too much - different things 0 0 0 0  Trouble relaxing 1 0 2 0  Restless 0 0 0 0  Easily annoyed or irritable 0 0 0 0  Afraid - awful might happen 0 0 0 0  Total GAD 7 Score 1 0 2 0  Anxiety Difficulty  Not difficult at all Not difficult at all Not difficult at all   SDOH Screenings   Food Insecurity: Food Insecurity Present (02/26/2024)   Received from [facility]  Housing: Low Risk  (02/26/2024)   Received from [facility]  Transportation Needs: No Transportation Needs (02/26/2024)   Received from [facility]  Utilities: Not At Risk (02/26/2024)   Received from [facility]  Alcohol Screen: Low Risk  (08/17/2023)  Depression (PHQ2-9): Medium Risk (07/06/2024)  Financial Resource Strain: Medium Risk (02/26/2024)   Received from [facility]  Physical Activity: Inactive (08/17/2023)  Social Connections: Socially Isolated (08/17/2023)  Stress: No Stress Concern Present (08/17/2023)  Tobacco Use: Medium Risk (07/06/2024)  Health Literacy: Adequate Health Literacy (08/17/2023)     Physical Exam Constitutional:      General: She is not in acute  distress.    Appearance: She is obese.  HENT:     Head: Normocephalic and atraumatic.     Nose:     Comments: Supplemental oxygen     Mouth/Throat:     Mouth: Mucous membranes are moist.  Cardiovascular:     Rate and Rhythm: Normal rate.  Pulmonary:     Effort: Pulmonary effort is normal.     Breath sounds: Normal breath sounds. No wheezing.  Abdominal:     General: Abdomen is protuberant. Bowel sounds are normal.  Palpations: Abdomen is soft.     Tenderness: There is no abdominal tenderness. There is no guarding.  Musculoskeletal:     Cervical back: Neck supple.     Right lower leg: No edema.     Left lower leg: No edema.  Lymphadenopathy:     Cervical: No cervical adenopathy.  Skin:    General: Skin is warm.  Neurological:     Mental Status: She is oriented to person, place, and time.  Psychiatric:        Mood and Affect: Mood normal.      Diabetic foot exam was performed with the following findings:   No deformities, ulcerations, or other skin breakdown Normal sensation of 10g monofilament Intact posterior tibialis and dorsalis pedis pulses     No results found for any visits on 07/06/24.  The ASCVD Risk score (Arnett DK, et al., 2019) failed to calculate for the following reasons:   The valid total cholesterol range is 130 to 320 mg/dL     Assessment & Plan:   Type 2 diabetes mellitus with diabetic neuropathy, without long-term current use of insulin  [facility]) Assessment & Plan: Involving b/l feet. Diabetic foot exam done, normal today. Continue Gabapentin  100 mg, twice a day, refill sent. PDMP reviewed.   Orders: -     Gabapentin ; Take 1 capsule (100 mg total) by mouth 2 (two) times daily.  Dispense: 180 capsule; Refill: 3  Type 2 diabetes mellitus with complications [facility]) Assessment & Plan: With hyperlipidemia, b/l lower leg neuropathy.  Repeat A1c, urine microalbumin.  Continue Metformin  1000 mg twice a day (500 mg 2 tab).  Continue Rosuvastatin  5 mg.   Continue Losartan  25 mg BID.  Influenza immunization updated.  Recommend updating shingles, tdap, COVID-19 through local pharmacy.    Orders: -     metFORMIN  HCl; Take 2 tablets (1,000 mg total) by mouth 2 (two) times daily with a meal.  Dispense: 360 tablet; Refill: 3 -     Hemoglobin A1c -     Microalbumin / creatinine urine ratio  Encounter for diabetic foot exam (HCC) Assessment & Plan: Completed today, normal.    Mixed hyperlipidemia -     Comprehensive metabolic panel with GFR -     Lipid panel  Nausea Assessment & Plan: Occasional mostly in the mornings that resolves with prn use of zofran  4 mg, refill sent. Recommend patient reevaluated if worsening or new GI symptoms.   Orders: -     Ondansetron  HCl; Take 1 tablet (4 mg total) by mouth daily as needed for nausea or vomiting.  Dispense: 20 tablet; Refill: 0  Allergic rhinitis, unspecified seasonality, unspecified trigger Assessment & Plan: Stable. Continue nasal Flonase  2 puff in each nostril, Zyrtec 10 mg and singular 10 mg daily (refill sent).   Orders: -     Fluticasone  Propionate; USE 1 OR 2 SPRAYS IN EACH NOSTRIL ONCE DAILY.  Dispense: 16 g; Refill: 3 -     Montelukast  Sodium; Take 1 tablet (10 mg total) by mouth at bedtime.  Dispense: 100 tablet; Refill: 0  Vitamin B 12 deficiency Assessment & Plan: H/O low B12 previously with current intermittent B12 use due to increased B12 level after daily supplement in the past. Repeat B12 level today.  Orders: -     Vitamin B12  Screening mammogram for breast cancer Assessment & Plan: Completed today, normal.   Orders: -     3D Screening Mammogram, Left and Right; Future  Needs flu shot -  Flu vaccine trivalent PF, 6mos and older(Flulaval,Afluria,Fluarix,Fluzone)  Chronic respiratory failure with hypoxia and hypercapnia (HCC) Assessment & Plan: On 2l/min oxygen  at night and 1l/min oxygen  during day time for COPD, CHF. Continue close f/u with pulmonology  and cardiology.    Chronic diastolic heart failure (HCC) Assessment & Plan: On supplemental oxygen , no chest pain, dyspnea, orthopnea from baseline.  Continue current medication: Losartan  25 mg BID, Metoprolol  25 mg daily, hydrochlorothiazide  12.5 mg daily, Lasix  20 mg daily.    Centrilobular emphysema (HCC) Assessment & Plan: COPD with emphysema requiring oxygen  therapy. Follows up with pulmonology closely. No new concerns. Does get oral candidiasis recurrently. Discussed oral hygiene. Continue f/u with Dr. Tamea.    Hypertension associated with diabetes (HCC) Assessment & Plan: BP within goal today of <130/80 mmHg. Continue current medications: Losartan  25 mg BID, Metoprolol  25 mg daily, hydrochlorothiazide  12.5 mg daily, Lasix  20 mg daily.    Thrush Assessment & Plan: Recurrent with prn use of nystatin . Oral hygiene discussed with the patient.    Chronic pain of both knees Assessment & Plan: Osteoarthritis of Bilateral Knees with Chronic Lower Extremity Pain and occasional hip pain.  Chronic knee pain managed with cortisone injections (f/u with [facility]), OTC meds, and Voltaren gel. - Encourage gentle stretching and activity. Consider physical therapy if pain persists.     Return in about 3 months (around 10/06/2024) for Diabetes.   Luke Shade, MD

## 2024-07-06 NOTE — Telephone Encounter (Signed)
 Copied from CRM [id number]. Topic: Clinical - Medication Question >> Jul 06, 2024 11:46 AM Mesmerise C wrote: Reason for CRM: Sam from [facility] stated they need a prescription for Tetanus shot or Tdap shot to be billed through the insurance

## 2024-07-06 NOTE — Assessment & Plan Note (Signed)
 Completed today, normal.

## 2024-07-06 NOTE — Assessment & Plan Note (Signed)
 H/O low B12 previously with current intermittent B12 use due to increased B12 level after daily supplement in the past. Repeat B12 level today.

## 2024-07-06 NOTE — Assessment & Plan Note (Signed)
 On 2l/min oxygen  at night and 1l/min oxygen  during day time for COPD, CHF. Continue close f/u with pulmonology and cardiology.

## 2024-07-08 NOTE — Telephone Encounter (Signed)
 Copied from CRM [id number]. Topic: General - Other >> Jul 08, 2024  9:16 AM Martha Woodard wrote: Reason for CRM: patient returning a phone call for lab results. As I was reading the patient the message she wanted to know her A1C and she stated for the nurse to give her call back  [phone]

## 2024-07-11 NOTE — Progress Notes (Signed)
 DANALEE FLATH                                          MRN: [id number]   07/11/2024   The [facility] Quality Team Specialist reviewed this patient medical record for the purposes of chart review for care gap closure. The following were reviewed: abstraction for care gap closure-glycemic status assessment and kidney health evaluation for diabetes:eGFR  and uACR.    [facility] Quality Team

## 2024-07-15 ENCOUNTER — Other Ambulatory Visit (INDEPENDENT_AMBULATORY_CARE_PROVIDER_SITE_OTHER)

## 2024-07-15 DIAGNOSIS — R945 Abnormal results of liver function studies: Secondary | ICD-10-CM | POA: Diagnosis not present

## 2024-07-15 LAB — HEPATIC FUNCTION PANEL
ALT: 56 U/L — ABNORMAL HIGH (ref 0–35)
AST: 45 U/L — ABNORMAL HIGH (ref 0–37)
Albumin: 4.4 g/dL (ref 3.5–5.2)
Alkaline Phosphatase: 63 U/L (ref 39–117)
Bilirubin, Direct: 0.1 mg/dL (ref 0.0–0.3)
Total Bilirubin: 0.4 mg/dL (ref 0.2–1.2)
Total Protein: 6.7 g/dL (ref 6.0–8.3)

## 2024-07-16 ENCOUNTER — Ambulatory Visit: Payer: Self-pay

## 2024-07-16 DIAGNOSIS — R945 Abnormal results of liver function studies: Secondary | ICD-10-CM

## 2024-07-16 NOTE — Progress Notes (Signed)
 Please let the patient know her liver function is slightly improved. I recommend patient to not take nay tylenol  or alcohol. I recommend repeating liver function and updating GGT, CBC in about 3-4 weeks, future lab has been ordered. Please help schedule lab only appointment and release GGT, CBC from 07/06/24.  Thank you,  Luke Shade, MD

## 2024-07-19 ENCOUNTER — Ambulatory Visit: Admitting: Pulmonary Disease

## 2024-07-20 ENCOUNTER — Ambulatory Visit: Admitting: Pulmonary Disease

## 2024-07-20 ENCOUNTER — Telehealth: Payer: Self-pay

## 2024-07-20 NOTE — Telephone Encounter (Signed)
 Please let patient know I have heard from credentialing and Dr. Abbey is credentialed with [facility].

## 2024-07-20 NOTE — Telephone Encounter (Signed)
 Patient notified and verbalized understanding.

## 2024-07-21 NOTE — Telephone Encounter (Signed)
 Patient was notified and made aware of provider's recommendations. Patient was advised if she needs us  or her pain worsens before PCP returns to get our office a call. Patient verbalized understanding.

## 2024-07-25 NOTE — Telephone Encounter (Signed)
 I recommend topical diclofenac gel 1% 4 times a day as needed on areas that is painful.  Thank you, Luke Shade, MD

## 2024-07-26 ENCOUNTER — Encounter: Payer: Self-pay | Admitting: Pulmonary Disease

## 2024-07-26 ENCOUNTER — Ambulatory Visit: Admitting: Pulmonary Disease

## 2024-07-26 VITALS — BP 140/70 | HR 94 | Temp 97.9°F | Ht 66.0 in | Wt 245.6 lb

## 2024-07-26 DIAGNOSIS — G8929 Other chronic pain: Secondary | ICD-10-CM

## 2024-07-26 DIAGNOSIS — M5416 Radiculopathy, lumbar region: Secondary | ICD-10-CM

## 2024-07-26 DIAGNOSIS — E662 Morbid (severe) obesity with alveolar hypoventilation: Secondary | ICD-10-CM

## 2024-07-26 DIAGNOSIS — J449 Chronic obstructive pulmonary disease, unspecified: Secondary | ICD-10-CM | POA: Diagnosis not present

## 2024-07-26 DIAGNOSIS — J9612 Chronic respiratory failure with hypercapnia: Secondary | ICD-10-CM

## 2024-07-26 DIAGNOSIS — M549 Dorsalgia, unspecified: Secondary | ICD-10-CM | POA: Diagnosis not present

## 2024-07-26 DIAGNOSIS — Z87891 Personal history of nicotine dependence: Secondary | ICD-10-CM

## 2024-07-26 DIAGNOSIS — J9611 Chronic respiratory failure with hypoxia: Secondary | ICD-10-CM

## 2024-07-26 DIAGNOSIS — R600 Localized edema: Secondary | ICD-10-CM | POA: Diagnosis not present

## 2024-07-26 DIAGNOSIS — J4489 Other specified chronic obstructive pulmonary disease: Secondary | ICD-10-CM

## 2024-07-26 DIAGNOSIS — R918 Other nonspecific abnormal finding of lung field: Secondary | ICD-10-CM

## 2024-07-26 NOTE — Progress Notes (Signed)
 Subjective:    Patient ID: Martha Woodard, female    DOB: Sep 09, 1966, 58 y.o.   MRN: [id number]  Patient Care Team: Bair, Kalpana, MD as PCP - General (Family Medicine) Perla Evalene PARAS, MD as Consulting Physician (Cardiology) Tamea Dedra CROME, MD as Consulting Physician (Pulmonary Disease) Mevelyn Charleston, OD as Referring Physician [facility])  Chief Complaint  Patient presents with   COPD    Shortness of breath on exertion. Occasional cough and wheezing.     BACKGROUND/INTERVAL:Martha Woodard is a 58 year old former smoker who presents today for follow-up asthma/COPD overlap syndrome. She is obtained on Bevespi  and Pulmicort  neb solution.  She was last seen on 17 March 2024.  No exacerbations since then.   PROBLEMS: Chronic hypoxic/hypercarbic respiratory failure Former smoker Moderate/severe COPD [facility] CHF Multiple pulmonary nodules   HPI Discussed the use of AI scribe software for clinical note transcription with the patient, who gave verbal consent to proceed.  History of Present Illness   Martha Woodard is a 58 year old female with severe COPD and chronic hypoxic respiratory failure who presents for follow-up.  She experiences severe leg pain and cramps. Her cardiology adjusted her medication, lowering the dose of niacin and adding Zetia . Despite these changes, she continues to experience leg cramps, though they are less severe. She was taking Tylenol  every four to six hours, which led to elevated liver enzymes, and was advised to stop taking it. She has not received further guidance on alternative pain management.  Her breathing is affected by weather changes and mold from falling leaves, which she finds irritating. She notes that her blood pressure was elevated at her last primary appointment, possibly due to pain.  She experiences swelling in her feet and ankles, which she describes as 'something terrible.' She has been receiving cortisone injections in her knees every three  months, which have been effective, but she now experiences shooting pain down her leg.  She is currently using Pulmicort  with a nebulizer and has received her flu shot.  She had been enrolled in lung cancer screening program however due to cost she abandon the program.  She is past due for CT to monitor her multiple lung nodules.  She does not note any no allergies except to Augmentin. She is not on blood thinners but takes one aspirin  a day.   With regards to her respiratory status she is stable on her baseline oxygen  supplementation of 1 L/min.  Dyspnea is at baseline.  No new complaints.     DATA: 09/28/2015 2D echo: EF 60 to 65%, grade 1 DD, mild left atrial dilation.  Right-sided pressures normal. 02/28/2020 PFTs:FEV1 1.42L or 49%, FVC or 2.24L 60%, FEV1/FVC 63%, no bronchodilator response.  RV is 122% indicating air trapping.  Diffusion capacity normal.  Consistent with moderate to severe COPD. 04/05/2020 chest x-ray: No active disease.  Consistent with COPD, left base scarring/atelectasis. 01/21/2022 LDCT: Mild to moderate centrilobular emphysema.  Multiple bilateral pulmonary nodules of which the larger were present on 01/17/2016 exam.  Some new nodules from 2017 exam.  Coronary artery calcifications.  Lung RADS 3 short-term follow-up 6 months ordered 07/16/2022 LDCT chest: Mild paraseptal and centrilobular emphysema use bronchial wall thickening.  Numerous scattered solid pulmonary nodules, solitary new basilar right upper lobe pulmonary nodule measuring 4.8 mm.  Lung RADS 3 probably benign finding short-term follow-up in 6 months recommended 09/15/2022 chest x-ray PA and lateral: Hyperinflation of the lungs, flattening of the diaphragms consistent with COPD, bronchial cuffing and increased  interstitial markings consistent with bronchitis.  Increased focal opacity in right base could represent atelectasis or early developing pneumonia, chronic opacity in the left base consistent with scar  atelectasis. 12/04/2022 chest x-ray PA and lateral: COPD with basilar scarring, no acute findings. 03/16/2024 PFTs: FEV1 1.37 L or 49% predicted, FVC 2.12 L or 59% predicted, FEV1/FVC 65%.  There is a modest response to bronchodilators.  Lung volumes are normal with mild hyperinflation and air trapping noted.  Diffusion capacity normal to supranormal.  Overall no significant decline from prior    Review of Systems A 10 point review of systems was performed and it is as noted above otherwise negative.   Patient Active Problem List   Diagnosis Date Noted   Type 2 diabetes mellitus with diabetic neuropathy, without long-term current use of insulin  (HCC) 07/06/2024   Nausea 07/06/2024   Vitamin B 12 deficiency 07/06/2024   Needs flu shot 07/06/2024   Abnormal liver function 07/06/2024   Sore in nose 10/26/2023   Lung nodule 04/14/2023   Thrush 01/12/2023   Lipoma 04/07/2022   Papilloma of oral cavity 12/13/2018   Chronic pain of both knees 06/16/2018   Allergic rhinitis 11/09/2017   Venous insufficiency 08/07/2017   Chronic respiratory failure (HCC) 11/13/2016   Encounter for diabetic foot exam (HCC) 02/13/2016   Thyroid  nodule 11/28/2015   COPD (chronic obstructive pulmonary disease) with emphysema (HCC) 11/13/2015   Chronic diastolic heart failure (HCC) 11/13/2015   Morbid obesity with BMI of 45.0-49.9, adult (HCC) 10/01/2015   Obesity hypoventilation syndrome (HCC) 10/01/2015   Hypertension associated with diabetes (HCC) 10/01/2015   Diabetes mellitus (HCC) 06/26/2014   Mixed hyperlipidemia 06/26/2014    Social History   Tobacco Use   Smoking status: Former    Current packs/day: 0.00    Average packs/day: 3.0 packs/day for 34.0 years (102.0 ttl pk-yrs)    Types: Cigarettes    Start date: 07/11/1979    Quit date: 07/10/2013    Years since quitting: 11.0   Smokeless tobacco: Never  Substance Use Topics   Alcohol use: No    Alcohol/week: 0.0 standard drinks of alcohol     Allergies  Allergen Reactions   Amoxicillin -Pot Clavulanate Itching and Hives    Hives  Can take PCN and Amoxicillin  alone    Current Meds  Medication Sig   Accu-Chek Softclix Lancets lancets TEST UP TO 4 TIMES DAILY   acetaminophen  (TYLENOL ) 650 MG CR tablet Take 650 mg by mouth every 8 (eight) hours as needed for pain.   albuterol  (PROVENTIL ) (2.5 MG/3ML) 0.083% nebulizer solution Take 3 mLs (2.5 mg total) by nebulization every 6 (six) hours as needed for wheezing or shortness of breath.   albuterol  (VENTOLIN  HFA) 108 (90 Base) MCG/ACT inhaler Inhale 2 puffs into the lungs every 6 (six) hours as needed for wheezing or shortness of breath.   aspirin  EC 81 MG tablet Take 1 tablet (81 mg total) by mouth daily.   budesonide  (PULMICORT ) 0.5 MG/2ML nebulizer solution Use 1 vial twice a daily as directed.   calcium -vitamin D  (OSCAL WITH D) 500-200 MG-UNIT tablet Take 1 tablet by mouth 2 (two) times daily.   cetirizine (ZYRTEC) 10 MG tablet Take 10 mg by mouth daily.   ezetimibe  (ZETIA ) 10 MG tablet Take 1 tablet (10 mg total) by mouth daily.   fluticasone  (FLONASE ) 50 MCG/ACT nasal spray USE 1 OR 2 SPRAYS IN EACH NOSTRIL ONCE DAILY.   furosemide  (LASIX ) 20 MG tablet TAKE 1 TABLET BY MOUTH  DAILY   gabapentin  (NEURONTIN ) 100 MG capsule Take 1 capsule (100 mg total) by mouth 2 (two) times daily.   glucose blood (ACCU-CHEK AVIVA PLUS) test strip TEST UP TO 4 TIMES DAILY AS DIRECTED   Glycopyrrolate-Formoterol  (BEVESPI  AEROSPHERE) 9-4.8 MCG/ACT AERO Inhale 2 puffs into the lungs 2 (two) times daily.   hydrochlorothiazide  (MICROZIDE ) 12.5 MG capsule TAKE 1 CAPSULE BY MOUTH DAILY   losartan  (COZAAR ) 25 MG tablet TAKE 1 TABLET BY MOUTH TWICE  DAILY   magnesium oxide (MAG-OX) 400 MG tablet Take 400 mg by mouth daily.   metFORMIN  (GLUCOPHAGE ) 500 MG tablet Take 2 tablets (1,000 mg total) by mouth 2 (two) times daily with a meal.   metoprolol  succinate (TOPROL -XL) 25 MG 24 hr tablet TAKE 1 TABLET  BY MOUTH DAILY   montelukast  (SINGULAIR ) 10 MG tablet Take 1 tablet (10 mg total) by mouth at bedtime.   Multiple Vitamin (MULTIVITAMIN) tablet Take 1 tablet by mouth daily.   nystatin  (MYCOSTATIN ) 100000 UNIT/ML suspension Take 5 mLs (500,000 Units total) by mouth 4 (four) times daily.   ondansetron  (ZOFRAN ) 4 MG tablet Take 1 tablet (4 mg total) by mouth daily as needed for nausea or vomiting.   potassium chloride  (KLOR-CON ) 10 MEQ tablet TAKE 1 TABLET BY MOUTH TWICE  DAILY   PROVENTIL  HFA 108 (90 Base) MCG/ACT inhaler Inhale 2 puffs into the lungs every 6 (six) hours as needed for wheezing or shortness of breath. 18 gram inhaler   rosuvastatin  (CRESTOR ) 5 MG tablet Take 1 tablet (5 mg total) by mouth daily.   sodium chloride  (OCEAN) 0.65 % SOLN nasal spray Place 2 sprays into both nostrils as needed for congestion.   vitamin C (ASCORBIC ACID) 500 MG tablet Take 1,000 mg by mouth daily.    Vitamin E 180 MG CAPS Take 180 mg by mouth daily.    Immunization History  Administered Date(s) Administered   Influenza, Seasonal, Injecte, Preservative Fre 07/21/2023, 07/06/2024   Influenza,inj,Quad PF,6+ Mos 07/02/2015, 08/19/2016, 08/07/2017, 06/16/2018, 06/29/2019, 07/31/2020, 07/05/2021, 09/10/2022   Moderna SARS-COV2 Booster Vaccination 11/05/2020   Moderna Sars-Covid-2 Vaccination 03/08/2020, 04/05/2020   PNEUMOCOCCAL CONJUGATE-20 07/21/2023   Pneumococcal Polysaccharide-23 09/26/2015   Pneumococcal-Unspecified 11/23/2020   Respiratory Syncytial Virus Vaccine ,Recomb Aduvanted(Arexvy ) 08/19/2023        Objective:   BP (!) 140/70   Pulse 94   Temp 97.9 F (36.6 C) (Temporal)   Ht 5' 6 (1.676 m)   Wt 245 lb 9.6 oz (111.4 kg)   LMP 04/10/2014   SpO2 95% Comment: 1L oxygen   BMI 39.64 kg/m   SpO2: 95 % (1L oxygen )  GENERAL: Obese woman, no acute distress, comfortable with nasal cannula O2.  Ambulatory with assistance of cane. HEAD: Normocephalic, atraumatic. EYES: Pupils equal,  round, reactive to light.  No scleral icterus. MOUTH: Dentition intact, oral mucosa moist, no thrush. NECK: Supple. No thyromegaly. Trachea midline. No JVD.  No adenopathy. PULMONARY: Mildly diminished breath sounds.  Coarse, otherwise no adventitious sounds.   CARDIOVASCULAR: S1 and S2. Regular rate and rhythm.  No rubs, murmurs or gallops heard. ABDOMEN: Obese, otherwise benign. MUSCULOSKELETAL: No joint deformity, no clubbing, no edema. NEUROLOGIC: No focal deficit, no gait disturbance, speech is fluent. SKIN: Intact,warm,dry. PSYCH: Mood and behavior normal.   Assessment & Plan:     ICD-10-CM   1. Stage 3 severe COPD by GOLD classification (HCC)  J44.9     2. Asthma-COPD overlap syndrome (HCC)  J44.89     3. Chronic respiratory failure with hypoxia  and hypercapnia (HCC)  J96.11    J96.12     4. Obesity hypoventilation syndrome (HCC)  E66.2     5. Multiple lung nodules on CT  R91.8 CT CHEST WO CONTRAST      Orders Placed This Encounter  Procedures   CT CHEST WO CONTRAST    Standing Status:   Future    Expected Date:   08/26/2024    Expiration Date:   07/26/2025    Is patient pregnant?:   No    Preferred imaging location?:   [facility]   Discussion:    Severe chronic obstructive pulmonary disease with chronic hypoxic respiratory failure COPD with chronic hypoxic respiratory failure, exacerbated by weather changes and environmental factors such as mold from leaves. Currently managed with Pulmicort  via nebulizer, which she is tolerating well. - Continue Pulmicort  via nebulizer - Ensure inhalers are refilled as needed - Order chest scan for lung cancer screening  Chronic low back pain with lumbar radiculopathy Chronic low back pain with radiculopathy, presenting as shooting pains down the legs. Previous surgery for pinched nerve. Pain management is challenging due to high liver enzymes from Tylenol  use. Considering alternative pain management options. - Patient to  call [facility] for evaluation of back pain, she is established patient - Consider Mobic for pain management after consulting primary care - Encourage use of Voltaren cream for localized pain relief until determination can be made with regards to use of Mobic (cannot be used concomitantly with Mobic)  Lower extremity edema Significant swelling in feet and ankles, possibly related to pain management and medication use. Requires further evaluation and management.  Swelling was not evident on today's exam. - Discuss with primary care for potential adjustments in pain management  Abnormal chest imaging Overdue for chest imaging as part of lung cancer screening. Previous concerns about cost of scans. - Order chest scan before next follow-up to ensure no progression of lung disease      Advised if symptoms do not improve or worsen, to please contact office for sooner follow up or seek emergency care.    I spent 32 minutes of dedicated to the care of this patient on the date of this encounter to include pre-visit review of records, face-to-face time with the patient discussing conditions above, post visit ordering of testing, clinical documentation with the electronic health record, making appropriate referrals as documented, and communicating necessary findings to members of the patients care team.     C. Leita Sanders, MD Advanced Bronchoscopy PCCM [facility]    *This note was generated using voice recognition software/Dragon and/or AI transcription program.  Despite best efforts to proofread, errors can occur which can change the meaning. Any transcriptional errors that result from this process are unintentional and may not be fully corrected at the time of dictation.

## 2024-07-26 NOTE — Telephone Encounter (Signed)
 Please let the patient know Mobic is in the same family as Ibuprofen. It does not cause as much of gastrointestinal side effects but still carries risk of worsening kidney function. She can take Tylenol  500 mg daily as needed rather than taking medications on NSAIDs family like Ibuprofen.   Thank you,  Luke Shade, MD

## 2024-07-26 NOTE — Patient Instructions (Signed)
 VISIT SUMMARY:  During your visit, we discussed your severe COPD, chronic low back pain, and swelling in your feet and ankles. We also reviewed your current medications and the need for a chest scan as part of your lung cancer screening.  YOUR PLAN:  -SEVERE CHRONIC OBSTRUCTIVE PULMONARY DISEASE WITH CHRONIC HYPOXIC RESPIRATORY FAILURE: This condition affects your lungs and makes it difficult to breathe, especially with weather changes and exposure to mold. You should continue using Pulmicort  with your nebulizer and ensure your inhalers are refilled as needed. We will also order a chest scan for your lung cancer screening.  -CHRONIC LOW BACK PAIN WITH LUMBAR RADICULOPATHY: This condition causes pain in your lower back that can shoot down your legs. We will refer you to [facility] for further evaluation. You should also use Voltaren cream for localized pain relief and consider seeing a chiropractor.  -LOWER EXTREMITY EDEMA: This is swelling in your feet and ankles. We need to discuss this with your primary care doctor to potentially adjust your pain management plan.  -ABNORMAL CHEST IMAGING: You are overdue for a chest scan, which is important for monitoring your lung health. We will order this scan before your next follow-up to ensure there is no progression of lung disease.  INSTRUCTIONS:  Please follow up with your primary care doctor to discuss potential adjustments in your pain management plan. Ensure you get your chest scan done before your next follow-up appointment.

## 2024-07-27 DIAGNOSIS — M9905 Segmental and somatic dysfunction of pelvic region: Secondary | ICD-10-CM | POA: Diagnosis not present

## 2024-07-27 DIAGNOSIS — M5432 Sciatica, left side: Secondary | ICD-10-CM | POA: Diagnosis not present

## 2024-07-27 DIAGNOSIS — M955 Acquired deformity of pelvis: Secondary | ICD-10-CM | POA: Diagnosis not present

## 2024-07-27 DIAGNOSIS — M9904 Segmental and somatic dysfunction of sacral region: Secondary | ICD-10-CM | POA: Diagnosis not present

## 2024-07-29 DIAGNOSIS — M5432 Sciatica, left side: Secondary | ICD-10-CM | POA: Diagnosis not present

## 2024-07-29 DIAGNOSIS — M9904 Segmental and somatic dysfunction of sacral region: Secondary | ICD-10-CM | POA: Diagnosis not present

## 2024-07-29 DIAGNOSIS — M955 Acquired deformity of pelvis: Secondary | ICD-10-CM | POA: Diagnosis not present

## 2024-07-29 DIAGNOSIS — M9905 Segmental and somatic dysfunction of pelvic region: Secondary | ICD-10-CM | POA: Diagnosis not present

## 2024-08-05 ENCOUNTER — Ambulatory Visit
Admission: RE | Admit: 2024-08-05 | Discharge: 2024-08-05 | Disposition: A | Discharge status: Home / Self Care | Source: Ambulatory Visit

## 2024-08-05 DIAGNOSIS — Z1231 Encounter for screening mammogram for malignant neoplasm of breast: Secondary | ICD-10-CM | POA: Insufficient documentation

## 2024-08-08 ENCOUNTER — Other Ambulatory Visit: Payer: Self-pay | Admitting: Cardiovascular Disease

## 2024-08-08 DIAGNOSIS — I491 Atrial premature depolarization: Secondary | ICD-10-CM

## 2024-08-09 ENCOUNTER — Other Ambulatory Visit: Payer: Self-pay

## 2024-08-09 DIAGNOSIS — R928 Other abnormal and inconclusive findings on diagnostic imaging of breast: Secondary | ICD-10-CM

## 2024-08-09 NOTE — Progress Notes (Signed)
 Her A1c was 6.5%, it was 6.3% on 10/26/23.   Thank you,  Luke Shade, MD

## 2024-08-10 NOTE — Progress Notes (Signed)
 Spoke with patient and was informed that she has already been informed of this request by provider. Disregard CRM is from 07/08/24. Patient verbalized understanding and has no questions at this time.

## 2024-08-11 ENCOUNTER — Ambulatory Visit
Admission: RE | Admit: 2024-08-11 | Discharge: 2024-08-11 | Disposition: A | Discharge status: Home / Self Care | Source: Ambulatory Visit

## 2024-08-11 ENCOUNTER — Other Ambulatory Visit: Payer: Self-pay

## 2024-08-11 ENCOUNTER — Inpatient Hospital Stay: Admission: RE | Admit: 2024-08-11 | Discharge: 2024-08-11

## 2024-08-11 DIAGNOSIS — R928 Other abnormal and inconclusive findings on diagnostic imaging of breast: Secondary | ICD-10-CM | POA: Insufficient documentation

## 2024-08-11 DIAGNOSIS — C50412 Malignant neoplasm of upper-outer quadrant of left female breast: Secondary | ICD-10-CM | POA: Insufficient documentation

## 2024-08-11 HISTORY — PX: BREAST BIOPSY: SHX20

## 2024-08-11 MED ORDER — LIDOCAINE 1 % OPTIME INJ - NO CHARGE
2.0000 mL | Freq: Once | INTRAMUSCULAR | Status: AC
  Administered 2024-08-11: 2 mL via INTRADERMAL
  Filled 2024-08-11: qty 2

## 2024-08-11 MED ORDER — LIDOCAINE-EPINEPHRINE 1 %-1:100000 IJ SOLN
8.0000 mL | Freq: Once | INTRAMUSCULAR | Status: AC
  Administered 2024-08-11: 8 mL via INTRADERMAL

## 2024-08-12 ENCOUNTER — Ambulatory Visit: Payer: Self-pay

## 2024-08-12 LAB — SURGICAL PATHOLOGY

## 2024-08-12 NOTE — Progress Notes (Signed)
 Noted. Waiting biopsy path result.   Luke Shade, MD

## 2024-08-12 NOTE — Progress Notes (Signed)
 Noted.  Martha Mascot, MD

## 2024-08-12 NOTE — Progress Notes (Signed)
 Talked with the patient on the phone. She is already aware about the biopsy results. Radiology department already reached out to the patient with results and discussed follow up plan.   Luke Shade, MD

## 2024-08-15 ENCOUNTER — Encounter: Payer: Self-pay | Admitting: *Deleted

## 2024-08-15 ENCOUNTER — Other Ambulatory Visit

## 2024-08-15 DIAGNOSIS — R945 Abnormal results of liver function studies: Secondary | ICD-10-CM

## 2024-08-15 DIAGNOSIS — C50919 Malignant neoplasm of unspecified site of unspecified female breast: Secondary | ICD-10-CM

## 2024-08-15 LAB — CBC WITH DIFFERENTIAL/PLATELET
Basophils Absolute: 0 K/uL (ref 0.0–0.1)
Basophils Relative: 0.8 % (ref 0.0–3.0)
Eosinophils Absolute: 0.2 K/uL (ref 0.0–0.7)
Eosinophils Relative: 3.2 % (ref 0.0–5.0)
HCT: 43.1 % (ref 36.0–46.0)
Hemoglobin: 14 g/dL (ref 12.0–15.0)
Lymphocytes Relative: 29.7 % (ref 12.0–46.0)
Lymphs Abs: 1.6 K/uL (ref 0.7–4.0)
MCHC: 32.6 g/dL (ref 30.0–36.0)
MCV: 86.3 fl (ref 78.0–100.0)
Monocytes Absolute: 0.4 K/uL (ref 0.1–1.0)
Monocytes Relative: 7.7 % (ref 3.0–12.0)
Neutro Abs: 3.2 K/uL (ref 1.4–7.7)
Neutrophils Relative %: 58.6 % (ref 43.0–77.0)
Platelets: 256 K/uL (ref 150.0–400.0)
RBC: 4.99 Mil/uL (ref 3.87–5.11)
RDW: 14.4 % (ref 11.5–15.5)
WBC: 5.5 K/uL (ref 4.0–10.5)

## 2024-08-15 LAB — HEPATIC FUNCTION PANEL
ALT: 45 U/L — ABNORMAL HIGH (ref 0–35)
AST: 42 U/L — ABNORMAL HIGH (ref 0–37)
Albumin: 4.4 g/dL (ref 3.5–5.2)
Alkaline Phosphatase: 54 U/L (ref 39–117)
Bilirubin, Direct: 0.1 mg/dL (ref 0.0–0.3)
Total Bilirubin: 0.3 mg/dL (ref 0.2–1.2)
Total Protein: 6.6 g/dL (ref 6.0–8.3)

## 2024-08-15 LAB — GAMMA GT: GGT: 51 U/L (ref 7–51)

## 2024-08-15 NOTE — Progress Notes (Signed)
 Received referral for newly diagnosed breast cancer from [facility].  Navigation initiated.  She will see Dr. Rennie on Wed. 11/19 at 11.  Referral sent to [facility], their office will call her with an appt.

## 2024-08-16 NOTE — Telephone Encounter (Signed)
 Open in error

## 2024-08-16 NOTE — Progress Notes (Signed)
 Please let the patient know her CBC looks good. Her liver enzyme continues to be mildly elevated but improved when compared to 1 month ago. Recommend continue monitoring and avoiding in take of any over the counter supplements. If she has right upper abdominal pain I recommend getting ultrasound of liver. Since she is recently diagnosed with breast cancer and is navigating with new health challenges, if she is asymptomatic, it is okay for us  to monitor and wait on the liver ultrasound.   Thank you,  Luke Shade, MD

## 2024-08-17 ENCOUNTER — Inpatient Hospital Stay

## 2024-08-17 ENCOUNTER — Encounter: Payer: Self-pay | Admitting: Internal Medicine

## 2024-08-17 ENCOUNTER — Inpatient Hospital Stay: Attending: Internal Medicine | Admitting: Internal Medicine

## 2024-08-17 ENCOUNTER — Encounter: Payer: Self-pay | Admitting: *Deleted

## 2024-08-17 VITALS — BP 118/74 | HR 87 | Temp 97.8°F | Resp 18 | Ht 66.0 in | Wt 240.0 lb

## 2024-08-17 DIAGNOSIS — Z79899 Other long term (current) drug therapy: Secondary | ICD-10-CM | POA: Insufficient documentation

## 2024-08-17 DIAGNOSIS — Z7982 Long term (current) use of aspirin: Secondary | ICD-10-CM | POA: Diagnosis not present

## 2024-08-17 DIAGNOSIS — Z8041 Family history of malignant neoplasm of ovary: Secondary | ICD-10-CM | POA: Diagnosis not present

## 2024-08-17 DIAGNOSIS — Z860100 Personal history of colon polyps, unspecified: Secondary | ICD-10-CM | POA: Diagnosis not present

## 2024-08-17 DIAGNOSIS — Z87891 Personal history of nicotine dependence: Secondary | ICD-10-CM | POA: Insufficient documentation

## 2024-08-17 DIAGNOSIS — Z1732 Human epidermal growth factor receptor 2 negative status: Secondary | ICD-10-CM | POA: Insufficient documentation

## 2024-08-17 DIAGNOSIS — I110 Hypertensive heart disease with heart failure: Secondary | ICD-10-CM | POA: Diagnosis not present

## 2024-08-17 DIAGNOSIS — B370 Candidal stomatitis: Secondary | ICD-10-CM

## 2024-08-17 DIAGNOSIS — C50412 Malignant neoplasm of upper-outer quadrant of left female breast: Secondary | ICD-10-CM | POA: Insufficient documentation

## 2024-08-17 DIAGNOSIS — Z8059 Family history of malignant neoplasm of other urinary tract organ: Secondary | ICD-10-CM | POA: Insufficient documentation

## 2024-08-17 DIAGNOSIS — E785 Hyperlipidemia, unspecified: Secondary | ICD-10-CM | POA: Insufficient documentation

## 2024-08-17 DIAGNOSIS — E119 Type 2 diabetes mellitus without complications: Secondary | ICD-10-CM | POA: Diagnosis not present

## 2024-08-17 DIAGNOSIS — Z803 Family history of malignant neoplasm of breast: Secondary | ICD-10-CM | POA: Insufficient documentation

## 2024-08-17 DIAGNOSIS — Z800 Family history of malignant neoplasm of digestive organs: Secondary | ICD-10-CM | POA: Diagnosis not present

## 2024-08-17 DIAGNOSIS — Z9189 Other specified personal risk factors, not elsewhere classified: Secondary | ICD-10-CM

## 2024-08-17 DIAGNOSIS — Z7951 Long term (current) use of inhaled steroids: Secondary | ICD-10-CM | POA: Insufficient documentation

## 2024-08-17 DIAGNOSIS — Z1721 Progesterone receptor positive status: Secondary | ICD-10-CM | POA: Diagnosis not present

## 2024-08-17 DIAGNOSIS — I5032 Chronic diastolic (congestive) heart failure: Secondary | ICD-10-CM | POA: Diagnosis not present

## 2024-08-17 DIAGNOSIS — Z7984 Long term (current) use of oral hypoglycemic drugs: Secondary | ICD-10-CM | POA: Diagnosis not present

## 2024-08-17 DIAGNOSIS — Z170 Estrogen receptor positive status [ER+]: Secondary | ICD-10-CM | POA: Diagnosis not present

## 2024-08-17 NOTE — Progress Notes (Signed)
 C/o no energy, on going for a long time.   Pt has questions about what type cancer she has, is it fast spreading? She did have a bx 08/11/24, did they take out the whole mass or just a small bx?

## 2024-08-17 NOTE — Progress Notes (Signed)
 [facility] CONSULT NOTE  Patient Care Team: Abbey Bruckner, MD as PCP - General (Family Medicine) Perla, Evalene PARAS, MD as Consulting Physician (Cardiology) Tamea Dedra CROME, MD as Consulting Physician (Pulmonary Disease) Mevelyn Charleston, OD as Referring Physician (Optometry) Georgina Shasta POUR, RN as Oncology Nurse Navigator Rennie Cindy SAUNDERS, MD as Consulting Physician (Oncology)  CHIEF COMPLAINTS/PURPOSE OF CONSULTATION: Breast cancer  #  Oncology History Overview Note  1. Breast, left, needle core biopsy, 2:00 15cmfn 5mm (coil clip) :       INVASIVE MAMMARY CARCINOMA       MAMMARY CARCINOMA IN SITU, SOLID INTERMEDIATE NUCLEAR GRADE       TUBULE FORMATION: SCORE 3       NUCLEAR PLEOMORPHISM: SCORE 2       MITOTIC COUNT: SCORE 1       TOTAL SCORE: 6       OVERALL GRADE: 2       LYMPHOVASCULAR INVASION: NOT IDENTIFIED   ER-100%; PR 100% HER2 negative Ki-67 5%   Malignant neoplasm of upper-outer quadrant of left breast in female, estrogen receptor positive (HCC)  08/17/2024 Initial Diagnosis   Malignant neoplasm of upper-outer quadrant of left breast in female, estrogen receptor positive (HCC)   08/17/2024 Cancer Staging   Staging form: Breast, AJCC 8th Edition - Clinical: Stage IA (cT1b, cN0, cM0, G2, ER+, PR+, HER2-) - Signed by Rennie Cindy SAUNDERS, MD on 08/17/2024 Histologic grading system: 3 grade system      HISTORY OF PRESENTING ILLNESS:  Martha Woodard 58 y.o.  female female with no prior history of breast cancer/or malignancies has been referred to us  for further evaluation recommendations for new diagnosis of breast cancer.  Discussed the use of AI scribe software for clinical note transcription with the patient, who gave verbal consent to proceed.  History of Present Illness   Martha Woodard is a 58 year old female with a recent diagnosis of stage one breast cancer who presents for oncology consultation.  She underwent a routine mammogram on  August 05, 2024, which led to a follow-up on August 11, 2024, where a small mass was identified in the left breast. The mass was not palpable and initially caused no pain, but there is soreness likely due to the ultrasound procedure. The mass is approximately 6 millimeters in size, and no lymph nodes were seen on imaging.  She has a history of diabetes, with her last A1c recorded at 6.5, indicating good control. She manages her diabetes with metformin .  She has chronic obstructive pulmonary disease (COPD) and lung nodules, requiring oxygen  therapy since 2017. She uses 1 liter of oxygen  during the day and 2 liters at night. She quit smoking 11 years ago after a 30-year history of smoking.  She has a history of arrhythmia, managed with daily aspirin . She experiences episodes of irregular heart rhythm and uses oxygen  at night to prevent breathing issues.  She experiences swelling in her legs, managed with furosemide  and hydrochlorothiazide . She finds compression stockings uncomfortable and does not use them.  Her family history is significant for cancer, including her mother who had ovarian and urethral cancer, two sisters with breast cancer, and a brother with colon cancer. There is no known genetic testing done in the family.       Review of Systems  Constitutional:  Positive for malaise/fatigue. Negative for chills, diaphoresis, fever and weight loss.  HENT:  Negative for nosebleeds and sore throat.   Eyes:  Negative for double  vision.  Respiratory:  Positive for shortness of breath. Negative for cough, hemoptysis, sputum production and wheezing.   Cardiovascular:  Negative for chest pain, palpitations, orthopnea and leg swelling.  Gastrointestinal:  Negative for abdominal pain, blood in stool, constipation, diarrhea, heartburn, melena, nausea and vomiting.  Genitourinary:  Negative for dysuria, frequency and urgency.  Musculoskeletal:  Positive for back pain and joint pain.  Skin: Negative.   Negative for itching and rash.  Neurological:  Negative for dizziness, tingling, focal weakness, weakness and headaches.  Endo/Heme/Allergies:  Does not bruise/bleed easily.  Psychiatric/Behavioral:  Negative for depression. The patient is not nervous/anxious and does not have insomnia.      MEDICAL HISTORY:  Past Medical History:  Diagnosis Date   CHF (congestive heart failure) (HCC)    Colon polyp 01-01-2016   COPD (chronic obstructive pulmonary disease) (HCC)    Diabetes mellitus without complication (HCC)    Diastolic heart failure (HCC)    HLD (hyperlipidemia)    Hypertension    Morbid obesity with BMI of 45.0-49.9, adult (HCC)    Multiple lung nodules on CT    Shortness of breath dyspnea     SURGICAL HISTORY: Past Surgical History:  Procedure Laterality Date   BIOPSY THYROID  Right 02/06/2014   NEGATIVE FOR MALIGNANT CELLS proteinaceous material and macrophages.   BREAST BIOPSY Left    neg- core   BREAST BIOPSY Left 08/11/2024   US  LT BREAST BX W LOC DEV 1ST LESION IMG BX SPEC US  GUIDE 08/11/2024 [facility]   COLONOSCOPY WITH PROPOFOL  N/A 01/01/2016   Procedure: COLONOSCOPY WITH PROPOFOL ;  Surgeon: Rogelia Copping, MD;  Location: [facility] ENDOSCOPY;  Service: Endoscopy;  Laterality: N/A;   RECONSTRUCTION OF EYELID  06/16/2024   thryoid fna Right 12/2013   Proteinaceous material and macrophages.   WISDOM TOOTH EXTRACTION      SOCIAL HISTORY: Social History   Socioeconomic History   Marital status: Single    Spouse name: Not on file   Number of children: Not on file   Years of education: Not on file   Highest education level: Not on file  Occupational History   Not on file  Tobacco Use   Smoking status: Former    Current packs/day: 0.00    Average packs/day: 3.0 packs/day for 34.0 years (102.0 ttl pk-yrs)    Types: Cigarettes    Start date: 07/11/1979    Quit date: 07/10/2013    Years since quitting: 11.1   Smokeless tobacco: Never  Vaping Use   Vaping  status: Former  Substance and Sexual Activity   Alcohol use: No    Alcohol/week: 0.0 standard drinks of alcohol   Drug use: No   Sexual activity: Not Currently  Other Topics Concern   Not on file  Social History Narrative   Not on file   Social Drivers of Health   Financial Resource Strain: Medium Risk (02/26/2024)   Received from [facility]   Overall Financial Resource Strain (CARDIA)    Difficulty of Paying Living Expenses: Somewhat hard  Food Insecurity: No Food Insecurity (08/17/2024)   Hunger Vital Sign    Worried About Running Out of Food in the Last Year: Never true    Ran Out of Food in the Last Year: Never true  Transportation Needs: No Transportation Needs (08/17/2024)   PRAPARE - Administrator, Civil Service (Medical): No    Lack of Transportation (Non-Medical): No  Physical Activity: Inactive (08/17/2023)   Exercise Vital Sign  Days of Exercise per Week: 0 days    Minutes of Exercise per Session: 0 min  Stress: No Stress Concern Present (08/17/2023)   [employer] of Occupational Health - Occupational Stress Questionnaire    Feeling of Stress : Only a little  Social Connections: Socially Isolated (08/17/2023)   Social Connection and Isolation Panel    Frequency of Communication with Friends and Family: More than three times a week    Frequency of Social Gatherings with Friends and Family: More than three times a week    Attends Religious Services: Never    Database Administrator or Organizations: No    Attends Banker Meetings: Never    Marital Status: Divorced  Catering Manager Violence: Not At Risk (08/17/2024)   Humiliation, Afraid, Rape, and Kick questionnaire    Fear of Current or Ex-Partner: No    Emotionally Abused: No    Physically Abused: No    Sexually Abused: No    FAMILY HISTORY: Family History  Problem Relation Age of Onset   Cancer Mother 42       uterian and ovarian    Goiter Mother     Stroke Father    Breast cancer Sister 37   Breast cancer Sister 65   Heart murmur Sister    Stroke Brother    Colon cancer Brother     ALLERGIES:  is allergic to amoxicillin -pot clavulanate.  MEDICATIONS:  Current Outpatient Medications  Medication Sig Dispense Refill   Accu-Chek Softclix Lancets lancets TEST UP TO 4 TIMES DAILY 400 each 3   acetaminophen  (TYLENOL ) 650 MG CR tablet Take 650 mg by mouth every 8 (eight) hours as needed for pain.     albuterol  (PROVENTIL ) (2.5 MG/3ML) 0.083% nebulizer solution Take 3 mLs (2.5 mg total) by nebulization every 6 (six) hours as needed for wheezing or shortness of breath. 125 mL 12   albuterol  (VENTOLIN  HFA) 108 (90 Base) MCG/ACT inhaler Inhale 2 puffs into the lungs every 6 (six) hours as needed for wheezing or shortness of breath. 54 g 2   aspirin  EC 81 MG tablet Take 1 tablet (81 mg total) by mouth daily. 90 tablet 3   budesonide  (PULMICORT ) 0.5 MG/2ML nebulizer solution Use 1 vial twice a daily as directed. 60 mL 11   calcium -vitamin D  (OSCAL WITH D) 500-200 MG-UNIT tablet Take 1 tablet by mouth daily with breakfast.     cetirizine (ZYRTEC) 10 MG tablet Take 10 mg by mouth daily.     ezetimibe  (ZETIA ) 10 MG tablet Take 1 tablet (10 mg total) by mouth daily. 90 tablet 3   fluticasone  (FLONASE ) 50 MCG/ACT nasal spray USE 1 OR 2 SPRAYS IN EACH NOSTRIL ONCE DAILY. 16 g 3   furosemide  (LASIX ) 20 MG tablet TAKE 1 TABLET BY MOUTH DAILY 100 tablet 2   gabapentin  (NEURONTIN ) 100 MG capsule Take 1 capsule (100 mg total) by mouth 2 (two) times daily. 180 capsule 3   glucose blood (ACCU-CHEK AVIVA PLUS) test strip TEST UP TO 4 TIMES DAILY AS DIRECTED 400 strip 3   Glycopyrrolate-Formoterol  (BEVESPI  AEROSPHERE) 9-4.8 MCG/ACT AERO Inhale 2 puffs into the lungs 2 (two) times daily. 10.7 g 11   hydrochlorothiazide  (MICROZIDE ) 12.5 MG capsule TAKE 1 CAPSULE BY MOUTH DAILY 100 capsule 2   losartan  (COZAAR ) 25 MG tablet TAKE 1 TABLET BY MOUTH TWICE  DAILY 200  tablet 2   magnesium oxide (MAG-OX) 400 MG tablet Take 400 mg by mouth daily.  metFORMIN  (GLUCOPHAGE ) 500 MG tablet Take 2 tablets (1,000 mg total) by mouth 2 (two) times daily with a meal. 360 tablet 3   metoprolol  succinate (TOPROL -XL) 25 MG 24 hr tablet TAKE 1 TABLET BY MOUTH DAILY 100 tablet 2   montelukast  (SINGULAIR ) 10 MG tablet Take 1 tablet (10 mg total) by mouth at bedtime. 100 tablet 0   Multiple Vitamin (MULTIVITAMIN) tablet Take 1 tablet by mouth daily.     nystatin  (MYCOSTATIN ) 100000 UNIT/ML suspension Take 5 mLs (500,000 Units total) by mouth 4 (four) times daily. (Patient taking differently: Take 5 mLs by mouth 4 (four) times daily as needed.) 473 mL 1   ondansetron  (ZOFRAN ) 4 MG tablet Take 1 tablet (4 mg total) by mouth daily as needed for nausea or vomiting. 20 tablet 0   potassium chloride  (KLOR-CON ) 10 MEQ tablet TAKE 1 TABLET BY MOUTH TWICE  DAILY 200 tablet 2   PROVENTIL  HFA 108 (90 Base) MCG/ACT inhaler Inhale 2 puffs into the lungs every 6 (six) hours as needed for wheezing or shortness of breath. 18 gram inhaler 3 each 1   rosuvastatin  (CRESTOR ) 5 MG tablet Take 1 tablet (5 mg total) by mouth daily. 90 tablet 3   sodium chloride  (OCEAN) 0.65 % SOLN nasal spray Place 2 sprays into both nostrils as needed for congestion. 30 mL 11   vitamin C (ASCORBIC ACID) 500 MG tablet Take 1,000 mg by mouth daily.      No current facility-administered medications for this visit.      SABRA  PHYSICAL EXAMINATION:  Vitals:   08/17/24 1040  BP: 118/74  Pulse: 87  Resp: 18  Temp: 97.8 F (36.6 C)  SpO2: 95%   Filed Weights   08/17/24 1040  Weight: 240 lb (108.9 kg)    Physical Exam Vitals and nursing note reviewed.  HENT:     Head: Normocephalic and atraumatic.     Mouth/Throat:     Pharynx: Oropharynx is clear.  Eyes:     Extraocular Movements: Extraocular movements intact.     Pupils: Pupils are equal, round, and reactive to light.  Cardiovascular:     Rate and  Rhythm: Normal rate and regular rhythm.  Pulmonary:     Comments: Decreased breath sounds bilaterally.  Abdominal:     Palpations: Abdomen is soft.  Musculoskeletal:        General: Normal range of motion.     Cervical back: Normal range of motion.  Skin:    General: Skin is warm.  Neurological:     General: No focal deficit present.     Mental Status: She is alert and oriented to person, place, and time.  Psychiatric:        Behavior: Behavior normal.        Judgment: Judgment normal.      LABORATORY DATA:  I have reviewed the data as listed Lab Results  Component Value Date   WBC 5.5 08/15/2024   HGB 14.0 08/15/2024   HCT 43.1 08/15/2024   MCV 86.3 08/15/2024   PLT 256.0 08/15/2024   Recent Labs    10/26/23 1224 07/06/24 1059 07/15/24 1101 08/15/24 1059  NA 141 144  --   --   K 3.9 4.2  --   --   CL 102 100  --   --   CO2 34* 33*  --   --   GLUCOSE 85 94  --   --   BUN 11 14  --   --  CREATININE 0.56 0.59  --   --   CALCIUM  9.9 10.2  --   --   PROT 6.9 6.7 6.7 6.6  ALBUMIN 4.4 4.5 4.4 4.4  AST 30 56* 45* 42*  ALT 30 61* 56* 45*  ALKPHOS 55 50 63 54  BILITOT 0.3 0.4 0.4 0.3  BILIDIR  --   --  0.1 0.1    RADIOGRAPHIC STUDIES: I have personally reviewed the radiological images as listed and agreed with the findings in the report. US  LT BREAST BX W LOC DEV 1ST LESION IMG BX SPEC US  GUIDE Addendum Date: 08/12/2024 ADDENDUM REPORT: 08/12/2024 13:51 ADDENDUM: PATHOLOGY revealed: Breast, left, needle core biopsy, 2:00 15 cmfn 5 mm (coil clip)- INVASIVE MAMMARY CARCINOMA MAMMARY CARCINOMA IN SITU, SOLID INTERMEDIATE NUCLEAR GRADE, OVERALL GRADE: 2. LYMPHOVASCULAR INVASION: NOT IDENTIFIED CANCER LENGTH: 4.5 MM / 0.45 CM CALCIFICATIONS: PRESENT. Pathology results are CONCORDANT with imaging findings, per Dr. Norleen Croak. Pathology results and recommendations were discussed with patient via telephone on 08/12/2024 by Rock Hover RN. Patient reported biopsy site doing  well with no adverse symptoms, and only slight tenderness at the site. Post biopsy care instructions were reviewed, questions were answered and my direct phone number was provided. Patient was instructed to call [facility] for any additional questions or concerns related to biopsy site. RECOMMENDATIONS: 1. Surgical and oncological consultation. Request for surgical and oncological consultation relayed to Shasta Ada RN at [facility] by Rock Hover RN on 08/12/2024. Pathology results reported by Rock Hover RN on 08/12/2024. Electronically Signed   By: Norleen Croak M.D.   On: 08/12/2024 13:51   Result Date: 08/12/2024 CLINICAL DATA:  Indeterminate LEFT breast mass at 2 o'clock EXAM: ULTRASOUND GUIDED LEFT BREAST CORE NEEDLE BIOPSY COMPARISON:  Previous exam(s). PROCEDURE: I met with the patient and we discussed the procedure of ultrasound-guided biopsy, including benefits and alternatives. We discussed the high likelihood of a successful procedure. We discussed the risks of the procedure, including infection, bleeding, tissue injury, clip migration, and inadequate sampling. Informed written consent was given. The usual time-out protocol was performed immediately prior to the procedure. Lesion quadrant: Upper-outer Using sterile technique and 1% lidocaine  and 1% lidocaine  with epinephrine as local anesthetic, under direct ultrasound visualization, a 14 gauge spring-loaded device was used to perform biopsy of the LEFT breast mass at 2 o'clock 15 cm from the nipple using a MEDIAL approach. At the conclusion of the procedure coil shaped tissue marker clip was deployed into the biopsy cavity. Follow up 2 view mammogram was performed and dictated separately. IMPRESSION: Ultrasound guided biopsy of the LEFT breast mass at 2 o'clock. No apparent complications. Electronically Signed: By: Norleen Croak M.D. On: 08/11/2024 11:15   MM CLIP PLACEMENT LEFT Result Date: 08/11/2024 CLINICAL DATA:   Status post ultrasound-guided biopsy of the LEFT breast EXAM: 3D DIAGNOSTIC LEFT MAMMOGRAM POST ULTRASOUND BIOPSY COMPARISON:  Previous exam(s). ACR Breast Density Category b: There are scattered areas of fibroglandular density. FINDINGS: 3D Mammographic images were obtained following ultrasound guided biopsy of the LEFT breast mass at 2 o'clock 15 cm from the nipple. The biopsy marking clip is in expected position at the site of biopsy. IMPRESSION: Appropriate positioning of the coil shaped biopsy marking clip at the site of biopsy in the LEFT breast. Final Assessment: Post Procedure Mammograms for Marker Placement Electronically Signed   By: Norleen Croak M.D.   On: 08/11/2024 15:05   MM 3D DIAGNOSTIC MAMMOGRAM UNILATERAL LEFT BREAST Result Date:  08/11/2024 CLINICAL DATA:  LEFT breast callback EXAM: DIGITAL DIAGNOSTIC UNILATERAL LEFT MAMMOGRAM WITH TOMOSYNTHESIS AND CAD; ULTRASOUND LEFT BREAST LIMITED TECHNIQUE: Left digital diagnostic mammography and breast tomosynthesis was performed. The images were evaluated with computer-aided detection. ; Targeted ultrasound examination of the left breast was performed. COMPARISON:  Previous exam(s). ACR Breast Density Category b: There are scattered areas of fibroglandular density. FINDINGS: Spot compression tomosynthesis views confirm a persistent focal asymmetry in the LEFT upper outer breast at middle depth. This is not stable compared to priors. Targeted ultrasound was performed of the LEFT breast. At 2 o'clock 15 cm from the nipple, there is an irregular hypoechoic mass with an echogenic rind. This measures 6 by 3 by 3 mm. This is favored to correspond to the site of mammographic concern. Targeted ultrasound was performed LEFT axilla. No suspicious axillary lymph nodes are visualized. IMPRESSION: 1. There is a moderately suspicious 6 mm mass at the site of screening mammographic concern. Recommend ultrasound-guided biopsy for definitive characterization. 2. No  suspicious LEFT axillary adenopathy. RECOMMENDATION: LEFT breast ultrasound-guided biopsy x1 I have discussed the findings and recommendations with the patient. The biopsy procedure was discussed with the patient and questions were answered. Patient expressed their understanding of the biopsy recommendation. Patient will be scheduled for biopsy today. Ordering provider will be notified. If applicable, a reminder letter will be sent to the patient regarding the next appointment. BI-RADS CATEGORY  4: Suspicious. Electronically Signed   By: Corean Salter M.D.   On: 08/11/2024 10:47   US  LIMITED ULTRASOUND INCLUDING AXILLA LEFT BREAST  Result Date: 08/11/2024 CLINICAL DATA:  LEFT breast callback EXAM: DIGITAL DIAGNOSTIC UNILATERAL LEFT MAMMOGRAM WITH TOMOSYNTHESIS AND CAD; ULTRASOUND LEFT BREAST LIMITED TECHNIQUE: Left digital diagnostic mammography and breast tomosynthesis was performed. The images were evaluated with computer-aided detection. ; Targeted ultrasound examination of the left breast was performed. COMPARISON:  Previous exam(s). ACR Breast Density Category b: There are scattered areas of fibroglandular density. FINDINGS: Spot compression tomosynthesis views confirm a persistent focal asymmetry in the LEFT upper outer breast at middle depth. This is not stable compared to priors. Targeted ultrasound was performed of the LEFT breast. At 2 o'clock 15 cm from the nipple, there is an irregular hypoechoic mass with an echogenic rind. This measures 6 by 3 by 3 mm. This is favored to correspond to the site of mammographic concern. Targeted ultrasound was performed LEFT axilla. No suspicious axillary lymph nodes are visualized. IMPRESSION: 1. There is a moderately suspicious 6 mm mass at the site of screening mammographic concern. Recommend ultrasound-guided biopsy for definitive characterization. 2. No suspicious LEFT axillary adenopathy. RECOMMENDATION: LEFT breast ultrasound-guided biopsy x1 I have  discussed the findings and recommendations with the patient. The biopsy procedure was discussed with the patient and questions were answered. Patient expressed their understanding of the biopsy recommendation. Patient will be scheduled for biopsy today. Ordering provider will be notified. If applicable, a reminder letter will be sent to the patient regarding the next appointment. BI-RADS CATEGORY  4: Suspicious. Electronically Signed   By: Corean Salter M.D.   On: 08/11/2024 10:47   MM 3D SCREENING MAMMOGRAM BILATERAL BREAST Result Date: 08/09/2024 CLINICAL DATA:  Screening. EXAM: DIGITAL SCREENING BILATERAL MAMMOGRAM WITH TOMOSYNTHESIS AND CAD TECHNIQUE: Bilateral screening digital craniocaudal and mediolateral oblique mammograms were obtained. Bilateral screening digital breast tomosynthesis was performed. The images were evaluated with computer-aided detection. COMPARISON:  Previous exam(s). ACR Breast Density Category b: There are scattered areas of fibroglandular density. FINDINGS: In the  left breast, a possible focal asymmetry warrants further evaluation. In the right breast, no findings suspicious for malignancy. IMPRESSION: Further evaluation is suggested for possible focal asymmetry in the left breast. RECOMMENDATION: Diagnostic mammogram and possibly ultrasound of the left breast. (Code:[id number]) The patient will be contacted regarding the findings, and additional imaging will be scheduled. BI-RADS CATEGORY  0: Incomplete: Need additional imaging evaluation. Electronically Signed   By: Corean Salter M.D.   On: 08/09/2024 08:40    ASSESSMENT & PLAN:   Malignant neoplasm of upper-outer quadrant of left breast in female, estrogen receptor positive (HCC) # NOV 2025- 6 mm mass  at 2. No suspicious LEFT axillary adenopathy. 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 LEFT BREAST clinical stage I- Dr.Piscoya.   # I  had a long discussion with the patient in general regarding the treatment options of breast cancer including-surgery; adjuvant radiation; role of adjuvant systemic therapy including-chemotherapy antihormone therapy.    # Discussed surgical options-sentinel lymph node biopsy with lumpectomy.   Discussed that lumpectomy is usually followed by adjuvant radiation.  However-recommend further evaluation with Dr. Camelia.  # Discussed that given patient's presurgical clinical characteristics LESS likely that she will need chemotherapy.  However final decision regarding chemotherapy based on final surgical pathology/gene assay.  Will order Oncotype after surgery.  Also of note patient multiple medical problems-are a relative contraindication for chemotherapy.  Which is again less likely that she will need chemotherapy.  #I discussed the role of endocrine therapy-given ER/PR positive disease postsurgery.  Patient will be offered antihormone pill 1 a day for 5 years.  Discussed potential downsides including but not limited to arthralgias hot flashes and osteoporosis. BMD. Will order at next visit.   # DM hb A1 C-metformin -A1c-6.5.  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 Gollan Dr.Gonzalez- on Home O2 1-2 lits/   # Genetics counseling: I at length discussed with the patient that majority of cancers are sporadic however 10 to 20% at risk of genetic/hereditary cancer syndromes.  Discussed importance of genetic counseling/genetic testing-given the therapeutic/preventative aspects.  Patient has significant family history of mother having ovarian cancer 2 sisters with breast cancer and brother with colon cancer.  Patient interested in genetic counseling.  Referral to genetic counselling re: cancer.   Thank you Dr.Bair  MD for allowing me to participate in the care of your pleasant patient. Please do not hesitate to contact me with questions or concerns in the interim. Follow up in 2-3 weeks after  surgery. Discussed with Therisa; and also discussed with surgery, Dr.Piscoya.   # DISPOSITION: # No labs today # genetics referral re: breast cancer ASAP # Follow up TBD- Dr.B  All questions were answered. The patient/family knows to call the clinic with any problems, questions or concerns.     Cindy JONELLE Joe, MD 08/17/2024 12:03 PM

## 2024-08-17 NOTE — Progress Notes (Signed)
 Accompanied patient and family to initial medical oncology appointment.   Reviewed Breast Cancer treatment handbook.   Care plan summary given to patient.   Reviewed outreach programs and cancer center services.

## 2024-08-17 NOTE — Assessment & Plan Note (Addendum)
#   NOV 2025- 6 mm mass  at 2. No suspicious LEFT axillary adenopathy. 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 LEFT BREAST clinical stage I- Dr.Piscoya.   # I had a long discussion with the patient in general regarding the treatment options of breast cancer including-surgery; adjuvant radiation; role of adjuvant systemic therapy including-chemotherapy antihormone therapy.    # Discussed surgical options-sentinel lymph node biopsy with lumpectomy.   Discussed that lumpectomy is usually followed by adjuvant radiation.  However-recommend further evaluation with Dr. Camelia.  # Discussed that given patient's presurgical clinical characteristics LESS likely that she will need chemotherapy.  However final decision regarding chemotherapy based on final surgical pathology/gene assay.  Will order Oncotype after surgery.  Also of note patient multiple medical problems-are a relative contraindication for chemotherapy.  Which is again less likely that she will need chemotherapy.  #I discussed the role of endocrine therapy-given ER/PR positive disease postsurgery.  Patient will be offered antihormone pill 1 a day for 5 years.  Discussed potential downsides including but not limited to arthralgias hot flashes and osteoporosis. BMD. Will order at next visit.   # DM hb A1 C-metformin -A1c-6.5.  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 Gollan Dr.Gonzalez- on Home O2 1-2 lits/   # Genetics counseling: I at length discussed with the patient that majority of cancers are sporadic however 10 to 20% at risk of genetic/hereditary cancer syndromes.  Discussed importance of genetic counseling/genetic testing-given the therapeutic/preventative aspects.  Patient has significant family history of mother having ovarian cancer 2 sisters with breast cancer and brother with colon cancer.  Patient interested in genetic counseling.  Referral  to genetic counselling re: cancer.   Thank you Dr.Bair  MD for allowing me to participate in the care of your pleasant patient. Please do not hesitate to contact me with questions or concerns in the interim. Follow up in 2-3 weeks after surgery. Discussed with Therisa; and also discussed with surgery, Dr.Piscoya.   # DISPOSITION: # No labs today # genetics referral re: breast cancer ASAP # Follow up TBD- Dr.B

## 2024-08-18 ENCOUNTER — Inpatient Hospital Stay

## 2024-08-18 ENCOUNTER — Other Ambulatory Visit: Payer: Self-pay | Admitting: Licensed Clinical Social Worker

## 2024-08-18 ENCOUNTER — Encounter: Payer: Self-pay | Admitting: Licensed Clinical Social Worker

## 2024-08-18 ENCOUNTER — Inpatient Hospital Stay: Admitting: Licensed Clinical Social Worker

## 2024-08-18 DIAGNOSIS — Z1379 Encounter for other screening for genetic and chromosomal anomalies: Secondary | ICD-10-CM

## 2024-08-18 DIAGNOSIS — Z8041 Family history of malignant neoplasm of ovary: Secondary | ICD-10-CM

## 2024-08-18 DIAGNOSIS — Z803 Family history of malignant neoplasm of breast: Secondary | ICD-10-CM

## 2024-08-18 DIAGNOSIS — Z800 Family history of malignant neoplasm of digestive organs: Secondary | ICD-10-CM

## 2024-08-18 DIAGNOSIS — C50412 Malignant neoplasm of upper-outer quadrant of left female breast: Secondary | ICD-10-CM

## 2024-08-18 DIAGNOSIS — Z8049 Family history of malignant neoplasm of other genital organs: Secondary | ICD-10-CM

## 2024-08-18 NOTE — Progress Notes (Signed)
 REFERRING PROVIDER: Rennie Cindy SAUNDERS, MD [address] [place],  [place] [address]  PRIMARY PROVIDER:  Abbey Bruckner, MD  PRIMARY REASON FOR VISIT:  1. Malignant neoplasm of upper-outer quadrant of left breast in female, estrogen receptor positive (HCC)   2. Family history of breast cancer   3. Family history of ovarian cancer   4. Family history of colon cancer      HISTORY OF PRESENT ILLNESS:   Ms. Troost, a 58 y.o. female, was seen for a [place] cancer genetics consultation at the request of Dr. Rennie due to a personal and family history of breast cancer.  Ms. Worden presents to clinic today to discuss the possibility of a hereditary predisposition to cancer, genetic testing, and to further clarify her future cancer risks, as well as potential cancer risks for family members.   CANCER HISTORY:  Oncology History Overview Note  1. Breast, left, needle core biopsy, 2:00 15cmfn 5mm (coil clip) :       INVASIVE MAMMARY CARCINOMA       MAMMARY CARCINOMA IN SITU, SOLID INTERMEDIATE NUCLEAR GRADE       TUBULE FORMATION: SCORE 3       NUCLEAR PLEOMORPHISM: SCORE 2       MITOTIC COUNT: SCORE 1       TOTAL SCORE: 6       OVERALL GRADE: 2       LYMPHOVASCULAR INVASION: NOT IDENTIFIED   ER-100%; PR 100% HER2 negative Ki-67 5%   Malignant neoplasm of upper-outer quadrant of left breast in female, estrogen receptor positive (HCC)  08/17/2024 Initial Diagnosis   Malignant neoplasm of upper-outer quadrant of left breast in female, estrogen receptor positive (HCC)   08/17/2024 Cancer Staging   Staging form: Breast, AJCC 8th Edition - Clinical: Stage IA (cT1b, cN0, cM0, G2, ER+, PR+, HER2-) - Signed by Brahmanday, Govinda R, MD on 08/17/2024 Histologic grading system: 3 grade system    In 2025, Ms. Hartshorne was diagnosed with left breast cancer, ER/PR+ HER2-. She has an appointment with a surgeon, Dr. Desiderio on 12/1.   RELEVANT MEDICAL HISTORY:  Ovaries intact: yes.   Hysterectomy: no.  Menopausal status: postmenopausal.  Colonoscopy: yes; normal. Number of breast biopsies: 1 previous with Dr. Dessa.   Past Medical History:  Diagnosis Date   CHF (congestive heart failure) (HCC)    Colon polyp 01-01-2016   COPD (chronic obstructive pulmonary disease) (HCC)    Diabetes mellitus without complication (HCC)    Diastolic heart failure (HCC)    HLD (hyperlipidemia)    Hypertension    Morbid obesity with BMI of 45.0-49.9, adult (HCC)    Multiple lung nodules on CT    Shortness of breath dyspnea     Past Surgical History:  Procedure Laterality Date   BIOPSY THYROID  Right 02/06/2014   NEGATIVE FOR MALIGNANT CELLS proteinaceous material and macrophages.   BREAST BIOPSY Left    neg- core   BREAST BIOPSY Left 08/11/2024   US  LT BREAST BX W LOC DEV 1ST LESION IMG BX SPEC US  GUIDE 08/11/2024 [facility]   COLONOSCOPY WITH PROPOFOL  N/A 01/01/2016   Procedure: COLONOSCOPY WITH PROPOFOL ;  Surgeon: Rogelia Copping, MD;  Location: [facility] ENDOSCOPY;  Service: Endoscopy;  Laterality: N/A;   RECONSTRUCTION OF EYELID  06/16/2024   thryoid fna Right 12/2013   Proteinaceous material and macrophages.   WISDOM TOOTH EXTRACTION      FAMILY HISTORY:  We obtained a detailed, 4-generation family history.  Significant diagnoses are listed below:  Family History  Problem Relation Age of Onset   Cancer Mother 18       uterian and ovarian    Goiter Mother    Stroke Father    Breast cancer Sister 26   Breast cancer Sister 62   Heart murmur Sister    Stroke Brother    Colon cancer Brother    Ms. Merendino has 4 sisters and 2 brothers. One sister had breast cancer in her 29s and is living at 27. Another sister had breast cancer in her 59s and is living at 58. A brother was recently diagnosed with colon cancer around age 60. Ms. Pevey mother was diagnosed with uterine and ovarian cancer in her 72s and passed of it. No other known cancers in the family.   Ms. Palacios  is unaware of previous family history of genetic testing for hereditary cancer risks. There is no reported Ashkenazi Jewish ancestry. There is no known consanguinity.  GENETIC COUNSELING ASSESSMENT: Ms. Lair is a 58 y.o. female with a personal and family history of breast cancer which is somewhat suggestive of a hereditary cancer syndrome and predisposition to cancer. We, therefore, discussed and recommended the following at today's visit.   DISCUSSION: We discussed that, in general, most cancer is not inherited in families, but instead is sporadic or familial. Sporadic cancers occur by chance and typically happen at older ages (>50 years). We discussed that approximately 10% of breast cancer is hereditary. Most cases of hereditary breast/ovarian cancer are associated with BRCA1/BRCA2 genes, although there are other genes associated with hereditary cancer as well. Cancers and risks are gene specific. We discussed that testing is beneficial for several reasons including knowing about cancer risks, identifying potential screening and risk-reduction options that may be appropriate, and to understand if other family members could be at risk for cancer and allow them to undergo genetic testing.   We reviewed the characteristics, features and inheritance patterns of hereditary cancer syndromes. We also discussed genetic testing, including the appropriate family members to test, the process of testing, insurance coverage and turn-around-time for results. We discussed the implications of a negative, positive and/or variant of uncertain significant result. We recommended Ms. Werst pursue genetic testing for the [facility] Breast Cancer STAT Panel + Common Hereditary Cancers+RNA gene panel.   The STAT Breast cancer panel offered by [facility] includes sequencing and rearrangement analysis for the following 9 genes:  ATM, BRCA1, BRCA2, CDH1, CHEK2, PALB2, PTEN, STK11 and TP53.     The Common Hereditary Cancers Panel +  RNA offered by [facility] includes sequencing and/or deletion duplication testing of the following 48 genes: APC*, ATM*, AXIN2, BAP1, BARD1, BMPR1A, BRCA1, BRCA2, BRIP1, CDH1, CDK4, CDKN2A (p14ARF), CDKN2A (p16INK4a), CHEK2, CTNNA1, DICER1*, EPCAM*, FH*, GREM1*, HOXB13, KIT, MBD4, MEN1*, MLH1*, MSH2*, MSH3*, MSH6*, MUTYH, NF1*, NTHL1, PALB2, PDGFRA, PMS2*, POLD1*, POLE, PTEN*, RAD51C, RAD51D, SDHA*, SDHB, SDHC*, SDHD, SMAD4, SMARCA4, STK11, TP53, TSC1*, TSC2, VHL.    Based on Ms. Crumpler's personal and family history of cancer, she meets medical criteria for genetic testing. Despite that she meets criteria, she may still have an out of pocket cost.   PLAN: After considering the risks, benefits, and limitations, Ms. Westgate provided informed consent to pursue genetic testing and the blood sample was sent to [facility] for analysis of the Breast Cancer STAT panel + Common Hereditary Cancers+RNA Panel. Results should be available within approximately 1 weeks' time, at which point they will be disclosed by telephone to Ms. Leath, as will any additional  recommendations warranted by these results. Ms. Kosar will receive a summary of her genetic counseling visit and a copy of her results once available. This information will also be available in Epic.   Ms. Rebuck questions were answered to her satisfaction today. Our contact information was provided should additional questions or concerns arise. Thank you for the referral and allowing us  to share in the care of your patient.   Dena Cary, MS, [facility] Genetic Counselor [email or web address] Phone: [phone]  I personally spent a total of 40 minutes in the care of the patient today including counseling and educating, placing orders, and documenting clinical information in the EHR.  Dr. Delinda was available for discussion regarding this case.   _______________________________________________________________________ For  Office Staff:  Number of people involved in session: 1 Was an Intern/ student involved with case: no

## 2024-08-19 ENCOUNTER — Telehealth: Payer: Self-pay | Admitting: *Deleted

## 2024-08-19 ENCOUNTER — Ambulatory Visit: Payer: Medicare Other | Admitting: *Deleted

## 2024-08-19 VITALS — Ht 66.0 in | Wt 241.0 lb

## 2024-08-19 DIAGNOSIS — Z0000 Encounter for general adult medical examination without abnormal findings: Secondary | ICD-10-CM

## 2024-08-19 DIAGNOSIS — Z23 Encounter for immunization: Secondary | ICD-10-CM | POA: Insufficient documentation

## 2024-08-19 MED ORDER — TETANUS-DIPHTH-ACELL PERTUSSIS 5-2-15.5 LF-MCG/0.5 IM SUSP: 0.5000 mL | Freq: Once | INTRAMUSCULAR | 0 refills | Status: AC

## 2024-08-19 MED ORDER — TETANUS-DIPHTH-ACELL PERTUSSIS 5-2-15.5 LF-MCG/0.5 IM SUSP: 0.5000 mL | Freq: Once | INTRAMUSCULAR | 0 refills | Status: DC

## 2024-08-19 NOTE — Telephone Encounter (Signed)
 Performed AWV  Patient stated she went to [facility] to get her tetanus vaccine after seeing you. Patient stated  she was told that they need for you to send in a script to them for the vaccine. Patient requested a call back when the script has been sent in. Pharmacy [facility]

## 2024-08-19 NOTE — Telephone Encounter (Signed)
 1. Need for tetanus booster (Primary) - Tdap (ADACEL) 01-28-14.5 LF-MCG/0.5 injection; Inject 0.5 mLs into the muscle once for 1 dose.  Dispense: 0.5 mL; Refill: 0 - Talked to the pharmacy to confirm they received the order.  - Please let the patient know she can call the pharmacy and update her tetanus booster.   Thank you,  Luke Shade, MD

## 2024-08-19 NOTE — Patient Instructions (Signed)
 Martha Woodard,  Thank you for taking the time for your Medicare Wellness Visit. I appreciate your continued commitment to your health goals. Please review the care plan we discussed, and feel free to reach out if I can assist you further.  Please note that Annual Wellness Visits do not include a physical exam. Some assessments may be limited, especially if the visit was conducted virtually. If needed, we may recommend an in-person follow-up with your provider.  Ongoing Care Seeing your primary care provider every 3 to 6 months helps us  monitor your health and provide consistent, personalized care.  Remember to get your tetanus vaccine and call to schedule your lung cancer screening.   Referrals If a referral was made during today's visit and you haven't received any updates within two weeks, please contact the referred provider directly to check on the status.  Recommended Screenings:  Health Maintenance  Topic Date Due   DTaP/Tdap/Td vaccine (1 - Tdap) Never done   Hepatitis B Vaccine (1 of 3 - 19+ 3-dose series) Never done   Zoster (Shingles) Vaccine (1 of 2) Never done   COVID-19 Vaccine (3 - Moderna risk series) 12/03/2020   Screening for Lung Cancer  07/17/2023   Hemoglobin A1C  01/04/2025   Eye exam for diabetics  01/27/2025   Yearly kidney function blood test for diabetes  07/06/2025   Yearly kidney health urinalysis for diabetes  07/06/2025   Complete foot exam   07/06/2025   Breast Cancer Screening  08/05/2025   Medicare Annual Wellness Visit  08/19/2025   Colon Cancer Screening  12/31/2025   Pap with HPV screening  02/05/2026   Pneumococcal Vaccine for age over 68  Completed   Flu Shot  Completed   Hepatitis C Screening  Completed   HIV Screening  Completed   HPV Vaccine  Aged Out   Meningitis B Vaccine  Aged Out       08/19/2024   10:15 AM  Advanced Directives  Does Patient Have a Medical Advance Directive? No  Would patient like information on creating a medical  advance directive? No - Patient declined    Vision: Annual vision screenings are recommended for early detection of glaucoma, cataracts, and diabetic retinopathy. These exams can also reveal signs of chronic conditions such as diabetes and high blood pressure.  Dental: Annual dental screenings help detect early signs of oral cancer, gum disease, and other conditions linked to overall health, including heart disease and diabetes.  Please see the attached documents for additional preventive care recommendations.

## 2024-08-19 NOTE — Telephone Encounter (Signed)
 Patient was notified and verbalized understanding.

## 2024-08-19 NOTE — Progress Notes (Signed)
 Chief Complaint  Patient presents with   Medicare Wellness     Subjective:   Martha Woodard is a 58 y.o. female who presents for a Medicare Annual Wellness Visit.  Allergies (verified) Amoxicillin -pot clavulanate   History: Past Medical History:  Diagnosis Date   CHF (congestive heart failure) (HCC)    Colon polyp 01-01-2016   COPD (chronic obstructive pulmonary disease) (HCC)    Diabetes mellitus without complication (HCC)    Diastolic heart failure (HCC)    HLD (hyperlipidemia)    Hypertension    Morbid obesity with BMI of 45.0-49.9, adult (HCC)    Multiple lung nodules on CT    Shortness of breath dyspnea    Past Surgical History:  Procedure Laterality Date   BIOPSY THYROID  Right 02/06/2014   NEGATIVE FOR MALIGNANT CELLS proteinaceous material and macrophages.   BREAST BIOPSY Left    neg- core   BREAST BIOPSY Left 08/11/2024   US  LT BREAST BX W LOC DEV 1ST LESION IMG BX SPEC US  GUIDE 08/11/2024 [facility]   COLONOSCOPY WITH PROPOFOL  N/A 01/01/2016   Procedure: COLONOSCOPY WITH PROPOFOL ;  Surgeon: Rogelia Copping, MD;  Location: [facility];  Service: Endoscopy;  Laterality: N/A;   RECONSTRUCTION OF EYELID  06/16/2024   thryoid fna Right 12/2013   Proteinaceous material and macrophages.   WISDOM TOOTH EXTRACTION     Family History  Problem Relation Age of Onset   Ovarian cancer Mother 61   Goiter Mother    Uterine cancer Mother 38   Stroke Father    Breast cancer Sister 107   Breast cancer Sister 61   Stroke Brother    Colon cancer Brother        dx 40s   Social History   Occupational History   Not on file  Tobacco Use   Smoking status: Former    Current packs/day: 0.00    Average packs/day: 3.0 packs/day for 34.0 years (102.0 ttl pk-yrs)    Types: Cigarettes    Start date: 07/11/1979    Quit date: 07/10/2013    Years since quitting: 11.1   Smokeless tobacco: Never  Vaping Use   Vaping status: Former  Substance and Sexual Activity   Alcohol  use: No    Alcohol/week: 0.0 standard drinks of alcohol   Drug use: No   Sexual activity: Not Currently   Tobacco Counseling Counseling given: Not Answered  SDOH Screenings   Food Insecurity: No Food Insecurity (08/19/2024)  Housing: Low Risk  (08/19/2024)  Transportation Needs: No Transportation Needs (08/19/2024)  Utilities: Not At Risk (08/19/2024)  Alcohol Screen: Low Risk  (08/19/2024)  Depression (PHQ2-9): Low Risk  (08/19/2024)  Recent Concern: Depression (PHQ2-9) - Medium Risk (07/06/2024)  Financial Resource Strain: Low Risk  (08/19/2024)  Physical Activity: Inactive (08/19/2024)  Social Connections: Moderately Isolated (08/19/2024)  Stress: Stress Concern Present (08/19/2024)  Tobacco Use: Medium Risk (08/19/2024)  Health Literacy: Adequate Health Literacy (08/19/2024)   See flowsheets for full screening details  Depression Screen PHQ 2 & 9 Depression Scale- Over the past 2 weeks, how often have you been bothered by any of the following problems? Little interest or pleasure in doing things: 0 Feeling down, depressed, or hopeless (PHQ Adolescent also includes...irritable): 0 PHQ-2 Total Score: 0 Trouble falling or staying asleep, or sleeping too much: 2 Feeling tired or having little energy: 1 Poor appetite or overeating (PHQ Adolescent also includes...weight loss): 1 Feeling bad about yourself - or that you are a failure or have let  yourself or your family down: 0 Trouble concentrating on things, such as reading the newspaper or watching television (PHQ Adolescent also includes...like school work): 0 Moving or speaking so slowly that other people could have noticed. Or the opposite - being so fidgety or restless that you have been moving around a lot more than usual: 0 Thoughts that you would be better off dead, or of hurting yourself in some way: 0 PHQ-9 Total Score: 4 If you checked off any problems, how difficult have these problems made it for you to do your work,  take care of things at home, or get along with other people?: Somewhat difficult     Goals Addressed             This Visit's Progress    Patient Stated       Wants to continue to lose weight       Visit info / Clinical Intake: Medicare Wellness Visit Type:: Subsequent Annual Wellness Visit Persons participating in visit:: patient Medicare Wellness Visit Mode:: Telephone If telephone:: video declined Because this visit was a virtual/telehealth visit:: pt reported vitals If Telephone or Video please confirm:: I connected with the patient using audio enabled telemedicine application and verified that I am speaking with the correct person using two identifiers; I discussed the limitations of evaluation and management by telemedicine; The patient expressed understanding and agreed to proceed Patient Location:: Home Provider Location:: Office/Home Information given by:: patient Interpreter Needed?: No Pre-visit prep was completed: yes AWV questionnaire completed by patient prior to visit?: no Living arrangements:: (!) lives alone Patient's Overall Health Status Rating: good Typical amount of pain: (!) a lot Does pain affect daily life?: (!) yes Are you currently prescribed opioids?: no  Dietary Habits and Nutritional Risks How many meals a day?: 3 Most meals are obtained by: preparing own meals In the last 2 weeks, have you had any of the following?: (!) nausea, vomiting, diarrhea (has occassionally and has medication to take) Diabetic:: (!) yes Any non-healing wounds?: no How often do you check your BS?: 1 Would you like to be referred to a Nutritionist or for Diabetic Management? : no  Functional Status Activities of Daily Living (to include ambulation/medication): Independent Ambulation: Independent Medication Administration: Independent Home Management: Independent Manage your own finances?: yes Primary transportation is: driving Concerns about vision?: no *vision  screening is required for WTM* Concerns about hearing?: no  Fall Screening Falls in the past year?: 0 Number of falls in past year: 0 Was there an injury with Fall?: 0 Fall Risk Category Calculator: 0 Patient Fall Risk Level: Low Fall Risk  Fall Risk Patient at Risk for Falls Due to: No Fall Risks Fall risk Follow up: Falls evaluation completed; Falls prevention discussed  Home and Transportation Safety: All rugs have non-skid backing?: N/A, no rugs All stairs or steps have railings?: yes Grab bars in the bathtub or shower?: yes Have non-skid surface in bathtub or shower?: yes Good home lighting?: yes Regular seat belt use?: yes Hospital stays in the last year:: no  Cognitive Assessment Difficulty concentrating, remembering, or making decisions? : no Will 6CIT or Mini Cog be Completed: yes What year is it?: 0 points What month is it?: 0 points Give patient an address phrase to remember (5 components): [address] [place] what time is it?: 0 points Count backwards from 20 to 1: 0 points Say the months of the year in reverse: 2 points Repeat the address phrase from earlier: 0  points 6 CIT Score: 2 points  Advance Directives (For Healthcare) Does Patient Have a Medical Advance Directive?: No Would patient like information on creating a medical advance directive?: No - Patient declined  Reviewed/Updated  Reviewed/Updated: Reviewed All (Medical, Surgical, Family, Medications, Allergies, Care Teams, Patient Goals)        Objective:    Today's Vitals   08/19/24 1009  Weight: 241 lb (109.3 kg)  Height: 5' 6 (1.676 m)   Body mass index is 38.9 kg/m.  Current Medications (verified) Outpatient Encounter Medications as of 08/19/2024  Medication Sig   Accu-Chek Softclix Lancets lancets TEST UP TO 4 TIMES DAILY   acetaminophen  (TYLENOL ) 500 MG tablet Take 500 mg by mouth every 6 (six) hours as needed.   albuterol  (PROVENTIL ) (2.5 MG/3ML) 0.083% nebulizer  solution Take 3 mLs (2.5 mg total) by nebulization every 6 (six) hours as needed for wheezing or shortness of breath.   albuterol  (VENTOLIN  HFA) 108 (90 Base) MCG/ACT inhaler Inhale 2 puffs into the lungs every 6 (six) hours as needed for wheezing or shortness of breath.   aspirin  EC 81 MG tablet Take 1 tablet (81 mg total) by mouth daily.   budesonide  (PULMICORT ) 0.5 MG/2ML nebulizer solution Use 1 vial twice a daily as directed.   calcium -vitamin D  (OSCAL WITH D) 500-200 MG-UNIT tablet Take 1 tablet by mouth daily with breakfast.   cetirizine (ZYRTEC) 10 MG tablet Take 10 mg by mouth daily.   ezetimibe  (ZETIA ) 10 MG tablet Take 1 tablet (10 mg total) by mouth daily.   fluticasone  (FLONASE ) 50 MCG/ACT nasal spray USE 1 OR 2 SPRAYS IN EACH NOSTRIL ONCE DAILY.   furosemide  (LASIX ) 20 MG tablet TAKE 1 TABLET BY MOUTH DAILY   gabapentin  (NEURONTIN ) 100 MG capsule Take 1 capsule (100 mg total) by mouth 2 (two) times daily.   glucose blood (ACCU-CHEK AVIVA PLUS) test strip TEST UP TO 4 TIMES DAILY AS DIRECTED   Glycopyrrolate-Formoterol  (BEVESPI  AEROSPHERE) 9-4.8 MCG/ACT AERO Inhale 2 puffs into the lungs 2 (two) times daily.   hydrochlorothiazide  (MICROZIDE ) 12.5 MG capsule TAKE 1 CAPSULE BY MOUTH DAILY   losartan  (COZAAR ) 25 MG tablet TAKE 1 TABLET BY MOUTH TWICE  DAILY   magnesium oxide (MAG-OX) 400 MG tablet Take 400 mg by mouth daily.   metFORMIN  (GLUCOPHAGE ) 500 MG tablet Take 2 tablets (1,000 mg total) by mouth 2 (two) times daily with a meal.   metoprolol  succinate (TOPROL -XL) 25 MG 24 hr tablet TAKE 1 TABLET BY MOUTH DAILY   montelukast  (SINGULAIR ) 10 MG tablet Take 1 tablet (10 mg total) by mouth at bedtime.   Multiple Vitamin (MULTIVITAMIN) tablet Take 1 tablet by mouth daily.   nystatin  (MYCOSTATIN ) 100000 UNIT/ML suspension Take 5 mLs (500,000 Units total) by mouth 4 (four) times daily. (Patient taking differently: Take 5 mLs by mouth 4 (four) times daily as needed.)   ondansetron   (ZOFRAN ) 4 MG tablet Take 1 tablet (4 mg total) by mouth daily as needed for nausea or vomiting.   potassium chloride  (KLOR-CON ) 10 MEQ tablet TAKE 1 TABLET BY MOUTH TWICE  DAILY   PROVENTIL  HFA 108 (90 Base) MCG/ACT inhaler Inhale 2 puffs into the lungs every 6 (six) hours as needed for wheezing or shortness of breath. 18 gram inhaler   rosuvastatin  (CRESTOR ) 5 MG tablet Take 1 tablet (5 mg total) by mouth daily.   sodium chloride  (OCEAN) 0.65 % SOLN nasal spray Place 2 sprays into both nostrils as needed for congestion.   vitamin  C (ASCORBIC ACID) 500 MG tablet Take 1,000 mg by mouth daily.    [DISCONTINUED] acetaminophen  (TYLENOL ) 650 MG CR tablet Take 650 mg by mouth every 8 (eight) hours as needed for pain.   No facility-administered encounter medications on file as of 08/19/2024.   Hearing/Vision screen Hearing Screening - Comments:: No issues Vision Screening - Comments:: Glasses, [facility], up to date Immunizations and Health Maintenance Health Maintenance  Topic Date Due   DTaP/Tdap/Td (1 - Tdap) Never done   Hepatitis B Vaccines 19-59 Average Risk (1 of 3 - 19+ 3-dose series) Never done   Zoster Vaccines- Shingrix (1 of 2) Never done   COVID-19 Vaccine (3 - Moderna risk series) 12/03/2020   Lung Cancer Screening  07/17/2023   HEMOGLOBIN A1C  01/04/2025   OPHTHALMOLOGY EXAM  01/27/2025   Diabetic kidney evaluation - eGFR measurement  07/06/2025   Diabetic kidney evaluation - Urine ACR  07/06/2025   FOOT EXAM  07/06/2025   Mammogram  08/05/2025   Medicare Annual Wellness (AWV)  08/19/2025   Colonoscopy  12/31/2025   Cervical Cancer Screening (HPV/Pap Cotest)  02/05/2026   Pneumococcal Vaccine: 50+ Years  Completed   Influenza Vaccine  Completed   Hepatitis C Screening  Completed   HIV Screening  Completed   HPV VACCINES  Aged Out   Meningococcal B Vaccine  Aged Out        Assessment/Plan:  This is a routine wellness examination for Martha Woodard.  Patient Care Team: Bair,  Kalpana, MD as PCP - General (Family Medicine) Perla, Evalene PARAS, MD as Consulting Physician (Cardiology) Tamea Dedra CROME, MD as Consulting Physician (Pulmonary Disease) Mevelyn Charleston, OD as Referring Physician (Optometry) Georgina Shasta POUR, RN as Oncology Nurse Navigator Rennie Cindy SAUNDERS, MD as Consulting Physician (Oncology)  I have personally reviewed and noted the following in the patient's chart:   Medical and social history Use of alcohol, tobacco or illicit drugs  Current medications and supplements including opioid prescriptions. Functional ability and status Nutritional status Physical activity Advanced directives List of other physicians Hospitalizations, surgeries, and ER visits in previous 12 months Vitals Screenings to include cognitive, depression, and falls Referrals and appointments  No orders of the defined types were placed in this encounter.  In addition, I have reviewed and discussed with patient certain preventive protocols, quality metrics, and best practice recommendations. A written personalized care plan for preventive services as well as general preventive health recommendations were provided to patient.   Angeline Fredericks, LPN   [id number]/7974   Return in 1 year (on 08/19/2025).  After Visit Summary: (Pick Up) Due to this being a telephonic visit, with patients personalized plan was offered to patient and patient has requested to Pick up at office.  Nurse Notes: Patient declines covid, shingles and Hepatitis B vaccines.Phone note sent to PCP regarding tetanus vaccine.  Patient to call and schedule her lung cancer screening telephone number provided.

## 2024-08-23 ENCOUNTER — Telehealth: Payer: Self-pay

## 2024-08-23 DIAGNOSIS — Z1379 Encounter for other screening for genetic and chromosomal anomalies: Secondary | ICD-10-CM | POA: Insufficient documentation

## 2024-08-23 NOTE — Telephone Encounter (Signed)
 Atempt #1 to disclose genetic testing results. Left voicemail and provided callback number.

## 2024-08-24 ENCOUNTER — Telehealth: Payer: Self-pay

## 2024-08-24 ENCOUNTER — Ambulatory Visit

## 2024-08-24 NOTE — Telephone Encounter (Addendum)
 I contacted Ms. Hopfer to discuss her genetic testing results. The test that was ordered was the [facility] Hereditary Breast Cancer STAT Panel. This was ordered on 08/18/2024. No pathogenic variants were identified in the 13 genes analyzed. The remainder of the genetic testing has not been resulted yet. Once it is a detailed clinic note will follow. I have informed her oncology care team of this result.   We discussed that this result does not explain why she developed breast cancer. There is a small chance there was a technical laboratory error or that there is a gene not yet identified that increases risk for breast cancer that was not tested for. It is also possible there could be a hereditary component running through her family that she did not inherit.    The test report has been scanned into EPIC and is located under the Molecular Pathology section of the Results Review tab.  A portion of the result report is included below for reference.      Warren Ahle, MS, [facility] Cancer Genetic Counselor [email or web address] [phone]

## 2024-08-29 ENCOUNTER — Encounter: Payer: Self-pay | Admitting: Surgery

## 2024-08-29 ENCOUNTER — Telehealth: Payer: Self-pay | Admitting: Cardiovascular Disease

## 2024-08-29 ENCOUNTER — Telehealth: Payer: Self-pay | Admitting: Licensed Clinical Social Worker

## 2024-08-29 ENCOUNTER — Ambulatory Visit: Payer: Self-pay | Admitting: Licensed Clinical Social Worker

## 2024-08-29 ENCOUNTER — Encounter: Payer: Self-pay | Admitting: *Deleted

## 2024-08-29 ENCOUNTER — Other Ambulatory Visit: Payer: Self-pay | Admitting: Surgery

## 2024-08-29 ENCOUNTER — Ambulatory Visit: Admitting: Surgery

## 2024-08-29 VITALS — BP 122/75 | HR 86 | Temp 98.1°F | Ht 66.0 in | Wt 240.0 lb

## 2024-08-29 DIAGNOSIS — C50412 Malignant neoplasm of upper-outer quadrant of left female breast: Secondary | ICD-10-CM

## 2024-08-29 DIAGNOSIS — Z170 Estrogen receptor positive status [ER+]: Secondary | ICD-10-CM

## 2024-08-29 DIAGNOSIS — C50919 Malignant neoplasm of unspecified site of unspecified female breast: Secondary | ICD-10-CM

## 2024-08-29 NOTE — Telephone Encounter (Signed)
   Name: Martha Woodard  DOB: Dec 10, 1965  MRN: [id number]  Primary Cardiologist: Perla  Chart reviewed as part of pre-operative protocol coverage. Because of [name]'s past medical history and time since last visit, she will require a follow-up in-office visit ASAP as procedure is on 09/15/2024, in order to better assess preoperative cardiovascular risk. Severe COPD with O2 dependence, diastolic CHF.  Recommend pulmonary clearance as well.   Pre-op covering staff: - Please schedule appointment and call patient to inform them. If patient already had an upcoming appointment within acceptable timeframe, please add pre-op clearance to the appointment notes so provider is aware. - Please contact requesting surgeon's office via preferred method (i.e, phone, fax) to inform them of need for appointment prior to surgery.    Lamarr Satterfield, NP  08/29/2024, 3:24 PM

## 2024-08-29 NOTE — Telephone Encounter (Signed)
 I contacted [name] to discuss her genetic testing results. No pathogenic variants were identified in the 48 genes analyzed. Detailed clinic note to follow.   The test report has been scanned into EPIC and is located under the Molecular Pathology section of the Results Review tab.  A portion of the result report is included below for reference.      Dena Cary, MS, [facility] Genetic Counselor [email or web address] Phone: [phone]

## 2024-08-29 NOTE — Progress Notes (Signed)
 HPI:   Ms. Bulls was previously seen in the [facility] due to a personal and family history of cancer and concerns regarding a hereditary predisposition to cancer. Please refer to our prior cancer genetics clinic note for more information regarding our discussion, assessment and recommendations, at the time. Ms. Hermance recent genetic test results were disclosed to her, as were recommendations warranted by these results. These results and recommendations are discussed in more detail below.  CANCER HISTORY:  Oncology History Overview Note  1. Breast, left, needle core biopsy, 2:00 15cmfn 5mm (coil clip) :       INVASIVE MAMMARY CARCINOMA       MAMMARY CARCINOMA IN SITU, SOLID INTERMEDIATE NUCLEAR GRADE       TUBULE FORMATION: SCORE 3       NUCLEAR PLEOMORPHISM: SCORE 2       MITOTIC COUNT: SCORE 1       TOTAL SCORE: 6       OVERALL GRADE: 2       LYMPHOVASCULAR INVASION: NOT IDENTIFIED   ER-100%; PR 100% HER2 negative Ki-67 5%   Malignant neoplasm of upper-outer quadrant of left breast in female, estrogen receptor positive (HCC)  08/17/2024 Initial Diagnosis   Malignant neoplasm of upper-outer quadrant of left breast in female, estrogen receptor positive (HCC)   08/17/2024 Cancer Staging   Staging form: Breast, AJCC 8th Edition - Clinical: Stage IA (cT1b, cN0, cM0, G2, ER+, PR+, HER2-) - Signed by Rennie Cindy SAUNDERS, MD on 08/17/2024 Histologic grading system: 3 grade system   08/25/2024 Genetic Testing   Negative [facility] Hereditary Breast Cancer stat panel (9 genes) and [facility] Common Hereditary Cancers+RNA Panel. VUS in MUTYH identified. Final report date 08/25/2024.  The STAT Breast cancer panel offered by [facility] includes sequencing and rearrangement analysis for the following 9 genes:  ATM, BRCA1, BRCA2, CDH1, CHEK2, PALB2, PTEN, STK11 and TP53.     The Common Hereditary Cancers Panel + RNA offered by [facility] includes sequencing and/or deletion  duplication testing of the following 48 genes: APC*, ATM*, AXIN2, BAP1, BARD1, BMPR1A, BRCA1, BRCA2, BRIP1, CDH1, CDK4, CDKN2A (p14ARF), CDKN2A (p16INK4a), CHEK2, CTNNA1, DICER1*, EPCAM*, FH*, GREM1*, HOXB13, KIT, MBD4, MEN1*, MLH1*, MSH2*, MSH3*, MSH6*, MUTYH, NF1*, NTHL1, PALB2, PDGFRA, PMS2*, POLD1*, POLE, PTEN*, RAD51C, RAD51D, SDHA*, SDHB, SDHC*, SDHD, SMAD4, SMARCA4, STK11, TP53, TSC1*, TSC2, VHL. '      FAMILY HISTORY:  We obtained a detailed, 4-generation family history.  Significant diagnoses are listed below: Family History  Problem Relation Age of Onset   Ovarian cancer Mother 58   Goiter Mother    Uterine cancer Mother 22   Stroke Father    Breast cancer Sister 17   Breast cancer Sister 85   Stroke Brother    Colon cancer Brother        dx 70s    Ms. Monterrosa has 4 sisters and 2 brothers. One sister had breast cancer in her 35s and is living at 33. Another sister had breast cancer in her 71s and is living at 38. A brother was recently diagnosed with colon cancer around age 15. Ms. Ahart mother was diagnosed with uterine and ovarian cancer in her 48s and passed of it. No other known cancers in the family.    Ms. Daquila is unaware of previous family history of genetic testing for hereditary cancer risks. There is no reported Ashkenazi Jewish ancestry. There is no known consanguinity.  GENETIC TEST RESULTS:  The [facility] Common Hereditary Cancers+RNA Panel found  no pathogenic mutations.   The Common Hereditary Cancers Panel + RNA offered by [facility] includes sequencing and/or deletion duplication testing of the following 48 genes: APC*, ATM*, AXIN2, BAP1, BARD1, BMPR1A, BRCA1, BRCA2, BRIP1, CDH1, CDK4, CDKN2A (p14ARF), CDKN2A (p16INK4a), CHEK2, CTNNA1, DICER1*, EPCAM*, FH*, GREM1*, HOXB13, KIT, MBD4, MEN1*, MLH1*, MSH2*, MSH3*, MSH6*, MUTYH, NF1*, NTHL1, PALB2, PDGFRA, PMS2*, POLD1*, POLE, PTEN*, RAD51C, RAD51D, SDHA*, SDHB, SDHC*, SDHD, SMAD4, SMARCA4, STK11, TP53, TSC1*, TSC2,  VHL.  The test report has been scanned into EPIC and is located under the Molecular Pathology section of the Results Review tab.  A portion of the result report is included below for reference. Genetic testing reported out on 08/25/2024.     Genetic testing identified a variant of uncertain significance (VUS) in the MUTYH gene.  At this time, it is unknown if this variant is associated with an increased risk for cancer or if it is benign, but most uncertain variants are reclassified to benign. It should not be used to make medical management decisions. With time, we suspect the laboratory will determine the significance of this variant, if any. If the laboratory reclassifies this variant, we will attempt to contact Ms. Quizhpi to discuss it further.   Even though a pathogenic variant was not identified, possible explanations for the cancer in the family may include: There may be no hereditary risk for cancer in the family. The cancers in Ms. Heckert and/or her family may be sporadic/familial or due to other genetic and environmental factors. There may be a gene mutation in one of these genes that current testing methods cannot detect but that chance is small. There could be another gene that has not yet been discovered, or that we have not yet tested, that is responsible for the cancer diagnoses in the family.  It is also possible there is a hereditary cause for the cancer in the family that Ms. Knueppel did not inherit.  Therefore, it is important to remain in touch with cancer genetics in the future so that we can continue to offer Ms. Vint the most up to date genetic testing.   ADDITIONAL GENETIC TESTING:  We discussed with Ms. Granillo that her genetic testing was fairly extensive.  If there are additional relevant genes identified to increase cancer risk that can be analyzed in the future, we would be happy to discuss and coordinate this testing at that time.    CANCER SCREENING  RECOMMENDATIONS:  Ms. Mathies test result is considered negative (normal).  This means that we have not identified a hereditary cause for her personal and family history of cancer at this time.   An individual's cancer risk and medical management are not determined by genetic test results alone. Overall cancer risk assessment incorporates additional factors, including personal medical history, family history, and any available genetic information that may result in a personalized plan for cancer prevention and surveillance. Therefore, it is recommended she continue to follow the cancer management and screening guidelines provided by her oncology and primary healthcare provider.  RECOMMENDATIONS FOR FAMILY MEMBERS:   Individuals in this family might be at some increased risk of developing cancer, over the general population risk, due to the family history of cancer.  Individuals in the family should notify their providers of the family history of cancer. We recommend women in this family have a yearly mammogram beginning at age 85, or 62 years younger than the earliest onset of cancer, an annual clinical breast exam, and perform monthly breast self-exams.  Family members should have colonoscopies by at age 17, or earlier, as recommended by their providers Other members of the family may still carry a pathogenic variant in one of these genes that Ms. Mccahill did not inherit. Based on the family history, we recommend her siblings who have had cancer have genetic counseling and testing. Ms. Goswick will let us  know if we can be of any assistance in coordinating genetic counseling and/or testing for this family member.   We do not recommend familial testing for the MUTYH  variant of uncertain significance (VUS).  FOLLOW-UP:  Lastly, we discussed with Ms. Vanpatten that cancer genetics is a rapidly advancing field and it is possible that new genetic tests will be appropriate for her and/or her family members in  the future. We encouraged her to remain in contact with cancer genetics on an annual basis so we can update her personal and family histories and let her know of advances in cancer genetics that may benefit this family.   Our contact number was provided. Ms. Vita questions were answered to her satisfaction, and she knows she is welcome to call us  at anytime with additional questions or concerns.    Dena Cary, MS, [facility] Genetic Counselor [email or web address] Phone: [phone]

## 2024-08-29 NOTE — Progress Notes (Signed)
 Request for Medical Clearance has been faxed to Dr Luke Shade.

## 2024-08-29 NOTE — Telephone Encounter (Signed)
   Pre-operative Risk Assessment    Patient Name: Martha Woodard  DOB: 22-Feb-1966 MRN: [id number]   Date of last office visit: 04/18/24 Date of next office visit: n/a   Request for Surgical Clearance    Procedure:  left breast lumpectomy   Date of Surgery:  Clearance 09/15/24                                Surgeon:  Dr Aloysius Plant Surgeon's Group or Practice Name:  [facility] Phone number:  [phone] Fax number:  [phone]   Type of Clearance Requested:   - Medical    Type of Anesthesia:  General    Additional requests/questions:    SignedRosina Stamps   08/29/2024, 3:03 PM

## 2024-08-29 NOTE — H&P (View-Only) (Signed)
 08/29/2024  Reason for Visit: Left breast cancer  Requesting Provider: Luke Shade, MD  History of Present Illness: Martha Woodard is a 58 y.o. female presenting for evaluation of left breast cancer.  The patient had a mammogram on 08/05/2024 which showed a left breast asymmetry.  This was followed by diagnostic mammogram and ultrasound on 08/11/2024 which showed a 6 x 3 x 3 mm mass in the left breast at 12:00 location, 15 cm from the nipple.  She underwent a biopsy on 08/11/24 which showed invasive mammary carcinoma.  The patient was seen by Dr. Rennie and given her family history of breast cancer, also underwent genetic counseling and testing which did not show any pathogenic variants.  The patient does have a strong history of breast cancer in her family and has 2 sisters that have had breast cancer.  One of them had a mastectomy.  The age of menarche was 60, age of first pregnancy was 37 and she has had only 1 pregnancy.  She has reached menopause and has not had any hormone therapy or birth control.  She had a prior biopsy in April 2017 in the left breast with Dr. Dessa which showed a fibroadenoma.  Of note, she does have a significant medical history for diabetes, severe COPD and uses between 1 to 2 L of oxygen  via nasal cannula, dyspnea on exertion, hypertension, diastolic CHF, hyperlipidemia, and obesity.  Past Medical History: Past Medical History:  Diagnosis Date   CHF (congestive heart failure) (HCC)    Colon polyp 01-01-2016   COPD (chronic obstructive pulmonary disease) (HCC)    Diabetes mellitus without complication (HCC)    Diastolic heart failure (HCC)    HLD (hyperlipidemia)    Hypertension    Morbid obesity with BMI of 45.0-49.9, adult (HCC)    Multiple lung nodules on CT    Shortness of breath dyspnea      Past Surgical History: Past Surgical History:  Procedure Laterality Date   BIOPSY THYROID  Right 02/06/2014   NEGATIVE FOR MALIGNANT CELLS proteinaceous  material and macrophages.   BREAST BIOPSY Left    neg- core   BREAST BIOPSY Left 08/11/2024   US  LT BREAST BX W LOC DEV 1ST LESION IMG BX SPEC US  GUIDE 08/11/2024 [facility]   COLONOSCOPY WITH PROPOFOL  N/A 01/01/2016   Procedure: COLONOSCOPY WITH PROPOFOL ;  Surgeon: Rogelia Copping, MD;  Location: [facility] ENDOSCOPY;  Service: Endoscopy;  Laterality: N/A;   RECONSTRUCTION OF EYELID  06/16/2024   thryoid fna Right 12/2013   Proteinaceous material and macrophages.   WISDOM TOOTH EXTRACTION      Home Medications: Prior to Admission medications   Medication Sig Start Date End Date Taking? Authorizing Provider  Accu-Chek Softclix Lancets lancets TEST UP TO 4 TIMES DAILY 06/11/20  Yes Maribeth Camellia MATSU, MD  acetaminophen  (TYLENOL ) 500 MG tablet Take 500 mg by mouth every 6 (six) hours as needed.   Yes [provider]  albuterol  (PROVENTIL ) (2.5 MG/3ML) 0.083% nebulizer solution Take 3 mLs (2.5 mg total) by nebulization every 6 (six) hours as needed for wheezing or shortness of breath. 03/23/23  Yes Tamea Dedra CROME, MD  albuterol  (VENTOLIN  HFA) 108 (90 Base) MCG/ACT inhaler Inhale 2 puffs into the lungs every 6 (six) hours as needed for wheezing or shortness of breath. 09/15/23  Yes Tamea Dedra CROME, MD  aspirin  EC 81 MG tablet Take 1 tablet (81 mg total) by mouth daily. 02/11/17  Yes Cook, Jayce G, DO  budesonide  (PULMICORT ) 0.5 MG/2ML  nebulizer solution Use 1 vial twice a daily as directed. 11/17/23  Yes Tamea Dedra CROME, MD  calcium -vitamin D  (OSCAL WITH D) 500-200 MG-UNIT tablet Take 1 tablet by mouth daily with breakfast.   Yes [provider]  cetirizine (ZYRTEC) 10 MG tablet Take 10 mg by mouth daily.   Yes [provider]  ezetimibe  (ZETIA ) 10 MG tablet Take 1 tablet (10 mg total) by mouth daily. 05/02/24 08/29/24 Yes Gollan, Timothy J, MD  fluticasone  (FLONASE ) 50 MCG/ACT nasal spray USE 1 OR 2 SPRAYS IN EACH NOSTRIL ONCE DAILY. 07/06/24  Yes Bair, Kalpana, MD   furosemide  (LASIX ) 20 MG tablet TAKE 1 TABLET BY MOUTH DAILY 08/10/24  Yes Gollan, Timothy J, MD  gabapentin  (NEURONTIN ) 100 MG capsule Take 1 capsule (100 mg total) by mouth 2 (two) times daily. 07/06/24  Yes Bair, Kalpana, MD  glucose blood (ACCU-CHEK AVIVA PLUS) test strip TEST UP TO 4 TIMES DAILY AS DIRECTED 07/06/24  Yes Bair, Kalpana, MD  Glycopyrrolate-Formoterol  (BEVESPI  AEROSPHERE) 9-4.8 MCG/ACT AERO Inhale 2 puffs into the lungs 2 (two) times daily. 06/06/24  Yes Tamea Dedra CROME, MD  hydrochlorothiazide  (MICROZIDE ) 12.5 MG capsule TAKE 1 CAPSULE BY MOUTH DAILY 08/10/24  Yes Gollan, Timothy J, MD  losartan  (COZAAR ) 25 MG tablet TAKE 1 TABLET BY MOUTH TWICE  DAILY 08/10/24  Yes Gollan, Timothy J, MD  magnesium oxide (MAG-OX) 400 MG tablet Take 400 mg by mouth daily.   Yes [provider]  metFORMIN  (GLUCOPHAGE ) 500 MG tablet Take 2 tablets (1,000 mg total) by mouth 2 (two) times daily with a meal. 07/06/24  Yes Bair, Luke, MD  metoprolol  succinate (TOPROL -XL) 25 MG 24 hr tablet TAKE 1 TABLET BY MOUTH DAILY 08/10/24  Yes Gollan, Timothy J, MD  montelukast  (SINGULAIR ) 10 MG tablet Take 1 tablet (10 mg total) by mouth at bedtime. 07/06/24  Yes Bair, Kalpana, MD  Multiple Vitamin (MULTIVITAMIN) tablet Take 1 tablet by mouth daily.   Yes [provider]  nystatin  (MYCOSTATIN ) 100000 UNIT/ML suspension Take 5 mLs (500,000 Units total) by mouth 4 (four) times daily. Patient taking differently: Take 5 mLs by mouth 4 (four) times daily as needed. 01/22/24  Yes Hope Merle, MD  ondansetron  (ZOFRAN ) 4 MG tablet Take 1 tablet (4 mg total) by mouth daily as needed for nausea or vomiting. 07/06/24  Yes Bair, Kalpana, MD  OXYGEN  Inhale 1 L/L into the lungs continuous as needed. Uses 1 liter when out of the house and 2 liters at night.   Yes [provider]  potassium chloride  (KLOR-CON ) 10 MEQ tablet TAKE 1 TABLET BY MOUTH TWICE  DAILY 01/11/24  Yes Gollan, Timothy J, MD   PROVENTIL  HFA 108 (90 Base) MCG/ACT inhaler Inhale 2 puffs into the lungs every 6 (six) hours as needed for wheezing or shortness of breath. 18 gram inhaler 09/14/23  Yes Tamea Dedra CROME, MD  rosuvastatin  (CRESTOR ) 5 MG tablet Take 1 tablet (5 mg total) by mouth daily. 05/02/24  Yes Gollan, Timothy J, MD  sodium chloride  (OCEAN) 0.65 % SOLN nasal spray Place 2 sprays into both nostrils as needed for congestion. 10/23/20  Yes McLean-Scocuzza, Randine SAILOR, MD  vitamin C (ASCORBIC ACID) 500 MG tablet Take 1,000 mg by mouth daily.    Yes [provider]    Allergies: Allergies  Allergen Reactions   Amoxicillin -Pot Clavulanate Itching and Hives    Hives  Can take PCN and Amoxicillin  alone    Social History:  reports that she quit smoking  about 11 years ago. Her smoking use included cigarettes. She started smoking about 45 years ago. She has a 102 pack-year smoking history. She has been exposed to tobacco smoke. She has never used smokeless tobacco. She reports that she does not drink alcohol and does not use drugs.   Family History: Family History  Problem Relation Age of Onset   Ovarian cancer Mother 71   Goiter Mother    Uterine cancer Mother 40   Stroke Father    Breast cancer Sister 59   Breast cancer Sister 38   Stroke Brother    Colon cancer Brother        dx 6s    Review of Systems: Review of Systems  Constitutional:  Negative for chills and fever.  HENT:  Negative for hearing loss.   Respiratory:  Negative for shortness of breath.   Cardiovascular:  Negative for chest pain.  Gastrointestinal:  Negative for nausea and vomiting.  Genitourinary:  Negative for dysuria.  Musculoskeletal:  Negative for myalgias.  Skin:  Negative for rash.  Neurological:  Negative for dizziness.  Psychiatric/Behavioral:  Negative for depression.     Physical Exam BP 122/75   Pulse 86   Temp 98.1 F (36.7 C)   Ht 5' 6 (1.676 m)   Wt 240 lb (108.9 kg)   LMP 04/10/2014   SpO2  93%   BMI 38.74 kg/m  CONSTITUTIONAL: No acute distress HEENT:  Normocephalic, atraumatic, extraocular motion intact. NECK: Trachea is midline, and there is no jugular venous distension.  RESPIRATORY:  Lungs are clear, and breath sounds are equal bilaterally. Normal respiratory effort without pathologic use of accessory muscles.  Patient wearing nasal cannula at 1 L, but was able to have it off during our examination. CARDIOVASCULAR: Heart is regular without murmurs, gallops, or rubs. BREAST: Left breast without any palpable masses, skin changes, or nipple changes.  Breast is large and difficult at times to palpate deeper areas.  No left axillary lymphadenopathy.  Right breast without any palpable masses, skin changes, or nipple changes.  Breast is large and difficult at times to palpate deeper areas.  No right axillary lymphadenopathy. MUSCULOSKELETAL:  Normal muscle strength and tone in all four extremities.  No peripheral edema or cyanosis. SKIN: Skin turgor is normal. There are no pathologic skin lesions.  NEUROLOGIC:  Motor and sensation is grossly normal.  Cranial nerves are grossly intact. PSYCH:  Alert and oriented to person, place and time. Affect is normal.  Laboratory Analysis: Labs from 08/06/2024: Sodium 144, potassium 4.2, chloride 100, CO2 33, BUN 14, creatinine 0.59.  Total bilirubin 0.4, AST 56, ALT 61, alkaline phosphatase 50.  Labs from 08/15/2024: WBC 5.5, hemoglobin 14, hematocrit 43.1, platelets 256.  Left breast biopsy on 08/11/2024: FINAL DIAGNOSIS       1. Breast, left, needle core biopsy, 2:00 15cmfn 5mm (coil clip) :       INVASIVE MAMMARY CARCINOMA       MAMMARY CARCINOMA IN SITU, SOLID INTERMEDIATE NUCLEAR GRADE       TUBULE FORMATION: SCORE 3       NUCLEAR PLEOMORPHISM: SCORE 2       MITOTIC COUNT: SCORE 1       TOTAL SCORE: 6       OVERALL GRADE: 2       LYMPHOVASCULAR INVASION: NOT IDENTIFIED       CANCER LENGTH: 4.5 MM / 0.45 CM       CALCIFICATIONS:  PRESENT  OTHER FINDINGS: NONE   Imaging: Mammogram and ultrasound on 08/11/2024: FINDINGS: Spot compression tomosynthesis views confirm a persistent focal asymmetry in the LEFT upper outer breast at middle depth. This is not stable compared to priors.   Targeted ultrasound was performed of the LEFT breast. At 2 o'clock 15 cm from the nipple, there is an irregular hypoechoic mass with an echogenic rind. This measures 6 by 3 by 3 mm. This is favored to correspond to the site of mammographic concern.   Targeted ultrasound was performed LEFT axilla. No suspicious axillary lymph nodes are visualized.   IMPRESSION: 1. There is a moderately suspicious 6 mm mass at the site of screening mammographic concern. Recommend ultrasound-guided biopsy for definitive characterization. 2. No suspicious LEFT axillary adenopathy.   RECOMMENDATION: LEFT breast ultrasound-guided biopsy x1   I have discussed the findings and recommendations with the patient. The biopsy procedure was discussed with the patient and questions were answered. Patient expressed their understanding of the biopsy recommendation. Patient will be scheduled for biopsy today. Ordering provider will be notified. If applicable, a reminder letter will be sent to the patient regarding the next appointment.   BI-RADS CATEGORY  4: Suspicious.  Assessment and Plan: This is a 58 y.o. female with left breast cancer.  - Discussed with patient the findings on her mammogram and ultrasound and biopsy results showing left breast cancer.  Discussed with patient the potential surgical options including lumpectomy versus mastectomy, the need for evaluating the axillary lymph nodes via sentinel lymph node biopsy, recommendation for radiation if doing a lumpectomy versus unlikely to need radiation if doing a mastectomy.  After further discussion, the patient has opted for a lumpectomy. - Discussed the patient and the plan for a left breast tag localized  lumpectomy and left axillary sentinel lymph node biopsy.  Reviewed with her the surgery at length including the preoperative tag localization at the [facility] breast center, planned incisions, risk of bleeding, infection, injury to surrounding structures, the use of radioactive tracer to better evaluate the lymph nodes, the use of methylene blue to better evaluate the lymph nodes, that this would be an outpatient procedure, postoperative activity restrictions, pain control, and she is willing to proceed. - Given her different medical issues, we will send for medical, cardiac, and pulmonary clearance.  Will schedule her tentatively for surgery on 09/15/2024, knowing that this may need to be postponed if any preop testing is required.  If able to keep this date, then we will ask her to hold her aspirin  with the last dose on 09/09/2024. - All of her questions have been answered.  I spent 55 minutes dedicated to the care of this patient on the date of this encounter to include pre-visit review of records, face-to-face time with the patient discussing diagnosis and management, and any post-visit coordination of care.   Aloysius Sheree Plant, MD [facility]

## 2024-08-29 NOTE — Progress Notes (Signed)
 Lumpectomy is scheduled for 12/18.  She will see Dr. Jacobo and Dr. Lenn on 10/10/24.

## 2024-08-29 NOTE — Addendum Note (Signed)
 Addended by: GEORGINA SHASTA POUR on: 08/29/2024 03:26 PM   Modules accepted: Orders

## 2024-08-29 NOTE — Progress Notes (Signed)
 08/29/2024  Reason for Visit: Left breast cancer  Requesting Provider: Luke Shade, MD  History of Present Illness: Martha Woodard is a 58 y.o. female presenting for evaluation of left breast cancer.  The patient had a mammogram on 08/05/2024 which showed a left breast asymmetry.  This was followed by diagnostic mammogram and ultrasound on 08/11/2024 which showed a 6 x 3 x 3 mm mass in the left breast at 12:00 location, 15 cm from the nipple.  She underwent a biopsy on 08/11/24 which showed invasive mammary carcinoma.  The patient was seen by Dr. Rennie and given her family history of breast cancer, also underwent genetic counseling and testing which did not show any pathogenic variants.  The patient does have a strong history of breast cancer in her family and has 2 sisters that have had breast cancer.  One of them had a mastectomy.  The age of menarche was 60, age of first pregnancy was 37 and she has had only 1 pregnancy.  She has reached menopause and has not had any hormone therapy or birth control.  She had a prior biopsy in April 2017 in the left breast with Dr. Dessa which showed a fibroadenoma.  Of note, she does have a significant medical history for diabetes, severe COPD and uses between 1 to 2 L of oxygen  via nasal cannula, dyspnea on exertion, hypertension, diastolic CHF, hyperlipidemia, and obesity.  Past Medical History: Past Medical History:  Diagnosis Date   CHF (congestive heart failure) (HCC)    Colon polyp 01-01-2016   COPD (chronic obstructive pulmonary disease) (HCC)    Diabetes mellitus without complication (HCC)    Diastolic heart failure (HCC)    HLD (hyperlipidemia)    Hypertension    Morbid obesity with BMI of 45.0-49.9, adult (HCC)    Multiple lung nodules on CT    Shortness of breath dyspnea      Past Surgical History: Past Surgical History:  Procedure Laterality Date   BIOPSY THYROID  Right 02/06/2014   NEGATIVE FOR MALIGNANT CELLS proteinaceous  material and macrophages.   BREAST BIOPSY Left    neg- core   BREAST BIOPSY Left 08/11/2024   US  LT BREAST BX W LOC DEV 1ST LESION IMG BX SPEC US  GUIDE 08/11/2024 [facility]   COLONOSCOPY WITH PROPOFOL  N/A 01/01/2016   Procedure: COLONOSCOPY WITH PROPOFOL ;  Surgeon: Rogelia Copping, MD;  Location: [facility] ENDOSCOPY;  Service: Endoscopy;  Laterality: N/A;   RECONSTRUCTION OF EYELID  06/16/2024   thryoid fna Right 12/2013   Proteinaceous material and macrophages.   WISDOM TOOTH EXTRACTION      Home Medications: Prior to Admission medications   Medication Sig Start Date End Date Taking? Authorizing Provider  Accu-Chek Softclix Lancets lancets TEST UP TO 4 TIMES DAILY 06/11/20  Yes Maribeth Camellia MATSU, MD  acetaminophen  (TYLENOL ) 500 MG tablet Take 500 mg by mouth every 6 (six) hours as needed.   Yes [provider]  albuterol  (PROVENTIL ) (2.5 MG/3ML) 0.083% nebulizer solution Take 3 mLs (2.5 mg total) by nebulization every 6 (six) hours as needed for wheezing or shortness of breath. 03/23/23  Yes Tamea Dedra CROME, MD  albuterol  (VENTOLIN  HFA) 108 (90 Base) MCG/ACT inhaler Inhale 2 puffs into the lungs every 6 (six) hours as needed for wheezing or shortness of breath. 09/15/23  Yes Tamea Dedra CROME, MD  aspirin  EC 81 MG tablet Take 1 tablet (81 mg total) by mouth daily. 02/11/17  Yes Cook, Jayce G, DO  budesonide  (PULMICORT ) 0.5 MG/2ML  nebulizer solution Use 1 vial twice a daily as directed. 11/17/23  Yes Tamea Dedra CROME, MD  calcium -vitamin D  (OSCAL WITH D) 500-200 MG-UNIT tablet Take 1 tablet by mouth daily with breakfast.   Yes [provider]  cetirizine (ZYRTEC) 10 MG tablet Take 10 mg by mouth daily.   Yes [provider]  ezetimibe  (ZETIA ) 10 MG tablet Take 1 tablet (10 mg total) by mouth daily. 05/02/24 08/29/24 Yes Gollan, Timothy J, MD  fluticasone  (FLONASE ) 50 MCG/ACT nasal spray USE 1 OR 2 SPRAYS IN EACH NOSTRIL ONCE DAILY. 07/06/24  Yes Bair, Kalpana, MD   furosemide  (LASIX ) 20 MG tablet TAKE 1 TABLET BY MOUTH DAILY 08/10/24  Yes Gollan, Timothy J, MD  gabapentin  (NEURONTIN ) 100 MG capsule Take 1 capsule (100 mg total) by mouth 2 (two) times daily. 07/06/24  Yes Bair, Kalpana, MD  glucose blood (ACCU-CHEK AVIVA PLUS) test strip TEST UP TO 4 TIMES DAILY AS DIRECTED 07/06/24  Yes Bair, Kalpana, MD  Glycopyrrolate-Formoterol  (BEVESPI  AEROSPHERE) 9-4.8 MCG/ACT AERO Inhale 2 puffs into the lungs 2 (two) times daily. 06/06/24  Yes Tamea Dedra CROME, MD  hydrochlorothiazide  (MICROZIDE ) 12.5 MG capsule TAKE 1 CAPSULE BY MOUTH DAILY 08/10/24  Yes Gollan, Timothy J, MD  losartan  (COZAAR ) 25 MG tablet TAKE 1 TABLET BY MOUTH TWICE  DAILY 08/10/24  Yes Gollan, Timothy J, MD  magnesium oxide (MAG-OX) 400 MG tablet Take 400 mg by mouth daily.   Yes [provider]  metFORMIN  (GLUCOPHAGE ) 500 MG tablet Take 2 tablets (1,000 mg total) by mouth 2 (two) times daily with a meal. 07/06/24  Yes Bair, Luke, MD  metoprolol  succinate (TOPROL -XL) 25 MG 24 hr tablet TAKE 1 TABLET BY MOUTH DAILY 08/10/24  Yes Gollan, Timothy J, MD  montelukast  (SINGULAIR ) 10 MG tablet Take 1 tablet (10 mg total) by mouth at bedtime. 07/06/24  Yes Bair, Kalpana, MD  Multiple Vitamin (MULTIVITAMIN) tablet Take 1 tablet by mouth daily.   Yes [provider]  nystatin  (MYCOSTATIN ) 100000 UNIT/ML suspension Take 5 mLs (500,000 Units total) by mouth 4 (four) times daily. Patient taking differently: Take 5 mLs by mouth 4 (four) times daily as needed. 01/22/24  Yes Hope Merle, MD  ondansetron  (ZOFRAN ) 4 MG tablet Take 1 tablet (4 mg total) by mouth daily as needed for nausea or vomiting. 07/06/24  Yes Bair, Kalpana, MD  OXYGEN  Inhale 1 L/L into the lungs continuous as needed. Uses 1 liter when out of the house and 2 liters at night.   Yes [provider]  potassium chloride  (KLOR-CON ) 10 MEQ tablet TAKE 1 TABLET BY MOUTH TWICE  DAILY 01/11/24  Yes Gollan, Timothy J, MD   PROVENTIL  HFA 108 (90 Base) MCG/ACT inhaler Inhale 2 puffs into the lungs every 6 (six) hours as needed for wheezing or shortness of breath. 18 gram inhaler 09/14/23  Yes Tamea Dedra CROME, MD  rosuvastatin  (CRESTOR ) 5 MG tablet Take 1 tablet (5 mg total) by mouth daily. 05/02/24  Yes Gollan, Timothy J, MD  sodium chloride  (OCEAN) 0.65 % SOLN nasal spray Place 2 sprays into both nostrils as needed for congestion. 10/23/20  Yes McLean-Scocuzza, Randine SAILOR, MD  vitamin C (ASCORBIC ACID) 500 MG tablet Take 1,000 mg by mouth daily.    Yes [provider]    Allergies: Allergies  Allergen Reactions   Amoxicillin -Pot Clavulanate Itching and Hives    Hives  Can take PCN and Amoxicillin  alone    Social History:  reports that she quit smoking  about 11 years ago. Her smoking use included cigarettes. She started smoking about 45 years ago. She has a 102 pack-year smoking history. She has been exposed to tobacco smoke. She has never used smokeless tobacco. She reports that she does not drink alcohol and does not use drugs.   Family History: Family History  Problem Relation Age of Onset   Ovarian cancer Mother 71   Goiter Mother    Uterine cancer Mother 40   Stroke Father    Breast cancer Sister 59   Breast cancer Sister 38   Stroke Brother    Colon cancer Brother        dx 6s    Review of Systems: Review of Systems  Constitutional:  Negative for chills and fever.  HENT:  Negative for hearing loss.   Respiratory:  Negative for shortness of breath.   Cardiovascular:  Negative for chest pain.  Gastrointestinal:  Negative for nausea and vomiting.  Genitourinary:  Negative for dysuria.  Musculoskeletal:  Negative for myalgias.  Skin:  Negative for rash.  Neurological:  Negative for dizziness.  Psychiatric/Behavioral:  Negative for depression.     Physical Exam BP 122/75   Pulse 86   Temp 98.1 F (36.7 C)   Ht 5' 6 (1.676 m)   Wt 240 lb (108.9 kg)   LMP 04/10/2014   SpO2  93%   BMI 38.74 kg/m  CONSTITUTIONAL: No acute distress HEENT:  Normocephalic, atraumatic, extraocular motion intact. NECK: Trachea is midline, and there is no jugular venous distension.  RESPIRATORY:  Lungs are clear, and breath sounds are equal bilaterally. Normal respiratory effort without pathologic use of accessory muscles.  Patient wearing nasal cannula at 1 L, but was able to have it off during our examination. CARDIOVASCULAR: Heart is regular without murmurs, gallops, or rubs. BREAST: Left breast without any palpable masses, skin changes, or nipple changes.  Breast is large and difficult at times to palpate deeper areas.  No left axillary lymphadenopathy.  Right breast without any palpable masses, skin changes, or nipple changes.  Breast is large and difficult at times to palpate deeper areas.  No right axillary lymphadenopathy. MUSCULOSKELETAL:  Normal muscle strength and tone in all four extremities.  No peripheral edema or cyanosis. SKIN: Skin turgor is normal. There are no pathologic skin lesions.  NEUROLOGIC:  Motor and sensation is grossly normal.  Cranial nerves are grossly intact. PSYCH:  Alert and oriented to person, place and time. Affect is normal.  Laboratory Analysis: Labs from 08/06/2024: Sodium 144, potassium 4.2, chloride 100, CO2 33, BUN 14, creatinine 0.59.  Total bilirubin 0.4, AST 56, ALT 61, alkaline phosphatase 50.  Labs from 08/15/2024: WBC 5.5, hemoglobin 14, hematocrit 43.1, platelets 256.  Left breast biopsy on 08/11/2024: FINAL DIAGNOSIS       1. Breast, left, needle core biopsy, 2:00 15cmfn 5mm (coil clip) :       INVASIVE MAMMARY CARCINOMA       MAMMARY CARCINOMA IN SITU, SOLID INTERMEDIATE NUCLEAR GRADE       TUBULE FORMATION: SCORE 3       NUCLEAR PLEOMORPHISM: SCORE 2       MITOTIC COUNT: SCORE 1       TOTAL SCORE: 6       OVERALL GRADE: 2       LYMPHOVASCULAR INVASION: NOT IDENTIFIED       CANCER LENGTH: 4.5 MM / 0.45 CM       CALCIFICATIONS:  PRESENT  OTHER FINDINGS: NONE   Imaging: Mammogram and ultrasound on 08/11/2024: FINDINGS: Spot compression tomosynthesis views confirm a persistent focal asymmetry in the LEFT upper outer breast at middle depth. This is not stable compared to priors.   Targeted ultrasound was performed of the LEFT breast. At 2 o'clock 15 cm from the nipple, there is an irregular hypoechoic mass with an echogenic rind. This measures 6 by 3 by 3 mm. This is favored to correspond to the site of mammographic concern.   Targeted ultrasound was performed LEFT axilla. No suspicious axillary lymph nodes are visualized.   IMPRESSION: 1. There is a moderately suspicious 6 mm mass at the site of screening mammographic concern. Recommend ultrasound-guided biopsy for definitive characterization. 2. No suspicious LEFT axillary adenopathy.   RECOMMENDATION: LEFT breast ultrasound-guided biopsy x1   I have discussed the findings and recommendations with the patient. The biopsy procedure was discussed with the patient and questions were answered. Patient expressed their understanding of the biopsy recommendation. Patient will be scheduled for biopsy today. Ordering provider will be notified. If applicable, a reminder letter will be sent to the patient regarding the next appointment.   BI-RADS CATEGORY  4: Suspicious.  Assessment and Plan: This is a 58 y.o. female with left breast cancer.  - Discussed with patient the findings on her mammogram and ultrasound and biopsy results showing left breast cancer.  Discussed with patient the potential surgical options including lumpectomy versus mastectomy, the need for evaluating the axillary lymph nodes via sentinel lymph node biopsy, recommendation for radiation if doing a lumpectomy versus unlikely to need radiation if doing a mastectomy.  After further discussion, the patient has opted for a lumpectomy. - Discussed the patient and the plan for a left breast tag localized  lumpectomy and left axillary sentinel lymph node biopsy.  Reviewed with her the surgery at length including the preoperative tag localization at the [facility] breast center, planned incisions, risk of bleeding, infection, injury to surrounding structures, the use of radioactive tracer to better evaluate the lymph nodes, the use of methylene blue to better evaluate the lymph nodes, that this would be an outpatient procedure, postoperative activity restrictions, pain control, and she is willing to proceed. - Given her different medical issues, we will send for medical, cardiac, and pulmonary clearance.  Will schedule her tentatively for surgery on 09/15/2024, knowing that this may need to be postponed if any preop testing is required.  If able to keep this date, then we will ask her to hold her aspirin  with the last dose on 09/09/2024. - All of her questions have been answered.  I spent 55 minutes dedicated to the care of this patient on the date of this encounter to include pre-visit review of records, face-to-face time with the patient discussing diagnosis and management, and any post-visit coordination of care.   Aloysius Sheree Plant, MD [facility]

## 2024-08-29 NOTE — Telephone Encounter (Signed)
 Pt has been scheduled in office appt 08/30/24 Martha Woodard, [facility] @ 10:55 for preop clearance, pt has breast cancer.

## 2024-08-29 NOTE — Progress Notes (Signed)
 Request for Pulmonary Clearance has been faxed to Dr Dedra Sanders.

## 2024-08-29 NOTE — Patient Instructions (Addendum)
 We have spoken today about removing a lump in your breast. This will be done by Dr. Desiderio at [facility]. You will need to stop your Aspirin  before surgery. Your last day to take this will be 09/09/24  You will most likely be able to leave the hospital several hours after your surgery. Rarely, a patient needs to stay over night but this is a possibility.  Plan to tentatively be off work for 1-2 weeks following the surgery and may return with approximately 2 more weeks of a lifting restriction, no greater than 15 lbs.  Please see your Blue surgery sheet for more information. Our surgery scheduler will call you to give you your arrival time for surgery.   If you have FMLA or Disability paperwork that needs to be filled out, please have your company fax your paperwork to [phone] or you may drop this by either office. This paperwork will be filled out within 3 days after your surgery has been completed.  What is radio frequency localization of the breast?(RFID) RFID/Scout tag localization uses radiofrequency technology to accurately pinpoint the tumor. Seeing exactly where the tumor is before surgery helps surgeons more effectively remove the entire tumor and spare surrounding healthy breast tissue.   Lumpectomy A lumpectomy is a form of breast conserving or breast preservation surgery. It may also be referred to as a partial mastectomy. During a lumpectomy, the portion of the breast that contains the cancerous tumor or breast mass (the lump) is removed. Some normal tissue around the lump may also be removed to make sure all of the tumor has been removed.  LET [facility] CARE PROVIDER KNOW ABOUT: Any allergies you have. All medicines you are taking, including vitamins, herbs, eye drops, creams, and over-the-counter medicines. Previous problems you or members of your family have had with the use of anesthetics. Any blood disorders you have. Previous surgeries you have had. Medical conditions  you have. RISKS AND COMPLICATIONS Generally, this is a safe procedure. However, problems can occur and include: Bleeding. Infection. Pain. Temporary swelling. Change in the shape of the breast, particularly if a large portion is removed. BEFORE THE PROCEDURE Ask your health care provider about changing or stopping your regular medicines. This is especially important if you are taking diabetes medicines or blood thinners. Do not eat or drink anything after midnight on the night before the procedure or as directed by your health care provider. Ask your health care provider if you can take a sip of water with any approved medicines. On the day of surgery, your health care provider will use a mammogram or ultrasound to locate and mark the tumor in your breast. These markings on your breast will show where the cut (incision) will be made.   PROCEDURE  An IV tube will be put into one of your veins. You may be given medicine to help you relax before the surgery (sedative). You will be given one of the following: A medicine that numbs the area (local anesthetic). A medicine that makes you fall asleep (general anesthetic). Your health care provider will use a kind of electric scalpel that uses heat to minimize bleeding (electrocautery knife). A curved incision (like a smile or frown) that follows the natural curve of your breast is made, to allow for minimal scarring and better healing. The tumor will be removed with some of the surrounding tissue. This will be sent to the lab for analysis. Your health care provider may also remove your lymph nodes at  this time if needed. Sometimes, but not always, a rubber tube called a drain will be surgically inserted into your breast area or armpit to collect excess fluid that may accumulate in the space where the tumor was. This drain is connected to a plastic bulb on the outside of your body. This drain creates suction to help remove the fluid. The incisions will  be closed with stitches (sutures). A bandage may be placed over the incisions. AFTER THE PROCEDURE You will be taken to the recovery area. You will be given medicine for pain. A small rubber drain may be placed in the breast for 2-3 days to prevent a collection of blood (hematoma) from developing in the breast. You will be given instructions on caring for the drain before you go home. A pressure bandage (dressing) will be applied for 1-2 days to prevent bleeding. Ask your health care provider how to care for your bandage at home.   This information is not intended to replace advice given to you by your health care provider. Make sure you discuss any questions you have with your health care provider.   Document Released: 10/27/2006 Document Revised: 10/06/2014 Document Reviewed: 02/18/2013 Elsevier Interactive Patient Education [employer]

## 2024-08-29 NOTE — Progress Notes (Signed)
 Request for Cardiology Clearance has been faxed to Dr Timothy Gollan.

## 2024-08-30 ENCOUNTER — Telehealth: Payer: Self-pay | Admitting: Surgery

## 2024-08-30 ENCOUNTER — Ambulatory Visit: Attending: Medical | Admitting: Medical

## 2024-08-30 ENCOUNTER — Encounter: Payer: Self-pay | Admitting: Medical

## 2024-08-30 VITALS — BP 127/75 | HR 85 | Ht 66.0 in | Wt 241.4 lb

## 2024-08-30 DIAGNOSIS — R600 Localized edema: Secondary | ICD-10-CM | POA: Diagnosis not present

## 2024-08-30 DIAGNOSIS — I5032 Chronic diastolic (congestive) heart failure: Secondary | ICD-10-CM | POA: Diagnosis not present

## 2024-08-30 DIAGNOSIS — I2510 Atherosclerotic heart disease of native coronary artery without angina pectoris: Secondary | ICD-10-CM

## 2024-08-30 DIAGNOSIS — J432 Centrilobular emphysema: Secondary | ICD-10-CM

## 2024-08-30 DIAGNOSIS — E782 Mixed hyperlipidemia: Secondary | ICD-10-CM

## 2024-08-30 DIAGNOSIS — Z79899 Other long term (current) drug therapy: Secondary | ICD-10-CM

## 2024-08-30 DIAGNOSIS — Z01818 Encounter for other preprocedural examination: Secondary | ICD-10-CM | POA: Diagnosis not present

## 2024-08-30 DIAGNOSIS — E662 Morbid (severe) obesity with alveolar hypoventilation: Secondary | ICD-10-CM

## 2024-08-30 MED ORDER — POTASSIUM CHLORIDE ER 20 MEQ PO TBCR: 20.0000 meq | EXTENDED_RELEASE_TABLET | Freq: Two times a day (BID) | ORAL | 3 refills | Status: AC

## 2024-08-30 MED ORDER — FUROSEMIDE 40 MG PO TABS: 40.0000 mg | ORAL_TABLET | Freq: Every day | ORAL | 3 refills | Status: AC

## 2024-08-30 NOTE — Patient Instructions (Signed)
 Medication Instructions:  Your physician recommends the following medication changes.  STOP TAKING: Hydrochlorothiazide    INCREASE: Lasix  to 40 mg by mouth daily  Potassium to 20 meq by mouth daily    *If you need a refill on your cardiac medications before your next appointment, please call your pharmacy*  Lab Work: Your provider would like for you to return in 2 weeks to have the following labs drawn: BMP.   Please go to [facility] [address] ([facility]) [address], [place] [address] You do not need an appointment.  They are open from 8 am- 4:30 pm.  Lunch from 1:00 pm- 2:00 pm You will not need to be fasting.    Testing/Procedures: Your physician has requested that you have an echocardiogram. Echocardiography is a painless test that uses sound waves to create images of your heart. It provides your doctor with information about the size and shape of your heart and how well your heart's chambers and valves are working.   You may receive an ultrasound enhancing agent through an IV if needed to better visualize your heart during the echo. This procedure takes approximately one hour.  There are no restrictions for this procedure.  This will take place at [address] [facility]) [address], [place] [address]  Please note: We ask at that you not bring children with you during ultrasound (echo/ vascular) testing. Due to room size and safety concerns, children are not allowed in the ultrasound rooms during exams. Our front office staff cannot provide observation of children in our lobby area while testing is being conducted. An adult accompanying a patient to their appointment will only be allowed in the ultrasound room at the discretion of the ultrasound technician under special circumstances. We apologize for any inconvenience.   Follow-Up: At [facility], you and your health needs are our priority.  As part of our continuing mission to  provide you with exceptional heart care, our providers are all part of one team.  This team includes your primary Cardiologist (physician) and Advanced Practice Providers or APPs (Physician Assistants and Nurse Practitioners) who all work together to provide you with the care you need, when you need it.  Your next appointment:   6 month(s)  Provider:   Timothy Gollan, MD or Cadence Franchester, PA-C

## 2024-08-30 NOTE — Progress Notes (Signed)
 Cardiology Office Note   Date:  08/30/2024  ID:  Martha Woodard Oct 23, 1965, MRN [id number] PCP: Abbey Bruckner, MD  [facility] Providers Cardiologist:  None   History of Present Illness Martha Woodard is a 58 y.o. female with a h/o diabetes, COPD, former smoker, lung nodules, diastolic heart failure, obesity hypoventilation syndrome, coronary calcification on CT who presents for follow-up.   Cardiac CTA in April 2023 showed mild coronary calcium  and mild aortic atherosclerosis.  Patient was last seen 03/2024 and was stable from a cardiac perspective.    Today, the patient is needing a pre-op evaluation for breast lumpectomy. This is scheduled for December 18th. She reports lower leg edema that seems to be getting worse. She takes lasix  and hydrochlorothiazide . She has stable chronic SOB on 1L O2 when she goes out, and 2L O2 at night. She denies chest pain, lightheadedness, dizziness, palpitations.   Studies Reviewed EKG Interpretation Date/Time:  Tuesday August 30 2024 10:57:32 EST Ventricular Rate:  85 PR Interval:  176 QRS Duration:  96 QT Interval:  366 QTC Calculation: 435 R Axis:   -13  Text Interpretation: Normal sinus rhythm Cannot rule out Anterior infarct , age undetermined When compared with ECG of 18-Apr-2024 13:23, No significant change was found Confirmed by Franchester, [name] ([id number]) on 08/30/2024 11:12:45 AM    Echo 2016 Study Conclusions   - Procedure narrative: Transthoracic echocardiography. Image    quality was poor. The study was technically difficult, as a    result of poor acoustic windows, poor sound wave transmission,    and body habitus.  - Left ventricle: The cavity size was normal. There was mild    concentric hypertrophy. Systolic function was normal. The    estimated ejection fraction was in the range of 60% to 65%. Wall    motion was normal; there were no regional wall motion    abnormalities. Doppler parameters are consistent with  abnormal    left ventricular relaxation (grade 1 diastolic dysfunction).  - Left atrium: The atrium was mildly dilated.  - Right ventricle: Systolic function was normal.  - Pulmonary arteries: Systolic pressure was within the normal    range.        Physical Exam VS:  BP 127/75   Pulse 85   Ht 5' 6 (1.676 m)   Wt 241 lb 6.4 oz (109.5 kg)   LMP 04/10/2014   SpO2 92%   BMI 38.96 kg/m        Wt Readings from Last 3 Encounters:  08/30/24 241 lb 6.4 oz (109.5 kg)  08/29/24 240 lb (108.9 kg)  08/19/24 241 lb (109.3 kg)    GEN: Well nourished, well developed in no acute distress NECK: No JVD; No carotid bruits CARDIAC: RRR, no murmurs, rubs, gallops RESPIRATORY:  Clear to auscultation without rales, wheezing or rhonchi  ABDOMEN: Soft, non-tender, non-distended EXTREMITIES:  1+ lower leg edema; No deformity   ASSESSMENT AND PLAN  Lower leg edema HFpEF Patient reports worsening lower leg edema. She is taking lasix  and hydrochlorothiazide . I will update an echocardiogram. I will stop hydrochlorothiazide  and increase lasix  to 40mg  daily and klor-con  to . BMET in 2 weeks.   COPD OHS She is on 1L when she goes out and2L O2 at night. Breathing is unchanged. She follows with pulmonology.   Coronary calcification Mild CAC on Chest CT. No chest pain reported.   HLD LDL 31. Continue Crestor  5mg  daily and Zetia  10mg  daily.   Pre-operative evaluation The  patient is needing breast lumpectomy for breast cancer, which is scheduled December 18th. Plan for echo as above. If this is stable, can pursue surgery. EKG shows NSR, no ischemic changes. Monitor volume status closely. Ok to stop ASA perioperatively per surgeon instructions.  METS<4 RCRI = 5% risk of MACE     Dispo: Follow-up in 6 months  Signed, Estephania Licciardi [name], PA-C

## 2024-08-30 NOTE — Telephone Encounter (Signed)
 Patient has been advised of Pre-Admission date/time, and Surgery date at [facility].  Surgery Date: 09/15/24, patient to arrive no later than 9:45 am as will be having SLN bx injection done first prior to surgery.    Preadmission Testing Date: 09/07/24 (phone 8a-1p)  Patient also reminded of breast tag placement at [facility] 09/05/24.

## 2024-08-31 ENCOUNTER — Inpatient Hospital Stay: Attending: Internal Medicine | Admitting: Occupational Therapy

## 2024-08-31 ENCOUNTER — Encounter: Payer: Self-pay | Admitting: Occupational Therapy

## 2024-08-31 DIAGNOSIS — Z794 Long term (current) use of insulin: Secondary | ICD-10-CM | POA: Diagnosis not present

## 2024-08-31 DIAGNOSIS — C50412 Malignant neoplasm of upper-outer quadrant of left female breast: Secondary | ICD-10-CM | POA: Diagnosis present

## 2024-08-31 DIAGNOSIS — Z170 Estrogen receptor positive status [ER+]: Secondary | ICD-10-CM | POA: Diagnosis present

## 2024-08-31 DIAGNOSIS — E1140 Type 2 diabetes mellitus with diabetic neuropathy, unspecified: Secondary | ICD-10-CM | POA: Diagnosis not present
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
## 2024-09-01 ENCOUNTER — Other Ambulatory Visit: Payer: Self-pay | Admitting: Medical

## 2024-09-01 ENCOUNTER — Encounter: Payer: Self-pay | Admitting: Pulmonary Disease

## 2024-09-01 ENCOUNTER — Ambulatory Visit: Admitting: Pulmonary Disease

## 2024-09-01 VITALS — BP 122/74 | HR 90 | Temp 97.7°F | Ht 66.0 in | Wt 241.0 lb

## 2024-09-01 DIAGNOSIS — Z01818 Encounter for other preprocedural examination: Secondary | ICD-10-CM

## 2024-09-01 DIAGNOSIS — J9611 Chronic respiratory failure with hypoxia: Secondary | ICD-10-CM

## 2024-09-01 DIAGNOSIS — J9612 Chronic respiratory failure with hypercapnia: Secondary | ICD-10-CM

## 2024-09-01 DIAGNOSIS — E782 Mixed hyperlipidemia: Secondary | ICD-10-CM

## 2024-09-01 DIAGNOSIS — Z79899 Other long term (current) drug therapy: Secondary | ICD-10-CM

## 2024-09-01 DIAGNOSIS — E662 Morbid (severe) obesity with alveolar hypoventilation: Secondary | ICD-10-CM

## 2024-09-01 DIAGNOSIS — J4489 Other specified chronic obstructive pulmonary disease: Secondary | ICD-10-CM | POA: Diagnosis not present

## 2024-09-01 DIAGNOSIS — I2510 Atherosclerotic heart disease of native coronary artery without angina pectoris: Secondary | ICD-10-CM

## 2024-09-01 DIAGNOSIS — J449 Chronic obstructive pulmonary disease, unspecified: Secondary | ICD-10-CM

## 2024-09-01 DIAGNOSIS — J432 Centrilobular emphysema: Secondary | ICD-10-CM

## 2024-09-01 DIAGNOSIS — Z01811 Encounter for preprocedural respiratory examination: Secondary | ICD-10-CM

## 2024-09-01 DIAGNOSIS — I5032 Chronic diastolic (congestive) heart failure: Secondary | ICD-10-CM

## 2024-09-01 DIAGNOSIS — R600 Localized edema: Secondary | ICD-10-CM

## 2024-09-01 NOTE — Patient Instructions (Signed)
 VISIT SUMMARY:  You came in today for a pulmonary preoperative evaluation for your upcoming lumpectomy for early-stage left breast cancer. We discussed your current health status, including your COPD with asthma overlap and obesity hypoventilation syndrome, and reviewed your surgical and postoperative plans.  YOUR PLAN:  -PREOPERATIVE PULMONARY EVALUATION FOR LEFT BREAST CANCER LUMPECTOMY AND PLANNED RADIATION: You are scheduled for a lumpectomy and sentinel node dissection for early-stage left breast cancer, followed by radiation therapy. Your surgical risk is classified as low to moderate due to your need for supplemental oxygen . The anesthesiologists will monitor your oxygen  needs closely during and after the surgery.  -CHRONIC OBSTRUCTIVE PULMONARY DISEASE WITH ASTHMA OVERLAP: Chronic obstructive pulmonary disease (COPD) with asthma overlap means you have symptoms of both COPD and asthma, which can make breathing difficult. Your condition appears well-controlled as you have had no recent exacerbations or bronchitis, and your lung sounds are clear.  -OBESITY HYPOVENTILATION SYNDROME: Obesity hypoventilation syndrome is a condition where poor breathing due to obesity leads to low oxygen  and high carbon dioxide levels in the blood. You require supplemental oxygen , and your oxygen  needs will be closely monitored during your surgical procedures.  INSTRUCTIONS:  Proceed with your scheduled lumpectomy and sentinel node dissection. Ensure that the anesthesiologists monitor your oxygen  needs closely during and after the surgery. You are also scheduled to see Dr. Rennie on the twelfth and start radiation therapy in the afternoon with Dr. Lenn.

## 2024-09-01 NOTE — Progress Notes (Unsigned)
 Pulmonary Clearance has been received from Dr Tamea. The patient is cleared at Medium risk.

## 2024-09-01 NOTE — Progress Notes (Signed)
 Subjective:    Patient ID: Martha Woodard, female    DOB: [birth year], 58 y.o.   MRN: [id number]  Patient Care Team: Bair, Kalpana, MD as PCP - General (Family Medicine) Perla Evalene PARAS, MD as Consulting Physician (Cardiology) Tamea Dedra CROME, MD as Consulting Physician (Pulmonary Disease) Mevelyn Charleston, OD as Referring Physician (Optometry) Georgina Shasta POUR, RN as Oncology Nurse Navigator Rennie Cindy SAUNDERS, MD as Consulting Physician (Oncology)  Chief Complaint  Patient presents with   COPD    No cough or wheezing. Shortness of breath on exertion.  Patient is scheduled for left breast lumpectomy, general anesthesia. Scheduled for 09/15/2024.     BACKGROUND/INTERVAL:Martha Woodard is a 58 year old former smoker who presents today for follow-up asthma/COPD overlap syndrome. She is maintained on Bevespi  and Pulmicort  neb solution.  She was last seen on 26 July 2024.  No exacerbations since then.  She presents today for preoperative assessment prior to breast lumpectomy.   PROBLEMS: Chronic hypoxic/hypercarbic respiratory failure Former smoker Moderate/severe COPD OHS CHF Multiple pulmonary nodules  HPI Discussed the use of AI scribe software for clinical note transcription with the patient, who gave verbal consent to proceed.  History of Present Illness   Martha Woodard is a 58 year old female with COPD, asthma overlap, and obesity hypoventilation syndrome who presents for a pulmonary preoperative evaluation for an upcoming lumpectomy for left breast cancer.  She is scheduled for a lumpectomy due to early-stage left breast cancer, with plans for a sentinel node dissection and subsequent radiation therapy. She is scheduled to see Dr. Rennie on the twelfth and start radiation in the afternoon with Dr. Lenn.  She has a history of COPD and asthma overlap but has experienced no recent breathing problems since her last visit in October. No episodes of bronchitis have occurred,  and she only mentions sinus issues, which she is managing. She uses supplemental oxygen  but does not use CPAP therapy.  She has obesity hypoventilation syndrome. The last time she was under general anesthesia was for a colonoscopy in 2017, which she tolerated well.  No recent breathing problems, including bronchitis, and only reports sinus issues.      DATA: 09/28/2015 2D echo: EF 60 to 65%, grade 1 DD, mild left atrial dilation.  Right-sided pressures normal. 02/28/2020 PFTs:FEV1 1.42L or 49%, FVC or 2.24L 60%, FEV1/FVC 63%, no bronchodilator response.  RV is 122% indicating air trapping.  Diffusion capacity normal.  Consistent with moderate to severe COPD. 04/05/2020 chest x-ray: No active disease.  Consistent with COPD, left base scarring/atelectasis. 01/21/2022 LDCT: Mild to moderate centrilobular emphysema.  Multiple bilateral pulmonary nodules of which the larger were present on 01/17/2016 exam.  Some new nodules from 2017 exam.  Coronary artery calcifications.  Lung RADS 3 short-term follow-up 6 months ordered 07/16/2022 LDCT chest: Mild paraseptal and centrilobular emphysema use bronchial wall thickening.  Numerous scattered solid pulmonary nodules, solitary new basilar right upper lobe pulmonary nodule measuring 4.8 mm.  Lung RADS 3 probably benign finding short-term follow-up in 6 months recommended 09/15/2022 chest x-ray PA and lateral: Hyperinflation of the lungs, flattening of the diaphragms consistent with COPD, bronchial cuffing and increased interstitial markings consistent with bronchitis.  Increased focal opacity in right base could represent atelectasis or early developing pneumonia, chronic opacity in the left base consistent with scar atelectasis. 12/04/2022 chest x-ray PA and lateral: COPD with basilar scarring, no acute findings. 03/16/2024 PFTs: FEV1 1.37 L or 49% predicted, FVC 2.12 L or 59% predicted, FEV1/FVC 65%.  There is a modest response to bronchodilators.  Lung volumes  are normal with mild hyperinflation and air trapping noted.  Diffusion capacity normal to supranormal.  Overall no significant decline from prior   Review of Systems A 10 point review of systems was performed and it is as noted above otherwise negative.   Patient Active Problem List   Diagnosis Date Noted   Genetic testing 08/23/2024   Need for tetanus booster 08/19/2024   Malignant neoplasm of upper-outer quadrant of left breast in female, estrogen receptor positive (HCC) 08/17/2024   Type 2 diabetes mellitus with diabetic neuropathy, without long-term current use of insulin  (HCC) 07/06/2024   Nausea 07/06/2024   Vitamin B 12 deficiency 07/06/2024   Needs flu shot 07/06/2024   Abnormal liver function 07/06/2024   Sore in nose 10/26/2023   Lung nodule 04/14/2023   Thrush 01/12/2023   Lipoma 04/07/2022   Papilloma of oral cavity 12/13/2018   Chronic pain of both knees 06/16/2018   Allergic rhinitis 11/09/2017   Venous insufficiency 08/07/2017   Chronic respiratory failure (HCC) 11/13/2016   Encounter for diabetic foot exam (HCC) 02/13/2016   Thyroid  nodule 11/28/2015   COPD (chronic obstructive pulmonary disease) with emphysema (HCC) 11/13/2015   Chronic diastolic heart failure (HCC) 11/13/2015   Morbid obesity with BMI of 45.0-49.9, adult (HCC) 10/01/2015   Obesity hypoventilation syndrome (HCC) 10/01/2015   Hypertension associated with diabetes (HCC) 10/01/2015   Diabetes mellitus (HCC) 06/26/2014   Mixed hyperlipidemia 06/26/2014    Social History   Tobacco Use   Smoking status: Former    Current packs/day: 0.00    Average packs/day: 3.0 packs/day for 34.0 years (102.0 ttl pk-yrs)    Types: Cigarettes    Start date: 07/11/1979    Quit date: 07/10/2013    Years since quitting: 11.1    Passive exposure: Past   Smokeless tobacco: Never  Substance Use Topics   Alcohol use: No    Alcohol/week: 0.0 standard drinks of alcohol    Allergies  Allergen Reactions    Amoxicillin -Pot Clavulanate Itching and Hives    Hives  Can take PCN and Amoxicillin  alone    Current Meds  Medication Sig   Accu-Chek Softclix Lancets lancets TEST UP TO 4 TIMES DAILY   acetaminophen  (TYLENOL ) 500 MG tablet Take 500 mg by mouth every 6 (six) hours as needed.   albuterol  (PROVENTIL ) (2.5 MG/3ML) 0.083% nebulizer solution Take 3 mLs (2.5 mg total) by nebulization every 6 (six) hours as needed for wheezing or shortness of breath.   albuterol  (VENTOLIN  HFA) 108 (90 Base) MCG/ACT inhaler Inhale 2 puffs into the lungs every 6 (six) hours as needed for wheezing or shortness of breath.   aspirin  EC 81 MG tablet Take 1 tablet (81 mg total) by mouth daily.   budesonide  (PULMICORT ) 0.5 MG/2ML nebulizer solution Use 1 vial twice a daily as directed.   calcium -vitamin D  (OSCAL WITH D) 500-200 MG-UNIT tablet Take 1 tablet by mouth daily with breakfast.   cetirizine (ZYRTEC) 10 MG tablet Take 10 mg by mouth daily.   ezetimibe  (ZETIA ) 10 MG tablet Take 1 tablet (10 mg total) by mouth daily.   fluticasone  (FLONASE ) 50 MCG/ACT nasal spray USE 1 OR 2 SPRAYS IN EACH NOSTRIL ONCE DAILY.   furosemide  (LASIX ) 40 MG tablet Take 1 tablet (40 mg total) by mouth daily.   gabapentin  (NEURONTIN ) 100 MG capsule Take 1 capsule (100 mg total) by mouth 2 (two) times daily.   glucose blood (ACCU-CHEK AVIVA  PLUS) test strip TEST UP TO 4 TIMES DAILY AS DIRECTED   Glycopyrrolate-Formoterol  (BEVESPI  AEROSPHERE) 9-4.8 MCG/ACT AERO Inhale 2 puffs into the lungs 2 (two) times daily.   losartan  (COZAAR ) 25 MG tablet TAKE 1 TABLET BY MOUTH TWICE  DAILY   magnesium oxide (MAG-OX) 400 MG tablet Take 400 mg by mouth daily.   metFORMIN  (GLUCOPHAGE ) 500 MG tablet Take 2 tablets (1,000 mg total) by mouth 2 (two) times daily with a meal.   metoprolol  succinate (TOPROL -XL) 25 MG 24 hr tablet TAKE 1 TABLET BY MOUTH DAILY   montelukast  (SINGULAIR ) 10 MG tablet Take 1 tablet (10 mg total) by mouth at bedtime.   Multiple  Vitamin (MULTIVITAMIN) tablet Take 1 tablet by mouth daily.   nystatin  (MYCOSTATIN ) 100000 UNIT/ML suspension Take 5 mLs (500,000 Units total) by mouth 4 (four) times daily. (Patient taking differently: Take 5 mLs by mouth 4 (four) times daily as needed.)   ondansetron  (ZOFRAN ) 4 MG tablet Take 1 tablet (4 mg total) by mouth daily as needed for nausea or vomiting.   OXYGEN  Inhale 1 L/L into the lungs continuous as needed. Uses 1 liter when out of the house and 2 liters at night.   potassium chloride  20 MEQ TBCR Take 1 tablet (20 mEq total) by mouth 2 (two) times daily.   PROVENTIL  HFA 108 (90 Base) MCG/ACT inhaler Inhale 2 puffs into the lungs every 6 (six) hours as needed for wheezing or shortness of breath. 18 gram inhaler   rosuvastatin  (CRESTOR ) 5 MG tablet Take 1 tablet (5 mg total) by mouth daily.   sodium chloride  (OCEAN) 0.65 % SOLN nasal spray Place 2 sprays into both nostrils as needed for congestion.   vitamin C (ASCORBIC ACID) 500 MG tablet Take 1,000 mg by mouth daily.     Immunization History  Administered Date(s) Administered   Influenza, Seasonal, Injecte, Preservative Fre 07/21/2023, 07/06/2024   Influenza,inj,Quad PF,6+ Mos 07/02/2015, 08/19/2016, 08/07/2017, 06/16/2018, 06/29/2019, 07/31/2020, 07/05/2021, 09/10/2022   Moderna SARS-COV2 Booster Vaccination 11/05/2020   Moderna Sars-Covid-2 Vaccination 03/08/2020, 04/05/2020   PNEUMOCOCCAL CONJUGATE-20 07/21/2023   Pneumococcal Polysaccharide-23 09/26/2015   Pneumococcal-Unspecified 11/23/2020   Respiratory Syncytial Virus Vaccine ,Recomb Aduvanted(Arexvy ) 08/19/2023        Objective:     Vitals:   09/01/24 1314  BP: 122/74  Pulse: 90  Temp: 97.7 F (36.5 C)  Height: 5' 6 (1.676 m)  Weight: 241 lb (109.3 kg)  SpO2: 94% Comment: 1L continuous oxygen   TempSrc: Temporal  BMI (Calculated): 38.92     SpO2: 94 % (1L continuous oxygen )  GENERAL: Obese woman, no acute distress, comfortable with nasal cannula O2.   Fully ambulatory. HEAD: Normocephalic, atraumatic. EYES: Pupils equal, round, reactive to light.  No scleral icterus. MOUTH: Dentition intact, oral mucosa moist, no thrush. NECK: Supple. No thyromegaly. Trachea midline. No JVD.  No adenopathy. PULMONARY: Distant breath sounds bilaterally.  Coarse, otherwise, no adventitious sounds.   CARDIOVASCULAR: S1 and S2. Regular rate and rhythm.  No rubs, murmurs or gallops heard. ABDOMEN: Obese, otherwise benign. MUSCULOSKELETAL: No joint deformity, no clubbing, no edema. NEUROLOGIC: No focal deficit, no gait disturbance, speech is fluent. SKIN: Intact,warm,dry. PSYCH: Mood and behavior normal.  ARISCAT score: 27, intermediate risk (13.3% risk of in-hospital postop pulmonary complications).      Assessment & Plan:     ICD-10-CM   1. Stage 3 severe COPD by GOLD classification (HCC)  J44.9     2. Asthma-COPD overlap syndrome (HCC)  J44.89     3.  Chronic respiratory failure with hypoxia and hypercapnia (HCC)  J96.11    J96.12     4. Obesity hypoventilation syndrome (HCC)  E66.2     5. Preoperative respiratory examination  Z01.811      Discussion:    Preoperative pulmonary evaluation for left breast cancer lumpectomy and planned radiation Scheduled for lumpectomy and sentinel node dissection for early-stage left breast cancer, followed by radiation therapy. Classified as moderate surgical risk due to COPD/asthma overlap, obesity with alveolar hypoventilation and oxygen  requirement. Anesthesiologists are experienced and will monitor oxygen  needs post-intubation. - Proceed with lumpectomy and sentinel node dissection as planned - Ensure anesthesiologists monitor oxygen  needs post-intubation  Chronic obstructive pulmonary disease with asthma overlap No recent exacerbations or bronchitis since October. Lung sounds are clear, indicating well-controlled condition.  Obesity hypoventilation syndrome Requires supplemental oxygen . No CPAP use  reported. Oxygen  needs to be monitored during surgical procedures. - Ensure oxygen  needs are monitored during surgical procedures    Follow-up as scheduled in 28 November 2024.   Advised if symptoms do not improve or worsen, to please contact office for sooner follow up or seek emergency care.    I spent 35 minutes of dedicated to the care of this patient on the date of this encounter to include pre-visit review of records, face-to-face time with the patient discussing conditions above, post visit ordering of testing, clinical documentation with the electronic health record, making appropriate referrals as documented, and communicating necessary findings to members of the patients care team.     C. Leita Sanders, MD Advanced Bronchoscopy PCCM [facility]    *This note was generated using voice recognition software/Dragon and/or AI transcription program.  Despite best efforts to proofread, errors can occur which can change the meaning. Any transcriptional errors that result from this process are unintentional and may not be fully corrected at the time of dictation.

## 2024-09-03 ENCOUNTER — Other Ambulatory Visit: Payer: Self-pay | Admitting: Pulmonary Disease

## 2024-09-05 ENCOUNTER — Inpatient Hospital Stay: Admission: RE | Admit: 2024-09-05 | Discharge: 2024-09-05 | Attending: Surgery

## 2024-09-05 ENCOUNTER — Ambulatory Visit
Admission: RE | Admit: 2024-09-05 | Discharge: 2024-09-05 | Disposition: A | Source: Ambulatory Visit | Attending: Surgery

## 2024-09-05 ENCOUNTER — Other Ambulatory Visit: Payer: Self-pay | Admitting: Surgery

## 2024-09-05 DIAGNOSIS — C50919 Malignant neoplasm of unspecified site of unspecified female breast: Secondary | ICD-10-CM

## 2024-09-05 HISTORY — PX: BREAST BIOPSY: SHX20

## 2024-09-05 MED ORDER — LIDOCAINE 1 % OPTIME INJ - NO CHARGE
5.0000 mL | Freq: Once | INTRAMUSCULAR | Status: DC
  Filled 2024-09-05: qty 6

## 2024-09-05 MED ORDER — LIDOCAINE HCL (PF) 1 % IJ SOLN
5.0000 mL | Freq: Once | INTRAMUSCULAR | Status: AC
  Administered 2024-09-05: 5 mL
  Filled 2024-09-05: qty 5

## 2024-09-05 MED ORDER — LIDOCAINE HCL 1 % IJ SOLN
5.0000 mL | Freq: Once | INTRAMUSCULAR | Status: AC
  Administered 2024-09-05: 5 mL
  Filled 2024-09-05: qty 5

## 2024-09-06 ENCOUNTER — Inpatient Hospital Stay: Attending: Internal Medicine

## 2024-09-06 ENCOUNTER — Ambulatory Visit: Attending: Medical

## 2024-09-06 ENCOUNTER — Other Ambulatory Visit: Payer: Self-pay

## 2024-09-06 DIAGNOSIS — J309 Allergic rhinitis, unspecified: Secondary | ICD-10-CM

## 2024-09-06 DIAGNOSIS — R600 Localized edema: Secondary | ICD-10-CM

## 2024-09-06 DIAGNOSIS — Z01818 Encounter for other preprocedural examination: Secondary | ICD-10-CM

## 2024-09-06 LAB — ECHOCARDIOGRAM COMPLETE
AR max vel: 2.67 cm2
AV Area VTI: 2.72 cm2
AV Area mean vel: 2.56 cm2
AV Mean grad: 3 mmHg
AV Peak grad: 5.7 mmHg
Ao pk vel: 1.19 m/s
Area-P 1/2: 3.16 cm2
S' Lateral: 3.2 cm

## 2024-09-06 NOTE — Progress Notes (Signed)
 [facility] CSW Progress Note  Clinical Child Psychotherapist contacted patient by phone to follow-up on advance directives questions.    Interventions: Provided education and assistance to client regarding Advanced Directives.       Follow Up Plan:  CSW will see patient on Tuesday, December 16th at 11:30.    [name], LCSW Clinical Social Worker [facility]

## 2024-09-07 ENCOUNTER — Inpatient Hospital Stay: Admission: RE | Admit: 2024-09-07 | Discharge: 2024-09-07 | Attending: Surgery

## 2024-09-07 ENCOUNTER — Other Ambulatory Visit: Payer: Self-pay

## 2024-09-07 VITALS — Ht 66.0 in | Wt 241.0 lb

## 2024-09-07 DIAGNOSIS — E1140 Type 2 diabetes mellitus with diabetic neuropathy, unspecified: Secondary | ICD-10-CM

## 2024-09-07 DIAGNOSIS — Z01812 Encounter for preprocedural laboratory examination: Secondary | ICD-10-CM

## 2024-09-07 NOTE — Patient Instructions (Addendum)
 Your procedure is scheduled nw:UYLMDIJB DECEMBER 18  Report to the Registration Desk on the 1st floor of the [facility] To find out your arrival time, please call [phone] between 1PM - 3PM on:  [facility] DECEMBER 17  If your arrival time is 6:00 am, do not arrive before that time as the [facility] entrance doors do not open until 6:00 am.  REMEMBER: Instructions that are not followed completely may result in serious medical risk, up to and including death; or upon the discretion of your surgeon and anesthesiologist your surgery may need to be rescheduled.  Do not eat food after midnight the night before surgery.  No gum chewing or hard candies.  You may however, drink WATER  up to 2 hours before you are scheduled to arrive for your surgery. Do not drink anything within 2 hours of your scheduled arrival time.  One week prior to surgery:  STARTING THURSDAY DECEMBER 11 Stop Anti-inflammatories (NSAIDS) such as Advil, Aleve, Ibuprofen, Motrin, Naproxen, Naprosyn and Aspirin  based products such as Excedrin, Goody's Powder, BC Powder. Stop ANY OVER THE COUNTER supplements until after surgery. calcium -vitamin D  (OSCAL WITH D)  cetirizine (ZYRTEC)  fluticasone  (FLONASE )  magnesium oxide (MAG-OX)  montelukast  (SINGULAIR )  Multiple Vitamin (MULTIVITAMIN)  vitamin C (ASCORBIC ACID)   You may however, continue to take Tylenol  if needed for pain up until the day of surgery.  **Follow guidelines for insulin  and diabetes medications.** metFORMIN  (GLUCOPHAGE ) hold 2 days prior to surgery, last dose MONDAY DECEMBER 15   Continue taking all of your other prescription medications up until the day of surgery.  ON THE DAY OF SURGERY ONLY TAKE THESE MEDICATIONS WITH SIPS OF WATER:  metoprolol  succinate (TOPROL -XL)   Use inhalers on the day of surgery and bring to the hospital. albuterol  (PROVENTIL )  albuterol  (VENTOLIN  HFA)  Glycopyrrolate-Formoterol  (BEVESPI  AEROSPHERE)   No Smoking  including e-cigarettes for 24 hours before surgery.   Do not use any recreational drugs for at least a week (preferably 2 weeks) before your surgery.  Please be advised that the combination of cocaine and anesthesia may have negative outcomes, up to and including death. If you test positive for cocaine, your surgery will be cancelled.  On the morning of surgery brush your teeth with toothpaste and water, you may rinse your mouth with mouthwash if you wish. Do not swallow any toothpaste or mouthwash.  Use CHG Soap as directed on instruction sheet.  Do not wear jewelry, make-up, hairpins, clips or nail polish.  For welded (permanent) jewelry: bracelets, anklets, waist bands, etc.  Please have this removed prior to surgery.  If it is not removed, there is a chance that hospital personnel will need to cut it off on the day of surgery.  Do not wear lotions, powders, or perfumes.   Do not shave body hair from the neck down 48 hours before surgery.  Do not bring valuables to the hospital. [facility] is not responsible for any missing/lost belongings or valuables.   Notify your doctor if there is any change in your medical condition (cold, fever, infection).  Wear comfortable clothing (specific to your surgery type) to the hospital.  After surgery, you can help prevent lung complications by doing breathing exercises.  Take deep breaths and cough every 1-2 hours.   If you are being discharged the day of surgery, you will not be allowed to drive home. You will need a responsible individual to drive you home and stay with you for 24  hours after surgery.   If you are taking public transportation, you will need to have a responsible individual with you.  Please call the Pre-admissions Testing Dept. at [phone] if you have any questions about these instructions.  Surgery Visitation Policy:  Patients having surgery or a procedure may have two visitors.  Children under the age of 76  must have an adult with them who is not the patient.  Merchandiser, Retail to address health-related social needs:  [email or web address]                                                                                                                Preparing for Surgery with CHLORHEXIDINE GLUCONATE (CHG) Soap  Chlorhexidine Gluconate (CHG) Soap  o An antiseptic cleaner that kills germs and bonds with the skin to continue killing germs even after washing  o Used for showering the night before surgery and morning of surgery  Before surgery, you can play an important role by reducing the number of germs on your skin.  CHG (Chlorhexidine gluconate) soap is an antiseptic cleanser which kills germs and bonds with the skin to continue killing germs even after washing.  Please do not use if you have an allergy to CHG or antibacterial soaps. If your skin becomes reddened/irritated stop using the CHG.  1. Shower the NIGHT BEFORE SURGERY with CHG soap.  2. If you choose to wash your hair, wash your hair first as usual with your normal shampoo.  3. After shampooing, rinse your hair and body thoroughly to remove the shampoo.  4. Use CHG as you would any other liquid soap. You can apply CHG directly to the skin and wash gently with a clean washcloth.  5. Apply the CHG soap to your body only from the neck down. Do not use on open wounds or open sores. Avoid contact with your eyes, ears, mouth, and genitals (private parts). Wash face and genitals (private parts) with your normal soap.  6. Wash thoroughly, paying special attention to the area where your surgery will be performed.  7. Thoroughly rinse your body with warm water.  8. Do not shower/wash with your normal soap after using and rinsing off the CHG soap.  9. Do not use lotions, oils, etc., after showering with CHG.  10. Pat yourself dry with a clean towel.  11. Wear clean pajamas to bed the night before  surgery.  12. Place clean sheets on your bed the night of your shower and do not sleep with pets.  13. Do not apply any deodorants/lotions/powders.  14. Please wear clean clothes to the hospital.  15. Remember to brush your teeth with your regular toothpaste.

## 2024-09-08 ENCOUNTER — Telehealth: Payer: Self-pay | Admitting: Cardiovascular Disease

## 2024-09-08 ENCOUNTER — Ambulatory Visit: Payer: Self-pay | Admitting: Medical

## 2024-09-08 NOTE — Telephone Encounter (Signed)
° °  Pre-operative Risk Assessment    Patient Name: Martha Woodard  DOB: [birth year] MRN: [id number]   Date of last office visit: unknown Date of next office visit: unknown   Request for Surgical Clearance    Procedure:  Left breast lumpectomy  Date of Surgery:  Clearance 09/15/24                                Surgeon:  dr. Aloysius Plant Surgeon's Group or Practice Name:  [facility] Phone number:  [phone] Fax number:  [phone]   Type of Clearance Requested:   - Medical    Type of Anesthesia:  General    Additional requests/questions:    SignedBerwyn LELON Sprung   09/08/2024, 3:50 PM

## 2024-09-12 ENCOUNTER — Encounter: Payer: Self-pay | Admitting: Surgery

## 2024-09-12 NOTE — Progress Notes (Signed)
 Perioperative / Anesthesia Services  Pre-Admission Testing Clinical Review / Pre-Operative Anesthesia Consult  Date: 09/12/2024  PATIENT DEMOGRAPHICS: Name: Martha Woodard DOB: March 04, 1966 MRN:   [id number]  Note: Available PAT nursing documentation and vital signs have been reviewed. Clinical nursing staff has updated patient's PMH/PSHx, current medication list, and drug allergies/intolerances to ensure complete and comprehensive history available to assist care teams in MDM as it pertains to the aforementioned surgical procedure and anticipated anesthetic course. Extensive review of available clinical information personally performed. Nursing documentation reviewed. [place] [facility] and PSHx updated with any diagnoses and/or procedures that I have knowledge of that may have been inadvertently omitted during her intake with the pre-admission testing department's nursing staff.  PLANNED SURGICAL PROCEDURE(S):   Case: [id number] Date/Time: 09/15/24 1145   Procedures:      BREAST LUMPECTOMY WITH RADIO FREQUENCY LOCALIZER (Left)     BIOPSY, LYMPH NODE, SENTINEL, AXILLARY (Left)   Anesthesia type: General   Diagnosis: Malignant neoplasm of upper-outer quadrant of left breast in female, estrogen receptor positive (HCC) [C50.412, Z17.0]   Pre-op diagnosis: left breast cancer   Location: [facility] OR ROOM 06 / [facility]   Surgeons: Desiderio Schanz, MD        CLINICAL DISCUSSION: KALISE FICKETT is a 58 y.o. female who is submitted for pre-surgical anesthesia review and clearance prior to her undergoing the above procedure. Patient is a Former Smoker (102 pack years; quit 06/2013). Pertinent PMH includes: CAD, HFpEF, aortic atherosclerosis, HTN, HLD, T2DM, asthma, COPD, chronic respiratory failure (on supplemental oxygen ), obesity hypoventilation syndrome, multiple lung nodules, LEFT breast cancer, chronic venous insufficiency.  Patient is followed by cardiology (Gollan, MD). She was  last seen in the cardiology clinic on 04/18/2024; notes reviewed. At the time of her clinic visit, patient complaining of chronic dyspnea in the setting of multiple cardiopulmonary diagnoses.  Patient on supplemental oxygen  as managed by pulmonary medicine.  She felt like her shortness of breath was worse due to hot summer weather.  Patient denied any associated chest pain, PND, orthopnea, palpitations, significant peripheral edema, vertiginous symptoms, weakness, fatigue, or presyncope/syncope. Patient with a past medical history significant for cardiovascular diagnoses. Documented physical exam was grossly benign, providing no evidence of acute exacerbation and/or decompensation of the patient's known cardiovascular conditions.  Most recent TTE performed on 09/06/2024 revealed a normal left ventricular systolic function with an EF of 55-60%. There were no regional wall motion abnormalities. Left ventricular diastolic Doppler parameters were normal. GLS -19.1% (normal range <-18%). Right ventricular size and function normal with a TAPSE measuring 2.2 cm  (normal range >/= 1.6 cm). There was no significant valvular regurgitation.  All transvalvular gradients were noted to be normal providing no evidence of hemodynamically significant valvular stenosis. Aorta normal in size with no evidence of ectasia or aneurysmal dilatation.  Blood pressure well controlled at 120/70 mmHg on currently prescribed diuretic (furosemide ), ARB (losartan ), and beta-blocker (metoprolol  succinate) therapies.  Patient is on rosuvastatin  + ezetimibe  for her HLD diagnosis and ASCVD prevention.  T2DM well-controlled on currently prescribed regimen; last HgbA1c was 6.5% when checked on 07/06/2024.  Patient does not have an OSAH diagnosis, however she does have severe COPD and chronic respiratory failure requiring supplemental oxygen  around-the-clock.  She uses 1 L during the day and 2 L/ at bedtime.  Functional capacity limited by  patient's age and multiple medical comorbidities.  She is able to complete all of her ADLs/IADLs without significant cardiovascular limitation.  Per the [facility],  patient unable to complete 4 METS of physical activity without experiencing significant angina/anginal equivalent symptoms.  No changes were made to her medication regimen during her visit with cardiology.  Patient scheduled to follow-up with outpatient cardiology in 12 months or sooner if needed.  Shakti H Bachus underwent biopsy of mass in LEFT breast.  Pathology returned as being positive for (+) stage IA (cT1b, cN0, cM0, G2, ER+, PR+, HER2-) invasive mammary carcinoma.  Patient referred to general surgery for further evaluation and discussions regarding resection.  Patient subsequently scheduled for a BREAST LUMPECTOMY WITH RADIO FREQUENCY LOCALIZER (Left); BIOPSY, LYMPH NODE, SENTINEL, AXILLARY (Left) on 09/15/2024 with Dr. Aloysius Plant, MD. Given patient's past medical history significant for cardiovascular and cardiopulmonary diagnoses, presurgical clearances were sought from patient's primary and pulmonary and cardiology providers by the PAT team.  Specialty clearances were obtained as follows  Per cardiology, EKG shows NSR, no ischemic changes. Monitor volume status closely. METS<4. RCRI = 5% risk of MACE. Based ACC/AHA guidelines, the patient's past medical history, and the amount of time since her last clinic visit, this patient would be at an overall ACCEPTABLE risk for the planned procedure without further cardiovascular testing or intervention at this time.   Per pulmonary medicine, classified as MODERATE surgical risk due to COPD/asthma overlap, obesity with alveolar hypoventilation and oxygen  requirement. Anesthesiologists are experienced and will monitor oxygen  needs post-intubation.  In review of the patient's medication reconciliation, it is noted that she is on daily oral antithrombotic therapy. She has been instructed on  recommendations for holding her daily low-dose ASA for 5 days prior to her procedure with plans to restart as soon as postoperative bleeding risk felt to be minimized by his primary attending surgeon. The patient has been instructed that her last dose should be on 09/09/2024.  Patient denies previous perioperative complications with anesthesia in the past. In review her EMR, it is noted that patient underwent a general anesthetic course here at [facility] (ASA IV) in 12/2015 without documented complications.   MOST RECENT VITAL SIGNS:    09/07/2024    9:45 AM 09/01/2024    1:14 PM 08/30/2024   10:50 AM  Vitals with BMI  Height 5' 6 5' 6 5' 6  Weight 241 lbs 241 lbs 241 lbs 6 oz  BMI 38.92 38.92 38.98  Systolic  122 127  Diastolic  74 75  Pulse  90 85   PROVIDERS/SPECIALISTS: NOTE: Primary physician provider listed below. Patient may have been seen by APP or partner within same practice.   PROVIDER ROLE / SPECIALTY LAST SHERLEAN Plant Aloysius, MD General Surgery (Surgeon) 08/29/2024  Abbey Bruckner, MD Primary Care Provider 08/12/2024  Perla Lye, MD Cardiology 08/30/2024   Tamea Saunas, MD Pulmonary Medicine 09/01/2024  Rennie Lighter, MD Medical Oncology 08/17/2024   ALLERGIES: Allergies[1]  CURRENT HOME MEDICATIONS:  acetaminophen  (TYLENOL ) 500 MG tablet   albuterol  (PROVENTIL ) (2.5 MG/3ML) 0.083% nebulizer solution   albuterol  (VENTOLIN  HFA) 108 (90 Base) MCG/ACT inhaler   aspirin  EC 81 MG tablet   calcium -vitamin D  (OSCAL WITH D) 500-200 MG-UNIT tablet   cetirizine (ZYRTEC) 10 MG tablet   ezetimibe  (ZETIA ) 10 MG tablet   fluticasone  (FLONASE ) 50 MCG/ACT nasal spray   furosemide  (LASIX ) 40 MG tablet   gabapentin  (NEURONTIN ) 100 MG capsule   Glycopyrrolate-Formoterol  (BEVESPI  AEROSPHERE) 9-4.8 MCG/ACT AERO   losartan  (COZAAR ) 25 MG tablet   magnesium oxide (MAG-OX) 400 MG tablet   metFORMIN  (GLUCOPHAGE ) 500 MG tablet  metoprolol  succinate (TOPROL -XL) 25 MG 24 hr tablet   Multiple Vitamin (MULTIVITAMIN) tablet   nystatin  (MYCOSTATIN ) 100000 UNIT/ML suspension   ondansetron  (ZOFRAN ) 4 MG tablet   potassium chloride  (KLOR-CON ) 10 MEQ tablet   potassium chloride  20 MEQ TBCR   rosuvastatin  (CRESTOR ) 5 MG tablet   sodium chloride  (OCEAN) 0.65 % SOLN nasal spray   vitamin C (ASCORBIC ACID) 500 MG tablet   Accu-Chek Softclix Lancets lancets   budesonide  (PULMICORT ) 0.5 MG/2ML nebulizer solution   glucose blood (ACCU-CHEK AVIVA PLUS) test strip   montelukast  (SINGULAIR ) 10 MG tablet   OXYGEN    HISTORY: Past Medical History:  Diagnosis Date   (HFpEF) heart failure with preserved ejection fraction (HCC)    Allergic rhinitis 11/09/2017   Aortic atherosclerosis    Asthma    CAD (coronary artery disease)    Cholelithiasis    Chronic respiratory failure (HCC) 11/13/2016   a.) on supplemental oxygen    Colon polyp 01/01/2016   COPD (chronic obstructive pulmonary disease) (HCC)    Diverticulosis    Hepatic steatosis    HLD (hyperlipidemia)    Hypertension    Lipoma 04/07/2022   Long-term use of aspirin  therapy    Malignant neoplasm of upper-outer quadrant of left breast in female, estrogen receptor positive (HCC) 08/17/2024   Morbid obesity with BMI of 45.0-49.9, adult (HCC)    Multiple lung nodules on CT    Obesity hypoventilation syndrome (HCC)    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   BREAST BIOPSY Left    neg- core   BREAST BIOPSY Left 08/11/2024   US  LT BREAST BX W LOC DEV 1ST LESION IMG BX SPEC US  GUIDE 08/11/2024  [facility]   BREAST BIOPSY Left 09/05/2024   US  LT BREAST SAVI/RF TAG 1ST LESION US  GUIDE 09/05/2024 [facility]   BREAST BIOPSY  09/05/2024   MM LT BREAST SAVI/RF TAG 1ST LESION MAMMO GUIDE 09/05/2024 [facility]   COLONOSCOPY WITH PROPOFOL  N/A 01/01/2016   Procedure: COLONOSCOPY WITH PROPOFOL ;  Surgeon: Rogelia Copping, MD;  Location: [facility] ENDOSCOPY;  Service: Endoscopy;  Laterality: N/A;   RECONSTRUCTION OF EYELID  06/16/2024   thryoid fna Right 12/2013   Proteinaceous material and macrophages.   WISDOM TOOTH EXTRACTION     Family History  Problem Relation Age of Onset   Ovarian cancer Mother 14   Goiter Mother    Uterine cancer Mother 52   Stroke Father    Breast cancer Sister 76   Breast cancer Sister 7   Stroke Brother    Colon cancer Brother        dx 56s   Social History   Tobacco Use   Smoking status: Former    Current packs/day: 0.00    Average packs/day: 3.0 packs/day for 34.0 years (102.0 ttl pk-yrs)    Types: Cigarettes    Start date: 07/11/1979    Quit date: 07/10/2013    Years since quitting: 11.1    Passive exposure: Past   Smokeless tobacco: Never  Substance Use Topics   Alcohol use: No    Alcohol/week: 0.0 standard drinks of alcohol  LABS:  Lab Results  Component Value Date   WBC 5.5 08/15/2024   HGB 14.0 08/15/2024   HCT 43.1 08/15/2024   MCV 86.3 08/15/2024   PLT 256.0 08/15/2024   Lab Results  Component Value Date   NA 144 07/06/2024   CL 100 07/06/2024   K 4.2 07/06/2024   CO2 33 (H) 07/06/2024   BUN 14 07/06/2024   CREATININE 0.59 07/06/2024   GFR 98.37 07/06/2024   CALCIUM  10.2 07/06/2024   ALBUMIN  4.4 08/15/2024   GLUCOSE 94 07/06/2024    ECG: Date: 08/30/2024 Time ECG obtained: 1057 AM Rate: 85 bpm Rhythm: normal sinus Axis (leads I and aVF): normal Intervals: PR 176 ms. QRS 96 ms. QTc 435 ms. ST segment and T wave changes: No evidence of acute T wave abnormalities or significant ST segment elevation or  depression.  Evidence of a possible, age undetermined, prior infarct:  Yes; anterior Comparison: Similar to previous tracing obtained on 04/18/2024   IMAGING / PROCEDURES: TRANSTHORACIC ECHOCARDIOGRAM performed on 09/06/2024 Left ventricular ejection fraction, by estimation, is 55 to 60%. Left ventricular ejection fraction by 3D volume is 55 %. The left ventricle has  normal function. Left ventricular endocardial border not optimally defined to evaluate regional wall motion. Left ventricular diastolic parameters were normal. The average left ventricular global longitudinal strain is -19.1 %. The global longitudinal strain is normal.  Right ventricular systolic function is normal. The right ventricular size is normal.  The mitral valve is normal in structure. No evidence of mitral valve regurgitation. No evidence of mitral stenosis.  The aortic valve has an indeterminant number of cusps. Aortic valve regurgitation is not visualized. No aortic stenosis is present.   PULMONARY FUNCTION TESTING performed on 03/16/2024    Latest Ref Rng & Units 03/16/2024   10:31 AM 01/21/2016    1:39 PM  PFT Results  FVC-Pre L 2.12  2.44  P  FVC-Predicted Pre % 59  64  P  FVC-Post L 2.30  2.56  P  FVC-Predicted Post % 64  68  P  Pre FEV1/FVC % % 65  62  P  Post FEV1/FCV % % 66  63  P  FEV1-Pre L 1.37  1.51  P  FEV1-Predicted Pre % 49  50  P  FEV1-Post L 1.51  1.61  P  DLCO uncorrected ml/min/mmHg 22.75  36.00  P  DLCO UNC% % 105  133  P  DLVA Predicted % 148  100  P  TLC L 5.80    TLC % Predicted % 108    RV % Predicted % 173     CT CHEST LCS NODULE F/U LOW DOSE WO CONTRAST performed on 07/16/2022 Lung-RADS 3, probably benign findings. Short-term follow-up in 6 months is recommended with repeat low-dose chest CT without contrast Solitary new 4.8 mm solid right upper lobe pulmonary nodule. Three-vessel coronary atherosclerosis. Diffuse hepatic steatosis. Cholelithiasis. Left colonic  diverticulosis. Aortic atherosclerosis  Emphysema  IMPRESSION AND PLAN: Marly Schuld Moncada has been referred for pre-anesthesia review and clearance prior to her undergoing the planned anesthetic and procedural courses. Available labs, pertinent testing, and imaging results were personally reviewed by me in preparation for upcoming operative/procedural course. [facility] medical record has been updated following extensive record review and patient interview with PAT staff.   This patient has been appropriately cleared by cardiology (ACCEPTABLE) and by pulmonary medicine (MODERATE) with the individually indicated risks of patient experiencing significant perioperative cardiovascular/cardiopulmonary complications. [facility] at [facility]. Based on clinical review performed today (09/12/2024), barring any significant acute changes in the patient's overall condition, it is anticipated that she will be able to proceed with the planned surgical intervention. Any acute changes in clinical condition may necessitate her procedure being postponed and/or cancelled. Patient will meet with anesthesia team (MD and/or CRNA) on the day of her procedure for preoperative evaluation/assessment. Questions regarding anesthetic course will be fielded at that time.   Pre-surgical instructions were reviewed with the patient during his PAT appointment, and questions were fielded to satisfaction by PAT clinical staff. She has been instructed on which medications that she will need to hold prior to surgery, as well as the ones that have been deemed safe/appropriate to take on the day of her procedure. As part of the general education provided by PAT, patient made aware both verbally and in writing, that she would need to abstain from the use of any illegal substances during her perioperative course. She was advised that failure to follow the provided instructions could necessitate case cancellation or result  in serious perioperative complications up to and including death. Patient encouraged to contact PAT and/or her surgeon's office to discuss any questions or concerns that may arise prior to surgery; verbalized understanding.   Dorise Pereyra, MSN, APRN, FNP-C, CEN [facility]  Perioperative Services Nurse Practitioner Phone: [phone] Fax: [phone] 09/12/2024 10:10 AM  NOTE: This note has been prepared using Dragon dictation software. Despite my best ability to proofread, there is always the potential that unintentional transcriptional errors may still occur from this process.     [1]  Allergies Allergen Reactions   Amoxicillin -Pot Clavulanate Itching and Hives    Hives  Can take PCN and Amoxicillin  alone

## 2024-09-12 NOTE — Telephone Encounter (Signed)
° °  Patient Name: Martha Woodard  DOB: 1965/11/15 MRN: [id number]  Primary Cardiologist: None  Chart reviewed as part of pre-operative protocol coverage. Given past medical history and time since last visit, based on ACC/AHA guidelines, [name] is at acceptable risk for the planned procedure without further cardiovascular testing.   The patient was advised that if she develops new symptoms prior to surgery to contact our office to arrange for a follow-up visit, and she verbalized understanding.  I will route this recommendation to the requesting party via Epic fax function and remove from pre-op pool.  Please call with questions.  Lamarr Satterfield, NP 09/12/2024, 3:28 PM

## 2024-09-12 NOTE — Telephone Encounter (Signed)
 [facility] calling in for follow up. Fax: [phone]

## 2024-09-13 ENCOUNTER — Other Ambulatory Visit: Payer: Self-pay | Admitting: Radiology

## 2024-09-13 ENCOUNTER — Inpatient Hospital Stay

## 2024-09-13 NOTE — Progress Notes (Unsigned)
 Cardiology clearance has been received from Lamarr Satterfield, NP at [facility]. Given past medical history and time since last visit, based on ACC/AHA guidelines, Martha Woodard is at acceptable risk for the planned procedure without further cardiovascular testing. All notes are in Epic.

## 2024-09-13 NOTE — Progress Notes (Signed)
 [facility] Healthcare Advance Directives Clinical Social Work  Patient presented to Advance Directives Clinic  to review and complete healthcare advance directives.  Clinical Social Worker met with patient.  The patient designated [name] as their primary healthcare agent.  Patient also completed healthcare living will.    Documents were notarized and copies made for patient/family. Clinical Social Worker will send documents to medical records to be scanned into patient's chart. Clinical Social Worker encouraged patient/family to contact with any additional questions or concerns.   [name], LCSW Clinical Social Worker [facility]

## 2024-09-14 ENCOUNTER — Inpatient Hospital Stay

## 2024-09-14 DIAGNOSIS — C50412 Malignant neoplasm of upper-outer quadrant of left female breast: Secondary | ICD-10-CM

## 2024-09-14 NOTE — Progress Notes (Signed)
 Multidisciplinary Oncology Council Documentation  Martha Woodard was presented by our [facility] on 09/14/2024, which included representatives from:  Palliative Care Dietitian  Physical/Occupational Therapist Nurse Navigator Genetics Social work Survivorship RN Financial Navigator Research RN   Martha Woodard currently presents with history of breast cancer  We reviewed previous medical and familial history, history of present illness, and recent lab results along with all available histopathologic and imaging studies. The MOC considered available treatment options and made the following recommendations/referrals:  None currently  The MOC is a meeting of clinicians from various specialty areas who evaluate and discuss patients for whom a multidisciplinary approach is being considered. Final determinations in the plan of care are those of the provider(s).   Today's extended care, comprehensive team conference, Martha Woodard was not present for the discussion and was not examined.

## 2024-09-15 ENCOUNTER — Encounter: Admitting: Urgent Care

## 2024-09-15 ENCOUNTER — Encounter: Payer: Self-pay | Admitting: Surgery

## 2024-09-15 ENCOUNTER — Encounter

## 2024-09-15 ENCOUNTER — Ambulatory Visit: Admission: RE | Admit: 2024-09-15

## 2024-09-15 ENCOUNTER — Other Ambulatory Visit: Payer: Self-pay

## 2024-09-15 ENCOUNTER — Ambulatory Visit
Admission: RE | Admit: 2024-09-15 | Discharge: 2024-09-15 | Disposition: A | Discharge status: Home / Self Care | Attending: Surgery | Admitting: Surgery

## 2024-09-15 ENCOUNTER — Ambulatory Visit: Admitting: Urgent Care

## 2024-09-15 ENCOUNTER — Encounter: Admission: RE | Disposition: A | Payer: Self-pay | Attending: Surgery

## 2024-09-15 ENCOUNTER — Inpatient Hospital Stay: Admission: RE | Admit: 2024-09-15 | Discharge: 2024-09-15 | Attending: Surgery

## 2024-09-15 DIAGNOSIS — Z87891 Personal history of nicotine dependence: Secondary | ICD-10-CM | POA: Diagnosis not present

## 2024-09-15 DIAGNOSIS — E1140 Type 2 diabetes mellitus with diabetic neuropathy, unspecified: Secondary | ICD-10-CM

## 2024-09-15 DIAGNOSIS — I5032 Chronic diastolic (congestive) heart failure: Secondary | ICD-10-CM | POA: Insufficient documentation

## 2024-09-15 DIAGNOSIS — E785 Hyperlipidemia, unspecified: Secondary | ICD-10-CM | POA: Insufficient documentation

## 2024-09-15 DIAGNOSIS — E119 Type 2 diabetes mellitus without complications: Secondary | ICD-10-CM | POA: Diagnosis not present

## 2024-09-15 DIAGNOSIS — J9612 Chronic respiratory failure with hypercapnia: Secondary | ICD-10-CM | POA: Insufficient documentation

## 2024-09-15 DIAGNOSIS — Z01812 Encounter for preprocedural laboratory examination: Secondary | ICD-10-CM

## 2024-09-15 DIAGNOSIS — Z803 Family history of malignant neoplasm of breast: Secondary | ICD-10-CM | POA: Insufficient documentation

## 2024-09-15 DIAGNOSIS — C50919 Malignant neoplasm of unspecified site of unspecified female breast: Secondary | ICD-10-CM

## 2024-09-15 DIAGNOSIS — N6489 Other specified disorders of breast: Secondary | ICD-10-CM | POA: Diagnosis not present

## 2024-09-15 DIAGNOSIS — I110 Hypertensive heart disease with heart failure: Secondary | ICD-10-CM | POA: Diagnosis not present

## 2024-09-15 DIAGNOSIS — I872 Venous insufficiency (chronic) (peripheral): Secondary | ICD-10-CM | POA: Diagnosis not present

## 2024-09-15 DIAGNOSIS — E662 Morbid (severe) obesity with alveolar hypoventilation: Secondary | ICD-10-CM | POA: Insufficient documentation

## 2024-09-15 DIAGNOSIS — I2510 Atherosclerotic heart disease of native coronary artery without angina pectoris: Secondary | ICD-10-CM | POA: Diagnosis not present

## 2024-09-15 DIAGNOSIS — D242 Benign neoplasm of left breast: Secondary | ICD-10-CM | POA: Diagnosis not present

## 2024-09-15 DIAGNOSIS — J4489 Other specified chronic obstructive pulmonary disease: Secondary | ICD-10-CM | POA: Diagnosis not present

## 2024-09-15 DIAGNOSIS — Z9981 Dependence on supplemental oxygen: Secondary | ICD-10-CM | POA: Diagnosis not present

## 2024-09-15 DIAGNOSIS — Z170 Estrogen receptor positive status [ER+]: Secondary | ICD-10-CM | POA: Diagnosis present

## 2024-09-15 DIAGNOSIS — E1159 Type 2 diabetes mellitus with other circulatory complications: Secondary | ICD-10-CM

## 2024-09-15 DIAGNOSIS — Z780 Asymptomatic menopausal state: Secondary | ICD-10-CM | POA: Insufficient documentation

## 2024-09-15 DIAGNOSIS — Z6841 Body Mass Index (BMI) 40.0 and over, adult: Secondary | ICD-10-CM | POA: Diagnosis not present

## 2024-09-15 DIAGNOSIS — C50412 Malignant neoplasm of upper-outer quadrant of left female breast: Secondary | ICD-10-CM | POA: Insufficient documentation

## 2024-09-15 HISTORY — DX: Atherosclerosis of aorta: I70.0

## 2024-09-15 HISTORY — DX: Long term (current) use of aspirin: Z79.82

## 2024-09-15 HISTORY — DX: Morbid (severe) obesity with alveolar hypoventilation: E66.2

## 2024-09-15 HISTORY — DX: Dependence on supplemental oxygen: Z99.81

## 2024-09-15 HISTORY — PX: BREAST LUMPECTOMY WITH RADIO FREQUENCY LOCALIZER: SHX6897

## 2024-09-15 HISTORY — DX: Calculus of gallbladder without cholecystitis without obstruction: K80.20

## 2024-09-15 HISTORY — DX: Atherosclerotic heart disease of native coronary artery without angina pectoris: I25.10

## 2024-09-15 HISTORY — DX: Diverticulosis of intestine, part unspecified, without perforation or abscess without bleeding: K57.90

## 2024-09-15 HISTORY — PX: AXILLARY SENTINEL NODE BIOPSY: SHX5738

## 2024-09-15 HISTORY — DX: Fatty (change of) liver, not elsewhere classified: K76.0

## 2024-09-15 HISTORY — DX: Type 2 diabetes mellitus without complications: E11.9

## 2024-09-15 HISTORY — DX: Unspecified diastolic (congestive) heart failure: I50.30

## 2024-09-15 LAB — GLUCOSE, CAPILLARY
Glucose-Capillary: 99 mg/dL (ref 70–99)
Glucose-Capillary: 152 mg/dL — ABNORMAL HIGH (ref 70–99)

## 2024-09-15 SURGERY — BREAST LUMPECTOMY WITH RADIO FREQUENCY LOCALIZER
Anesthesia: General | Laterality: Left

## 2024-09-15 MED ORDER — DROPERIDOL 2.5 MG/ML IJ SOLN
INTRAMUSCULAR | Status: AC
  Filled 2024-09-15: qty 2

## 2024-09-15 MED ORDER — ACETAMINOPHEN 500 MG PO TABS: 1000.0000 mg | ORAL_TABLET | Freq: Four times a day (QID) | ORAL | Status: DC | PRN

## 2024-09-15 MED ORDER — ALBUMIN HUMAN 5 % IV SOLN
INTRAVENOUS | Status: AC
  Filled 2024-09-15: qty 250

## 2024-09-15 MED ORDER — ONDANSETRON HCL 4 MG/2ML IJ SOLN
INTRAMUSCULAR | Status: AC
  Filled 2024-09-15: qty 2

## 2024-09-15 MED ORDER — ACETAMINOPHEN 500 MG PO TABS
1000.0000 mg | ORAL_TABLET | ORAL | Status: AC
  Administered 2024-09-15: 11:00:00 1000 mg via ORAL

## 2024-09-15 MED ORDER — SODIUM CHLORIDE (PF) 0.9 % IJ SOLN
INTRAMUSCULAR | Status: AC
  Filled 2024-09-15: qty 10

## 2024-09-15 MED ORDER — MIDAZOLAM HCL 2 MG/2ML IJ SOLN
INTRAMUSCULAR | Status: AC
  Filled 2024-09-15: qty 2

## 2024-09-15 MED ORDER — METHYLENE BLUE 20 MG/2ML IV SOSY
PREFILLED_SYRINGE | INTRAVENOUS | Status: DC | PRN
  Administered 2024-09-15: 12:00:00 5 mL

## 2024-09-15 MED ORDER — ONDANSETRON HCL 4 MG/2ML IJ SOLN
INTRAMUSCULAR | Status: DC | PRN
  Administered 2024-09-15: 12:00:00 4 mg via INTRAVENOUS

## 2024-09-15 MED ORDER — FENTANYL CITRATE (PF) 100 MCG/2ML IJ SOLN
INTRAMUSCULAR | Status: AC
  Filled 2024-09-15: qty 2

## 2024-09-15 MED ORDER — BUPIVACAINE-EPINEPHRINE (PF) 0.5% -1:200000 IJ SOLN
INTRAMUSCULAR | Status: AC
  Filled 2024-09-15: qty 30

## 2024-09-15 MED ORDER — CEFAZOLIN SODIUM-DEXTROSE 2-4 GM/100ML-% IV SOLN
2.0000 g | INTRAVENOUS | Status: AC
  Administered 2024-09-15: 12:00:00 2 g via INTRAVENOUS

## 2024-09-15 MED ORDER — GABAPENTIN 300 MG PO CAPS
300.0000 mg | ORAL_CAPSULE | ORAL | Status: AC
  Administered 2024-09-15: 11:00:00 300 mg via ORAL

## 2024-09-15 MED ORDER — CHLORHEXIDINE GLUCONATE 0.12 % MT SOLN
15.0000 mL | Freq: Once | OROMUCOSAL | Status: AC
  Administered 2024-09-15: 11:00:00 15 mL via OROMUCOSAL

## 2024-09-15 MED ORDER — FENTANYL CITRATE (PF) 100 MCG/2ML IJ SOLN
INTRAMUSCULAR | Status: DC | PRN
  Administered 2024-09-15 (×6): 50 ug via INTRAVENOUS

## 2024-09-15 MED ORDER — OXYCODONE HCL 5 MG PO TABS
5.0000 mg | ORAL_TABLET | Freq: Once | ORAL | Status: AC | PRN
  Administered 2024-09-15: 15:00:00 5 mg via ORAL

## 2024-09-15 MED ORDER — CHLORHEXIDINE GLUCONATE 0.12 % MT SOLN
OROMUCOSAL | Status: AC
  Filled 2024-09-15: qty 15

## 2024-09-15 MED ORDER — CEFAZOLIN SODIUM-DEXTROSE 2-4 GM/100ML-% IV SOLN
INTRAVENOUS | Status: AC
  Filled 2024-09-15: qty 100

## 2024-09-15 MED ORDER — IBUPROFEN 600 MG PO TABS: 600.0000 mg | ORAL_TABLET | Freq: Three times a day (TID) | ORAL | 1 refills | Status: DC | PRN

## 2024-09-15 MED ORDER — OXYCODONE HCL 5 MG PO TABS
ORAL_TABLET | ORAL | Status: AC
  Filled 2024-09-15: qty 1

## 2024-09-15 MED ORDER — FENTANYL CITRATE (PF) 100 MCG/2ML IJ SOLN
25.0000 ug | INTRAMUSCULAR | Status: DC | PRN
  Administered 2024-09-15 (×2): 25 ug via INTRAVENOUS

## 2024-09-15 MED ORDER — OXYCODONE HCL 5 MG/5ML PO SOLN: 5.0000 mg | Freq: Once | ORAL | Status: AC | PRN

## 2024-09-15 MED ORDER — GABAPENTIN 300 MG PO CAPS
ORAL_CAPSULE | ORAL | Status: AC
  Filled 2024-09-15: qty 1

## 2024-09-15 MED ORDER — PROPOFOL 10 MG/ML IV BOLUS
INTRAVENOUS | Status: AC
  Filled 2024-09-15: qty 20

## 2024-09-15 MED ORDER — CHLORHEXIDINE GLUCONATE CLOTH 2 % EX PADS
6.0000 | MEDICATED_PAD | Freq: Once | CUTANEOUS | Status: AC
  Administered 2024-09-15: 11:00:00 6 via TOPICAL

## 2024-09-15 MED ORDER — METHYLENE BLUE (ANTIDOTE) 1 % IV SOLN
INTRAVENOUS | Status: AC
  Filled 2024-09-15: qty 10

## 2024-09-15 MED ORDER — ACETAMINOPHEN 10 MG/ML IV SOLN: 1000.0000 mg | Freq: Once | INTRAVENOUS | Status: DC | PRN

## 2024-09-15 MED ORDER — TECHNETIUM TC 99M TILMANOCEPT KIT
0.9890 | PACK | Freq: Once | INTRAVENOUS | Status: AC | PRN
  Administered 2024-09-15: 11:00:00 0.989 via INTRADERMAL

## 2024-09-15 MED ORDER — BUPIVACAINE-EPINEPHRINE 0.5% -1:200000 IJ SOLN
INTRAMUSCULAR | Status: DC | PRN
  Administered 2024-09-15: 13:00:00 10 mL

## 2024-09-15 MED ORDER — ORAL CARE MOUTH RINSE: 15.0000 mL | Freq: Once | OROMUCOSAL | Status: AC

## 2024-09-15 MED ORDER — PROPOFOL 10 MG/ML IV BOLUS
INTRAVENOUS | Status: DC | PRN
  Administered 2024-09-15: 12:00:00 200 mg via INTRAVENOUS

## 2024-09-15 MED ORDER — BUPIVACAINE-EPINEPHRINE 0.5% -1:200000 IJ SOLN
INTRAMUSCULAR | Status: DC | PRN
  Administered 2024-09-15: 14:00:00 20 mL via INTRAMUSCULAR

## 2024-09-15 MED ORDER — LIDOCAINE HCL (CARDIAC) PF 100 MG/5ML IV SOSY
PREFILLED_SYRINGE | INTRAVENOUS | Status: DC | PRN
  Administered 2024-09-15: 12:00:00 60 mg via INTRAVENOUS

## 2024-09-15 MED ORDER — DROPERIDOL 2.5 MG/ML IJ SOLN
0.6250 mg | Freq: Once | INTRAMUSCULAR | Status: AC | PRN
  Administered 2024-09-15: 16:00:00 0.625 mg via INTRAVENOUS

## 2024-09-15 MED ORDER — ACETAMINOPHEN 500 MG PO TABS
ORAL_TABLET | ORAL | Status: AC
  Filled 2024-09-15: qty 2

## 2024-09-15 MED ORDER — SODIUM CHLORIDE 0.9 % IV SOLN: INTRAVENOUS | Status: DC

## 2024-09-15 MED ORDER — CHLORHEXIDINE GLUCONATE CLOTH 2 % EX PADS: 6.0000 | MEDICATED_PAD | Freq: Once | CUTANEOUS | Status: DC

## 2024-09-15 MED ORDER — MIDAZOLAM HCL (PF) 2 MG/2ML IJ SOLN
INTRAMUSCULAR | Status: DC | PRN
  Administered 2024-09-15: 12:00:00 2 mg via INTRAVENOUS

## 2024-09-15 MED ORDER — OXYCODONE HCL 5 MG PO TABS: 5.0000 mg | ORAL_TABLET | ORAL | 0 refills | Status: DC | PRN

## 2024-09-15 MED ORDER — LIDOCAINE HCL (PF) 2 % IJ SOLN
INTRAMUSCULAR | Status: AC
  Filled 2024-09-15: qty 5

## 2024-09-15 MED ADMIN — Dexamethasone Sod Phosphate Preservative Free Inj 10 MG/ML: 4 mg | INTRAVENOUS | @ 12:00:00 | NDC 70069002101

## 2024-09-15 SURGICAL SUPPLY — 36 items
BINDER BREAST LRG (GAUZE/BANDAGES/DRESSINGS) IMPLANT
BINDER BREAST MEDIUM (GAUZE/BANDAGES/DRESSINGS) IMPLANT
BLADE PHOTON 3 ILLUMINATED (MISCELLANEOUS) ×1 IMPLANT
BLADE SURG 15 STRL LF DISP TIS (BLADE) ×1 IMPLANT
BRA SURGICAL 2XLRG (MISCELLANEOUS) IMPLANT
CHLORAPREP W/TINT 26 (MISCELLANEOUS) IMPLANT
CLIP APPLIE 9.375 SM OPEN (CLIP) IMPLANT
COVER PROBE GAMMA FINDER SLV (MISCELLANEOUS) ×1 IMPLANT
DERMABOND ADVANCED .7 DNX12 (GAUZE/BANDAGES/DRESSINGS) ×1 IMPLANT
DEVICE DUBIN SPECIMEN MAMMOGRA (MISCELLANEOUS) ×1 IMPLANT
DRAPE LAPAROTOMY 100X77 ABD (DRAPES) ×1 IMPLANT
DRSG GAUZE FLUFF 36X18 (GAUZE/BANDAGES/DRESSINGS) ×1 IMPLANT
ELECTRODE REM PT RTRN 9FT ADLT (ELECTROSURGICAL) ×1 IMPLANT
GAUZE 4X4 16PLY ~~LOC~~+RFID DBL (SPONGE) IMPLANT
GLOVE SURG SYN 7.0 PF PI (GLOVE) ×1 IMPLANT
GLOVE SURG SYN 7.5 PF PI (GLOVE) ×1 IMPLANT
GOWN STRL REUS W/ TWL LRG LVL3 (GOWN DISPOSABLE) ×2 IMPLANT
KIT MARKER MARGIN INK (KITS) IMPLANT
KIT TURNOVER KIT A (KITS) ×1 IMPLANT
LABEL OR SOLS (LABEL) ×1 IMPLANT
MANIFOLD NEPTUNE II (INSTRUMENTS) ×1 IMPLANT
NDL HYPO 22X1.5 SAFETY MO (MISCELLANEOUS) ×1 IMPLANT
NDL SAFETY ECLIP 18X1.5 (MISCELLANEOUS) ×1 IMPLANT
NEEDLE HYPO 22X1.5 SAFETY MO (MISCELLANEOUS) ×1 IMPLANT
PACK BASIN MINOR ARMC (MISCELLANEOUS) ×1 IMPLANT
SHEATH BREAST BIOPSY SKIN MKR (SHEATH) ×1 IMPLANT
SOLN STERILE WATER 500 ML (IV SOLUTION) ×1 IMPLANT
SOLN STERILE WATER BTL 1000 ML (IV SOLUTION) ×1 IMPLANT
SUT SILK 3 0 SH 30 (SUTURE) IMPLANT
SUT VIC AB 3-0 SH 27X BRD (SUTURE) ×1 IMPLANT
SUTURE EHLN 3-0 FS-10 30 BLK (SUTURE) IMPLANT
SUTURE MNCRL 4-0 27XMF (SUTURE) ×1 IMPLANT
SYR 10ML LL (SYRINGE) ×1 IMPLANT
TAPE TRANSPORE STRL 2 31045 (GAUZE/BANDAGES/DRESSINGS) IMPLANT
TRAP FLUID SMOKE EVACUATOR (MISCELLANEOUS) ×1 IMPLANT
TRAP NEPTUNE SPECIMEN COLLECT (MISCELLANEOUS) ×1 IMPLANT

## 2024-09-15 NOTE — Transfer of Care (Addendum)
 Immediate Anesthesia Transfer of Care Note  Patient: Martha Woodard  Procedure(s) Performed: BREAST LUMPECTOMY WITH RADIO FREQUENCY LOCALIZER (Left) BIOPSY, LYMPH NODE, SENTINEL, AXILLARY (Left)  Patient Location: PACU  Anesthesia Type:General  Level of Consciousness: drowsy  Airway & Oxygen  Therapy: Patient Spontanous Breathing and Patient connected to face mask oxygen   Post-op Assessment: Report given to RN, Post -op Vital signs reviewed and stable, and Patient moving all extremities X 4  Post vital signs: Reviewed and stable  Last Vitals:  Vitals Value Taken Time  BP 163/90 09/15/24 14:36  Temp    Pulse 74 09/15/24 14:37  Resp 17 09/15/24 14:37  SpO2 100 % 09/15/24 14:37  Vitals shown include unfiled device data.  Last Pain:  Vitals:   09/15/24 1124  PainSc: 0-No pain         Complications: No notable events documented.

## 2024-09-15 NOTE — Anesthesia Preprocedure Evaluation (Signed)
 Anesthesia Evaluation  Patient identified by MRN, date of birth, ID band Patient awake    Reviewed: Allergy & Precautions, H&P , NPO status , Patient's Chart, lab work & pertinent test results, reviewed documented beta blocker date and time   Airway Mallampati: II  TM Distance: >3 FB Neck ROM: full    Dental  (+) Teeth Intact   Pulmonary neg pulmonary ROS, former smoker   Pulmonary exam normal        Cardiovascular Exercise Tolerance: Poor hypertension, On Medications + CAD  Normal cardiovascular exam Rate:Normal     Neuro/Psych negative neurological ROS  negative psych ROS   GI/Hepatic negative GI ROS, Neg liver ROS,,,  Endo/Other  negative endocrine ROSdiabetes, Well Controlled    Renal/GU negative Renal ROS  negative genitourinary   Musculoskeletal   Abdominal   Peds  Hematology negative hematology ROS (+)   Anesthesia Other Findings   Reproductive/Obstetrics negative OB ROS                              Anesthesia Physical Anesthesia Plan  ASA: 3  Anesthesia Plan: General LMA and General ETT   Post-op Pain Management:    Induction:   PONV Risk Score and Plan: 4 or greater  Airway Management Planned:   Additional Equipment:   Intra-op Plan:   Post-operative Plan:   Informed Consent: I have reviewed the patients History and Physical, chart, labs and discussed the procedure including the risks, benefits and alternatives for the proposed anesthesia with the patient or authorized representative who has indicated his/her understanding and acceptance.       Plan Discussed with: CRNA  Anesthesia Plan Comments:         Anesthesia Quick Evaluation

## 2024-09-15 NOTE — Discharge Instructions (Signed)
 Discharge Instructions: 1.  Patient may shower, but do not scrub wounds heavily and dab dry only. 2.  Do not submerge wounds in pool/tub until fully healed. 3.  Do not apply ointments or hydrogen peroxide to the wounds. 4.  May apply ice packs to the wounds for comfort. 5.  Please wear breast binder at all times for the next two weeks.  May remove for showers/washing.  If cannot tolerate the breast binder, then please at least wear a tighter sports bra to help with compression/support of the left breast. 6.  May apply fluffed gauze over the axilla and breast incisions to help with comfort/padding. 7.  May resume your Aspirin  on 09/17/24. 8.  Do not drive while taking narcotics for pain control.  Prior to driving, make sure you are able to rotate right and left to look at blindspots without significant pain or discomfort. 9.  Avoid strenuous activity with the left arm for two weeks.

## 2024-09-15 NOTE — Op Note (Signed)
 Procedure Date:  09/15/2024  Pre-operative Diagnosis:  Left breast cancer  Post-operative Diagnosis: Left breast cancer  Procedure:  Left breast SAVI tag-localized lumpectomy and sentinel lymph node biopsy  Surgeon:  Aloysius Sheree Plant, MD  Anesthesia:  General endotracheal  Estimated Blood Loss:  10 ml  Specimens:   Sentinel lymph node #1, count 859 Sentinel lymph node #2, no count Left breast mass -- two clips and tags  Complications:  None  Operation performed with curative intent:Yes  Tracer(s) used to identify sentinel nodes in the upfront surgery (non-neoadjuvant) setting (select all that apply):Dye and Radioactive Tracer  Tracer(s) used to identify sentinel nodes in the neoadjuvant setting (select all that apply):N/A  All nodes (colored or non-colored) present at the end of a dye-filled lymphatic channel were removed:N/A  All significantly radioactive nodes were removed:Yes  All palpable suspicious nodes were removed:N/A  Biopsy-proven positive nodes marked with clips prior to chemotherapy were identified and removed:N/A  Indications for Procedure:  This is a 57 y.o. female who presents with left breast cancer.  The risks of bleeding, infection, injury to surrounding structures, hematoma, seroma, open wound, cosmetic deformity, and the need for further surgery were all discussed with the patient and was willing to proceed.  Prior to this procedure, the patient had undergone SAVI tag localization and sentinel lymphoscintigraphy.  The patient's initial tag had become displaced, so a second tag was placed to localize the mass.  Description of Procedure: The patient was correctly identified in the preoperative area and brought into the operating room.  The patient was placed supine with VTE prophylaxis in place.  Appropriate time-outs were performed.  Anesthesia was induced and the patient was intubated.  Appropriate antibiotics were infused.  A visual dye was injected in  the left periareolar region under aseptic conditions. The left chest and axilla were prepped and draped in usual sterile fashion.  Then using the hand-held probe an area of high counts was identified in the axilla, and a 5 cm incision was made.  Cautery was used to dissect down the subcutaneous tissue and the hand-held probe was used to guide dissection. A hot lymph node was identified and resected.  This had a count of 859.  There was no blue dye within the node, and no blue lymphatic pathways were seen.  An adjacent lymph nodes was also found but it did not have any significant counts.  No other palpable lymph nodes were noted.  The cavity was irrigated and hemostasis was assured with electrocautery.  Local anesthetic was infiltrated into the skin and subcutaneous tissue of the cavity.  The wound was then closed in layers with 3-0 Vicryl and 4-0 Monocryl and sealed with DermaBond.  Attention was turned to the SAVI tag localization site which was determined using the scout probe.  A 5 cm incision was made overlying the tag and clip in the upper outer quadrant.  Scout probe was used to guide our dissection using electrocautery, and a partial mastectomy was performed with adequate margins.  MarginMarker was used to ink each of the sides of the specimen.  The specimen was then imaged to confirm that the area of concern, biopsy clip, both SAVI tags, and an incidental clip from prior benign biopsy were included in the excision.  This was then sent to pathology.  The cavity was irrigated and hemostasis was assured with electrocautery.  Local anesthetic was infiltrated into the skin and subcutaneous tissue of the cavity.  The wound was then closed in  layers with 3-0 Vicryl and 4-0 Monocryl and sealed with DermaBond.  The patient was emerged from anesthesia and extubated and brought to the recovery room for further management.  The patient tolerated the procedure well and all counts were correct at the end of the  case.   Aloysius Sheree Plant, MD

## 2024-09-15 NOTE — Interval H&P Note (Signed)
 History and Physical Interval Note:  09/15/2024 11:27 AM  Martha Woodard  has presented today for surgery, with the diagnosis of left breast cancer.  The various methods of treatment have been discussed with the patient and family. After consideration of risks, benefits and other options for treatment, the patient has consented to  Procedures: BREAST LUMPECTOMY WITH RADIO FREQUENCY LOCALIZER (Left) BIOPSY, LYMPH NODE, SENTINEL, AXILLARY (Left) as a surgical intervention.  The patient's history has been reviewed, patient examined, no change in status, stable for surgery.  I have reviewed the patient's chart and labs.  Questions were answered to the patient's satisfaction.     Gissell Barra

## 2024-09-15 NOTE — Anesthesia Procedure Notes (Signed)
 Procedure Name: LMA Insertion Date/Time: 09/15/2024 12:08 PM  Performed by: Brandy Almarie BROCKS, CRNAPre-anesthesia Checklist: Emergency Drugs available, Patient identified, Suction available and Patient being monitored Patient Re-evaluated:Patient Re-evaluated prior to induction Oxygen  Delivery Method: Circle system utilized Preoxygenation: Pre-oxygenation with 100% oxygen  Induction Type: IV induction LMA: LMA inserted LMA Size: 4.0 Number of attempts: 1 Dental Injury: Teeth and Oropharynx as per pre-operative assessment

## 2024-09-16 ENCOUNTER — Encounter: Payer: Self-pay | Admitting: Surgery

## 2024-09-16 ENCOUNTER — Other Ambulatory Visit: Payer: Self-pay | Admitting: Pathology

## 2024-09-16 ENCOUNTER — Ambulatory Visit
Admission: RE | Admit: 2024-09-16 | Discharge: 2024-09-16 | Disposition: A | Discharge status: Home / Self Care | Source: Ambulatory Visit | Attending: Pathology | Admitting: Pathology

## 2024-09-16 DIAGNOSIS — C50919 Malignant neoplasm of unspecified site of unspecified female breast: Secondary | ICD-10-CM

## 2024-09-17 NOTE — Anesthesia Postprocedure Evaluation (Signed)
"   Anesthesia Post Note  Patient: Ilynn H Patch  Procedure(s) Performed: BREAST LUMPECTOMY WITH RADIO FREQUENCY LOCALIZER (Left) BIOPSY, LYMPH NODE, SENTINEL, AXILLARY (Left)  Patient location during evaluation: PACU Anesthesia Type: General Level of consciousness: awake and alert Pain management: pain level controlled Vital Signs Assessment: post-procedure vital signs reviewed and stable Respiratory status: spontaneous breathing, nonlabored ventilation, respiratory function stable and patient connected to nasal cannula oxygen  Cardiovascular status: blood pressure returned to baseline and stable Postop Assessment: no apparent nausea or vomiting Anesthetic complications: no   No notable events documented.   Last Vitals:  Vitals:   09/15/24 1600 09/15/24 1613  BP: (!) 148/73 (!) 148/70  Pulse: 89 88  Resp: 16 18  Temp:  37.1 C  SpO2: 95% 93%    Last Pain:  Vitals:   09/15/24 1613  TempSrc: Temporal  PainSc: 0-No pain                 Prentice Murphy      "

## 2024-09-19 ENCOUNTER — Encounter: Payer: Self-pay | Admitting: *Deleted

## 2024-09-19 ENCOUNTER — Other Ambulatory Visit: Payer: Self-pay | Admitting: Pathology

## 2024-09-19 LAB — SURGICAL PATHOLOGY

## 2024-09-19 NOTE — Progress Notes (Signed)
 Per Dr. Rennie, oncotype Dx not needed at this time.   Martha Woodard is scheduled to see Dr. Rennie and Dr. Lenn on 10/10/24.

## 2024-09-19 NOTE — Telephone Encounter (Signed)
 Path d/w pt in detail. She is doing well w/o issues. She is very adult nurse

## 2024-09-21 ENCOUNTER — Encounter

## 2024-10-03 ENCOUNTER — Ambulatory Visit (INDEPENDENT_AMBULATORY_CARE_PROVIDER_SITE_OTHER): Admitting: Surgery

## 2024-10-03 ENCOUNTER — Encounter: Payer: Self-pay | Admitting: Surgery

## 2024-10-03 VITALS — BP 149/80 | HR 79 | Ht 66.0 in | Wt 240.0 lb

## 2024-10-03 DIAGNOSIS — Z170 Estrogen receptor positive status [ER+]: Secondary | ICD-10-CM

## 2024-10-03 DIAGNOSIS — Z09 Encounter for follow-up examination after completed treatment for conditions other than malignant neoplasm: Secondary | ICD-10-CM

## 2024-10-03 DIAGNOSIS — Z08 Encounter for follow-up examination after completed treatment for malignant neoplasm: Secondary | ICD-10-CM

## 2024-10-03 DIAGNOSIS — C50412 Malignant neoplasm of upper-outer quadrant of left female breast: Secondary | ICD-10-CM

## 2024-10-03 NOTE — Progress Notes (Signed)
 10/03/2024  HPI: Martha Woodard is a 59 y.o. female s/p left breast tag localized lumpectomy and SLNBx on 09/15/24.  Patient presents for follow up.  She reports that she's doing well.  She could not tolerate the breast binder as she felt it would pinch in the left axillary area.  She has been wearing a regular bra.  Reports at times sensation of pulling depending on what she's doing, but denies any worsening pain, swelling, or heaviness of the breast.    Vital signs: BP (!) 149/80   Pulse 79   Ht 5' 6 (1.676 m)   Wt 240 lb (108.9 kg)   LMP 04/10/2014   SpO2 96%   BMI 38.74 kg/m    Physical Exam: Constitutional:  No acute distress Breast:  Left breast s/p lumpectomy of upper outer quadrant.  Incision healing well and is clean, dry, intact.  Dermabond still in place.  Palpable firmness at the incision corresponding to scar tissue/inflammation changes.  Left axillary incision also healing well without any evidence of infection.    Assessment/Plan: This is a 59 y.o. female s/p left breast lumpectomy and SLNBx.  --Reviewed with her again pathology results showing negative lymph nodes and clear margins.  She has follow up appointment with Dr. Rennie and Dr. Lenn next week. --Discussed with patient that it is not uncommon to have intermittent sensation of pulling or tightness.  This will improve with time, but there's a possibility that she always experiences this to a lesser degree. --Follow up in 6 months.   Aloysius Sheree Plant, MD [facility]

## 2024-10-03 NOTE — Patient Instructions (Signed)
 We will see you back in 6 months. We placed you in our recall system and will send you out a letter with your appointment information once our schedule is available. In the meantime if you have any questions or concerns please don't hesitate to call our office.    GENERAL POST-OPERATIVE PATIENT INSTRUCTIONS   WOUND CARE INSTRUCTIONS:  Keep a dry clean dressing on the wound if there is drainage. The initial bandage may be removed after 24 hours.  Once the wound has quit draining you may leave it open to air.  If clothing rubs against the wound or causes irritation and the wound is not draining you may cover it with a dry dressing during the daytime.  Try to keep the wound dry and avoid ointments on the wound unless directed to do so.  If the wound becomes bright red and painful or starts to drain infected material that is not clear, please contact your physician immediately.  If the wound is mildly pink and has a thick firm ridge underneath it, this is normal, and is referred to as a healing ridge.  This will resolve over the next 4-6 weeks.  BATHING: You may shower if you have been informed of this by your surgeon. However, Please do not submerge in a tub, hot tub, or pool until incisions are completely sealed or have been told by your surgeon that you may do so.  DIET:  You may eat any foods that you can tolerate.  It is a good idea to eat a high fiber diet and take in plenty of fluids to prevent constipation.  If you do become constipated you may want to take a mild laxative or take ducolax tablets on a daily basis until your bowel habits are regular.  Constipation can be very uncomfortable, along with straining, after recent surgery.  ACTIVITY:  You are encouraged to cough and deep breath or use your incentive spirometer if you were given one, every 15-30 minutes when awake.  This will help prevent respiratory complications and low grade fevers post-operatively if you had a general anesthetic.  You  may want to hug a pillow when coughing and sneezing to add additional support to the surgical area, if you had abdominal or chest surgery, which will decrease pain during these times.  You are encouraged to walk and engage in light activity for the next two weeks.  You should not lift more than 20 pounds for 6 weeks total after surgery as it could put you at increased risk for complications.  Twenty pounds is roughly equivalent to a plastic bag of groceries. At that time- Listen to your body when lifting, if you have pain when lifting, stop and then try again in a few days. Soreness after doing exercises or activities of daily living is normal as you get back in to your normal routine.  MEDICATIONS:  Try to take narcotic medications and anti-inflammatory medications, such as tylenol , ibuprofen , naprosyn, etc., with food.  This will minimize stomach upset from the medication.  Should you develop nausea and vomiting from the pain medication, or develop a rash, please discontinue the medication and contact your physician.  You should not drive, make important decisions, or operate machinery when taking narcotic pain medication.  SUNBLOCK Use sun block to incision area over the next year if this area will be exposed to sun. This helps decrease scarring and will allow you avoid a permanent darkened area over your incision.  QUESTIONS:  Please  feel free to call our office if you have any questions, and we will be glad to assist you. [phone]

## 2024-10-04 ENCOUNTER — Other Ambulatory Visit: Payer: Self-pay | Admitting: Surgery

## 2024-10-05 ENCOUNTER — Ambulatory Visit: Attending: Internal Medicine | Admitting: Occupational Therapy

## 2024-10-05 DIAGNOSIS — Z1732 Human epidermal growth factor receptor 2 negative status: Secondary | ICD-10-CM | POA: Diagnosis not present

## 2024-10-05 DIAGNOSIS — Z170 Estrogen receptor positive status [ER+]: Secondary | ICD-10-CM | POA: Diagnosis not present

## 2024-10-05 DIAGNOSIS — E119 Type 2 diabetes mellitus without complications: Secondary | ICD-10-CM | POA: Insufficient documentation

## 2024-10-05 DIAGNOSIS — Z8041 Family history of malignant neoplasm of ovary: Secondary | ICD-10-CM | POA: Insufficient documentation

## 2024-10-05 DIAGNOSIS — Z87891 Personal history of nicotine dependence: Secondary | ICD-10-CM | POA: Diagnosis not present

## 2024-10-05 DIAGNOSIS — Z7984 Long term (current) use of oral hypoglycemic drugs: Secondary | ICD-10-CM | POA: Insufficient documentation

## 2024-10-05 DIAGNOSIS — Z1721 Progesterone receptor positive status: Secondary | ICD-10-CM | POA: Insufficient documentation

## 2024-10-05 DIAGNOSIS — Z8049 Family history of malignant neoplasm of other genital organs: Secondary | ICD-10-CM | POA: Insufficient documentation

## 2024-10-05 DIAGNOSIS — C50412 Malignant neoplasm of upper-outer quadrant of left female breast: Secondary | ICD-10-CM | POA: Insufficient documentation

## 2024-10-05 DIAGNOSIS — J449 Chronic obstructive pulmonary disease, unspecified: Secondary | ICD-10-CM | POA: Insufficient documentation

## 2024-10-05 DIAGNOSIS — Z803 Family history of malignant neoplasm of breast: Secondary | ICD-10-CM | POA: Diagnosis not present

## 2024-10-05 DIAGNOSIS — Z9981 Dependence on supplemental oxygen: Secondary | ICD-10-CM | POA: Insufficient documentation

## 2024-10-05 DIAGNOSIS — Z7951 Long term (current) use of inhaled steroids: Secondary | ICD-10-CM | POA: Insufficient documentation

## 2024-10-05 DIAGNOSIS — Z7982 Long term (current) use of aspirin: Secondary | ICD-10-CM | POA: Diagnosis not present

## 2024-10-05 DIAGNOSIS — Z800 Family history of malignant neoplasm of digestive organs: Secondary | ICD-10-CM | POA: Diagnosis not present

## 2024-10-05 DIAGNOSIS — Z79899 Other long term (current) drug therapy: Secondary | ICD-10-CM | POA: Insufficient documentation
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
## 2024-10-10 ENCOUNTER — Inpatient Hospital Stay

## 2024-10-10 ENCOUNTER — Ambulatory Visit
Admission: RE | Admit: 2024-10-10 | Discharge: 2024-10-10 | Disposition: A | Discharge status: Home / Self Care | Source: Ambulatory Visit | Attending: Radiation Oncology | Admitting: Radiation Oncology

## 2024-10-10 ENCOUNTER — Encounter: Payer: Self-pay | Admitting: Internal Medicine

## 2024-10-10 ENCOUNTER — Inpatient Hospital Stay: Attending: Internal Medicine | Admitting: Internal Medicine

## 2024-10-10 DIAGNOSIS — J9610 Chronic respiratory failure, unspecified whether with hypoxia or hypercapnia: Secondary | ICD-10-CM | POA: Insufficient documentation

## 2024-10-10 DIAGNOSIS — Z7722 Contact with and (suspected) exposure to environmental tobacco smoke (acute) (chronic): Secondary | ICD-10-CM | POA: Diagnosis not present

## 2024-10-10 DIAGNOSIS — Z7982 Long term (current) use of aspirin: Secondary | ICD-10-CM | POA: Insufficient documentation

## 2024-10-10 DIAGNOSIS — Z803 Family history of malignant neoplasm of breast: Secondary | ICD-10-CM | POA: Diagnosis not present

## 2024-10-10 DIAGNOSIS — Z87891 Personal history of nicotine dependence: Secondary | ICD-10-CM | POA: Diagnosis not present

## 2024-10-10 DIAGNOSIS — C50412 Malignant neoplasm of upper-outer quadrant of left female breast: Secondary | ICD-10-CM | POA: Insufficient documentation

## 2024-10-10 DIAGNOSIS — I2510 Atherosclerotic heart disease of native coronary artery without angina pectoris: Secondary | ICD-10-CM | POA: Diagnosis not present

## 2024-10-10 DIAGNOSIS — Z8042 Family history of malignant neoplasm of prostate: Secondary | ICD-10-CM | POA: Diagnosis not present

## 2024-10-10 DIAGNOSIS — Z170 Estrogen receptor positive status [ER+]: Secondary | ICD-10-CM | POA: Insufficient documentation

## 2024-10-10 DIAGNOSIS — Z7984 Long term (current) use of oral hypoglycemic drugs: Secondary | ICD-10-CM | POA: Insufficient documentation

## 2024-10-10 DIAGNOSIS — Z79899 Other long term (current) drug therapy: Secondary | ICD-10-CM | POA: Diagnosis not present

## 2024-10-10 DIAGNOSIS — Z800 Family history of malignant neoplasm of digestive organs: Secondary | ICD-10-CM | POA: Insufficient documentation

## 2024-10-10 DIAGNOSIS — J4489 Other specified chronic obstructive pulmonary disease: Secondary | ICD-10-CM | POA: Insufficient documentation

## 2024-10-10 DIAGNOSIS — E538 Deficiency of other specified B group vitamins: Secondary | ICD-10-CM | POA: Diagnosis not present

## 2024-10-10 DIAGNOSIS — I110 Hypertensive heart disease with heart failure: Secondary | ICD-10-CM | POA: Diagnosis not present

## 2024-10-10 DIAGNOSIS — I5032 Chronic diastolic (congestive) heart failure: Secondary | ICD-10-CM | POA: Insufficient documentation

## 2024-10-10 NOTE — Progress Notes (Signed)
 Tumor Board Documentation  Martha Woodard was presented by Dr. Rennie at our Tumor Board on 10/10/2024, which included representatives from medical oncology, radiation oncology, radiology, pathology.  Martha Woodard currently presents as a current patient with history of the following treatments: surgical intervention(s).  Additionally, we reviewed previous medical and familial history, history of present illness, and recent lab results along with all available histopathologic and imaging studies. The tumor board considered available treatment options and made the following recommendations: Adjuvant radiation, Hormonal therapy    The following procedures/referrals were also placed: No orders of the defined types were placed in this encounter.   Clinical Trial Status: not discussed   Staging used: AJCC Staging: T: T1b N: N0        National site-specific guidelines   were discussed with respect to the case.  Tumor board is a meeting of clinicians from various specialty areas who evaluate and discuss patients for whom a multidisciplinary approach is being considered. Final determinations in the plan of care are those of the provider(s). The responsibility for follow up of recommendations given during tumor board is that of the provider.   Todays extended care, comprehensive team conference, Martha Woodard was not present for the discussion and was not examined.   Multidisciplinary Tumor Board is a multidisciplinary case peer review process.  Decisions discussed in the Multidisciplinary Tumor Board reflect the opinions of the specialists present at the conference without having examined the patient.  Ultimately, treatment and diagnostic decisions rest with the primary provider(s) and the patient.

## 2024-10-10 NOTE — Consult Note (Signed)
 " NEW PATIENT EVALUATION  Name: Martha Woodard  MRN: [id number]  Date:   10/10/2024     DOB: 1965/12/06   This 59 y.o. female patient presents to the clinic for initial evaluation of stage Ia (pT1b pN0 M0) invasive lobular carcinoma of the left breast ER/PR positive.  REFERRING PHYSICIAN: Abbey Bruckner, MD  CHIEF COMPLAINT:  Chief Complaint  Patient presents with   Breast Cancer Left breast    DIAGNOSIS: The encounter diagnosis was Malignant neoplasm of upper-outer quadrant of left breast in female, estrogen receptor positive (HCC).   PREVIOUS INVESTIGATIONS:  Mammogram and ultrasound reviewed Pathology reports reviewed Clinical notes reviewed  HPI: Patient is a 59 year old female who presented with an abnormal screening mammogram of her left breast.  On diagnostic mammograms there is a suspicious 6 Miller mass at the site of screening mammographic concern.  No suspicious left axillary adenopathy was identified.  Lesion was at 2 o'clock position 15 cm from the nipple.  Biopsy was obtained showing invasive mammary carcinoma.  She went on to have a wide local excision for a 6.5 mm invasive lobular carcinoma overall grade 2.  Margins were clear at 7 mm.  2 sentinel lymph nodes were examined negative for metastatic disease.  Tumor was ER/PR positive HER2/neu not overexpressed.  She has done well postoperatively.  She specifically denies breast tenderness cough or bone pain.  Patient is on nasal oxygen  and states her breathing she believes has worsened since her lumpectomy.  She does have a history of heart failure with preserved ejection fraction also chronic respiratory failure COPD.  She otherwise is healing well she specifically denies breast tenderness or bone pain.  She is seen today for radiation oncology opinion.  PLANNED TREATMENT REGIMEN: Accelerated partial breast radiation  PAST MEDICAL HISTORY:  has a past medical history of (HFpEF) heart failure with preserved ejection fraction  (HCC), Allergic rhinitis (11/09/2017), Aortic atherosclerosis, Asthma, CAD (coronary artery disease), Cholelithiasis, Chronic respiratory failure (HCC) (11/13/2016), Colon polyp (01/01/2016), COPD (chronic obstructive pulmonary disease) (HCC), Diverticulosis, Hepatic steatosis, HLD (hyperlipidemia), Hypertension, Lipoma (04/07/2022), Long-term use of aspirin  therapy, Malignant neoplasm of upper-outer quadrant of left breast in female, estrogen receptor positive (HCC) (08/17/2024), Morbid obesity with BMI of 45.0-49.9, adult (HCC), Multiple lung nodules on CT, Obesity hypoventilation syndrome (HCC), 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    PAST SURGICAL HISTORY:  Past Surgical History:  Procedure Laterality Date   AXILLARY SENTINEL NODE BIOPSY Left 09/15/2024   Procedure: BIOPSY, LYMPH NODE, SENTINEL, AXILLARY;  Surgeon: Desiderio Schanz, MD;  Location: [facility];  Service: General;  Laterality: Left;   BIOPSY THYROID  Right 02/06/2014   NEGATIVE FOR MALIGNANT CELLS proteinaceous material and macrophages.   BREAST BIOPSY Left    neg- core   BREAST BIOPSY Left 08/11/2024   US  LT BREAST BX W LOC DEV 1ST LESION IMG BX SPEC US  GUIDE 08/11/2024 [facility]   BREAST BIOPSY Left 09/05/2024   US  LT BREAST SAVI/RF TAG 1ST LESION US  GUIDE 09/05/2024 [facility]   BREAST BIOPSY  09/05/2024   MM LT BREAST SAVI/RF TAG 1ST LESION MAMMO GUIDE 09/05/2024 [facility]   BREAST LUMPECTOMY WITH RADIO FREQUENCY LOCALIZER Left 09/15/2024   Procedure: BREAST LUMPECTOMY WITH RADIO FREQUENCY LOCALIZER;  Surgeon: Desiderio Schanz, MD;  Location: [facility];  Service: General;  Laterality: Left;   COLONOSCOPY WITH PROPOFOL  N/A 01/01/2016   Procedure: COLONOSCOPY WITH PROPOFOL ;  Surgeon: Rogelia Copping, MD;  Location: [facility];  Service:  Endoscopy;  Laterality: N/A;   RECONSTRUCTION OF EYELID  06/16/2024   thryoid fna Right 12/2013   Proteinaceous material and macrophages.   WISDOM TOOTH EXTRACTION      FAMILY HISTORY: family history includes Breast cancer (age of onset: 5) in her sister; Breast cancer (age of onset: 67) in her sister; Colon cancer in her brother; Goiter in her mother; Ovarian cancer (age of onset: 22) in her mother; Stroke in her brother and father; Uterine cancer (age of onset: 56) in her mother.  SOCIAL HISTORY:  reports that she quit smoking about 11 years ago. Her smoking use included cigarettes. She started smoking about 45 years ago. She has a 102 pack-year smoking history. She has been exposed to tobacco smoke. She has never used smokeless tobacco. She reports that she does not drink alcohol and does not use drugs.  ALLERGIES: Amoxicillin -pot clavulanate  MEDICATIONS:  Current Outpatient Medications  Medication Sig Dispense Refill   Accu-Chek Softclix Lancets lancets TEST UP TO 4 TIMES DAILY 400 each 3   albuterol  (PROVENTIL ) (2.5 MG/3ML) 0.083% nebulizer solution Take 3 mLs (2.5 mg total) by nebulization every 6 (six) hours as needed for wheezing or shortness of breath. 125 mL 12   albuterol  (VENTOLIN  HFA) 108 (90 Base) MCG/ACT inhaler Inhale 2 puffs into the lungs every 6 (six) hours as needed for wheezing or shortness of breath. 54 g 2   aspirin  EC 81 MG tablet Take 1 tablet (81 mg total) by mouth daily. (Patient taking differently: Take 81 mg by mouth at bedtime.) 90 tablet 3   budesonide  (PULMICORT ) 0.5 MG/2ML nebulizer solution USE 1 VIAL VIA NABULIZER TWICE A DAY RINSE MOUTH AFTER EACH USE 60 mL 11   calcium -vitamin D  (OSCAL WITH D) 500-200 MG-UNIT tablet Take 1 tablet by mouth daily with breakfast.     cetirizine (ZYRTEC) 10 MG tablet Take 10 mg by mouth in the morning.     ezetimibe  (ZETIA ) 10 MG tablet Take 1 tablet (10 mg total) by mouth daily. 90 tablet 3   fluticasone  (FLONASE ) 50  MCG/ACT nasal spray USE 1 OR 2 SPRAYS IN EACH NOSTRIL ONCE DAILY. 16 g 3   furosemide  (LASIX ) 40 MG tablet Take 1 tablet (40 mg total) by mouth daily. 90 tablet 3   gabapentin  (NEURONTIN ) 100 MG capsule Take 1 capsule (100 mg total) by mouth 2 (two) times daily. 180 capsule 3   glucose blood (ACCU-CHEK AVIVA PLUS) test strip TEST UP TO 4 TIMES DAILY AS DIRECTED 400 strip 3   Glycopyrrolate-Formoterol  (BEVESPI  AEROSPHERE) 9-4.8 MCG/ACT AERO Inhale 2 puffs into the lungs 2 (two) times daily. 10.7 g 11   ibuprofen  (ADVIL ) 600 MG tablet Take 1 tablet (600 mg total) by mouth every 8 (eight) hours as needed for moderate pain (pain score 4-6). 60 tablet 1   losartan  (COZAAR ) 25 MG tablet TAKE 1 TABLET BY MOUTH TWICE  DAILY 200 tablet 2   magnesium oxide (MAG-OX) 400 MG tablet Take 400 mg by mouth daily.     metFORMIN  (GLUCOPHAGE ) 500 MG tablet Take 2 tablets (1,000 mg total) by mouth 2 (two) times daily with a meal. 360 tablet 3   metoprolol  succinate (TOPROL -XL) 25 MG 24 hr tablet TAKE 1 TABLET BY MOUTH DAILY 100 tablet 2   montelukast  (SINGULAIR ) 10 MG tablet TAKE 1 TABLET BY MOUTH AT  BEDTIME 100 tablet 2   Multiple Vitamin (MULTIVITAMIN) tablet Take 1 tablet by  mouth daily.     nystatin  (MYCOSTATIN ) 100000 UNIT/ML suspension Take 5 mLs (500,000 Units total) by mouth 4 (four) times daily. (Patient taking differently: Take 5 mLs by mouth 4 (four) times daily as needed (irritation.).) 473 mL 1   ondansetron  (ZOFRAN ) 4 MG tablet Take 1 tablet (4 mg total) by mouth daily as needed for nausea or vomiting. 20 tablet 0   OXYGEN  Inhale 1 L/L into the lungs continuous as needed. Uses 1 liter when out of the house and 2 liters at night.     potassium chloride  20 MEQ TBCR Take 1 tablet (20 mEq total) by mouth 2 (two) times daily. 180 tablet 3   rosuvastatin  (CRESTOR ) 5 MG tablet Take 1 tablet (5 mg total) by mouth daily. (Patient taking differently: Take 5 mg by mouth at bedtime.) 90 tablet 3   sodium chloride   (OCEAN) 0.65 % SOLN nasal spray Place 2 sprays into both nostrils as needed for congestion. 30 mL 11   vitamin C (ASCORBIC ACID) 500 MG tablet Take 1,000 mg by mouth in the morning and at bedtime. Chewable     No current facility-administered medications for this encounter.    ECOG PERFORMANCE STATUS:  0 - Asymptomatic  REVIEW OF SYSTEMS: Patient denies any weight loss, fatigue, weakness, fever, chills or night sweats. Patient denies any loss of vision, blurred vision. Patient denies any ringing  of the ears or hearing loss. No irregular heartbeat. Patient denies heart murmur or history of fainting. Patient denies any chest pain or pain radiating to her upper extremities. Patient denies any shortness of breath, difficulty breathing at night, cough or hemoptysis. Patient denies any swelling in the lower legs. Patient denies any nausea vomiting, vomiting of blood, or coffee ground material in the vomitus. Patient denies any stomach pain. Patient states has had normal bowel movements no significant constipation or diarrhea. Patient denies any dysuria, hematuria or significant nocturia. Patient denies any problems walking, swelling in the joints or loss of balance. Patient denies any skin changes, loss of hair or loss of weight. Patient denies any excessive worrying or anxiety or significant depression. Patient denies any problems with insomnia. Patient denies excessive thirst, polyuria, polydipsia. Patient denies any swollen glands, patient denies easy bruising or easy bleeding. Patient denies any recent infections, allergies or URI. Patient s visual fields have not changed significantly in recent time.   PHYSICAL EXAM: LMP 04/10/2014  Patient status post wide local excision of the left breast and sentinel node biopsy scar both healing well no dominant masses noted in either breast no axillary or supraclavicular adenopathy identified.  Patient has large pendulous breasts.  She is currently on nasal  oxygen .  Well-developed well-nourished patient in NAD. HEENT reveals PERLA, EOMI, discs not visualized.  Oral cavity is clear. No oral mucosal lesions are identified. Neck is clear without evidence of cervical or supraclavicular adenopathy. Lungs are clear to A&P. Cardiac examination is essentially unremarkable with regular rate and rhythm without murmur rub or thrill. Abdomen is benign with no organomegaly or masses noted. Motor sensory and DTR levels are equal and symmetric in the upper and lower extremities. Cranial nerves II through XII are grossly intact. Proprioception is intact. No peripheral adenopathy or edema is identified. No motor or sensory levels are noted. Crude visual fields are within normal range.  LABORATORY DATA: Pathology reports reviewed    RADIOLOGY RESULTS: Mammogram and ultrasound reviewed compatible with above-stated findings   IMPRESSION: Stage Ia ER positive invasive lobular carcinoma left breast  status post wide local excision and sentinel node biopsy in 59 year old female  PLAN: Based on the small size of the lesion her large pendulous breast and overall poor pulmonary function I believe accelerated partial breast radiation would be the best avenue of treatment.  Would plan on delivering 35 Gray in 10 fractions using IMRT treatment planning and delivery.  I will choose IMRT to spare critical structures such as her lung and heart.  Risks and benefits of treatment occluding extreme low side effect profile possibly skin reaction and fatigue were discussed in detail with the patient.  I personally set up and ordered CT simulation for next week.  I would like to take this opportunity to thank you for allowing me to participate in the care of your patient.SABRA Marcey Penton, MD         "

## 2024-10-10 NOTE — Assessment & Plan Note (Addendum)
#   NOV 2025- 6 mm mass  at 2. No suspicious LEFT axillary adenopathy. 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 LEFT BREAST clinical stage I- Dr.Piscoya. DEC 2025- s/p lumpectomy-lobular invasive carcinoma pT1b pN0-100% ER/PR positive HER2 negative-given low risk/and also patient being a poor candidate for any chemotherapy-recommend holding of Oncotype.  # I reviewed the pathology with the patient and family in detail.  Recommend evaluation Dr. Crystal-for consideration of postlumpectomy radiation.  #I again discussed the role of endocrine therapy-given ER/PR positive disease postsurgery.  Patient will be offered antihormone pill 1 a day for 5 years.  Discussed potential downsides including but not limited to arthralgias hot flashes and osteoporosis. BMD. Will order at next visit.   # DM hb A1 C-metformin -A1c-6.5.  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 Gollan Dr.Gonzalez- on Home O2 1-2 lits/   # s/p Genetics counseling: genetic counselling -  # DISPOSITION: # Follow up in 2 months- MD; labs- cbc/cmp; Vit D 25-OH;  Dr.B

## 2024-10-10 NOTE — Progress Notes (Signed)
 Left Lumpectomy 09/15/24, Dr. Desiderio.  Pt states she has noticed her breathing has gotten worse. On O2-1L in the morning and 2 at bedtime.

## 2024-10-10 NOTE — Progress Notes (Signed)
 [facility] CONSULT NOTE  Patient Care Team: Abbey Bruckner, MD as PCP - General (Family Medicine) Perla, Evalene PARAS, MD as Consulting Physician (Cardiology) Tamea Dedra CROME, MD as Consulting Physician (Pulmonary Disease) Mevelyn Charleston, OD as Referring Physician (Optometry) Georgina Shasta POUR, RN as Oncology Nurse Navigator Rennie Cindy SAUNDERS, MD as Consulting Physician (Oncology)  CHIEF COMPLAINTS/PURPOSE OF CONSULTATION: Breast cancer  #  Oncology History Overview Note  1. Breast, left, needle core biopsy, 2:00 15cmfn 5mm (coil clip) :       INVASIVE MAMMARY CARCINOMA       MAMMARY CARCINOMA IN SITU, SOLID INTERMEDIATE NUCLEAR GRADE       TUBULE FORMATION: SCORE 3       NUCLEAR PLEOMORPHISM: SCORE 2       MITOTIC COUNT: SCORE 1       TOTAL SCORE: 6       OVERALL GRADE: 2       LYMPHOVASCULAR INVASION: NOT IDENTIFIED   ER-100%; PR 100% HER2 negative Ki-67 5%   Malignant neoplasm of upper-outer quadrant of left breast in female, estrogen receptor positive (HCC)  08/17/2024 Initial Diagnosis   Malignant neoplasm of upper-outer quadrant of left breast in female, estrogen receptor positive (HCC)   08/17/2024 Cancer Staging   Staging form: Breast, AJCC 8th Edition - Clinical: Stage IA (cT1b, cN0, cM0, G2, ER+, PR+, HER2-) - Signed by Rennie Cindy SAUNDERS, MD on 08/17/2024 Histologic grading system: 3 grade system   08/25/2024 Genetic Testing   Negative [facility] Hereditary Breast Cancer stat panel (9 genes) and [facility] Common Hereditary Cancers+RNA Panel. VUS in MUTYH identified. Final report date 08/25/2024.  The STAT Breast cancer panel offered by [facility] includes sequencing and rearrangement analysis for the following 9 genes:  ATM, BRCA1, BRCA2, CDH1, CHEK2, PALB2, PTEN, STK11 and TP53.     The Common Hereditary Cancers Panel + RNA offered by [facility] includes sequencing and/or deletion duplication testing of the following 48 genes: APC*, ATM*, AXIN2, BAP1, BARD1,  BMPR1A, BRCA1, BRCA2, BRIP1, CDH1, CDK4, CDKN2A (p14ARF), CDKN2A (p16INK4a), CHEK2, CTNNA1, DICER1*, EPCAM*, FH*, GREM1*, HOXB13, KIT, MBD4, MEN1*, MLH1*, MSH2*, MSH3*, MSH6*, MUTYH, NF1*, NTHL1, PALB2, PDGFRA, PMS2*, POLD1*, POLE, PTEN*, RAD51C, RAD51D, SDHA*, SDHB, SDHC*, SDHD, SMAD4, SMARCA4, STK11, TP53, TSC1*, TSC2, VHL. '      Discussed the use of AI scribe software for clinical note transcription with the patient, who gave verbal consent to proceed.  History of Present Illness   Martha Woodard is a 59 year old female with a early-stage ER/PR positive HER2 negative breast cancer stage I-currently status post lumpectomy is here for a follow-up/review results of the pathology status post lumpectomy.  Patient denies any significant complications from her recent lumpectomy.  She is healing well..   Pt states she has noticed her breathing has gotten worse. On O2-1L in the morning and 2 at bedtime      Review of Systems  Constitutional:  Positive for malaise/fatigue. Negative for chills, diaphoresis, fever and weight loss.  HENT:  Negative for nosebleeds and sore throat.   Eyes:  Negative for double vision.  Respiratory:  Positive for shortness of breath. Negative for cough, hemoptysis, sputum production and wheezing.   Cardiovascular:  Negative for chest pain, palpitations, orthopnea and leg swelling.  Gastrointestinal:  Negative for abdominal pain, blood in stool, constipation, diarrhea, heartburn, melena, nausea and vomiting.  Genitourinary:  Negative for dysuria, frequency and urgency.  Musculoskeletal:  Positive for back pain and joint pain.  Skin: Negative.  Negative for itching and rash.  Neurological:  Negative for dizziness, tingling, focal weakness, weakness and headaches.  Endo/Heme/Allergies:  Does not bruise/bleed easily.  Psychiatric/Behavioral:  Negative for depression. The patient is not nervous/anxious and does not have insomnia.      MEDICAL HISTORY:  Past Medical  History:  Diagnosis Date   (HFpEF) heart failure with preserved ejection fraction (HCC)    Allergic rhinitis 11/09/2017   Aortic atherosclerosis    Asthma    CAD (coronary artery disease)    Cholelithiasis    Chronic respiratory failure (HCC) 11/13/2016   a.) on supplemental oxygen    Colon polyp 01/01/2016   COPD (chronic obstructive pulmonary disease) (HCC)    Diverticulosis    Hepatic steatosis    HLD (hyperlipidemia)    Hypertension    Lipoma 04/07/2022   Long-term use of aspirin  therapy    Malignant neoplasm of upper-outer quadrant of left breast in female, estrogen receptor positive (HCC) 08/17/2024   Morbid obesity with BMI of 45.0-49.9, adult (HCC)    Multiple lung nodules on CT    Obesity hypoventilation syndrome (HCC)    Papilloma of oral cavity 12/13/2018   Shortness of breath dyspnea    Supplemental oxygen  dependent    a.) 1L/[place] during the day; 2L/[place] at bedtime   T2DM (type 2 diabetes mellitus) (HCC)    Venous insufficiency 08/16/2017   Vitamin B 12 deficiency 07/06/2024    SURGICAL HISTORY: Past Surgical History:  Procedure Laterality Date   AXILLARY SENTINEL NODE BIOPSY Left 09/15/2024   Procedure: BIOPSY, LYMPH NODE, SENTINEL, AXILLARY;  Surgeon: Desiderio Schanz, MD;  Location: [facility] ORS;  Service: General;  Laterality: Left;   BIOPSY THYROID  Right 02/06/2014   NEGATIVE FOR MALIGNANT CELLS proteinaceous material and macrophages.   BREAST BIOPSY Left    neg- core   BREAST BIOPSY Left 08/11/2024   US  LT BREAST BX W LOC DEV 1ST LESION IMG BX SPEC US  GUIDE 08/11/2024 [facility]   BREAST BIOPSY Left 09/05/2024   US  LT BREAST SAVI/RF TAG 1ST LESION US  GUIDE 09/05/2024 [facility]   BREAST BIOPSY  09/05/2024   MM LT BREAST SAVI/RF TAG 1ST LESION MAMMO GUIDE 09/05/2024 [facility]   BREAST LUMPECTOMY WITH RADIO FREQUENCY LOCALIZER Left 09/15/2024   Procedure: BREAST LUMPECTOMY WITH RADIO FREQUENCY LOCALIZER;  Surgeon: Desiderio Schanz, MD;  Location: [facility]  [facility];  Service: General;  Laterality: Left;   COLONOSCOPY WITH PROPOFOL  N/A 01/01/2016   Procedure: COLONOSCOPY WITH PROPOFOL ;  Surgeon: Rogelia Copping, MD;  Location: [facility];  Service: Endoscopy;  Laterality: N/A;   RECONSTRUCTION OF EYELID  06/16/2024   thryoid fna Right 12/2013   Proteinaceous material and macrophages.   WISDOM TOOTH EXTRACTION      SOCIAL HISTORY: Social History   Socioeconomic History   Marital status: Single    Spouse name: Not on file   Number of children: Not on file   Years of education: Not on file   Highest education level: Not on file  Occupational History   Not on file  Tobacco Use   Smoking status: Former    Current packs/day: 0.00    Average packs/day: 3.0 packs/day for 34.0 years (102.0 ttl pk-yrs)    Types: Cigarettes    Start date: 07/11/1979    Quit date: 07/10/2013    Years since quitting: 11.2    Passive exposure: Past   Smokeless tobacco: Never  Vaping Use   Vaping status: Former  Substance and Sexual Activity   Alcohol use: No  Alcohol/week: 0.0 standard drinks of alcohol   Drug use: No   Sexual activity: Not Currently  Other Topics Concern   Not on file  Social History Narrative   Not on file   Social Drivers of Health   Tobacco Use: Medium Risk (10/10/2024)   Patient History    Smoking Tobacco Use: Former    Smokeless Tobacco Use: Never    Passive Exposure: Past  Physicist, Medical Strain: Low Risk (08/19/2024)   Overall Financial Resource Strain (CARDIA)    Difficulty of Paying Living Expenses: Not hard at all  Food Insecurity: No Food Insecurity (08/19/2024)   Epic    Worried About Programme Researcher, Broadcasting/film/video in the Last Year: Never true    Ran Out of Food in the Last Year: Never true  Transportation Needs: No Transportation Needs (08/19/2024)   Epic    Lack of Transportation (Medical): No    Lack of Transportation (Non-Medical): No  Physical Activity: Inactive (08/19/2024)   Exercise Vital Sign    Days of Exercise  per Week: 0 days    Minutes of Exercise per Session: 0 min  Stress: Stress Concern Present (08/19/2024)   [employer] of Occupational Health - Occupational Stress Questionnaire    Feeling of Stress: To some extent  Social Connections: Moderately Isolated (08/19/2024)   Social Connection and Isolation Panel    Frequency of Communication with Friends and Family: More than three times a week    Frequency of Social Gatherings with Friends and Family: More than three times a week    Attends Religious Services: Never    Database Administrator or Organizations: Yes    Attends Banker Meetings: Never    Marital Status: Divorced  Catering Manager Violence: Not At Risk (08/17/2024)   Epic    Fear of Current or Ex-Partner: No    Emotionally Abused: No    Physically Abused: No    Sexually Abused: No  Depression (PHQ2-9): Low Risk (10/10/2024)   Depression (PHQ2-9)    PHQ-2 Score: 0  Alcohol Screen: Low Risk (08/19/2024)   Alcohol Screen    Last Alcohol Screening Score (AUDIT): 0  Housing: Low Risk (08/19/2024)   Epic    Unable to Pay for Housing in the Last Year: No    Number of Times Moved in the Last Year: 0    Homeless in the Last Year: No  Utilities: Not At Risk (08/19/2024)   Epic    Threatened with loss of utilities: No  Health Literacy: Adequate Health Literacy (08/19/2024)   B1300 Health Literacy    Frequency of need for help with medical instructions: Never    FAMILY HISTORY: Family History  Problem Relation Age of Onset   Ovarian cancer Mother 65   Goiter Mother    Uterine cancer Mother 3   Stroke Father    Breast cancer Sister 92   Breast cancer Sister 39   Stroke Brother    Colon cancer Brother        dx 72s    ALLERGIES:  is allergic to amoxicillin -pot clavulanate.  MEDICATIONS:  Current Outpatient Medications  Medication Sig Dispense Refill   Accu-Chek Softclix Lancets lancets TEST UP TO 4 TIMES DAILY 400 each 3   albuterol  (PROVENTIL )  (2.5 MG/3ML) 0.083% nebulizer solution Take 3 mLs (2.5 mg total) by nebulization every 6 (six) hours as needed for wheezing or shortness of breath. 125 mL 12   albuterol  (VENTOLIN  HFA) 108 (90 Base) MCG/ACT inhaler Inhale  2 puffs into the lungs every 6 (six) hours as needed for wheezing or shortness of breath. 54 g 2   aspirin  EC 81 MG tablet Take 1 tablet (81 mg total) by mouth daily. (Patient taking differently: Take 81 mg by mouth at bedtime.) 90 tablet 3   budesonide  (PULMICORT ) 0.5 MG/2ML nebulizer solution USE 1 VIAL VIA NABULIZER TWICE A DAY RINSE MOUTH AFTER EACH USE 60 mL 11   calcium -vitamin D  (OSCAL WITH D) 500-200 MG-UNIT tablet Take 1 tablet by mouth daily with breakfast.     cetirizine (ZYRTEC) 10 MG tablet Take 10 mg by mouth in the morning.     ezetimibe  (ZETIA ) 10 MG tablet Take 1 tablet (10 mg total) by mouth daily. 90 tablet 3   fluticasone  (FLONASE ) 50 MCG/ACT nasal spray USE 1 OR 2 SPRAYS IN EACH NOSTRIL ONCE DAILY. 16 g 3   furosemide  (LASIX ) 40 MG tablet Take 1 tablet (40 mg total) by mouth daily. 90 tablet 3   gabapentin  (NEURONTIN ) 100 MG capsule Take 1 capsule (100 mg total) by mouth 2 (two) times daily. 180 capsule 3   glucose blood (ACCU-CHEK AVIVA PLUS) test strip TEST UP TO 4 TIMES DAILY AS DIRECTED 400 strip 3   Glycopyrrolate-Formoterol  (BEVESPI  AEROSPHERE) 9-4.8 MCG/ACT AERO Inhale 2 puffs into the lungs 2 (two) times daily. 10.7 g 11   ibuprofen  (ADVIL ) 600 MG tablet Take 1 tablet (600 mg total) by mouth every 8 (eight) hours as needed for moderate pain (pain score 4-6). 60 tablet 1   losartan  (COZAAR ) 25 MG tablet TAKE 1 TABLET BY MOUTH TWICE  DAILY 200 tablet 2   magnesium oxide (MAG-OX) 400 MG tablet Take 400 mg by mouth daily.     metFORMIN  (GLUCOPHAGE ) 500 MG tablet Take 2 tablets (1,000 mg total) by mouth 2 (two) times daily with a meal. 360 tablet 3   metoprolol  succinate (TOPROL -XL) 25 MG 24 hr tablet TAKE 1 TABLET BY MOUTH DAILY 100 tablet 2   montelukast   (SINGULAIR ) 10 MG tablet TAKE 1 TABLET BY MOUTH AT  BEDTIME 100 tablet 2   Multiple Vitamin (MULTIVITAMIN) tablet Take 1 tablet by mouth daily.     nystatin  (MYCOSTATIN ) 100000 UNIT/ML suspension Take 5 mLs (500,000 Units total) by mouth 4 (four) times daily. (Patient taking differently: Take 5 mLs by mouth 4 (four) times daily as needed (irritation.).) 473 mL 1   ondansetron  (ZOFRAN ) 4 MG tablet Take 1 tablet (4 mg total) by mouth daily as needed for nausea or vomiting. 20 tablet 0   OXYGEN  Inhale 1 L/L into the lungs continuous as needed. Uses 1 liter when out of the house and 2 liters at night.     potassium chloride  20 MEQ TBCR Take 1 tablet (20 mEq total) by mouth 2 (two) times daily. 180 tablet 3   rosuvastatin  (CRESTOR ) 5 MG tablet Take 1 tablet (5 mg total) by mouth daily. (Patient taking differently: Take 5 mg by mouth at bedtime.) 90 tablet 3   sodium chloride  (OCEAN) 0.65 % SOLN nasal spray Place 2 sprays into both nostrils as needed for congestion. 30 mL 11   vitamin C (ASCORBIC ACID) 500 MG tablet Take 1,000 mg by mouth in the morning and at bedtime. Chewable     No current facility-administered medications for this visit.      SABRA  PHYSICAL EXAMINATION:  Vitals:   10/10/24 1257  BP: 113/70  Pulse: 94  Resp: 12  Temp: 98.9 F (37.2 C)  SpO2:  95%   Filed Weights   10/10/24 1257  Weight: 243 lb 12.8 oz (110.6 kg)    Physical Exam Vitals and nursing note reviewed.  HENT:     Head: Normocephalic and atraumatic.     Mouth/Throat:     Pharynx: Oropharynx is clear.  Eyes:     Extraocular Movements: Extraocular movements intact.     Pupils: Pupils are equal, round, and reactive to light.  Cardiovascular:     Rate and Rhythm: Normal rate and regular rhythm.  Pulmonary:     Comments: Decreased breath sounds bilaterally.  Abdominal:     Palpations: Abdomen is soft.  Musculoskeletal:        General: Normal range of motion.     Cervical back: Normal range of motion.   Skin:    General: Skin is warm.  Neurological:     General: No focal deficit present.     Mental Status: She is alert and oriented to person, place, and time.  Psychiatric:        Behavior: Behavior normal.        Judgment: Judgment normal.      LABORATORY DATA:  I have reviewed the data as listed Lab Results  Component Value Date   WBC 5.5 08/15/2024   HGB 14.0 08/15/2024   HCT 43.1 08/15/2024   MCV 86.3 08/15/2024   PLT 256.0 08/15/2024   Recent Labs    10/26/23 1224 07/06/24 1059 07/15/24 1101 08/15/24 1059  NA 141 144  --   --   K 3.9 4.2  --   --   CL 102 100  --   --   CO2 34* 33*  --   --   GLUCOSE 85 94  --   --   BUN 11 14  --   --   CREATININE 0.56 0.59  --   --   CALCIUM  9.9 10.2  --   --   PROT 6.9 6.7 6.7 6.6  ALBUMIN  4.4 4.5 4.4 4.4  AST 30 56* 45* 42*  ALT 30 61* 56* 45*  ALKPHOS 55 50 63 54  BILITOT 0.3 0.4 0.4 0.3  BILIDIR  --   --  0.1 0.1    RADIOGRAPHIC STUDIES: I have personally reviewed the radiological images as listed and agreed with the findings in the report. DG BREAST SURGICAL SPECIMEN NO CHARGE Result Date: 09/16/2024 This procedure is a no report and no charge.  It will auto finalize.  MM Breast Surgical Specimen Result Date: 09/15/2024 CLINICAL DATA:  Status post [facility] localized LEFT breast lumpectomy. Patient is status post ultrasound-guided biopsy of a LEFT breast mass which demonstrated invasive mammary carcinoma (COIL). This was localized at the SAVI scalp. Initial SAVI scout was slightly displaced so a second SAVI scout was placed EXAM: SPECIMEN RADIOGRAPH OF THE LEFT BREAST COMPARISON:  Previous exam(s). FINDINGS: Status post excision of the LEFT breast. The 2 Savi Scout reflectors and COIL shaped clip are present within the specimen. Additional omega clip from prior benign biopsy is within the surgical specimen. IMPRESSION: Specimen radiograph of the LEFT breast. Electronically Signed   By: Corean Salter M.D.   On:  09/15/2024 14:02   NM Sentinel Node Inj-No Rpt (Breast) Result Date: 09/15/2024 Lymphoseek was injected by the Nuclear Medicine Technologist for sentinel lymph node localization.    ASSESSMENT & PLAN:   Malignant neoplasm of upper-outer quadrant of left breast in female, estrogen receptor positive (HCC) # NOV 2025- 6 mm mass  at 2. No suspicious LEFT axillary adenopathy. 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 LEFT BREAST clinical stage I- Dr.Piscoya. DEC 2025- s/p lumpectomy-lobular invasive carcinoma pT1b pN0-100% ER/PR positive HER2 negative-given low risk/and also patient being a poor candidate for any chemotherapy-recommend holding of Oncotype.  # I reviewed the pathology with the patient and family in detail.  Recommend evaluation Dr. Crystal-for consideration of postlumpectomy radiation.  #I again discussed the role of endocrine therapy-given ER/PR positive disease postsurgery.  Patient will be offered antihormone pill 1 a day for 5 years.  Discussed potential downsides including but not limited to arthralgias hot flashes and osteoporosis. BMD. Will order at next visit.   # DM hb A1 C-metformin -A1c-6.5.  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 Gollan Dr.Gonzalez- on Home O2 1-2 lits/   # s/p Genetics counseling: genetic counselling -  # DISPOSITION: # Follow up in 2 months- MD; labs- cbc/cmp; Vit D 25-OH;  Dr.B      Cindy JONELLE Joe, MD 10/10/2024 1:43 PM

## 2024-10-18 ENCOUNTER — Encounter: Payer: Self-pay | Admitting: *Deleted

## 2024-10-18 ENCOUNTER — Ambulatory Visit
Admission: RE | Admit: 2024-10-18 | Discharge: 2024-10-18 | Disposition: A | Discharge status: Home / Self Care | Source: Ambulatory Visit | Attending: Radiation Oncology | Admitting: Radiation Oncology

## 2024-10-21 NOTE — Progress Notes (Signed)
 Martha Woodard                                          MRN: [id number]   10/21/2024   The [facility] Quality Team Specialist reviewed this patient medical record for the purposes of chart review for care gap closure. The following were reviewed: chart review for care gap closure-glycemic status assessment.    [facility] Quality Team

## 2024-10-26 ENCOUNTER — Ambulatory Visit

## 2024-10-26 VITALS — BP 130/66 | HR 81 | Temp 99.0°F | Ht 66.0 in | Wt 240.8 lb

## 2024-10-26 DIAGNOSIS — R945 Abnormal results of liver function studies: Secondary | ICD-10-CM | POA: Diagnosis not present

## 2024-10-26 DIAGNOSIS — M25562 Pain in left knee: Secondary | ICD-10-CM | POA: Diagnosis not present

## 2024-10-26 DIAGNOSIS — C50412 Malignant neoplasm of upper-outer quadrant of left female breast: Secondary | ICD-10-CM | POA: Diagnosis not present

## 2024-10-26 DIAGNOSIS — R309 Painful micturition, unspecified: Secondary | ICD-10-CM | POA: Diagnosis not present

## 2024-10-26 DIAGNOSIS — L219 Seborrheic dermatitis, unspecified: Secondary | ICD-10-CM | POA: Diagnosis not present

## 2024-10-26 DIAGNOSIS — Z170 Estrogen receptor positive status [ER+]: Secondary | ICD-10-CM

## 2024-10-26 DIAGNOSIS — R110 Nausea: Secondary | ICD-10-CM | POA: Diagnosis not present

## 2024-10-26 DIAGNOSIS — M6208 Separation of muscle (nontraumatic), other site: Secondary | ICD-10-CM | POA: Diagnosis not present

## 2024-10-26 DIAGNOSIS — G8929 Other chronic pain: Secondary | ICD-10-CM

## 2024-10-26 DIAGNOSIS — E1140 Type 2 diabetes mellitus with diabetic neuropathy, unspecified: Secondary | ICD-10-CM | POA: Diagnosis not present

## 2024-10-26 DIAGNOSIS — K219 Gastro-esophageal reflux disease without esophagitis: Secondary | ICD-10-CM | POA: Diagnosis not present

## 2024-10-26 DIAGNOSIS — M25561 Pain in right knee: Secondary | ICD-10-CM | POA: Diagnosis not present

## 2024-10-26 MED ORDER — MELOXICAM 7.5 MG PO TABS: 7.5000 mg | ORAL_TABLET | Freq: Every day | ORAL | 1 refills | Status: AC | PRN

## 2024-10-26 MED ORDER — KETOCONAZOLE 2 % EX SHAM: 1.0000 | MEDICATED_SHAMPOO | CUTANEOUS | 2 refills | Status: AC

## 2024-10-26 MED ORDER — PANTOPRAZOLE SODIUM 40 MG PO TBEC: 40.0000 mg | DELAYED_RELEASE_TABLET | Freq: Every day | ORAL | 1 refills | Status: AC

## 2024-10-26 MED ORDER — ONDANSETRON HCL 4 MG PO TABS: 4.0000 mg | ORAL_TABLET | Freq: Every day | ORAL | 0 refills | Status: AC | PRN

## 2024-10-26 NOTE — Assessment & Plan Note (Addendum)
 Check UA to rule out UTI.  Management pending UA result. Orders:   Urinalysis, Routine w reflex microscopic

## 2024-10-26 NOTE — Assessment & Plan Note (Addendum)
 Reviewed GI evaluation from 2018.  Suspect elevated LFTs secondary to fatty liver disease.  Check CMP today.  If LFT continues to be elevated recommend right upper quadrant ultrasound and referral to GI for further evaluation. Orders:   Comp Met (CMET)

## 2024-10-26 NOTE — Assessment & Plan Note (Addendum)
 Recommend starting pantoprazole  40 mg daily when she takes meloxicam .  Refill sent to the pharmacy.  Suspect nausea might be related to GERD.  If worsens recommend seeing GI.  Previously seen by Dr. In 2018. Orders:   pantoprazole  (PROTONIX ) 40 MG tablet; Take 1 tablet (40 mg total) by mouth daily.

## 2024-10-26 NOTE — Assessment & Plan Note (Addendum)
 Will check A1c today.  Continue metformin  1000 mg twice a day. Orders:   HgB A1c

## 2024-10-26 NOTE — Assessment & Plan Note (Addendum)
 Intermittent, no obvious triggers.  Happening once every couple of months.  Okay to continue taking Zofran  4 mg daily as needed for nausea. Orders:   ondansetron  (ZOFRAN ) 4 MG tablet; Take 1 tablet (4 mg total) by mouth daily as needed for nausea or vomiting.

## 2024-10-26 NOTE — Progress Notes (Signed)
 "  Established Patient Office Visit   Subjective  Patient ID: Martha Woodard, female    DOB: Oct 01, 1965  Age: 59 y.o. MRN: [id number]  Chief Complaint  Patient presents with   Diabetes    Discussed the use of AI scribe software for clinical note transcription with the patient, who gave verbal consent to proceed.  History of Present Illness Martha Woodard is a 59 year old female with a history of cancer who presents for chronic medication follow up.   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 Bruce surgery on 09/15/24: L breast lumpectomy) on Ibuprofen  600 mg q8 hourly for pain.  Doing well, pain well-controlled.  Following up with oncologist Dr. Rennie. Has an appointment with Dr. Lenn (rad/onc) post lumpectomy radiation consideration. Also recommended antiestrogen therapy for about 5 years. He plans on getting DEXA as well. 2 months follow up recommended during her last visit with him on 10/10/24.  - Abnormal liver enzymes:  Chronic with intermittent elevation in AST, ALT.  She underwent evaluation in 09/23/2017 evaluation into cause for elevated LFTs including hepatitis A, hepatitis B, hepatitis C screening, ceruloplasmin, alpha-1 antitrypsin screening, iron panel, negative ANA, mitochondrial, anti-Smith muscle antibody was normal.  She reports significantly changing in her diet which helped improve patient and her liver enzymes in 2018. She is mindful of her carbohydrate intake, particularly pasta, and does not consume soda.  - Pain, b/l knee:  She has a history of knee pain due to arthritis and has previously received cortisone shots. Post-breast lumpectomy surgery, she was prescribed ibuprofen , which helped with the knee pain.  She experiences occasional nausea, managed with Zofran  as needed, and regular constipation, for which she takes a laxative with a stool softener nightly. She is trying to increase her fiber  intake.  She has asthma/COPD and chronic hypoxia: She is on oxygen  2 L at night and 1 L during daytime when she is exacerbating.  She follows up with pulmonology for asthma COPD management.  She takes gabapentin  twice daily for diabetic neuropathy, which helps but does not completely alleviate her symptoms.  She takes metformin  for diabetes, two tablets in the morning and two in the evening. She monitors her blood sugar levels, which have been fluctuating slightly above her target range.  She reports experiencing pain during urination since her surgery, although there is no burning sensation or blood in her urine. She has a history of urinary tract infections.  She experiences hair loss, patchy spots on her scalp which she attributes to psoriasis. She has not seen dermatologist in the past for this.he reports being treated with ketoconazole     ROS As per HPI    Objective:     BP 130/66 (BP Location: Right Arm, Patient Position: Sitting)   Pulse 81   Temp 99 F (37.2 C) (Oral)   Ht 5' 6 (1.676 m)   Wt 240 lb 12.8 oz (109.2 kg)   LMP 04/10/2014   SpO2 95%   BMI 38.87 kg/m      10/26/2024    1:20 PM 10/10/2024    1:11 PM 08/19/2024   10:27 AM  Depression screen PHQ 2/9  Decreased Interest 0 0 0  Down, Depressed, Hopeless 0 0 0  PHQ - 2 Score 0 0 0  Altered sleeping 1  2  Tired, decreased energy 1  1  Change in appetite 1  1  Feeling bad or failure about yourself  0  0  Trouble concentrating 0  0  Moving slowly or fidgety/restless 0  0  Suicidal thoughts 0  0  PHQ-9 Score 3  4  Difficult doing work/chores   Somewhat difficult      10/26/2024    1:20 PM 07/06/2024   10:20 AM 01/22/2024   10:17 AM 10/26/2023   12:04 PM  GAD 7 : Generalized Anxiety Score  Nervous, Anxious, on Edge 0 0  0  0   Control/stop worrying 0 0  0  0   Worry too much - different things 0 0  0  0   Trouble relaxing 0 1  0  2   Restless 0 0  0  0   Easily annoyed or irritable 0 0  0  0   Afraid  - awful might happen 0 0  0  0   Total GAD 7 Score 0 1 0 2  Anxiety Difficulty Not difficult at all  Not difficult at all Not difficult at all     Data saved with a previous flowsheet row definition      10/26/2024    1:20 PM 10/10/2024    1:11 PM 08/19/2024   10:27 AM  Depression screen PHQ 2/9  Decreased Interest 0 0 0  Down, Depressed, Hopeless 0 0 0  PHQ - 2 Score 0 0 0  Altered sleeping 1  2  Tired, decreased energy 1  1  Change in appetite 1  1  Feeling bad or failure about yourself  0  0  Trouble concentrating 0  0  Moving slowly or fidgety/restless 0  0  Suicidal thoughts 0  0  PHQ-9 Score 3  4  Difficult doing work/chores   Somewhat difficult      10/26/2024    1:20 PM 07/06/2024   10:20 AM 01/22/2024   10:17 AM 10/26/2023   12:04 PM  GAD 7 : Generalized Anxiety Score  Nervous, Anxious, on Edge 0 0  0  0   Control/stop worrying 0 0  0  0   Worry too much - different things 0 0  0  0   Trouble relaxing 0 1  0  2   Restless 0 0  0  0   Easily annoyed or irritable 0 0  0  0   Afraid - awful might happen 0 0  0  0   Total GAD 7 Score 0 1 0 2  Anxiety Difficulty Not difficult at all  Not difficult at all Not difficult at all     Data saved with a previous flowsheet row definition   SDOH Screenings   Food Insecurity: No Food Insecurity (08/19/2024)  Housing: Low Risk (08/19/2024)  Transportation Needs: No Transportation Needs (08/19/2024)  Utilities: Not At Risk (08/19/2024)  Alcohol Screen: Low Risk (08/19/2024)  Depression (PHQ2-9): Low Risk (10/26/2024)  Financial Resource Strain: Low Risk (08/19/2024)  Physical Activity: Inactive (08/19/2024)  Social Connections: Moderately Isolated (08/19/2024)  Stress: Stress Concern Present (08/19/2024)  Tobacco Use: Medium Risk (10/26/2024)  Health Literacy: Adequate Health Literacy (08/19/2024)     Physical Exam Constitutional:      Appearance: Normal appearance. She is obese.  HENT:     Head: Normocephalic and  atraumatic.     Nose:     Comments: Using supplemental oxygen  via nasal cannula     Mouth/Throat:     Mouth: Mucous membranes are moist.  Neck:     Thyroid : No thyroid  mass or  thyroid  tenderness.  Cardiovascular:     Rate and Rhythm: Normal rate and regular rhythm.  Pulmonary:     Effort: Pulmonary effort is normal.     Breath sounds: Normal breath sounds.  Abdominal:     General: Bowel sounds are normal.     Palpations: Abdomen is soft.     Tenderness: There is no guarding.  Musculoskeletal:     Cervical back: Neck supple. No rigidity.     Right lower leg: No edema.     Left lower leg: No edema.  Skin:    General: Skin is warm.  Neurological:     Mental Status: She is alert and oriented to person, place, and time.  Psychiatric:        Mood and Affect: Mood normal.        Behavior: Behavior normal.        No results found for any visits on 10/26/24.  The ASCVD Risk score (Arnett DK, et al., 2019) failed to calculate for the following reasons:   The valid total cholesterol range is 130 to 320 mg/dL     Assessment & Plan:   Assessment & Plan Abnormal liver function Reviewed GI evaluation from 2018.  Suspect elevated LFTs secondary to fatty liver disease.  Check CMP today.  If LFT continues to be elevated recommend right upper quadrant ultrasound and referral to GI for further evaluation. Orders:   Comp Met (CMET)  Type 2 diabetes mellitus with diabetic neuropathy, without long-term current use of insulin  (HCC) Will check A1c today.  Continue metformin  1000 mg twice a day. Orders:   HgB A1c  Gastroesophageal reflux disease without esophagitis Recommend starting pantoprazole  40 mg daily when she takes meloxicam .  Refill sent to the pharmacy.  Suspect nausea might be related to GERD.  If worsens recommend seeing GI.  Previously seen by Dr. In 2018. Orders:   pantoprazole  (PROTONIX ) 40 MG tablet; Take 1 tablet (40 mg total) by mouth daily.  Nausea Intermittent, no  obvious triggers.  Happening once every couple of months.  Okay to continue taking Zofran  4 mg daily as needed for nausea. Orders:   ondansetron  (ZOFRAN ) 4 MG tablet; Take 1 tablet (4 mg total) by mouth daily as needed for nausea or vomiting.  Painful micturition Check UA to rule out UTI.  Management pending UA result. Orders:   Urinalysis, Routine w reflex microscopic  Chronic pain of both knees Chronic, previously treated with cortisone injection.  Due to recent diagnosis of breast cancer she is not following up with Ortho for knee injection.  Prescribed meloxicam  7.5 mg as needed daily for knee pain.  Advised against taking meloxicam  with other NSAIDs. Orders:   meloxicam  (MOBIC ) 7.5 MG tablet; Take 1 tablet (7.5 mg total) by mouth daily as needed for pain.  Dermatitis seborrheica Recommend using ketoconazole  shampoo twice a week.  Refill sent.  If symptoms worsens, patient notices worsening hair fall recommend dermatology evaluation.  Anticipate hair fall with upcoming radiation, breast cancer therapy. Orders:   ketoconazole  (NIZORAL ) 2 % shampoo; Apply 1 Application topically 2 (two) times a week.  Diastasis recti Presence of diastasis recti.  Discussed potential benefit of physical therapy for abdominal muscle strengthening exercise.  Patient not interested in PT at this time.  Will continue to monitor.    Malignant neoplasm of upper-outer quadrant of left breast in female, estrogen receptor positive (HCC) Continue follow-up with hematologist, radiation oncologist for further evaluation and management.      Return  in about 3 months (around 01/24/2025) for Chronic f/u.   Luke Shade, MD "

## 2024-10-26 NOTE — Assessment & Plan Note (Signed)
 Continue follow-up with hematologist, radiation oncologist for further evaluation and management.

## 2024-10-26 NOTE — Patient Instructions (Addendum)
 Increase fiber intake to about 20 grams per day. It's important to stay hydrated as well. Please cut down on carbohydrates to help with liver enzymes as well. If your liver enzymes continues to be elevated, we will do liver ultrasound and have you see Dr. Therisa at [facility].   I sent Meloxicam  7.5 mg to the pharmacy. Take this daily as needed for knee pain. Please do not take it with other NSAIDs like Advil  , Motrin , Aleve to help reduce side effects like acid reflux, kidney problems. On days you take Meloxicam  please take Protonix  40 mg daily as needed.

## 2024-10-26 NOTE — Assessment & Plan Note (Addendum)
 Presence of diastasis recti.  Discussed potential benefit of physical therapy for abdominal muscle strengthening exercise.  Patient not interested in PT at this time.  Will continue to monitor.

## 2024-10-26 NOTE — Assessment & Plan Note (Addendum)
 Chronic, previously treated with cortisone injection.  Due to recent diagnosis of breast cancer she is not following up with Ortho for knee injection.  Prescribed meloxicam  7.5 mg as needed daily for knee pain.  Advised against taking meloxicam  with other NSAIDs. Orders:   meloxicam  (MOBIC ) 7.5 MG tablet; Take 1 tablet (7.5 mg total) by mouth daily as needed for pain.

## 2024-10-26 NOTE — Assessment & Plan Note (Addendum)
 Recommend using ketoconazole  shampoo twice a week.  Refill sent.  If symptoms worsens, patient notices worsening hair fall recommend dermatology evaluation.  Anticipate hair fall with upcoming radiation, breast cancer therapy. Orders:   ketoconazole  (NIZORAL ) 2 % shampoo; Apply 1 Application topically 2 (two) times a week.

## 2024-10-27 ENCOUNTER — Ambulatory Visit: Payer: Self-pay

## 2024-10-27 ENCOUNTER — Inpatient Hospital Stay

## 2024-10-27 ENCOUNTER — Ambulatory Visit

## 2024-10-27 DIAGNOSIS — R945 Abnormal results of liver function studies: Secondary | ICD-10-CM

## 2024-10-27 LAB — COMPREHENSIVE METABOLIC PANEL WITH GFR
ALT: 56 U/L — ABNORMAL HIGH (ref 3–35)
AST: 84 U/L — ABNORMAL HIGH (ref 5–37)
Albumin: 4.4 g/dL (ref 3.5–5.2)
Alkaline Phosphatase: 76 U/L (ref 39–117)
BUN: 13 mg/dL (ref 6–23)
CO2: 36 meq/L — ABNORMAL HIGH (ref 19–32)
Calcium: 10 mg/dL (ref 8.4–10.5)
Chloride: 97 meq/L (ref 96–112)
Creatinine, Ser: 0.57 mg/dL (ref 0.40–1.20)
GFR: 98.98 mL/min
Glucose, Bld: 64 mg/dL — ABNORMAL LOW (ref 70–99)
Potassium: 4.1 meq/L (ref 3.5–5.1)
Sodium: 141 meq/L (ref 135–145)
Total Bilirubin: 0.4 mg/dL (ref 0.2–1.2)
Total Protein: 7.1 g/dL (ref 6.0–8.3)

## 2024-10-27 LAB — URINALYSIS, ROUTINE W REFLEX MICROSCOPIC
Bilirubin Urine: NEGATIVE
Hgb urine dipstick: NEGATIVE
Ketones, ur: NEGATIVE
Leukocytes,Ua: NEGATIVE
Nitrite: NEGATIVE
Specific Gravity, Urine: 1.01 (ref 1.000–1.030)
Total Protein, Urine: NEGATIVE
Urine Glucose: NEGATIVE
Urobilinogen, UA: 0.2 (ref 0.0–1.0)
pH: 6.5 (ref 5.0–8.0)

## 2024-10-27 LAB — HEMOGLOBIN A1C: Hgb A1c MFr Bld: 6.8 % — ABNORMAL HIGH (ref 4.6–6.5)

## 2024-10-27 NOTE — Telephone Encounter (Signed)
 Ketoconazole  shampoo was sent yesterday.   Luke Shade, MD

## 2024-10-27 NOTE — Progress Notes (Signed)
 Pharmacy has received and prescription is ready for pick up. Patient has been notified.

## 2024-10-27 NOTE — Progress Notes (Signed)
 Please call the patient to let her know lab shows stable glucose. Urine came back normal. Liver function continues to be elevated. Recommend referral to GI for further evaluation. Referral made to GI. If patient has not heard from GI to schedule an appointment in 2 weeks recommend reaching out to our clinic.   Martha Rotenberg, MD --- 1. Abnormal liver function (Primary) - Reassuring evaluation in 2018, suspected secondary to fatty liver disease.  - Ambulatory referral to Gastroenterology, previous patient of Dr. Derril may not cover for [facility] so referring to GI within [facility] system. Luke Shade, MD

## 2024-10-28 ENCOUNTER — Ambulatory Visit
Admission: RE | Admit: 2024-10-28 | Discharge: 2024-10-28 | Disposition: A | Source: Ambulatory Visit | Attending: Radiation Oncology | Admitting: Radiation Oncology

## 2024-10-31 ENCOUNTER — Ambulatory Visit

## 2024-10-31 ENCOUNTER — Inpatient Hospital Stay

## 2024-11-01 ENCOUNTER — Ambulatory Visit
Admission: RE | Admit: 2024-11-01 | Discharge: 2024-11-01 | Attending: Radiation Oncology | Admitting: Radiation Oncology

## 2024-11-01 ENCOUNTER — Other Ambulatory Visit: Payer: Self-pay

## 2024-11-01 LAB — RAD ONC ARIA SESSION SUMMARY
Course Elapsed Days: 0
Plan Fractions Treated to Date: 1
Plan Prescribed Dose Per Fraction: 3.5 Gy
Plan Total Fractions Prescribed: 10
Plan Total Prescribed Dose: 35 Gy
Reference Point Dosage Given to Date: 3.5 Gy
Reference Point Session Dosage Given: 3.5 Gy
Session Number: 1

## 2024-11-02 ENCOUNTER — Other Ambulatory Visit: Payer: Self-pay

## 2024-11-02 ENCOUNTER — Ambulatory Visit
Admission: RE | Admit: 2024-11-02 | Discharge: 2024-11-02 | Attending: Radiation Oncology | Admitting: Radiation Oncology

## 2024-11-02 LAB — RAD ONC ARIA SESSION SUMMARY
Course Elapsed Days: 1
Plan Fractions Treated to Date: 2
Plan Prescribed Dose Per Fraction: 3.5 Gy
Plan Total Fractions Prescribed: 10
Plan Total Prescribed Dose: 35 Gy
Reference Point Dosage Given to Date: 7 Gy
Reference Point Session Dosage Given: 3.5 Gy
Session Number: 2

## 2024-11-03 ENCOUNTER — Other Ambulatory Visit: Payer: Self-pay

## 2024-11-03 ENCOUNTER — Ambulatory Visit
Admission: RE | Admit: 2024-11-03 | Discharge: 2024-11-03 | Attending: Radiation Oncology | Admitting: Radiation Oncology

## 2024-11-03 LAB — RAD ONC ARIA SESSION SUMMARY
Course Elapsed Days: 2
Plan Fractions Treated to Date: 3
Plan Prescribed Dose Per Fraction: 3.5 Gy
Plan Total Fractions Prescribed: 10
Plan Total Prescribed Dose: 35 Gy
Reference Point Dosage Given to Date: 10.5 Gy
Reference Point Session Dosage Given: 3.5 Gy
Session Number: 3

## 2024-11-04 ENCOUNTER — Inpatient Hospital Stay: Attending: Internal Medicine

## 2024-11-04 ENCOUNTER — Ambulatory Visit

## 2024-11-04 ENCOUNTER — Other Ambulatory Visit: Payer: Self-pay

## 2024-11-04 ENCOUNTER — Other Ambulatory Visit: Payer: Self-pay | Admitting: *Deleted

## 2024-11-04 DIAGNOSIS — C50412 Malignant neoplasm of upper-outer quadrant of left female breast: Secondary | ICD-10-CM

## 2024-11-04 LAB — CBC (CANCER CENTER ONLY)
HCT: 43.6 % (ref 36.0–46.0)
Hemoglobin: 13.6 g/dL (ref 12.0–15.0)
MCH: 27.3 pg (ref 26.0–34.0)
MCHC: 31.2 g/dL (ref 30.0–36.0)
MCV: 87.6 fL (ref 80.0–100.0)
Platelet Count: 242 10*3/uL (ref 150–400)
RBC: 4.98 MIL/uL (ref 3.87–5.11)
RDW: 13.8 % (ref 11.5–15.5)
WBC Count: 5.6 10*3/uL (ref 4.0–10.5)
nRBC: 0 % (ref 0.0–0.2)

## 2024-11-04 LAB — RAD ONC ARIA SESSION SUMMARY
Course Elapsed Days: 3
Plan Fractions Treated to Date: 4
Plan Prescribed Dose Per Fraction: 3.5 Gy
Plan Total Fractions Prescribed: 10
Plan Total Prescribed Dose: 35 Gy
Reference Point Dosage Given to Date: 14 Gy
Reference Point Session Dosage Given: 3.5 Gy
Session Number: 4

## 2024-11-07 ENCOUNTER — Ambulatory Visit

## 2024-11-08 ENCOUNTER — Ambulatory Visit

## 2024-11-09 ENCOUNTER — Ambulatory Visit

## 2024-11-10 ENCOUNTER — Ambulatory Visit

## 2024-11-11 ENCOUNTER — Ambulatory Visit

## 2024-11-14 ENCOUNTER — Ambulatory Visit

## 2024-11-28 ENCOUNTER — Ambulatory Visit: Admitting: Pulmonary Disease

## 2024-12-09 ENCOUNTER — Inpatient Hospital Stay

## 2024-12-09 ENCOUNTER — Inpatient Hospital Stay: Admitting: Internal Medicine

## 2025-01-12 ENCOUNTER — Inpatient Hospital Stay: Admitting: *Deleted

## 2025-01-26 ENCOUNTER — Ambulatory Visit

## 2025-08-28 ENCOUNTER — Ambulatory Visit
